# Patient Record
Sex: Male | Born: 1945 | Race: White | Hispanic: No | Marital: Married | Smoking: Former smoker
Health system: Southern US, Community
[De-identification: ages and names within clinical notes are randomized; demographics above are authoritative.]

## PROBLEM LIST (undated history)

## (undated) DIAGNOSIS — J189 Pneumonia, unspecified organism: Secondary | ICD-10-CM

## (undated) DIAGNOSIS — B159 Hepatitis A without hepatic coma: Secondary | ICD-10-CM

## (undated) DIAGNOSIS — C61 Malignant neoplasm of prostate: Secondary | ICD-10-CM

## (undated) DIAGNOSIS — E785 Hyperlipidemia, unspecified: Secondary | ICD-10-CM

## (undated) DIAGNOSIS — M1990 Unspecified osteoarthritis, unspecified site: Secondary | ICD-10-CM

## (undated) DIAGNOSIS — R0600 Dyspnea, unspecified: Secondary | ICD-10-CM

## (undated) DIAGNOSIS — I4819 Other persistent atrial fibrillation: Secondary | ICD-10-CM

## (undated) DIAGNOSIS — J111 Influenza due to unidentified influenza virus with other respiratory manifestations: Secondary | ICD-10-CM

## (undated) DIAGNOSIS — R001 Bradycardia, unspecified: Secondary | ICD-10-CM

## (undated) DIAGNOSIS — K219 Gastro-esophageal reflux disease without esophagitis: Secondary | ICD-10-CM

## (undated) DIAGNOSIS — Z950 Presence of cardiac pacemaker: Secondary | ICD-10-CM

## (undated) HISTORY — PX: APPENDECTOMY: SHX54

## (undated) HISTORY — DX: Other persistent atrial fibrillation: I48.19

## (undated) HISTORY — PX: WRIST FRACTURE SURGERY: SHX121

## (undated) HISTORY — DX: Hyperlipidemia, unspecified: E78.5

## (undated) HISTORY — PX: FRACTURE SURGERY: SHX138

---

## 2005-07-19 ENCOUNTER — Inpatient Hospital Stay (HOSPITAL_COMMUNITY): Admission: EM | Admit: 2005-07-19 | Discharge: 2005-07-20 | Payer: Self-pay | Admitting: Emergency Medicine

## 2005-07-19 IMAGING — CR DG CHEST 1V PORT
1 series · 1 of 1 positions shown · non-contrast
Comparison: None.

CLINICAL DATA: 60-year-old male with chest pain.  
 PORTABLE CHEST - 1 VIEW:

[view not recorded]
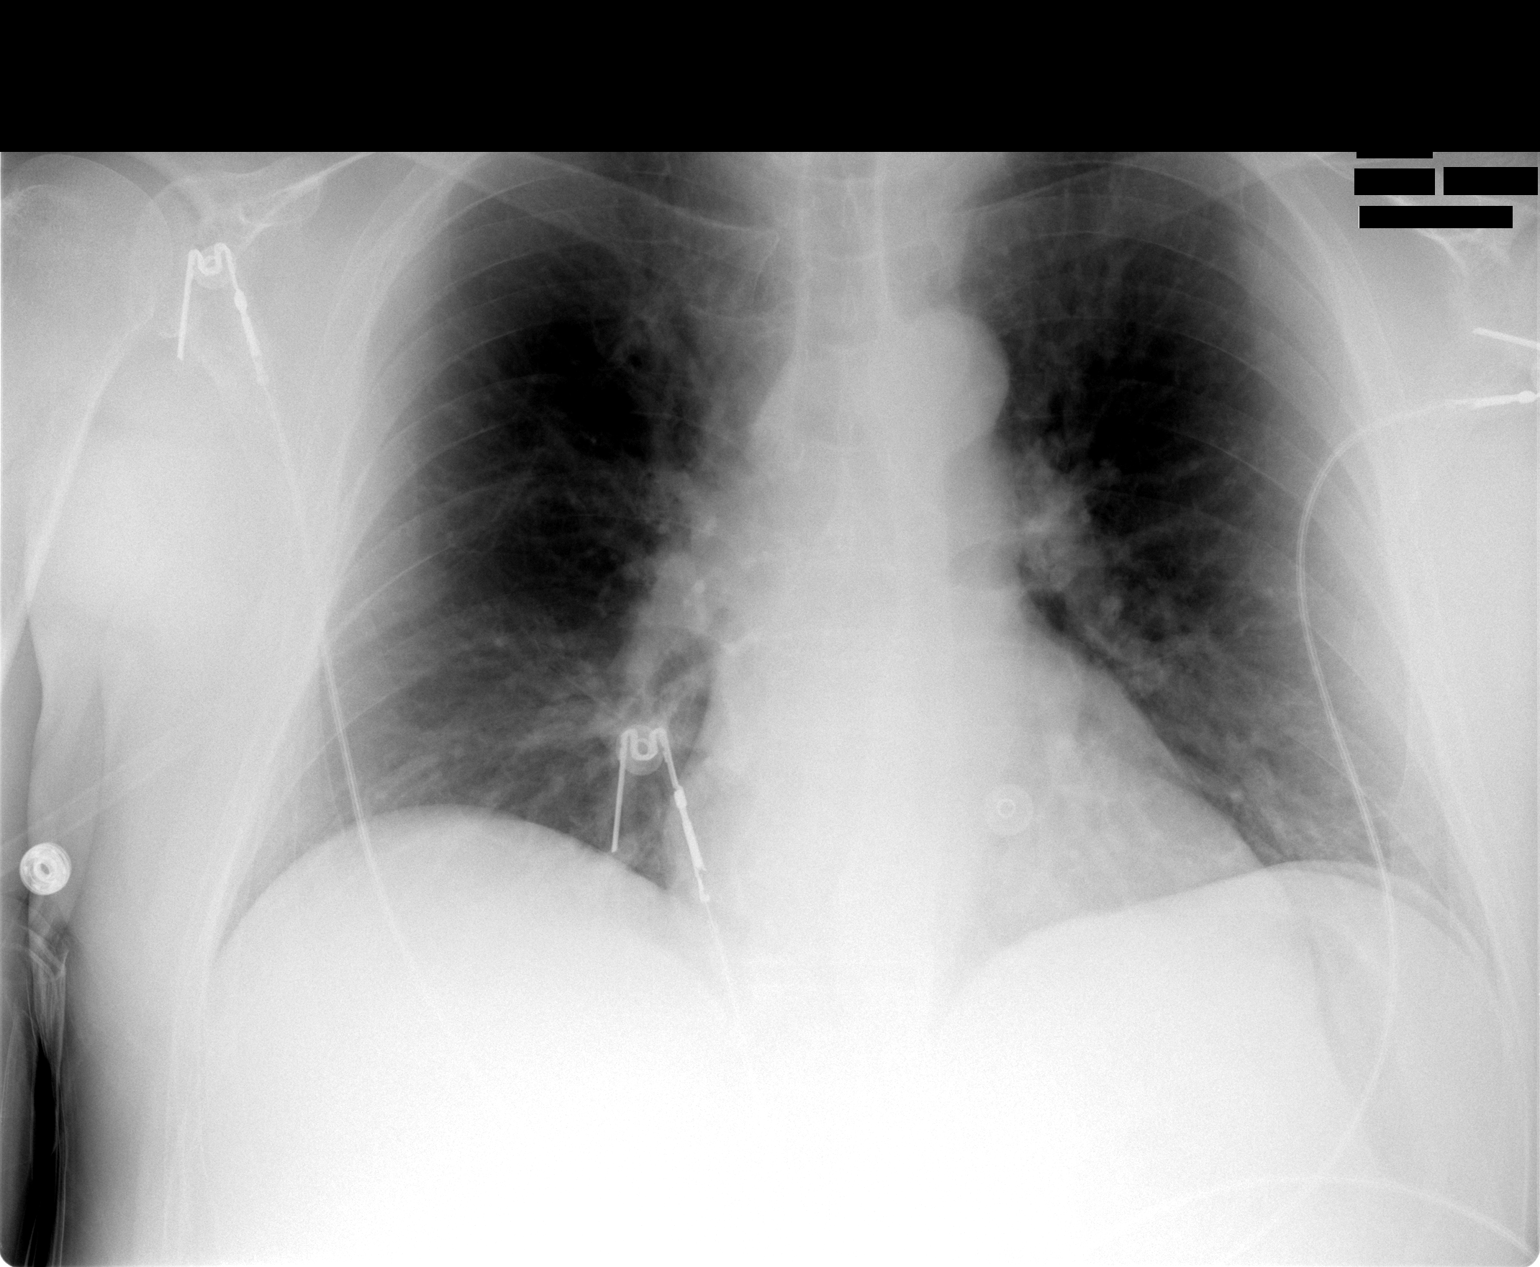

[1 of 1 positions shown; findings below may reference images not displayed]

FINDINGS: Cardiopericardial silhouette is within normal limits for size.  There is mild prominence of the pulmonary vasculature without frank edema.  No focal air space disease is seen.  Findings are exaggerated by low lung volumes.
IMPRESSION: 1.  No acute cardiopulmonary disease.
 2.  Mild cephalization of the pulmonary veins.  While this may represent early pulmonary vascular congestion, the findings are likely exaggerated by low lung volumes.

## 2005-07-20 IMAGING — CR DG CHEST 1V
1 series · 1 of 1 positions shown · non-contrast
Comparison: [DATE].

CLINICAL DATA: Myocardial infarction.  Post-V-Q scan. 
 CHEST ? 1 VIEW:

[view not recorded]
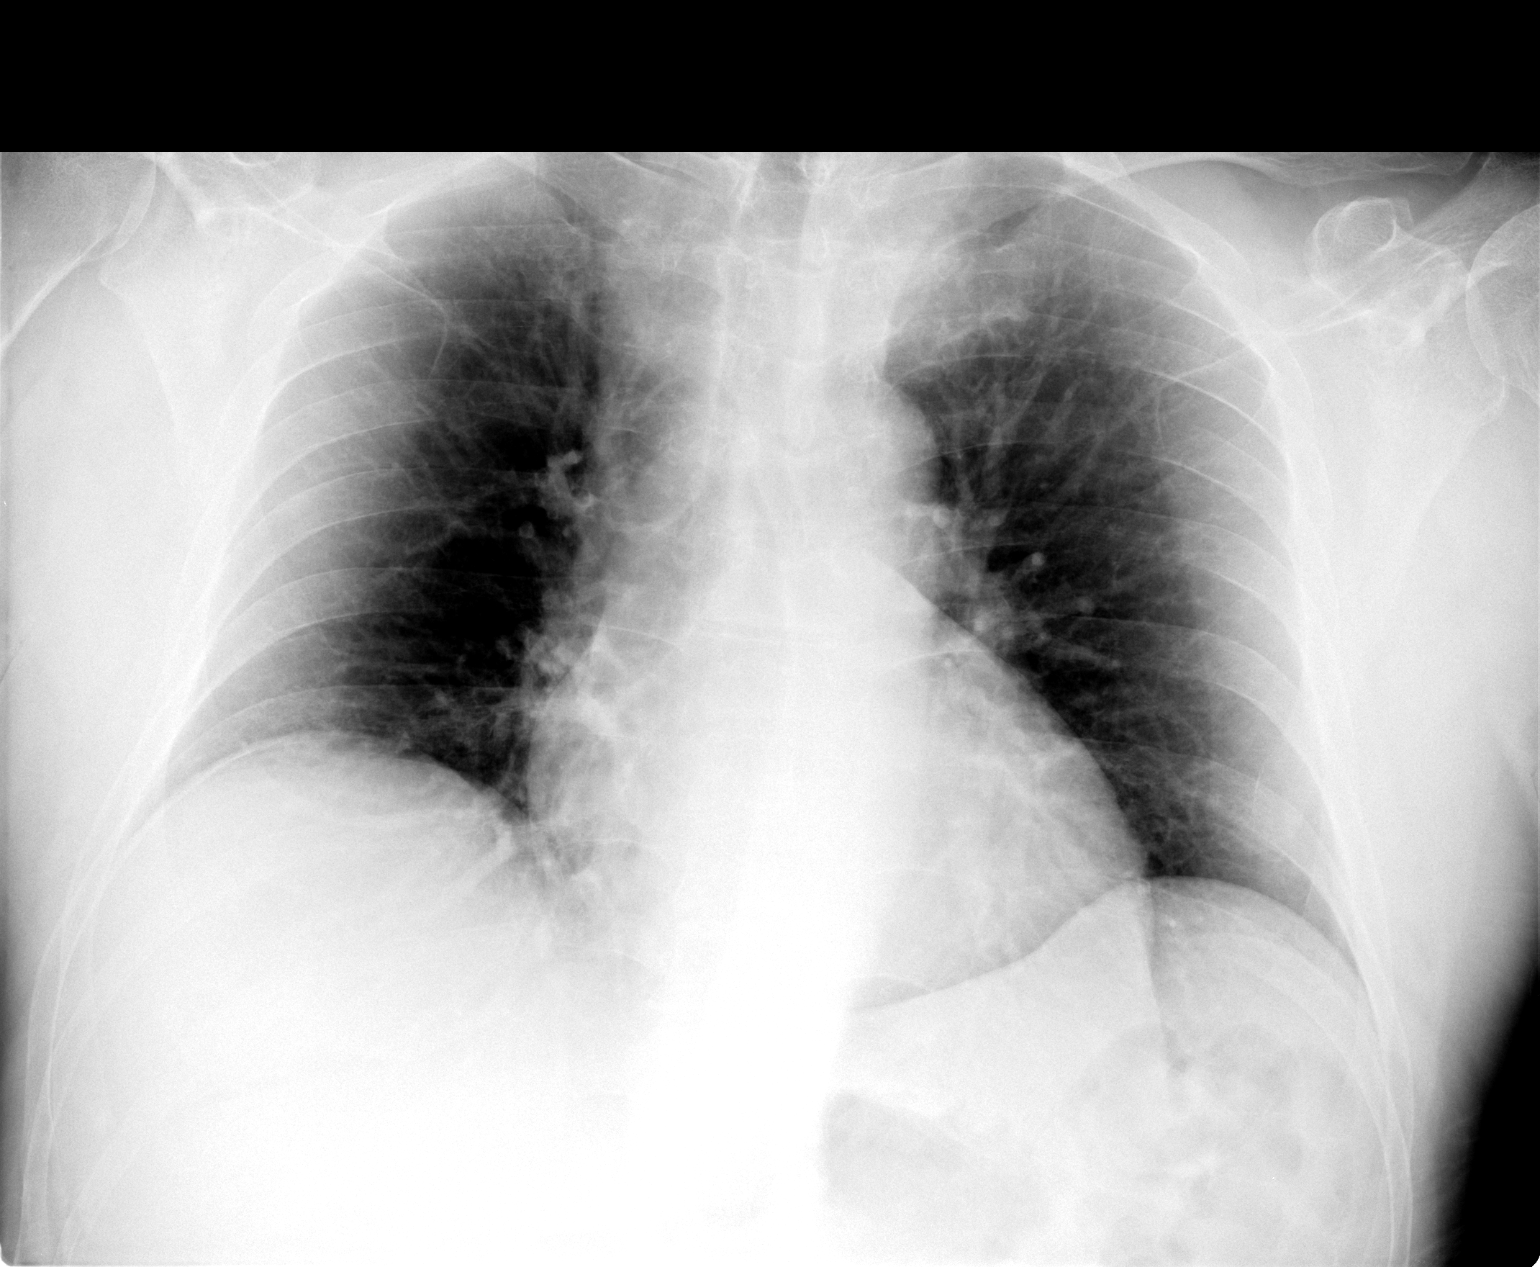

[1 of 1 positions shown; findings below may reference images not displayed]

FINDINGS: Heart size stable.  Lungs are low in volume but clear.
IMPRESSION: No acute findings.

## 2011-03-26 DIAGNOSIS — G4700 Insomnia, unspecified: Secondary | ICD-10-CM | POA: Diagnosis not present

## 2011-03-26 DIAGNOSIS — E7800 Pure hypercholesterolemia, unspecified: Secondary | ICD-10-CM | POA: Diagnosis not present

## 2011-03-26 DIAGNOSIS — K219 Gastro-esophageal reflux disease without esophagitis: Secondary | ICD-10-CM | POA: Diagnosis not present

## 2011-03-26 DIAGNOSIS — E559 Vitamin D deficiency, unspecified: Secondary | ICD-10-CM | POA: Diagnosis not present

## 2011-03-26 DIAGNOSIS — M1990 Unspecified osteoarthritis, unspecified site: Secondary | ICD-10-CM | POA: Diagnosis not present

## 2011-03-26 DIAGNOSIS — I8390 Asymptomatic varicose veins of unspecified lower extremity: Secondary | ICD-10-CM | POA: Diagnosis not present

## 2011-03-26 DIAGNOSIS — L989 Disorder of the skin and subcutaneous tissue, unspecified: Secondary | ICD-10-CM | POA: Diagnosis not present

## 2011-03-26 DIAGNOSIS — Z79899 Other long term (current) drug therapy: Secondary | ICD-10-CM | POA: Diagnosis not present

## 2011-03-26 DIAGNOSIS — L719 Rosacea, unspecified: Secondary | ICD-10-CM | POA: Diagnosis not present

## 2011-04-23 DIAGNOSIS — M2010 Hallux valgus (acquired), unspecified foot: Secondary | ICD-10-CM | POA: Diagnosis not present

## 2011-04-23 DIAGNOSIS — M2040 Other hammer toe(s) (acquired), unspecified foot: Secondary | ICD-10-CM | POA: Diagnosis not present

## 2011-05-04 DIAGNOSIS — H1130 Conjunctival hemorrhage, unspecified eye: Secondary | ICD-10-CM | POA: Diagnosis not present

## 2011-06-01 DIAGNOSIS — R062 Wheezing: Secondary | ICD-10-CM | POA: Diagnosis not present

## 2011-08-25 DIAGNOSIS — L578 Other skin changes due to chronic exposure to nonionizing radiation: Secondary | ICD-10-CM | POA: Diagnosis not present

## 2011-08-25 DIAGNOSIS — D2370 Other benign neoplasm of skin of unspecified lower limb, including hip: Secondary | ICD-10-CM | POA: Diagnosis not present

## 2011-08-25 DIAGNOSIS — L910 Hypertrophic scar: Secondary | ICD-10-CM | POA: Diagnosis not present

## 2011-08-25 DIAGNOSIS — L821 Other seborrheic keratosis: Secondary | ICD-10-CM | POA: Diagnosis not present

## 2011-08-25 DIAGNOSIS — L820 Inflamed seborrheic keratosis: Secondary | ICD-10-CM | POA: Diagnosis not present

## 2011-08-25 DIAGNOSIS — D485 Neoplasm of uncertain behavior of skin: Secondary | ICD-10-CM | POA: Diagnosis not present

## 2011-08-25 DIAGNOSIS — D239 Other benign neoplasm of skin, unspecified: Secondary | ICD-10-CM | POA: Diagnosis not present

## 2012-01-12 DIAGNOSIS — D234 Other benign neoplasm of skin of scalp and neck: Secondary | ICD-10-CM | POA: Diagnosis not present

## 2012-01-12 DIAGNOSIS — D485 Neoplasm of uncertain behavior of skin: Secondary | ICD-10-CM | POA: Diagnosis not present

## 2012-01-12 DIAGNOSIS — L909 Atrophic disorder of skin, unspecified: Secondary | ICD-10-CM | POA: Diagnosis not present

## 2012-03-31 DIAGNOSIS — I8390 Asymptomatic varicose veins of unspecified lower extremity: Secondary | ICD-10-CM | POA: Diagnosis not present

## 2012-03-31 DIAGNOSIS — E7800 Pure hypercholesterolemia, unspecified: Secondary | ICD-10-CM | POA: Diagnosis not present

## 2012-03-31 DIAGNOSIS — M1990 Unspecified osteoarthritis, unspecified site: Secondary | ICD-10-CM | POA: Diagnosis not present

## 2012-03-31 DIAGNOSIS — Z79899 Other long term (current) drug therapy: Secondary | ICD-10-CM | POA: Diagnosis not present

## 2012-03-31 DIAGNOSIS — L719 Rosacea, unspecified: Secondary | ICD-10-CM | POA: Diagnosis not present

## 2012-03-31 DIAGNOSIS — Z0000 Encounter for general adult medical examination without abnormal findings: Secondary | ICD-10-CM | POA: Diagnosis not present

## 2012-03-31 DIAGNOSIS — E559 Vitamin D deficiency, unspecified: Secondary | ICD-10-CM | POA: Diagnosis not present

## 2012-03-31 DIAGNOSIS — Z125 Encounter for screening for malignant neoplasm of prostate: Secondary | ICD-10-CM | POA: Diagnosis not present

## 2012-03-31 DIAGNOSIS — Z1331 Encounter for screening for depression: Secondary | ICD-10-CM | POA: Diagnosis not present

## 2012-04-18 DIAGNOSIS — M1710 Unilateral primary osteoarthritis, unspecified knee: Secondary | ICD-10-CM | POA: Diagnosis not present

## 2012-04-18 DIAGNOSIS — M5137 Other intervertebral disc degeneration, lumbosacral region: Secondary | ICD-10-CM | POA: Diagnosis not present

## 2012-05-18 DIAGNOSIS — M1710 Unilateral primary osteoarthritis, unspecified knee: Secondary | ICD-10-CM | POA: Diagnosis not present

## 2012-06-07 DIAGNOSIS — M1710 Unilateral primary osteoarthritis, unspecified knee: Secondary | ICD-10-CM | POA: Diagnosis not present

## 2012-08-10 DIAGNOSIS — IMO0002 Reserved for concepts with insufficient information to code with codable children: Secondary | ICD-10-CM | POA: Diagnosis not present

## 2012-08-23 DIAGNOSIS — L821 Other seborrheic keratosis: Secondary | ICD-10-CM | POA: Diagnosis not present

## 2012-08-23 DIAGNOSIS — L578 Other skin changes due to chronic exposure to nonionizing radiation: Secondary | ICD-10-CM | POA: Diagnosis not present

## 2012-08-23 DIAGNOSIS — L819 Disorder of pigmentation, unspecified: Secondary | ICD-10-CM | POA: Diagnosis not present

## 2012-08-23 DIAGNOSIS — D485 Neoplasm of uncertain behavior of skin: Secondary | ICD-10-CM | POA: Diagnosis not present

## 2012-08-23 DIAGNOSIS — D1801 Hemangioma of skin and subcutaneous tissue: Secondary | ICD-10-CM | POA: Diagnosis not present

## 2012-08-23 DIAGNOSIS — L723 Sebaceous cyst: Secondary | ICD-10-CM | POA: Diagnosis not present

## 2012-11-29 ENCOUNTER — Encounter (HOSPITAL_COMMUNITY): Payer: Self-pay | Admitting: *Deleted

## 2012-11-29 ENCOUNTER — Other Ambulatory Visit: Payer: Self-pay | Admitting: Gastroenterology

## 2012-12-01 ENCOUNTER — Encounter (HOSPITAL_COMMUNITY): Payer: Self-pay | Admitting: Pharmacy Technician

## 2012-12-13 ENCOUNTER — Encounter (HOSPITAL_COMMUNITY): Payer: Self-pay | Admitting: Anesthesiology

## 2012-12-13 ENCOUNTER — Ambulatory Visit (HOSPITAL_COMMUNITY)
Admission: RE | Admit: 2012-12-13 | Discharge: 2012-12-13 | Disposition: A | Discharge status: Home / Self Care | Payer: Medicare Other | Source: Ambulatory Visit | Attending: Gastroenterology | Admitting: Gastroenterology

## 2012-12-13 ENCOUNTER — Encounter (HOSPITAL_COMMUNITY)
Admission: RE | Disposition: A | Discharge status: Home / Self Care | Payer: Self-pay | Source: Ambulatory Visit | Attending: Gastroenterology

## 2012-12-13 ENCOUNTER — Ambulatory Visit (HOSPITAL_COMMUNITY): Payer: Medicare Other | Admitting: Anesthesiology

## 2012-12-13 DIAGNOSIS — E7800 Pure hypercholesterolemia, unspecified: Secondary | ICD-10-CM | POA: Diagnosis not present

## 2012-12-13 DIAGNOSIS — K5730 Diverticulosis of large intestine without perforation or abscess without bleeding: Secondary | ICD-10-CM | POA: Insufficient documentation

## 2012-12-13 DIAGNOSIS — Z09 Encounter for follow-up examination after completed treatment for conditions other than malignant neoplasm: Secondary | ICD-10-CM | POA: Diagnosis not present

## 2012-12-13 DIAGNOSIS — K5732 Diverticulitis of large intestine without perforation or abscess without bleeding: Secondary | ICD-10-CM | POA: Diagnosis not present

## 2012-12-13 DIAGNOSIS — Z860100 Personal history of colon polyps, unspecified: Secondary | ICD-10-CM | POA: Insufficient documentation

## 2012-12-13 DIAGNOSIS — Z1211 Encounter for screening for malignant neoplasm of colon: Secondary | ICD-10-CM | POA: Diagnosis not present

## 2012-12-13 DIAGNOSIS — K219 Gastro-esophageal reflux disease without esophagitis: Secondary | ICD-10-CM | POA: Insufficient documentation

## 2012-12-13 HISTORY — PX: COLONOSCOPY WITH PROPOFOL: SHX5780

## 2012-12-13 HISTORY — DX: Unspecified osteoarthritis, unspecified site: M19.90

## 2012-12-13 HISTORY — DX: Gastro-esophageal reflux disease without esophagitis: K21.9

## 2012-12-13 SURGERY — COLONOSCOPY WITH PROPOFOL
Anesthesia: Monitor Anesthesia Care

## 2012-12-13 MED ORDER — PROPOFOL INFUSION 10 MG/ML OPTIME
INTRAVENOUS | Status: DC | PRN
  Administered 2012-12-13: 140 ug/kg/min via INTRAVENOUS

## 2012-12-13 MED ORDER — FENTANYL CITRATE 0.05 MG/ML IJ SOLN
INTRAMUSCULAR | Status: DC | PRN
  Administered 2012-12-13 (×2): 50 ug via INTRAVENOUS

## 2012-12-13 MED ORDER — LACTATED RINGERS IV SOLN
INTRAVENOUS | Status: DC
  Administered 2012-12-13: 1000 mL via INTRAVENOUS

## 2012-12-13 MED ORDER — SODIUM CHLORIDE 0.9 % IV SOLN: INTRAVENOUS | Status: DC

## 2012-12-13 MED ORDER — KETAMINE HCL 10 MG/ML IJ SOLN
INTRAMUSCULAR | Status: DC | PRN
  Administered 2012-12-13: 20 mg via INTRAVENOUS

## 2012-12-13 MED ORDER — MIDAZOLAM HCL 5 MG/5ML IJ SOLN
INTRAMUSCULAR | Status: DC | PRN
  Administered 2012-12-13 (×2): 1 mg via INTRAVENOUS

## 2012-12-13 SURGICAL SUPPLY — 22 items

## 2012-12-13 NOTE — Op Note (Signed)
Procedure: Surveillance colonoscopy  Endoscopist: Danise Edge  Premedication: Propofol administered by anesthesia  Procedure: The patient was placed in the left lateral decubitus position. Anal inspection and digital rectal exam were normal. The Pentax pediatric colonoscope was introduced into the rectum and advanced to the cecum. A normal-appearing appendiceal orifice and ileocecal valve were identified. Colonic preparation for the exam today was good.  Rectum. Normal. Retroflex view of the distal rectum normal.  Sigmoid colon and descending colon. Left colonic diverticulosis  Splenic flexure. Normal.  Transverse colon. Normal.  Hepatic flexure. Normal.  Ascending colon. Normal.  Cecum and ileocecal valve. Normal.  Assessment: Normal surveillance proctocolonoscopy to the cecum  Recommendation: Schedule surveillance colonoscopy in 5 years.

## 2012-12-13 NOTE — Preoperative (Signed)
Beta Blockers   Reason not to administer Beta Blockers:Not Applicable 

## 2012-12-13 NOTE — Transfer of Care (Signed)
Immediate Anesthesia Transfer of Care Note  Patient: Todd Chapman Drilling  Procedure(s) Performed: Procedure(s): COLONOSCOPY WITH PROPOFOL (N/A)  Patient Location: PACU  Anesthesia Type:MAC  Level of Consciousness: sedated  Airway & Oxygen Therapy: Patient Spontanous Breathing and Patient connected to face mask oxygen  Post-op Assessment: Report given to PACU RN and Post -op Vital signs reviewed and stable  Post vital signs: Reviewed and stable  Complications: No apparent anesthesia complications

## 2012-12-13 NOTE — Anesthesia Preprocedure Evaluation (Addendum)
Anesthesia Evaluation  Patient identified by MRN, date of birth, ID band Patient awake    Reviewed: Allergy & Precautions, H&P , NPO status , Patient's Chart, lab work & pertinent test results  Airway Mallampati: II TM Distance: >3 FB Neck ROM: Full    Dental  (+) Dental Advisory Given and Teeth Intact   Pulmonary neg pulmonary ROS,  breath sounds clear to auscultation        Cardiovascular negative cardio ROS  Rhythm:Regular Rate:Normal     Neuro/Psych negative neurological ROS  negative psych ROS   GI/Hepatic Neg liver ROS, GERD-  Medicated,  Endo/Other  negative endocrine ROS  Renal/GU negative Renal ROS     Musculoskeletal negative musculoskeletal ROS (+)   Abdominal   Peds  Hematology negative hematology ROS (+)   Anesthesia Other Findings   Reproductive/Obstetrics negative OB ROS                          Anesthesia Physical Anesthesia Plan  ASA: II  Anesthesia Plan: MAC   Post-op Pain Management:    Induction: Intravenous  Airway Management Planned:   Additional Equipment:   Intra-op Plan:   Post-operative Plan:   Informed Consent: I have reviewed the patients History and Physical, chart, labs and discussed the procedure including the risks, benefits and alternatives for the proposed anesthesia with the patient or authorized representative who has indicated his/her understanding and acceptance.   Dental advisory given  Plan Discussed with: CRNA  Anesthesia Plan Comments:         Anesthesia Quick Evaluation

## 2012-12-13 NOTE — H&P (Signed)
  Problem: Surveillance colonoscopy. Adenomatous colon polyp removed in 2006. Normal surveillance colonoscopy in 2009.  History: The patient is a 67 year old male born Jun 13, 1945. He underwent a colonoscopy in 2006 with removal of a tubular adenomatous colon polyp. He underwent a normal surveillance colonoscopy in 2009.  The patient is scheduled to undergo a surveillance colonoscopy today.  Medication allergies: Lipitor causes diarrhea. Zyrtec causes myalgias.  Past medical history: Hypercholesterolemia. Chronic tinnitus. Degenerative joint disease of the knees. Rosacea. Colonic diverticulosis. Melanosis coli. Gastroesophageal reflux. Chronic insomnia. History of adenomatous colon polyps. Appendectomy. Left wrist surgery.  Exam: The patient is alert and lying comfortably on the endoscopy stretcher. Abdomen is soft and nontender to palpation. Lungs are clear to auscultation. Cardiac exam reveals a regular rhythm.  Plan: Proceed with surveillance colonoscopy using propofol sedation.

## 2012-12-13 NOTE — Anesthesia Postprocedure Evaluation (Signed)
Anesthesia Post Note  Patient: Todd Chapman  Procedure(s) Performed: Procedure(s) (LRB): COLONOSCOPY WITH PROPOFOL (N/A)  Anesthesia type: MAC  Patient location: PACU  Post pain: Pain level controlled  Post assessment: Post-op Vital signs reviewed  Last Vitals: BP 97/54  Pulse 57  Temp(Src) 36.9 C (Oral)  Resp 16  Ht 6\' 1"  (1.854 m)  Wt 214 lb (97.07 kg)  BMI 28.24 kg/m2  SpO2 99%  Post vital signs: Reviewed  Level of consciousness: awake  Complications: No apparent anesthesia complications

## 2012-12-14 ENCOUNTER — Encounter (HOSPITAL_COMMUNITY): Payer: Self-pay | Admitting: Gastroenterology

## 2013-04-06 DIAGNOSIS — J069 Acute upper respiratory infection, unspecified: Secondary | ICD-10-CM | POA: Diagnosis not present

## 2013-04-13 DIAGNOSIS — L821 Other seborrheic keratosis: Secondary | ICD-10-CM | POA: Diagnosis not present

## 2013-04-17 DIAGNOSIS — E559 Vitamin D deficiency, unspecified: Secondary | ICD-10-CM | POA: Diagnosis not present

## 2013-04-17 DIAGNOSIS — L719 Rosacea, unspecified: Secondary | ICD-10-CM | POA: Diagnosis not present

## 2013-04-17 DIAGNOSIS — Z1331 Encounter for screening for depression: Secondary | ICD-10-CM | POA: Diagnosis not present

## 2013-04-17 DIAGNOSIS — I8390 Asymptomatic varicose veins of unspecified lower extremity: Secondary | ICD-10-CM | POA: Diagnosis not present

## 2013-04-17 DIAGNOSIS — Z0000 Encounter for general adult medical examination without abnormal findings: Secondary | ICD-10-CM | POA: Diagnosis not present

## 2013-04-17 DIAGNOSIS — Z125 Encounter for screening for malignant neoplasm of prostate: Secondary | ICD-10-CM | POA: Diagnosis not present

## 2013-04-17 DIAGNOSIS — J40 Bronchitis, not specified as acute or chronic: Secondary | ICD-10-CM | POA: Diagnosis not present

## 2013-04-17 DIAGNOSIS — Z79899 Other long term (current) drug therapy: Secondary | ICD-10-CM | POA: Diagnosis not present

## 2013-04-17 DIAGNOSIS — M1990 Unspecified osteoarthritis, unspecified site: Secondary | ICD-10-CM | POA: Diagnosis not present

## 2013-04-17 DIAGNOSIS — E7800 Pure hypercholesterolemia, unspecified: Secondary | ICD-10-CM | POA: Diagnosis not present

## 2013-04-17 DIAGNOSIS — Z23 Encounter for immunization: Secondary | ICD-10-CM | POA: Diagnosis not present

## 2013-04-17 DIAGNOSIS — K219 Gastro-esophageal reflux disease without esophagitis: Secondary | ICD-10-CM | POA: Diagnosis not present

## 2013-08-29 DIAGNOSIS — D239 Other benign neoplasm of skin, unspecified: Secondary | ICD-10-CM | POA: Diagnosis not present

## 2013-08-29 DIAGNOSIS — L821 Other seborrheic keratosis: Secondary | ICD-10-CM | POA: Diagnosis not present

## 2013-08-29 DIAGNOSIS — D2370 Other benign neoplasm of skin of unspecified lower limb, including hip: Secondary | ICD-10-CM | POA: Diagnosis not present

## 2013-08-29 DIAGNOSIS — D1801 Hemangioma of skin and subcutaneous tissue: Secondary | ICD-10-CM | POA: Diagnosis not present

## 2013-08-29 DIAGNOSIS — D485 Neoplasm of uncertain behavior of skin: Secondary | ICD-10-CM | POA: Diagnosis not present

## 2013-08-29 DIAGNOSIS — L570 Actinic keratosis: Secondary | ICD-10-CM | POA: Diagnosis not present

## 2013-08-29 DIAGNOSIS — I789 Disease of capillaries, unspecified: Secondary | ICD-10-CM | POA: Diagnosis not present

## 2013-09-14 DIAGNOSIS — J40 Bronchitis, not specified as acute or chronic: Secondary | ICD-10-CM | POA: Diagnosis not present

## 2014-04-24 DIAGNOSIS — E559 Vitamin D deficiency, unspecified: Secondary | ICD-10-CM | POA: Insufficient documentation

## 2014-04-24 DIAGNOSIS — Z86010 Personal history of colonic polyps: Secondary | ICD-10-CM | POA: Diagnosis not present

## 2014-04-24 DIAGNOSIS — Z0001 Encounter for general adult medical examination with abnormal findings: Secondary | ICD-10-CM | POA: Insufficient documentation

## 2014-04-24 DIAGNOSIS — K5730 Diverticulosis of large intestine without perforation or abscess without bleeding: Secondary | ICD-10-CM | POA: Diagnosis not present

## 2014-04-24 DIAGNOSIS — I8390 Asymptomatic varicose veins of unspecified lower extremity: Secondary | ICD-10-CM | POA: Diagnosis not present

## 2014-04-24 DIAGNOSIS — Z79899 Other long term (current) drug therapy: Secondary | ICD-10-CM | POA: Diagnosis not present

## 2014-04-24 DIAGNOSIS — K219 Gastro-esophageal reflux disease without esophagitis: Secondary | ICD-10-CM | POA: Diagnosis not present

## 2014-04-24 DIAGNOSIS — Z1389 Encounter for screening for other disorder: Secondary | ICD-10-CM | POA: Diagnosis not present

## 2014-04-24 DIAGNOSIS — E785 Hyperlipidemia, unspecified: Secondary | ICD-10-CM | POA: Diagnosis not present

## 2014-04-24 DIAGNOSIS — R001 Bradycardia, unspecified: Secondary | ICD-10-CM | POA: Diagnosis not present

## 2014-04-24 DIAGNOSIS — H269 Unspecified cataract: Secondary | ICD-10-CM | POA: Diagnosis not present

## 2014-04-24 DIAGNOSIS — Z23 Encounter for immunization: Secondary | ICD-10-CM | POA: Diagnosis not present

## 2014-04-24 HISTORY — DX: Vitamin D deficiency, unspecified: E55.9

## 2014-05-28 ENCOUNTER — Encounter: Payer: Self-pay | Admitting: *Deleted

## 2014-05-29 ENCOUNTER — Other Ambulatory Visit: Payer: Self-pay | Admitting: *Deleted

## 2014-05-29 ENCOUNTER — Ambulatory Visit (INDEPENDENT_AMBULATORY_CARE_PROVIDER_SITE_OTHER): Payer: Medicare Other | Admitting: Cardiology

## 2014-05-29 ENCOUNTER — Encounter: Payer: Self-pay | Admitting: Cardiology

## 2014-05-29 VITALS — BP 118/76 | HR 78 | Ht 73.0 in | Wt 218.0 lb

## 2014-05-29 DIAGNOSIS — E785 Hyperlipidemia, unspecified: Secondary | ICD-10-CM | POA: Insufficient documentation

## 2014-05-29 DIAGNOSIS — I491 Atrial premature depolarization: Secondary | ICD-10-CM

## 2014-05-29 DIAGNOSIS — I492 Junctional premature depolarization: Secondary | ICD-10-CM | POA: Insufficient documentation

## 2014-05-29 DIAGNOSIS — R001 Bradycardia, unspecified: Secondary | ICD-10-CM | POA: Diagnosis not present

## 2014-05-29 NOTE — Patient Instructions (Signed)
The current medical regimen is effective;  continue present plan and medications.  Your physician has requested that you have an echocardiogram. Echocardiography is a painless test that uses sound waves to create images of your heart. It provides your doctor with information about the size and shape of your heart and how well your heart's chambers and valves are working. This procedure takes approximately one hour. There are no restrictions for this procedure.  Further follow up will be based on these results.  Thank you for choosing [facility]!!

## 2014-05-29 NOTE — Progress Notes (Signed)
Cardiology Office Note   Date:  05/29/2014   ID:  CAMDAN BURDI, DOB 1945-09-30, MRN [id number]  PCP:  Henrine Screws, MD  Cardiologist:   Candee Furbish, MD   Here for the evaluation of bradycardia    History of Present Illness: Todd Chapman is a 69 y.o. male who presents for evaluation of bradycardia. There was thought of possible junctional rhythm. Mother had a pacemaker area he's been referred for further evaluation. TSH has been done and was normal.  Upon inspection of EKG, there does appear to be P wave activity albeit small amplitude seen in V4, second to last beat. There is also a possible P wave is well in lead 1, second beat. He does have the appearance of group beating and this is likely secondary to premature atrial contraction/atrial trigeminy or perhaps premature junctional contraction. There is baseline artifact in this EKG. His subsequent EKG which he had with him showed sinus bradycardia and clear P-wave activity with heart rate of 53 bpm. Intervals were normal. He is on no AV nodal blocking agents.  He is had no symptoms, no shortness of breath, no chest pain, no syncope. Overall doing well. He has been working full time as an Forensic psychologist. He would like to continue to work for at least another 5 years. Enjoys.  Friends with Dr. Lindwood Coke and [place].   He is married, wife survived breast cancer and had a stem cell transplant several years ago. He never smoked. Rare alcohol. Working as an Museum/gallery exhibitions officer.    Past Medical History  Diagnosis Date  . GERD (gastroesophageal reflux disease)   . Arthritis     Past Surgical History  Procedure Laterality Date  . Appendectomy      age 89  . Wrist fracture surgery      left-with pins  . Colonoscopy with propofol N/A 12/13/2012    Procedure: COLONOSCOPY WITH PROPOFOL;  Surgeon: Garlan Fair, MD;  Location: [facility];  Service: Endoscopy;  Laterality: N/A;     Current Outpatient Prescriptions    Medication Sig Dispense Refill  . aspirin EC 81 MG tablet Take 81 mg by mouth daily.    Marland Kitchen esomeprazole (NEXIUM) 40 MG capsule Take 40 mg by mouth daily before breakfast.    . Glucosamine-Chondroit-Vit C-Mn (GLUCOSAMINE CHONDR 1500 COMPLX PO) Take by mouth.    Marland Kitchen ibuprofen (ADVIL,MOTRIN) 200 MG tablet Take 400 mg by mouth every 6 (six) hours as needed for pain.    Marland Kitchen lansoprazole (PREVACID) 15 MG capsule Take 15 mg by mouth daily.    . Multiple Vitamin (MULTIVITAMIN WITH MINERALS) TABS tablet Take 1 tablet by mouth daily.    . rosuvastatin (CRESTOR) 10 MG tablet Take 10 mg by mouth every morning.     No current facility-administered medications for this visit.    Allergies:   Atorvastatin and Ezetimibe    Social History:  The patient  reports that he has quit smoking. He quit smokeless tobacco use about 48 years ago. He reports that he drinks alcohol. He reports that he does not use illicit drugs.   Family History:  The patient's family history includes CVA in his mother; Coronary artery disease in his father and mother; Heart attack in his father; Stroke in his mother.    ROS:  Please see the history of present illness.   Otherwise, review of systems are positive for none.   All other systems are reviewed and negative.    PHYSICAL EXAM:  VS:  BP 118/76 mmHg  Pulse 78  Ht 6\' 1"  (1.854 m)  Wt 218 lb (98.884 kg)  BMI 28.77 kg/m2 , BMI Body mass index is 28.77 kg/(m^2). GEN: Well nourished, well developed, in no acute distress HEENT: normal Neck: no JVD, carotid bruits, or masses Cardiac: Bradycardic regular rhythm with occasional ectopy; no murmurs, rubs, or gallops,no edema  Respiratory:  clear to auscultation bilaterally, normal work of breathing GI: soft, nontender, nondistended, + BS MS: no deformity or atrophy Skin: warm and dry, no rash, no edema Neuro:  Strength and sensation are intact Psych: euthymic mood, full affect   EKG:  EKG is not ordered today. EKG as explained  above. Repeat reviewed.   Recent Labs: No results found for requested labs within last 365 days.  Dr. Inda Merlin performed an extensive battery of lab testing including TSH.  Lipid Panel No results found for: CHOL, TRIG, HDL, CHOLHDL, VLDL, LDLCALC, LDLDIRECT    Wt Readings from Last 3 Encounters:  05/29/14 218 lb (98.884 kg)  11/29/12 214 lb (97.07 kg)      Other studies Reviewed: Additional studies/ records that were reviewed today include: Review of Dr. Inda Merlin notes. Review of the above records demonstrates: As described above.   ASSESSMENT AND PLAN:  1.  Bradycardia/PAC/premature junctional beats-asymptomatic. No shortness of breath, no syncope, no anginal symptoms. He does have a family history of conduction disorder with his mother having a pacemaker. Overall, I do believe that he is having either premature atrial contractions or premature junctional contractions on his EKG from 04/24/14 at 922. Repeat EKG at 929 shows clear P-wave activity preceding each QRS complex at 53 bpm. At this point, I will check an echocardiogram to ensure proper structure and function of his heart. I do not believe that any further cardiac workup is necessary at this time. If symptoms change or become more worrisome, he will let me know. Avoid AV nodal blocking agents. No indication for pacemaker.  2. Hyperlipidemia-currently taking Crestor. Excellent.  3. Primary prevention-aspirin 81 mg. Statin treatment. Exercise.   Current medicines are reviewed at length with the patient today.  The patient does not have concerns regarding medicines.  The following changes have been made:  no change  Labs/ tests ordered today include:   Orders Placed This Encounter  Procedures  . 2D Echocardiogram without contrast     Disposition:   Follow-up with results of echocardiogram. If symptoms change, he may see me back on as-needed basis.  Bobby Rumpf, MD  05/29/2014 11:48 AM    [facility] [address], [place], [place]  [address] Phone: [phone]; Fax: [phone]

## 2014-06-20 ENCOUNTER — Ambulatory Visit (HOSPITAL_COMMUNITY): Payer: Medicare Other | Attending: Cardiology | Admitting: Radiology

## 2014-06-20 DIAGNOSIS — R001 Bradycardia, unspecified: Secondary | ICD-10-CM | POA: Insufficient documentation

## 2014-06-20 NOTE — Progress Notes (Signed)
Echocardiogram performed.  

## 2014-06-25 ENCOUNTER — Telehealth: Payer: Self-pay | Admitting: Cardiology

## 2014-06-25 NOTE — Telephone Encounter (Signed)
Reviewed results of echo with pt.

## 2014-06-25 NOTE — Telephone Encounter (Signed)
Pt calling to get results of echo-my chart sent message that he had results although when he opened it no results where there--he came out of a trial to make this call and needs the results to be put om mychart

## 2014-07-25 DIAGNOSIS — H02831 Dermatochalasis of right upper eyelid: Secondary | ICD-10-CM | POA: Insufficient documentation

## 2014-07-25 DIAGNOSIS — H2513 Age-related nuclear cataract, bilateral: Secondary | ICD-10-CM | POA: Diagnosis not present

## 2014-07-25 DIAGNOSIS — H02834 Dermatochalasis of left upper eyelid: Secondary | ICD-10-CM | POA: Diagnosis not present

## 2014-07-25 DIAGNOSIS — H35363 Drusen (degenerative) of macula, bilateral: Secondary | ICD-10-CM | POA: Diagnosis not present

## 2014-08-28 DIAGNOSIS — D1801 Hemangioma of skin and subcutaneous tissue: Secondary | ICD-10-CM | POA: Diagnosis not present

## 2014-08-28 DIAGNOSIS — L111 Transient acantholytic dermatosis [Grover]: Secondary | ICD-10-CM | POA: Diagnosis not present

## 2014-08-28 DIAGNOSIS — L570 Actinic keratosis: Secondary | ICD-10-CM | POA: Diagnosis not present

## 2014-08-28 DIAGNOSIS — L718 Other rosacea: Secondary | ICD-10-CM | POA: Diagnosis not present

## 2014-08-28 DIAGNOSIS — D225 Melanocytic nevi of trunk: Secondary | ICD-10-CM | POA: Diagnosis not present

## 2014-08-28 DIAGNOSIS — I788 Other diseases of capillaries: Secondary | ICD-10-CM | POA: Diagnosis not present

## 2014-09-03 ENCOUNTER — Other Ambulatory Visit: Payer: Self-pay

## 2015-01-04 DIAGNOSIS — Z23 Encounter for immunization: Secondary | ICD-10-CM | POA: Diagnosis not present

## 2015-01-04 DIAGNOSIS — M461 Sacroiliitis, not elsewhere classified: Secondary | ICD-10-CM | POA: Diagnosis not present

## 2015-01-21 DIAGNOSIS — J329 Chronic sinusitis, unspecified: Secondary | ICD-10-CM | POA: Diagnosis not present

## 2015-07-08 DIAGNOSIS — J309 Allergic rhinitis, unspecified: Secondary | ICD-10-CM | POA: Diagnosis not present

## 2015-07-08 DIAGNOSIS — J069 Acute upper respiratory infection, unspecified: Secondary | ICD-10-CM | POA: Diagnosis not present

## 2015-08-14 DIAGNOSIS — K219 Gastro-esophageal reflux disease without esophagitis: Secondary | ICD-10-CM | POA: Diagnosis not present

## 2015-08-14 DIAGNOSIS — Z79899 Other long term (current) drug therapy: Secondary | ICD-10-CM | POA: Diagnosis not present

## 2015-08-14 DIAGNOSIS — Z0001 Encounter for general adult medical examination with abnormal findings: Secondary | ICD-10-CM | POA: Diagnosis not present

## 2015-08-14 DIAGNOSIS — J309 Allergic rhinitis, unspecified: Secondary | ICD-10-CM | POA: Diagnosis not present

## 2015-08-14 DIAGNOSIS — G4700 Insomnia, unspecified: Secondary | ICD-10-CM | POA: Diagnosis not present

## 2015-08-14 DIAGNOSIS — Z86010 Personal history of colonic polyps: Secondary | ICD-10-CM | POA: Diagnosis not present

## 2015-08-14 DIAGNOSIS — M1990 Unspecified osteoarthritis, unspecified site: Secondary | ICD-10-CM | POA: Diagnosis not present

## 2015-08-14 DIAGNOSIS — E785 Hyperlipidemia, unspecified: Secondary | ICD-10-CM | POA: Diagnosis not present

## 2015-08-14 DIAGNOSIS — E559 Vitamin D deficiency, unspecified: Secondary | ICD-10-CM | POA: Diagnosis not present

## 2015-08-14 DIAGNOSIS — K5730 Diverticulosis of large intestine without perforation or abscess without bleeding: Secondary | ICD-10-CM | POA: Diagnosis not present

## 2015-08-14 DIAGNOSIS — Z1389 Encounter for screening for other disorder: Secondary | ICD-10-CM | POA: Diagnosis not present

## 2015-08-29 DIAGNOSIS — L821 Other seborrheic keratosis: Secondary | ICD-10-CM | POA: Diagnosis not present

## 2015-08-29 DIAGNOSIS — L570 Actinic keratosis: Secondary | ICD-10-CM | POA: Diagnosis not present

## 2015-08-29 DIAGNOSIS — D485 Neoplasm of uncertain behavior of skin: Secondary | ICD-10-CM | POA: Diagnosis not present

## 2015-08-29 DIAGNOSIS — L309 Dermatitis, unspecified: Secondary | ICD-10-CM | POA: Diagnosis not present

## 2015-08-29 DIAGNOSIS — L603 Nail dystrophy: Secondary | ICD-10-CM | POA: Diagnosis not present

## 2015-08-29 DIAGNOSIS — L820 Inflamed seborrheic keratosis: Secondary | ICD-10-CM | POA: Diagnosis not present

## 2015-08-29 DIAGNOSIS — D1801 Hemangioma of skin and subcutaneous tissue: Secondary | ICD-10-CM | POA: Diagnosis not present

## 2015-08-29 DIAGNOSIS — C44319 Basal cell carcinoma of skin of other parts of face: Secondary | ICD-10-CM | POA: Diagnosis not present

## 2015-10-02 DIAGNOSIS — D3132 Benign neoplasm of left choroid: Secondary | ICD-10-CM | POA: Diagnosis not present

## 2015-10-02 DIAGNOSIS — H353131 Nonexudative age-related macular degeneration, bilateral, early dry stage: Secondary | ICD-10-CM | POA: Diagnosis not present

## 2015-10-02 DIAGNOSIS — H2513 Age-related nuclear cataract, bilateral: Secondary | ICD-10-CM | POA: Diagnosis not present

## 2015-10-07 DIAGNOSIS — D3132 Benign neoplasm of left choroid: Secondary | ICD-10-CM | POA: Insufficient documentation

## 2015-10-07 DIAGNOSIS — H353131 Nonexudative age-related macular degeneration, bilateral, early dry stage: Secondary | ICD-10-CM | POA: Insufficient documentation

## 2015-12-12 DIAGNOSIS — Z23 Encounter for immunization: Secondary | ICD-10-CM | POA: Diagnosis not present

## 2016-03-13 DIAGNOSIS — J028 Acute pharyngitis due to other specified organisms: Secondary | ICD-10-CM | POA: Diagnosis not present

## 2016-03-13 DIAGNOSIS — J069 Acute upper respiratory infection, unspecified: Secondary | ICD-10-CM | POA: Diagnosis not present

## 2016-04-15 DIAGNOSIS — H524 Presbyopia: Secondary | ICD-10-CM | POA: Diagnosis not present

## 2016-04-15 DIAGNOSIS — H353131 Nonexudative age-related macular degeneration, bilateral, early dry stage: Secondary | ICD-10-CM | POA: Diagnosis not present

## 2016-04-15 DIAGNOSIS — H52203 Unspecified astigmatism, bilateral: Secondary | ICD-10-CM | POA: Diagnosis not present

## 2016-04-15 DIAGNOSIS — H5203 Hypermetropia, bilateral: Secondary | ICD-10-CM | POA: Diagnosis not present

## 2016-04-15 DIAGNOSIS — D3132 Benign neoplasm of left choroid: Secondary | ICD-10-CM | POA: Diagnosis not present

## 2016-04-15 DIAGNOSIS — H2513 Age-related nuclear cataract, bilateral: Secondary | ICD-10-CM | POA: Diagnosis not present

## 2016-08-13 DIAGNOSIS — M461 Sacroiliitis, not elsewhere classified: Secondary | ICD-10-CM | POA: Diagnosis not present

## 2016-08-26 DIAGNOSIS — E559 Vitamin D deficiency, unspecified: Secondary | ICD-10-CM | POA: Diagnosis not present

## 2016-08-26 DIAGNOSIS — E785 Hyperlipidemia, unspecified: Secondary | ICD-10-CM | POA: Diagnosis not present

## 2016-08-26 DIAGNOSIS — B370 Candidal stomatitis: Secondary | ICD-10-CM | POA: Diagnosis not present

## 2016-08-26 DIAGNOSIS — M1990 Unspecified osteoarthritis, unspecified site: Secondary | ICD-10-CM | POA: Diagnosis not present

## 2016-08-26 DIAGNOSIS — K219 Gastro-esophageal reflux disease without esophagitis: Secondary | ICD-10-CM | POA: Diagnosis not present

## 2016-08-26 DIAGNOSIS — J3489 Other specified disorders of nose and nasal sinuses: Secondary | ICD-10-CM | POA: Diagnosis not present

## 2016-08-26 DIAGNOSIS — G4700 Insomnia, unspecified: Secondary | ICD-10-CM | POA: Diagnosis not present

## 2016-08-26 DIAGNOSIS — Z86010 Personal history of colonic polyps: Secondary | ICD-10-CM | POA: Diagnosis not present

## 2016-08-26 DIAGNOSIS — Z125 Encounter for screening for malignant neoplasm of prostate: Secondary | ICD-10-CM | POA: Diagnosis not present

## 2016-08-26 DIAGNOSIS — Z1389 Encounter for screening for other disorder: Secondary | ICD-10-CM | POA: Diagnosis not present

## 2016-08-26 DIAGNOSIS — Z0001 Encounter for general adult medical examination with abnormal findings: Secondary | ICD-10-CM | POA: Diagnosis not present

## 2016-08-26 DIAGNOSIS — K5730 Diverticulosis of large intestine without perforation or abscess without bleeding: Secondary | ICD-10-CM | POA: Diagnosis not present

## 2016-08-26 DIAGNOSIS — Z79899 Other long term (current) drug therapy: Secondary | ICD-10-CM | POA: Diagnosis not present

## 2016-08-27 DIAGNOSIS — L570 Actinic keratosis: Secondary | ICD-10-CM | POA: Diagnosis not present

## 2016-08-27 DIAGNOSIS — L821 Other seborrheic keratosis: Secondary | ICD-10-CM | POA: Diagnosis not present

## 2016-08-27 DIAGNOSIS — I788 Other diseases of capillaries: Secondary | ICD-10-CM | POA: Diagnosis not present

## 2016-08-27 DIAGNOSIS — D1801 Hemangioma of skin and subcutaneous tissue: Secondary | ICD-10-CM | POA: Diagnosis not present

## 2016-08-27 DIAGNOSIS — Z85828 Personal history of other malignant neoplasm of skin: Secondary | ICD-10-CM | POA: Diagnosis not present

## 2016-11-11 DIAGNOSIS — J209 Acute bronchitis, unspecified: Secondary | ICD-10-CM | POA: Diagnosis not present

## 2016-11-19 DIAGNOSIS — Z23 Encounter for immunization: Secondary | ICD-10-CM | POA: Diagnosis not present

## 2016-11-19 DIAGNOSIS — R5383 Other fatigue: Secondary | ICD-10-CM | POA: Diagnosis not present

## 2016-11-19 DIAGNOSIS — R0609 Other forms of dyspnea: Secondary | ICD-10-CM | POA: Diagnosis not present

## 2016-11-23 ENCOUNTER — Telehealth: Payer: Self-pay | Admitting: Cardiology

## 2016-11-23 NOTE — Telephone Encounter (Signed)
New message      Received a referral from Dr [name] to schedule pt an appt for dyspnea on exertion.  When pt returned the call to the scheduler, pt was asked how long he had been having DOE.  Pt said it was not for the scheduler to ask or determine his situation a referral had been made and he wanted to see Dr Marlou Porch.  Offered an appt with the APP because appt was sooner.  Pt want to talk to the nurse.  Referral notes from Dr Inda Merlin is in the schedular room folder.    Please call pt

## 2016-11-23 NOTE — Telephone Encounter (Signed)
Called patient to see about scheduling patient this week with Cecilie Kicks NP or Truitt Merle NP who are on Dr. Marlou Porch team. Patient only wants to be seen by Dr. Marlou Porch, and his first available is in January. Informed patient of the team based care  Patient was not happy about having to see an APP, and would like to talk to his PCP Dr. Inda Merlin to see what he has to say about this situation. Encouraged patient to talk to his PCP and then I would be happy to schedule him accordingly. Patient verbalized understanding.

## 2016-11-24 ENCOUNTER — Other Ambulatory Visit (HOSPITAL_COMMUNITY): Payer: Self-pay | Admitting: Respiratory Therapy

## 2016-11-26 ENCOUNTER — Other Ambulatory Visit (HOSPITAL_COMMUNITY): Payer: Self-pay | Admitting: Respiratory Therapy

## 2016-11-26 DIAGNOSIS — R0600 Dyspnea, unspecified: Secondary | ICD-10-CM

## 2016-11-27 NOTE — Telephone Encounter (Signed)
Pt scheduled with Richardson Dopp, PA 10/12 at 10:45 am.  He did speak with Dr Inda Merlin who instructed him OK to see PA/NP and he feels comfortable with this.  Records taken to chart prep.

## 2016-12-02 ENCOUNTER — Ambulatory Visit (HOSPITAL_COMMUNITY)
Admission: RE | Admit: 2016-12-02 | Discharge: 2016-12-02 | Disposition: A | Discharge status: Home / Self Care | Payer: Medicare Other | Source: Ambulatory Visit | Attending: Internal Medicine | Admitting: Internal Medicine

## 2016-12-02 ENCOUNTER — Other Ambulatory Visit (HOSPITAL_COMMUNITY): Payer: Self-pay | Admitting: Internal Medicine

## 2016-12-02 DIAGNOSIS — R0609 Other forms of dyspnea: Secondary | ICD-10-CM | POA: Diagnosis not present

## 2016-12-02 DIAGNOSIS — R0600 Dyspnea, unspecified: Secondary | ICD-10-CM | POA: Insufficient documentation

## 2016-12-02 LAB — PULMONARY FUNCTION TEST
DL/VA % pred: 87 %
DL/VA: 4.2 ml/min/mmHg/L
DLCO unc % pred: 81 %
DLCO unc: 30.77 ml/min/mmHg
FEF 25-75 Post: 3.69 L/sec
FEF 25-75 Pre: 2.93 L/sec
FEF2575-%Change-Post: 26 %
FEF2575-%PRED-POST: 137 %
FEF2575-%Pred-Pre: 109 %
FEV1-%CHANGE-POST: 6 %
FEV1-%Pred-Post: 105 %
FEV1-%Pred-Pre: 98 %
FEV1-PRE: 3.59 L
FEV1-Post: 3.84 L
FEV1FVC-%Change-Post: 9 %
FEV1FVC-%PRED-PRE: 102 %
FEV6-%Change-Post: 0 %
FEV6-%Pred-Post: 99 %
FEV6-%Pred-Pre: 99 %
FEV6-POST: 4.68 L
FEV6-PRE: 4.7 L
FEV6FVC-%Change-Post: 1 %
FEV6FVC-%PRED-POST: 104 %
FEV6FVC-%PRED-PRE: 103 %
FVC-%CHANGE-POST: -2 %
FVC-%PRED-POST: 94 %
FVC-%PRED-PRE: 96 %
FVC-POST: 4.69 L
FVC-PRE: 4.8 L
Post FEV1/FVC ratio: 82 %
Post FEV6/FVC ratio: 100 %
Pre FEV1/FVC ratio: 75 %
Pre FEV6/FVC Ratio: 98 %
RV % PRED: 118 %
RV: 3.26 L
TLC % pred: 106 %
TLC: 8.37 L

## 2016-12-02 IMAGING — DX DG CHEST 2V
2 series · 2 of 2 positions shown · non-contrast
Comparison: [DATE]

CLINICAL DATA: Episodes of dyspnea on exertion for about 1 year. No
other chest complaints.

EXAM:
CHEST  2 VIEW

[chest pa]
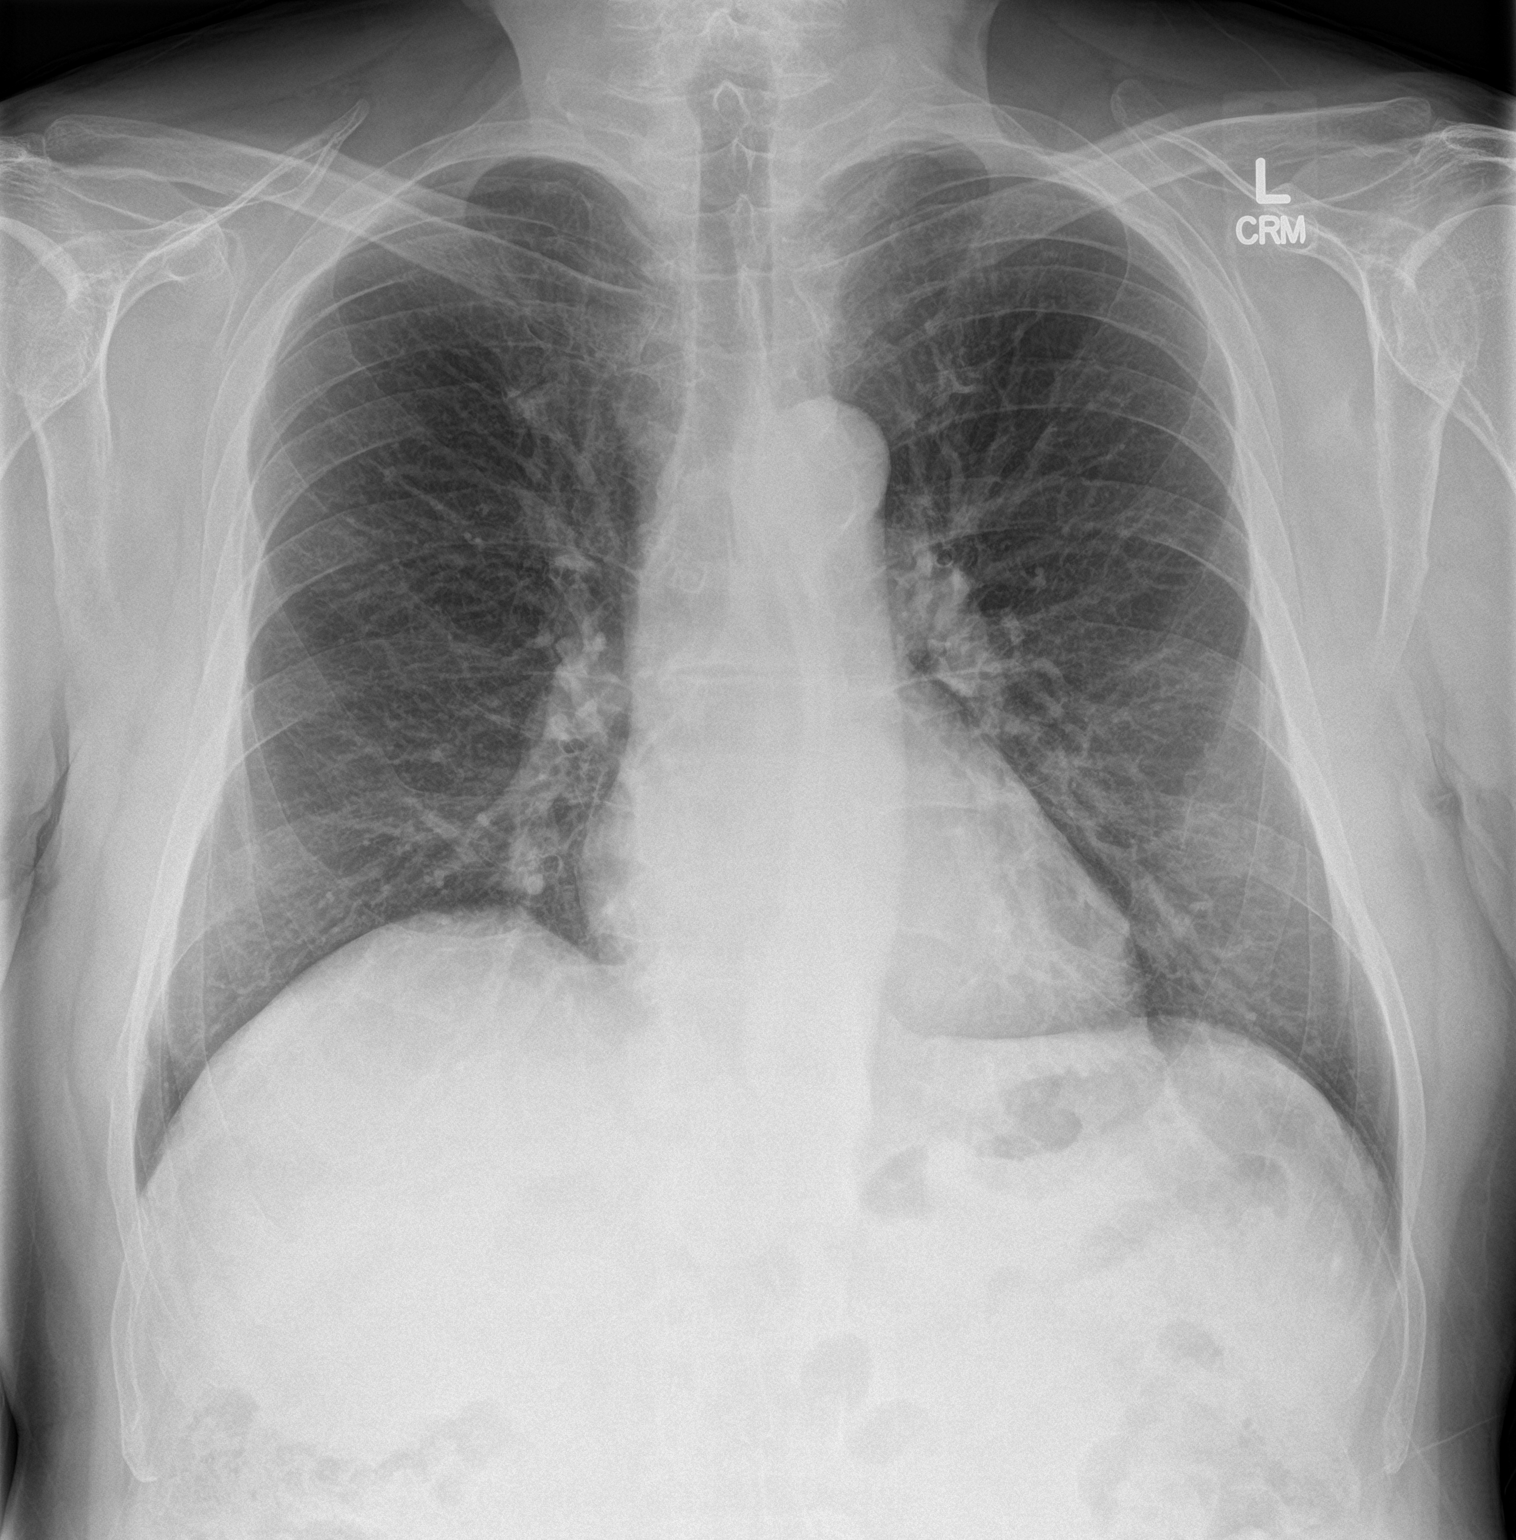

[chest lat]
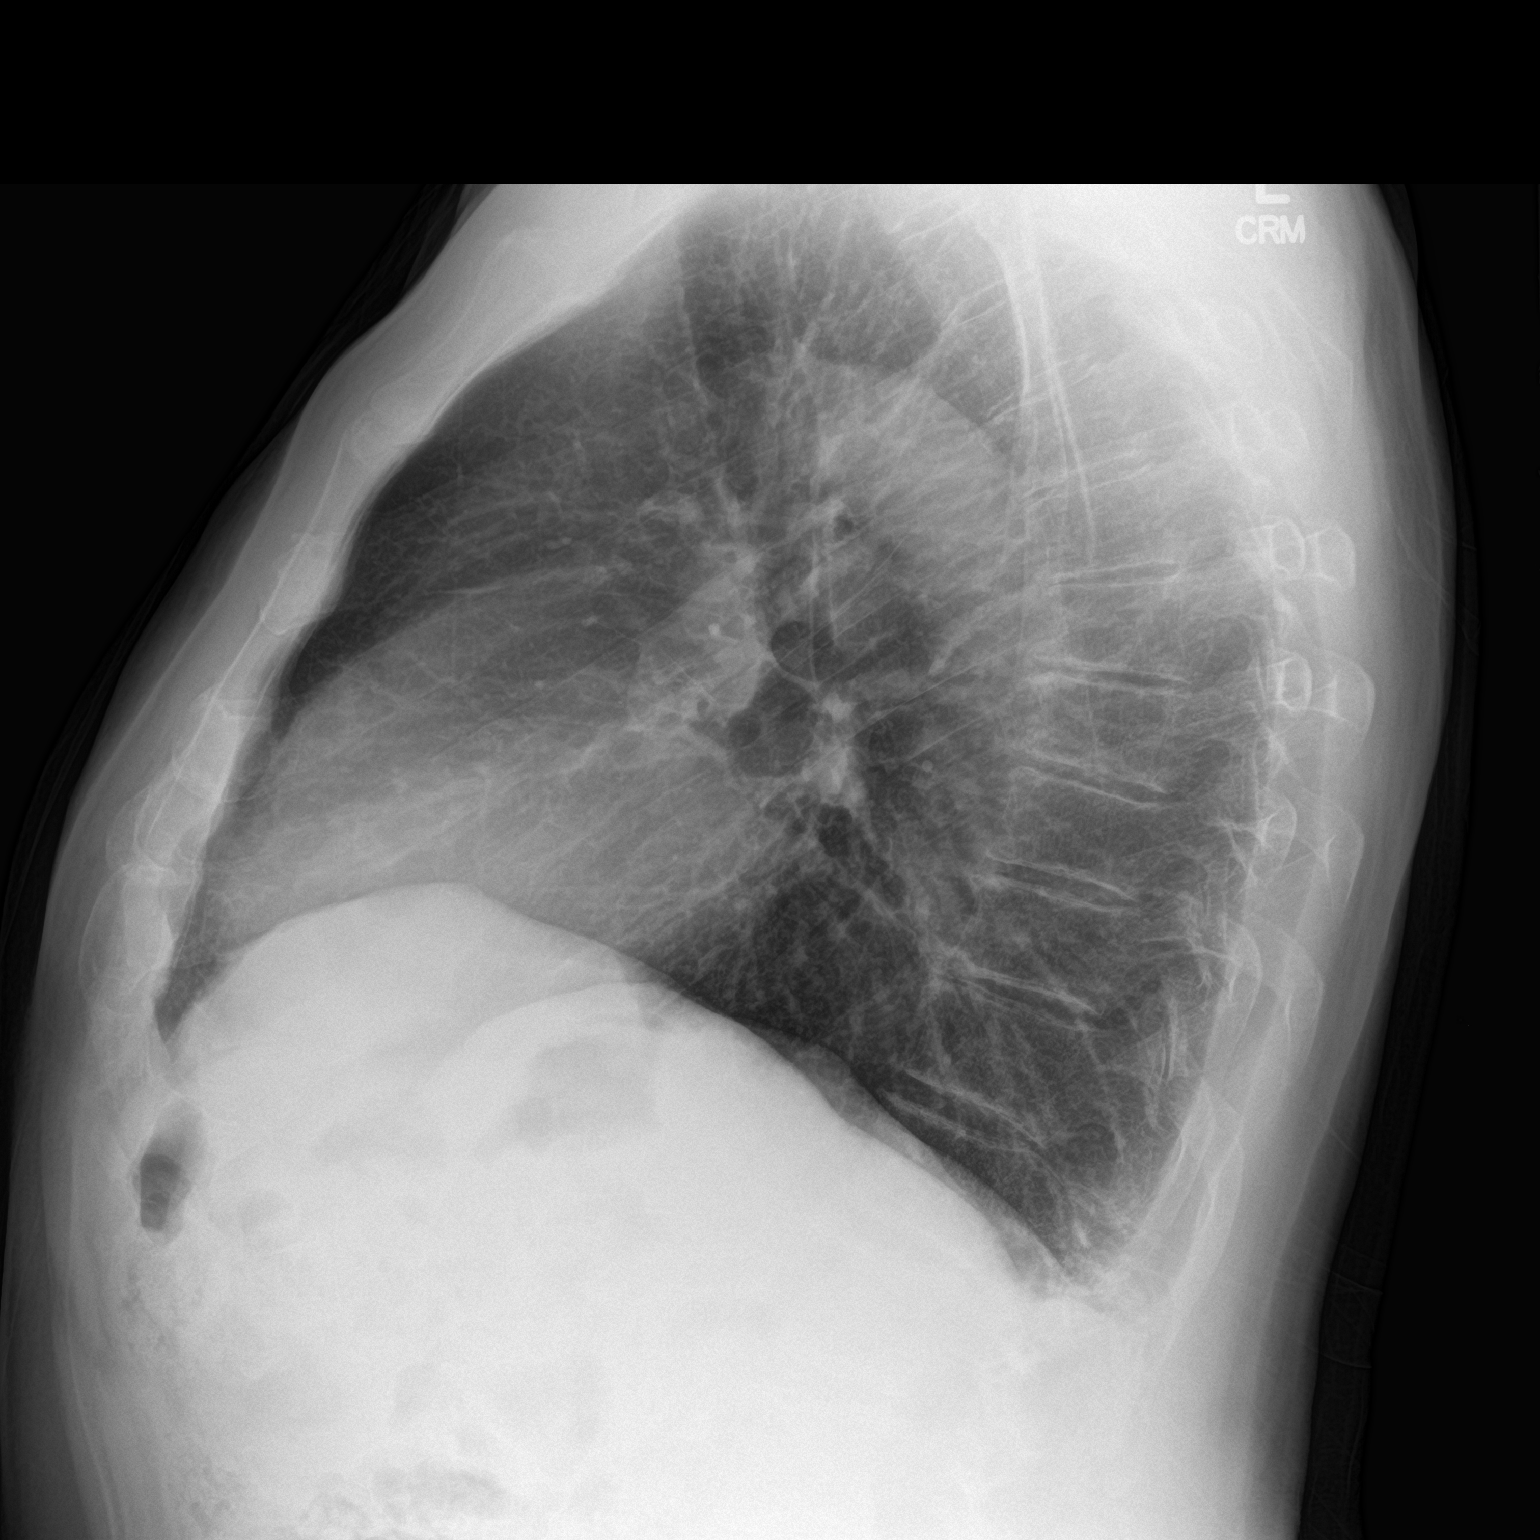

[2 of 2 positions shown; findings below may reference images not displayed]

FINDINGS: Cardiac silhouette is normal in size. No mediastinal or hilar
masses. No evidence of adenopathy.

Clear lungs.  No pleural effusion or pneumothorax.

Skeletal structures are demineralized but intact.
IMPRESSION: No active cardiopulmonary disease.

## 2016-12-02 MED ORDER — ALBUTEROL SULFATE (2.5 MG/3ML) 0.083% IN NEBU
2.5000 mg | INHALATION_SOLUTION | Freq: Once | RESPIRATORY_TRACT | Status: AC
  Administered 2016-12-02: 2.5 mg via RESPIRATORY_TRACT

## 2016-12-09 ENCOUNTER — Encounter (HOSPITAL_COMMUNITY): Payer: Medicare Other

## 2016-12-18 ENCOUNTER — Encounter: Payer: Self-pay | Admitting: Physician Assistant

## 2016-12-18 ENCOUNTER — Ambulatory Visit (INDEPENDENT_AMBULATORY_CARE_PROVIDER_SITE_OTHER): Payer: Medicare Other | Admitting: Physician Assistant

## 2016-12-18 VITALS — BP 140/74 | HR 53 | Ht 74.0 in | Wt 207.0 lb

## 2016-12-18 DIAGNOSIS — E785 Hyperlipidemia, unspecified: Secondary | ICD-10-CM

## 2016-12-18 DIAGNOSIS — I4819 Other persistent atrial fibrillation: Secondary | ICD-10-CM | POA: Insufficient documentation

## 2016-12-18 DIAGNOSIS — I481 Persistent atrial fibrillation: Secondary | ICD-10-CM

## 2016-12-18 DIAGNOSIS — K219 Gastro-esophageal reflux disease without esophagitis: Secondary | ICD-10-CM

## 2016-12-18 HISTORY — DX: Other persistent atrial fibrillation: I48.19

## 2016-12-18 MED ORDER — APIXABAN 5 MG PO TABS: 5.0000 mg | ORAL_TABLET | Freq: Two times a day (BID) | ORAL | 3 refills | Status: DC

## 2016-12-18 NOTE — Patient Instructions (Addendum)
Medication Instructions:  1. START ELIQUIS 5 MG 1 TABLET TWICE DAILY; YOU HAVE BEEN GIVEN SAMPLES AS WELL TODAY  Labwork: NONE ORDERED TODAY  Testing/Procedures: Your physician has recommended that you wear a 24 HOUR holter monitor. Holter monitors are medical devices that record the heart's electrical activity. Doctors most often use these monitors to diagnose arrhythmias. Arrhythmias are problems with the speed or rhythm of the heartbeat. The monitor is a small, portable device. You can wear one while you do your normal daily activities. This is usually used to diagnose what is causing palpitations/syncope (passing out).    Follow-Up: YOU ARE BEING REFERRED TO DR. ALLRED ON 01/11/17 @ 3:15 TO BEE SEEN IN 3 WEEKS, DX A-FIB, POSSIBLE CARDIOVERSION   Any Other Special Instructions Will Be Listed Below (If Applicable).     If you need a refill on your cardiac medications before your next appointment, please call your pharmacy.

## 2016-12-18 NOTE — Progress Notes (Signed)
Cardiology Office Note:    Date:  12/18/2016   ID:  Todd Chapman, DOB [birth year], MRN [id number]  PCP:  Todd Huddle, MD  Cardiologist:  Todd Chapman    Referring MD: Todd Huddle, MD   Chief Complaint  Patient presents with  . Shortness of Breath    History of Present Illness:    Todd Chapman is a 71 y.o. male with a hx of bradycardia.  He was evaluated by Dr. Marlou Chapman in 3/16.  Review of his electrocardiogram demonstrated PACs and junctional beats.  He was felt to be asymptomatic.  Echocardiogram was obtained and demonstrated normal LV function.  He was seen by primary care recently with complaints of shortness of breath.  Lab work was obtained: D-dimer negative, hemoglobin 14.3, CRP 0.46, sed rate 3, creatinine 1.01, potassium 4.6, ALT 24, albumin 4.5.  PFTs obtained 12/02/16 were normal.  Chest x-ray 12/02/16 was normal.  Todd Chapman presents for evaluation of shortness of breath.  Over the past 6 months, he has noted dyspnea with more extreme activities.  He also feels fatigued.  He continues to walk about 30 minutes a day.  He denies orthopnea or paroxysmal nocturnal dyspnea.  He does develop some dependent pedal edema that resolves with elevating his legs.  He denies chest discomfort.  He denies syncope.  Prior CV studies:   The following studies were reviewed today:  Echo 06/20/14 EF 65-70, no RWMA, mild MR, mod LAE, mild RAE, mod TR, PASP 35   Past Medical History:  Diagnosis Date  . Arthritis   . GERD (gastroesophageal reflux disease)   . HLD (hyperlipidemia)     Past Surgical History:  Procedure Laterality Date  . APPENDECTOMY     age 49  . COLONOSCOPY WITH PROPOFOL N/A 12/13/2012   Procedure: COLONOSCOPY WITH PROPOFOL;  Surgeon: Todd Fair, MD;  Location: [facility];  Service: Endoscopy;  Laterality: N/A;  . WRIST FRACTURE SURGERY     left-with pins    Current Medications: Current Meds  Medication Sig  . esomeprazole (NEXIUM) 40 MG capsule  Take 40 mg by mouth daily before breakfast.  . ibuprofen (ADVIL,MOTRIN) 200 MG tablet Take 400 mg by mouth every 6 (six) hours as needed for pain.  Marland Kitchen lansoprazole (PREVACID) 15 MG capsule Take 15 mg by mouth daily as needed (ACID REFLUX).   . Multiple Vitamin (MULTIVITAMIN WITH MINERALS) TABS tablet Take 1 tablet by mouth daily.  . Multiple Vitamins-Minerals (EYE VITAMINS) CAPS Take 1 capsule by mouth daily. OTC VITAMIN  . rosuvastatin (CRESTOR) 10 MG tablet Take 10 mg by mouth every morning.     Allergies:   Atorvastatin and Ezetimibe   Social History   Social History  . Marital status: Married    Spouse name: N/A  . Number of children: N/A  . Years of education: N/A   Social History Main Topics  . Smoking status: Former Research scientist (life sciences)  . Smokeless tobacco: Former Systems developer    Quit date: 11/24/1965  . Alcohol use Yes     Comment: occassionally  . Drug use: No  . Sexual activity: Not Asked   Other Topics Concern  . None   Social History Narrative  . None     Family Hx: The patient's family history includes CVA in his mother; Coronary artery disease in his father and mother; Heart attack in his father; Stroke in his mother.  ROS:   Please see the history of present illness.    ROS All  other systems reviewed and are negative.   EKGs/Labs/Other Test Reviewed:    EKG:  EKG is  ordered today.  The ekg ordered today demonstrates atrial fibrillation, HR 53, normal axis, QTC 405 ms  Recent Labs: No results found for requested labs within last 8760 hours.   Recent Lipid Panel No results found for: CHOL, TRIG, HDL, CHOLHDL, LDLCALC, LDLDIRECT  Physical Exam:    VS:  BP 140/74   Pulse (!) 53   Ht 6\' 2"  (1.88 m)   Wt 207 lb (93.9 kg)   SpO2 96%   BMI 26.58 kg/m     Wt Readings from Last 3 Encounters:  12/18/16 207 lb (93.9 kg)  05/29/14 218 lb (98.9 kg)  11/29/12 214 lb (97.1 kg)     Physical Exam  Constitutional: He is oriented to person, place, and time. He appears  well-developed and well-nourished. No distress.  HENT:  Head: Normocephalic and atraumatic.  Eyes: No scleral icterus.  Neck: No JVD present. No thyromegaly present.  Cardiovascular: An irregularly irregular rhythm present. Bradycardia present.   No murmur heard. Pulmonary/Chest: Effort normal. He has no rales.  Abdominal: Soft.  Musculoskeletal: He exhibits no edema.  Neurological: He is alert and oriented to person, place, and time.  Skin: Skin is warm and dry.  Psychiatric: He has a normal mood and affect.    ASSESSMENT:    1. Persistent atrial fibrillation (Todd Chapman)   2. Hyperlipidemia, unspecified hyperlipidemia type   3. Gastroesophageal reflux disease without esophagitis    PLAN:    In order of problems listed above:  1. Persistent atrial fibrillation (Todd Chapman) He presents today for evaluation of shortness of breath and fatigue.  His electrocardiogram demonstrates atrial fibrillation with slow ventricular response.  He has had a history of bradycardia in the past.  His thromboembolic risk factors are low. CHADS2-VASc=1 (age over 64).  Therefore, he does not require long-term anticoagulation.  However, I think he is symptomatic with atrial fibrillation and a trial of rhythm control is warranted.  Therefore, he will need short-term anticoagulation in order to pursue restoration of NSR.  I reviewed his case today with Todd Chapman.  There is concern that he would have significant bradycardia with restoration of normal sinus rhythm and may require pacemaker implantation.  He cannot take an AV nodal blocking agent b/c of bradycardia.  Therefore, he cannot take Flecainide.  Overall, it is felt that he should return for consultation with one of our atrial fibrillation experts (Dr. Rayann Chapman or Dr. Curt Chapman).  He may be a candidate for dofetilide.  I have recommended that he start anticoagulation now to help prevent delay in his treatment strategy when he is seen in the next few weeks (i.e. Cardioversion  vs Dofetilide initiation).  I have discussed risks and benefits of anticoagulation with him today.  -  Start Eliquis 5 mg twice daily  -  Obtain 24-hour Holter monitor to assess heart rate variability  -  Refer to Dr. Rayann Chapman or Dr. Curt Chapman  2. Hyperlipidemia, unspecified hyperlipidemia type Continue statin therapy per primary care  3. Gastroesophageal reflux disease without esophagitis He currently takes proton pump inhibitor therapy.  He is concerned about taking this long-term.  I have suggested trying Pepcid or generic famotidine.  Dispo:  Return for Evaluation with Dr. Rayann Chapman for Atrial Fibrillation .   Medication Adjustments/Labs and Tests Ordered: Current medicines are reviewed at length with the patient today.  Concerns regarding medicines are outlined above.  Tests Ordered: Orders Placed  This Encounter  Procedures  . Ambulatory referral to Cardiac Electrophysiology  . Holter monitor - 24 hour  . EKG 12-Lead   Medication Changes: Meds ordered this encounter  Medications  . apixaban (ELIQUIS) 5 MG TABS tablet    Sig: Take 1 tablet (5 mg total) by mouth 2 (two) times daily.    Dispense:  180 tablet    Refill:  3    Signed, Richardson Dopp, PA-C  12/18/2016 1:12 PM    [facility] [place], [place], [place]  [address] Phone: [phone]; Fax: [phone]

## 2016-12-22 DIAGNOSIS — R0609 Other forms of dyspnea: Secondary | ICD-10-CM | POA: Diagnosis not present

## 2016-12-22 DIAGNOSIS — Z7901 Long term (current) use of anticoagulants: Secondary | ICD-10-CM | POA: Diagnosis not present

## 2016-12-22 DIAGNOSIS — R5383 Other fatigue: Secondary | ICD-10-CM | POA: Diagnosis not present

## 2017-01-04 ENCOUNTER — Ambulatory Visit (INDEPENDENT_AMBULATORY_CARE_PROVIDER_SITE_OTHER): Payer: Medicare Other

## 2017-01-04 DIAGNOSIS — I481 Persistent atrial fibrillation: Secondary | ICD-10-CM

## 2017-01-04 DIAGNOSIS — I4819 Other persistent atrial fibrillation: Secondary | ICD-10-CM

## 2017-01-11 ENCOUNTER — Encounter: Payer: Self-pay | Admitting: Internal Medicine

## 2017-01-11 ENCOUNTER — Ambulatory Visit (INDEPENDENT_AMBULATORY_CARE_PROVIDER_SITE_OTHER): Payer: Medicare Other | Admitting: Internal Medicine

## 2017-01-11 VITALS — BP 114/66 | HR 78 | Ht 74.0 in | Wt 209.4 lb

## 2017-01-11 DIAGNOSIS — I481 Persistent atrial fibrillation: Secondary | ICD-10-CM

## 2017-01-11 DIAGNOSIS — R001 Bradycardia, unspecified: Secondary | ICD-10-CM

## 2017-01-11 DIAGNOSIS — I4819 Other persistent atrial fibrillation: Secondary | ICD-10-CM

## 2017-01-11 NOTE — Patient Instructions (Addendum)
Medication Instructions:  Your physician recommends that you continue on your current medications as directed. Please refer to the Current Medication list given to you today.  -- If you need a refill on your cardiac medications before your next appointment, please call your pharmacy. --  Labwork: None ordered  Testing/Procedures:  Your physician has requested that you have an echocardiogram within 2 weeks. Echocardiography is a painless test that uses sound waves to create images of your heart. It provides your doctor with information about the size and shape of your heart and how well your heart's chambers and valves are working. This procedure takes approximately one hour. There are no restrictions for this procedure.   Your physician has recommended that you have a Cardioversion (DCCV). Electrical Cardioversion uses a jolt of electricity to your heart either through paddles or wired patches attached to your chest. This is a controlled, usually prescheduled, procedure. Defibrillation is done under light anesthesia in the hospital, and you usually go home the day of the procedure. This is done to get your heart back into a normal rhythm. You are not awake for the procedure. Please see the instruction sheet given to you today.  Your physician has recommended that you have a pacemaker inserted. A pacemaker is a small device that is placed under the skin of your chest or abdomen to help control abnormal heart rhythms. This device uses electrical pulses to prompt the heart to beat at a normal rate. Pacemakers are used to treat heart rhythms that are too slow. Wire (leads) are attached to the pacemaker that goes into the chambers of you heart. This is done in the hospital and usually requires and overnight stay. Please see the instruction sheet given to you today for more information.  [employer]    Follow-Up:  Your physician recommends that you schedule a follow-up appointment as needed.  Please  call once a decision has been made about procedures above.  Thank you for choosing [facility]!!   Frederik Schmidt, RN [phone]  Any Other Special Instructions Will Be Listed Below (If Applicable).

## 2017-01-11 NOTE — Progress Notes (Signed)
Electrophysiology Office Note   Date:  01/11/2017   ID:  Todd Chapman, DOB [birth year], MRN [id number]  PCP:  [name], MD  Cardiologist:  Dr Marlou Porch Primary Electrophysiologist: Thompson Grayer, MD    Chief Complaint  Patient presents with  . Atrial Fibrillation     History of Present Illness: Todd Chapman is a 71 y.o. male who presents today for electrophysiology evaluation.   The patient is referred by Richardson Dopp for afib management.  The patient has chronic sinus bradycardia for which he was previously seen by Dr Marlou Porch.  In retrospect, the patient reports fatigue and decreased tachycardia going back to 2016, possibly due to his bradycardia.  Over the past few months, he has developed worsening SOB.  He now presents with afib with a slow ventricular response.  He also has fatigue.  chads2vasc score is 1.  Recent event monitor reveals persistent afib with intermittent bradycardia and daytime pauses of up to 2.5 seconds.  Medical options for his afib are limited.  Today, he denies symptoms of palpitations, chest pain,  orthopnea, PND, lower extremity edema, claudication, dizziness, presyncope, syncope, bleeding, or neurologic sequela. The patient is tolerating medications without difficulties and is otherwise without complaint today.    Past Medical History:  Diagnosis Date  . Arthritis   . GERD (gastroesophageal reflux disease)   . HLD (hyperlipidemia)   . Persistent atrial fibrillation ([place]) 12/18/2016   Past Surgical History:  Procedure Laterality Date  . APPENDECTOMY     age 27  . WRIST FRACTURE SURGERY     left-with pins     Current Outpatient Medications  Medication Sig Dispense Refill  . apixaban (ELIQUIS) 5 MG TABS tablet Take 1 tablet (5 mg total) by mouth 2 (two) times daily. 180 tablet 3  . ibuprofen (ADVIL,MOTRIN) 200 MG tablet Take 400 mg by mouth every 6 (six) hours as needed for pain.    Marland Kitchen lansoprazole (PREVACID) 15 MG capsule Take 15 mg by mouth  daily as needed (ACID REFLUX).     . Multiple Vitamin (MULTIVITAMIN WITH MINERALS) TABS tablet Take 1 tablet by mouth daily.    . Multiple Vitamins-Minerals (EYE VITAMINS) CAPS Take 1 capsule by mouth daily. OTC VITAMIN    . pantoprazole (PROTONIX) 40 MG tablet Take 40 mg daily by mouth.  2  . rosuvastatin (CRESTOR) 10 MG tablet Take 10 mg by mouth every morning.    . triamcinolone cream (KENALOG) 0.1 % Apply 1 application as needed topically. Use as directed  1   No current facility-administered medications for this visit.     Allergies:   Atorvastatin and Ezetimibe   Social History:  The patient  reports that he has quit smoking. He quit smokeless tobacco use about 51 years ago. He reports that he drinks alcohol. He reports that he does not use drugs.   Family History:  The patient's  family history includes CVA in his mother; Coronary artery disease in his father and mother; Heart attack in his father; Stroke in his mother.    ROS:  Please see the history of present illness.   All other systems are personally reviewed and negative.    PHYSICAL EXAM: VS:  BP 114/66   Pulse 78   Ht 6\' 2"  (1.88 m)   Wt 209 lb 6.4 oz (95 kg)   SpO2 99%   BMI 26.89 kg/m  , BMI Body mass index is 26.89 kg/m. GEN: Well nourished, well developed, in no acute distress  HEENT: normal  Neck: no JVD, carotid bruits, or masses Cardiac: iRRR; no murmurs, rubs, or gallops,no edema  Respiratory:  clear to auscultation bilaterally, normal work of breathing GI: soft, nontender, nondistended, + BS MS: no deformity or atrophy  Skin: warm and dry  Neuro:  Strength and sensation are intact Psych: euthymic mood, full affect  EKG:  EKG from 12/18/16 is reviewed and reveals afib with a slow ventricular response.  EKG from 2/26 is also reviewed and reveals sinus bradycardia     Wt Readings from Last 3 Encounters:  01/11/17 209 lb 6.4 oz (95 kg)  12/18/16 207 lb (93.9 kg)  05/29/14 218 lb (98.9 kg)       Other studies personally reviewed: Additional studies/ records that were reviewed today include: Trinidad Curet notes, Dr Marlou Porch notes, prior echo, prior ekgs  Review of the above records today demonstrates: as above   ASSESSMENT AND PLAN:  1.  Symptomatic bradycardia The patient has sinus bradycardia and also bradycardia in the setting of afib.  We are limited in our ability to provide medical therapy for his afib by bradycardia.  The patient has symptomatic bradycardia.  No reversible causes have been found.  I would therefore recommend pacemaker implantation at this time.  Risks, benefits, alternatives to pacemaker implantation were discussed in detail with the patient today. The patient understands that the risks include but are not limited to bleeding, infection, pneumothorax, perforation, tamponade, vascular damage, renal failure, MI, stroke, death,  and lead dislodgement and wishes to proceed.  He will call our office when his schedule allows.  Will plan medtronic pacemaker implant when we proceed.  2. Persistent afib with slow response Unable to provide rhythm control in the setting of his bradycardia.  Will plan cardioversion at time of his PPM implant.  Importance of compliance with eliquis without interruption were discussed with the patient today.  Risks of cardioversion were discussed. Could probably use flecainide/ diltiazem after PPM is placed.  chads2vasc score is 1.  He is currently on eliquis.  Will continue this as we plan for cardioversion.   Current medicines are reviewed at length with the patient today.   The patient does not have concerns regarding his medicines.  The following changes were made today:  none    Signed, Thompson Grayer, MD  01/11/2017 3:42 PM     [place] [address] [phone] (office) [phone] (fax)

## 2017-01-13 ENCOUNTER — Telehealth: Payer: Self-pay | Admitting: Internal Medicine

## 2017-01-13 DIAGNOSIS — I4819 Other persistent atrial fibrillation: Secondary | ICD-10-CM

## 2017-01-13 NOTE — Telephone Encounter (Signed)
LPM 11/7 md

## 2017-01-13 NOTE — Telephone Encounter (Signed)
Returned call to patient and scheduled for 02/16/17:  Please arrive at [facility] [place] recommends that you return for lab work on Do not eat or drink after midnight the night prior to the procedure Do not take any medications the morning of the test Plan for one night stay Use scrub according to directions the morning of the procedure Will need someone to drive you home at discharge

## 2017-01-13 NOTE — Telephone Encounter (Signed)
New Message  Pt call requesting to speak with RN to schedule pacemaker implant. Pt states 12/11 or 12/12 are good days for him. Pt states 12/11 is preferred. Please call back to discuss

## 2017-01-15 NOTE — Telephone Encounter (Signed)
LPM 11/9 md

## 2017-01-15 NOTE — Telephone Encounter (Signed)
Follow up ° ° ° °Patient returning call.  Please call °

## 2017-01-18 NOTE — Telephone Encounter (Signed)
Your physician has recommended that you have a pacemaker inserted. A pacemaker is a small device that is placed under the skin of your chest or abdomen to help control abnormal heart rhythms. This device uses electrical pulses to prompt the heart to beat at a normal rate. Pacemakers are used to treat heart rhythms that are too slow. Wire (leads) are attached to the pacemaker that goes into the chambers of you heart. This is done in the hospital and usually requires and overnight stay. Please see the instruction sheet given to you today for more information.  02/16/2017  Your physician has requested that you have a Cardioversion. Electrical Cardioversion uses a jolt of electricity to your heart either through paddles or wired patches attached to your chest. This is a controlled, usually prescheduled, procedure. This procedure is done at the hospital and you are not awake during the procedure. You usually go home the day of the procedure. Please see the instruction sheet given to you today for more information.  02/16/2017  Use surgical scrub as directed.  Please arrive at [facility] at 9:00 am Do not eat or drink after midnight the night prior to the procedure Do not take any medications the morning of the test Plan for one night stay Will need someone to drive you home at discharge

## 2017-01-23 DIAGNOSIS — J069 Acute upper respiratory infection, unspecified: Secondary | ICD-10-CM | POA: Diagnosis not present

## 2017-01-27 ENCOUNTER — Ambulatory Visit (HOSPITAL_COMMUNITY): Payer: Medicare Other | Attending: Internal Medicine

## 2017-01-27 ENCOUNTER — Other Ambulatory Visit: Payer: Self-pay

## 2017-01-27 DIAGNOSIS — I4819 Other persistent atrial fibrillation: Secondary | ICD-10-CM

## 2017-01-27 DIAGNOSIS — R001 Bradycardia, unspecified: Secondary | ICD-10-CM | POA: Insufficient documentation

## 2017-01-27 DIAGNOSIS — I071 Rheumatic tricuspid insufficiency: Secondary | ICD-10-CM | POA: Insufficient documentation

## 2017-01-27 DIAGNOSIS — Z87891 Personal history of nicotine dependence: Secondary | ICD-10-CM | POA: Insufficient documentation

## 2017-01-27 DIAGNOSIS — I481 Persistent atrial fibrillation: Secondary | ICD-10-CM | POA: Insufficient documentation

## 2017-01-27 DIAGNOSIS — E785 Hyperlipidemia, unspecified: Secondary | ICD-10-CM | POA: Insufficient documentation

## 2017-02-06 DIAGNOSIS — R001 Bradycardia, unspecified: Secondary | ICD-10-CM

## 2017-02-06 HISTORY — DX: Bradycardia, unspecified: R00.1

## 2017-02-08 ENCOUNTER — Other Ambulatory Visit: Payer: Medicare Other | Admitting: *Deleted

## 2017-02-08 ENCOUNTER — Telehealth: Payer: Self-pay

## 2017-02-08 DIAGNOSIS — I4819 Other persistent atrial fibrillation: Secondary | ICD-10-CM

## 2017-02-08 DIAGNOSIS — I481 Persistent atrial fibrillation: Secondary | ICD-10-CM | POA: Diagnosis not present

## 2017-02-08 NOTE — Telephone Encounter (Signed)
**Note De-Identified Todd Chapman Obfuscation** The pt was in the office today for lab work and while here he requested samples of Eliquis. I s/w the pt and advised him that I would give him 1 box of Eliquis samples (the office has been giving him samples of Eliquis since 12/18/16 when it was first prescribed to him) and that he should discuss with his pharmacist about purchasing a partal refill as he states he only needs help with his Eliquis until the 1st of the new year. He verbalized understanding and is in agreement.

## 2017-02-09 LAB — CBC WITH DIFFERENTIAL/PLATELET
Basophils Absolute: 0 10*3/uL (ref 0.0–0.2)
Basos: 0 %
EOS (ABSOLUTE): 0.1 10*3/uL (ref 0.0–0.4)
EOS: 1 %
HEMATOCRIT: 38.1 % (ref 37.5–51.0)
HEMOGLOBIN: 12.9 g/dL — AB (ref 13.0–17.7)
IMMATURE GRANULOCYTES: 0 %
Immature Grans (Abs): 0 10*3/uL (ref 0.0–0.1)
Lymphocytes Absolute: 1.5 10*3/uL (ref 0.7–3.1)
Lymphs: 26 %
MCH: 30 pg (ref 26.6–33.0)
MCHC: 33.9 g/dL (ref 31.5–35.7)
MCV: 89 fL (ref 79–97)
MONOCYTES: 11 %
MONOS ABS: 0.6 10*3/uL (ref 0.1–0.9)
NEUTROS PCT: 62 %
Neutrophils Absolute: 3.6 10*3/uL (ref 1.4–7.0)
Platelets: 168 10*3/uL (ref 150–379)
RBC: 4.3 x10E6/uL (ref 4.14–5.80)
RDW: 13 % (ref 12.3–15.4)
WBC: 5.8 10*3/uL (ref 3.4–10.8)

## 2017-02-09 LAB — BASIC METABOLIC PANEL
BUN/Creatinine Ratio: 14 (ref 10–24)
BUN: 14 mg/dL (ref 8–27)
CALCIUM: 9.9 mg/dL (ref 8.6–10.2)
CO2: 25 mmol/L (ref 20–29)
Chloride: 101 mmol/L (ref 96–106)
Creatinine, Ser: 1.01 mg/dL (ref 0.76–1.27)
GFR calc Af Amer: 85 mL/min/{1.73_m2} (ref >59)
GFR, EST NON AFRICAN AMERICAN: 73 mL/min/{1.73_m2} (ref >59)
GLUCOSE: 124 mg/dL — AB (ref 65–99)
POTASSIUM: 4.3 mmol/L (ref 3.5–5.2)
Sodium: 141 mmol/L (ref 134–144)

## 2017-02-16 ENCOUNTER — Ambulatory Visit (HOSPITAL_COMMUNITY)
Admission: RE | Admit: 2017-02-16 | Discharge: 2017-02-17 | Disposition: A | Discharge status: Home / Self Care | Payer: Medicare Other | Source: Ambulatory Visit | Attending: Internal Medicine | Admitting: Internal Medicine

## 2017-02-16 ENCOUNTER — Encounter (HOSPITAL_COMMUNITY): Payer: Self-pay | Admitting: General Practice

## 2017-02-16 ENCOUNTER — Other Ambulatory Visit: Payer: Self-pay

## 2017-02-16 ENCOUNTER — Encounter (HOSPITAL_COMMUNITY)
Admission: RE | Disposition: A | Discharge status: Home / Self Care | Payer: Self-pay | Source: Ambulatory Visit | Attending: Internal Medicine

## 2017-02-16 DIAGNOSIS — I484 Atypical atrial flutter: Secondary | ICD-10-CM | POA: Insufficient documentation

## 2017-02-16 DIAGNOSIS — Z7901 Long term (current) use of anticoagulants: Secondary | ICD-10-CM | POA: Insufficient documentation

## 2017-02-16 DIAGNOSIS — I700 Atherosclerosis of aorta: Secondary | ICD-10-CM | POA: Insufficient documentation

## 2017-02-16 DIAGNOSIS — M1990 Unspecified osteoarthritis, unspecified site: Secondary | ICD-10-CM | POA: Diagnosis not present

## 2017-02-16 DIAGNOSIS — Z87891 Personal history of nicotine dependence: Secondary | ICD-10-CM | POA: Diagnosis not present

## 2017-02-16 DIAGNOSIS — K219 Gastro-esophageal reflux disease without esophagitis: Secondary | ICD-10-CM | POA: Diagnosis not present

## 2017-02-16 DIAGNOSIS — I495 Sick sinus syndrome: Secondary | ICD-10-CM | POA: Insufficient documentation

## 2017-02-16 DIAGNOSIS — I481 Persistent atrial fibrillation: Secondary | ICD-10-CM | POA: Insufficient documentation

## 2017-02-16 DIAGNOSIS — Z8249 Family history of ischemic heart disease and other diseases of the circulatory system: Secondary | ICD-10-CM | POA: Insufficient documentation

## 2017-02-16 DIAGNOSIS — I4892 Unspecified atrial flutter: Secondary | ICD-10-CM | POA: Diagnosis not present

## 2017-02-16 DIAGNOSIS — Z959 Presence of cardiac and vascular implant and graft, unspecified: Secondary | ICD-10-CM

## 2017-02-16 DIAGNOSIS — E785 Hyperlipidemia, unspecified: Secondary | ICD-10-CM | POA: Diagnosis not present

## 2017-02-16 HISTORY — PX: INSERT / REPLACE / REMOVE PACEMAKER: SUR710

## 2017-02-16 HISTORY — DX: Presence of cardiac pacemaker: Z95.0

## 2017-02-16 HISTORY — DX: Pneumonia, unspecified organism: J18.9

## 2017-02-16 HISTORY — PX: CARDIOVERSION: SHX1299

## 2017-02-16 HISTORY — PX: CARDIOVERSION: EP1203

## 2017-02-16 HISTORY — PX: PACEMAKER IMPLANT: EP1218

## 2017-02-16 LAB — SURGICAL PCR SCREEN
MRSA, PCR: NEGATIVE
Staphylococcus aureus: NEGATIVE

## 2017-02-16 SURGERY — CARDIOVERSION (CATH LAB)
Anesthesia: LOCAL

## 2017-02-16 MED ORDER — ONDANSETRON HCL 4 MG/2ML IJ SOLN: 4.0000 mg | Freq: Four times a day (QID) | INTRAMUSCULAR | Status: DC | PRN

## 2017-02-16 MED ORDER — SODIUM CHLORIDE 0.9 % IV SOLN: 250.0000 mL | INTRAVENOUS | Status: DC | PRN

## 2017-02-16 MED ORDER — OFF THE BEAT BOOK
Freq: Once | Status: AC
  Administered 2017-02-17: 02:00:00
  Filled 2017-02-16: qty 1

## 2017-02-16 MED ORDER — SODIUM CHLORIDE 0.9 % IR SOLN
Status: AC
  Filled 2017-02-16: qty 2

## 2017-02-16 MED ORDER — IOPAMIDOL (ISOVUE-370) INJECTION 76%
INTRAVENOUS | Status: AC
  Filled 2017-02-16: qty 50

## 2017-02-16 MED ORDER — ACETAMINOPHEN 325 MG PO TABS
325.0000 mg | ORAL_TABLET | ORAL | Status: DC | PRN
  Administered 2017-02-16: 18:00:00 650 mg via ORAL
  Filled 2017-02-16: qty 2

## 2017-02-16 MED ORDER — SODIUM CHLORIDE 0.9 % IR SOLN
80.0000 mg | Status: AC
  Administered 2017-02-16: 80 mg

## 2017-02-16 MED ORDER — CEFAZOLIN SODIUM-DEXTROSE 2-4 GM/100ML-% IV SOLN
2.0000 g | INTRAVENOUS | Status: AC
  Administered 2017-02-16: 2 g via INTRAVENOUS

## 2017-02-16 MED ORDER — APIXABAN 5 MG PO TABS
5.0000 mg | ORAL_TABLET | Freq: Two times a day (BID) | ORAL | Status: DC
  Administered 2017-02-16 – 2017-02-17 (×2): 5 mg via ORAL
  Filled 2017-02-16 (×2): qty 1

## 2017-02-16 MED ORDER — CEFAZOLIN SODIUM-DEXTROSE 2-4 GM/100ML-% IV SOLN
INTRAVENOUS | Status: AC
  Filled 2017-02-16: qty 100

## 2017-02-16 MED ORDER — HYDROCODONE-ACETAMINOPHEN 5-325 MG PO TABS
1.0000 | ORAL_TABLET | ORAL | Status: DC | PRN
  Administered 2017-02-16: 1 via ORAL
  Filled 2017-02-16 (×2): qty 1

## 2017-02-16 MED ORDER — LIDOCAINE HCL (PF) 1 % IJ SOLN
INTRAMUSCULAR | Status: DC | PRN
  Administered 2017-02-16: 45 mL

## 2017-02-16 MED ORDER — LIDOCAINE HCL (PF) 1 % IJ SOLN
INTRAMUSCULAR | Status: AC
  Filled 2017-02-16: qty 30

## 2017-02-16 MED ORDER — IOPAMIDOL (ISOVUE-370) INJECTION 76%
INTRAVENOUS | Status: DC | PRN
  Administered 2017-02-16: 15 mL via INTRAVENOUS

## 2017-02-16 MED ORDER — PANTOPRAZOLE SODIUM 40 MG PO TBEC
40.0000 mg | DELAYED_RELEASE_TABLET | Freq: Every day | ORAL | Status: DC
  Administered 2017-02-16 – 2017-02-17 (×2): 40 mg via ORAL
  Filled 2017-02-16 (×2): qty 1

## 2017-02-16 MED ORDER — ROSUVASTATIN CALCIUM 10 MG PO TABS
10.0000 mg | ORAL_TABLET | Freq: Every day | ORAL | Status: DC
  Administered 2017-02-16: 10 mg via ORAL
  Filled 2017-02-16: qty 1

## 2017-02-16 MED ORDER — CHLORHEXIDINE GLUCONATE 4 % EX LIQD: 60.0000 mL | CUTANEOUS | Status: DC

## 2017-02-16 MED ORDER — SODIUM CHLORIDE 0.9 % IV SOLN
INTRAVENOUS | Status: DC
  Administered 2017-02-16: 10:00:00 via INTRAVENOUS

## 2017-02-16 MED ORDER — HEPARIN (PORCINE) IN NACL 2-0.9 UNIT/ML-% IJ SOLN
INTRAMUSCULAR | Status: AC
  Filled 2017-02-16: qty 500

## 2017-02-16 MED ORDER — SODIUM CHLORIDE 0.9% FLUSH
3.0000 mL | Freq: Two times a day (BID) | INTRAVENOUS | Status: DC
  Administered 2017-02-16: 3 mL via INTRAVENOUS

## 2017-02-16 MED ORDER — MIDAZOLAM HCL 5 MG/5ML IJ SOLN
INTRAMUSCULAR | Status: DC | PRN
  Administered 2017-02-16: 1 mg via INTRAVENOUS
  Administered 2017-02-16: 2 mg via INTRAVENOUS
  Administered 2017-02-16: 1 mg via INTRAVENOUS
  Administered 2017-02-16: 2 mg via INTRAVENOUS

## 2017-02-16 MED ORDER — FENTANYL CITRATE (PF) 100 MCG/2ML IJ SOLN
INTRAMUSCULAR | Status: DC | PRN
  Administered 2017-02-16 (×2): 25 ug via INTRAVENOUS

## 2017-02-16 MED ORDER — YOU HAVE A PACEMAKER BOOK
Freq: Once | Status: AC
  Administered 2017-02-17: 1
  Filled 2017-02-16: qty 1

## 2017-02-16 MED ORDER — FENTANYL CITRATE (PF) 100 MCG/2ML IJ SOLN
INTRAMUSCULAR | Status: AC
  Filled 2017-02-16: qty 2

## 2017-02-16 MED ORDER — CEFAZOLIN SODIUM-DEXTROSE 1-4 GM/50ML-% IV SOLN
1.0000 g | Freq: Four times a day (QID) | INTRAVENOUS | Status: AC
  Administered 2017-02-16 – 2017-02-17 (×3): 1 g via INTRAVENOUS
  Filled 2017-02-16 (×3): qty 50

## 2017-02-16 MED ORDER — OXYCODONE-ACETAMINOPHEN 5-325 MG PO TABS
1.0000 | ORAL_TABLET | ORAL | Status: DC | PRN
  Administered 2017-02-17: 1 via ORAL
  Filled 2017-02-16: qty 1

## 2017-02-16 MED ORDER — MIDAZOLAM HCL 5 MG/5ML IJ SOLN
INTRAMUSCULAR | Status: AC
  Filled 2017-02-16: qty 5

## 2017-02-16 MED ORDER — SODIUM CHLORIDE 0.9% FLUSH: 3.0000 mL | INTRAVENOUS | Status: DC | PRN

## 2017-02-16 MED ORDER — HEPARIN (PORCINE) IN NACL 2-0.9 UNIT/ML-% IJ SOLN
INTRAMUSCULAR | Status: AC | PRN
  Administered 2017-02-16: 500 mL

## 2017-02-16 MED ORDER — MUPIROCIN 2 % EX OINT
1.0000 "application " | TOPICAL_OINTMENT | Freq: Once | CUTANEOUS | Status: AC
  Administered 2017-02-16: 1 via TOPICAL
  Filled 2017-02-16: qty 22

## 2017-02-16 MED ORDER — MUPIROCIN 2 % EX OINT
TOPICAL_OINTMENT | CUTANEOUS | Status: AC
  Administered 2017-02-16: 1 via TOPICAL
  Filled 2017-02-16: qty 22

## 2017-02-16 SURGICAL SUPPLY — 7 items
CABLE SURGICAL S-101-97-12 (CABLE) ×1 IMPLANT
LEAD TENDRIL MRI 52CM LPA1200M (Lead) ×1 IMPLANT
LEAD TENDRIL MRI 58CM LPA1200M (Lead) ×1 IMPLANT
PACEMAKER ASSURITY DR-RF (Pacemaker) ×1 IMPLANT
PAD DEFIB LIFELINK (PAD) ×1 IMPLANT
SHEATH CLASSIC 8F (SHEATH) ×2 IMPLANT
TRAY PACEMAKER INSERTION (PACKS) ×1 IMPLANT

## 2017-02-16 NOTE — Discharge Summary (Signed)
ELECTROPHYSIOLOGY PROCEDURE DISCHARGE SUMMARY    Patient ID: Todd Chapman,  MRN: [id number], DOB/AGE: 1945/11/15 71 y.o.  Admit date: 02/16/2017 Discharge date: 02/17/2017  Primary Care Physician: [name], MD  Primary Cardiologist: Dr. Marlou Porch Electrophysiologist: Dr. Rayann Heman  Primary Discharge Diagnosis:  1. Symptomatic bradycardia 2. Persistent AFib     CHA2DS2Vasc is one, on Eliquis  Secondary Discharge Diagnosis:  none  Allergies  Allergen Reactions  . Atorvastatin Palpitations  . Ezetimibe Palpitations     Procedures This Admission:  1.  Implantation of a STJ dual chamber PPM on 02/16/17 by Dr Rayann Heman.  See op note for full details. There were no immediate post procedure complications. 2.  CXR on 02/17/17 demonstrated no pneumothorax status post device implantation.  3.  02/16/17: DCCV, Dr. Rayann Heman  Brief HPI: Todd Chapman is a 71 y.o. male was referred to electrophysiology in the outpatient setting for consideration of PPM implantation and AFib management options.  Past medical history is noted above.  The patient has had symptomatic bradycardia without reversible causes identified.  Risks, benefits, and alternatives to PPM implantation and DCCV were reviewed with the patient who wished to proceed.   Hospital Course:  The patient was admitted and underwent implantation of a PPM and DCCV with details as outlined above.  He was monitored on telemetry overnight which demonstrated atrial pacing with intrinsic ventricular conduction.  Left chest was without hematoma or ecchymosis.  The device was interrogated and found to be functioning normally.  CXR was obtained and demonstrated no pneumothorax status post device implantation.  Wound care, arm mobility, and restrictions were reviewed with the patient. The patient was examined by Dr. Rayann Heman and considered stable for discharge to home.    Physical Exam: Vitals:   02/16/17 1551 02/16/17 1630 02/16/17 2024  02/17/17 0502  BP: (!) 185/95 (!) 166/95 125/82 (!) 142/90  Pulse: 60 (!) 58 60 60  Resp: 16 18 16 18   Temp: 97.7 F (36.5 C)  98.1 F (36.7 C) 98 F (36.7 C)  TempSrc: Oral  Oral Oral  SpO2: 100% 99% 98% 98%  Weight:    207 lb 3.7 oz (94 kg)  Height:        GEN- The patient is well appearing, alert and oriented x 3 today.   HEENT: normocephalic, atraumatic; sclera clear, conjunctiva pink; hearing intact; oropharynx clear; neck supple Lungs- CTA b/l, normal work of breathing.  No wheezes, rales, rhonchi Heart- RRR, no murmurs, rubs or gallops, PMI not laterally displaced GI- soft, non-tender, non-distended Extremities- no clubbing, cyanosis, or edema MS- no significant deformity, age appropriate atrophy Skin- warm and dry, no rash or lesion, left chest without hematoma/ecchymosis Psych- euthymic mood, full affect Neuro- no gross deficits   Labs:   Lab Results  Component Value Date   WBC 5.8 02/08/2017   HGB 12.9 (L) 02/08/2017   HCT 38.1 02/08/2017   MCV 89 02/08/2017   PLT 168 02/08/2017    Recent Labs  Lab 02/17/17 0517  NA 138  K 4.8  CL 105  CO2 28  BUN 12  CREATININE 0.87  CALCIUM 10.1  GLUCOSE 110*    Discharge Medications:  Allergies as of 02/17/2017      Reactions   Atorvastatin Palpitations   Ezetimibe Palpitations      Medication List    TAKE these medications   acetaminophen 500 MG tablet Commonly known as:  TYLENOL Take 500 mg by mouth daily as needed for mild pain.  apixaban 5 MG Tabs tablet Commonly known as:  ELIQUIS Take 1 tablet (5 mg total) by mouth 2 (two) times daily.   EYE VITAMINS Caps Take 1 capsule by mouth daily.   ibuprofen 200 MG tablet Commonly known as:  ADVIL,MOTRIN Take 200-400 mg by mouth daily as needed for moderate pain.   loratadine 10 MG tablet Commonly known as:  CLARITIN Take 10 mg by mouth daily as needed for allergies.   multivitamin with minerals Tabs tablet Take 1 tablet by mouth daily.     mupirocin ointment 2 % Commonly known as:  BACTROBAN Place 1 application into the nose daily as needed (dry nose).   pantoprazole 40 MG tablet Commonly known as:  PROTONIX Take 40 mg daily by mouth.   rosuvastatin 10 MG tablet Commonly known as:  CRESTOR Take 10 mg by mouth every morning.   triamcinolone cream 0.1 % Commonly known as:  KENALOG Apply 1 application topically as needed (redness).       Disposition:  Home  Discharge Instructions    Diet - low sodium heart healthy   Complete by:  As directed    Increase activity slowly   Complete by:  As directed      Follow-up Information    [facility] Follow up on 02/25/2017.   Specialty:  Cardiology Why:  12:00PM (noon), wound check visit Contact information: [address] [place]       Thompson Grayer, MD Follow up.   Specialty:  Cardiology Why:  You will be called by Dr. Jackalyn Lombard scheduler to schedule a 3 month follow up visit with him Contact information: [place] [address] [phone]           Duration of Discharge Encounter: Greater than 30 minutes including physician time.  Signed, Chanetta Marshall, NP 02/17/2017 8:54 AM   I have seen, examined the patient, and reviewed the above assessment and plan.  Changes to above are made where necessary.  No hematoma on exam.  CXR reveals stable leads, no ptx.  Device interrogation is reviewed and normal.  Routine discharge and follow-up.  Co Sign: Thompson Grayer, MD 02/17/2017 9:15 AM

## 2017-02-16 NOTE — H&P (Signed)
Chief Complaint  Patient presents with  . Atrial Fibrillation     History of Present Illness: Todd Chapman is a 71 y.o. male who presents today for cardioversion and pacemaker implantation for persistent afib and symptomatic bradycardia.  The patient has chronic sinus bradycardia for which he was previously seen by Dr Marlou Porch.  In retrospect, the patient reports fatigue and decreased tachycardia going back to 2016, possibly due to his bradycardia.  Over the past few months, he has developed worsening SOB.  He now presents with afib with a slow ventricular response.  He also has fatigue.  chads2vasc score is 1.  Recent event monitor reveals persistent afib with intermittent bradycardia and daytime pauses of up to 2.5 seconds.  Medical options for his afib are limited.  Today, he denies symptoms of palpitations, chest pain,  orthopnea, PND, lower extremity edema, claudication, dizziness, presyncope, syncope, bleeding, or neurologic sequela. The patient is tolerating medications without difficulties and is otherwise without complaint today.        Past Medical History:  Diagnosis Date  . Arthritis   . GERD (gastroesophageal reflux disease)   . HLD (hyperlipidemia)   . Persistent atrial fibrillation ([place]) 12/18/2016        Past Surgical History:  Procedure Laterality Date  . APPENDECTOMY     age 64  . WRIST FRACTURE SURGERY     left-with pins           Current Outpatient Medications  Medication Sig Dispense Refill  . apixaban (ELIQUIS) 5 MG TABS tablet Take 1 tablet (5 mg total) by mouth 2 (two) times daily. 180 tablet 3  . ibuprofen (ADVIL,MOTRIN) 200 MG tablet Take 400 mg by mouth every 6 (six) hours as needed for pain.    Marland Kitchen lansoprazole (PREVACID) 15 MG capsule Take 15 mg by mouth daily as needed (ACID REFLUX).     . Multiple Vitamin (MULTIVITAMIN WITH MINERALS) TABS tablet Take 1 tablet by mouth daily.    . Multiple Vitamins-Minerals (EYE VITAMINS) CAPS  Take 1 capsule by mouth daily. OTC VITAMIN    . pantoprazole (PROTONIX) 40 MG tablet Take 40 mg daily by mouth.  2  . rosuvastatin (CRESTOR) 10 MG tablet Take 10 mg by mouth every morning.    . triamcinolone cream (KENALOG) 0.1 % Apply 1 application as needed topically. Use as directed  1   No current facility-administered medications for this visit.     Allergies:   Atorvastatin and Ezetimibe   Social History:  The patient  reports that he has quit smoking. He quit smokeless tobacco use about 51 years ago. He reports that he drinks alcohol. He reports that he does not use drugs.   Family History:  The patient's  family history includes CVA in his mother; Coronary artery disease in his father and mother; Heart attack in his father; Stroke in his mother.    ROS:  Please see the history of present illness.   All other systems are personally reviewed and negative.    PHYSICAL EXAM: Vitals:   02/16/17 0853  BP: (!) 131/94  Pulse: 63  Temp: (!) 97.5 F (36.4 C)  SpO2: 98%  GEN: Well nourished, well developed, in no acute distress  HEENT: normal  Neck: no JVD, carotid bruits, or masses Cardiac: iRRR; no murmurs, rubs, or gallops,no edema  Respiratory:  clear to auscultation bilaterally, normal work of breathing GI: soft, nontender, nondistended, + BS MS: no deformity or atrophy  Skin: warm and dry  Neuro:  Strength and sensation are intact Psych: euthymic mood, full affect   ekgs in epic are all reviewed      Wt Readings from Last 3 Encounters:  01/11/17 209 lb 6.4 oz (95 kg)  12/18/16 207 lb (93.9 kg)  05/29/14 218 lb (98.9 kg)      Other studies personally reviewed: Additional studies/ records that were reviewed today include: echo and my priro note are reviewed today   ASSESSMENT AND PLAN:  1.  Symptomatic bradycardia The patient has sinus bradycardia and also bradycardia in the setting of afib.  We are limited in our ability to provide  medical therapy for his afib by symptomatic bradycardia.  No reversible causes have been found. The patient has symptomatic bradycardia.  I would therefore recommend pacemaker implantation at this time.  Risks, benefits, alternatives to pacemaker implantation were discussed in detail with the patient and his wife today. The patient understands that the risks include but are not limited to bleeding, infection, pneumothorax, perforation, tamponade, vascular damage, renal failure, MI, stroke, death,  and lead dislodgement and wishes to proceed at this time.  We discussed remote monitoring as well as time away from work post implant at length today.  2. Persistent afib with slow response Unable to provide rhythm control in the setting of his bradycardia.  Will plan cardioversion if still in afib at time of his PPM implant.  Importance of compliance with eliquis without interruption were discussed with the patient today.  Risks of cardioversion were discussed. He reports compliance with eliquis without interruption.    Thompson Grayer MD, [facility] 02/16/2017 11:31 AM

## 2017-02-17 ENCOUNTER — Encounter (HOSPITAL_COMMUNITY): Payer: Self-pay | Admitting: Internal Medicine

## 2017-02-17 ENCOUNTER — Ambulatory Visit (HOSPITAL_COMMUNITY): Payer: Medicare Other

## 2017-02-17 DIAGNOSIS — E785 Hyperlipidemia, unspecified: Secondary | ICD-10-CM | POA: Diagnosis not present

## 2017-02-17 DIAGNOSIS — M1990 Unspecified osteoarthritis, unspecified site: Secondary | ICD-10-CM | POA: Diagnosis not present

## 2017-02-17 DIAGNOSIS — I481 Persistent atrial fibrillation: Secondary | ICD-10-CM | POA: Diagnosis not present

## 2017-02-17 DIAGNOSIS — I700 Atherosclerosis of aorta: Secondary | ICD-10-CM | POA: Diagnosis not present

## 2017-02-17 DIAGNOSIS — Z7901 Long term (current) use of anticoagulants: Secondary | ICD-10-CM | POA: Diagnosis not present

## 2017-02-17 DIAGNOSIS — I495 Sick sinus syndrome: Secondary | ICD-10-CM

## 2017-02-17 DIAGNOSIS — Z8249 Family history of ischemic heart disease and other diseases of the circulatory system: Secondary | ICD-10-CM | POA: Diagnosis not present

## 2017-02-17 DIAGNOSIS — Z87891 Personal history of nicotine dependence: Secondary | ICD-10-CM | POA: Diagnosis not present

## 2017-02-17 DIAGNOSIS — I484 Atypical atrial flutter: Secondary | ICD-10-CM | POA: Diagnosis not present

## 2017-02-17 DIAGNOSIS — K219 Gastro-esophageal reflux disease without esophagitis: Secondary | ICD-10-CM | POA: Diagnosis not present

## 2017-02-17 LAB — BASIC METABOLIC PANEL
ANION GAP: 5 (ref 5–15)
BUN: 12 mg/dL (ref 6–20)
CALCIUM: 10.1 mg/dL (ref 8.9–10.3)
CO2: 28 mmol/L (ref 22–32)
Chloride: 105 mmol/L (ref 101–111)
Creatinine, Ser: 0.87 mg/dL (ref 0.61–1.24)
GFR calc Af Amer: >60 mL/min (ref >60)
GLUCOSE: 110 mg/dL — AB (ref 65–99)
POTASSIUM: 4.8 mmol/L (ref 3.5–5.1)
Sodium: 138 mmol/L (ref 135–145)

## 2017-02-17 IMAGING — CR DG CHEST 2V
2 series · 2 of 2 positions shown · non-contrast
Comparison: Chest x-ray of [DATE]

CLINICAL DATA: Status post permanent pacemaker placement. History
of atrial fibrillation, former smoker.

EXAM:
CHEST  2 VIEW

[chest pa]
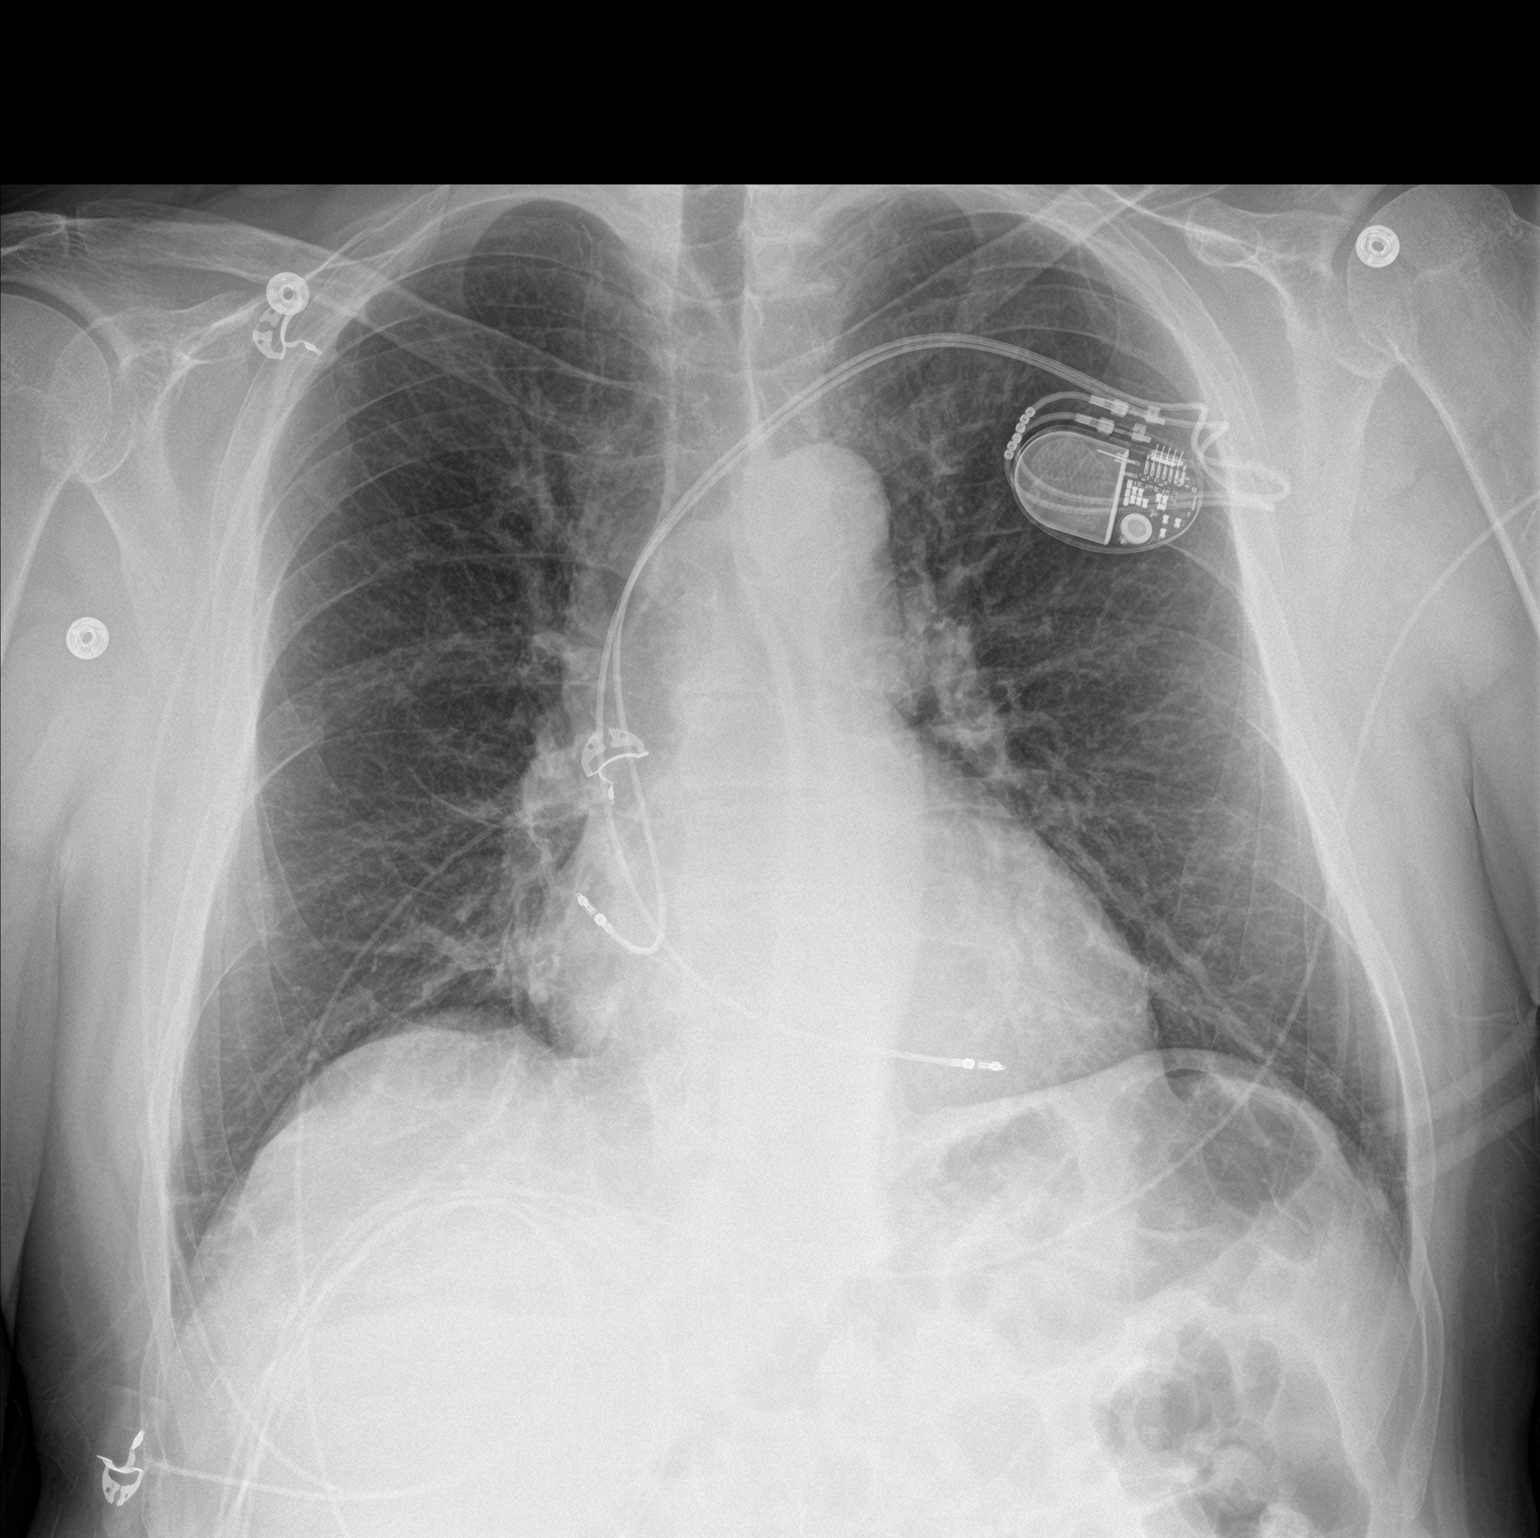

[chest lat]
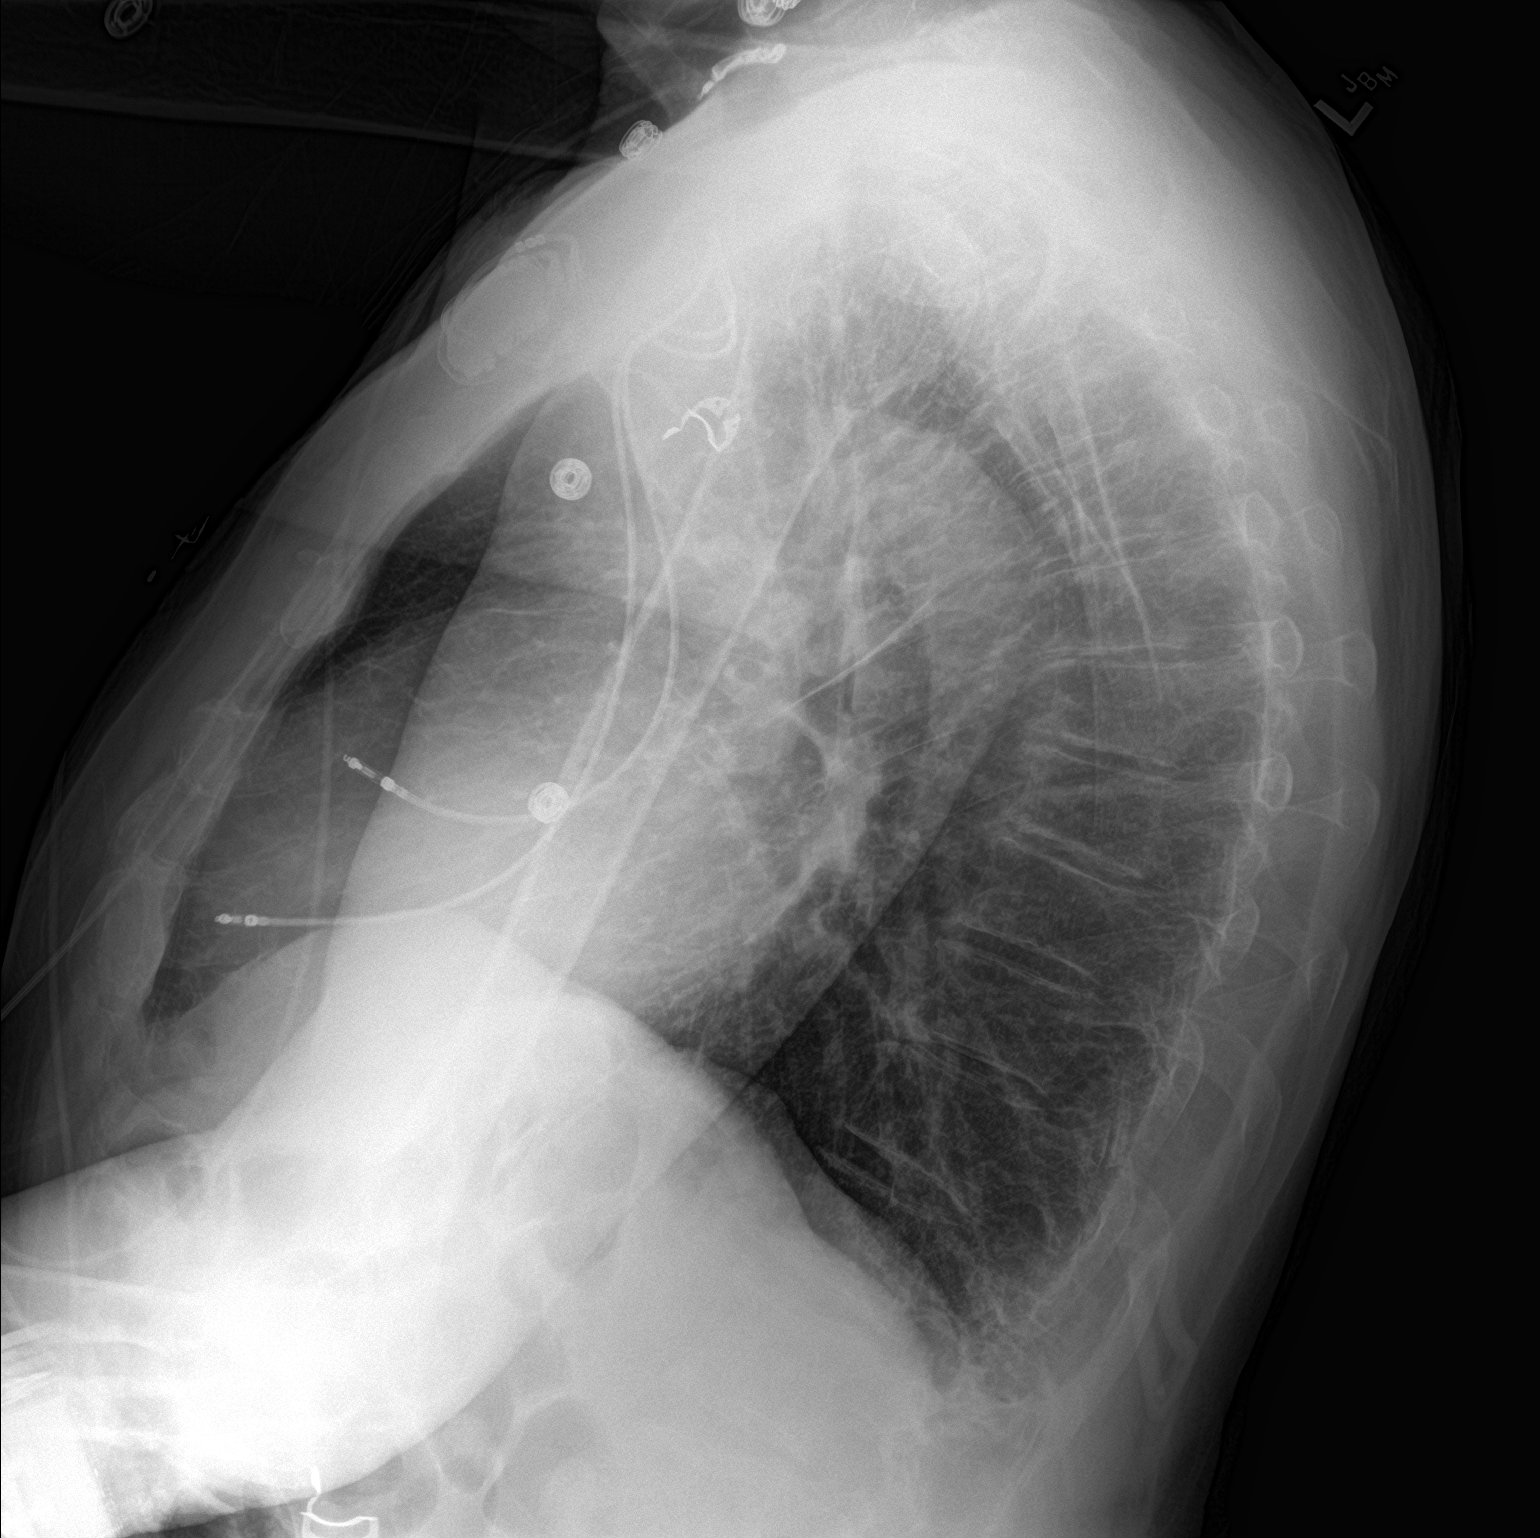

[2 of 2 positions shown; findings below may reference images not displayed]

FINDINGS: The lungs are well-expanded. There is no pneumothorax or pleural
effusion. The interstitial markings are coarse though stable. The
heart and pulmonary vascularity are normal. The mediastinum is
normal in width. There is calcification in the wall of the aortic
arch. The ICD is in reasonable position with the generator overlying
the left pectoral region. The bony thorax is unremarkable.
IMPRESSION: There is no postprocedure complication following permanent pacemaker
placement.

Thoracic aortic atherosclerosis.

## 2017-02-17 NOTE — Progress Notes (Signed)
Pt. being discharged home with his wife. Discharge instructions given and education completed with all questions answered. All belongings gathered and none left at the bedside.

## 2017-02-17 NOTE — Discharge Instructions (Signed)
° ° °  Supplemental Discharge Instructions for  Pacemaker/Defibrillator Patients  Activity No heavy lifting or vigorous activity with your left/right arm for 6 to 8 weeks.  Do not raise your left/right arm above your head for one week.  Gradually raise your affected arm as drawn below.             02/20/17                    02/21/17                 02/22/17                02/23/17 __  NO DRIVING for 1 week   ; you may begin driving on   63/78/58  .  WOUND CARE - Keep the wound area clean and dry.  Do not get this area wet, no showers for one week; you may shower once cleared to at your wound check visit  . - The tape/steri-strips on your wound will fall off; do not pull them off.  No bandage is needed on the site.  DO  NOT apply any creams, oils, or ointments to the wound area. - If you notice any drainage or discharge from the wound, any swelling or bruising at the site, or you develop a fever > 101? F after you are discharged home, call the office at once.  Special Instructions - You are still able to use cellular telephones; use the ear opposite the side where you have your pacemaker/defibrillator.  Avoid carrying your cellular phone near your device. - When traveling through airports, show security personnel your identification card to avoid being screened in the metal detectors.  Ask the security personnel to use the hand wand. - Avoid arc welding equipment, MRI testing (magnetic resonance imaging), TENS units (transcutaneous nerve stimulators).  Call the office for questions about other devices. - Avoid electrical appliances that are in poor condition or are not properly grounded. - Microwave ovens are safe to be near or to operate.

## 2017-02-19 ENCOUNTER — Telehealth: Payer: Self-pay | Admitting: Internal Medicine

## 2017-02-19 NOTE — Telephone Encounter (Signed)
Spoke with pt and informed him that he is ok to remove the clear dressing and leave the steri-strips intact. Pt voiced understanding.

## 2017-02-19 NOTE — Telephone Encounter (Signed)
New message    Patient calling with questions regarding post op bandages.  He wants to know should he remove the entire dressing or just the top barrier.  Please call.

## 2017-02-25 ENCOUNTER — Ambulatory Visit: Payer: Medicare Other

## 2017-02-26 ENCOUNTER — Ambulatory Visit (INDEPENDENT_AMBULATORY_CARE_PROVIDER_SITE_OTHER): Payer: Medicare Other | Admitting: *Deleted

## 2017-02-26 DIAGNOSIS — R001 Bradycardia, unspecified: Secondary | ICD-10-CM

## 2017-02-26 LAB — CUP PACEART INCLINIC DEVICE CHECK
Battery Voltage: 3.07 V
Brady Statistic RA Percent Paced: 84 %
Brady Statistic RV Percent Paced: 2.1 %
Implantable Lead Implant Date: 20181211
Implantable Lead Location: 753860
Lead Channel Impedance Value: 537.5 Ohm
Lead Channel Pacing Threshold Amplitude: 0.75 V
Lead Channel Pacing Threshold Amplitude: 1.25 V
Lead Channel Pacing Threshold Amplitude: 1.25 V
Lead Channel Pacing Threshold Pulse Width: 0.5 ms
Lead Channel Pacing Threshold Pulse Width: 0.5 ms
Lead Channel Sensing Intrinsic Amplitude: 10.1 mV
Lead Channel Sensing Intrinsic Amplitude: 5 mV
Lead Channel Setting Pacing Amplitude: 1.625
Lead Channel Setting Pacing Pulse Width: 0.5 ms
Lead Channel Setting Sensing Sensitivity: 2 mV
MDC IDC LEAD IMPLANT DT: 20181211
MDC IDC LEAD LOCATION: 753859
MDC IDC MSMT BATTERY REMAINING LONGEVITY: 127 mo
MDC IDC MSMT LEADCHNL RA IMPEDANCE VALUE: 562.5 Ohm
MDC IDC MSMT LEADCHNL RA PACING THRESHOLD AMPLITUDE: 0.75 V
MDC IDC MSMT LEADCHNL RA PACING THRESHOLD PULSEWIDTH: 0.5 ms
MDC IDC MSMT LEADCHNL RV PACING THRESHOLD PULSEWIDTH: 0.5 ms
MDC IDC PG IMPLANT DT: 20181211
MDC IDC SESS DTM: 20181221105320
MDC IDC SET LEADCHNL RV PACING AMPLITUDE: 3.5 V
Pulse Gen Model: 2272
Pulse Gen Serial Number: 8967264

## 2017-02-26 NOTE — Progress Notes (Signed)
Wound check appointment. Steri-strips removed. Wound without redness or edema. Incision edges approximated. Inferior to the incision, 2 small abrasions noted, likely from dressing removal. I instructed the patient to apply antibiotic ointment to those areas.  Normal device function. Thresholds, sensing, and impedances consistent with implant measurements. Device programmed at 3.5V in RV and atrial auto capture programmed on for extra safety margin until 3 month visit. Histogram distribution appropriate for patient and level of activity. 10 mode switches, longest 70 seconds, EGMs show ATach. No high ventricular rates noted. Patient educated about wound care, arm mobility, lifting restrictions. Merlin monitor communicating. ROV with JA 05/26/17.

## 2017-05-20 DIAGNOSIS — D0461 Carcinoma in situ of skin of right upper limb, including shoulder: Secondary | ICD-10-CM | POA: Diagnosis not present

## 2017-05-20 DIAGNOSIS — D0462 Carcinoma in situ of skin of left upper limb, including shoulder: Secondary | ICD-10-CM | POA: Diagnosis not present

## 2017-05-20 DIAGNOSIS — D485 Neoplasm of uncertain behavior of skin: Secondary | ICD-10-CM | POA: Diagnosis not present

## 2017-05-20 DIAGNOSIS — Z419 Encounter for procedure for purposes other than remedying health state, unspecified: Secondary | ICD-10-CM | POA: Diagnosis not present

## 2017-05-20 DIAGNOSIS — I788 Other diseases of capillaries: Secondary | ICD-10-CM | POA: Diagnosis not present

## 2017-05-26 ENCOUNTER — Ambulatory Visit (INDEPENDENT_AMBULATORY_CARE_PROVIDER_SITE_OTHER): Payer: Medicare Other | Admitting: Internal Medicine

## 2017-05-26 ENCOUNTER — Encounter: Payer: Self-pay | Admitting: Internal Medicine

## 2017-05-26 VITALS — BP 138/78 | HR 80 | Ht 74.0 in | Wt 211.6 lb

## 2017-05-26 DIAGNOSIS — Z950 Presence of cardiac pacemaker: Secondary | ICD-10-CM

## 2017-05-26 DIAGNOSIS — R001 Bradycardia, unspecified: Secondary | ICD-10-CM

## 2017-05-26 DIAGNOSIS — I481 Persistent atrial fibrillation: Secondary | ICD-10-CM | POA: Diagnosis not present

## 2017-05-26 DIAGNOSIS — I4819 Other persistent atrial fibrillation: Secondary | ICD-10-CM

## 2017-05-26 LAB — CUP PACEART INCLINIC DEVICE CHECK
Battery Voltage: 3.01 V
Brady Statistic RA Percent Paced: 71 %
Brady Statistic RV Percent Paced: 12 %
Date Time Interrogation Session: 20190320114228
Implantable Lead Implant Date: 20181211
Implantable Lead Location: 753859
Implantable Pulse Generator Implant Date: 20181211
Lead Channel Impedance Value: 525 Ohm
Lead Channel Pacing Threshold Amplitude: 0.625 V
Lead Channel Pacing Threshold Amplitude: 0.75 V
Lead Channel Pacing Threshold Pulse Width: 0.5 ms
Lead Channel Pacing Threshold Pulse Width: 0.5 ms
Lead Channel Setting Pacing Amplitude: 2.5 V
Lead Channel Setting Sensing Sensitivity: 2 mV
MDC IDC LEAD IMPLANT DT: 20181211
MDC IDC LEAD LOCATION: 753860
MDC IDC MSMT BATTERY REMAINING LONGEVITY: 117 mo
MDC IDC MSMT LEADCHNL RA IMPEDANCE VALUE: 537.5 Ohm
MDC IDC MSMT LEADCHNL RA SENSING INTR AMPL: 5 mV
MDC IDC MSMT LEADCHNL RV PACING THRESHOLD AMPLITUDE: 0.75 V
MDC IDC MSMT LEADCHNL RV PACING THRESHOLD PULSEWIDTH: 0.5 ms
MDC IDC MSMT LEADCHNL RV SENSING INTR AMPL: 11.3 mV
MDC IDC SET LEADCHNL RA PACING AMPLITUDE: 1.625
MDC IDC SET LEADCHNL RV PACING PULSEWIDTH: 0.5 ms
Pulse Gen Model: 2272
Pulse Gen Serial Number: 8967264

## 2017-05-26 NOTE — Patient Instructions (Addendum)
Medication Instructions:  Your physician recommends that you continue on your current medications as directed. Please refer to the Current Medication list given to you today.  Labwork: None ordered.  Testing/Procedures: None ordered.  Follow-Up: Your physician wants you to follow-up in: 5 months with Dr. Rayann Heman.   You will receive a reminder letter in the mail two months in advance. If you don't receive a letter, please call our office to schedule the follow-up appointment.  Remote monitoring is used to monitor your Pacemaker from home. This monitoring reduces the number of office visits required to check your device to one time per year. It allows [place] to keep an eye on the functioning of your device to ensure it is working properly. You are scheduled for a device check from home on 08/25/2017. You may send your transmission at any time that day. If you have a wireless device, the transmission will be sent automatically. After your physician reviews your transmission, you will receive a postcard with your next transmission date.  Any Other Special Instructions Will Be Listed Below (If Applicable).  If you need a refill on your cardiac medications before your next appointment, please call your pharmacy.

## 2017-05-26 NOTE — Progress Notes (Signed)
PCP: Todd Huddle, MD Primary Cardiologist:  Dr Todd Chapman Primary EP:  Dr Todd Chapman is a 72 y.o. male who presents today for routine electrophysiology followup.  Since PPM implant, the patient reports doing very well.  Today, he denies symptoms of palpitations, chest pain, shortness of breath,  lower extremity edema, dizziness, presyncope, or syncope.  The patient is otherwise without complaint today.   Past Medical History:  Diagnosis Date  . Arthritis    "thumbs, back" (02/16/2017)  . GERD (gastroesophageal reflux disease)   . HLD (hyperlipidemia)   . Persistent atrial fibrillation ([facility]) 12/18/2016  . Pneumonia 1999; 2018  . Presence of permanent cardiac pacemaker    Past Surgical History:  Procedure Laterality Date  . APPENDECTOMY     age 68  . CARDIOVERSION  02/16/2017  . CARDIOVERSION N/A 02/16/2017   Procedure: CARDIOVERSION;  Surgeon: Todd Grayer, MD;  Location: [facility];  Service: Cardiovascular;  Laterality: N/A;  . COLONOSCOPY WITH PROPOFOL N/A 12/13/2012   Procedure: COLONOSCOPY WITH PROPOFOL;  Surgeon: Todd Fair, MD;  Location: [facility];  Service: [facility];  Laterality: N/A;  . FRACTURE SURGERY    . INSERT / REPLACE / REMOVE PACEMAKER  02/16/2017  . PACEMAKER IMPLANT N/A 02/16/2017   Procedure: PACEMAKER IMPLANT;  Surgeon: Todd Grayer, MD;  Location: [facility];  Service: Cardiovascular;  Laterality: N/A;  . WRIST FRACTURE SURGERY Left    with pins    ROS- all systems are reviewed and negative except as per HPI above  Current Outpatient Medications  Medication Sig Dispense Refill  . acetaminophen (TYLENOL) 500 MG tablet Take 500 mg by mouth daily as needed for mild pain.    Marland Kitchen apixaban (ELIQUIS) 5 MG TABS tablet Take 1 tablet (5 mg total) by mouth 2 (two) times daily. 180 tablet 3  . ibuprofen (ADVIL,MOTRIN) 200 MG tablet Take 200-400 mg by mouth daily as needed for moderate pain.     Marland Kitchen loratadine (CLARITIN) 10 MG  tablet Take 10 mg by mouth daily as needed for allergies.    . Multiple Vitamin (MULTIVITAMIN WITH MINERALS) TABS tablet Take 1 tablet by mouth daily.    . Multiple Vitamins-Minerals (EYE VITAMINS) CAPS Take 1 capsule by mouth daily.     . mupirocin ointment (BACTROBAN) 2 % Place 1 application into the nose daily as needed (dry nose).    . pantoprazole (PROTONIX) 40 MG tablet Take 40 mg daily by mouth.  2  . rosuvastatin (CRESTOR) 10 MG tablet Take 10 mg by mouth every morning.     No current facility-administered medications for this visit.     Physical Exam: Vitals:   05/26/17 1104  BP: 138/78  Pulse: 80  SpO2: 99%  Weight: 211 lb 9.6 oz (96 kg)  Height: 6\' 2"  (1.88 m)    GEN- The patient is well appearing, alert and oriented x 3 today.   Head- normocephalic, atraumatic Eyes-  Sclera clear, conjunctiva pink Ears- hearing intact Oropharynx- clear Lungs- Clear to ausculation bilaterally, normal work of breathing Chest- pacemaker pocket is well healed Heart- irregular rate and rhythm, no murmurs, rubs or gallops, PMI not laterally displaced GI- soft, NT, ND, + BS Extremities- no clubbing, cyanosis, or edema  Pacemaker interrogation- reviewed in detail today,  See PACEART report  ekg tracing ordered today is personally reviewed and shows afib, V rate 63 bpm  Assessment and Plan:  1. Symptomatic bradycardia  Normal pacemaker function See [facility] Art  report No changes today  2. Persistent atrial fibrillation The patient continues to have afib but is mostly unaware.  He maintained sinus for a couple months after cardioversion with device implant but appears to have returned to persistent afib over the last few weeks. Remains active and is exercising. Does not wish to consider AAD therapy currently. He is on eliquis for chads2vasc score of 1.  [facility] Return to see me in 5 months to further assess AF He is given info regarding afib clinic should he have AF issues in the  interim.  Todd Grayer MD, [facility] 05/26/2017 11:22 AM

## 2017-06-09 DIAGNOSIS — D3132 Benign neoplasm of left choroid: Secondary | ICD-10-CM | POA: Diagnosis not present

## 2017-06-09 DIAGNOSIS — H353131 Nonexudative age-related macular degeneration, bilateral, early dry stage: Secondary | ICD-10-CM | POA: Diagnosis not present

## 2017-06-09 DIAGNOSIS — H2513 Age-related nuclear cataract, bilateral: Secondary | ICD-10-CM | POA: Diagnosis not present

## 2017-06-17 DIAGNOSIS — Z950 Presence of cardiac pacemaker: Secondary | ICD-10-CM | POA: Diagnosis not present

## 2017-06-17 DIAGNOSIS — N433 Hydrocele, unspecified: Secondary | ICD-10-CM | POA: Diagnosis not present

## 2017-06-17 DIAGNOSIS — I4891 Unspecified atrial fibrillation: Secondary | ICD-10-CM | POA: Diagnosis not present

## 2017-07-26 DIAGNOSIS — N430 Encysted hydrocele: Secondary | ICD-10-CM | POA: Diagnosis not present

## 2017-08-05 DIAGNOSIS — L570 Actinic keratosis: Secondary | ICD-10-CM | POA: Diagnosis not present

## 2017-08-05 DIAGNOSIS — L820 Inflamed seborrheic keratosis: Secondary | ICD-10-CM | POA: Diagnosis not present

## 2017-08-05 DIAGNOSIS — D225 Melanocytic nevi of trunk: Secondary | ICD-10-CM | POA: Diagnosis not present

## 2017-08-05 DIAGNOSIS — Z85828 Personal history of other malignant neoplasm of skin: Secondary | ICD-10-CM | POA: Diagnosis not present

## 2017-08-05 DIAGNOSIS — L821 Other seborrheic keratosis: Secondary | ICD-10-CM | POA: Diagnosis not present

## 2017-08-25 ENCOUNTER — Ambulatory Visit (INDEPENDENT_AMBULATORY_CARE_PROVIDER_SITE_OTHER): Payer: Medicare Other | Admitting: *Deleted

## 2017-08-25 ENCOUNTER — Telehealth: Payer: Self-pay | Admitting: Cardiology

## 2017-08-25 DIAGNOSIS — R001 Bradycardia, unspecified: Secondary | ICD-10-CM | POA: Diagnosis not present

## 2017-08-25 DIAGNOSIS — N430 Encysted hydrocele: Secondary | ICD-10-CM | POA: Diagnosis not present

## 2017-08-25 NOTE — Telephone Encounter (Signed)
LMOVM reminding pt to send remote transmission.   

## 2017-08-26 ENCOUNTER — Encounter: Payer: Self-pay | Admitting: Cardiology

## 2017-08-26 NOTE — Progress Notes (Signed)
Remote pacemaker transmission.   

## 2017-09-14 LAB — CUP PACEART REMOTE DEVICE CHECK
Battery Voltage: 3.01 V
Brady Statistic AP VP Percent: 0 %
Brady Statistic AS VP Percent: 0 %
Brady Statistic AS VS Percent: 0 %
Brady Statistic RV Percent Paced: 54 %
Implantable Lead Implant Date: 20181211
Implantable Lead Location: 753859
Implantable Pulse Generator Implant Date: 20181211
Lead Channel Impedance Value: 490 Ohm
Lead Channel Pacing Threshold Amplitude: 0.625 V
Lead Channel Pacing Threshold Amplitude: 0.75 V
Lead Channel Pacing Threshold Pulse Width: 0.5 ms
Lead Channel Sensing Intrinsic Amplitude: 12 mV
Lead Channel Setting Pacing Amplitude: 2.5 V
Lead Channel Setting Pacing Pulse Width: 0.5 ms
Lead Channel Setting Sensing Sensitivity: 2 mV
MDC IDC LEAD IMPLANT DT: 20181211
MDC IDC LEAD LOCATION: 753860
MDC IDC MSMT BATTERY REMAINING LONGEVITY: 114 mo
MDC IDC MSMT BATTERY REMAINING PERCENTAGE: 95.5 %
MDC IDC MSMT LEADCHNL RA IMPEDANCE VALUE: 540 Ohm
MDC IDC MSMT LEADCHNL RA PACING THRESHOLD PULSEWIDTH: 0.5 ms
MDC IDC MSMT LEADCHNL RA SENSING INTR AMPL: 5 mV
MDC IDC PG SERIAL: 8967264
MDC IDC SESS DTM: 20190620024014
MDC IDC SET LEADCHNL RA PACING AMPLITUDE: 1.625
MDC IDC STAT BRADY AP VS PERCENT: 0 %
MDC IDC STAT BRADY RA PERCENT PACED: 1 %

## 2017-09-29 DIAGNOSIS — L820 Inflamed seborrheic keratosis: Secondary | ICD-10-CM | POA: Diagnosis not present

## 2017-09-29 DIAGNOSIS — L821 Other seborrheic keratosis: Secondary | ICD-10-CM | POA: Diagnosis not present

## 2017-09-29 DIAGNOSIS — D485 Neoplasm of uncertain behavior of skin: Secondary | ICD-10-CM | POA: Diagnosis not present

## 2017-10-11 ENCOUNTER — Encounter: Payer: Self-pay | Admitting: Internal Medicine

## 2017-10-11 ENCOUNTER — Ambulatory Visit (INDEPENDENT_AMBULATORY_CARE_PROVIDER_SITE_OTHER): Payer: Medicare Other | Admitting: Internal Medicine

## 2017-10-11 VITALS — BP 108/72 | HR 67 | Ht 74.0 in | Wt 212.8 lb

## 2017-10-11 DIAGNOSIS — Z950 Presence of cardiac pacemaker: Secondary | ICD-10-CM | POA: Diagnosis not present

## 2017-10-11 DIAGNOSIS — I481 Persistent atrial fibrillation: Secondary | ICD-10-CM | POA: Diagnosis not present

## 2017-10-11 DIAGNOSIS — R001 Bradycardia, unspecified: Secondary | ICD-10-CM | POA: Diagnosis not present

## 2017-10-11 DIAGNOSIS — I4819 Other persistent atrial fibrillation: Secondary | ICD-10-CM

## 2017-10-11 NOTE — Patient Instructions (Addendum)
Medication Instructions:  Your physician recommends that you continue on your current medications as directed. Please refer to the Current Medication list given to you today.  Labwork: None ordered.  Testing/Procedures: None ordered.  Follow-Up: Your physician wants you to follow-up in: 6 months with Dr. Rayann Heman.   You will receive a reminder letter in the mail two months in advance. If you don't receive a letter, please call our office to schedule the follow-up appointment.  Remote monitoring is used to monitor your Pacemaker from home. This monitoring reduces the number of office visits required to check your device to one time per year. It allows [place] to keep an eye on the functioning of your device to ensure it is working properly. You are scheduled for a device check from home on 11/24/2017. You may send your transmission at any time that day. If you have a wireless device, the transmission will be sent automatically. After your physician reviews your transmission, you will receive a postcard with your next transmission date.  Any Other Special Instructions Will Be Listed Below (If Applicable).  If you need a refill on your cardiac medications before your next appointment, please call your pharmacy.

## 2017-10-11 NOTE — Progress Notes (Signed)
PCP: [name], MD Primary Cardiologist: Dr Marlou Porch Primary EP:  Dr Tawny Hopping is a 72 y.o. male who presents today for routine electrophysiology followup.  Since last being seen in our clinic, the patient reports doing very well.  He remains very active.  he is overall pleased with his current health state.  + sob with moderate activity..  He does have fatigue after a long day.  Today, he denies symptoms of palpitations, chest pain, shortness of breath,  lower extremity edema, dizziness, presyncope, or syncope.  The patient is otherwise without complaint today.   Past Medical History:  Diagnosis Date  . Arthritis    "thumbs, back" (02/16/2017)  . GERD (gastroesophageal reflux disease)   . HLD (hyperlipidemia)   . Persistent atrial fibrillation ([place]) 12/18/2016  . Pneumonia 1999; 2018   Past Surgical History:  Procedure Laterality Date  . APPENDECTOMY     age 28  . CARDIOVERSION  02/16/2017  . CARDIOVERSION N/A 02/16/2017   Procedure: CARDIOVERSION;  Surgeon: Thompson Grayer, MD;  Location: [place] [place] [facility];  Service: Cardiovascular;  Laterality: N/A;  . COLONOSCOPY WITH PROPOFOL N/A 12/13/2012   Procedure: COLONOSCOPY WITH PROPOFOL;  Surgeon: Garlan Fair, MD;  Location: [facility];  Service: Endoscopy;  Laterality: N/A;  . FRACTURE SURGERY    . INSERT / REPLACE / REMOVE PACEMAKER  02/16/2017  . 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 WRIST FRACTURE SURGERY Left    with pins    ROS- all systems are reviewed and negative except as per HPI above  Current Outpatient Medications  Medication Sig Dispense Refill  . acetaminophen (TYLENOL) 500 MG tablet Take 500 mg by mouth daily as needed for mild pain.    Marland Kitchen apixaban (ELIQUIS) 5 MG TABS tablet Take 1 tablet (5 mg total) by mouth 2 (two) times daily. 180 tablet 3  . ibuprofen (ADVIL,MOTRIN) 200 MG  tablet Take 200-400 mg by mouth daily as needed for moderate pain.     Marland Kitchen loratadine (CLARITIN) 10 MG tablet Take 10 mg by mouth daily as needed for allergies.    . Multiple Vitamin (MULTIVITAMIN WITH MINERALS) TABS tablet Take 1 tablet by mouth daily.    . Multiple Vitamins-Minerals (EYE VITAMINS) CAPS Take 1 capsule by mouth daily.     . mupirocin ointment (BACTROBAN) 2 % Place 1 application into the nose daily as needed (dry nose).    . pantoprazole (PROTONIX) 40 MG tablet Take 40 mg daily by mouth.  2  . rosuvastatin (CRESTOR) 10 MG tablet Take 10 mg by mouth every morning.     No current facility-administered medications for this visit.     Physical Exam: Vitals:   10/11/17 1421  BP: 108/72  Pulse: 67  SpO2: 97%  Weight: 212 lb 12.8 oz (96.5 kg)  Height: 6\' 2"  (1.88 m)    GEN- The patient is well appearing, alert and oriented x 3 today.   Head- normocephalic, atraumatic Eyes-  Sclera clear, conjunctiva pink Ears- hearing intact Oropharynx- clear Lungs- Clear to ausculation bilaterally, normal work of breathing Chest- pacemaker pocket is well healed Heart- irregular rate and rhythm, no murmurs, rubs or gallops, PMI not laterally displaced GI- soft, NT, ND, + BS Extremities- no clubbing, cyanosis, or edema  Pacemaker interrogation- reviewed in detail today,  See PACEART report  ekg tracing ordered today is personally reviewed and shows  afib, V rate 67 bpm,   Assessment and Plan:  1. Symptomatic bradycardia  Normal pacemaker function See Pace Art report No changes today  2. Persistent atrial fibrillation Rate controlled Remains active and exercising.  We discussed tikosyn today.  He would prefer to continue our current approach. chads2vasc score is 1.  On eliquis  Merlin Return in 6 months to see me  Thompson Grayer MD, [facility] 10/11/2017 2:30 PM

## 2017-10-14 NOTE — Addendum Note (Signed)
Addended by: Jaimon Bugaj H on: 10/14/2017 01:44 PM   Modules accepted: Orders  

## 2017-11-04 DIAGNOSIS — M461 Sacroiliitis, not elsewhere classified: Secondary | ICD-10-CM | POA: Diagnosis not present

## 2017-11-04 DIAGNOSIS — M79604 Pain in right leg: Secondary | ICD-10-CM | POA: Diagnosis not present

## 2017-11-10 ENCOUNTER — Encounter: Payer: Medicare Other | Admitting: Internal Medicine

## 2017-11-24 ENCOUNTER — Encounter: Payer: Medicare Other | Admitting: *Deleted

## 2017-11-24 ENCOUNTER — Telehealth: Payer: Self-pay | Admitting: Cardiology

## 2017-11-24 NOTE — Telephone Encounter (Signed)
LMOVM reminding pt to send remote transmission.   

## 2017-11-25 ENCOUNTER — Encounter: Payer: Self-pay | Admitting: Cardiology

## 2017-11-25 NOTE — Progress Notes (Signed)
Letter  

## 2017-11-30 LAB — CUP PACEART INCLINIC DEVICE CHECK
Battery Voltage: 3.01 V
Brady Statistic RA Percent Paced: 0 %
Implantable Lead Implant Date: 20181211
Implantable Lead Location: 753860
Lead Channel Impedance Value: 487.5 Ohm
Lead Channel Sensing Intrinsic Amplitude: 12 mV
Lead Channel Sensing Intrinsic Amplitude: 5 mV
Lead Channel Setting Pacing Amplitude: 2.5 V
Lead Channel Setting Pacing Pulse Width: 0.5 ms
Lead Channel Setting Sensing Sensitivity: 2 mV
MDC IDC LEAD IMPLANT DT: 20181211
MDC IDC LEAD LOCATION: 753859
MDC IDC MSMT BATTERY REMAINING LONGEVITY: 118 mo
MDC IDC MSMT LEADCHNL RA IMPEDANCE VALUE: 537.5 Ohm
MDC IDC MSMT LEADCHNL RV PACING THRESHOLD AMPLITUDE: 0.75 V
MDC IDC MSMT LEADCHNL RV PACING THRESHOLD PULSEWIDTH: 0.5 ms
MDC IDC PG IMPLANT DT: 20181211
MDC IDC SESS DTM: 20190805182212
MDC IDC SET LEADCHNL RA PACING AMPLITUDE: 1.625
MDC IDC STAT BRADY RV PERCENT PACED: 57 %
Pulse Gen Serial Number: 8967264

## 2017-12-19 ENCOUNTER — Other Ambulatory Visit: Payer: Self-pay | Admitting: Internal Medicine

## 2017-12-19 DIAGNOSIS — I4819 Other persistent atrial fibrillation: Secondary | ICD-10-CM

## 2017-12-20 NOTE — Telephone Encounter (Signed)
Pt last saw Dr Rayann Heman 10/11/17, last labs 02/17/17 Creat 0.87, age 72, weight 96.5kg, based on specified criteria pt is on appropriate dosage of Eliquis 5mg  BID.  Will refill rx.

## 2017-12-22 ENCOUNTER — Encounter: Payer: Self-pay | Admitting: Internal Medicine

## 2017-12-25 DIAGNOSIS — Z23 Encounter for immunization: Secondary | ICD-10-CM | POA: Diagnosis not present

## 2018-01-07 DIAGNOSIS — Z0000 Encounter for general adult medical examination without abnormal findings: Secondary | ICD-10-CM | POA: Diagnosis not present

## 2018-01-17 ENCOUNTER — Ambulatory Visit (INDEPENDENT_AMBULATORY_CARE_PROVIDER_SITE_OTHER): Payer: Medicare Other | Admitting: *Deleted

## 2018-01-17 DIAGNOSIS — I4819 Other persistent atrial fibrillation: Secondary | ICD-10-CM

## 2018-01-17 DIAGNOSIS — R001 Bradycardia, unspecified: Secondary | ICD-10-CM | POA: Diagnosis not present

## 2018-01-17 NOTE — Progress Notes (Signed)
Remote pacemaker transmission.   

## 2018-02-10 DIAGNOSIS — K5730 Diverticulosis of large intestine without perforation or abscess without bleeding: Secondary | ICD-10-CM | POA: Diagnosis not present

## 2018-02-10 DIAGNOSIS — Z950 Presence of cardiac pacemaker: Secondary | ICD-10-CM | POA: Diagnosis not present

## 2018-02-10 DIAGNOSIS — G4700 Insomnia, unspecified: Secondary | ICD-10-CM | POA: Diagnosis not present

## 2018-02-10 DIAGNOSIS — M545 Low back pain: Secondary | ICD-10-CM | POA: Diagnosis not present

## 2018-02-10 DIAGNOSIS — Z0001 Encounter for general adult medical examination with abnormal findings: Secondary | ICD-10-CM | POA: Diagnosis not present

## 2018-02-10 DIAGNOSIS — E559 Vitamin D deficiency, unspecified: Secondary | ICD-10-CM | POA: Diagnosis not present

## 2018-02-10 DIAGNOSIS — E785 Hyperlipidemia, unspecified: Secondary | ICD-10-CM | POA: Diagnosis not present

## 2018-02-10 DIAGNOSIS — Z125 Encounter for screening for malignant neoplasm of prostate: Secondary | ICD-10-CM | POA: Diagnosis not present

## 2018-02-10 DIAGNOSIS — M461 Sacroiliitis, not elsewhere classified: Secondary | ICD-10-CM | POA: Diagnosis not present

## 2018-02-10 DIAGNOSIS — Z79899 Other long term (current) drug therapy: Secondary | ICD-10-CM | POA: Diagnosis not present

## 2018-02-10 DIAGNOSIS — K219 Gastro-esophageal reflux disease without esophagitis: Secondary | ICD-10-CM | POA: Diagnosis not present

## 2018-02-10 DIAGNOSIS — Z86010 Personal history of colonic polyps: Secondary | ICD-10-CM | POA: Diagnosis not present

## 2018-03-07 DIAGNOSIS — M461 Sacroiliitis, not elsewhere classified: Secondary | ICD-10-CM | POA: Diagnosis not present

## 2018-03-07 DIAGNOSIS — M25561 Pain in right knee: Secondary | ICD-10-CM | POA: Diagnosis not present

## 2018-03-18 ENCOUNTER — Other Ambulatory Visit: Payer: Self-pay | Admitting: Urology

## 2018-03-20 LAB — CUP PACEART REMOTE DEVICE CHECK
Battery Remaining Longevity: 111 mo
Battery Remaining Percentage: 95.5 %
Battery Voltage: 3.01 V
Brady Statistic RA Percent Paced: 1 %
Date Time Interrogation Session: 20191110234423
Implantable Lead Implant Date: 20181211
Implantable Lead Implant Date: 20181211
Implantable Lead Location: 753860
Implantable Pulse Generator Implant Date: 20181211
Lead Channel Impedance Value: 480 Ohm
Lead Channel Pacing Threshold Amplitude: 0.625 V
Lead Channel Pacing Threshold Pulse Width: 0.5 ms
Lead Channel Sensing Intrinsic Amplitude: 5 mV
MDC IDC LEAD LOCATION: 753859
MDC IDC MSMT LEADCHNL RV IMPEDANCE VALUE: 480 Ohm
MDC IDC MSMT LEADCHNL RV PACING THRESHOLD AMPLITUDE: 0.75 V
MDC IDC MSMT LEADCHNL RV PACING THRESHOLD PULSEWIDTH: 0.5 ms
MDC IDC MSMT LEADCHNL RV SENSING INTR AMPL: 10.9 mV
MDC IDC SET LEADCHNL RA PACING AMPLITUDE: 1.625
MDC IDC SET LEADCHNL RV PACING AMPLITUDE: 2.5 V
MDC IDC SET LEADCHNL RV PACING PULSEWIDTH: 0.5 ms
MDC IDC SET LEADCHNL RV SENSING SENSITIVITY: 2 mV
MDC IDC STAT BRADY AP VP PERCENT: 0 %
MDC IDC STAT BRADY AP VS PERCENT: 0 %
MDC IDC STAT BRADY AS VP PERCENT: 0 %
MDC IDC STAT BRADY AS VS PERCENT: 0 %
MDC IDC STAT BRADY RV PERCENT PACED: 41 %
Pulse Gen Model: 2272
Pulse Gen Serial Number: 8967264

## 2018-04-13 DIAGNOSIS — N430 Encysted hydrocele: Secondary | ICD-10-CM | POA: Diagnosis not present

## 2018-04-14 ENCOUNTER — Telehealth: Payer: Self-pay | Admitting: Internal Medicine

## 2018-04-14 NOTE — Telephone Encounter (Signed)
° °  [facility] Pre-operative Risk Assessment    Request for surgical clearance:  1. What type of surgery is being performed? Left hydrocelectomy  2. When is this surgery scheduled? 05/16/18  3. What type of clearance is required (medical clearance vs. Pharmacy clearance to hold med vs. Both)? medication  4. Are there any medications that need to be held prior to surgery and how long? ELIQUIS hold for 48 hours prior to surgery  5. Practice name and name of physician performing surgery? Dr. [name]  6. What is your office phone number [phone] ext [phone]   7.   What is your office fax number [phone]  8.   Anesthesia type (None, local, MAC, general) ? General    [facility] 04/14/2018, 11:34 AM  _________________________________________________________________   (provider comments below)

## 2018-04-18 ENCOUNTER — Ambulatory Visit (INDEPENDENT_AMBULATORY_CARE_PROVIDER_SITE_OTHER): Payer: Medicare Other

## 2018-04-18 DIAGNOSIS — I495 Sick sinus syndrome: Secondary | ICD-10-CM

## 2018-04-18 DIAGNOSIS — R001 Bradycardia, unspecified: Secondary | ICD-10-CM

## 2018-04-18 LAB — CUP PACEART REMOTE DEVICE CHECK
Battery Remaining Longevity: 118 mo
Brady Statistic AP VS Percent: 0 %
Brady Statistic AS VP Percent: 64 %
Brady Statistic RV Percent Paced: 38 %
Implantable Lead Implant Date: 20181211
Lead Channel Pacing Threshold Amplitude: 0.625 V
Lead Channel Pacing Threshold Amplitude: 0.75 V
Lead Channel Pacing Threshold Pulse Width: 0.5 ms
Lead Channel Sensing Intrinsic Amplitude: 5 mV
Lead Channel Sensing Intrinsic Amplitude: 7.2 mV
Lead Channel Setting Pacing Amplitude: 2.5 V
Lead Channel Setting Pacing Pulse Width: 0.5 ms
MDC IDC LEAD IMPLANT DT: 20181211
MDC IDC LEAD LOCATION: 753859
MDC IDC LEAD LOCATION: 753860
MDC IDC MSMT BATTERY REMAINING PERCENTAGE: 95.5 %
MDC IDC MSMT BATTERY VOLTAGE: 3.01 V
MDC IDC MSMT LEADCHNL RA IMPEDANCE VALUE: 490 Ohm
MDC IDC MSMT LEADCHNL RA PACING THRESHOLD PULSEWIDTH: 0.5 ms
MDC IDC MSMT LEADCHNL RV IMPEDANCE VALUE: 460 Ohm
MDC IDC PG IMPLANT DT: 20181211
MDC IDC SESS DTM: 20200210070015
MDC IDC SET LEADCHNL RA PACING AMPLITUDE: 1.625
MDC IDC SET LEADCHNL RV SENSING SENSITIVITY: 2 mV
MDC IDC STAT BRADY AP VP PERCENT: 0 %
MDC IDC STAT BRADY AS VS PERCENT: 36 %
MDC IDC STAT BRADY RA PERCENT PACED: 1 %
Pulse Gen Model: 2272
Pulse Gen Serial Number: 8967264

## 2018-04-18 NOTE — Telephone Encounter (Signed)
Left message for the patient to call back and speak to on call preop APP

## 2018-04-18 NOTE — Telephone Encounter (Signed)
Patient takes Eliquis for afib with CHADS2VASc score of 2 (age x2). Renal function is normal (SCr 0.9 in [facility] from December 2019). Ok to hold Eliquis for 2 days prior to procedure as requested.

## 2018-04-25 ENCOUNTER — Encounter: Payer: Self-pay | Admitting: Internal Medicine

## 2018-04-25 ENCOUNTER — Encounter: Payer: Medicare Other | Admitting: Internal Medicine

## 2018-04-25 NOTE — Telephone Encounter (Signed)
Given past medical history and time since last visit, based on ACC/AHA guidelines, [facility] would be at acceptable risk for the planned procedure without further cardiovascular testing. He is walking 3 miles in AM and does weight lifting in evening.   I will route this recommendation to the requesting party via Epic fax function and remove from pre-op pool.  Please call with questions.  [place], [place] 04/25/2018, 1:01 PM

## 2018-04-25 NOTE — Telephone Encounter (Signed)
Left Voicemail to call back

## 2018-04-25 NOTE — Telephone Encounter (Signed)
Follow up    Patient is returning call in reference to pre-op clearance. Please call [phone].

## 2018-04-28 ENCOUNTER — Encounter: Payer: Self-pay | Admitting: Internal Medicine

## 2018-04-28 NOTE — Progress Notes (Signed)
Remote pacemaker transmission.   

## 2018-05-04 DIAGNOSIS — R509 Fever, unspecified: Secondary | ICD-10-CM | POA: Diagnosis not present

## 2018-05-04 DIAGNOSIS — R6889 Other general symptoms and signs: Secondary | ICD-10-CM | POA: Diagnosis not present

## 2018-05-13 ENCOUNTER — Encounter (HOSPITAL_BASED_OUTPATIENT_CLINIC_OR_DEPARTMENT_OTHER): Payer: Self-pay | Admitting: *Deleted

## 2018-05-13 ENCOUNTER — Other Ambulatory Visit: Payer: Self-pay

## 2018-05-13 NOTE — Progress Notes (Signed)
Spoke with Marton Npo after midnight, arrive 830am 05-16-18 [facility] med[facility] to take sip of water:pantaprazole, rosuvastatin Records on chart/epic: device check note 04-18-18, ekg 10-11-17, lov dr Rayann Heman 10-11-17, cardiac clearance for 05-16-18 surgery bhavkimar bhagat pa 04-25-18 Needs I stat 4 Has surgery orders in epic Driver wife vicki

## 2018-05-16 ENCOUNTER — Encounter (HOSPITAL_BASED_OUTPATIENT_CLINIC_OR_DEPARTMENT_OTHER)
Admission: RE | Disposition: A | Discharge status: Home / Self Care | Payer: Self-pay | Source: Home / Self Care | Attending: Urology

## 2018-05-16 ENCOUNTER — Ambulatory Visit (HOSPITAL_BASED_OUTPATIENT_CLINIC_OR_DEPARTMENT_OTHER): Payer: Medicare Other | Admitting: Anesthesiology

## 2018-05-16 ENCOUNTER — Ambulatory Visit (HOSPITAL_BASED_OUTPATIENT_CLINIC_OR_DEPARTMENT_OTHER)
Admission: RE | Admit: 2018-05-16 | Discharge: 2018-05-16 | Disposition: A | Discharge status: Home / Self Care | Payer: Medicare Other | Attending: Urology | Admitting: Urology

## 2018-05-16 ENCOUNTER — Encounter (HOSPITAL_BASED_OUTPATIENT_CLINIC_OR_DEPARTMENT_OTHER): Payer: Self-pay | Admitting: Anesthesiology

## 2018-05-16 ENCOUNTER — Other Ambulatory Visit: Payer: Self-pay

## 2018-05-16 DIAGNOSIS — M19041 Primary osteoarthritis, right hand: Secondary | ICD-10-CM | POA: Insufficient documentation

## 2018-05-16 DIAGNOSIS — N430 Encysted hydrocele: Secondary | ICD-10-CM | POA: Diagnosis not present

## 2018-05-16 DIAGNOSIS — I4819 Other persistent atrial fibrillation: Secondary | ICD-10-CM | POA: Diagnosis not present

## 2018-05-16 DIAGNOSIS — Z8249 Family history of ischemic heart disease and other diseases of the circulatory system: Secondary | ICD-10-CM | POA: Diagnosis not present

## 2018-05-16 DIAGNOSIS — Z888 Allergy status to other drugs, medicaments and biological substances status: Secondary | ICD-10-CM | POA: Insufficient documentation

## 2018-05-16 DIAGNOSIS — K219 Gastro-esophageal reflux disease without esophagitis: Secondary | ICD-10-CM | POA: Insufficient documentation

## 2018-05-16 DIAGNOSIS — Z950 Presence of cardiac pacemaker: Secondary | ICD-10-CM | POA: Diagnosis not present

## 2018-05-16 DIAGNOSIS — M19042 Primary osteoarthritis, left hand: Secondary | ICD-10-CM | POA: Diagnosis not present

## 2018-05-16 DIAGNOSIS — M479 Spondylosis, unspecified: Secondary | ICD-10-CM | POA: Insufficient documentation

## 2018-05-16 DIAGNOSIS — Z87891 Personal history of nicotine dependence: Secondary | ICD-10-CM | POA: Diagnosis not present

## 2018-05-16 DIAGNOSIS — Z823 Family history of stroke: Secondary | ICD-10-CM | POA: Diagnosis not present

## 2018-05-16 DIAGNOSIS — E785 Hyperlipidemia, unspecified: Secondary | ICD-10-CM | POA: Diagnosis not present

## 2018-05-16 DIAGNOSIS — N433 Hydrocele, unspecified: Secondary | ICD-10-CM | POA: Insufficient documentation

## 2018-05-16 HISTORY — PX: HYDROCELE EXCISION: SHX482

## 2018-05-16 HISTORY — DX: Dyspnea, unspecified: R06.00

## 2018-05-16 HISTORY — DX: Influenza due to unidentified influenza virus with other respiratory manifestations: J11.1

## 2018-05-16 HISTORY — DX: Hepatitis a without hepatic coma: B15.9

## 2018-05-16 HISTORY — DX: Bradycardia, unspecified: R00.1

## 2018-05-16 LAB — POCT I-STAT 4, (NA,K, GLUC, HGB,HCT)
Glucose, Bld: 105 mg/dL — ABNORMAL HIGH (ref 70–99)
HCT: 40 % (ref 39.0–52.0)
Hemoglobin: 13.6 g/dL (ref 13.0–17.0)
Potassium: 4.3 mmol/L (ref 3.5–5.1)
Sodium: 144 mmol/L (ref 135–145)

## 2018-05-16 SURGERY — HYDROCELECTOMY
Anesthesia: General | Site: Scrotum | Laterality: Left

## 2018-05-16 MED ORDER — DEXAMETHASONE SODIUM PHOSPHATE 10 MG/ML IJ SOLN
INTRAMUSCULAR | Status: DC | PRN
  Administered 2018-05-16: 5 mg via INTRAVENOUS

## 2018-05-16 MED ORDER — CEFAZOLIN SODIUM-DEXTROSE 2-4 GM/100ML-% IV SOLN
INTRAVENOUS | Status: AC
  Filled 2018-05-16: qty 100

## 2018-05-16 MED ORDER — OXYCODONE HCL 5 MG PO TABS
ORAL_TABLET | ORAL | Status: AC
  Filled 2018-05-16: qty 1

## 2018-05-16 MED ORDER — ONDANSETRON HCL 4 MG/2ML IJ SOLN
INTRAMUSCULAR | Status: AC
  Filled 2018-05-16: qty 2

## 2018-05-16 MED ORDER — LACTATED RINGERS IV SOLN
INTRAVENOUS | Status: DC
  Administered 2018-05-16: 1000 mL via INTRAVENOUS
  Filled 2018-05-16: qty 1000

## 2018-05-16 MED ORDER — ONDANSETRON HCL 4 MG/2ML IJ SOLN
4.0000 mg | Freq: Once | INTRAMUSCULAR | Status: DC | PRN
  Filled 2018-05-16: qty 2

## 2018-05-16 MED ORDER — FENTANYL CITRATE (PF) 100 MCG/2ML IJ SOLN
25.0000 ug | INTRAMUSCULAR | Status: DC | PRN
  Filled 2018-05-16: qty 1

## 2018-05-16 MED ORDER — BUPIVACAINE HCL (PF) 0.25 % IJ SOLN
INTRAMUSCULAR | Status: DC | PRN
  Administered 2018-05-16: 7 mL

## 2018-05-16 MED ORDER — ACETAMINOPHEN 500 MG PO TABS
ORAL_TABLET | ORAL | Status: AC
  Filled 2018-05-16: qty 2

## 2018-05-16 MED ORDER — LIDOCAINE 2% (20 MG/ML) 5 ML SYRINGE
INTRAMUSCULAR | Status: DC | PRN
  Administered 2018-05-16: 80 mg via INTRAVENOUS

## 2018-05-16 MED ORDER — DEXAMETHASONE SODIUM PHOSPHATE 10 MG/ML IJ SOLN
INTRAMUSCULAR | Status: AC
  Filled 2018-05-16: qty 1

## 2018-05-16 MED ORDER — PHENYLEPHRINE 40 MCG/ML (10ML) SYRINGE FOR IV PUSH (FOR BLOOD PRESSURE SUPPORT)
PREFILLED_SYRINGE | INTRAVENOUS | Status: AC
  Filled 2018-05-16: qty 10

## 2018-05-16 MED ORDER — OXYCODONE HCL 5 MG PO TABS
5.0000 mg | ORAL_TABLET | Freq: Once | ORAL | Status: AC | PRN
  Administered 2018-05-16: 5 mg via ORAL
  Filled 2018-05-16: qty 1

## 2018-05-16 MED ORDER — CEFAZOLIN SODIUM-DEXTROSE 2-4 GM/100ML-% IV SOLN
2.0000 g | INTRAVENOUS | Status: AC
  Administered 2018-05-16: 2 g via INTRAVENOUS
  Filled 2018-05-16: qty 100

## 2018-05-16 MED ORDER — LIDOCAINE 2% (20 MG/ML) 5 ML SYRINGE
INTRAMUSCULAR | Status: AC
  Filled 2018-05-16: qty 5

## 2018-05-16 MED ORDER — ACETAMINOPHEN 325 MG PO TABS
ORAL_TABLET | ORAL | Status: DC | PRN
  Administered 2018-05-16: 1000 mg via ORAL

## 2018-05-16 MED ORDER — FENTANYL CITRATE (PF) 100 MCG/2ML IJ SOLN
INTRAMUSCULAR | Status: AC
Start: 2018-05-16 — End: ?
  Filled 2018-05-16: qty 2

## 2018-05-16 MED ORDER — PHENYLEPHRINE 40 MCG/ML (10ML) SYRINGE FOR IV PUSH (FOR BLOOD PRESSURE SUPPORT)
PREFILLED_SYRINGE | INTRAVENOUS | Status: DC | PRN
  Administered 2018-05-16: 120 ug via INTRAVENOUS

## 2018-05-16 MED ORDER — ONDANSETRON HCL 4 MG/2ML IJ SOLN
INTRAMUSCULAR | Status: DC | PRN
  Administered 2018-05-16: 4 mg via INTRAVENOUS

## 2018-05-16 MED ORDER — CEPHALEXIN 500 MG PO CAPS: 500.0000 mg | ORAL_CAPSULE | Freq: Two times a day (BID) | ORAL | 0 refills | Status: DC

## 2018-05-16 MED ORDER — PROPOFOL 10 MG/ML IV BOLUS
INTRAVENOUS | Status: AC
  Filled 2018-05-16: qty 20

## 2018-05-16 MED ORDER — FENTANYL CITRATE (PF) 100 MCG/2ML IJ SOLN
INTRAMUSCULAR | Status: DC | PRN
  Administered 2018-05-16: 50 ug via INTRAVENOUS

## 2018-05-16 MED ORDER — TRAMADOL HCL 50 MG PO TABS: 50.0000 mg | ORAL_TABLET | Freq: Four times a day (QID) | ORAL | 0 refills | Status: DC | PRN

## 2018-05-16 MED ORDER — PROPOFOL 10 MG/ML IV BOLUS
INTRAVENOUS | Status: DC | PRN
  Administered 2018-05-16: 50 mg via INTRAVENOUS
  Administered 2018-05-16: 150 mg via INTRAVENOUS

## 2018-05-16 MED ORDER — OXYCODONE HCL 5 MG/5ML PO SOLN
5.0000 mg | Freq: Once | ORAL | Status: AC | PRN
  Filled 2018-05-16: qty 5

## 2018-05-16 SURGICAL SUPPLY — 51 items
BLADE CLIPPER SENSICLIP SURGIC (BLADE) IMPLANT
BLADE SURG 15 STRL LF DISP TIS (BLADE) ×1 IMPLANT
BLADE SURG 15 STRL SS (BLADE) ×3
BNDG GAUZE ELAST 4 BULKY (GAUZE/BANDAGES/DRESSINGS) ×3 IMPLANT
CANISTER SUCT 3000ML PPV (MISCELLANEOUS) ×2 IMPLANT
CANISTER SUCTION 1200CC (MISCELLANEOUS) IMPLANT
CLEANER CAUTERY TIP 5X5 PAD (MISCELLANEOUS) IMPLANT
COVER BACK TABLE 60X90IN (DRAPES) ×3 IMPLANT
COVER MAYO STAND STRL (DRAPES) ×3 IMPLANT
COVER WAND RF STERILE (DRAPES) ×4 IMPLANT
DISSECTOR ROUND CHERRY 3/8 STR (MISCELLANEOUS) IMPLANT
DRAIN PENROSE 18X1/4 LTX STRL (WOUND CARE) ×3 IMPLANT
DRAPE LAPAROTOMY 100X72 PEDS (DRAPES) ×3 IMPLANT
ELECT NDL TIP 2.8 STRL (NEEDLE) IMPLANT
ELECT NEEDLE TIP 2.8 STRL (NEEDLE) IMPLANT
ELECT REM PT RETURN 9FT ADLT (ELECTROSURGICAL) ×3
ELECTRODE REM PT RTRN 9FT ADLT (ELECTROSURGICAL) IMPLANT
GAUZE SPONGE 4X4 12PLY STRL (GAUZE/BANDAGES/DRESSINGS) ×2 IMPLANT
GLOVE BIO SURGEON STRL SZ8 (GLOVE) ×3 IMPLANT
GLOVE BIOGEL PI IND STRL 7.5 (GLOVE) IMPLANT
GLOVE BIOGEL PI IND STRL 8.5 (GLOVE) IMPLANT
GLOVE BIOGEL PI INDICATOR 7.5 (GLOVE) ×2
GLOVE BIOGEL PI INDICATOR 8.5 (GLOVE) ×4
GLOVE INDICATOR 8.5 STRL (GLOVE) ×2 IMPLANT
GOWN STRL REUS W/ TWL LRG LVL3 (GOWN DISPOSABLE) ×1 IMPLANT
GOWN STRL REUS W/ TWL XL LVL3 (GOWN DISPOSABLE) ×1 IMPLANT
GOWN STRL REUS W/TWL LRG LVL3 (GOWN DISPOSABLE) ×3
GOWN STRL REUS W/TWL XL LVL3 (GOWN DISPOSABLE) ×3
KIT TURNOVER CYSTO (KITS) ×3 IMPLANT
MANIFOLD NEPTUNE II (INSTRUMENTS) IMPLANT
NEEDLE HYPO 22GX1.5 SAFETY (NEEDLE) IMPLANT
NS IRRIG 500ML POUR BTL (IV SOLUTION) ×3 IMPLANT
PACK BASIN DAY SURGERY FS (CUSTOM PROCEDURE TRAY) ×3 IMPLANT
PAD CLEANER CAUTERY TIP 5X5 (MISCELLANEOUS)
PENCIL BUTTON HOLSTER BLD 10FT (ELECTRODE) ×3 IMPLANT
SUPPORT SCROTAL LG STRP (MISCELLANEOUS) ×2 IMPLANT
SUPPORT SCROTAL MED ADLT STRP (MISCELLANEOUS) ×1 IMPLANT
SUPPORTER ATHLETIC LG (MISCELLANEOUS) ×1
SUPPORTER ATHLETIC MED (MISCELLANEOUS) ×1
SUT CHROMIC 2 0 SH (SUTURE) ×2 IMPLANT
SUT CHROMIC 3 0 SH 27 (SUTURE) ×2 IMPLANT
SUT CHROMIC 4 0 SH 27 (SUTURE) IMPLANT
SUT MNCRL AB 4-0 PS2 18 (SUTURE) ×3 IMPLANT
SYR CONTROL 10ML LL (SYRINGE) IMPLANT
TOWEL NATURAL 6PK STERILE (DISPOSABLE) ×6 IMPLANT
TOWEL OR 17X26 10 PK STRL BLUE (TOWEL DISPOSABLE) ×4 IMPLANT
TRAY DSU PREP LF (CUSTOM PROCEDURE TRAY) ×3 IMPLANT
TUBE CONNECTING 12'X1/4 (SUCTIONS) ×1
TUBE CONNECTING 12X1/4 (SUCTIONS) ×2 IMPLANT
WATER STERILE IRR 500ML POUR (IV SOLUTION) ×3 IMPLANT
YANKAUER SUCT BULB TIP NO VENT (SUCTIONS) ×3 IMPLANT

## 2018-05-16 NOTE — Anesthesia Procedure Notes (Signed)
Procedure Name: LMA Insertion Date/Time: 05/16/2018 10:50 AM Performed by: Bonney Aid, CRNA Pre-anesthesia Checklist: Patient identified, Emergency Drugs available, Suction available and Patient being monitored Patient Re-evaluated:Patient Re-evaluated prior to induction Oxygen Delivery Method: Circle system utilized Preoxygenation: Pre-oxygenation with 100% oxygen Induction Type: IV induction Ventilation: Mask ventilation without difficulty LMA: LMA inserted LMA Size: 5.0 Number of attempts: 1 Airway Equipment and Method: Bite block Placement Confirmation: positive ETCO2 Tube secured with: Tape Dental Injury: Teeth and Oropharynx as per pre-operative assessment

## 2018-05-16 NOTE — Anesthesia Postprocedure Evaluation (Signed)
Anesthesia Post Note  Patient: [facility]  Procedure(s) Performed: HYDROCELECTOMY ADULT (Left Scrotum)     Patient location during evaluation: PACU Anesthesia Type: General Level of consciousness: awake and alert Pain management: pain level controlled Vital Signs Assessment: post-procedure vital signs reviewed and stable Respiratory status: spontaneous breathing, nonlabored ventilation and respiratory function stable Cardiovascular status: blood pressure returned to baseline and stable Postop Assessment: no apparent nausea or vomiting Anesthetic complications: no    Last Vitals:  Vitals:   05/16/18 1145 05/16/18 1158  BP: 126/88 (!) 119/96  Pulse: 67 63  Resp: 18 19  Temp:    SpO2: 99%     Last Pain:  Vitals:   05/16/18 1152  TempSrc:   PainSc: 4                  Lidia Collum

## 2018-05-16 NOTE — Anesthesia Preprocedure Evaluation (Signed)
Anesthesia Evaluation  Patient identified by MRN, date of birth, ID band Patient awake    Reviewed: Allergy & Precautions, NPO status , Patient's Chart, lab work & pertinent test results  History of Anesthesia Complications Negative for: history of anesthetic complications  Airway Mallampati: I  TM Distance: >3 FB Neck ROM: Full    Dental no notable dental hx. (+) Teeth Intact   Pulmonary neg pulmonary ROS, former smoker,    Pulmonary exam normal breath sounds clear to auscultation       Cardiovascular Normal cardiovascular exam+ dysrhythmias Atrial Fibrillation + pacemaker  Rhythm:Regular Rate:Normal     Neuro/Psych negative neurological ROS  negative psych ROS   GI/Hepatic Neg liver ROS, GERD  ,  Endo/Other  negative endocrine ROS  Renal/GU negative Renal ROS  negative genitourinary   Musculoskeletal negative musculoskeletal ROS (+)   Abdominal   Peds  Hematology negative hematology ROS (+)   Anesthesia Other Findings 73 yo M for hydrocelectomy - A fib on Eliquis, s/p pacemaker, GERD - recent flu with symptom resolution ~9 days ago  Reproductive/Obstetrics                            Anesthesia Physical Anesthesia Plan  ASA: III  Anesthesia Plan: General   Post-op Pain Management:    Induction: Intravenous  PONV Risk Score and Plan: 2 and Ondansetron, Dexamethasone and Treatment may vary due to age or medical condition  Airway Management Planned: LMA  Additional Equipment: None  Intra-op Plan:   Post-operative Plan: Extubation in OR  Informed Consent: I have reviewed the patients History and Physical, chart, labs and discussed the procedure including the risks, benefits and alternatives for the proposed anesthesia with the patient or authorized representative who has indicated his/her understanding and acceptance.     Dental advisory given  Plan Discussed with:    Anesthesia Plan Comments: (Explained slightly increased risk of respiratory complications with recent influenza, however he is totally asymptomatic at this time with clear lungs, and risk is likely still minimal. Plan for LMA which should minimize risk. He understands and accepts.)        Anesthesia Quick Evaluation

## 2018-05-16 NOTE — Transfer of Care (Signed)
Immediate Anesthesia Transfer of Care Note  Patient: [name]  Procedure(s) Performed: HYDROCELECTOMY ADULT (Left Scrotum)  Patient Location: PACU  Anesthesia Type:General  Level of Consciousness: drowsy  Airway & Oxygen Therapy: Patient Spontanous Breathing and Patient connected to nasal cannula oxygen  Post-op Assessment: Report given to RN  Post vital signs: Reviewed and stable  Last Vitals:  Vitals Value Taken Time  BP    Temp    Pulse 61 05/16/2018 11:18 AM  Resp    SpO2 99 % 05/16/2018 11:18 AM  Vitals shown include unvalidated device data.  Last Pain:  Vitals:   05/16/18 0855  TempSrc: Oral      Patients Stated Pain Goal: 5 (49/17/91 5056)  Complications: No apparent anesthesia complications

## 2018-05-16 NOTE — H&P (Signed)
H&P  Chief Complaint: Swollen scrotum  History of Present Illness: 73 year old male with large, symptomatic left hydrocele presents for hydrocelectomy.  Past Medical History:  Diagnosis Date  . Arthritis    "thumbs, back" (02/16/2017)  . Bradycardia 02/2017  . Dyspnea    with exertion  . Flu 2 weeks ago  . GERD (gastroesophageal reflux disease)   . Hepatitis A as child  . HLD (hyperlipidemia)   . Persistent atrial fibrillation 12/18/2016  . Pneumonia 1999; 2018  . 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 APPENDECTOMY     age 53  . CARDIOVERSION  02/16/2017  . CARDIOVERSION N/A 02/16/2017   Procedure: CARDIOVERSION;  Surgeon: Thompson Grayer, MD;  Location: [place] [facility];  Service: Cardiovascular;  Laterality: N/A;  . COLONOSCOPY WITH PROPOFOL N/A 12/13/2012   Procedure: COLONOSCOPY WITH PROPOFOL;  Surgeon: Garlan Fair, MD;  Location: [facility];  Service: Endoscopy;  Laterality: N/A;  . FRACTURE SURGERY    . INSERT / REPLACE / REMOVE PACEMAKER  02/16/2017  . 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 WRIST FRACTURE SURGERY Left    with pins    Home Medications:    Allergies:  Allergies  Allergen Reactions  . Atorvastatin Palpitations  . Ezetimibe Palpitations    Family History  Problem Relation Age of Onset  . CVA Mother   . Coronary artery disease Mother   . Stroke Mother   . Coronary artery disease Father   . Heart attack Father     Social History:  reports that he quit smoking about 52 years ago. His smoking use included cigarettes. He has a 0.96 pack-year smoking history. He has never used smokeless tobacco. He reports current alcohol use. He reports that he does not use drugs.  ROS: A complete review of systems was performed.  All systems are negative except for pertinent  findings as noted.  Physical Exam:  Vital signs in last 24 hours: Temp:  [97.3 F (36.3 C)] 97.3 F (36.3 C) (03/09 0855) Pulse Rate:  [65] 65 (03/09 0855) Resp:  [16] 16 (03/09 0855) BP: (117)/(91) 117/91 (03/09 0855) SpO2:  [100 %] 100 % (03/09 0855) Weight:  [93.4 kg] 93.4 kg (03/09 0855) Constitutional:  Alert and oriented, No acute distress Cardiovascular: Regular rate  Respiratory: Normal respiratory effort GI: Abdomen is soft, nontender, nondistended, no abdominal masses. No CVAT.  Genitourinary: Normal male phallus, large left hydrocele. Lymphatic: No lymphadenopathy Neurologic: Grossly intact, no focal deficits Psychiatric: Normal mood and affect  Laboratory Data:  No results for input(s): WBC, HGB, HCT, PLT in the last 72 hours.  No results for input(s): NA, K, CL, GLUCOSE, BUN, CALCIUM, CREATININE in the last 72 hours.  Invalid input(s): CO3   No results found for this or any previous visit (from the past 24 hour(s)). No results found for this or any previous visit (from the past 240 hour(s)).  Renal Function: No results for input(s): CREATININE in the last 168 hours. CrCl cannot be calculated (Patient's most recent lab result is older than the maximum 21 days allowed.).  Radiologic Imaging: No results found.  Impression/Assessment:  Left hydrocele  Plan:  Left hydroceectomy

## 2018-05-16 NOTE — Interval H&P Note (Signed)
History and Physical Interval Note:  05/16/2018 10:30 AM  [facility]  has presented today for surgery, with the diagnosis of LEFT HYDROCELE.  The various methods of treatment have been discussed with the patient and family. After consideration of risks, benefits and other options for treatment, the patient has consented to  Procedure(s) with comments: HYDROCELECTOMY ADULT (Left) - 45 MINS as a surgical intervention.  The patient's history has been reviewed, patient examined, no change in status, stable for surgery.  I have reviewed the patient's chart and labs.  Questions were answered to the patient's satisfaction.     Lillette Boxer Juanitta Earnhardt

## 2018-05-16 NOTE — Discharge Instructions (Signed)
Post Anesthesia Home Care Instructions  Activity: Get plenty of rest for the remainder of the day. A responsible individual must stay with you for 24 hours following the procedure.  For the next 24 hours, DO NOT: -Drive a car -Paediatric nurse -Drink alcoholic beverages -Take any medication unless instructed by your physician -Make any legal decisions or sign important papers.  Meals: Start with liquid foods such as gelatin or soup. Progress to regular foods as tolerated. Avoid greasy, spicy, heavy foods. If nausea and/or vomiting occur, drink only clear liquids until the nausea and/or vomiting subsides. Call your physician if vomiting continues.  Special Instructions/Symptoms: Your throat may feel dry or sore from the anesthesia or the breathing tube placed in your throat during surgery. If this causes discomfort, gargle with warm salt water. The discomfort should disappear within 24 hours.  Scrotal surgery postoperative instructions  Wound:  In most cases your incision will have absorbable sutures that will dissolve within the first 2-3 weeks. Some will fall out even earlier. Expect some redness as the sutures dissolve but this should occur only around the sutures. If there is generalized redness, especially with increasing pain or swelling, let us know. The scrotum will very likely get "black and blue" as the blood in the tissues spread. Sometimes the whole scrotum will turn colors. The black and blue is followed by a yellow and brown color. In time, all the discoloration will go away. In some cases some firm swelling in the area of the testicle may persist for up to 4-6 weeks after the surgery and is considered normal in most cases.  Cut small stitch and pull drain out Friday morning  Drain:  If the surgeon placed a drain through the bottom part of your scrotum, it is held in with a small stitch. When instructed, cut the small stitch and slide to drain out. Once the drain has been  removed, a small hole made drain out for another day or 2. If so, keep a clean washcloth underneath your supportive undergarment, or sterile gauze. Until the hole seals up, all bathing should be in the shower, and not in the bathtub.  Diet:  You may return to your normal diet within 24 hours following your surgery. You may note some mild nausea and possibly vomiting the first 6-8 hours following surgery. This is usually due to the side effects of anesthesia, and will disappear quite soon. I would suggest clear liquids and a very light meal the first evening following your surgery.  Activity:  Your physical activity should be restricted the first 48 hours. During that time you should remain relatively inactive, moving about only when necessary. During the first 7-10 days following surgery you should avoid lifting any heavy objects (anything greater than 15 pounds), and avoid strenuous exercise. If you work, ask [place] specifically about your restrictions, both for work and home. We will write a note to your employer if needed.  You should plan to wear a tight pair of jockey shorts or an athletic supporter for the first 4-5 days, even to sleep. This will keep the scrotum immobilized to some degree and keep the swelling down. You may find it more comfortable to wear a support longer.  Ice packs should be placed on and off over the scrotum for the first 48 hours. Frozen peas or corn in a ZipLock bag can be frozen, used and re-frozen. Fifteen minutes on and 15 minutes off is a reasonable schedule. The ice is a good  pain reliever and keeps the swelling down.  Hygiene:  You may shower 48 hours after your surgery. Tub bathing should be restricted until the seventh day.   Medication:  You will be sent home with some type of pain medication. In many cases you will be sent home with a narcotic pain pill (Vicodin or Tylox). If the pain is not too bad, you may take either Tylenol (acetaminophen) or Advil  (ibuprofen) which contain no narcotic agents, and might be tolerated a little better, with fewer side effects. If the pain medication you are sent home with does not control the pain, you will have to let us know. Some narcotic pain medications cannot be given or refilled by a phone call to a pharmacy.  OK to resume Eliquis Wednesday 05/18/2018.  Problems you should report to [place]:   Fever of 101.0 degrees Fahrenheit or greater.  Moderate or severe swelling under the skin incision or involving the scrotum.  Drug reaction such as hives, a rash, nausea or vomiting.

## 2018-05-16 NOTE — Op Note (Signed)
Preoperative diagnosis: Left hydrocele   Postoperative diagnosis: Same   Procedure:Left hydrocele repair   Surgeon: Lillette Boxer. Dawan Farney, M.D.   Anesthesia: Gen.   Indications: Patient presented with scrotal swelling. A hydrocele was confirmed with scrotal ultrasonography. The patient was symptomatic from his hydrocele and requested surgical intervention. He appeared to understand the risks benefits potential complications of this procedure.   Procedure: The patient was properly identified and marked in the holding room. He received preoperative IV antibiotics. He was then taken to the operating room where general anesthetic was administered using the LMA. The scrotum was then prepped and draped in the usual manner. Appropriate surgical timeout was performed. An incision was made in the median raphae of the scrotum. The hydrocele was encountered which was freed from the scrotal wall and then the testis and hydrocele sac were delivered from the incision. The hydrocele sac was then incised anteriorly and a large amount of fluid was obtained. The testis was carefully inspected and no other pathology was appreciated. The hydrocele sac was moderate in thickness.  The sac was then plicated posteriorly with a running 3-0 chromic suture. The spermatic cord block was then performed with quarter percent Marcaine. The testis was returned to the hemiscrotum taking great care to make sure that there was no twisting of the spermatic cord. Inspection of the inside of the scrotum revealed no significant bleeding. A quarter-inch Penrose was brought through the lower aspect of the scrotum into the affected hemiscrotum and sutured to the skin. The scrotal incision was then closed anatomically, first with a running 3-0 chromic reapproximating the dartos fascia, and then a 4-0 Monocryl used to close the skin in a simple running fashion. A fluff dressing and jockstrap was then placed Sponge and needle counts were correct. No  obvious complications occurred and the patient was brought to recovery room in stable condition.       male

## 2018-05-17 ENCOUNTER — Encounter (HOSPITAL_BASED_OUTPATIENT_CLINIC_OR_DEPARTMENT_OTHER): Payer: Self-pay | Admitting: Urology

## 2018-05-23 ENCOUNTER — Encounter: Payer: Medicare Other | Admitting: Internal Medicine

## 2018-06-23 ENCOUNTER — Telehealth: Payer: Self-pay

## 2018-06-23 ENCOUNTER — Telehealth: Payer: Self-pay | Admitting: Internal Medicine

## 2018-06-23 NOTE — Telephone Encounter (Signed)
New Message    Pt is calling calling and ask for April to call him back   Please call

## 2018-06-23 NOTE — Telephone Encounter (Signed)
New Message   Pt is calling about his appt with Dr Rayann Heman and he said if he doesn't have to come into the office he would like to do a virtual appt   Please call

## 2018-07-04 ENCOUNTER — Telehealth: Payer: Self-pay

## 2018-07-04 ENCOUNTER — Telehealth: Payer: Self-pay | Admitting: Internal Medicine

## 2018-07-04 NOTE — Telephone Encounter (Signed)
New Message    Pt is calling for April, he says he doesn't understand the my chart visit   Please call

## 2018-07-04 NOTE — Telephone Encounter (Signed)
Spoke with pt regarding appt on 07/06/18. Pt stated he is unable to check vitals. Pt vitals from most recent appt was entered. Pt questions and concerns were address.

## 2018-07-06 ENCOUNTER — Telehealth (INDEPENDENT_AMBULATORY_CARE_PROVIDER_SITE_OTHER): Payer: Medicare Other | Admitting: Internal Medicine

## 2018-07-06 DIAGNOSIS — K219 Gastro-esophageal reflux disease without esophagitis: Secondary | ICD-10-CM | POA: Diagnosis not present

## 2018-07-06 DIAGNOSIS — R001 Bradycardia, unspecified: Secondary | ICD-10-CM | POA: Diagnosis not present

## 2018-07-06 DIAGNOSIS — I4819 Other persistent atrial fibrillation: Secondary | ICD-10-CM | POA: Diagnosis not present

## 2018-07-06 DIAGNOSIS — Z86010 Personal history of colonic polyps: Secondary | ICD-10-CM | POA: Diagnosis not present

## 2018-07-06 DIAGNOSIS — I4891 Unspecified atrial fibrillation: Secondary | ICD-10-CM | POA: Diagnosis not present

## 2018-07-06 NOTE — Progress Notes (Signed)
Electrophysiology TeleHealth Note   Due to national recommendations of social distancing due to COVID 19, an audio/video telehealth visit is felt to be most appropriate for this patient at this time.  See MyChart message from today for the patient's consent to telehealth for [facility].  Date:  07/06/2018   ID:  Todd Chapman, DOB Jul 22, 1945, MRN [id number]  Location: patient's home  Provider location: [address], [place]  Evaluation Performed: Follow-up visit  PCP:  [name], MD  Cardiologist:    Dr Milus Height Electrophysiologist:  Dr Rayann Heman  Chief Complaint:  afib  History of Present Illness:    Todd Chapman is a 73 y.o. male who presents via audio/video conferencing for a telehealth visit today.  Since last being seen in our clinic, the patient reports doing very well.  Today, he denies symptoms of palpitations, chest pain, shortness of breath,  lower extremity edema, dizziness, presyncope, or syncope.  The patient is otherwise without complaint today.  The patient denies symptoms of fevers, chills, cough, or new SOB worrisome for COVID 19.  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    Past Surgical History:  Procedure Laterality Date   APPENDECTOMY     age 51   CARDIOVERSION  02/16/2017   CARDIOVERSION N/A 02/16/2017   Procedure: CARDIOVERSION;  Surgeon: Thompson Grayer, MD;  Location: [facility];  Service: Cardiovascular;  Laterality: N/A;   COLONOSCOPY WITH PROPOFOL N/A 12/13/2012   Procedure: COLONOSCOPY WITH PROPOFOL;  Surgeon: Garlan Fair, MD;  Location: [facility];  Service: Endoscopy;   Laterality: N/A;   FRACTURE SURGERY     HYDROCELE EXCISION Left 05/16/2018   Procedure: HYDROCELECTOMY ADULT;  Surgeon: [name], MD;  Location: [facility];  Service: Urology;  Laterality: Left;   INSERT / REPLACE / REMOVE PACEMAKER  02/16/2017   PACEMAKER IMPLANT N/A 02/16/2017   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     ELIQUIS 5 MG TABS tablet TAKE 1 TABLET(5 MG) BY MOUTH TWICE DAILY 180 tablet 1   loratadine (CLARITIN) 10 MG tablet Take 10 mg by mouth daily as needed for allergies.     Multiple Vitamin (MULTIVITAMIN WITH MINERALS) TABS tablet Take 1 tablet by mouth daily.     Multiple Vitamins-Minerals (EYE VITAMINS) CAPS Take 1 capsule by mouth daily.      mupirocin ointment (BACTROBAN) 2 % Place 1 application into the nose daily as needed (dry nose).     pantoprazole (PROTONIX) 40 MG tablet Take 40 mg daily by mouth.  2   rosuvastatin (CRESTOR) 10 MG tablet Take 10 mg by mouth every morning.     UNABLE TO FIND Vitamin d 3 2000 units daily     No current facility-administered medications for this visit.  Allergies:   Atorvastatin and Ezetimibe   Social History:  The patient  reports that he quit smoking about 52 years ago. His smoking use included cigarettes. He has a 0.96 pack-year smoking history. He has never used smokeless tobacco. He reports current alcohol use. He reports that he does not use drugs.   Family History:  The patient's  family history includes CVA in his mother; Coronary artery disease in his father and mother; Heart attack in his father; Stroke in his mother.   ROS:  Please see the history of present illness.   All other systems are personally  reviewed and negative.    Exam:    Vital Signs:  There were no vitals taken for this visit.  Well appearing, alert and conversant, regular work of breathing,  good skin color Eyes- anicteric, neuro- grossly intact, skin- no apparent rash or lesions or cyanosis, mouth- oral mucosa is pink   Labs/Other Tests and Data Reviewed:    Recent Labs: 05/16/2018: Hemoglobin 13.6; Potassium 4.3; Sodium 144   Wt Readings from Last 3 Encounters:  07/04/18 208 lb 9.6 oz (94.6 kg)  05/16/18 205 lb 12.8 oz (93.4 kg)  10/11/17 212 lb 12.8 oz (96.5 kg)     Other studies personally reviewed: Additional studies/ records that were reviewed today include:   My prior notes Review of the above records today demonstrates: as above    Last device remote is reviewed from [facility] PDF dated 04/18/2018 which reveals normal device function, afib burden is 100%   ASSESSMENT & PLAN:    1.  Longstanding persistent afib Rate controlled Asymptomatic On eliquis for chads2vasc score of 2  2. Symptomatic bradycardia Normal device function Remotes are uptodate  3. COVID 19 screen The patient denies symptoms of COVID 19 at this time.  The importance of social distancing was discussed today.  Follow-up:  12 months with me Next remote:07/2018  Current medicines are reviewed at length with the patient today.   The patient does not have concerns regarding his medicines.  The following changes were made today:  none  Labs/ tests ordered today include:  No orders of the defined types were placed in this encounter.  Patient Risk:  after full review of this patients clinical status, I feel that they are at moderate risk at this time.  Today, I have spent 15 minutes with the patient with telehealth technology discussing afib .    Army Fossa, MD  07/06/2018 9:34 AM     [facility] [address] [place] [address] [phone] (office) [phone] (fax)

## 2018-07-07 ENCOUNTER — Telehealth: Payer: Self-pay

## 2018-07-07 NOTE — Telephone Encounter (Signed)
Can you please comment on anticoagulation?

## 2018-07-07 NOTE — Telephone Encounter (Signed)
   [facility] Pre-operative Risk Assessment    Request for surgical clearance:  1. What type of surgery is being performed? Colonoscopy/Endoscopy   2. When is this surgery scheduled? June 2020   3. What type of clearance is required (medical clearance vs. Pharmacy clearance to hold med vs. Both)? Pharamcy  4. Are there any medications that need to be held prior to surgery and how long? Eliquis  X 2 days  5. Practice name and name of physician performing surgery? [facility] - Dr. Alessandra Bevels  6. What is your office phone number [phone]    7.   What is your office fax number [phone]  8.   Anesthesia type (None, local, MAC, general) ? Propofol   Jacinta Shoe 07/07/2018, 4:04 PM  _________________________________________________________________   (provider comments below)

## 2018-07-07 NOTE — Telephone Encounter (Signed)
Patient with diagnosis of atrial fibrillation on Eliquis for anticoagulation.    Procedure: colonoscopy/endoscopy Date of procedure: June 2020  CHADS2-VASc score of  2 , AGE, , AGE,)  CrCl 96.8 Platelet count 168 (from Dec 2018)  Per office protocol, patient can hold Eliquis for 2 days prior to procedure.

## 2018-07-08 NOTE — Telephone Encounter (Signed)
   Primary Cardiologist: Thompson Grayer, MD  Chart reviewed as part of pre-operative protocol coverage. Patient was contacted 07/08/2018 in reference to pre-operative risk assessment for pending surgery as outlined below.  Todd Chapman was last seen on 07/06/18 by Dr. Rayann Heman.   [facility] has asked for recommendations regarding eliquis, not medical clearance.  Per our pharmacy staff: Patient with diagnosis of atrial fibrillation on Eliquis for anticoagulation.    Procedure: colonoscopy/endoscopy Date of procedure: June 2020  CHADS2-VASc score of  2 , AGE, , AGE,)  CrCl 96.8 Platelet count 168 (from Dec 2018)  Per office protocol, patient can hold Eliquis for 2 days prior to procedure.   I will route this recommendation to the requesting party via Epic fax function and remove from pre-op pool.  Please call with questions.  Tami Lin Duke, PA 07/08/2018, 11:01 AM

## 2018-07-18 ENCOUNTER — Ambulatory Visit (INDEPENDENT_AMBULATORY_CARE_PROVIDER_SITE_OTHER): Payer: Medicare Other | Admitting: *Deleted

## 2018-07-18 ENCOUNTER — Other Ambulatory Visit: Payer: Self-pay

## 2018-07-18 ENCOUNTER — Telehealth: Payer: Self-pay

## 2018-07-18 DIAGNOSIS — I4819 Other persistent atrial fibrillation: Secondary | ICD-10-CM | POA: Diagnosis not present

## 2018-07-18 DIAGNOSIS — I495 Sick sinus syndrome: Secondary | ICD-10-CM

## 2018-07-18 NOTE — Telephone Encounter (Signed)
Left message for patient to remind of missed remote transmission.  

## 2018-07-19 ENCOUNTER — Other Ambulatory Visit: Payer: Self-pay | Admitting: Internal Medicine

## 2018-07-19 DIAGNOSIS — I4819 Other persistent atrial fibrillation: Secondary | ICD-10-CM

## 2018-07-19 LAB — CUP PACEART REMOTE DEVICE CHECK
Date Time Interrogation Session: 20200512114110
Implantable Lead Implant Date: 20181211
Implantable Lead Implant Date: 20181211
Implantable Lead Location: 753859
Implantable Lead Location: 753860
Implantable Pulse Generator Implant Date: 20181211
Pulse Gen Model: 2272
Pulse Gen Serial Number: 8967264

## 2018-07-20 NOTE — Telephone Encounter (Signed)
Pt last saw Dr Rayann Heman 07/06/18 video visit COVID-19, last labs 02/10/18 Creat 0.9 [facility], age 73, weight 96.5kg, based on specified criteria pt is on the appropriate dosage of Eliquis 5mg  BID.  Will refill rx.

## 2018-07-26 NOTE — Progress Notes (Signed)
Remote pacemaker transmission.   

## 2018-08-11 DIAGNOSIS — L110 Acquired keratosis follicularis: Secondary | ICD-10-CM | POA: Diagnosis not present

## 2018-08-11 DIAGNOSIS — D1801 Hemangioma of skin and subcutaneous tissue: Secondary | ICD-10-CM | POA: Diagnosis not present

## 2018-08-11 DIAGNOSIS — D2371 Other benign neoplasm of skin of right lower limb, including hip: Secondary | ICD-10-CM | POA: Diagnosis not present

## 2018-08-11 DIAGNOSIS — L570 Actinic keratosis: Secondary | ICD-10-CM | POA: Diagnosis not present

## 2018-08-11 DIAGNOSIS — L821 Other seborrheic keratosis: Secondary | ICD-10-CM | POA: Diagnosis not present

## 2018-08-11 DIAGNOSIS — D485 Neoplasm of uncertain behavior of skin: Secondary | ICD-10-CM | POA: Diagnosis not present

## 2018-08-11 DIAGNOSIS — Z85828 Personal history of other malignant neoplasm of skin: Secondary | ICD-10-CM | POA: Diagnosis not present

## 2018-09-15 DIAGNOSIS — K317 Polyp of stomach and duodenum: Secondary | ICD-10-CM | POA: Diagnosis not present

## 2018-09-15 DIAGNOSIS — D125 Benign neoplasm of sigmoid colon: Secondary | ICD-10-CM | POA: Diagnosis not present

## 2018-09-15 DIAGNOSIS — K2930 Chronic superficial gastritis without bleeding: Secondary | ICD-10-CM | POA: Diagnosis not present

## 2018-09-15 DIAGNOSIS — Z86010 Personal history of colonic polyps: Secondary | ICD-10-CM | POA: Diagnosis not present

## 2018-09-15 DIAGNOSIS — R12 Heartburn: Secondary | ICD-10-CM | POA: Diagnosis not present

## 2018-09-15 DIAGNOSIS — K5730 Diverticulosis of large intestine without perforation or abscess without bleeding: Secondary | ICD-10-CM | POA: Diagnosis not present

## 2018-09-15 DIAGNOSIS — K648 Other hemorrhoids: Secondary | ICD-10-CM | POA: Diagnosis not present

## 2018-09-15 DIAGNOSIS — D123 Benign neoplasm of transverse colon: Secondary | ICD-10-CM | POA: Diagnosis not present

## 2018-09-15 DIAGNOSIS — K621 Rectal polyp: Secondary | ICD-10-CM | POA: Diagnosis not present

## 2018-09-15 DIAGNOSIS — Z13810 Encounter for screening for upper gastrointestinal disorder: Secondary | ICD-10-CM | POA: Diagnosis not present

## 2018-09-20 DIAGNOSIS — D125 Benign neoplasm of sigmoid colon: Secondary | ICD-10-CM | POA: Diagnosis not present

## 2018-09-20 DIAGNOSIS — K621 Rectal polyp: Secondary | ICD-10-CM | POA: Diagnosis not present

## 2018-09-20 DIAGNOSIS — K317 Polyp of stomach and duodenum: Secondary | ICD-10-CM | POA: Diagnosis not present

## 2018-09-20 DIAGNOSIS — K2930 Chronic superficial gastritis without bleeding: Secondary | ICD-10-CM | POA: Diagnosis not present

## 2018-09-20 DIAGNOSIS — D123 Benign neoplasm of transverse colon: Secondary | ICD-10-CM | POA: Diagnosis not present

## 2018-10-11 NOTE — Progress Notes (Signed)
Cardiology Office Note Date:  10/12/2018  Patient ID:  Todd Chapman, DOB [birth year], MRN [id number] PCP:  [name], MD  Cardiologist:  Dr. Marlou Porch Electrophysiologist: Dr. Rayann Heman   Chief Complaint: elevated BP/headache  History of Present Illness: Todd Chapman is a 73 y.o. male with history of GERD, HLD, persistent Afib, symptomatic bradycardia w/PP<  He comes in today to be seen for Dr. Rayann Heman.  Last seen by him in Aug 2019, doing well.  He was in Afib, rate controlled, discussed Tikosyn for rhythm control though the patient preferred to continue with his current regime, feeling well.  Planned for a 6 mo follow up.  He is doing well.  Made the appointment because 3 weeks or so ago he had an unusual headache (no motor weakness, no other symptoms), fairly dull though persistent and that unusual for him.  He will occassionally have a headache in the Am that resolves fairly quickly after getting some water and move around.  This persisted even a day or so.  He called and was recommended to check his BP.  He bought a cuff, and repetitively got 180/130 or so.  He felt surely was a machine error and the headache went away.  He had been traveling for work, did eat unusually salty food with take out though this had never bothered him before.  He had no other symptoms.  His headache resolved and has remained resolved, he has not rechecked on his BP.  He denies any kind of CP, no real cardiac awareness at all, though feels he has probably had AFib for most of his adult life, never really feeling like he had great exertional capacity.    He has no near syncope or syncope, he is still working, likes to fish, feels he is pretty active, and does not at all feel like he is limited physically without SOB or DOE.  He is happy with his quality of life.  He denies nay bleeding or signs of bleeding.  COVID education/precautions were discussed with the patient today  Device information: SJM dual  chamber PPM, implanted 02/16/17   Past Medical History:  Diagnosis Date  . Arthritis    "thumbs, back" (02/16/2017)  . Bradycardia 02/2017  . Dyspnea    with exertion  . Flu 2 weeks ago  . GERD (gastroesophageal reflux disease)   . Hepatitis A as child  . HLD (hyperlipidemia)   . Persistent atrial fibrillation 12/18/2016  . Pneumonia 1999; 2018  . 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 APPENDECTOMY     age 53  . CARDIOVERSION  02/16/2017  . CARDIOVERSION N/A 02/16/2017   Procedure: CARDIOVERSION;  Surgeon: Thompson Grayer, MD;  Location: [facility];  Service: Cardiovascular;  Laterality: N/A;  . COLONOSCOPY WITH PROPOFOL N/A 12/13/2012   Procedure: COLONOSCOPY WITH PROPOFOL;  Surgeon: Garlan Fair, MD;  Location: [facility];  Service: Endoscopy;  Laterality: N/A;  . FRACTURE SURGERY    . HYDROCELE EXCISION Left 05/16/2018   Procedure: HYDROCELECTOMY ADULT;  Surgeon: [name], MD;  [facility];  Service: Urology;  Laterality: Left;  . INSERT / REPLACE / REMOVE PACEMAKER  02/16/2017  . 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 WRIST FRACTURE SURGERY Left    with pins    Current  Outpatient Medications  Medication Sig Dispense Refill  . acetaminophen (TYLENOL) 500 MG tablet Take 500 mg by mouth daily as needed for mild pain.    Marland Kitchen ELIQUIS 5 MG TABS tablet TAKE 1 TABLET(5 MG) BY MOUTH TWICE DAILY 180 tablet 1  . loratadine (CLARITIN) 10 MG tablet Take 10 mg by mouth daily as needed for allergies.    . Multiple Vitamin (MULTIVITAMIN WITH MINERALS) TABS tablet Take 1 tablet by mouth daily.    . Multiple Vitamins-Minerals (EYE VITAMINS) CAPS Take 1 capsule by mouth daily.     . mupirocin ointment (BACTROBAN) 2 % Place 1 application into the nose daily as needed  (dry nose).    . pantoprazole (PROTONIX) 40 MG tablet Take 40 mg daily by mouth.  2  . rosuvastatin (CRESTOR) 10 MG tablet Take 10 mg by mouth every morning.     No current facility-administered medications for this visit.     Allergies:   Atorvastatin and Ezetimibe   Social History:  The patient  reports that he quit smoking about 52 years ago. His smoking use included cigarettes. He has a 0.96 pack-year smoking history. He has never used smokeless tobacco. He reports current alcohol use. He reports that he does not use drugs.   Family History:  The patient's family history includes CVA in his mother; Coronary artery disease in his father and mother; Heart attack in his father; Stroke in his mother.  ROS:  Please see the history of present illness.    All other systems are reviewed and otherwise negative.   PHYSICAL EXAM:  VS:  BP 134/82   Pulse 60   Ht 6\' 2"  (1.88 m)   Wt 209 lb (94.8 kg)   BMI 26.83 kg/m  BMI: Body mass index is 26.83 kg/m. Well nourished, well developed, in no acute distress  HEENT: normocephalic, atraumatic  Neck: no JVD, carotid bruits or masses Cardiac:  RRR; (paced)no significant murmurs, no rubs, or gallops Lungs:  CTA b/l, no wheezing, rhonchi or rales  Abd: soft, nontender MS: no deformity or atrophy Ext:  no edema  Skin: warm and dry, no rash Neuro:  No gross deficits appreciated Psych: euthymic mood, full affect  PPM site is stable, no tethering or discomfort   EKG:  Done today and reviewed by myself shows V paced with some intrinic beats as well, 60bpm  PPM interrogation done today and reviewed by myself:  Battery and lead measurements are good, he is 100% AMS, underlying V rate 50's, 46% VP One HVR, is irregular, 42seconds   01/27/17: TTE Study Conclusions - Left ventricle: The cavity size was normal. Wall thickness was   normal. Systolic function was vigorous. The estimated ejection   fraction was in the range of 65% to 70%. Wall  motion was normal;   there were no regional wall motion abnormalities. The study is   not technically sufficient to allow evaluation of LV diastolic   function. - Aortic valve: Trileaflet. Sclerosis without stenosis. There was   no regurgitation. - Left atrium: Moderately dilated. - Right atrium: Moderately dilated. - Tricuspid valve: There was mild regurgitation. - Pulmonary arteries: PA peak pressure: 41 mm Hg (S)+ RAP. - Pericardium, extracardiac: There was no pericardial effusion.  Impressions: - Compared to a prior study in 2016, there are few changes. The   RVSP is higher at 41 mmHg + RAP and there is now moderate   biatrial enlargement.    Recent Labs: 05/16/2018: Hemoglobin 13.6; Potassium 4.3;  Sodium 144  No results found for requested labs within last 8760 hours.   CrCl cannot be calculated (Patient's most recent lab result is older than the maximum 21 days allowed.).   Wt Readings from Last 3 Encounters:  10/12/18 209 lb (94.8 kg)  07/04/18 208 lb 9.6 oz (94.6 kg)  05/16/18 205 lb 12.8 oz (93.4 kg)     Other studies reviewed: Additional studies/records reviewed today include: summarized above  ASSESSMENT AND PLAN:  1. Persistent AFib      CHA2DS2Vasc is 2, on Eliquis,  appropriately dosed by last labs, age/weight     He remains happy with his quality of life, unaware of his AFib and not inclined to pursue rhythm control strategy     He has annual labs with his PMD, we will request his labs from his PMD     H/H was stable in march  2. PPM     Intact function, no changes made  3. Headache associated with unusually high BP     Not known to have HTN, BP today is OK     No recurrent HA  He is asked to keep daily BP log and some into the BP clinic for follow up Otherwise will continue Q 15mo remote checks and see him back in 12mo, sooner if needed    Disposition: as above Current medicines are reviewed at length with the patient today.  The patient did not  have any concerns regarding medicines.   Venetia Night, PA-C 10/12/2018 3:20 PM     [place]  [address] [phone] (office)  [phone] (fax)

## 2018-10-12 ENCOUNTER — Encounter: Payer: Self-pay | Admitting: Physician Assistant

## 2018-10-12 ENCOUNTER — Encounter: Payer: Self-pay | Admitting: Internal Medicine

## 2018-10-12 ENCOUNTER — Other Ambulatory Visit: Payer: Self-pay

## 2018-10-12 ENCOUNTER — Ambulatory Visit (INDEPENDENT_AMBULATORY_CARE_PROVIDER_SITE_OTHER): Payer: Medicare Other | Admitting: Physician Assistant

## 2018-10-12 VITALS — BP 134/82 | HR 60 | Ht 74.0 in | Wt 209.0 lb

## 2018-10-12 DIAGNOSIS — I495 Sick sinus syndrome: Secondary | ICD-10-CM | POA: Diagnosis not present

## 2018-10-12 DIAGNOSIS — Z950 Presence of cardiac pacemaker: Secondary | ICD-10-CM

## 2018-10-12 DIAGNOSIS — I10 Essential (primary) hypertension: Secondary | ICD-10-CM | POA: Diagnosis not present

## 2018-10-12 DIAGNOSIS — R001 Bradycardia, unspecified: Secondary | ICD-10-CM | POA: Diagnosis not present

## 2018-10-12 NOTE — Patient Instructions (Addendum)
Medication Instructions:  Your physician recommends that you continue on your current medications as directed. Please refer to the Current Medication list given to you today.  If you need a refill on your cardiac medications before your next appointment, please call your pharmacy.   Lab work: NONE ORDERED  TODAY   If you have labs (blood work) drawn today and your tests are completely normal, you will receive your results only by: Marland Kitchen MyChart Message (if you have MyChart) OR . A paper copy in the mail If you have any lab test that is abnormal or we need to change your treatment, we will call you to review the results.  Testing/Procedures: NONE ORDERED  TODAY   Follow-Up: in 3 to 4 weeks in Blood pressure Clinic   At [facility], you and your health needs are our priority.  As part of our continuing mission to provide you with exceptional heart care, we have created designated Provider Care Teams.  These Care Teams include your primary Cardiologist (physician) and Advanced Practice Providers (APPs -  Physician Assistants and Nurse Practitioners) who all work together to provide you with the care you need, when you need it. You will need a follow up appointment in 6 months.  Please call our office 2 months in advance to schedule this appointment.  You may see Dr. Rayann Heman  or one of the following Advanced Practice Providers on your designated Care Team:   Chanetta Marshall, NP . Tommye Standard, PA-C  Remote monitoring is used to monitor your Pacemaker of ICD from home. This monitoring reduces the number of office visits required to check your device to one time per year. It allows [place] to keep an eye on the functioning of your device to ensure it is working properly. You are scheduled for a device check from home on .  8-10-20You may send your transmission at any time that day. If you have a wireless device, the transmission will be sent automatically. After your physician reviews your transmission, you  will receive a postcard with your next transmission date.    Any Other Special Instructions Will Be Listed Below (If Applicable).

## 2018-10-17 ENCOUNTER — Encounter: Payer: Medicare Other | Admitting: *Deleted

## 2018-11-02 ENCOUNTER — Ambulatory Visit (INDEPENDENT_AMBULATORY_CARE_PROVIDER_SITE_OTHER): Payer: Medicare Other | Admitting: *Deleted

## 2018-11-02 DIAGNOSIS — I495 Sick sinus syndrome: Secondary | ICD-10-CM | POA: Diagnosis not present

## 2018-11-02 LAB — CUP PACEART REMOTE DEVICE CHECK
Battery Remaining Longevity: 113 mo
Battery Remaining Percentage: 95.5 %
Battery Voltage: 2.99 V
Brady Statistic AP VP Percent: 0 %
Brady Statistic AP VS Percent: 0 %
Brady Statistic AS VP Percent: 0 %
Brady Statistic AS VS Percent: 0 %
Brady Statistic RA Percent Paced: 1 %
Brady Statistic RV Percent Paced: 70 %
Date Time Interrogation Session: 20200826035949
Implantable Lead Implant Date: 20181211
Implantable Lead Implant Date: 20181211
Implantable Lead Location: 753859
Implantable Lead Location: 753860
Implantable Pulse Generator Implant Date: 20181211
Lead Channel Impedance Value: 450 Ohm
Lead Channel Impedance Value: 480 Ohm
Lead Channel Pacing Threshold Amplitude: 0.625 V
Lead Channel Pacing Threshold Amplitude: 0.75 V
Lead Channel Pacing Threshold Pulse Width: 0.5 ms
Lead Channel Pacing Threshold Pulse Width: 0.5 ms
Lead Channel Sensing Intrinsic Amplitude: 12 mV
Lead Channel Sensing Intrinsic Amplitude: 5 mV
Lead Channel Setting Pacing Amplitude: 1.625
Lead Channel Setting Pacing Amplitude: 2.5 V
Lead Channel Setting Pacing Pulse Width: 0.5 ms
Lead Channel Setting Sensing Sensitivity: 2 mV
Pulse Gen Model: 2272
Pulse Gen Serial Number: 8967264

## 2018-11-07 NOTE — Patient Instructions (Addendum)
It was a pleasure seeing you in clinic today Todd Chapman!  Today the plan is... 1. Buy a blood pressure machine  2. Come back to PharmD clinic to get blood pressure machine compared to manual blood pressure cuff. Call PharmD clinic to schedule appt for blood pressure machine check.  3. Check blood pressure readings daily for 1 week then call PharmD clinic to tell pharmacist your blood pressure readings  Please call the PharmD clinic at [phone] if you have any questions that you would like to speak with a pharmacist about [name], [name], [name]).

## 2018-11-07 NOTE — Progress Notes (Signed)
Patient ID: Todd Chapman                 DOB: 04-Nov-1945                      MRN: [id number]     HPI: Todd Chapman is a 73 y.o. male patient of Dr. Marlou Porch who was referred by Tommye Standard to HTN clinic. PMH is significant for GERD, HLD, persistent Afib, and symptomatic bradycardia. He has a SJM dual chamber PPM (implanted 02/16/17). He was referred after his appt with Tommye Standard due to experiencing home BP readings that were repetitively ~180/130 mmHg in addition to a headache. Pt described headache as dull and unusual. He did not experience any motor weakness or other symptoms. The headache persisted >1 day and he had been eating a significant increase in salty food that day. His headache resolved and has remained resolved. He has not rechecked his BP since.  Pt presents today for initial PharmD appt for HTN management. He does not take NSAIDs, has not experienced a significant increase in stress, and does not consume salty foods. Pt is concerned using an automated blood pressure machine due to previously receiving a malfunctioning one in the past.  Current HTN meds: None Previously tried: None BP goal: <130/80 mmHg  Family History:  mother (CVA, CAD, stroke); father (CAD, MI)  Social History:  The patient  reports that he quit smoking about 52 years ago. His smoking use included cigarettes. He has a 0.96 pack-year smoking history. He has never used smokeless tobacco. He reports current alcohol use. He reports that he does not use drugs.   Diet:  -Breakfast: egg whites, ezekial bread, oatmeal with cinnamon/apples/walnuts, hot tea, breakfast drink (spinach, banana, dates)  -Lunch: Turkey/cheese roll up, corn chips, apples -Dinner: cauliflower rice, ground chicken sausage, angel hair pasta, steamed broccoli, chocolate covered almonds  Exercise: yoga/stretching (1-1.5 hrs), walks 2-3 miles/day, weights, stand up desk, golf  Home BP readings: None  Wt Readings from Last 3 Encounters:   10/12/18 209 lb (94.8 kg)  07/04/18 208 lb 9.6 oz (94.6 kg)  05/16/18 205 lb 12.8 oz (93.4 kg)   BP Readings from Last 3 Encounters:  10/12/18 134/82  07/04/18 130/70  05/16/18 (!) 143/88   Pulse Readings from Last 3 Encounters:  10/12/18 60  07/04/18 74  05/16/18 (!) 58    Renal function: CrCl cannot be calculated (Patient's most recent lab result is older than the maximum 21 days allowed.).  Past Medical History:  Diagnosis Date  . Arthritis    "thumbs, back" (02/16/2017)  . Bradycardia 02/2017  . Dyspnea    with exertion  . Flu 2 weeks ago  . GERD (gastroesophageal reflux disease)   . Hepatitis A as child  . HLD (hyperlipidemia)   . Persistent atrial fibrillation 12/18/2016  . Pneumonia 1999; 2018  . Presence of permanent cardiac pacemaker    st jude    Current Outpatient Medications on File Prior to Visit  Medication Sig Dispense Refill  . acetaminophen (TYLENOL) 500 MG tablet Take 500 mg by mouth daily as needed for mild pain.    Marland Kitchen ELIQUIS 5 MG TABS tablet TAKE 1 TABLET(5 MG) BY MOUTH TWICE DAILY 180 tablet 1  . loratadine (CLARITIN) 10 MG tablet Take 10 mg by mouth daily as needed for allergies.    . Multiple Vitamin (MULTIVITAMIN WITH MINERALS) TABS tablet Take 1 tablet by mouth daily.    . Multiple  Vitamins-Minerals (EYE VITAMINS) CAPS Take 1 capsule by mouth daily.     . mupirocin ointment (BACTROBAN) 2 % Place 1 application into the nose daily as needed (dry nose).    . pantoprazole (PROTONIX) 40 MG tablet Take 40 mg daily by mouth.  2  . rosuvastatin (CRESTOR) 10 MG tablet Take 10 mg by mouth every morning.     No current facility-administered medications on file prior to visit.     Allergies  Allergen Reactions  . Atorvastatin Palpitations  . Ezetimibe Palpitations     Assessment/Plan:  1. Hypertension - goal < 130/80 mmHg. While pt has experienced prior elevated blood pressure readings at past office visits, pt's BP reading at today's visit was  at goal. Plan for pt to purchase a blood pressure machine then schedule an appt with PharmD clinic for blood pressure machine calibration. Afterwards, plan for pt to record BP readings daily for 1 week then call PharmD clinic to report readings to pharmacist. Pharmacist can determine if HTN pharmacotherapy is needed at that time. Pt verbalized understanding.   Thank you for involving pharmacy to assist in providing Mr. Revill care.   Drexel Iha, PharmD PGY2 Ambulatory Care Pharmacy Resident

## 2018-11-08 ENCOUNTER — Other Ambulatory Visit: Payer: Self-pay

## 2018-11-08 ENCOUNTER — Ambulatory Visit (INDEPENDENT_AMBULATORY_CARE_PROVIDER_SITE_OTHER): Payer: Medicare Other | Admitting: Pharmacist

## 2018-11-08 DIAGNOSIS — R030 Elevated blood-pressure reading, without diagnosis of hypertension: Secondary | ICD-10-CM

## 2018-11-10 ENCOUNTER — Encounter: Payer: Self-pay | Admitting: Cardiology

## 2018-11-10 NOTE — Progress Notes (Signed)
Remote pacemaker transmission.   

## 2018-12-06 ENCOUNTER — Telehealth: Payer: Self-pay | Admitting: Pharmacist

## 2018-12-06 NOTE — Telephone Encounter (Signed)
Called pt on 12/06/18 at 12:54 PM.  Patient's BP was noted to be controlled at last appt with HTN clinic (11/08/18) without medications; however, followed up with pt based on how pt stated at last appt he would buy a blood pressure machine to monitor BP readings occasionally  and was interested in checking accuracy of that machine. Pt states he has not bought a machine since we last spoke, however, is "feeling great". Pt is not interested in buying a blood pressure machine and monitoring his BP at this time. Discussed with patient that his BP is controlled so frequent monitoring is not necessary, however, if he notices uncontrolled BP readings in the future to follow up with HTN clinic. Provided pt PharmD telephone number for HTN clinic. Patient verbalized understanding.

## 2018-12-08 DIAGNOSIS — Z23 Encounter for immunization: Secondary | ICD-10-CM | POA: Diagnosis not present

## 2019-01-02 DIAGNOSIS — H25813 Combined forms of age-related cataract, bilateral: Secondary | ICD-10-CM | POA: Diagnosis not present

## 2019-01-02 DIAGNOSIS — H02831 Dermatochalasis of right upper eyelid: Secondary | ICD-10-CM | POA: Diagnosis not present

## 2019-01-02 DIAGNOSIS — H02834 Dermatochalasis of left upper eyelid: Secondary | ICD-10-CM | POA: Diagnosis not present

## 2019-01-02 DIAGNOSIS — H35363 Drusen (degenerative) of macula, bilateral: Secondary | ICD-10-CM | POA: Diagnosis not present

## 2019-01-02 DIAGNOSIS — D3132 Benign neoplasm of left choroid: Secondary | ICD-10-CM | POA: Diagnosis not present

## 2019-02-01 ENCOUNTER — Ambulatory Visit (INDEPENDENT_AMBULATORY_CARE_PROVIDER_SITE_OTHER): Payer: Medicare Other | Admitting: *Deleted

## 2019-02-01 DIAGNOSIS — I495 Sick sinus syndrome: Secondary | ICD-10-CM

## 2019-02-03 LAB — CUP PACEART REMOTE DEVICE CHECK
Battery Remaining Longevity: 113 mo
Battery Remaining Percentage: 95.5 %
Battery Voltage: 2.99 V
Brady Statistic AP VP Percent: 0 %
Brady Statistic AP VS Percent: 0 %
Brady Statistic AS VP Percent: 0 %
Brady Statistic AS VS Percent: 0 %
Brady Statistic RA Percent Paced: 1 %
Brady Statistic RV Percent Paced: 67 %
Date Time Interrogation Session: 20201127102957
Implantable Lead Implant Date: 20181211
Implantable Lead Implant Date: 20181211
Implantable Lead Location: 753859
Implantable Lead Location: 753860
Implantable Pulse Generator Implant Date: 20181211
Lead Channel Impedance Value: 430 Ohm
Lead Channel Impedance Value: 440 Ohm
Lead Channel Pacing Threshold Amplitude: 0.625 V
Lead Channel Pacing Threshold Amplitude: 0.75 V
Lead Channel Pacing Threshold Pulse Width: 0.5 ms
Lead Channel Pacing Threshold Pulse Width: 0.5 ms
Lead Channel Sensing Intrinsic Amplitude: 5 mV
Lead Channel Sensing Intrinsic Amplitude: 9.2 mV
Lead Channel Setting Pacing Amplitude: 1.625
Lead Channel Setting Pacing Amplitude: 2.5 V
Lead Channel Setting Pacing Pulse Width: 0.5 ms
Lead Channel Setting Sensing Sensitivity: 2 mV
Pulse Gen Model: 2272
Pulse Gen Serial Number: 8967264

## 2019-02-20 DIAGNOSIS — L570 Actinic keratosis: Secondary | ICD-10-CM | POA: Diagnosis not present

## 2019-02-20 DIAGNOSIS — L820 Inflamed seborrheic keratosis: Secondary | ICD-10-CM | POA: Diagnosis not present

## 2019-02-20 DIAGNOSIS — D485 Neoplasm of uncertain behavior of skin: Secondary | ICD-10-CM | POA: Diagnosis not present

## 2019-02-23 DIAGNOSIS — D6869 Other thrombophilia: Secondary | ICD-10-CM | POA: Diagnosis not present

## 2019-02-23 DIAGNOSIS — Z950 Presence of cardiac pacemaker: Secondary | ICD-10-CM | POA: Diagnosis not present

## 2019-02-23 DIAGNOSIS — M461 Sacroiliitis, not elsewhere classified: Secondary | ICD-10-CM | POA: Diagnosis not present

## 2019-02-23 DIAGNOSIS — M25561 Pain in right knee: Secondary | ICD-10-CM | POA: Diagnosis not present

## 2019-02-23 DIAGNOSIS — K621 Rectal polyp: Secondary | ICD-10-CM | POA: Diagnosis not present

## 2019-02-23 DIAGNOSIS — E559 Vitamin D deficiency, unspecified: Secondary | ICD-10-CM | POA: Diagnosis not present

## 2019-02-23 DIAGNOSIS — Z79899 Other long term (current) drug therapy: Secondary | ICD-10-CM | POA: Diagnosis not present

## 2019-02-23 DIAGNOSIS — Z125 Encounter for screening for malignant neoplasm of prostate: Secondary | ICD-10-CM | POA: Diagnosis not present

## 2019-02-23 DIAGNOSIS — R972 Elevated prostate specific antigen [PSA]: Secondary | ICD-10-CM | POA: Diagnosis not present

## 2019-02-23 DIAGNOSIS — K219 Gastro-esophageal reflux disease without esophagitis: Secondary | ICD-10-CM | POA: Diagnosis not present

## 2019-02-23 DIAGNOSIS — D123 Benign neoplasm of transverse colon: Secondary | ICD-10-CM | POA: Diagnosis not present

## 2019-02-23 DIAGNOSIS — I4891 Unspecified atrial fibrillation: Secondary | ICD-10-CM | POA: Diagnosis not present

## 2019-03-05 ENCOUNTER — Other Ambulatory Visit: Payer: Self-pay | Admitting: Internal Medicine

## 2019-03-05 DIAGNOSIS — I4819 Other persistent atrial fibrillation: Secondary | ICD-10-CM

## 2019-03-23 ENCOUNTER — Telehealth: Payer: Self-pay | Admitting: Internal Medicine

## 2019-03-23 NOTE — Telephone Encounter (Signed)
We are recommending the COVID-19 vaccine to all of our patients. Cardiac medications (including blood thinners) should not deter anyone from being vaccinated and there is no need to hold any of those medications prior to vaccine administration.     Currently, there is a hotline to call (active 03/17/19) to schedule vaccination appointments as no walk-ins will be accepted.   Number: [phone]    If you have further questions or concerns about the vaccine process, please visit [email or web address] or contact your primary care physician.  Patient is calling stating he cannot get in contact with anyone with the number we give to schedule vaccine appointments. He is requesting Dr. Rayann Heman or a nurse assist him in getting an appointment.

## 2019-03-26 ENCOUNTER — Other Ambulatory Visit: Payer: Self-pay | Admitting: Internal Medicine

## 2019-03-28 ENCOUNTER — Ambulatory Visit: Payer: Medicare Other | Admitting: Orthopaedic Surgery

## 2019-03-28 DIAGNOSIS — R972 Elevated prostate specific antigen [PSA]: Secondary | ICD-10-CM | POA: Diagnosis not present

## 2019-04-09 ENCOUNTER — Ambulatory Visit: Payer: Medicare Other

## 2019-04-13 ENCOUNTER — Ambulatory Visit: Payer: Medicare Other | Attending: Internal Medicine

## 2019-04-13 DIAGNOSIS — Z23 Encounter for immunization: Secondary | ICD-10-CM | POA: Insufficient documentation

## 2019-04-13 NOTE — Progress Notes (Signed)
   Covid-19 Vaccination Clinic  Name:  Todd Chapman    MRN: [id number] DOB: Apr 01, 1945  04/13/2019  Todd Chapman was observed post Covid-19 immunization for 15 minutes without incidence. He was provided with Vaccine Information Sheet and instruction to access the V-Safe system.   Todd Chapman was instructed to call 911 with any severe reactions post vaccine: Marland Kitchen Difficulty breathing  . Swelling of your face and throat  . A fast heartbeat  . A bad rash all over your body  . Dizziness and weakness    Immunizations Administered    Name Date Dose VIS Date Route   Pfizer COVID-19 Vaccine 04/13/2019  1:51 PM 0.3 mL 02/17/2019 Intramuscular   Manufacturer: [place]   Lot: [id number]   [place]: [id number]

## 2019-04-20 ENCOUNTER — Ambulatory Visit: Payer: Medicare Other

## 2019-05-08 ENCOUNTER — Ambulatory Visit: Payer: Medicare Other | Attending: Internal Medicine

## 2019-05-08 DIAGNOSIS — Z23 Encounter for immunization: Secondary | ICD-10-CM

## 2019-05-08 NOTE — Progress Notes (Signed)
   Covid-19 Vaccination Clinic  Name:  Todd Chapman    MRN: [id number] DOB: 07/03/1945  05/08/2019  [name] was observed post Covid-19 immunization for 15 minutes without incidence. He was provided with Vaccine Information Sheet and instruction to access the V-Safe system.   [name] was instructed to call 911 with any severe reactions post vaccine: Marland Kitchen Difficulty breathing  . Swelling of your face and throat  . A fast heartbeat  . A bad rash all over your body  . Dizziness and weakness    Immunizations Administered    Name Date Dose VIS Date Route   Pfizer COVID-19 Vaccine 05/08/2019  3:59 PM 0.3 mL 02/17/2019 Intramuscular   Manufacturer: [place]   Lot: [id number]   [place]: [id number]

## 2019-05-12 ENCOUNTER — Ambulatory Visit (INDEPENDENT_AMBULATORY_CARE_PROVIDER_SITE_OTHER): Payer: Medicare Other | Admitting: *Deleted

## 2019-05-12 DIAGNOSIS — I495 Sick sinus syndrome: Secondary | ICD-10-CM | POA: Diagnosis not present

## 2019-05-12 LAB — CUP PACEART REMOTE DEVICE CHECK
Battery Remaining Longevity: 115 mo
Battery Remaining Percentage: 95.5 %
Battery Voltage: 2.99 V
Brady Statistic AP VP Percent: 0 %
Brady Statistic AP VS Percent: 0 %
Brady Statistic AS VP Percent: 0 %
Brady Statistic AS VS Percent: 0 %
Brady Statistic RA Percent Paced: 1 %
Brady Statistic RV Percent Paced: 66 %
Date Time Interrogation Session: 20210304203059
Implantable Lead Implant Date: 20181211
Implantable Lead Implant Date: 20181211
Implantable Lead Location: 753859
Implantable Lead Location: 753860
Implantable Pulse Generator Implant Date: 20181211
Lead Channel Impedance Value: 450 Ohm
Lead Channel Impedance Value: 490 Ohm
Lead Channel Pacing Threshold Amplitude: 0.625 V
Lead Channel Pacing Threshold Amplitude: 0.75 V
Lead Channel Pacing Threshold Pulse Width: 0.5 ms
Lead Channel Pacing Threshold Pulse Width: 0.5 ms
Lead Channel Sensing Intrinsic Amplitude: 12 mV
Lead Channel Sensing Intrinsic Amplitude: 5 mV
Lead Channel Setting Pacing Amplitude: 1.625
Lead Channel Setting Pacing Amplitude: 2.5 V
Lead Channel Setting Pacing Pulse Width: 0.5 ms
Lead Channel Setting Sensing Sensitivity: 2 mV
Pulse Gen Model: 2272
Pulse Gen Serial Number: 8967264

## 2019-05-13 NOTE — Progress Notes (Signed)
PPM remote 

## 2019-07-12 DIAGNOSIS — H25813 Combined forms of age-related cataract, bilateral: Secondary | ICD-10-CM | POA: Diagnosis not present

## 2019-07-12 DIAGNOSIS — D3132 Benign neoplasm of left choroid: Secondary | ICD-10-CM | POA: Diagnosis not present

## 2019-07-12 DIAGNOSIS — H2513 Age-related nuclear cataract, bilateral: Secondary | ICD-10-CM | POA: Diagnosis not present

## 2019-07-12 DIAGNOSIS — H35363 Drusen (degenerative) of macula, bilateral: Secondary | ICD-10-CM | POA: Diagnosis not present

## 2019-07-13 DIAGNOSIS — H25813 Combined forms of age-related cataract, bilateral: Secondary | ICD-10-CM | POA: Insufficient documentation

## 2019-07-17 ENCOUNTER — Encounter: Payer: Medicare Other | Admitting: Internal Medicine

## 2019-07-19 DIAGNOSIS — R972 Elevated prostate specific antigen [PSA]: Secondary | ICD-10-CM | POA: Diagnosis not present

## 2019-07-19 DIAGNOSIS — N430 Encysted hydrocele: Secondary | ICD-10-CM | POA: Diagnosis not present

## 2019-07-31 ENCOUNTER — Telehealth (INDEPENDENT_AMBULATORY_CARE_PROVIDER_SITE_OTHER): Payer: Medicare Other | Admitting: Internal Medicine

## 2019-07-31 ENCOUNTER — Other Ambulatory Visit: Payer: Self-pay

## 2019-07-31 ENCOUNTER — Encounter: Payer: Self-pay | Admitting: Internal Medicine

## 2019-07-31 DIAGNOSIS — I4821 Permanent atrial fibrillation: Secondary | ICD-10-CM | POA: Diagnosis not present

## 2019-07-31 DIAGNOSIS — R001 Bradycardia, unspecified: Secondary | ICD-10-CM

## 2019-07-31 NOTE — Progress Notes (Signed)
Electrophysiology TeleHealth Note   Due to national recommendations of social distancing due to COVID 19, an audio/video telehealth visit is felt to be most appropriate for this patient at this time.  See MyChart message from today for the patient's consent to telehealth for [facility].  Date:  07/31/2019   ID:  Todd Chapman, DOB 07-Oct-1945, MRN [id number]  Location: patient's home  Provider location:  [place]  Evaluation Performed: Follow-up visit  PCP:  [name], MD   Electrophysiologist:  Dr Rayann Heman  Chief Complaint:  palpitations  History of Present Illness:    Todd Chapman is a 74 y.o. male who presents via telehealth conferencing today.  Since last being seen in our clinic, the patient reports doing very well.  Today, he denies symptoms of palpitations, chest pain, shortness of breath,  lower extremity edema, dizziness, presyncope, or syncope.  The patient is otherwise without complaint today.   Past Medical History:  Diagnosis Date  . Arthritis    "thumbs, back" (02/16/2017)  . Bradycardia 02/2017  . Dyspnea    with exertion  . Flu 2 weeks ago  . GERD (gastroesophageal reflux disease)   . Hepatitis A as child  . HLD (hyperlipidemia)   . Persistent atrial fibrillation 12/18/2016  . Pneumonia 1999; 2018  . 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 APPENDECTOMY     age 70  . CARDIOVERSION  02/16/2017  . CARDIOVERSION N/A 02/16/2017   Procedure: CARDIOVERSION;  Surgeon: Thompson Grayer, MD;  Location: [place] [facility];  Service: Cardiovascular;  Laterality: N/A;  . COLONOSCOPY WITH PROPOFOL N/A 12/13/2012   Procedure: COLONOSCOPY WITH PROPOFOL;  Surgeon: Garlan Fair, MD;  Location: [facility];  Service: Endoscopy;  Laterality: N/A;  . FRACTURE SURGERY    . HYDROCELE EXCISION Left 05/16/2018   Procedure: HYDROCELECTOMY ADULT;  Surgeon: [name], MD;  Location:  [facility];  Service: Urology;  Laterality: Left;  . INSERT / REPLACE / REMOVE PACEMAKER  02/16/2017  . 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 WRIST FRACTURE SURGERY Left    with pins    Current Outpatient Medications  Medication Sig Dispense Refill  . acetaminophen (TYLENOL) 500 MG tablet Take 500 mg by mouth daily as needed for mild pain.    Marland Kitchen ELIQUIS 5 MG TABS tablet TAKE 1 TABLET(5 MG) BY MOUTH TWICE DAILY 180 tablet 1  . loratadine (CLARITIN) 10 MG tablet Take 10 mg by mouth daily as needed for allergies.    . Multiple Vitamin (MULTIVITAMIN WITH MINERALS) TABS tablet Take 1 tablet by mouth daily.    . Multiple Vitamins-Minerals (EYE VITAMINS) CAPS Take 1 capsule by mouth daily.     . mupirocin ointment (BACTROBAN) 2 % Place 1 application into the nose daily as needed (dry nose).    . pantoprazole (PROTONIX) 40 MG tablet Take 40 mg daily by mouth.  2  . rosuvastatin (CRESTOR) 10 MG tablet Take 10 mg by mouth every morning.     No current facility-administered medications for this visit.    Allergies:   Atorvastatin and Ezetimibe   Social History:  The patient  reports that he quit smoking about 53 years ago. His smoking use included cigarettes. He has a 0.96 pack-year smoking history. He has never used smokeless tobacco.  He reports current alcohol use. He reports that he does not use drugs.   ROS:  Please see the history of present illness.   All other systems are personally reviewed and negative.    Exam:    Vital Signs:  There were no vitals taken for this visit.  Well sounding and appearing, alert and conversant, regular work of breathing,  good skin color Eyes- anicteric, neuro- grossly intact, skin- no apparent rash or lesions or cyanosis, mouth- oral mucosa is pink  Labs/Other Tests and Data Reviewed:    Recent Labs: No results found for  requested labs within last 8760 hours.   Wt Readings from Last 3 Encounters:  10/12/18 209 lb (94.8 kg)  07/04/18 208 lb 9.6 oz (94.6 kg)  05/16/18 205 lb 12.8 oz (93.4 kg)     Last device remote is reviewed from [employer] which reveals normal device function, V paced 66%   ASSESSMENT & PLAN:    1.  Symptomatic bradycardia Normal pacemaker function Remotes are up to date No changes  2. Longstanding afib Asymptomatic Rate controlled chads2vasc sore is is 2. Continue eliquis  Follow-up:  12 months with me   Patient Risk:  after full review of this patients clinical status, I feel that they are at moderate risk at this time.  Today, I have spent 15 minutes with the patient with telehealth technology discussing arrhythmia management .    Army Fossa, MD  07/31/2019 3:15 PM     [address] [place] [address] [phone] (office) [phone] (fax)

## 2019-08-02 ENCOUNTER — Ambulatory Visit (INDEPENDENT_AMBULATORY_CARE_PROVIDER_SITE_OTHER): Payer: Medicare Other | Admitting: *Deleted

## 2019-08-02 DIAGNOSIS — I495 Sick sinus syndrome: Secondary | ICD-10-CM

## 2019-08-02 DIAGNOSIS — I4819 Other persistent atrial fibrillation: Secondary | ICD-10-CM

## 2019-08-03 LAB — CUP PACEART REMOTE DEVICE CHECK
Battery Remaining Longevity: 113 mo
Battery Remaining Percentage: 95.5 %
Battery Voltage: 2.99 V
Brady Statistic AP VP Percent: 0 %
Brady Statistic AP VS Percent: 0 %
Brady Statistic AS VP Percent: 0 %
Brady Statistic AS VS Percent: 0 %
Brady Statistic RA Percent Paced: 1 %
Brady Statistic RV Percent Paced: 66 %
Date Time Interrogation Session: 20210527015015
Implantable Lead Implant Date: 20181211
Implantable Lead Implant Date: 20181211
Implantable Lead Location: 753859
Implantable Lead Location: 753860
Implantable Pulse Generator Implant Date: 20181211
Lead Channel Impedance Value: 430 Ohm
Lead Channel Impedance Value: 490 Ohm
Lead Channel Pacing Threshold Amplitude: 0.625 V
Lead Channel Pacing Threshold Amplitude: 0.75 V
Lead Channel Pacing Threshold Pulse Width: 0.5 ms
Lead Channel Pacing Threshold Pulse Width: 0.5 ms
Lead Channel Sensing Intrinsic Amplitude: 12 mV
Lead Channel Sensing Intrinsic Amplitude: 5 mV
Lead Channel Setting Pacing Amplitude: 1.625
Lead Channel Setting Pacing Amplitude: 2.5 V
Lead Channel Setting Pacing Pulse Width: 0.5 ms
Lead Channel Setting Sensing Sensitivity: 2 mV
Pulse Gen Model: 2272
Pulse Gen Serial Number: 8967264

## 2019-08-03 NOTE — Progress Notes (Signed)
Remote pacemaker transmission.   

## 2019-08-10 DIAGNOSIS — L821 Other seborrheic keratosis: Secondary | ICD-10-CM | POA: Diagnosis not present

## 2019-08-10 DIAGNOSIS — L814 Other melanin hyperpigmentation: Secondary | ICD-10-CM | POA: Diagnosis not present

## 2019-08-10 DIAGNOSIS — D2371 Other benign neoplasm of skin of right lower limb, including hip: Secondary | ICD-10-CM | POA: Diagnosis not present

## 2019-08-10 DIAGNOSIS — L570 Actinic keratosis: Secondary | ICD-10-CM | POA: Diagnosis not present

## 2019-08-10 DIAGNOSIS — L817 Pigmented purpuric dermatosis: Secondary | ICD-10-CM | POA: Diagnosis not present

## 2019-08-10 DIAGNOSIS — D1801 Hemangioma of skin and subcutaneous tissue: Secondary | ICD-10-CM | POA: Diagnosis not present

## 2019-08-10 DIAGNOSIS — L111 Transient acantholytic dermatosis [Grover]: Secondary | ICD-10-CM | POA: Diagnosis not present

## 2019-08-11 DIAGNOSIS — H2511 Age-related nuclear cataract, right eye: Secondary | ICD-10-CM | POA: Diagnosis not present

## 2019-08-11 DIAGNOSIS — Z01818 Encounter for other preprocedural examination: Secondary | ICD-10-CM | POA: Diagnosis not present

## 2019-08-11 DIAGNOSIS — H2513 Age-related nuclear cataract, bilateral: Secondary | ICD-10-CM | POA: Diagnosis not present

## 2019-08-14 DIAGNOSIS — M461 Sacroiliitis, not elsewhere classified: Secondary | ICD-10-CM | POA: Diagnosis not present

## 2019-08-17 DIAGNOSIS — I4819 Other persistent atrial fibrillation: Secondary | ICD-10-CM | POA: Diagnosis not present

## 2019-08-17 DIAGNOSIS — H25811 Combined forms of age-related cataract, right eye: Secondary | ICD-10-CM | POA: Diagnosis not present

## 2019-09-05 ENCOUNTER — Other Ambulatory Visit: Payer: Self-pay | Admitting: Internal Medicine

## 2019-09-05 DIAGNOSIS — I4819 Other persistent atrial fibrillation: Secondary | ICD-10-CM

## 2019-09-05 NOTE — Telephone Encounter (Signed)
Eliquis 5mg  refill request received. Patient is 74 years old, weight-94.8kg, Crea-0.85 on 02/23/2019 via [facility] at [place], [place], and last seen by Dr. Rayann Heman on 07/31/2019. Dose is appropriate based on dosing criteria. Will send in refill to requested pharmacy.

## 2019-09-21 DIAGNOSIS — H25812 Combined forms of age-related cataract, left eye: Secondary | ICD-10-CM | POA: Diagnosis not present

## 2019-09-21 DIAGNOSIS — R001 Bradycardia, unspecified: Secondary | ICD-10-CM | POA: Diagnosis not present

## 2019-09-21 DIAGNOSIS — H2511 Age-related nuclear cataract, right eye: Secondary | ICD-10-CM | POA: Diagnosis not present

## 2019-09-21 DIAGNOSIS — Z7901 Long term (current) use of anticoagulants: Secondary | ICD-10-CM | POA: Diagnosis not present

## 2019-09-22 DIAGNOSIS — H35363 Drusen (degenerative) of macula, bilateral: Secondary | ICD-10-CM | POA: Diagnosis not present

## 2019-09-22 DIAGNOSIS — Z48810 Encounter for surgical aftercare following surgery on the sense organs: Secondary | ICD-10-CM | POA: Diagnosis not present

## 2019-09-22 DIAGNOSIS — D3132 Benign neoplasm of left choroid: Secondary | ICD-10-CM | POA: Diagnosis not present

## 2019-09-22 DIAGNOSIS — H02834 Dermatochalasis of left upper eyelid: Secondary | ICD-10-CM | POA: Diagnosis not present

## 2019-09-22 DIAGNOSIS — H02831 Dermatochalasis of right upper eyelid: Secondary | ICD-10-CM | POA: Diagnosis not present

## 2019-09-22 DIAGNOSIS — Z961 Presence of intraocular lens: Secondary | ICD-10-CM | POA: Diagnosis not present

## 2019-09-29 DIAGNOSIS — M461 Sacroiliitis, not elsewhere classified: Secondary | ICD-10-CM | POA: Diagnosis not present

## 2019-10-07 DIAGNOSIS — R972 Elevated prostate specific antigen [PSA]: Secondary | ICD-10-CM | POA: Diagnosis not present

## 2019-11-01 ENCOUNTER — Ambulatory Visit (INDEPENDENT_AMBULATORY_CARE_PROVIDER_SITE_OTHER): Payer: Medicare Other | Admitting: *Deleted

## 2019-11-01 DIAGNOSIS — I495 Sick sinus syndrome: Secondary | ICD-10-CM | POA: Diagnosis not present

## 2019-11-03 LAB — CUP PACEART REMOTE DEVICE CHECK
Battery Remaining Longevity: 110 mo
Battery Remaining Percentage: 95.5 %
Battery Voltage: 2.99 V
Brady Statistic AP VP Percent: 0 %
Brady Statistic AP VS Percent: 0 %
Brady Statistic AS VP Percent: 0 %
Brady Statistic AS VS Percent: 0 %
Brady Statistic RA Percent Paced: 1 %
Brady Statistic RV Percent Paced: 67 %
Date Time Interrogation Session: 20210826064852
Implantable Lead Implant Date: 20181211
Implantable Lead Implant Date: 20181211
Implantable Lead Location: 753859
Implantable Lead Location: 753860
Implantable Pulse Generator Implant Date: 20181211
Lead Channel Impedance Value: 380 Ohm
Lead Channel Impedance Value: 480 Ohm
Lead Channel Pacing Threshold Amplitude: 0.625 V
Lead Channel Pacing Threshold Amplitude: 0.75 V
Lead Channel Pacing Threshold Pulse Width: 0.5 ms
Lead Channel Pacing Threshold Pulse Width: 0.5 ms
Lead Channel Sensing Intrinsic Amplitude: 11.7 mV
Lead Channel Sensing Intrinsic Amplitude: 5 mV
Lead Channel Setting Pacing Amplitude: 1.625
Lead Channel Setting Pacing Amplitude: 2.5 V
Lead Channel Setting Pacing Pulse Width: 0.5 ms
Lead Channel Setting Sensing Sensitivity: 2 mV
Pulse Gen Model: 2272
Pulse Gen Serial Number: 8967264

## 2019-11-06 NOTE — Progress Notes (Signed)
Remote pacemaker transmission.   

## 2019-11-23 DIAGNOSIS — C61 Malignant neoplasm of prostate: Secondary | ICD-10-CM | POA: Diagnosis not present

## 2019-11-23 DIAGNOSIS — R972 Elevated prostate specific antigen [PSA]: Secondary | ICD-10-CM | POA: Diagnosis not present

## 2019-11-23 HISTORY — PX: PROSTATE BIOPSY: SHX241

## 2019-11-30 ENCOUNTER — Other Ambulatory Visit (HOSPITAL_COMMUNITY): Payer: Self-pay | Admitting: Urology

## 2019-11-30 ENCOUNTER — Other Ambulatory Visit: Payer: Self-pay | Admitting: Urology

## 2019-11-30 DIAGNOSIS — C61 Malignant neoplasm of prostate: Secondary | ICD-10-CM

## 2019-12-07 ENCOUNTER — Encounter (HOSPITAL_COMMUNITY)
Admission: RE | Admit: 2019-12-07 | Discharge: 2019-12-07 | Disposition: A | Discharge status: Home / Self Care | Payer: Medicare Other | Source: Ambulatory Visit | Attending: Urology | Admitting: Urology

## 2019-12-07 ENCOUNTER — Other Ambulatory Visit: Payer: Self-pay

## 2019-12-07 DIAGNOSIS — N400 Enlarged prostate without lower urinary tract symptoms: Secondary | ICD-10-CM | POA: Diagnosis not present

## 2019-12-07 DIAGNOSIS — M170 Bilateral primary osteoarthritis of knee: Secondary | ICD-10-CM | POA: Diagnosis not present

## 2019-12-07 DIAGNOSIS — C61 Malignant neoplasm of prostate: Secondary | ICD-10-CM | POA: Insufficient documentation

## 2019-12-07 DIAGNOSIS — K5730 Diverticulosis of large intestine without perforation or abscess without bleeding: Secondary | ICD-10-CM | POA: Diagnosis not present

## 2019-12-07 DIAGNOSIS — R300 Dysuria: Secondary | ICD-10-CM | POA: Diagnosis not present

## 2019-12-07 DIAGNOSIS — M160 Bilateral primary osteoarthritis of hip: Secondary | ICD-10-CM | POA: Diagnosis not present

## 2019-12-07 DIAGNOSIS — S2249XA Multiple fractures of ribs, unspecified side, initial encounter for closed fracture: Secondary | ICD-10-CM | POA: Diagnosis not present

## 2019-12-07 DIAGNOSIS — I700 Atherosclerosis of aorta: Secondary | ICD-10-CM | POA: Diagnosis not present

## 2019-12-07 DIAGNOSIS — Z8546 Personal history of malignant neoplasm of prostate: Secondary | ICD-10-CM | POA: Diagnosis not present

## 2019-12-07 IMAGING — NM NM BONE WHOLE BODY
2 series · 2 of 2 positions shown · non-contrast
Comparison: None

Correlation: CT abdomen and pelvis [DATE]

CLINICAL DATA: New diagnosis prostate cancer, PSA

EXAM:
NUCLEAR MEDICINE WHOLE BODY BONE SCAN
TECHNIQUE: Whole body anterior and posterior images were obtained approximately
3 hours after intravenous injection of radiopharmaceutical.
RADIOPHARMACEUTICALS:  19.9 mCi [QO] MDP IV

[Series 1: wbr_bone_40 whole body · 2.66mm/px · 1 of 1 slices shown (1 of 2)]
[im 1/1]
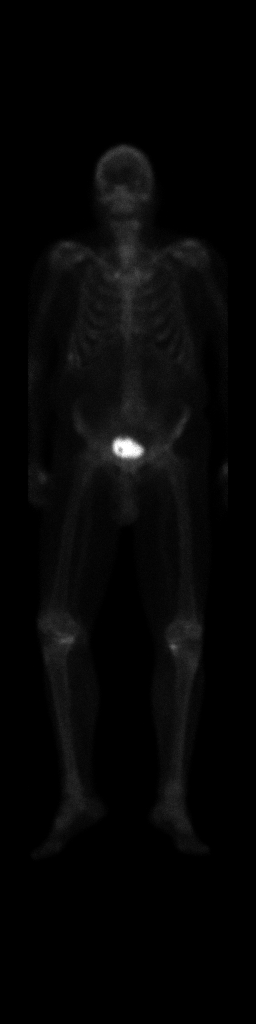

[Series 1: wbr_bone_40 whole body · 2.66mm/px · 1 of 1 slices shown (2 of 2)]
[im 1/1]
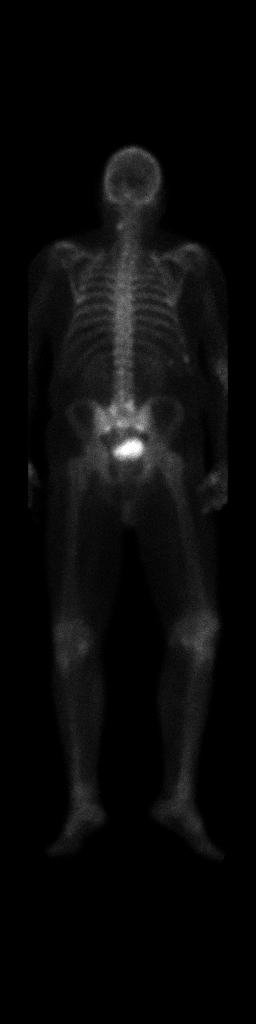

[2 of 2 positions shown; findings below may reference images not displayed]

FINDINGS: Uptake at the shoulders, sternoclavicular joints, hips, knees,
wrists, typically degenerative.

Uptake bilaterally L5-S1 likely degenerative, corresponding to facet
degenerative changes on CT.

Uptake at lateral LEFT mid cervical spine likely degenerative.

Uptake LEFT mandible typically related to dental disease.

Increased tracer uptake at a lower lateral RIGHT rib approximately
10th and faintly in adjacent posterior RIGHT eleventh rib
corresponding to old fractures on CT.

No additional sites of abnormal tracer accumulation are identified.

Expected urinary tract and soft tissue distribution of tracer.
IMPRESSION: No scintigraphic evidence of osseous metastatic disease.

## 2019-12-07 MED ORDER — TECHNETIUM TC 99M MEDRONATE IV KIT: 20.0000 | PACK | Freq: Once | INTRAVENOUS | Status: DC | PRN

## 2019-12-15 DIAGNOSIS — C61 Malignant neoplasm of prostate: Secondary | ICD-10-CM | POA: Diagnosis not present

## 2019-12-15 DIAGNOSIS — Z23 Encounter for immunization: Secondary | ICD-10-CM | POA: Diagnosis not present

## 2020-01-08 ENCOUNTER — Encounter: Payer: Self-pay | Admitting: Radiation Oncology

## 2020-01-08 DIAGNOSIS — B009 Herpesviral infection, unspecified: Secondary | ICD-10-CM | POA: Insufficient documentation

## 2020-01-08 DIAGNOSIS — Z860100 Personal history of colon polyps, unspecified: Secondary | ICD-10-CM | POA: Insufficient documentation

## 2020-01-08 DIAGNOSIS — K5900 Constipation, unspecified: Secondary | ICD-10-CM | POA: Insufficient documentation

## 2020-01-08 DIAGNOSIS — L719 Rosacea, unspecified: Secondary | ICD-10-CM | POA: Insufficient documentation

## 2020-01-08 DIAGNOSIS — K219 Gastro-esophageal reflux disease without esophagitis: Secondary | ICD-10-CM | POA: Insufficient documentation

## 2020-01-08 DIAGNOSIS — M1990 Unspecified osteoarthritis, unspecified site: Secondary | ICD-10-CM | POA: Insufficient documentation

## 2020-01-08 DIAGNOSIS — J40 Bronchitis, not specified as acute or chronic: Secondary | ICD-10-CM | POA: Insufficient documentation

## 2020-01-08 DIAGNOSIS — H269 Unspecified cataract: Secondary | ICD-10-CM | POA: Insufficient documentation

## 2020-01-08 DIAGNOSIS — K5730 Diverticulosis of large intestine without perforation or abscess without bleeding: Secondary | ICD-10-CM | POA: Insufficient documentation

## 2020-01-08 DIAGNOSIS — I8390 Asymptomatic varicose veins of unspecified lower extremity: Secondary | ICD-10-CM | POA: Insufficient documentation

## 2020-01-08 DIAGNOSIS — J069 Acute upper respiratory infection, unspecified: Secondary | ICD-10-CM | POA: Insufficient documentation

## 2020-01-08 DIAGNOSIS — Z79899 Other long term (current) drug therapy: Secondary | ICD-10-CM | POA: Insufficient documentation

## 2020-01-08 DIAGNOSIS — Z0000 Encounter for general adult medical examination without abnormal findings: Secondary | ICD-10-CM | POA: Insufficient documentation

## 2020-01-08 DIAGNOSIS — Z1389 Encounter for screening for other disorder: Secondary | ICD-10-CM | POA: Insufficient documentation

## 2020-01-08 DIAGNOSIS — E7800 Pure hypercholesterolemia, unspecified: Secondary | ICD-10-CM | POA: Insufficient documentation

## 2020-01-08 DIAGNOSIS — R768 Other specified abnormal immunological findings in serum: Secondary | ICD-10-CM | POA: Insufficient documentation

## 2020-01-08 DIAGNOSIS — G4700 Insomnia, unspecified: Secondary | ICD-10-CM | POA: Insufficient documentation

## 2020-01-08 DIAGNOSIS — H6120 Impacted cerumen, unspecified ear: Secondary | ICD-10-CM | POA: Insufficient documentation

## 2020-01-08 HISTORY — DX: Constipation, unspecified: K59.00

## 2020-01-08 NOTE — Progress Notes (Signed)
GU Location of Tumor / Histology: prostatic adenocarcinoma  If Prostate Cancer, Gleason Score is (4 + 5) and PSA is (5.90). Prostate volume: 38.61  [name] returned to Dr. Diona Fanti in May 2021 for further evaluation of an elevated PSA. He was previously treated in 2019-2020 for management of hydrocele.   January 2021  psa  4.99 December 2020 psa  5.96 December 2019 psa  6.03 September 2016  psa  3.68  Biopsies of prostate (if applicable) revealed:    Past/Anticipated interventions by urology, if any: prostate biopsy, CT abd/pelvis (negative), bone scan (negative), referral to Dr. Tammi Klippel to discuss radiotherapeutic options.  Past/Anticipated interventions by medical oncology, if any: no  Weight changes, if any: denies  Bowel/Bladder complaints, if any: IPSS 7. SHIM 2 (no activity). Reports dysuria. Patient explains that a urine culture done by Dr. Diona Fanti within the last four weeks did not show a urinary tract infection. Reports hematuria resolved. Denies urinary leakage or incontinence. Denies any bowel complaints.    Nausea/Vomiting, if any: denies  Pain issues, if any:  Generalized aches and pains associated with age  SAFETY ISSUES:  Prior radiation? denies  Pacemaker/ICD? yes, managed by Dr. Rayann Heman  Possible current pregnancy? No, male patient  Is the patient on methotrexate? denies  Current Complaints / other details:  74 year old male. Married. Resides in [place], [place]. Works as a Surveyor, minerals.

## 2020-01-09 ENCOUNTER — Encounter: Payer: Self-pay | Admitting: Radiation Oncology

## 2020-01-09 ENCOUNTER — Ambulatory Visit
Admission: RE | Admit: 2020-01-09 | Discharge: 2020-01-09 | Disposition: A | Discharge status: Home / Self Care | Payer: Medicare Other | Source: Ambulatory Visit | Attending: Radiation Oncology | Admitting: Radiation Oncology

## 2020-01-09 ENCOUNTER — Other Ambulatory Visit: Payer: Self-pay

## 2020-01-09 VITALS — Ht 74.0 in | Wt 207.0 lb

## 2020-01-09 DIAGNOSIS — C61 Malignant neoplasm of prostate: Secondary | ICD-10-CM

## 2020-01-09 DIAGNOSIS — R972 Elevated prostate specific antigen [PSA]: Secondary | ICD-10-CM | POA: Diagnosis not present

## 2020-01-09 HISTORY — DX: Malignant neoplasm of prostate: C61

## 2020-01-09 NOTE — Progress Notes (Signed)
Radiation Oncology         (336) [phone] ________________________________  Initial Outpatient Consultation - Conducted via Telephone due to current COVID-19 concerns for limiting patient exposure  Name: Todd Chapman MRN: [id number]  Date: 01/09/2020  DOB: Feb 25, 1946  NI:OEVOJ, Herbie [place], MD  [name], MD   REFERRING PHYSICIAN: [name], MD  DIAGNOSIS: 74 y.o. gentleman with Stage T1c adenocarcinoma of the prostate with Gleason score of 4+5, and PSA of 5.9.    ICD-10-CM   1. Malignant neoplasm of prostate ([place])  C61     HISTORY OF PRESENT ILLNESS: Todd Chapman is a 74 y.o. male with a diagnosis of prostate cancer. He had previously seen Dr. Diona Fanti for hydrocele in 2019 and underwent successful hydrocelectomy with Dr. Diona Fanti in 2020.  He also has a history of elevated PSA, followed by his primary care physician, Dr. Inda Merlin. His PSA was 6.28 in December 2019 and decreased slightly to 5.96 in December 2020 and remained stable at 4.99 when repeated 1 month later on March 28, 2019.  Accordingly, he was referred back to Dr. Diona Fanti on 07/19/2019 for further evaluation and digital rectal examination was performed at that time revealing no nodules.  A repeat PSA on 10/06/2019 was further elevated at 5.9.  Therefore, the patient proceeded to transrectal ultrasound with 12 biopsies of the prostate on 11/23/2019.  The prostate volume measured 38.61 cc.  Out of 12 core biopsies, 5 were positive.  The maximum Gleason score was 4+5, and this was seen in the right apex lateral. Additionally, Gleason 4+3 was seen in the right base (small focus), Gleason 3+4 in the right apex and right base lateral, and Gleason 3+3 in the left apex lateral. He states that Dr. Diona Fanti has sent his pathology to Todd Chapman for a second opinion. The results from the second opinion are not yet available.  He underwent staging studies on 12/07/2019 with CT A/P which was without evidence of metastatic  disease and a bone scan which was also negative for metastatic disease.  The patient reviewed the biopsy results with his urologist and he has kindly been referred today for discussion of potential radiation treatment options.  His wife, [name], also accompanies him by phone for today's visit.   PREVIOUS RADIATION THERAPY: No  PAST MEDICAL HISTORY:  Past Medical History:  Diagnosis Date  . Arthritis    "thumbs, back" (02/16/2017)  . Bradycardia 02/2017  . Dyspnea    with exertion  . Flu 2 weeks ago  . GERD (gastroesophageal reflux disease)   . Hepatitis A as child  . HLD (hyperlipidemia)   . Persistent atrial fibrillation ([place]) 12/18/2016  . Pneumonia 1999; 2018  . 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 Prostate cancer ([place])       PAST SURGICAL HISTORY: Past Surgical History:  Procedure Laterality Date  . APPENDECTOMY     age 74  . CARDIOVERSION  02/16/2017  . CARDIOVERSION N/A 02/16/2017   Procedure: CARDIOVERSION;  Surgeon: Thompson Grayer, MD;  Location: [facility];  Service: Cardiovascular;  Laterality: N/A;  . COLONOSCOPY WITH PROPOFOL N/A 12/13/2012   Procedure: COLONOSCOPY WITH PROPOFOL;  Surgeon: Garlan Fair, MD;  Location: [facility];  Service: Endoscopy;  Laterality: N/A;  . FRACTURE SURGERY    . HYDROCELE EXCISION Left 05/16/2018   Procedure: HYDROCELECTOMY ADULT;  Surgeon: [name], MD;  [facility];  Service: Urology;  Laterality: Left;  . INSERT / REPLACE /  REMOVE PACEMAKER  02/16/2017  . 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 PROSTATE BIOPSY  11/23/2019  . WRIST FRACTURE SURGERY Left    with pins    FAMILY HISTORY:  Family History  Problem Relation Age of Onset  . CVA Mother   . Coronary artery disease Mother   . Stroke Mother   . Coronary artery disease Father   . Heart attack  Father   . Stomach cancer Maternal Grandmother   . Breast cancer Neg Hx   . Prostate cancer Neg Hx   . Colon cancer Neg Hx   . 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 Social History   Socioeconomic History  . Marital status: Married    Spouse name: Not on file  . Number of children: 2  . Years of education: Not on file  . Highest education level: Not on file  Occupational History  . Not on file  Tobacco Use  . Smoking status: Former Smoker    Packs/day: 0.12    Years: 8.00    Pack years: 0.96    Types: Cigarettes    Quit date: 11/24/1965    Years since quitting: 54.1  . Smokeless tobacco: Never Used  Vaping Use  . Vaping Use: Never used  Substance and Sexual Activity  . Alcohol use: Yes    Comment: 02/16/2017 "might have 1 drink/month"  . Drug use: No  . Sexual activity: Yes  Other Topics Concern  . Not on file  Social History Narrative   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 Strain:   . Difficulty of Paying Living Expenses: Not on file  Food Insecurity:   . Worried About Charity fundraiser in the Last Year: Not on file  . Ran Out of Food in the Last Year: Not on file  Transportation Needs:   . Lack of Transportation (Medical): Not on file  . Lack of Transportation (Non-Medical): Not on file  Physical Activity:   . Days of Exercise per Week: Not on file  . Minutes of Exercise per Session: Not on file  Stress:   . Feeling of Stress : Not on file  Social Connections:   . Frequency of Communication with Friends and Family: Not on file  . Frequency of Social Gatherings with Friends and Family: Not on file  . Attends Religious Services: Not on file  . Active Member of Clubs or Organizations: Not on file  . Attends Archivist Meetings: Not on file  . Marital Status: Not on file  Intimate Partner Violence:   . Fear of Current or Ex-Partner: Not on  file  . Emotionally Abused: Not on file  . Physically Abused: Not on file  . Sexually Abused: Not on file    ALLERGIES: Atorvastatin and Ezetimibe  MEDICATIONS:  Current Outpatient Medications  Medication Sig Dispense Refill  . acetaminophen (TYLENOL) 500 MG tablet Take 500 mg by mouth daily as needed for mild pain.    Marland Kitchen ELIQUIS 5 MG TABS tablet TAKE 1 TABLET(5 MG) BY MOUTH TWICE DAILY 180 tablet 1  . loratadine (CLARITIN) 10 MG tablet Take 10 mg by mouth daily as needed for allergies.    . Multiple Vitamin (MULTIVITAMIN WITH MINERALS) TABS tablet Take 1 tablet by mouth daily.    Marland Kitchen  Multiple Vitamins-Minerals (EYE VITAMINS) CAPS Take 1 capsule by mouth daily.     . pantoprazole (PROTONIX) 40 MG tablet Take 40 mg daily by mouth.  2  . rosuvastatin (CRESTOR) 10 MG tablet Take 10 mg by mouth every morning.    . meloxicam (MOBIC) 7.5 MG tablet SMARTSIG:1 Tablet(s) By Mouth 2-3 Times Weekly PRN (Patient not taking: Reported on 01/09/2020)    . mupirocin ointment (BACTROBAN) 2 % Place 1 application into the nose daily as needed (dry nose). (Patient not taking: Reported on 01/09/2020)     No current facility-administered medications for this encounter.    REVIEW OF SYSTEMS:  On review of systems, the patient reports that he is doing well overall. He denies any chest pain, shortness of breath, cough, fevers, chills, night sweats, unintended weight changes. He denies any bowel disturbances, and denies abdominal pain, nausea or vomiting. He denies any new musculoskeletal or joint aches or pains. His IPSS was 7, indicating moderate urinary symptoms. He reports dysuria, for which he notes he underwent a urine culture that was negative. His SHIM was 2, indicating he has severe erectile dysfunction. He reports he is not currently sexually active. A complete review of systems is obtained and is otherwise negative.  PHYSICAL EXAM:  Wt Readings from Last 3 Encounters:  01/09/20 207 lb (93.9 kg)  10/12/18  209 lb (94.8 kg)  07/04/18 208 lb 9.6 oz (94.6 kg)   Temp Readings from Last 3 Encounters:  05/16/18 97.6 F (36.4 C)  02/17/17 98 F (36.7 C) (Oral)  12/13/12 98.5 F (36.9 C) (Oral)   BP Readings from Last 3 Encounters:  11/08/18 128/78  10/12/18 134/82  07/04/18 130/70   Pulse Readings from Last 3 Encounters:  11/08/18 (!) 58  10/12/18 60  07/04/18 74   Pain Assessment Pain Score: 0-No pain/10  Physical exam not performed in light of telephone consult visit format.   KPS = 100  100 - Normal; no complaints; no evidence of disease. 90   - Able to carry on normal activity; minor signs or symptoms of disease. 80   - Normal activity with effort; some signs or symptoms of disease. 30   - Cares for self; unable to carry on normal activity or to do active work. 60   - Requires occasional assistance, but is able to care for most of his personal needs. 50   - Requires considerable assistance and frequent medical care. 62   - Disabled; requires special care and assistance. 63   - Severely disabled; Chapman admission is indicated although death not imminent. 64   - Very sick; Chapman admission necessary; active supportive treatment necessary. 10   - Moribund; fatal processes progressing rapidly. 0     - Dead  Karnofsky DA, Abelmann [place], Craver LS and Burchenal [place] Chapman [phone]) The use of the nitrogen mustards in the palliative treatment of carcinoma: with particular reference to bronchogenic carcinoma Cancer 1 634-56  LABORATORY DATA:  Lab Results  Component Value Date   WBC 5.8 02/08/2017   HGB 13.6 05/16/2018   HCT 40.0 05/16/2018   MCV 89 02/08/2017   PLT 168 02/08/2017   Lab Results  Component Value Date   NA 144 05/16/2018   K 4.3 05/16/2018   CL 105 02/17/2017   CO2 28 02/17/2017   No results found for: ALT, AST, GGT, ALKPHOS, BILITOT   RADIOGRAPHY: No results found.    IMPRESSION/PLAN: This visit was conducted via Telephone to spare the patient unnecessary  potential exposure in the healthcare setting during the current COVID-19 pandemic. 1. 74 y.o. gentleman with Stage T1c adenocarcinoma of the prostate with Gleason Score of 4+5, and PSA of 5.9. We discussed the patient's workup and outlined the nature of prostate cancer in this setting. The patient's T stage, Gleason's score, and PSA put him into the high risk group. Accordingly, he is eligible for a variety of potential treatment options including prostatectomy or LT-ADT in combination with either 8 weeks of external radiation or 5 weeks of external radiation preceded by a brachytherapy boost. We discussed the available radiation techniques, and focused on the details and logistics of delivery. We discussed and outlined the risks, benefits, short and long-term effects associated with radiotherapy and compared and contrasted these with prostatectomy. We discussed the role of SpaceOAR in reducing the rectal toxicity associated with radiotherapy. We also detailed the role of ADT in the treatment of high risk prostate cancer and outlined the associated side effects that could be expected with this therapy.  We also discussed the intentional delay in starting radiation treatment for 8 weeks after the start of ADT to allow for the radiosensitizing effects of this therapy. He and his wife were encouraged to ask questions that were answered to their stated satisfaction.  At the end of the conversation, the patient is interested in moving forward with LT-ADT concurrent with brachytherapy boost and use of SpaceOAR gel followed by a 5 week course of daily radiotherapy.  We will share our discussion with Dr. Diona Fanti and move forward with coordinating a follow-up visit to start ADT, first available.  We will also work to coordinate his [facility] planning appointment in late December/early January in anticipation of proceeding with his seed boost procedure in early January 2022.  The patient will be contacted by Romie Jumper  in our office who will be working closely with him to coordinate OR scheduling and pre and post procedure appointments.  We will contact the pharmaceutical rep to ensure that [place] is available at the time of procedure.  Approximately 3 weeks after his seed boost procedure, we will proceed with the 5-1/2-week course of daily external beam radiation.  He appears to have a good understanding of his disease and our treatment recommendations which are of curative intent and he is in agreement with the stated plan.   Given current concerns for patient exposure during the COVID-19 pandemic, this encounter was conducted via telephone. The patient was notified in advance and was offered a MyChart meeting to allow for face to face communication but unfortunately reported that he did not have the appropriate resources/technology to support such a visit and instead preferred to proceed with telephone consult. The patient has given verbal consent for this type of encounter. The time spent during this encounter was 60 minutes. The attendants for this meeting include Tyler Pita MD, Ashlyn Bruning PA-C, [place], and patient, JAK HAGGAR and his wife, [name]. During the encounter, Tyler Pita MD, Ashlyn Bruning PA-C, and scribe, Wilburn Mylar were located at [place].  Patient, TOMMEY BARRET and his wife, [name] were located at home.    Nicholos Johns, PA-C    Tyler Pita, MD  [facility] Direct Dial: [phone]  Fax: [phone] .com  Skype  LinkedIn  This document serves as a record of services personally performed by Tyler Pita, MD and Freeman Caldron, PA-C. It was created on their behalf by Wilburn Mylar,  a trained medical scribe. The creation of this record is based on the scribe's personal observations and the provider's statements to them. This document has been  checked and approved by the attending provider.

## 2020-01-10 ENCOUNTER — Telehealth: Payer: Self-pay | Admitting: Internal Medicine

## 2020-01-10 NOTE — Telephone Encounter (Signed)
Could someone please reach out to patient - I tried Wed, but no VM on home or cell.  Weight for patient is > 1 year, would like to confirm he still weighs close to that.  Also has been since 2018 that he had CBC (in [place]), will need to get that done for clearance.  I am out of the office Thur-Mon, so send back to pool if you can with that info.  Thanks,

## 2020-01-10 NOTE — Telephone Encounter (Signed)
Clinical pharmacist to review Eliquis 

## 2020-01-10 NOTE — Telephone Encounter (Signed)
       [facility] Pre-operative Risk Assessment    HEARTCARE STAFF: - Please ensure there is not already an duplicate clearance open for this procedure. - Under Visit Info/Reason for Call, type in Other and utilize the format Clearance MM/DD/YY or Clearance TBD. Do not use dashes or single digits. - If request is for dental extraction, please clarify the # of teeth to be extracted.  Request for surgical clearance:  1. What type of surgery is being performed? brachytherapy, prostate implant  2. When is this surgery scheduled? TBD  3. What type of clearance is required (medical clearance vs. Pharmacy clearance to hold med vs. Both)? Both  4. Are there any medications that need to be held prior to surgery and how long? Any blood thinners need to be held 5-7 days prior surgery   5. Practice name and name of physician performing surgery? Dr. Philis Pique  6. What is the office phone number? [phone]   7.   What is the office fax number? [phone] attn to [facility]  8.   Anesthesia type (None, local, MAC, general) ? General   Todd Chapman 01/10/2020, 10:47 AM  _________________________________________________________________   (provider comments below)

## 2020-01-11 ENCOUNTER — Telehealth: Payer: Self-pay | Admitting: Radiation Oncology

## 2020-01-11 ENCOUNTER — Encounter: Payer: Self-pay | Admitting: Urology

## 2020-01-11 NOTE — Progress Notes (Signed)
Per communication with Dr. Diona Fanti, Mr. Boakye has decided on monotherapy w/ IMRT alone. He is scheduled for [place] in 01/19/20 and would like to start XRT 3rd week of January. I have sent a request to Romie Jumper, notifying that this patient will get ADT on 01/19/20, and we'll scratch the seed boost procedure and move forward with coordinating for fiducials and SpaceOAR gel, prior to CT [facility] in early January 2022, in anticipation of beginning an 8 week course of daily EBRT around the 3rd week of Jan. 2022. Also copied Dr. Tammi Klippel and Cira Rue so that they are aware of the change in treatment plan.  Nicholos Johns, MMS, PA-C [facility] [place]: [phone]  Fax: [phone]

## 2020-01-11 NOTE — Telephone Encounter (Signed)
I left a message on the patient's phone instructing him to call us back and speak to the on call preop APP of the day

## 2020-01-11 NOTE — Telephone Encounter (Signed)
[place] @ [place] ICD form to the device clinic for completion. Fax confirmation of delivery obtained.

## 2020-01-16 ENCOUNTER — Telehealth: Payer: Self-pay | Admitting: *Deleted

## 2020-01-16 NOTE — Telephone Encounter (Signed)
RETURNED PATIENT'S PHONE CALL, SPOKE WITH PATIENT. ?

## 2020-01-16 NOTE — Telephone Encounter (Signed)
Patient with diagnosis of A Fib on Eliquis for anticoagulation.    Procedure: brachytherapy, prostate implant Date of procedure: TBD   CHA2DS2-VASc Score = 3  This indicates a 3.2% annual risk of stroke. The patient's score is based upon: CHF History: 0 HTN History: 1 Diabetes History: 0 Stroke History: 0 Vascular Disease History: 0 Age Score: 2 Gender Score: 0    CrCl 94 mL/min Platelet count: Unknown  Typically recommend holding Eliquis for ~3 days for prostate procedures.  Surgeon is requesting a 5-7 day hold.  Will route to MD for input

## 2020-01-16 NOTE — Telephone Encounter (Signed)
   Primary Cardiologist: Thompson Grayer, MD  Chart reviewed as part of pre-operative protocol coverage. Patient was contacted 01/16/2020 in reference to pre-operative risk assessment for pending surgery as outlined below.  Todd Chapman was last seen on 07/31/19 by Dr. Rayann Heman via a telemedicine visit.  Since that day, Todd Chapman has done well from a cardiac standpoint. He has chronic DOE which is unchanged in recent months. He was at they gym at the time of my call. He can easily complete 4 METs without anginal complaints  Therefore, based on ACC/AHA guidelines, the patient would be at acceptable risk for the planned procedure without further cardiovascular testing.   The patient was advised that if he develops new symptoms prior to surgery to contact our office to arrange for a follow-up visit, and he verbalized understanding.  Clarified recent weight of 209lbs per pharmacy. Will await final recommendations from pharmacy for holding eliquis prior to his procedure before sending finalized preop clearance. Patient asks for a call back with final recommendations.   Abigail Butts, PA-C 01/16/2020, 9:39 AM

## 2020-01-17 ENCOUNTER — Telehealth: Payer: Self-pay | Admitting: *Deleted

## 2020-01-17 NOTE — Telephone Encounter (Signed)
Called patient to update, lvm for a return call 

## 2020-01-19 ENCOUNTER — Telehealth: Payer: Self-pay | Admitting: *Deleted

## 2020-01-19 ENCOUNTER — Encounter: Payer: Self-pay | Admitting: Medical Oncology

## 2020-01-19 DIAGNOSIS — Z5111 Encounter for antineoplastic chemotherapy: Secondary | ICD-10-CM | POA: Diagnosis not present

## 2020-01-19 DIAGNOSIS — C61 Malignant neoplasm of prostate: Secondary | ICD-10-CM | POA: Diagnosis not present

## 2020-01-19 NOTE — Progress Notes (Signed)
Spoke with Todd Chapman to introduce myself as the prostate nurse navigator and discuss my role. I was unable to meet him 11/2, when he consulted with Dr. Tammi Klippel. He has chosen ADT and radiation to treat his prostate cancer. 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 He received his ADT injection today and realizes he will not start treatment until 8 weeks later. He has openings on 1/20 and 1/27 and would like to have fiducial markers and SpaceOar. I will speak with Enid Derry and give her these dates. All questions were answered and I gave him my office number and asked him to call me with questions or concerns. He voiced understanding and was very appreciative of my time.

## 2020-01-19 NOTE — Telephone Encounter (Signed)
CALLED PATIENT TO ASK QUESTION, LVM FOR A RETURN CALL 

## 2020-01-20 NOTE — Telephone Encounter (Signed)
My recommendation according to manufacturer recommendation is to hold the medicine for 48 hours.  If a longer duration of hold is felt to be required by the surgeon then I will defer to his judgement.

## 2020-01-25 ENCOUNTER — Telehealth: Payer: Self-pay | Admitting: *Deleted

## 2020-01-25 NOTE — Telephone Encounter (Signed)
RETURNED PATIENT'S PHONE CALL, SPOKE WITH PATIENT. ?

## 2020-01-31 ENCOUNTER — Ambulatory Visit (INDEPENDENT_AMBULATORY_CARE_PROVIDER_SITE_OTHER): Payer: Medicare Other

## 2020-01-31 DIAGNOSIS — I495 Sick sinus syndrome: Secondary | ICD-10-CM | POA: Diagnosis not present

## 2020-02-02 LAB — CUP PACEART REMOTE DEVICE CHECK
Battery Remaining Longevity: 112 mo
Battery Remaining Percentage: 95.5 %
Battery Voltage: 2.99 V
Brady Statistic AP VP Percent: 0 %
Brady Statistic AP VS Percent: 0 %
Brady Statistic AS VP Percent: 0 %
Brady Statistic AS VS Percent: 0 %
Brady Statistic RA Percent Paced: 1 %
Brady Statistic RV Percent Paced: 69 %
Date Time Interrogation Session: 20211125015032
Implantable Lead Implant Date: 20181211
Implantable Lead Implant Date: 20181211
Implantable Lead Location: 753859
Implantable Lead Location: 753860
Implantable Pulse Generator Implant Date: 20181211
Lead Channel Impedance Value: 400 Ohm
Lead Channel Impedance Value: 510 Ohm
Lead Channel Pacing Threshold Amplitude: 0.625 V
Lead Channel Pacing Threshold Amplitude: 0.75 V
Lead Channel Pacing Threshold Pulse Width: 0.5 ms
Lead Channel Pacing Threshold Pulse Width: 0.5 ms
Lead Channel Sensing Intrinsic Amplitude: 10 mV
Lead Channel Sensing Intrinsic Amplitude: 5 mV
Lead Channel Setting Pacing Amplitude: 1.625
Lead Channel Setting Pacing Amplitude: 2.5 V
Lead Channel Setting Pacing Pulse Width: 0.5 ms
Lead Channel Setting Sensing Sensitivity: 2 mV
Pulse Gen Model: 2272
Pulse Gen Serial Number: 8967264

## 2020-02-06 ENCOUNTER — Other Ambulatory Visit: Payer: Self-pay | Admitting: Urology

## 2020-02-06 DIAGNOSIS — C61 Malignant neoplasm of prostate: Secondary | ICD-10-CM

## 2020-02-09 NOTE — Progress Notes (Signed)
Remote pacemaker transmission.   

## 2020-02-21 DIAGNOSIS — Z5111 Encounter for antineoplastic chemotherapy: Secondary | ICD-10-CM | POA: Diagnosis not present

## 2020-02-21 DIAGNOSIS — C61 Malignant neoplasm of prostate: Secondary | ICD-10-CM | POA: Diagnosis not present

## 2020-02-27 DIAGNOSIS — C61 Malignant neoplasm of prostate: Secondary | ICD-10-CM | POA: Diagnosis not present

## 2020-03-02 ENCOUNTER — Other Ambulatory Visit: Payer: Self-pay | Admitting: Internal Medicine

## 2020-03-02 DIAGNOSIS — I4819 Other persistent atrial fibrillation: Secondary | ICD-10-CM

## 2020-03-04 NOTE — Telephone Encounter (Signed)
Prescription refill request for Eliquis received. Indication: Afib Last office visit: 07/31/2019 Scr: 12/07/2019, 0.9 Age: 74 yo Weight: 93.9 kg   Prescription refill sent.

## 2020-03-07 ENCOUNTER — Encounter (HOSPITAL_COMMUNITY): Payer: Self-pay

## 2020-03-11 ENCOUNTER — Telehealth: Payer: Self-pay | Admitting: *Deleted

## 2020-03-11 NOTE — Telephone Encounter (Signed)
CALLED PATIENT TO REMIND OF SIM AND MRI FOR 03-12-20, SPOKE WITH PATIENT AND HE IS AWARE OF THESE APPTS.

## 2020-03-12 ENCOUNTER — Other Ambulatory Visit: Payer: Self-pay

## 2020-03-12 ENCOUNTER — Encounter: Payer: Self-pay | Admitting: Medical Oncology

## 2020-03-12 ENCOUNTER — Telehealth: Payer: Self-pay | Admitting: *Deleted

## 2020-03-12 ENCOUNTER — Ambulatory Visit
Admission: RE | Admit: 2020-03-12 | Discharge: 2020-03-12 | Disposition: A | Discharge status: Home / Self Care | Payer: Medicare Other | Source: Ambulatory Visit | Attending: Radiation Oncology | Admitting: Radiation Oncology

## 2020-03-12 ENCOUNTER — Ambulatory Visit (HOSPITAL_COMMUNITY): Admission: RE | Admit: 2020-03-12 | Payer: Medicare Other | Source: Ambulatory Visit

## 2020-03-12 DIAGNOSIS — C61 Malignant neoplasm of prostate: Secondary | ICD-10-CM | POA: Diagnosis not present

## 2020-03-12 DIAGNOSIS — Z510 Encounter for antineoplastic radiation therapy: Secondary | ICD-10-CM | POA: Diagnosis not present

## 2020-03-12 NOTE — Telephone Encounter (Signed)
CALLED PATIENT TO INFORM THAT MRI WILL NOT BE DONE, DUE TO NOT HAVING AVAILABILITY UNTIL 03-29-20, SPOKE WITH PATIENT AND HE IS AWARE OF THIS

## 2020-03-12 NOTE — Progress Notes (Signed)
  Radiation Oncology         [phone]) [phone] ________________________________  Name: Todd Chapman MRN: [id number]  Date: 03/12/2020  DOB: Jun 04, 1945  SIMULATION AND TREATMENT PLANNING NOTE    ICD-10-CM   1. Malignant neoplasm of prostate ([facility])  C61     DIAGNOSIS:  75 y.o. gentleman with Stage T1c adenocarcinoma of the prostate with Gleason score of 4+5, and PSA of 5.9.  NARRATIVE:  The patient was brought to the CT Simulation planning suite.  Identity was confirmed.  All relevant records and images related to the planned course of therapy were reviewed.  The patient freely provided informed written consent to proceed with treatment after reviewing the details related to the planned course of therapy. The consent form was witnessed and verified by the simulation staff.  Then, the patient was set-up in a stable reproducible supine position for radiation therapy.  A vacuum lock pillow device was custom fabricated to position his legs in a reproducible immobilized position.  Then, I performed a urethrogram under sterile conditions to identify the prostatic bed.  CT images were obtained.  Surface markings were placed.  The CT images were loaded into the planning software.  Then the prostate bed target, pelvic lymph node target and avoidance structures including the rectum, bladder, bowel and hips were contoured.  Treatment planning then occurred.  The radiation prescription was entered and confirmed.  A total of one complex treatment devices were fabricated. I have requested : Intensity Modulated Radiotherapy (IMRT) is medically necessary for this case for the following reason:  Rectal sparing.Marland Kitchen  PLAN:  The patient will receive 45 Gy in 25 fractions of 1.8 Gy, followed by a boost to the prostate to a total dose of 75 Gy with 15 additional fractions of 2 Gy.   ________________________________  Artist Pais Kathrynn Running, M.D.

## 2020-03-12 NOTE — Progress Notes (Signed)
Patient left voicemail that he was contacted by radiation and he has been scheduled for MRI 03/13/20 @1  pm. He voiced his appreciation for my assistance. He is scheduled to start radiation 1/12@8 :15 am.

## 2020-03-12 NOTE — Progress Notes (Signed)
Patient here for CT simulation. He was scheduled for MRI @ 1 pm today but noted it was cancelled. MRI contacted and was it was scheduled at [facility], but patient has a pacemaker so it will need to be done at [facility] radiology. Pt made a ware and I will ask [facility] [place] will reschedule and he will be called with an appointment.

## 2020-03-12 NOTE — Telephone Encounter (Signed)
Sent several secure chats regarding this patient, unable to obtain MRI until Jan. 21, per [facility], we will forego MRI due to not being able to obtain MRI until Jan. 21, notified patient

## 2020-03-12 NOTE — Progress Notes (Signed)
Received call from patient voicing his concerns and frustration about MRI being cancelled.  He states he was told the importance of the MRI during simulation and now he is being told he will not need. I explained the purpose of the MRI and that treatment can been done effectively and safely with out the scan. We prefer the MRI but  If they cannot accommodate him in the next 48 hours, the option would be to delay treatment start and repeat a CT SIM once we have a date for the MRI, or just forego the MRI.  Dr. Kathrynn Running says it just means that it will take an extra 2-3 minutes daily for treatments to ensure positioning accuracy but is certainly is do-able. I informed him we have reached out  to the head radiology scheduler requester her help to get rescheudled ASAP. I asked him not to come to 1 pm appointment today and I will follow up with him, once we hear back from MRI. He voiced undersrtanding.

## 2020-03-13 ENCOUNTER — Ambulatory Visit (HOSPITAL_COMMUNITY)
Admission: RE | Admit: 2020-03-13 | Discharge: 2020-03-13 | Disposition: A | Discharge status: Home / Self Care | Payer: Medicare Other | Source: Ambulatory Visit | Attending: Urology | Admitting: Urology

## 2020-03-13 ENCOUNTER — Encounter (HOSPITAL_COMMUNITY): Payer: Self-pay | Admitting: *Deleted

## 2020-03-13 DIAGNOSIS — C61 Malignant neoplasm of prostate: Secondary | ICD-10-CM | POA: Diagnosis not present

## 2020-03-13 NOTE — Progress Notes (Signed)
Donnald Garre the representative from [employer] adjusted patient's settings per order.  "Change device settings for MRI to  VOO at 100 bpm  Program device back to pre-MRI settings after completion of exam and send transmission."  Will monitor patient during scan and will program device back afterwards and sent transmission.

## 2020-03-18 DIAGNOSIS — C61 Malignant neoplasm of prostate: Secondary | ICD-10-CM | POA: Diagnosis not present

## 2020-03-18 DIAGNOSIS — Z510 Encounter for antineoplastic radiation therapy: Secondary | ICD-10-CM | POA: Diagnosis not present

## 2020-03-20 ENCOUNTER — Other Ambulatory Visit: Payer: Self-pay

## 2020-03-20 ENCOUNTER — Ambulatory Visit
Admission: RE | Admit: 2020-03-20 | Discharge: 2020-03-20 | Disposition: A | Discharge status: Home / Self Care | Payer: Medicare Other | Source: Ambulatory Visit | Attending: Radiation Oncology | Admitting: Radiation Oncology

## 2020-03-20 DIAGNOSIS — C61 Malignant neoplasm of prostate: Secondary | ICD-10-CM | POA: Diagnosis not present

## 2020-03-20 DIAGNOSIS — Z510 Encounter for antineoplastic radiation therapy: Secondary | ICD-10-CM | POA: Diagnosis not present

## 2020-03-21 ENCOUNTER — Other Ambulatory Visit: Payer: Self-pay

## 2020-03-21 ENCOUNTER — Ambulatory Visit
Admission: RE | Admit: 2020-03-21 | Discharge: 2020-03-21 | Disposition: A | Discharge status: Home / Self Care | Payer: Medicare Other | Source: Ambulatory Visit | Attending: Radiation Oncology | Admitting: Radiation Oncology

## 2020-03-21 ENCOUNTER — Encounter: Payer: Self-pay | Admitting: Medical Oncology

## 2020-03-21 ENCOUNTER — Other Ambulatory Visit: Payer: Self-pay | Admitting: Radiation Oncology

## 2020-03-21 DIAGNOSIS — Z510 Encounter for antineoplastic radiation therapy: Secondary | ICD-10-CM | POA: Diagnosis not present

## 2020-03-21 DIAGNOSIS — C61 Malignant neoplasm of prostate: Secondary | ICD-10-CM | POA: Diagnosis not present

## 2020-03-22 ENCOUNTER — Ambulatory Visit
Admission: RE | Admit: 2020-03-22 | Discharge: 2020-03-22 | Disposition: A | Discharge status: Home / Self Care | Payer: Medicare Other | Source: Ambulatory Visit | Attending: Radiation Oncology | Admitting: Radiation Oncology

## 2020-03-22 DIAGNOSIS — C61 Malignant neoplasm of prostate: Secondary | ICD-10-CM | POA: Diagnosis not present

## 2020-03-22 DIAGNOSIS — Z510 Encounter for antineoplastic radiation therapy: Secondary | ICD-10-CM | POA: Diagnosis not present

## 2020-03-25 ENCOUNTER — Ambulatory Visit: Payer: Medicare Other

## 2020-03-26 ENCOUNTER — Ambulatory Visit: Payer: Medicare Other

## 2020-03-27 ENCOUNTER — Other Ambulatory Visit: Payer: Self-pay

## 2020-03-27 ENCOUNTER — Ambulatory Visit
Admission: RE | Admit: 2020-03-27 | Discharge: 2020-03-27 | Disposition: A | Discharge status: Home / Self Care | Payer: Medicare Other | Source: Ambulatory Visit | Attending: Radiation Oncology | Admitting: Radiation Oncology

## 2020-03-27 DIAGNOSIS — Z510 Encounter for antineoplastic radiation therapy: Secondary | ICD-10-CM | POA: Diagnosis not present

## 2020-03-27 DIAGNOSIS — C61 Malignant neoplasm of prostate: Secondary | ICD-10-CM | POA: Diagnosis not present

## 2020-03-28 ENCOUNTER — Ambulatory Visit
Admission: RE | Admit: 2020-03-28 | Discharge: 2020-03-28 | Disposition: A | Discharge status: Home / Self Care | Payer: Medicare Other | Source: Ambulatory Visit | Attending: Radiation Oncology | Admitting: Radiation Oncology

## 2020-03-28 ENCOUNTER — Other Ambulatory Visit: Payer: Self-pay

## 2020-03-28 DIAGNOSIS — Z510 Encounter for antineoplastic radiation therapy: Secondary | ICD-10-CM | POA: Diagnosis not present

## 2020-03-28 DIAGNOSIS — C61 Malignant neoplasm of prostate: Secondary | ICD-10-CM | POA: Diagnosis not present

## 2020-03-29 ENCOUNTER — Ambulatory Visit
Admission: RE | Admit: 2020-03-29 | Discharge: 2020-03-29 | Disposition: A | Discharge status: Home / Self Care | Payer: Medicare Other | Source: Ambulatory Visit | Attending: Radiation Oncology | Admitting: Radiation Oncology

## 2020-03-29 ENCOUNTER — Other Ambulatory Visit: Payer: Self-pay

## 2020-03-29 DIAGNOSIS — Z510 Encounter for antineoplastic radiation therapy: Secondary | ICD-10-CM | POA: Diagnosis not present

## 2020-03-29 DIAGNOSIS — C61 Malignant neoplasm of prostate: Secondary | ICD-10-CM | POA: Diagnosis not present

## 2020-04-01 ENCOUNTER — Ambulatory Visit
Admission: RE | Admit: 2020-04-01 | Discharge: 2020-04-01 | Disposition: A | Discharge status: Home / Self Care | Payer: Medicare Other | Source: Ambulatory Visit | Attending: Radiation Oncology | Admitting: Radiation Oncology

## 2020-04-01 ENCOUNTER — Other Ambulatory Visit: Payer: Self-pay

## 2020-04-01 DIAGNOSIS — C61 Malignant neoplasm of prostate: Secondary | ICD-10-CM | POA: Diagnosis not present

## 2020-04-01 DIAGNOSIS — Z510 Encounter for antineoplastic radiation therapy: Secondary | ICD-10-CM | POA: Diagnosis not present

## 2020-04-02 ENCOUNTER — Ambulatory Visit
Admission: RE | Admit: 2020-04-02 | Discharge: 2020-04-02 | Disposition: A | Discharge status: Home / Self Care | Payer: Medicare Other | Source: Ambulatory Visit | Attending: Radiation Oncology | Admitting: Radiation Oncology

## 2020-04-02 DIAGNOSIS — Z510 Encounter for antineoplastic radiation therapy: Secondary | ICD-10-CM | POA: Diagnosis not present

## 2020-04-02 DIAGNOSIS — C61 Malignant neoplasm of prostate: Secondary | ICD-10-CM | POA: Diagnosis not present

## 2020-04-03 ENCOUNTER — Other Ambulatory Visit: Payer: Self-pay

## 2020-04-03 ENCOUNTER — Ambulatory Visit
Admission: RE | Admit: 2020-04-03 | Discharge: 2020-04-03 | Disposition: A | Discharge status: Home / Self Care | Payer: Medicare Other | Source: Ambulatory Visit | Attending: Radiation Oncology | Admitting: Radiation Oncology

## 2020-04-03 DIAGNOSIS — Z510 Encounter for antineoplastic radiation therapy: Secondary | ICD-10-CM | POA: Diagnosis not present

## 2020-04-03 DIAGNOSIS — C61 Malignant neoplasm of prostate: Secondary | ICD-10-CM | POA: Diagnosis not present

## 2020-04-04 ENCOUNTER — Ambulatory Visit
Admission: RE | Admit: 2020-04-04 | Discharge: 2020-04-04 | Disposition: A | Discharge status: Home / Self Care | Payer: Medicare Other | Source: Ambulatory Visit | Attending: Radiation Oncology | Admitting: Radiation Oncology

## 2020-04-04 ENCOUNTER — Other Ambulatory Visit: Payer: Self-pay

## 2020-04-04 DIAGNOSIS — C61 Malignant neoplasm of prostate: Secondary | ICD-10-CM | POA: Diagnosis not present

## 2020-04-04 DIAGNOSIS — Z510 Encounter for antineoplastic radiation therapy: Secondary | ICD-10-CM | POA: Diagnosis not present

## 2020-04-05 ENCOUNTER — Ambulatory Visit
Admission: RE | Admit: 2020-04-05 | Discharge: 2020-04-05 | Disposition: A | Discharge status: Home / Self Care | Payer: Medicare Other | Source: Ambulatory Visit | Attending: Radiation Oncology | Admitting: Radiation Oncology

## 2020-04-05 DIAGNOSIS — D6869 Other thrombophilia: Secondary | ICD-10-CM | POA: Diagnosis not present

## 2020-04-05 DIAGNOSIS — E785 Hyperlipidemia, unspecified: Secondary | ICD-10-CM | POA: Diagnosis not present

## 2020-04-05 DIAGNOSIS — M461 Sacroiliitis, not elsewhere classified: Secondary | ICD-10-CM | POA: Diagnosis not present

## 2020-04-05 DIAGNOSIS — K5730 Diverticulosis of large intestine without perforation or abscess without bleeding: Secondary | ICD-10-CM | POA: Diagnosis not present

## 2020-04-05 DIAGNOSIS — Z1389 Encounter for screening for other disorder: Secondary | ICD-10-CM | POA: Diagnosis not present

## 2020-04-05 DIAGNOSIS — I4891 Unspecified atrial fibrillation: Secondary | ICD-10-CM | POA: Diagnosis not present

## 2020-04-05 DIAGNOSIS — D123 Benign neoplasm of transverse colon: Secondary | ICD-10-CM | POA: Diagnosis not present

## 2020-04-05 DIAGNOSIS — Z0000 Encounter for general adult medical examination without abnormal findings: Secondary | ICD-10-CM | POA: Diagnosis not present

## 2020-04-05 DIAGNOSIS — K219 Gastro-esophageal reflux disease without esophagitis: Secondary | ICD-10-CM | POA: Diagnosis not present

## 2020-04-05 DIAGNOSIS — Z950 Presence of cardiac pacemaker: Secondary | ICD-10-CM | POA: Diagnosis not present

## 2020-04-05 DIAGNOSIS — K317 Polyp of stomach and duodenum: Secondary | ICD-10-CM | POA: Diagnosis not present

## 2020-04-05 DIAGNOSIS — Z79899 Other long term (current) drug therapy: Secondary | ICD-10-CM | POA: Diagnosis not present

## 2020-04-05 DIAGNOSIS — Z510 Encounter for antineoplastic radiation therapy: Secondary | ICD-10-CM | POA: Diagnosis not present

## 2020-04-05 DIAGNOSIS — E559 Vitamin D deficiency, unspecified: Secondary | ICD-10-CM | POA: Diagnosis not present

## 2020-04-05 DIAGNOSIS — C61 Malignant neoplasm of prostate: Secondary | ICD-10-CM | POA: Diagnosis not present

## 2020-04-08 ENCOUNTER — Ambulatory Visit
Admission: RE | Admit: 2020-04-08 | Discharge: 2020-04-08 | Disposition: A | Discharge status: Home / Self Care | Payer: Medicare Other | Source: Ambulatory Visit | Attending: Radiation Oncology | Admitting: Radiation Oncology

## 2020-04-08 ENCOUNTER — Other Ambulatory Visit: Payer: Self-pay | Admitting: Radiation Oncology

## 2020-04-08 ENCOUNTER — Telehealth: Payer: Self-pay | Admitting: Internal Medicine

## 2020-04-08 ENCOUNTER — Telehealth: Payer: Self-pay | Admitting: Radiation Oncology

## 2020-04-08 DIAGNOSIS — C61 Malignant neoplasm of prostate: Secondary | ICD-10-CM | POA: Diagnosis not present

## 2020-04-08 DIAGNOSIS — Z510 Encounter for antineoplastic radiation therapy: Secondary | ICD-10-CM | POA: Diagnosis not present

## 2020-04-08 MED ORDER — ONDANSETRON HCL 8 MG PO TABS: 8.0000 mg | ORAL_TABLET | Freq: Three times a day (TID) | ORAL | 0 refills | Status: DC | PRN

## 2020-04-08 NOTE — Telephone Encounter (Signed)
LMOM per DPR to send transmission. Device Clinic  # and hours to notify clinic that transmission was sent.

## 2020-04-08 NOTE — Telephone Encounter (Signed)
Pt called about scheduling appt with Dr. Rayann Heman.   He said that he has recently been diagnosed with cancer and has been having some SOB. His MD told him to reach out to Dr. Rayann Heman to see if his SOB could have something to do with his PPM.   Pt scheduled 2/18 with Dr. Rayann Heman.

## 2020-04-08 NOTE — Telephone Encounter (Signed)
-----   Message from Hayden Pedro, [place] sent at 04/08/2020  9:12 AM EST ----- Regarding: RE: Nausea Could be due to his radiation but agree with your triage to make sure he doesn't have symptoms of pyelonephrosis. I can send in some Zofran for him to try if you'll let him know he can take it every 8 hours if needed but can cause constipation!? Thanks, Bryson Ha  ----- Message ----- From: Heywood Footman, RN Sent: 04/08/2020   8:43 AM EST To: Tyler Pita, MD, Freeman Caldron, PA-C, # Subject: Nausea                                         Patient presented to the clinic requesting to see this RN. Patient reports nausea following treatment on Friday that persisted through the weekend. Patient explains he vomited this morning after eating two spoonfuls of granola. Patient confirms this was the only occasion of emesis. Patient denies dysuria, hematuria, urinary hesitancy, leakage or straining. Patient reports nocturia x 2-3, urinary frequency and urgency. Patient confirms he had a small bowel movement this morning.   Patient has received 12/25 radiation treatments to the pelvis_prostate region. Patient's preferred pharmacy is [facility] on [address].   Patient understand this RN will phone him with directions once she consults with Dr. Tammi Klippel.

## 2020-04-08 NOTE — Telephone Encounter (Signed)
Phoned patient. Explained that Zofran has been electronically sent to his preferred pharmacy. Instructed patient to take medication every 8 hours as needed for nausea. Explained it is ideal to take this medication 30 minutes prior to radiation especially if he is experiencing nausea within two hours s/p xrt. Explained this medication can cause constipation. Discussed use of Miralax if needed for constipation. Encouraged a BRAT diet until nausea is managed. Answered all patient questions to the best of my ability. Patient verbalized understanding of all reviewed.

## 2020-04-08 NOTE — Progress Notes (Signed)
Patient presented to the clinic requesting to see this RN. Patient reports nausea following treatment on Friday that persisted through the weekend. Patient explains he vomited this morning after eating two spoonfuls of granola. Patient confirms this was the only occasion of emesis. Patient denies dysuria, hematuria, urinary hesitancy, leakage or straining. Patient reports nocturia x 2-3, urinary frequency and urgency. Patient confirms he had a small bowel movement this morning.   Patient has received 12/25 radiation treatments to the pelvis_prostate region. Patient's preferred pharmacy is [facility] on [address].   Patient understand this RN will phone him with directions once she consults with Dr. Tammi Klippel.

## 2020-04-09 ENCOUNTER — Emergency Department (HOSPITAL_COMMUNITY): Payer: Medicare Other

## 2020-04-09 ENCOUNTER — Emergency Department (HOSPITAL_COMMUNITY)
Admission: EM | Admit: 2020-04-09 | Discharge: 2020-04-09 | Disposition: A | Discharge status: Home / Self Care | Payer: Medicare Other | Attending: Emergency Medicine | Admitting: Emergency Medicine

## 2020-04-09 ENCOUNTER — Encounter: Payer: Self-pay | Admitting: Radiation Oncology

## 2020-04-09 ENCOUNTER — Encounter (HOSPITAL_COMMUNITY): Payer: Self-pay

## 2020-04-09 ENCOUNTER — Other Ambulatory Visit: Payer: Self-pay | Admitting: Radiation Oncology

## 2020-04-09 ENCOUNTER — Ambulatory Visit: Admission: RE | Admit: 2020-04-09 | Payer: Medicare Other | Source: Ambulatory Visit

## 2020-04-09 DIAGNOSIS — Z87891 Personal history of nicotine dependence: Secondary | ICD-10-CM | POA: Insufficient documentation

## 2020-04-09 DIAGNOSIS — E860 Dehydration: Secondary | ICD-10-CM | POA: Diagnosis not present

## 2020-04-09 DIAGNOSIS — K529 Noninfective gastroenteritis and colitis, unspecified: Secondary | ICD-10-CM | POA: Diagnosis not present

## 2020-04-09 DIAGNOSIS — K219 Gastro-esophageal reflux disease without esophagitis: Secondary | ICD-10-CM | POA: Diagnosis not present

## 2020-04-09 DIAGNOSIS — C61 Malignant neoplasm of prostate: Secondary | ICD-10-CM | POA: Insufficient documentation

## 2020-04-09 DIAGNOSIS — Z510 Encounter for antineoplastic radiation therapy: Secondary | ICD-10-CM | POA: Insufficient documentation

## 2020-04-09 DIAGNOSIS — Z950 Presence of cardiac pacemaker: Secondary | ICD-10-CM | POA: Insufficient documentation

## 2020-04-09 DIAGNOSIS — I4819 Other persistent atrial fibrillation: Secondary | ICD-10-CM | POA: Insufficient documentation

## 2020-04-09 DIAGNOSIS — Z20822 Contact with and (suspected) exposure to covid-19: Secondary | ICD-10-CM | POA: Insufficient documentation

## 2020-04-09 DIAGNOSIS — K760 Fatty (change of) liver, not elsewhere classified: Secondary | ICD-10-CM | POA: Diagnosis not present

## 2020-04-09 DIAGNOSIS — R1010 Upper abdominal pain, unspecified: Secondary | ICD-10-CM | POA: Diagnosis present

## 2020-04-09 DIAGNOSIS — Z7901 Long term (current) use of anticoagulants: Secondary | ICD-10-CM | POA: Insufficient documentation

## 2020-04-09 DIAGNOSIS — R109 Unspecified abdominal pain: Secondary | ICD-10-CM | POA: Diagnosis not present

## 2020-04-09 LAB — COMPREHENSIVE METABOLIC PANEL
ALT: 21 U/L (ref 0–44)
AST: 24 U/L (ref 15–41)
Albumin: 4.1 g/dL (ref 3.5–5.0)
Alkaline Phosphatase: 76 U/L (ref 38–126)
Anion gap: 10 (ref 5–15)
BUN: 19 mg/dL (ref 8–23)
CO2: 26 mmol/L (ref 22–32)
Calcium: 10.7 mg/dL — ABNORMAL HIGH (ref 8.9–10.3)
Chloride: 95 mmol/L — ABNORMAL LOW (ref 98–111)
Creatinine, Ser: 1.11 mg/dL (ref 0.61–1.24)
GFR, Estimated: >60 mL/min (ref >60)
Glucose, Bld: 115 mg/dL — ABNORMAL HIGH (ref 70–99)
Potassium: 4.3 mmol/L (ref 3.5–5.1)
Sodium: 131 mmol/L — ABNORMAL LOW (ref 135–145)
Total Bilirubin: 2.5 mg/dL — ABNORMAL HIGH (ref 0.3–1.2)
Total Protein: 7 g/dL (ref 6.5–8.1)

## 2020-04-09 LAB — CBC WITH DIFFERENTIAL/PLATELET
Abs Immature Granulocytes: 0.02 10*3/uL (ref 0.00–0.07)
Basophils Absolute: 0 10*3/uL (ref 0.0–0.1)
Basophils Relative: 0 %
Eosinophils Absolute: 0.1 10*3/uL (ref 0.0–0.5)
Eosinophils Relative: 1 %
HCT: 39.2 % (ref 39.0–52.0)
Hemoglobin: 13.1 g/dL (ref 13.0–17.0)
Immature Granulocytes: 0 %
Lymphocytes Relative: 7 %
Lymphs Abs: 0.3 10*3/uL — ABNORMAL LOW (ref 0.7–4.0)
MCH: 31 pg (ref 26.0–34.0)
MCHC: 33.4 g/dL (ref 30.0–36.0)
MCV: 92.7 fL (ref 80.0–100.0)
Monocytes Absolute: 1 10*3/uL (ref 0.1–1.0)
Monocytes Relative: 19 %
Neutro Abs: 3.5 10*3/uL (ref 1.7–7.7)
Neutrophils Relative %: 73 %
Platelets: 153 10*3/uL (ref 150–400)
RBC: 4.23 MIL/uL (ref 4.22–5.81)
RDW: 12.9 % (ref 11.5–15.5)
WBC: 4.9 10*3/uL (ref 4.0–10.5)
nRBC: 0 % (ref 0.0–0.2)

## 2020-04-09 LAB — URINALYSIS, ROUTINE W REFLEX MICROSCOPIC
Bilirubin Urine: NEGATIVE
Glucose, UA: NEGATIVE mg/dL
Hgb urine dipstick: NEGATIVE
Ketones, ur: 5 mg/dL — AB
Leukocytes,Ua: NEGATIVE
Nitrite: NEGATIVE
Protein, ur: NEGATIVE mg/dL
Specific Gravity, Urine: 1.025 (ref 1.005–1.030)
pH: 5 (ref 5.0–8.0)

## 2020-04-09 LAB — PROTIME-INR
INR: 1.3 — ABNORMAL HIGH (ref 0.8–1.2)
Prothrombin Time: 16.1 seconds — ABNORMAL HIGH (ref 11.4–15.2)

## 2020-04-09 LAB — LACTIC ACID, PLASMA: Lactic Acid, Venous: 0.9 mmol/L (ref 0.5–1.9)

## 2020-04-09 LAB — SARS CORONAVIRUS 2 BY RT PCR (HOSPITAL ORDER, PERFORMED IN ~~LOC~~ HOSPITAL LAB): SARS Coronavirus 2: NEGATIVE

## 2020-04-09 LAB — LIPASE, BLOOD: Lipase: 25 U/L (ref 11–51)

## 2020-04-09 IMAGING — CT CT ABD-PELV W/ CM
2 of 5 series · 15 of 46 positions shown, 17 images · IV contrast (OMNIPAQUE 300)
Comparison: [DATE]

CLINICAL DATA: Prostate cancer, abdominal pain, abdominal cramping

EXAM:
CT ABDOMEN AND PELVIS WITH CONTRAST
TECHNIQUE: Multidetector CT imaging of the abdomen and pelvis was performed
using the standard protocol following bolus administration of
intravenous contrast.
CONTRAST:  100mL OMNIPAQUE IOHEXOL 300 MG/ML  SOLN

[Series 2: axial st · axial · 0.88mm/px · z∈[-634,-190]mm · 12 of 107 slices shown, 14 images]
[im 9/107  soft-tissue]
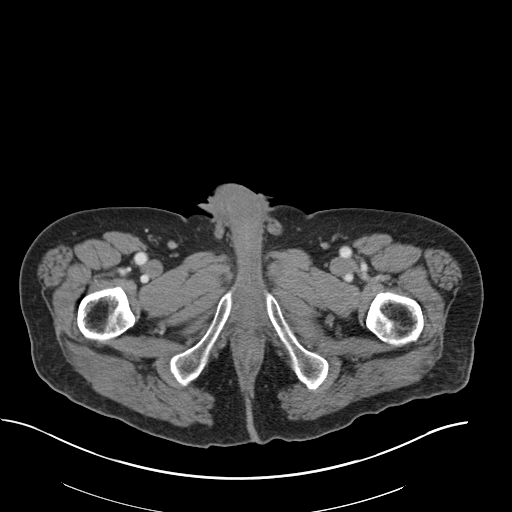
[im 9/107  bone]
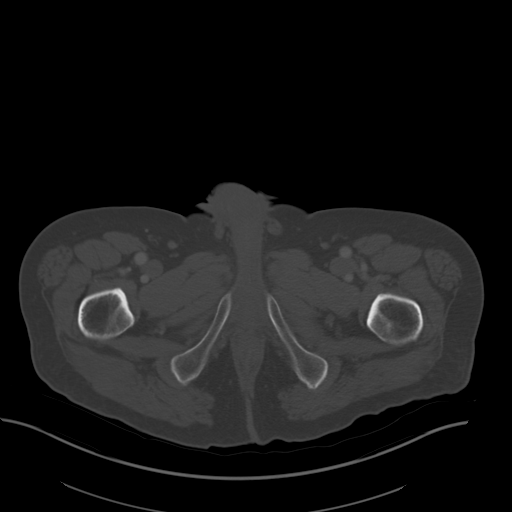
[im 17/107  soft-tissue]
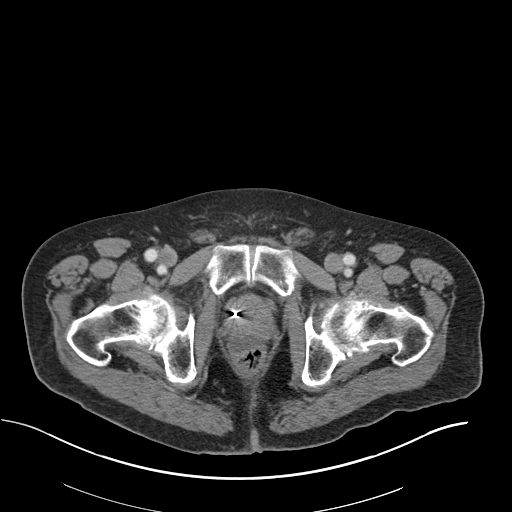
[im 25/107  soft-tissue]
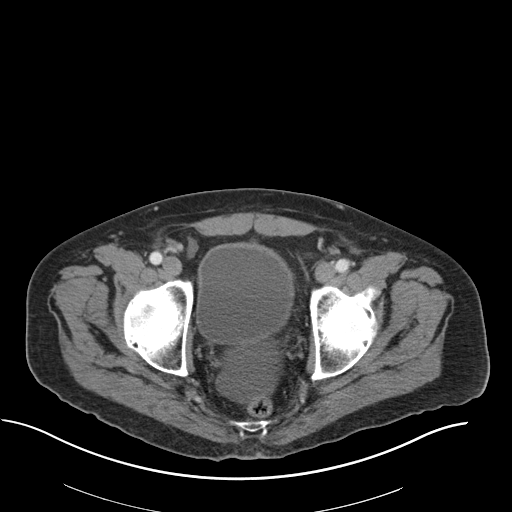
[im 33/107  soft-tissue]
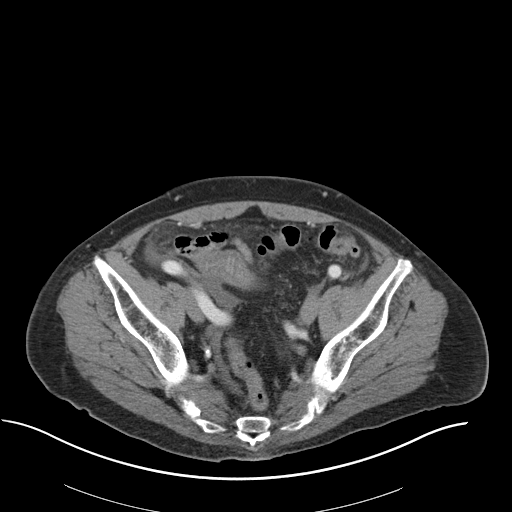
[im 41/107  soft-tissue]
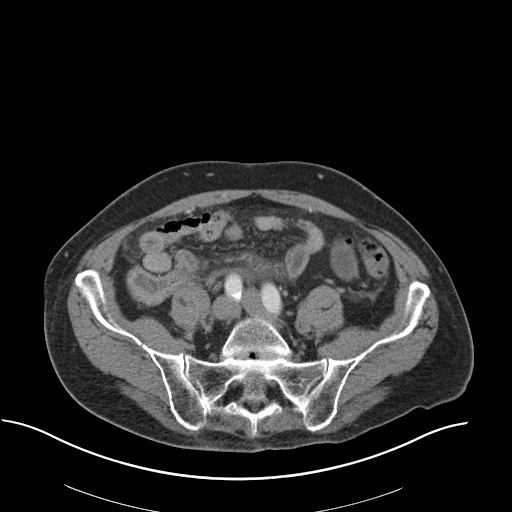
[im 49/107  soft-tissue]
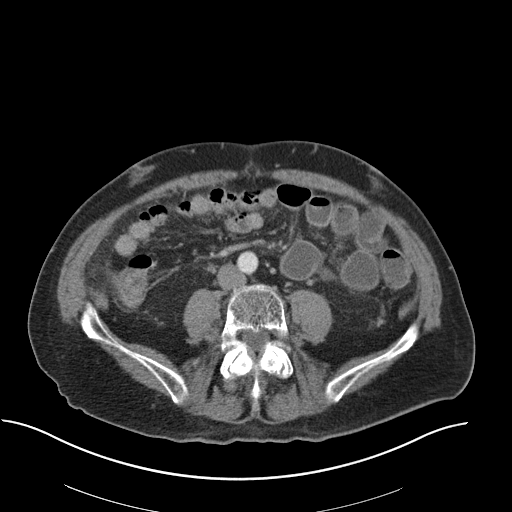
[im 58/107  soft-tissue]
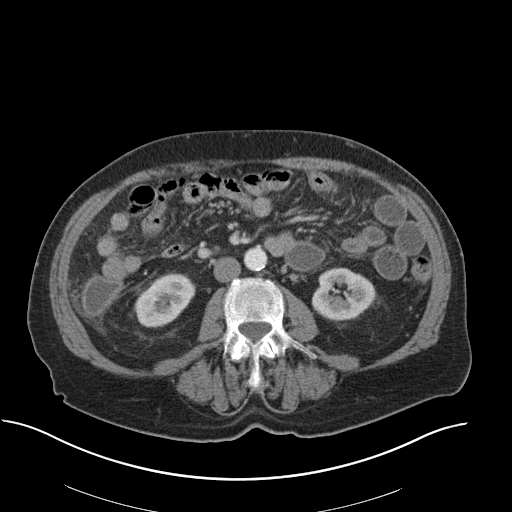
[im 66/107  soft-tissue]
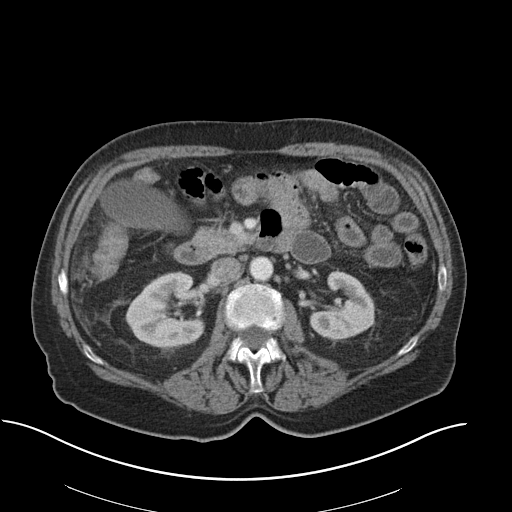
[im 74/107  soft-tissue]
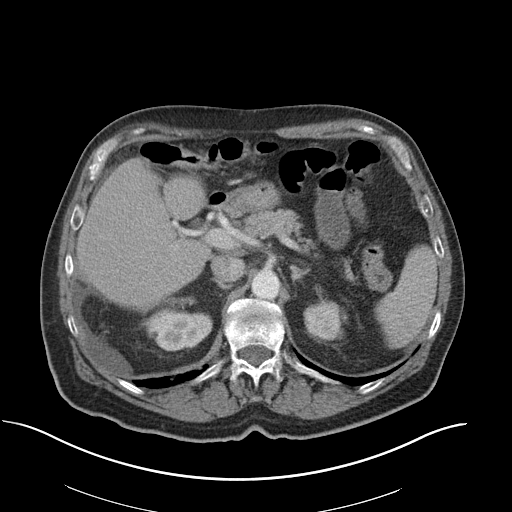
[im 74/107  bone]
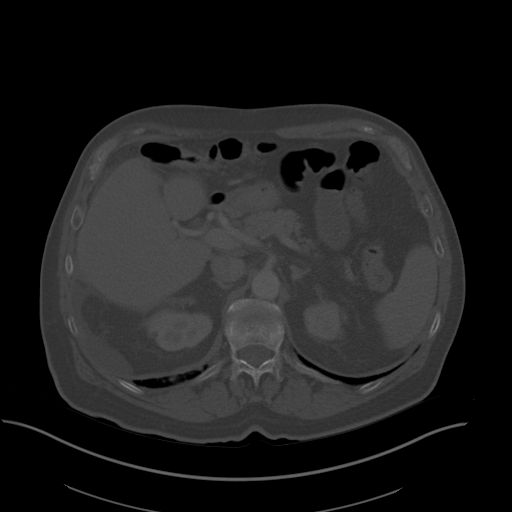
[im 82/107  soft-tissue]
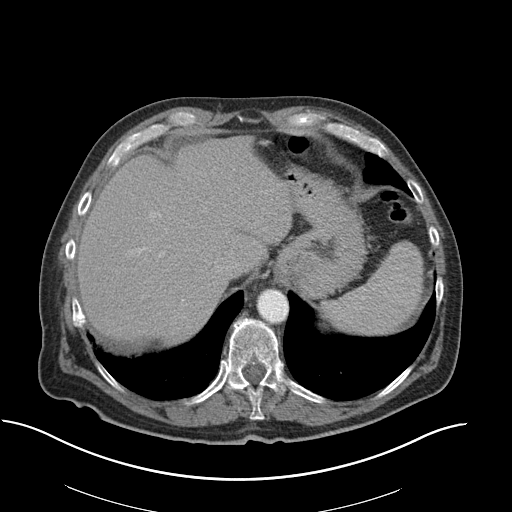
[im 90/107  soft-tissue]
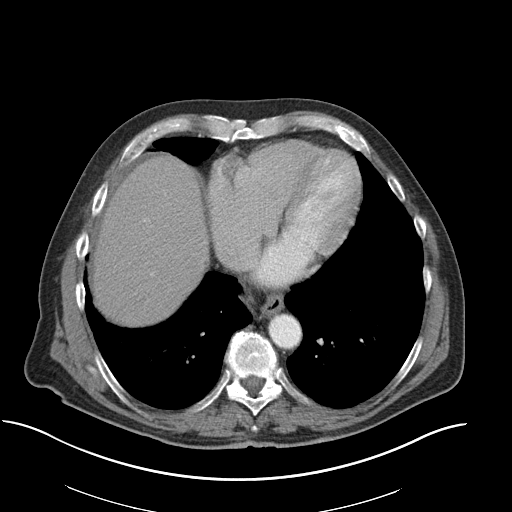
[im 98/107  soft-tissue]
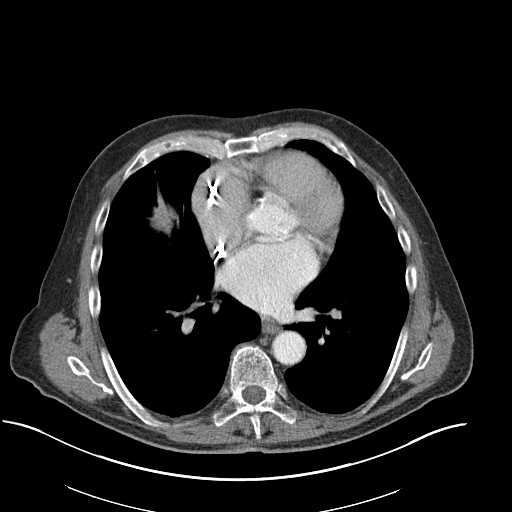

[Series 4: coronal st · coronal · 1.06mm/px · 3 of 148 slices shown]
[im 50/148  soft-tissue]
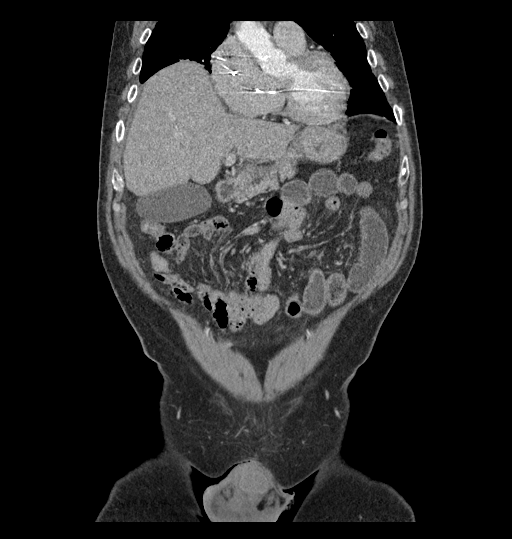
[im 66/148  soft-tissue]
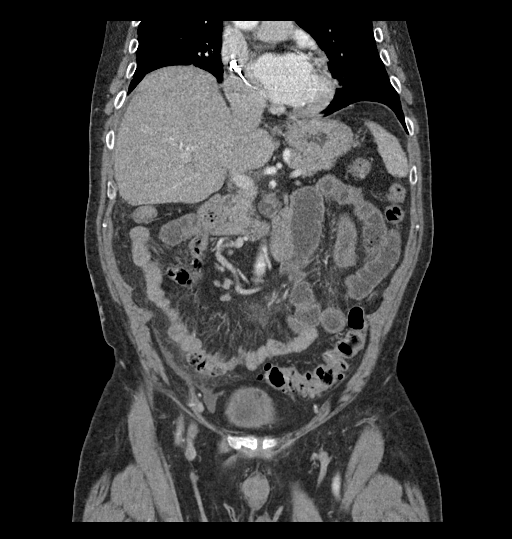
[im 82/148  soft-tissue]
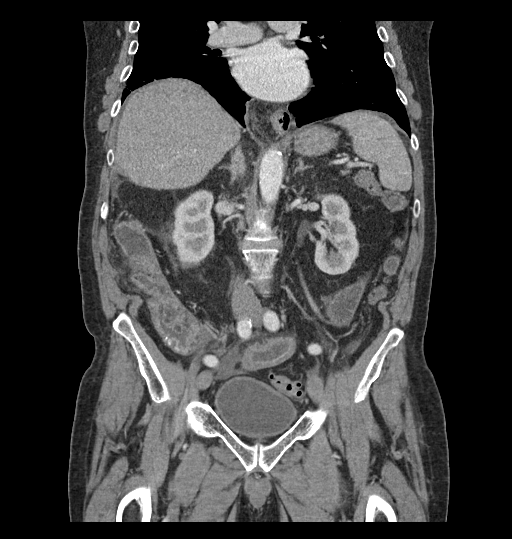

[15 of 46 positions shown; findings below may reference images not displayed]

FINDINGS: Lower chest: Mild right basilar atelectasis. Stable 3 mm
noncalcified pulmonary nodule within the left lower lobe, axial
image # 38/6. Moderate coronary artery calcification. Global cardiac
size within normal limits. Pacemaker leads seen within the right
atrium and right ventricle. No pericardial effusion.

Hepatobiliary: Mild hepatic steatosis. No focal enhancing
intrahepatic mass. No intra or extrahepatic biliary ductal dilation.
Gallbladder unremarkable.

Pancreas: Unremarkable

Spleen: Stable 13 mm enhancing nodule within the upper pole of the
spleen, likely representing a flash filled hemangioma given its
stability over time.

Adrenals/Urinary Tract: Adrenal glands are unremarkable. Kidneys are
normal, without renal calculi, focal lesion, or hydronephrosis.
Bladder is unremarkable.

Stomach/Bowel: There is interval development of a marked
circumferential bowel wall thickening, mucosal hyperemia, and
surrounding mesenteric edema involving the terminal 20 cm of the
terminal ileum in keeping with an infectious or inflammatory
terminal ileitis. Milder inflammatory changes are seen involving the
cecum. Together, the findings can be seen the setting of
inflammatory bowel disease or infectious etiologies such as
Shigella, salmonella Campylobacter, or Yersinia enterocolitis. There
is mild ascites present. No loculated intra-abdominal fluid
collections are seen. There is no free intraperitoneal gas. No
evidence of obstruction.

Severe sigmoid diverticulosis is present without superimposed
inflammatory change. The stomach, small bowel, and large bowel are
otherwise unremarkable.

Vascular/Lymphatic: Mild aortoiliac atherosclerotic calcification.
No aortic aneurysm. No pathologic adenopathy within the abdomen and
pelvis.

Reproductive: Brachytherapy seeds are seen within the prostate
gland. Seminal vesicles are unremarkable.

Other: Tiny fat containing umbilical hernia. The rectum is
unremarkable.

Musculoskeletal: No acute bone abnormality. No lytic or blastic bone
lesions are seen. Degenerative changes are seen within the lumbar
spine.
IMPRESSION: Infectious or inflammatory terminal ileitis with milder inflammatory
changes involving the cecum. No evidence of obstruction or
perforation. Mild mesenteric edema and ascites noted.

Severe sigmoid diverticulosis without evidence of diverticulitis.

Stable 13 mm enhancing lesion within the spleen most in keeping with
a flash filled hemangioma.

Mild hepatic steatosis.

Aortic Atherosclerosis ([JK]-[JK]).

## 2020-04-09 MED ORDER — OXYCODONE-ACETAMINOPHEN 5-325 MG PO TABS: 1.0000 | ORAL_TABLET | ORAL | 0 refills | Status: DC | PRN

## 2020-04-09 MED ORDER — METRONIDAZOLE IN NACL 5-0.79 MG/ML-% IV SOLN
500.0000 mg | Freq: Once | INTRAVENOUS | Status: AC
  Administered 2020-04-09: 500 mg via INTRAVENOUS
  Filled 2020-04-09: qty 100

## 2020-04-09 MED ORDER — LACTATED RINGERS IV SOLN: INTRAVENOUS | Status: DC

## 2020-04-09 MED ORDER — ONDANSETRON HCL 4 MG/2ML IJ SOLN: 4.0000 mg | INTRAMUSCULAR | Status: DC | PRN

## 2020-04-09 MED ORDER — METRONIDAZOLE 500 MG PO TABS: 500.0000 mg | ORAL_TABLET | Freq: Two times a day (BID) | ORAL | 0 refills | Status: DC

## 2020-04-09 MED ORDER — PANTOPRAZOLE SODIUM 40 MG IV SOLR
40.0000 mg | Freq: Once | INTRAVENOUS | Status: AC
  Administered 2020-04-09: 40 mg via INTRAVENOUS
  Filled 2020-04-09: qty 40

## 2020-04-09 MED ORDER — ONDANSETRON 4 MG PO TBDP: 4.0000 mg | ORAL_TABLET | ORAL | 0 refills | Status: DC | PRN

## 2020-04-09 MED ORDER — LEVOFLOXACIN 750 MG PO TABS: 750.0000 mg | ORAL_TABLET | Freq: Every day | ORAL | 0 refills | Status: DC

## 2020-04-09 MED ORDER — LEVOFLOXACIN IN D5W 750 MG/150ML IV SOLN
750.0000 mg | Freq: Once | INTRAVENOUS | Status: AC
  Administered 2020-04-09: 750 mg via INTRAVENOUS
  Filled 2020-04-09: qty 150

## 2020-04-09 MED ORDER — IOHEXOL 300 MG/ML  SOLN
100.0000 mL | Freq: Once | INTRAMUSCULAR | Status: AC | PRN
  Administered 2020-04-09: 100 mL via INTRAVENOUS

## 2020-04-09 MED ORDER — MORPHINE SULFATE (PF) 4 MG/ML IV SOLN: 4.0000 mg | INTRAVENOUS | Status: DC | PRN

## 2020-04-09 MED ORDER — LACTATED RINGERS IV BOLUS
1000.0000 mL | Freq: Once | INTRAVENOUS | Status: AC
  Administered 2020-04-09: 1000 mL via INTRAVENOUS

## 2020-04-09 MED ORDER — LACTATED RINGERS IV BOLUS
500.0000 mL | Freq: Once | INTRAVENOUS | Status: AC
  Administered 2020-04-09: 500 mL via INTRAVENOUS

## 2020-04-09 NOTE — ED Triage Notes (Signed)
Being treated for prostate cancer here at cancer center. Patient presents today with abdominal cramping and recently had a CT scan that showed diverticulosis. Provider sent patient over to rule out diverticulitis.

## 2020-04-09 NOTE — ED Notes (Signed)
Patient refusing pain medication at this time; states that pain is 0/10.

## 2020-04-09 NOTE — ED Provider Notes (Signed)
[facility] DEPT Provider Note   CSN: [id number] Arrival date & time: 04/09/20  0917     History Chief Complaint  Patient presents with  . Abdominal Pain    Todd Chapman is a 75 y.o. male.  HPI Patient is undergoing radiation therapy for prostate cancer.  He reports he started to the some abdominal pain about 4 days ago.  It was not that severe.  He did have some loss of appetite and has not been eating much.  He stopped eating about 24 hours ago.  He also has not had much to drink.  He is already getting really intense cramping and sharp central and upper abdominal pain during the night in the morning.  His wife reports that had him doubled up and on the floor.  He reports that went on for a while but then he had a very large diarrheal bowel movement and the pain started to abate quite a bit after that.  He still has discomfort but the pain is nowhere near what it was.  He did not have any fever documented.  Patient was sent over from radiation oncology for concerns of a possible ruptured diverticulitis based on prior diagnosis of diverticulosis and pain.    Past Medical History:  Diagnosis Date  . Arthritis    "thumbs, back" (02/16/2017)  . Bradycardia 02/2017  . Dyspnea    with exertion  . Flu 2 weeks ago  . GERD (gastroesophageal reflux disease)   . Hepatitis A as child  . HLD (hyperlipidemia)   . Persistent atrial fibrillation ([place]) 12/18/2016  . Pneumonia 1999; 2018  . 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 Prostate cancer [facility])     Patient Active Problem List   Diagnosis Date Noted  . Malignant neoplasm of prostate ([place]) 01/09/2020  . Annual physical exam 01/08/2020  . Acute upper respiratory infection 01/08/2020  . Asymptomatic varicose veins of lower extremity 01/08/2020  . Bronchitis 01/08/2020  . Cataract 01/08/2020  . Constipation 01/08/2020  . Diverticulosis of large intestine without perforation or abscess  without bleeding 01/08/2020  . Screening for gout 01/08/2020  . False positive serological test for syphilis 01/08/2020  . Gastro-esophageal reflux disease without esophagitis 01/08/2020  . 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 History of colonic polyps 01/08/2020  . Impacted cerumen 01/08/2020  . Insomnia 01/08/2020  . Osteoarthritis 01/08/2020  . Polypharmacy 01/08/2020  . Pure hypercholesterolemia 01/08/2020  . Rosacea 01/08/2020  . Combined forms of age-related cataract of both eyes 07/13/2019  . Elevated blood pressure reading 11/08/2018  . Sick sinus syndrome ([place]) 02/16/2017  . Persistent atrial fibrillation ([place]) 12/18/2016  . 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 Nonexudative age-related macular degeneration, bilateral, 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  . Dermatochalasis of both upper eyelids 07/25/2014  . Nuclear sclerosis of both eyes 07/25/2014  . Bradycardia 05/29/2014  . Premature atrial contractions 05/29/2014  . Premature junctional contractions ([place]) 05/29/2014  . Hyperlipidemia 05/29/2014  . Encounter for general adult medical examination with abnormal findings 04/24/2014  . Vitamin D deficiency 04/24/2014    Past Surgical History:  Procedure Laterality Date  . APPENDECTOMY     age 66  . CARDIOVERSION  02/16/2017  . CARDIOVERSION N/A 02/16/2017   Procedure: CARDIOVERSION;  Surgeon: Thompson Grayer, MD;  Location: [facility];  Service: Cardiovascular;  Laterality: N/A;  . COLONOSCOPY WITH PROPOFOL N/A 12/13/2012   Procedure: COLONOSCOPY WITH PROPOFOL;  Surgeon: Hassell Done  Sandria Senter, MD;  Location: [facility];  Service: Endoscopy;  Laterality: N/A;  . FRACTURE SURGERY    . HYDROCELE EXCISION Left 05/16/2018   Procedure: HYDROCELECTOMY ADULT;  Surgeon: [name], MD;  Location: [facility];  Service: Urology;  Laterality: Left;  . INSERT / REPLACE / REMOVE PACEMAKER  02/16/2017  . 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 PROSTATE BIOPSY  11/23/2019  . WRIST FRACTURE SURGERY Left    with pins       Family History  Problem Relation Age of Onset  . CVA Mother   . Coronary artery disease Mother   . Stroke Mother   . Coronary artery disease Father   . Heart attack Father   . Stomach cancer Maternal Grandmother   . Breast cancer Neg Hx   . Prostate cancer Neg Hx   . Colon cancer Neg Hx   . Pancreatic cancer Neg Hx     Social History   Tobacco Use  . Smoking status: Former Smoker    Packs/day: 0.12    Years: 8.00    Pack years: 0.96    Types: Cigarettes    Quit date: 11/24/1965    Years since quitting: 54.4  . Smokeless tobacco: Never Used  Vaping Use  . Vaping Use: Never used  Substance Use Topics  . Alcohol use: Yes    Comment: 02/16/2017 "might have 1 drink/month"  . Drug use: No    Home Medications Prior to Admission medications   Medication Sig Start Date End Date Taking? Authorizing Provider  levofloxacin (LEVAQUIN) 750 MG tablet Take 1 tablet (750 mg total) by mouth daily. X 7 days 04/09/20  Yes Abygayle Deltoro, Jeannie Done, MD  metroNIDAZOLE (FLAGYL) 500 MG tablet Take 1 tablet (500 mg total) by mouth 2 (two) times daily. One po bid x 7 days 04/09/20  Yes Marguarite Markov, Jeannie Done, MD  ondansetron (ZOFRAN ODT) 4 MG disintegrating tablet Take 1 tablet (4 mg total) by mouth every 4 (four) hours as needed for nausea or vomiting. 04/09/20  Yes Charlesetta Shanks, MD  oxyCODONE-acetaminophen (PERCOCET) 5-325 MG tablet Take 1-2 tablets by mouth every 4 (four) hours as needed. 04/09/20  Yes Charlesetta Shanks, MD  acetaminophen (TYLENOL) 500 MG tablet Take 500 mg by mouth daily as needed for mild pain.    [provider]  ELIQUIS 5 MG TABS tablet TAKE 1 TABLET(5 MG) BY MOUTH TWICE DAILY 03/04/20   Allred, Jeneen Rinks, MD  loratadine (CLARITIN) 10 MG tablet Take 10 mg by mouth daily as needed for allergies.    [provider]  meloxicam (MOBIC) 7.5 MG tablet SMARTSIG:1 Tablet(s) By Mouth 2-3 Times Weekly PRN Patient not taking: Reported on 01/09/2020 10/04/19   [provider]  Multiple Vitamin (MULTIVITAMIN WITH MINERALS) TABS tablet Take 1 tablet by mouth daily.    [provider]  Multiple Vitamins-Minerals (EYE VITAMINS) CAPS Take 1 capsule by mouth daily.     [provider]  mupirocin ointment (BACTROBAN) 2 % Place 1 application into the nose daily as needed (dry nose). Patient not taking: Reported on 01/09/2020    [provider]  ondansetron (ZOFRAN) 8 MG tablet Take 1 tablet (8 mg total) by mouth every 8 (eight) hours as needed for nausea or vomiting. 04/08/20   Hayden Pedro, PA-C  pantoprazole (PROTONIX) 40 MG tablet Take 40 mg daily by mouth. 10/16/16   [provider]  rosuvastatin (CRESTOR) 10 MG tablet Take 10 mg by mouth every morning.    [provider]    Allergies    Atorvastatin and Ezetimibe  Review of Systems   Review of Systems 10 systems reviewed and negative except as per HPI Physical Exam Updated Vital Signs BP 114/68 (BP Location: Right Arm)   Pulse 91   Temp 97.9 F (36.6 C) (Oral)   Resp 16   SpO2 98%   Physical Exam Constitutional:      Comments: Patient is alert.  No respiratory distress.  Well-nourished well-developed.  Eyes:     Extraocular Movements: Extraocular movements intact.  Cardiovascular:     Rate and Rhythm: Normal rate and regular rhythm.  Pulmonary:     Effort: Pulmonary effort is normal.     Breath sounds: Normal breath sounds.  Abdominal:     Comments: Abdomen soft.  Mild to moderate central discomfort to palpation without guarding.  Lower abdomen nontender.  Musculoskeletal:        General: No swelling or tenderness. Normal range of motion.     Right lower leg: No edema.     Left lower leg: No edema.  Skin:    General: Skin is warm and dry.  Neurological:     General: No  focal deficit present.     Mental Status: He is oriented to person, place, and time.     Coordination: Coordination normal.  Psychiatric:        Mood and Affect: Mood normal.     ED Results / Procedures / Treatments   Labs (all labs ordered are listed, but only abnormal results are displayed) Labs Reviewed  COMPREHENSIVE METABOLIC PANEL - Abnormal; Notable for the following components:      Result Value   Sodium 131 (*)    Chloride 95 (*)    Glucose, Bld 115 (*)    Calcium 10.7 (*)    Total Bilirubin 2.5 (*)    All other components within normal limits  CBC WITH DIFFERENTIAL/PLATELET - Abnormal; Notable for the following components:   Lymphs Abs 0.3 (*)    All other components within normal limits  PROTIME-INR - Abnormal; Notable for the following components:   Prothrombin Time 16.1 (*)    INR 1.3 (*)    All other components within normal limits  URINALYSIS, ROUTINE W REFLEX MICROSCOPIC - Abnormal; Notable for the following components:   Ketones, ur 5 (*)    All other components within normal limits  SARS CORONAVIRUS 2 BY RT PCR (HOSPITAL ORDER, [facility])  LIPASE, BLOOD  LACTIC ACID, PLASMA    EKG None  Radiology CT Abdomen Pelvis W Contrast  Result Date: 04/09/2020 CLINICAL DATA:  Prostate cancer, abdominal pain, abdominal cramping EXAM: CT ABDOMEN AND PELVIS WITH CONTRAST TECHNIQUE: Multidetector CT imaging of the abdomen and pelvis was performed using the standard protocol following bolus administration of intravenous contrast. CONTRAST:  122mL OMNIPAQUE IOHEXOL 300 MG/ML  SOLN COMPARISON:  12/07/2019 FINDINGS: Lower chest: Mild right basilar atelectasis. Stable 3 mm noncalcified pulmonary nodule within the left lower lobe, axial image # 38/6. Moderate coronary artery calcification. Global cardiac size within normal limits. Pacemaker leads seen within the right atrium and right ventricle. No pericardial effusion. Hepatobiliary: Mild hepatic  steatosis. No focal enhancing intrahepatic mass. No intra or extrahepatic biliary ductal dilation. Gallbladder unremarkable. Pancreas: Unremarkable Spleen: Stable 13 mm enhancing nodule within the upper pole of the spleen, likely representing a  flash filled hemangioma given its stability over time. Adrenals/Urinary Tract: Adrenal glands are unremarkable. Kidneys are normal, without renal calculi, focal lesion, or hydronephrosis. Bladder is unremarkable. Stomach/Bowel: There is interval development of a marked circumferential bowel wall thickening, mucosal hyperemia, and surrounding mesenteric edema involving the terminal 20 cm of the terminal ileum in keeping with an infectious or inflammatory terminal ileitis. Milder inflammatory changes are seen involving the cecum. Together, the findings can be seen the setting of inflammatory bowel disease or infectious etiologies such as Shigella, salmonella Campylobacter, or Yersinia enterocolitis. There is mild ascites present. No loculated intra-abdominal fluid collections are seen. There is no free intraperitoneal gas. No evidence of obstruction. Severe sigmoid diverticulosis is present without superimposed inflammatory change. The stomach, small bowel, and large bowel are otherwise unremarkable. Vascular/Lymphatic: Mild aortoiliac atherosclerotic calcification. No aortic aneurysm. No pathologic adenopathy within the abdomen and pelvis. Reproductive: Brachytherapy seeds are seen within the prostate gland. Seminal vesicles are unremarkable. Other: Tiny fat containing umbilical hernia. The rectum is unremarkable. Musculoskeletal: No acute bone abnormality. No lytic or blastic bone lesions are seen. Degenerative changes are seen within the lumbar spine. IMPRESSION: Infectious or inflammatory terminal ileitis with milder inflammatory changes involving the cecum. No evidence of obstruction or perforation. Mild mesenteric edema and ascites noted. Severe sigmoid diverticulosis  without evidence of diverticulitis. Stable 13 mm enhancing lesion within the spleen most in keeping with a flash filled hemangioma. Mild hepatic steatosis. Aortic Atherosclerosis (ICD10-I70.0). Electronically Signed   By: Fidela Salisbury MD   On: 04/09/2020 11:30    Procedures Procedures   Medications Ordered in ED Medications  lactated ringers infusion (0 mLs Intravenous Hold 04/09/20 1428)  morphine 4 MG/ML injection 4 mg (has no administration in time range)  ondansetron (ZOFRAN) injection 4 mg (has no administration in time range)  levofloxacin (LEVAQUIN) IVPB 750 mg (750 mg Intravenous New Bag/Given 04/09/20 1431)  lactated ringers bolus 500 mL (0 mLs Intravenous Stopped 04/09/20 1309)  pantoprazole (PROTONIX) injection 40 mg (40 mg Intravenous Given 04/09/20 1128)  iohexol (OMNIPAQUE) 300 MG/ML solution 100 mL (100 mLs Intravenous Contrast Given 04/09/20 1059)  metroNIDAZOLE (FLAGYL) IVPB 500 mg (0 mg Intravenous Stopped 04/09/20 1436)  lactated ringers bolus 1,000 mL (0 mLs Intravenous Paused 04/09/20 1428)    ED Course  I have reviewed the triage vital signs and the nursing notes.  Pertinent labs & imaging results that were available during my care of the patient were reviewed by me and considered in my medical decision making (see chart for details).    MDM Rules/Calculators/A&P                          Patient presents as outlined above.  He did develop significant abdominal pain over the past 24 hours and then went on to have copious diarrhea.  He has not had bowel movement since being in the emergency department.  CT shows circumferential ileitis and some inflammation of the cecum.  No diverticulitis or signs of perforation.  Patient's labs are normal.  No fever or abnormal vital signs.  At this time, patient would prefer to have a first dose of antibiotics in the emergency department and continue on outpatient basis.  At this time that is appropriate, with close follow-up and return  precautions reviewed.  Patient is aware he will need follow-up for resolution of symptoms and probable follow-up diagnostic testing including possible EGD\CT or MRI. Final Clinical Impression(s) / ED Diagnoses Final diagnoses:  Ileitis  Prostate cancer ([place])  Dehydration    Rx / DC Orders ED Discharge Orders         Ordered    levofloxacin (LEVAQUIN) 750 MG tablet  Daily        04/09/20 1434    metroNIDAZOLE (FLAGYL) 500 MG tablet  2 times daily        04/09/20 1434    ondansetron (ZOFRAN ODT) 4 MG disintegrating tablet  Every 4 hours PRN        04/09/20 1434    oxyCODONE-acetaminophen (PERCOCET) 5-325 MG tablet  Every 4 hours PRN        04/09/20 1434    Gastrointestinal Panel by PCR , Stool        04/09/20 1459    C Difficile Quick Screen w PCR reflex        04/09/20 1459           Charlesetta Shanks, MD 04/09/20 [phone]

## 2020-04-09 NOTE — Discharge Instructions (Addendum)
1.  You have inflammation of your small bowel.  This is often caused by an infection but also may be due to autoimmune problems or possibly cancer.  This is why is very important you get a follow-up with your family physician.  You will need reevaluation see if you are improving significant the treatment and determination if you need repeat CT scan and upper endoscopy. 2.  You have been given IV antibiotics.  Start your Flagyl tonight.  Start your Levaquin tomorrow. 3.  Return to the emergency department if you get a fever, nausea or vomiting and cannot tolerate your medication, worsening pain or other concerning symptoms. 4.  If you can, try to get a diarrheal stool specimen to return to the lab.

## 2020-04-09 NOTE — Progress Notes (Addendum)
  Radiation Oncology         [phone]) [phone] ________________________________  Name: Todd Chapman MRN: [id number]  Date: 04/09/2020  DOB: 01-27-1946  Chart Note:  Mr Ice has received 12 radiation treatments for prostate cancer.  He developed nausea vomiting and abdominal pain Friday with diarrhea yesterday.  He had diverticulosis on staging pelvic CT in Nov.  On XRT imaging yesterday he had fluid around the sigmoid colon suggesting diverticular rupture.  He is afebrile and he does have rebound tenderness on exam.  On yesterday's cone beam CT for radiation positioning, he had a new fluid collection around the sigmoid colon and anterior to the rectum behind the seminal vesicles suggestive of ruptured diverticulum/divertiulitis.  (Notably, this finding is superior to and distinct from the patient's SpaceOAR gel implant which is lower behind the prostate)     I notified the ED and will transfer the patient there for emergent evaluation for ruptured diverticulitis.  ________________________________  Sheral Apley Tammi Klippel, M.D.

## 2020-04-09 NOTE — Telephone Encounter (Signed)
LMOVM for patient to return our call.  

## 2020-04-09 NOTE — Progress Notes (Signed)
  Radiation Oncology         ([phone]) [phone] ________________________________  Name: Todd Chapman MRN: [id number]  Date: 04/09/2020  DOB: [birth year]  Telephone contact:  I reviewed the pelvic CT and ED physician note.  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    He has received 12 out of 25 radiation treatments to the pelvic lymph nodes and prostate.  This was to be followed by 15 'boost' treatments to the prostate only.    He is being treated with antibiotics.  The cause of the enteritis is not entirely clear, but, could reflect some component of radiation enteritis.    As a result, we will take two days off, and resume treatment, but, proceeding to the smaller radiation fields covering the prostate only, for the 'boost phase' of treatment for 15 treatments.  On completion of the boost, we'll attempt to complete the pelvic nodal irradiation requiring 13 more treatments in hopes of completing the planned treatment.  The patient is agreeable to this plan after a phone call to discuss the situation.  ________________________________  Sheral Apley Tammi Klippel, M.D.

## 2020-04-10 ENCOUNTER — Telehealth: Payer: Self-pay | Admitting: Radiation Oncology

## 2020-04-10 ENCOUNTER — Ambulatory Visit: Payer: Medicare Other

## 2020-04-10 NOTE — Progress Notes (Signed)
04/09/20 at [place], RT on L1 that this patient's radiation therapy is cancelled for today per Dr. Tammi Klippel. Understanding verbalized.

## 2020-04-10 NOTE — Progress Notes (Signed)
04/09/2020 at 0905. Listened as Dr. Tammi Klippel provided report to Dr. Johnney Killian over the telephone. Listened as Dr. Johnney Killian instructed Dr. Tammi Klippel to have the patient check into the emergency room. Requested Phebe Colla, NT wheel patient to emergency room to check in. Patient's wife followed as Phebe Colla, NT wheeled patient out of the clinic. Patient in stable condition as he was wheeled out of the radiation clinic.

## 2020-04-10 NOTE — Telephone Encounter (Signed)
Received voicemail message from Dr. Diona Fanti requesting Dr. Tammi Klippel evaluate this patient prior to radiation therapy today for concerning GI issues. Dr. Diona Fanti stated in his voicemail, "tell Dr. Tammi Klippel I did advise the patient to cancel his radiation therapy for today." Information obtained from voicemail relayed to Dr. Tammi Klippel.

## 2020-04-11 ENCOUNTER — Encounter: Payer: Self-pay | Admitting: Medical Oncology

## 2020-04-11 ENCOUNTER — Ambulatory Visit
Admission: RE | Admit: 2020-04-11 | Discharge: 2020-04-11 | Disposition: A | Discharge status: Home / Self Care | Payer: Medicare Other | Source: Ambulatory Visit | Attending: Radiation Oncology | Admitting: Radiation Oncology

## 2020-04-11 ENCOUNTER — Other Ambulatory Visit: Payer: Self-pay

## 2020-04-11 DIAGNOSIS — K529 Noninfective gastroenteritis and colitis, unspecified: Secondary | ICD-10-CM | POA: Diagnosis not present

## 2020-04-11 DIAGNOSIS — Z510 Encounter for antineoplastic radiation therapy: Secondary | ICD-10-CM | POA: Diagnosis not present

## 2020-04-11 DIAGNOSIS — C61 Malignant neoplasm of prostate: Secondary | ICD-10-CM | POA: Diagnosis not present

## 2020-04-12 ENCOUNTER — Ambulatory Visit
Admission: RE | Admit: 2020-04-12 | Discharge: 2020-04-12 | Disposition: A | Discharge status: Home / Self Care | Payer: Medicare Other | Source: Ambulatory Visit | Attending: Radiation Oncology | Admitting: Radiation Oncology

## 2020-04-12 ENCOUNTER — Other Ambulatory Visit: Payer: Self-pay

## 2020-04-12 DIAGNOSIS — C61 Malignant neoplasm of prostate: Secondary | ICD-10-CM | POA: Diagnosis not present

## 2020-04-12 DIAGNOSIS — Z510 Encounter for antineoplastic radiation therapy: Secondary | ICD-10-CM | POA: Diagnosis not present

## 2020-04-12 NOTE — Telephone Encounter (Signed)
I spoke with the patient and he agreed to send the transmission before noon.

## 2020-04-15 ENCOUNTER — Ambulatory Visit
Admission: RE | Admit: 2020-04-15 | Discharge: 2020-04-15 | Disposition: A | Discharge status: Home / Self Care | Payer: Medicare Other | Source: Ambulatory Visit | Attending: Radiation Oncology | Admitting: Radiation Oncology

## 2020-04-15 ENCOUNTER — Other Ambulatory Visit: Payer: Self-pay

## 2020-04-15 DIAGNOSIS — C61 Malignant neoplasm of prostate: Secondary | ICD-10-CM | POA: Diagnosis not present

## 2020-04-15 DIAGNOSIS — Z510 Encounter for antineoplastic radiation therapy: Secondary | ICD-10-CM | POA: Diagnosis not present

## 2020-04-16 ENCOUNTER — Ambulatory Visit
Admission: RE | Admit: 2020-04-16 | Discharge: 2020-04-16 | Disposition: A | Discharge status: Home / Self Care | Payer: Medicare Other | Source: Ambulatory Visit | Attending: Radiation Oncology | Admitting: Radiation Oncology

## 2020-04-16 ENCOUNTER — Other Ambulatory Visit: Payer: Self-pay

## 2020-04-16 DIAGNOSIS — C61 Malignant neoplasm of prostate: Secondary | ICD-10-CM | POA: Diagnosis not present

## 2020-04-16 DIAGNOSIS — Z510 Encounter for antineoplastic radiation therapy: Secondary | ICD-10-CM | POA: Diagnosis not present

## 2020-04-16 NOTE — Telephone Encounter (Signed)
Manual transmission exported to MD ahead of upcoming appt on 2/18

## 2020-04-16 NOTE — Telephone Encounter (Signed)
Transmission received 04/13/2020

## 2020-04-17 ENCOUNTER — Ambulatory Visit
Admission: RE | Admit: 2020-04-17 | Discharge: 2020-04-17 | Disposition: A | Discharge status: Home / Self Care | Payer: Medicare Other | Source: Ambulatory Visit | Attending: Radiation Oncology | Admitting: Radiation Oncology

## 2020-04-17 DIAGNOSIS — Z510 Encounter for antineoplastic radiation therapy: Secondary | ICD-10-CM | POA: Diagnosis not present

## 2020-04-17 DIAGNOSIS — C61 Malignant neoplasm of prostate: Secondary | ICD-10-CM | POA: Diagnosis not present

## 2020-04-18 ENCOUNTER — Other Ambulatory Visit: Payer: Self-pay

## 2020-04-18 ENCOUNTER — Ambulatory Visit
Admission: RE | Admit: 2020-04-18 | Discharge: 2020-04-18 | Disposition: A | Discharge status: Home / Self Care | Payer: Medicare Other | Source: Ambulatory Visit | Attending: Radiation Oncology | Admitting: Radiation Oncology

## 2020-04-18 ENCOUNTER — Other Ambulatory Visit: Payer: Self-pay | Admitting: Radiation Oncology

## 2020-04-18 DIAGNOSIS — C61 Malignant neoplasm of prostate: Secondary | ICD-10-CM | POA: Diagnosis not present

## 2020-04-18 DIAGNOSIS — Z510 Encounter for antineoplastic radiation therapy: Secondary | ICD-10-CM | POA: Diagnosis not present

## 2020-04-18 MED ORDER — TAMSULOSIN HCL 0.4 MG PO CAPS: 0.4000 mg | ORAL_CAPSULE | Freq: Every day | ORAL | 5 refills | Status: DC

## 2020-04-19 ENCOUNTER — Ambulatory Visit
Admission: RE | Admit: 2020-04-19 | Discharge: 2020-04-19 | Disposition: A | Discharge status: Home / Self Care | Payer: Medicare Other | Source: Ambulatory Visit | Attending: Radiation Oncology | Admitting: Radiation Oncology

## 2020-04-19 DIAGNOSIS — Z510 Encounter for antineoplastic radiation therapy: Secondary | ICD-10-CM | POA: Diagnosis not present

## 2020-04-19 DIAGNOSIS — C61 Malignant neoplasm of prostate: Secondary | ICD-10-CM | POA: Diagnosis not present

## 2020-04-22 ENCOUNTER — Other Ambulatory Visit: Payer: Self-pay

## 2020-04-22 ENCOUNTER — Ambulatory Visit
Admission: RE | Admit: 2020-04-22 | Discharge: 2020-04-22 | Disposition: A | Discharge status: Home / Self Care | Payer: Medicare Other | Source: Ambulatory Visit | Attending: Radiation Oncology | Admitting: Radiation Oncology

## 2020-04-22 DIAGNOSIS — Z510 Encounter for antineoplastic radiation therapy: Secondary | ICD-10-CM | POA: Diagnosis not present

## 2020-04-22 DIAGNOSIS — C61 Malignant neoplasm of prostate: Secondary | ICD-10-CM | POA: Diagnosis not present

## 2020-04-23 ENCOUNTER — Ambulatory Visit
Admission: RE | Admit: 2020-04-23 | Discharge: 2020-04-23 | Disposition: A | Discharge status: Home / Self Care | Payer: Medicare Other | Source: Ambulatory Visit | Attending: Radiation Oncology | Admitting: Radiation Oncology

## 2020-04-23 DIAGNOSIS — Z510 Encounter for antineoplastic radiation therapy: Secondary | ICD-10-CM | POA: Diagnosis not present

## 2020-04-23 DIAGNOSIS — C61 Malignant neoplasm of prostate: Secondary | ICD-10-CM | POA: Diagnosis not present

## 2020-04-24 ENCOUNTER — Ambulatory Visit: Payer: Medicare Other

## 2020-04-24 ENCOUNTER — Ambulatory Visit
Admission: RE | Admit: 2020-04-24 | Discharge: 2020-04-24 | Disposition: A | Discharge status: Home / Self Care | Payer: Medicare Other | Source: Ambulatory Visit | Attending: Radiation Oncology | Admitting: Radiation Oncology

## 2020-04-24 ENCOUNTER — Other Ambulatory Visit: Payer: Self-pay

## 2020-04-24 DIAGNOSIS — Z510 Encounter for antineoplastic radiation therapy: Secondary | ICD-10-CM | POA: Diagnosis not present

## 2020-04-24 DIAGNOSIS — C61 Malignant neoplasm of prostate: Secondary | ICD-10-CM | POA: Diagnosis not present

## 2020-04-25 ENCOUNTER — Ambulatory Visit
Admission: RE | Admit: 2020-04-25 | Discharge: 2020-04-25 | Disposition: A | Discharge status: Home / Self Care | Payer: Medicare Other | Source: Ambulatory Visit | Attending: Radiation Oncology | Admitting: Radiation Oncology

## 2020-04-25 ENCOUNTER — Ambulatory Visit: Payer: Medicare Other

## 2020-04-25 DIAGNOSIS — C61 Malignant neoplasm of prostate: Secondary | ICD-10-CM | POA: Diagnosis not present

## 2020-04-25 DIAGNOSIS — Z510 Encounter for antineoplastic radiation therapy: Secondary | ICD-10-CM | POA: Diagnosis not present

## 2020-04-26 ENCOUNTER — Ambulatory Visit
Admission: RE | Admit: 2020-04-26 | Discharge: 2020-04-26 | Disposition: A | Discharge status: Home / Self Care | Payer: Medicare Other | Source: Ambulatory Visit | Attending: Radiation Oncology | Admitting: Radiation Oncology

## 2020-04-26 ENCOUNTER — Other Ambulatory Visit: Payer: Self-pay

## 2020-04-26 ENCOUNTER — Ambulatory Visit: Payer: Medicare Other

## 2020-04-26 ENCOUNTER — Encounter: Payer: Medicare Other | Admitting: Internal Medicine

## 2020-04-26 DIAGNOSIS — C61 Malignant neoplasm of prostate: Secondary | ICD-10-CM | POA: Diagnosis not present

## 2020-04-26 DIAGNOSIS — Z510 Encounter for antineoplastic radiation therapy: Secondary | ICD-10-CM | POA: Diagnosis not present

## 2020-04-29 ENCOUNTER — Ambulatory Visit
Admission: RE | Admit: 2020-04-29 | Discharge: 2020-04-29 | Disposition: A | Discharge status: Home / Self Care | Payer: Medicare Other | Source: Ambulatory Visit | Attending: Radiation Oncology | Admitting: Radiation Oncology

## 2020-04-29 ENCOUNTER — Other Ambulatory Visit: Payer: Self-pay

## 2020-04-29 ENCOUNTER — Ambulatory Visit: Payer: Medicare Other

## 2020-04-29 DIAGNOSIS — Z510 Encounter for antineoplastic radiation therapy: Secondary | ICD-10-CM | POA: Diagnosis not present

## 2020-04-29 DIAGNOSIS — C61 Malignant neoplasm of prostate: Secondary | ICD-10-CM | POA: Diagnosis not present

## 2020-04-30 ENCOUNTER — Ambulatory Visit
Admission: RE | Admit: 2020-04-30 | Discharge: 2020-04-30 | Disposition: A | Discharge status: Home / Self Care | Payer: Medicare Other | Source: Ambulatory Visit | Attending: Radiation Oncology | Admitting: Radiation Oncology

## 2020-04-30 ENCOUNTER — Other Ambulatory Visit: Payer: Self-pay

## 2020-04-30 DIAGNOSIS — Z510 Encounter for antineoplastic radiation therapy: Secondary | ICD-10-CM | POA: Diagnosis not present

## 2020-04-30 DIAGNOSIS — C61 Malignant neoplasm of prostate: Secondary | ICD-10-CM | POA: Diagnosis not present

## 2020-05-01 ENCOUNTER — Ambulatory Visit
Admission: RE | Admit: 2020-05-01 | Discharge: 2020-05-01 | Disposition: A | Discharge status: Home / Self Care | Payer: Medicare Other | Source: Ambulatory Visit | Attending: Radiation Oncology | Admitting: Radiation Oncology

## 2020-05-01 ENCOUNTER — Ambulatory Visit (INDEPENDENT_AMBULATORY_CARE_PROVIDER_SITE_OTHER): Payer: Medicare Other

## 2020-05-01 ENCOUNTER — Other Ambulatory Visit: Payer: Self-pay

## 2020-05-01 DIAGNOSIS — I495 Sick sinus syndrome: Secondary | ICD-10-CM | POA: Diagnosis not present

## 2020-05-01 DIAGNOSIS — Z510 Encounter for antineoplastic radiation therapy: Secondary | ICD-10-CM | POA: Diagnosis not present

## 2020-05-01 DIAGNOSIS — C61 Malignant neoplasm of prostate: Secondary | ICD-10-CM | POA: Diagnosis not present

## 2020-05-02 ENCOUNTER — Ambulatory Visit
Admission: RE | Admit: 2020-05-02 | Discharge: 2020-05-02 | Disposition: A | Discharge status: Home / Self Care | Payer: Medicare Other | Source: Ambulatory Visit | Attending: Radiation Oncology | Admitting: Radiation Oncology

## 2020-05-02 ENCOUNTER — Other Ambulatory Visit: Payer: Self-pay

## 2020-05-02 DIAGNOSIS — Z510 Encounter for antineoplastic radiation therapy: Secondary | ICD-10-CM | POA: Diagnosis not present

## 2020-05-02 DIAGNOSIS — C61 Malignant neoplasm of prostate: Secondary | ICD-10-CM | POA: Diagnosis not present

## 2020-05-02 LAB — CUP PACEART REMOTE DEVICE CHECK
Battery Remaining Longevity: 112 mo
Battery Remaining Percentage: 95.5 %
Battery Voltage: 2.99 V
Brady Statistic AP VP Percent: 0 %
Brady Statistic AP VS Percent: 0 %
Brady Statistic AS VP Percent: 99 %
Brady Statistic AS VS Percent: 0 %
Brady Statistic RA Percent Paced: 1 %
Brady Statistic RV Percent Paced: 69 %
Date Time Interrogation Session: 20220223020015
Implantable Lead Implant Date: 20181211
Implantable Lead Implant Date: 20181211
Implantable Lead Location: 753859
Implantable Lead Location: 753860
Implantable Pulse Generator Implant Date: 20181211
Lead Channel Impedance Value: 400 Ohm
Lead Channel Impedance Value: 440 Ohm
Lead Channel Pacing Threshold Amplitude: 0.625 V
Lead Channel Pacing Threshold Amplitude: 0.75 V
Lead Channel Pacing Threshold Pulse Width: 0.5 ms
Lead Channel Pacing Threshold Pulse Width: 0.5 ms
Lead Channel Sensing Intrinsic Amplitude: 5 mV
Lead Channel Sensing Intrinsic Amplitude: 9.3 mV
Lead Channel Setting Pacing Amplitude: 1.625
Lead Channel Setting Pacing Amplitude: 2.5 V
Lead Channel Setting Pacing Pulse Width: 0.5 ms
Lead Channel Setting Sensing Sensitivity: 2 mV
Pulse Gen Model: 2272
Pulse Gen Serial Number: 8967264

## 2020-05-03 ENCOUNTER — Ambulatory Visit
Admission: RE | Admit: 2020-05-03 | Discharge: 2020-05-03 | Disposition: A | Discharge status: Home / Self Care | Payer: Medicare Other | Source: Ambulatory Visit | Attending: Radiation Oncology | Admitting: Radiation Oncology

## 2020-05-03 ENCOUNTER — Other Ambulatory Visit: Payer: Self-pay

## 2020-05-03 DIAGNOSIS — C61 Malignant neoplasm of prostate: Secondary | ICD-10-CM | POA: Diagnosis not present

## 2020-05-03 DIAGNOSIS — Z510 Encounter for antineoplastic radiation therapy: Secondary | ICD-10-CM | POA: Diagnosis not present

## 2020-05-06 ENCOUNTER — Ambulatory Visit
Admission: RE | Admit: 2020-05-06 | Discharge: 2020-05-06 | Disposition: A | Discharge status: Home / Self Care | Payer: Medicare Other | Source: Ambulatory Visit | Attending: Radiation Oncology | Admitting: Radiation Oncology

## 2020-05-06 ENCOUNTER — Other Ambulatory Visit: Payer: Self-pay

## 2020-05-06 DIAGNOSIS — C61 Malignant neoplasm of prostate: Secondary | ICD-10-CM | POA: Diagnosis not present

## 2020-05-06 DIAGNOSIS — Z510 Encounter for antineoplastic radiation therapy: Secondary | ICD-10-CM | POA: Diagnosis not present

## 2020-05-07 ENCOUNTER — Other Ambulatory Visit: Payer: Self-pay

## 2020-05-07 ENCOUNTER — Ambulatory Visit
Admission: RE | Admit: 2020-05-07 | Discharge: 2020-05-07 | Disposition: A | Discharge status: Home / Self Care | Payer: Medicare Other | Source: Ambulatory Visit | Attending: Radiation Oncology | Admitting: Radiation Oncology

## 2020-05-07 DIAGNOSIS — Z510 Encounter for antineoplastic radiation therapy: Secondary | ICD-10-CM | POA: Insufficient documentation

## 2020-05-07 DIAGNOSIS — C61 Malignant neoplasm of prostate: Secondary | ICD-10-CM | POA: Insufficient documentation

## 2020-05-08 ENCOUNTER — Other Ambulatory Visit: Payer: Self-pay

## 2020-05-08 ENCOUNTER — Ambulatory Visit
Admission: RE | Admit: 2020-05-08 | Discharge: 2020-05-08 | Disposition: A | Discharge status: Home / Self Care | Payer: Medicare Other | Source: Ambulatory Visit | Attending: Radiation Oncology | Admitting: Radiation Oncology

## 2020-05-08 DIAGNOSIS — Z510 Encounter for antineoplastic radiation therapy: Secondary | ICD-10-CM | POA: Diagnosis not present

## 2020-05-08 DIAGNOSIS — C61 Malignant neoplasm of prostate: Secondary | ICD-10-CM | POA: Diagnosis not present

## 2020-05-08 NOTE — Progress Notes (Signed)
Remote pacemaker transmission.   

## 2020-05-09 ENCOUNTER — Ambulatory Visit
Admission: RE | Admit: 2020-05-09 | Discharge: 2020-05-09 | Disposition: A | Discharge status: Home / Self Care | Payer: Medicare Other | Source: Ambulatory Visit | Attending: Radiation Oncology | Admitting: Radiation Oncology

## 2020-05-09 ENCOUNTER — Other Ambulatory Visit: Payer: Self-pay

## 2020-05-09 DIAGNOSIS — C61 Malignant neoplasm of prostate: Secondary | ICD-10-CM | POA: Diagnosis not present

## 2020-05-09 DIAGNOSIS — Z510 Encounter for antineoplastic radiation therapy: Secondary | ICD-10-CM | POA: Diagnosis not present

## 2020-05-10 ENCOUNTER — Other Ambulatory Visit: Payer: Self-pay

## 2020-05-10 ENCOUNTER — Ambulatory Visit
Admission: RE | Admit: 2020-05-10 | Discharge: 2020-05-10 | Disposition: A | Discharge status: Home / Self Care | Payer: Medicare Other | Source: Ambulatory Visit | Attending: Radiation Oncology | Admitting: Radiation Oncology

## 2020-05-10 DIAGNOSIS — C61 Malignant neoplasm of prostate: Secondary | ICD-10-CM | POA: Diagnosis not present

## 2020-05-10 DIAGNOSIS — Z510 Encounter for antineoplastic radiation therapy: Secondary | ICD-10-CM | POA: Diagnosis not present

## 2020-05-13 ENCOUNTER — Ambulatory Visit (INDEPENDENT_AMBULATORY_CARE_PROVIDER_SITE_OTHER): Payer: Medicare Other | Admitting: Internal Medicine

## 2020-05-13 ENCOUNTER — Ambulatory Visit
Admission: RE | Admit: 2020-05-13 | Discharge: 2020-05-13 | Disposition: A | Discharge status: Home / Self Care | Payer: Medicare Other | Source: Ambulatory Visit | Attending: Radiation Oncology | Admitting: Radiation Oncology

## 2020-05-13 ENCOUNTER — Encounter: Payer: Self-pay | Admitting: Internal Medicine

## 2020-05-13 ENCOUNTER — Other Ambulatory Visit: Payer: Self-pay

## 2020-05-13 VITALS — BP 106/66 | HR 70 | Ht 74.0 in | Wt 208.0 lb

## 2020-05-13 DIAGNOSIS — I4821 Permanent atrial fibrillation: Secondary | ICD-10-CM

## 2020-05-13 DIAGNOSIS — Z950 Presence of cardiac pacemaker: Secondary | ICD-10-CM | POA: Insufficient documentation

## 2020-05-13 DIAGNOSIS — R001 Bradycardia, unspecified: Secondary | ICD-10-CM

## 2020-05-13 DIAGNOSIS — C61 Malignant neoplasm of prostate: Secondary | ICD-10-CM | POA: Diagnosis not present

## 2020-05-13 DIAGNOSIS — R0602 Shortness of breath: Secondary | ICD-10-CM

## 2020-05-13 DIAGNOSIS — Z510 Encounter for antineoplastic radiation therapy: Secondary | ICD-10-CM | POA: Diagnosis not present

## 2020-05-13 NOTE — Patient Instructions (Addendum)
Medication Instructions:  Your physician recommends that you continue on your current medications as directed. Please refer to the Current Medication list given to you today.  Labwork: None ordered.  Testing/Procedures: Your physician has requested that you have an echocardiogram. Echocardiography is a painless test that uses sound waves to create images of your heart. It provides your doctor with information about the size and shape of your heart and how well your heart's chambers and valves are working. This procedure takes approximately one hour. There are no restrictions for this procedure.  Please schedule for ECHO  Your physician has requested that you have a lexiscan myoview.   Please schedule for lexiscan myoview  Follow-Up: Your physician wants you to follow-up in: one year with Tommye Standard, PA.   You will receive a reminder letter in the mail two months in advance. If you don't receive a letter, please call our office to schedule the follow-up appointment.  Remote monitoring is used to monitor your Pacemaker from home. This monitoring reduces the number of office visits required to check your device to one time per year. It allows [place] to keep an eye on the functioning of your device to ensure it is working properly. You are scheduled for a device check from home on 07/31/2020. You may send your transmission at any time that day. If you have a wireless device, the transmission will be sent automatically. After your physician reviews your transmission, you will receive a postcard with your next transmission date.  Any Other Special Instructions Will Be Listed Below (If Applicable).  If you need a refill on your cardiac medications before your next appointment, please call your pharmacy.   [facility] in [place], [place] Address: [address], [place], [place] [address] [place] 24 hours Phone: [phone]

## 2020-05-13 NOTE — Progress Notes (Signed)
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 male who presents today for routine electrophysiology followup.  Since last being seen in our clinic, the patient reports doing reasonably well.   He has been diagnosed with prostate cancer.  He is having XRT and therapy currently.  He has fatigue and some SOB.  These are new.  Though he thinks that they may be due to his treatments, he is concerned.   + mild ankle edema.  Today, he denies symptoms of palpitations, chest pain, dizziness, presyncope, or syncope.  The patient is otherwise without complaint today.   Past Medical History:  Diagnosis Date  . Arthritis    "thumbs, back" (02/16/2017)  . Bradycardia 02/2017  . Dyspnea    with exertion  . Flu 2 weeks ago  . GERD (gastroesophageal reflux disease)   . Hepatitis A as child  . HLD (hyperlipidemia)   . Persistent atrial fibrillation ([place]) 12/18/2016  . Pneumonia 1999; 2018  . 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 Prostate cancer [facility])    Past Surgical History:  Procedure Laterality Date  . APPENDECTOMY     age 49  . CARDIOVERSION  02/16/2017  . CARDIOVERSION N/A 02/16/2017   Procedure: CARDIOVERSION;  Surgeon: Thompson Grayer, MD;  Location: [facility];  Service: Cardiovascular;  Laterality: N/A;  . COLONOSCOPY WITH PROPOFOL N/A 12/13/2012   Procedure: COLONOSCOPY WITH PROPOFOL;  Surgeon: Garlan Fair, MD;  Location: [facility];  Service: Endoscopy;  Laterality: N/A;  . FRACTURE SURGERY    . HYDROCELE EXCISION Left 05/16/2018   Procedure: HYDROCELECTOMY ADULT;  Surgeon: [name], MD;  Location: [facility];  Service: Urology;  Laterality: Left;  . INSERT / REPLACE / REMOVE PACEMAKER  02/16/2017  . 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 PROSTATE BIOPSY  11/23/2019  . WRIST FRACTURE  SURGERY Left    with pins    ROS- all systems are reviewed and negative except as per HPI above  Current Outpatient Medications  Medication Sig Dispense Refill  . acetaminophen (TYLENOL) 500 MG tablet Take 500 mg by mouth daily as needed for mild pain.    Marland Kitchen ELIQUIS 5 MG TABS tablet TAKE 1 TABLET(5 MG) BY MOUTH TWICE DAILY 180 tablet 1  . loratadine (CLARITIN) 10 MG tablet Take 10 mg by mouth daily as needed for allergies.    . Multiple Vitamin (MULTIVITAMIN WITH MINERALS) TABS tablet Take 1 tablet by mouth daily.    . Multiple Vitamins-Minerals (EYE VITAMINS) CAPS Take 1 capsule by mouth daily.     . mupirocin ointment (BACTROBAN) 2 % Place 1 application into the nose daily as needed (dry nose).    . ondansetron (ZOFRAN ODT) 4 MG disintegrating tablet Take 1 tablet (4 mg total) by mouth every 4 (four) hours as needed for nausea or vomiting. 20 tablet 0  . oxyCODONE-acetaminophen (PERCOCET) 5-325 MG tablet Take 1-2 tablets by mouth every 4 (four) hours as needed. 20 tablet 0  . pantoprazole (PROTONIX) 40 MG tablet Take 40 mg daily by mouth.  2  . rosuvastatin (CRESTOR) 10 MG tablet Take 10 mg by mouth every morning.    . tamsulosin (FLOMAX) 0.4 MG CAPS capsule Take 1 capsule (0.4 mg total) by mouth daily after supper. 30 capsule 5   No  current facility-administered medications for this visit.    Physical Exam: Vitals:   05/13/20 1548  BP: 106/66  Pulse: 70  SpO2: 96%  Weight: 208 lb (94.3 kg)  Height: 6\' 2"  (1.88 m)    GEN- The patient is well appearing, alert and oriented x 3 today.   Head- normocephalic, atraumatic Eyes-  Sclera clear, conjunctiva pink Ears- hearing intact Oropharynx- clear Lungs-   normal work of breathing Chest- pacemaker pocket is well healed Heart- irregular rate and rhythm  GI- soft,  Extremities- no clubbing, cyanosis, +1 ankle edema  Pacemaker interrogation- reviewed in detail today,  See PACEART report  ekg tracing ordered today is personally  reviewed and shows afib,  Assessment and Plan:  1. Symptomatic bradycardia  Normal pacemaker function See Pace Art report Reprogrammed VVIR  he is not device dependant today  2. Permanent afib Asymptomatic Rate controlled On eliquis for chads2vas score of at least 2  3. SOB/ fatigue I agree that this could certainly be due to prostate Ca therapy including XRT and hormonal therapy.  I will also order echo and myoview to exclude cardiac cause  Risks, benefits and potential toxicities for medications prescribed and/or refilled reviewed with patient today.   Return to see EP PA in a year  Thompson Grayer MD, [facility] 05/13/2020 3:57 PM

## 2020-05-14 ENCOUNTER — Ambulatory Visit
Admission: RE | Admit: 2020-05-14 | Discharge: 2020-05-14 | Disposition: A | Discharge status: Home / Self Care | Payer: Medicare Other | Source: Ambulatory Visit | Attending: Radiation Oncology | Admitting: Radiation Oncology

## 2020-05-14 ENCOUNTER — Ambulatory Visit: Payer: Medicare Other

## 2020-05-14 ENCOUNTER — Other Ambulatory Visit: Payer: Self-pay

## 2020-05-14 DIAGNOSIS — Z510 Encounter for antineoplastic radiation therapy: Secondary | ICD-10-CM | POA: Diagnosis not present

## 2020-05-14 DIAGNOSIS — C61 Malignant neoplasm of prostate: Secondary | ICD-10-CM | POA: Diagnosis not present

## 2020-05-15 ENCOUNTER — Ambulatory Visit
Admission: RE | Admit: 2020-05-15 | Discharge: 2020-05-15 | Disposition: A | Discharge status: Home / Self Care | Payer: Medicare Other | Source: Ambulatory Visit | Attending: Radiation Oncology | Admitting: Radiation Oncology

## 2020-05-15 ENCOUNTER — Other Ambulatory Visit: Payer: Self-pay

## 2020-05-15 ENCOUNTER — Ambulatory Visit: Payer: Medicare Other

## 2020-05-15 DIAGNOSIS — C61 Malignant neoplasm of prostate: Secondary | ICD-10-CM | POA: Diagnosis not present

## 2020-05-15 DIAGNOSIS — Z510 Encounter for antineoplastic radiation therapy: Secondary | ICD-10-CM | POA: Diagnosis not present

## 2020-05-16 ENCOUNTER — Other Ambulatory Visit: Payer: Self-pay

## 2020-05-16 ENCOUNTER — Ambulatory Visit: Payer: Medicare Other

## 2020-05-16 ENCOUNTER — Ambulatory Visit
Admission: RE | Admit: 2020-05-16 | Discharge: 2020-05-16 | Disposition: A | Discharge status: Home / Self Care | Payer: Medicare Other | Source: Ambulatory Visit | Attending: Radiation Oncology | Admitting: Radiation Oncology

## 2020-05-16 DIAGNOSIS — Z510 Encounter for antineoplastic radiation therapy: Secondary | ICD-10-CM | POA: Diagnosis not present

## 2020-05-16 DIAGNOSIS — C61 Malignant neoplasm of prostate: Secondary | ICD-10-CM | POA: Diagnosis not present

## 2020-05-16 LAB — CUP PACEART INCLINIC DEVICE CHECK
Battery Remaining Longevity: 117 mo
Battery Voltage: 2.99 V
Brady Statistic RA Percent Paced: 0 %
Brady Statistic RV Percent Paced: 69 %
Date Time Interrogation Session: 20220307154600
Implantable Lead Implant Date: 20181211
Implantable Lead Implant Date: 20181211
Implantable Lead Location: 753859
Implantable Lead Location: 753860
Implantable Pulse Generator Implant Date: 20181211
Lead Channel Impedance Value: 400 Ohm
Lead Channel Impedance Value: 450 Ohm
Lead Channel Pacing Threshold Amplitude: 0.75 V
Lead Channel Pacing Threshold Pulse Width: 0.5 ms
Lead Channel Sensing Intrinsic Amplitude: 10 mV
Lead Channel Sensing Intrinsic Amplitude: 5 mV
Lead Channel Setting Pacing Amplitude: 2.5 V
Lead Channel Setting Pacing Pulse Width: 0.5 ms
Lead Channel Setting Sensing Sensitivity: 2 mV
Pulse Gen Model: 2272
Pulse Gen Serial Number: 8967264

## 2020-05-17 ENCOUNTER — Ambulatory Visit: Payer: Medicare Other

## 2020-05-17 ENCOUNTER — Ambulatory Visit
Admission: RE | Admit: 2020-05-17 | Discharge: 2020-05-17 | Disposition: A | Discharge status: Home / Self Care | Payer: Medicare Other | Source: Ambulatory Visit | Attending: Radiation Oncology | Admitting: Radiation Oncology

## 2020-05-17 ENCOUNTER — Other Ambulatory Visit: Payer: Self-pay

## 2020-05-17 DIAGNOSIS — C61 Malignant neoplasm of prostate: Secondary | ICD-10-CM | POA: Diagnosis not present

## 2020-05-17 DIAGNOSIS — Z510 Encounter for antineoplastic radiation therapy: Secondary | ICD-10-CM | POA: Diagnosis not present

## 2020-05-20 ENCOUNTER — Non-Acute Institutional Stay: Payer: Self-pay | Admitting: Medical Oncology

## 2020-05-20 ENCOUNTER — Other Ambulatory Visit: Payer: Self-pay

## 2020-05-20 ENCOUNTER — Ambulatory Visit
Admission: RE | Admit: 2020-05-20 | Discharge: 2020-05-20 | Disposition: A | Discharge status: Home / Self Care | Payer: Medicare Other | Source: Ambulatory Visit | Attending: Radiation Oncology | Admitting: Radiation Oncology

## 2020-05-20 ENCOUNTER — Encounter: Payer: Self-pay | Admitting: Urology

## 2020-05-20 DIAGNOSIS — Z510 Encounter for antineoplastic radiation therapy: Secondary | ICD-10-CM | POA: Diagnosis not present

## 2020-05-20 DIAGNOSIS — C61 Malignant neoplasm of prostate: Secondary | ICD-10-CM | POA: Diagnosis not present

## 2020-06-01 ENCOUNTER — Other Ambulatory Visit: Payer: Self-pay

## 2020-06-01 ENCOUNTER — Emergency Department (HOSPITAL_BASED_OUTPATIENT_CLINIC_OR_DEPARTMENT_OTHER): Payer: Medicare Other

## 2020-06-01 ENCOUNTER — Inpatient Hospital Stay (HOSPITAL_COMMUNITY): Payer: Medicare Other

## 2020-06-01 ENCOUNTER — Encounter (HOSPITAL_BASED_OUTPATIENT_CLINIC_OR_DEPARTMENT_OTHER): Payer: Self-pay | Admitting: Emergency Medicine

## 2020-06-01 ENCOUNTER — Inpatient Hospital Stay (HOSPITAL_BASED_OUTPATIENT_CLINIC_OR_DEPARTMENT_OTHER)
Admission: EM | Admit: 2020-06-01 | Discharge: 2020-06-06 | DRG: 335 | Disposition: A | Discharge status: Home / Self Care | Payer: Medicare Other | Attending: Internal Medicine | Admitting: Internal Medicine

## 2020-06-01 DIAGNOSIS — R7303 Prediabetes: Secondary | ICD-10-CM | POA: Diagnosis not present

## 2020-06-01 DIAGNOSIS — E559 Vitamin D deficiency, unspecified: Secondary | ICD-10-CM | POA: Diagnosis not present

## 2020-06-01 DIAGNOSIS — E785 Hyperlipidemia, unspecified: Secondary | ICD-10-CM | POA: Diagnosis not present

## 2020-06-01 DIAGNOSIS — E7800 Pure hypercholesterolemia, unspecified: Secondary | ICD-10-CM | POA: Diagnosis not present

## 2020-06-01 DIAGNOSIS — R001 Bradycardia, unspecified: Secondary | ICD-10-CM | POA: Diagnosis present

## 2020-06-01 DIAGNOSIS — J690 Pneumonitis due to inhalation of food and vomit: Secondary | ICD-10-CM | POA: Diagnosis not present

## 2020-06-01 DIAGNOSIS — D649 Anemia, unspecified: Secondary | ICD-10-CM | POA: Diagnosis present

## 2020-06-01 DIAGNOSIS — Z923 Personal history of irradiation: Secondary | ICD-10-CM

## 2020-06-01 DIAGNOSIS — K6389 Other specified diseases of intestine: Secondary | ICD-10-CM | POA: Diagnosis not present

## 2020-06-01 DIAGNOSIS — J9601 Acute respiratory failure with hypoxia: Secondary | ICD-10-CM | POA: Diagnosis present

## 2020-06-01 DIAGNOSIS — D72819 Decreased white blood cell count, unspecified: Secondary | ICD-10-CM | POA: Diagnosis present

## 2020-06-01 DIAGNOSIS — N179 Acute kidney failure, unspecified: Secondary | ICD-10-CM | POA: Diagnosis present

## 2020-06-01 DIAGNOSIS — K469 Unspecified abdominal hernia without obstruction or gangrene: Secondary | ICD-10-CM | POA: Diagnosis present

## 2020-06-01 DIAGNOSIS — Z8701 Personal history of pneumonia (recurrent): Secondary | ICD-10-CM

## 2020-06-01 DIAGNOSIS — Z8249 Family history of ischemic heart disease and other diseases of the circulatory system: Secondary | ICD-10-CM

## 2020-06-01 DIAGNOSIS — M479 Spondylosis, unspecified: Secondary | ICD-10-CM | POA: Diagnosis present

## 2020-06-01 DIAGNOSIS — J9811 Atelectasis: Secondary | ICD-10-CM | POA: Diagnosis not present

## 2020-06-01 DIAGNOSIS — Z800 Family history of malignant neoplasm of digestive organs: Secondary | ICD-10-CM

## 2020-06-01 DIAGNOSIS — R188 Other ascites: Secondary | ICD-10-CM | POA: Diagnosis not present

## 2020-06-01 DIAGNOSIS — I4819 Other persistent atrial fibrillation: Secondary | ICD-10-CM | POA: Diagnosis present

## 2020-06-01 DIAGNOSIS — Z20822 Contact with and (suspected) exposure to covid-19: Secondary | ICD-10-CM | POA: Diagnosis not present

## 2020-06-01 DIAGNOSIS — Z8546 Personal history of malignant neoplasm of prostate: Secondary | ICD-10-CM | POA: Diagnosis not present

## 2020-06-01 DIAGNOSIS — R17 Unspecified jaundice: Secondary | ICD-10-CM | POA: Diagnosis present

## 2020-06-01 DIAGNOSIS — K565 Intestinal adhesions [bands], unspecified as to partial versus complete obstruction: Secondary | ICD-10-CM | POA: Diagnosis not present

## 2020-06-01 DIAGNOSIS — K219 Gastro-esophageal reflux disease without esophagitis: Secondary | ICD-10-CM | POA: Diagnosis not present

## 2020-06-01 DIAGNOSIS — Z9049 Acquired absence of other specified parts of digestive tract: Secondary | ICD-10-CM

## 2020-06-01 DIAGNOSIS — I4811 Longstanding persistent atrial fibrillation: Secondary | ICD-10-CM | POA: Diagnosis not present

## 2020-06-01 DIAGNOSIS — C61 Malignant neoplasm of prostate: Secondary | ICD-10-CM | POA: Diagnosis not present

## 2020-06-01 DIAGNOSIS — Z950 Presence of cardiac pacemaker: Secondary | ICD-10-CM

## 2020-06-01 DIAGNOSIS — Z4659 Encounter for fitting and adjustment of other gastrointestinal appliance and device: Secondary | ICD-10-CM | POA: Diagnosis not present

## 2020-06-01 DIAGNOSIS — K5939 Other megacolon: Secondary | ICD-10-CM | POA: Diagnosis not present

## 2020-06-01 DIAGNOSIS — M19042 Primary osteoarthritis, left hand: Secondary | ICD-10-CM | POA: Diagnosis present

## 2020-06-01 DIAGNOSIS — Z79899 Other long term (current) drug therapy: Secondary | ICD-10-CM

## 2020-06-01 DIAGNOSIS — K56609 Unspecified intestinal obstruction, unspecified as to partial versus complete obstruction: Secondary | ICD-10-CM | POA: Diagnosis not present

## 2020-06-01 DIAGNOSIS — R0602 Shortness of breath: Secondary | ICD-10-CM

## 2020-06-01 DIAGNOSIS — R059 Cough, unspecified: Secondary | ICD-10-CM | POA: Diagnosis not present

## 2020-06-01 DIAGNOSIS — D696 Thrombocytopenia, unspecified: Secondary | ICD-10-CM | POA: Diagnosis present

## 2020-06-01 DIAGNOSIS — K5651 Intestinal adhesions [bands], with partial obstruction: Secondary | ICD-10-CM | POA: Diagnosis not present

## 2020-06-01 DIAGNOSIS — K56699 Other intestinal obstruction unspecified as to partial versus complete obstruction: Secondary | ICD-10-CM | POA: Diagnosis not present

## 2020-06-01 DIAGNOSIS — Z0189 Encounter for other specified special examinations: Secondary | ICD-10-CM

## 2020-06-01 DIAGNOSIS — Z4682 Encounter for fitting and adjustment of non-vascular catheter: Secondary | ICD-10-CM | POA: Diagnosis not present

## 2020-06-01 DIAGNOSIS — Z87891 Personal history of nicotine dependence: Secondary | ICD-10-CM

## 2020-06-01 DIAGNOSIS — Z7901 Long term (current) use of anticoagulants: Secondary | ICD-10-CM

## 2020-06-01 DIAGNOSIS — M19041 Primary osteoarthritis, right hand: Secondary | ICD-10-CM | POA: Diagnosis present

## 2020-06-01 DIAGNOSIS — K5730 Diverticulosis of large intestine without perforation or abscess without bleeding: Secondary | ICD-10-CM | POA: Diagnosis not present

## 2020-06-01 DIAGNOSIS — K5669 Other partial intestinal obstruction: Secondary | ICD-10-CM | POA: Diagnosis not present

## 2020-06-01 DIAGNOSIS — E860 Dehydration: Secondary | ICD-10-CM | POA: Diagnosis present

## 2020-06-01 DIAGNOSIS — Z8719 Personal history of other diseases of the digestive system: Secondary | ICD-10-CM | POA: Diagnosis not present

## 2020-06-01 DIAGNOSIS — R0600 Dyspnea, unspecified: Secondary | ICD-10-CM | POA: Diagnosis not present

## 2020-06-01 DIAGNOSIS — Z823 Family history of stroke: Secondary | ICD-10-CM

## 2020-06-01 LAB — CBC WITH DIFFERENTIAL/PLATELET
Abs Immature Granulocytes: 0.01 10*3/uL (ref 0.00–0.07)
Basophils Absolute: 0 10*3/uL (ref 0.0–0.1)
Basophils Relative: 1 %
Eosinophils Absolute: 0 10*3/uL (ref 0.0–0.5)
Eosinophils Relative: 0 %
HCT: 43.5 % (ref 39.0–52.0)
Hemoglobin: 14.5 g/dL (ref 13.0–17.0)
Immature Granulocytes: 0 %
Lymphocytes Relative: 4 %
Lymphs Abs: 0.2 10*3/uL — ABNORMAL LOW (ref 0.7–4.0)
MCH: 31 pg (ref 26.0–34.0)
MCHC: 33.3 g/dL (ref 30.0–36.0)
MCV: 92.9 fL (ref 80.0–100.0)
Monocytes Absolute: 0.5 10*3/uL (ref 0.1–1.0)
Monocytes Relative: 13 %
Neutro Abs: 2.8 10*3/uL (ref 1.7–7.7)
Neutrophils Relative %: 82 %
Platelets: 191 10*3/uL (ref 150–400)
RBC: 4.68 MIL/uL (ref 4.22–5.81)
RDW: 13.2 % (ref 11.5–15.5)
WBC: 3.5 10*3/uL — ABNORMAL LOW (ref 4.0–10.5)
nRBC: 0 % (ref 0.0–0.2)

## 2020-06-01 LAB — COMPREHENSIVE METABOLIC PANEL
ALT: 21 U/L (ref 0–44)
AST: 29 U/L (ref 15–41)
Albumin: 4.4 g/dL (ref 3.5–5.0)
Alkaline Phosphatase: 70 U/L (ref 38–126)
Anion gap: 14 (ref 5–15)
BUN: 24 mg/dL — ABNORMAL HIGH (ref 8–23)
CO2: 30 mmol/L (ref 22–32)
Calcium: 11.1 mg/dL — ABNORMAL HIGH (ref 8.9–10.3)
Chloride: 91 mmol/L — ABNORMAL LOW (ref 98–111)
Creatinine, Ser: 1.43 mg/dL — ABNORMAL HIGH (ref 0.61–1.24)
GFR, Estimated: 50 mL/min — ABNORMAL LOW (ref >60)
Glucose, Bld: 131 mg/dL — ABNORMAL HIGH (ref 70–99)
Potassium: 3.5 mmol/L (ref 3.5–5.1)
Sodium: 135 mmol/L (ref 135–145)
Total Bilirubin: 2 mg/dL — ABNORMAL HIGH (ref 0.3–1.2)
Total Protein: 6.7 g/dL (ref 6.5–8.1)

## 2020-06-01 LAB — LIPASE, BLOOD: Lipase: 21 U/L (ref 11–51)

## 2020-06-01 LAB — RESP PANEL BY RT-PCR (FLU A&B, COVID) ARPGX2
Influenza A by PCR: NEGATIVE
Influenza B by PCR: NEGATIVE
SARS Coronavirus 2 by RT PCR: NEGATIVE

## 2020-06-01 LAB — GLUCOSE, CAPILLARY: Glucose-Capillary: 88 mg/dL (ref 70–99)

## 2020-06-01 IMAGING — CT CT ABD-PELV W/ CM
1 of 3 series · 13 of 32 positions shown, 18 images · IV contrast (APPLIED)
Comparison: [DATE]

CLINICAL DATA: Abdominal pain

EXAM:
CT ABDOMEN AND PELVIS WITH CONTRAST
TECHNIQUE: Multidetector CT imaging of the abdomen and pelvis was performed
using the standard protocol following bolus administration of
intravenous contrast.
CONTRAST:  100mL OMNIPAQUE IOHEXOL 300 MG/ML  SOLN

[Series 2: abd pel w · axial · 0.73mm/px · z∈[+650,+1100]mm · 13 of 102 slices shown, 18 images]
[im 6/102  soft-tissue]
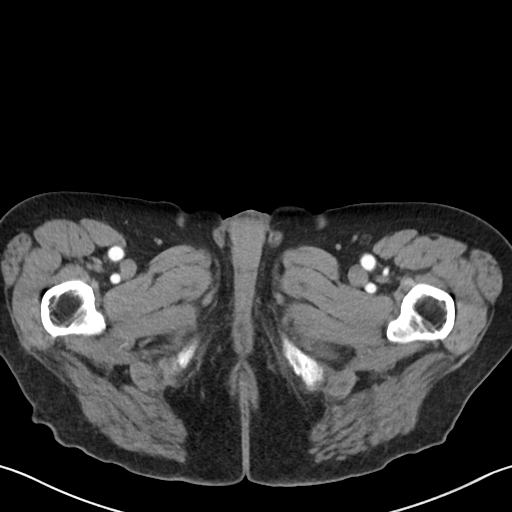
[im 6/102  bone]
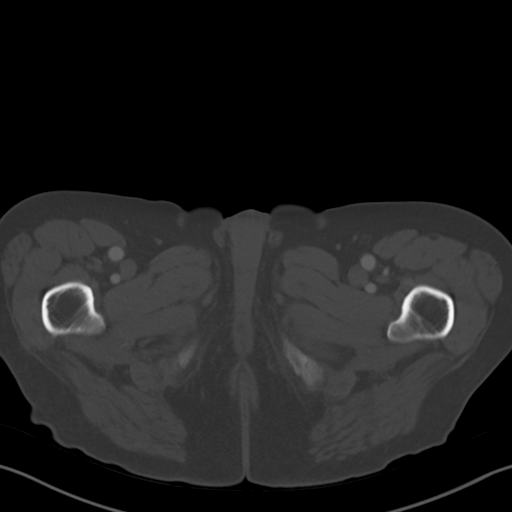
[im 17/102  soft-tissue]
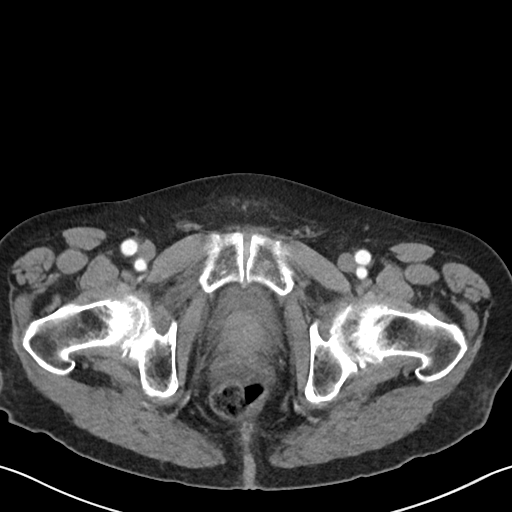
[im 23/102  soft-tissue]
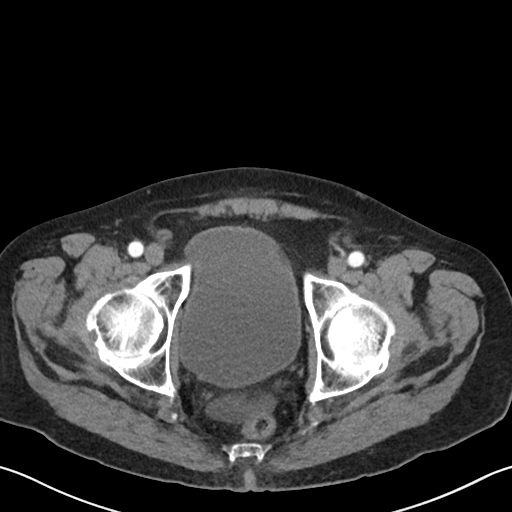
[im 29/102  soft-tissue]
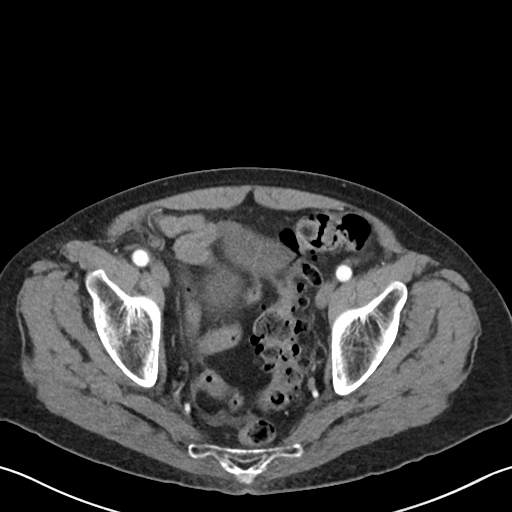
[im 40/102  soft-tissue]
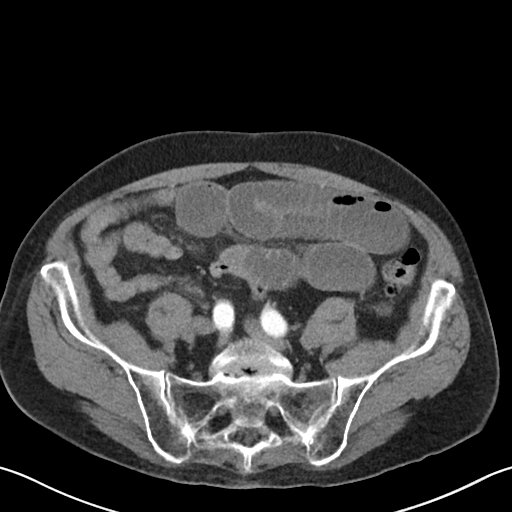
[im 45/102  soft-tissue]
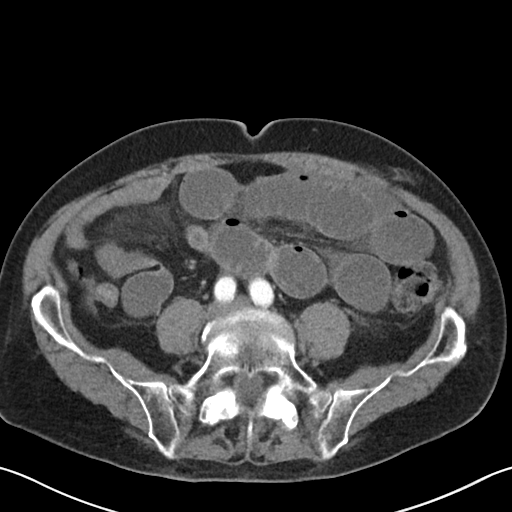
[im 57/102  soft-tissue]
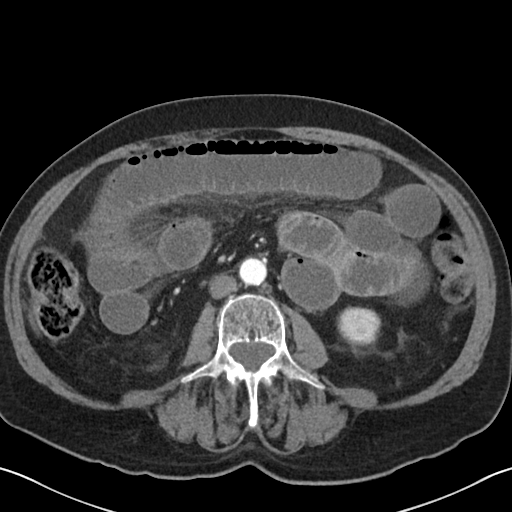
[im 62/102  soft-tissue]
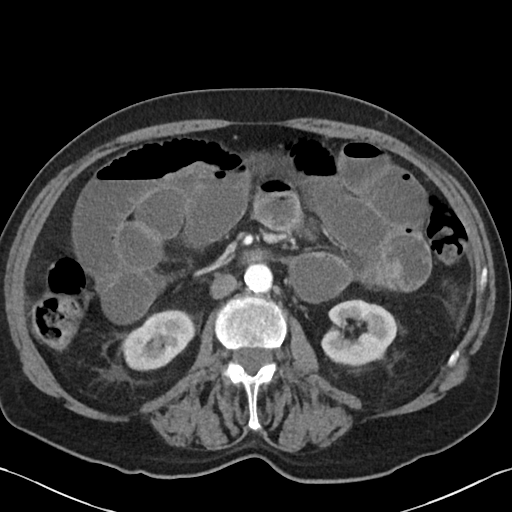
[im 73/102  soft-tissue]
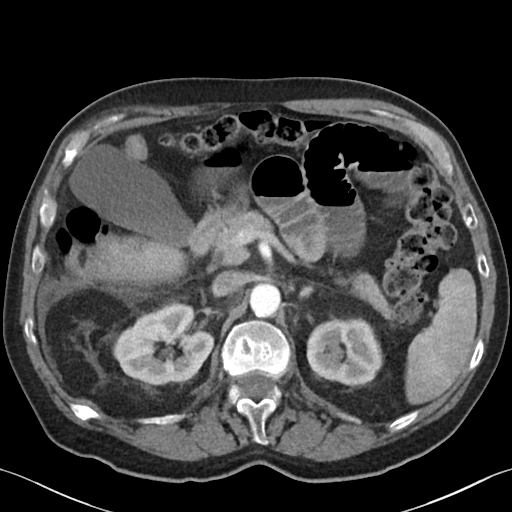
[im 73/102  bone]
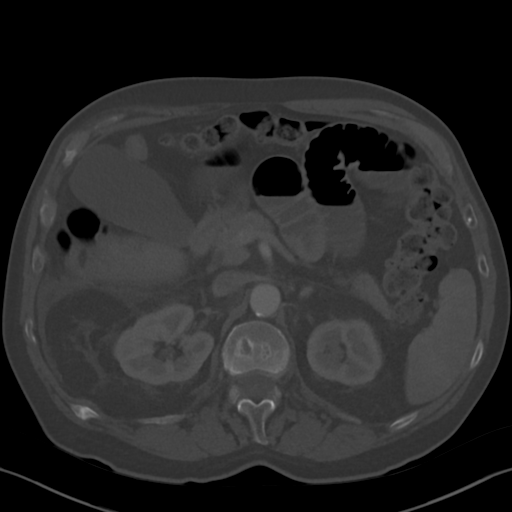
[im 79/102  soft-tissue]
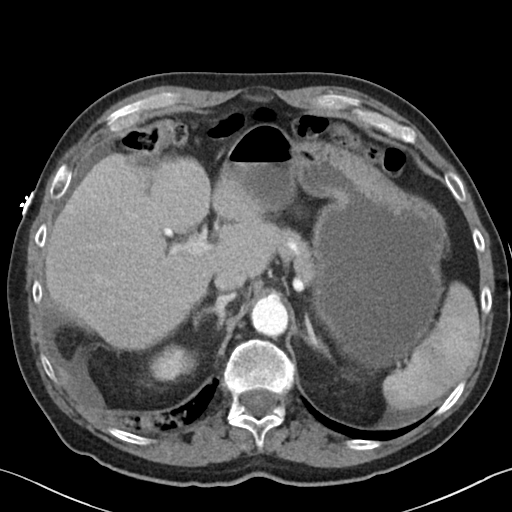
[im 79/102  lung]
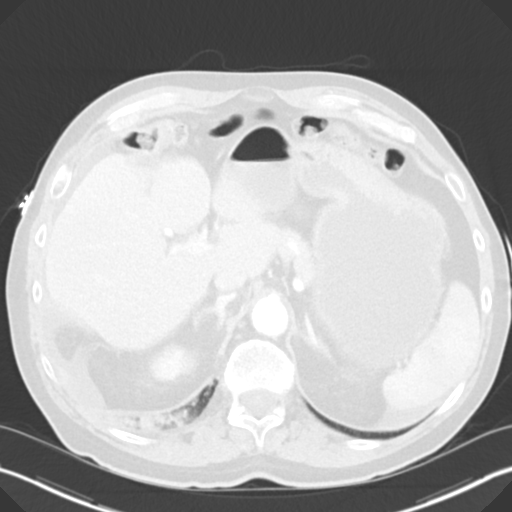
[im 85/102  soft-tissue]
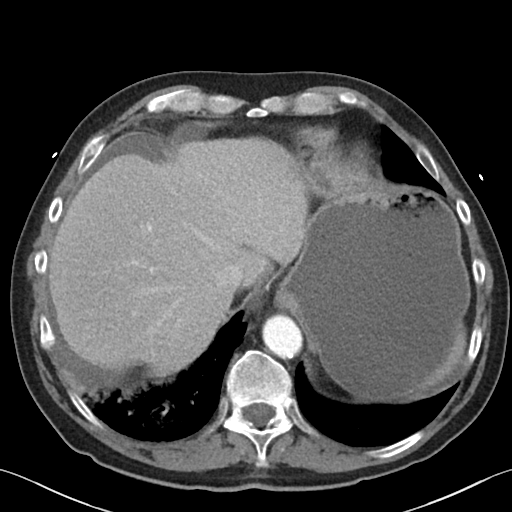
[im 85/102  lung]
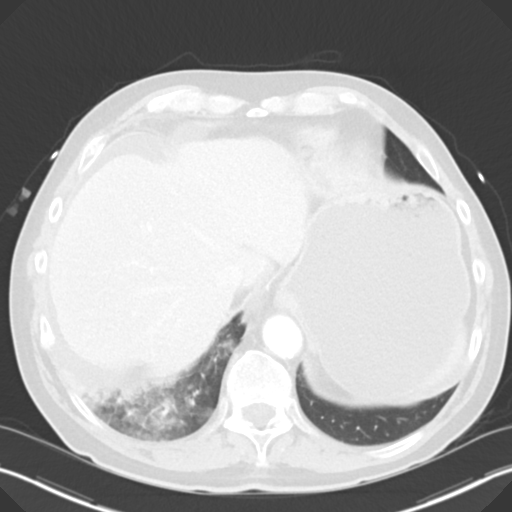
[im 90/102  lung]
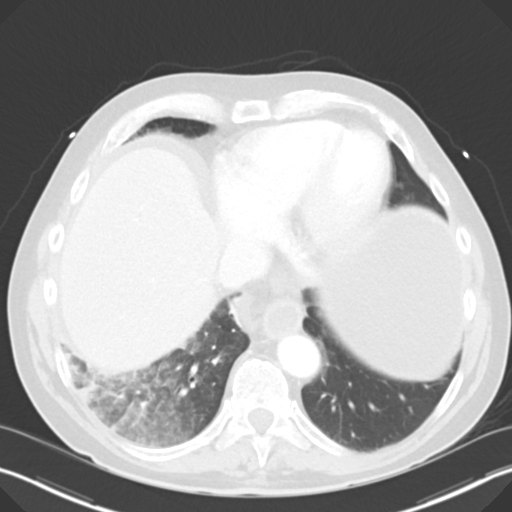
[im 96/102  soft-tissue]
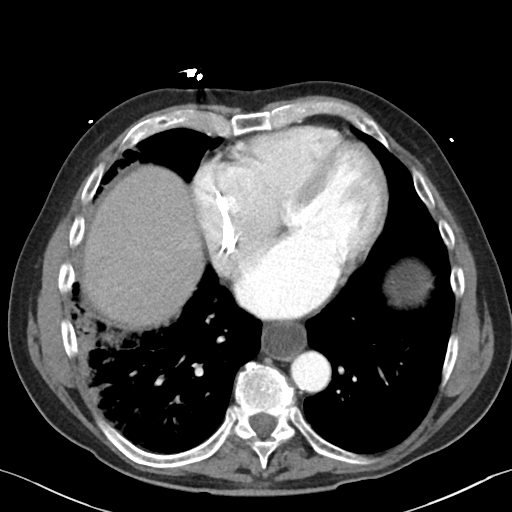
[im 96/102  lung]
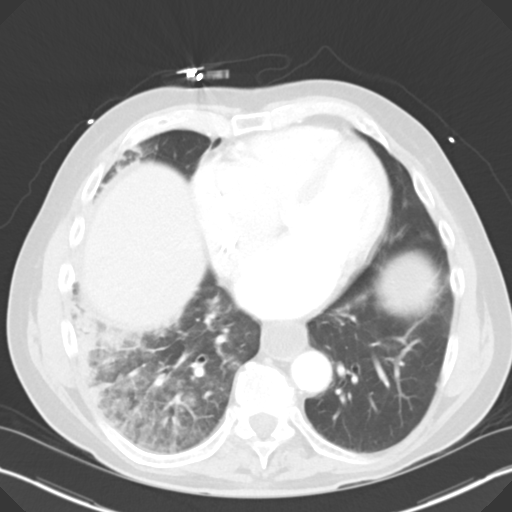

[13 of 32 positions shown; findings below may reference images not displayed]

FINDINGS: Lower chest: Extensive, predominantly ground-glass opacity
throughout the visualized right lower lobe. Left lung base is clear.
Heart size within normal limits. Fluid distension of the distal
esophagus.

Hepatobiliary: No focal liver abnormality is seen. No gallstones,
gallbladder wall thickening, or biliary dilatation.

Pancreas: Unremarkable. No pancreatic ductal dilatation or
surrounding inflammatory changes.

Spleen: Unchanged probable flash filling hemangioma within the upper
pole of the spleen. Spleen otherwise unremarkable.

Adrenals/Urinary Tract: Unremarkable adrenal glands. Kidneys enhance
symmetrically without focal lesion, stone, or hydronephrosis.
Ureters are nondilated. Urinary bladder appears unremarkable.

Stomach/Bowel: High-grade small bowel obstruction with abrupt
transition point within the right lower quadrant (series 2, image
59). Small bowel dilated up to 4.1 cm in diameter with air-fluid
levels. Stomach and distal esophagus are dilated and fluid-filled.
Distal small bowel and colon are decompressed. Extensive colonic
diverticulosis.

Vascular/Lymphatic: Scattered aortoiliac atherosclerotic
calcifications without aneurysm. No abdominopelvic lymphadenopathy.

Reproductive: Brachytherapy seeds within the prostate gland.

Other: Small volume ascites.  No pneumoperitoneum.

Musculoskeletal: No acute or significant osseous findings. Moderate
degenerative changes of the bilateral hips. Scattered lumbar
spondylosis.
IMPRESSION: 1. High-grade small bowel obstruction with abrupt transition point
within the right lower quadrant. Surgical evaluation recommended.
2. Small volume ascites, likely reactive.
3. Extensive, predominantly ground-glass opacity throughout the
visualized right lower lobe, suspicious for pneumonia. Radiographic
follow-up to resolution recommended.
4. Extensive colonic diverticulosis without evidence of acute
diverticulitis.
5. Aortic atherosclerosis ([ES]-[ES]).

These results were called by telephone at the time of interpretation
on [DATE] at [DATE] to provider VASILY , who verbally
acknowledged these results.

## 2020-06-01 IMAGING — DX DG ABD PORTABLE 1V
1 series · 1 of 1 positions shown · non-contrast
Comparison: [DATE] at [DATE] p.m.

CLINICAL DATA: Enteric catheter advancement

EXAM:
PORTABLE ABDOMEN - 1 VIEW

[abdomen kub]
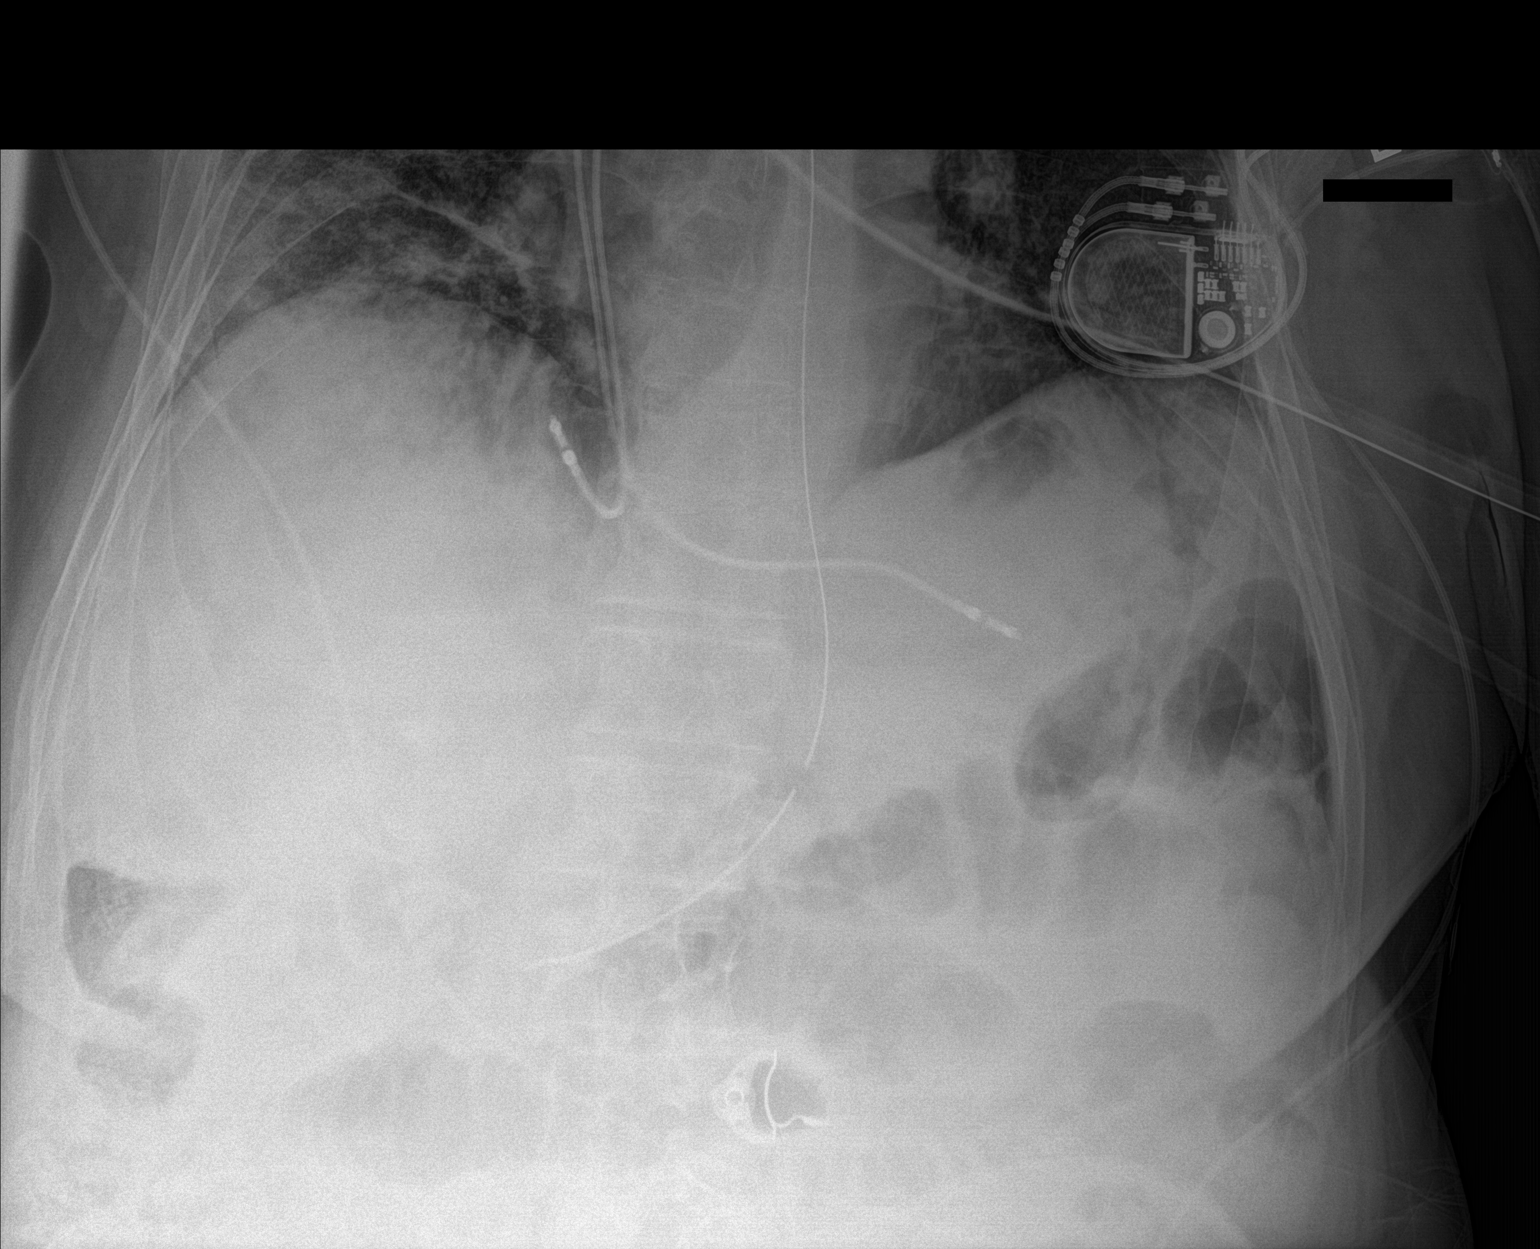

[1 of 1 positions shown; findings below may reference images not displayed]

FINDINGS: Frontal view of the lower chest and upper abdomen demonstrates
enteric catheter tip and side port projecting over the gastric
antrum. Small-bowel obstruction again noted. Areas of consolidation
are seen at the lung bases, right greater than left, stable.
IMPRESSION: 1. Enteric catheter tip and side port projecting over gastric
antrum.
2. Persistent small bowel obstruction.
3. Persistent bibasilar airspace disease.

## 2020-06-01 IMAGING — DX DG CHEST 1V PORT
1 series · 1 of 1 positions shown · non-contrast
Comparison: [DATE], [DATE]

CLINICAL DATA: Short of breath, cough

EXAM:
PORTABLE CHEST 1 VIEW

[chest ap]
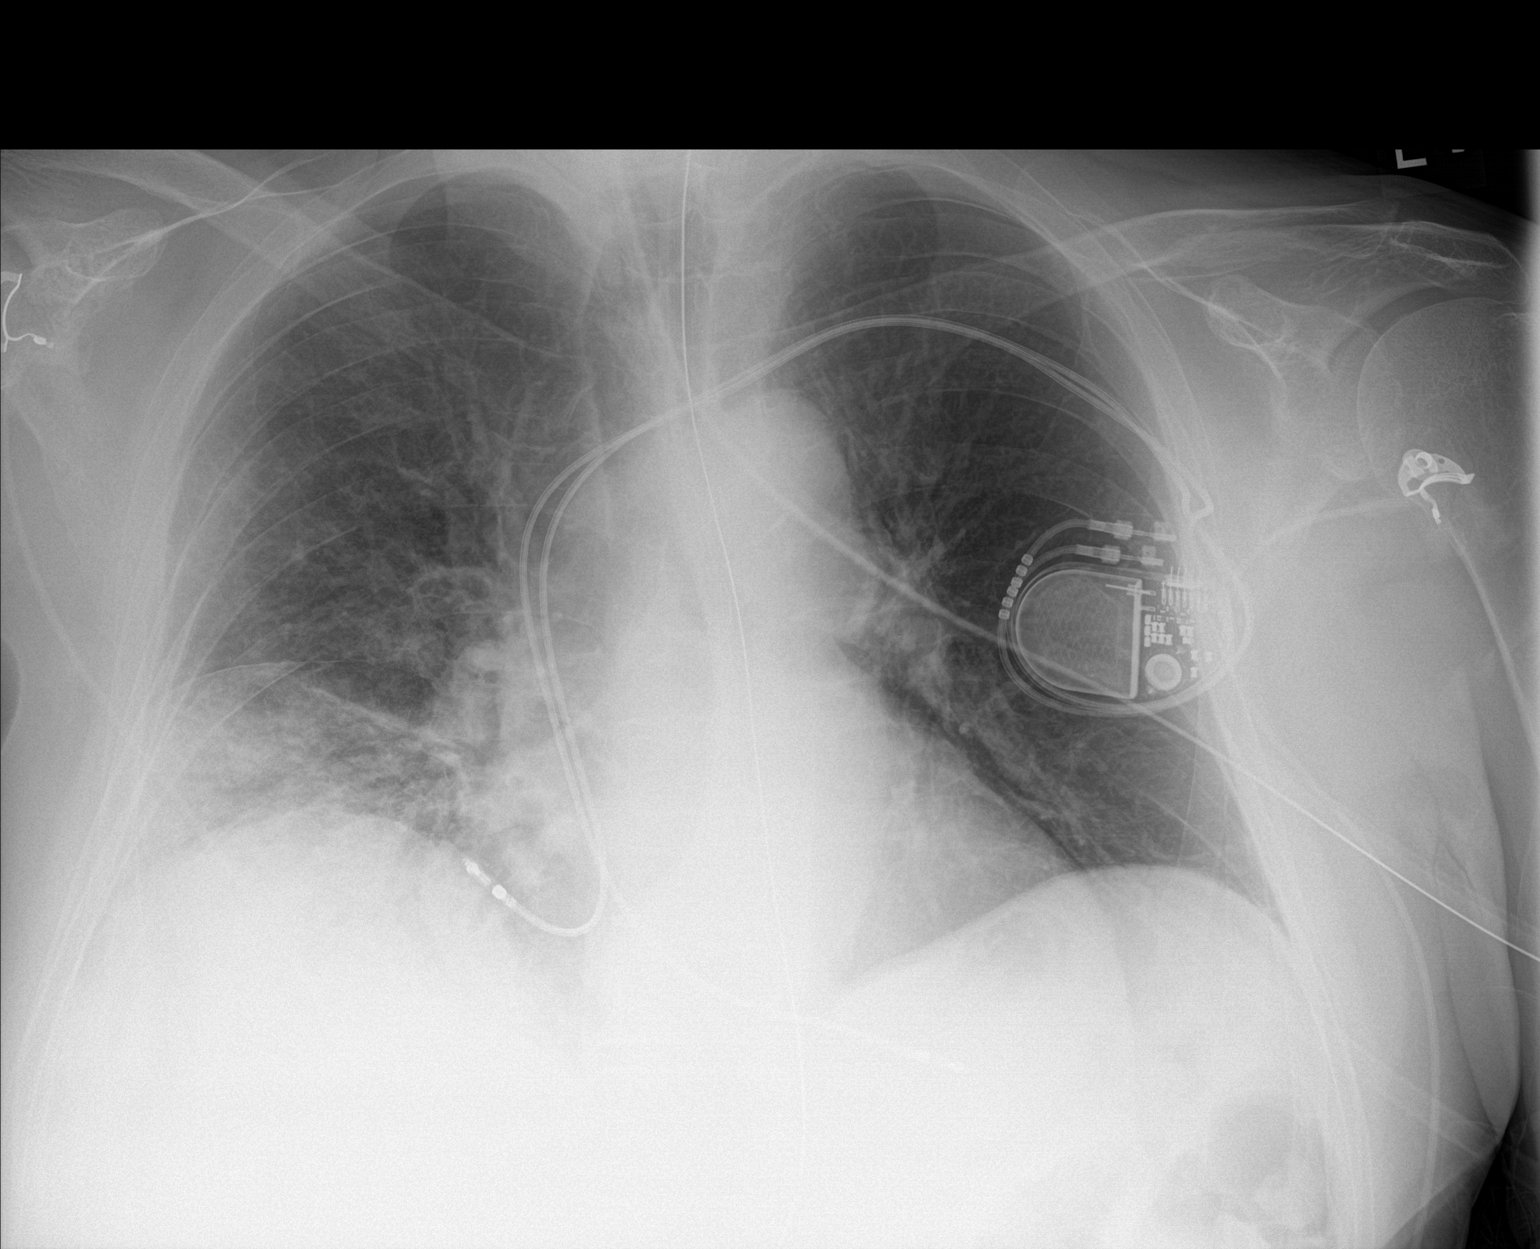

[1 of 1 positions shown; findings below may reference images not displayed]

FINDINGS: Single frontal view of the chest demonstrates enteric catheter
passing below diaphragm tip excluded by collimation. Dual lead pacer
overlies left chest. Cardiac silhouette is unremarkable. There is
persistent right basilar airspace disease unchanged since recent CT.
No effusion or pneumothorax. No acute bony abnormalities.
IMPRESSION: 1. Persistent right basilar airspace disease consistent with
pneumonia.

## 2020-06-01 IMAGING — DX DG ABDOMEN 1V
1 series · 1 of 1 positions shown · non-contrast
Comparison: [DATE] CT and prior studies

CLINICAL DATA: NG tube placement.

EXAM:
ABDOMEN - 1 VIEW

[abdomen]
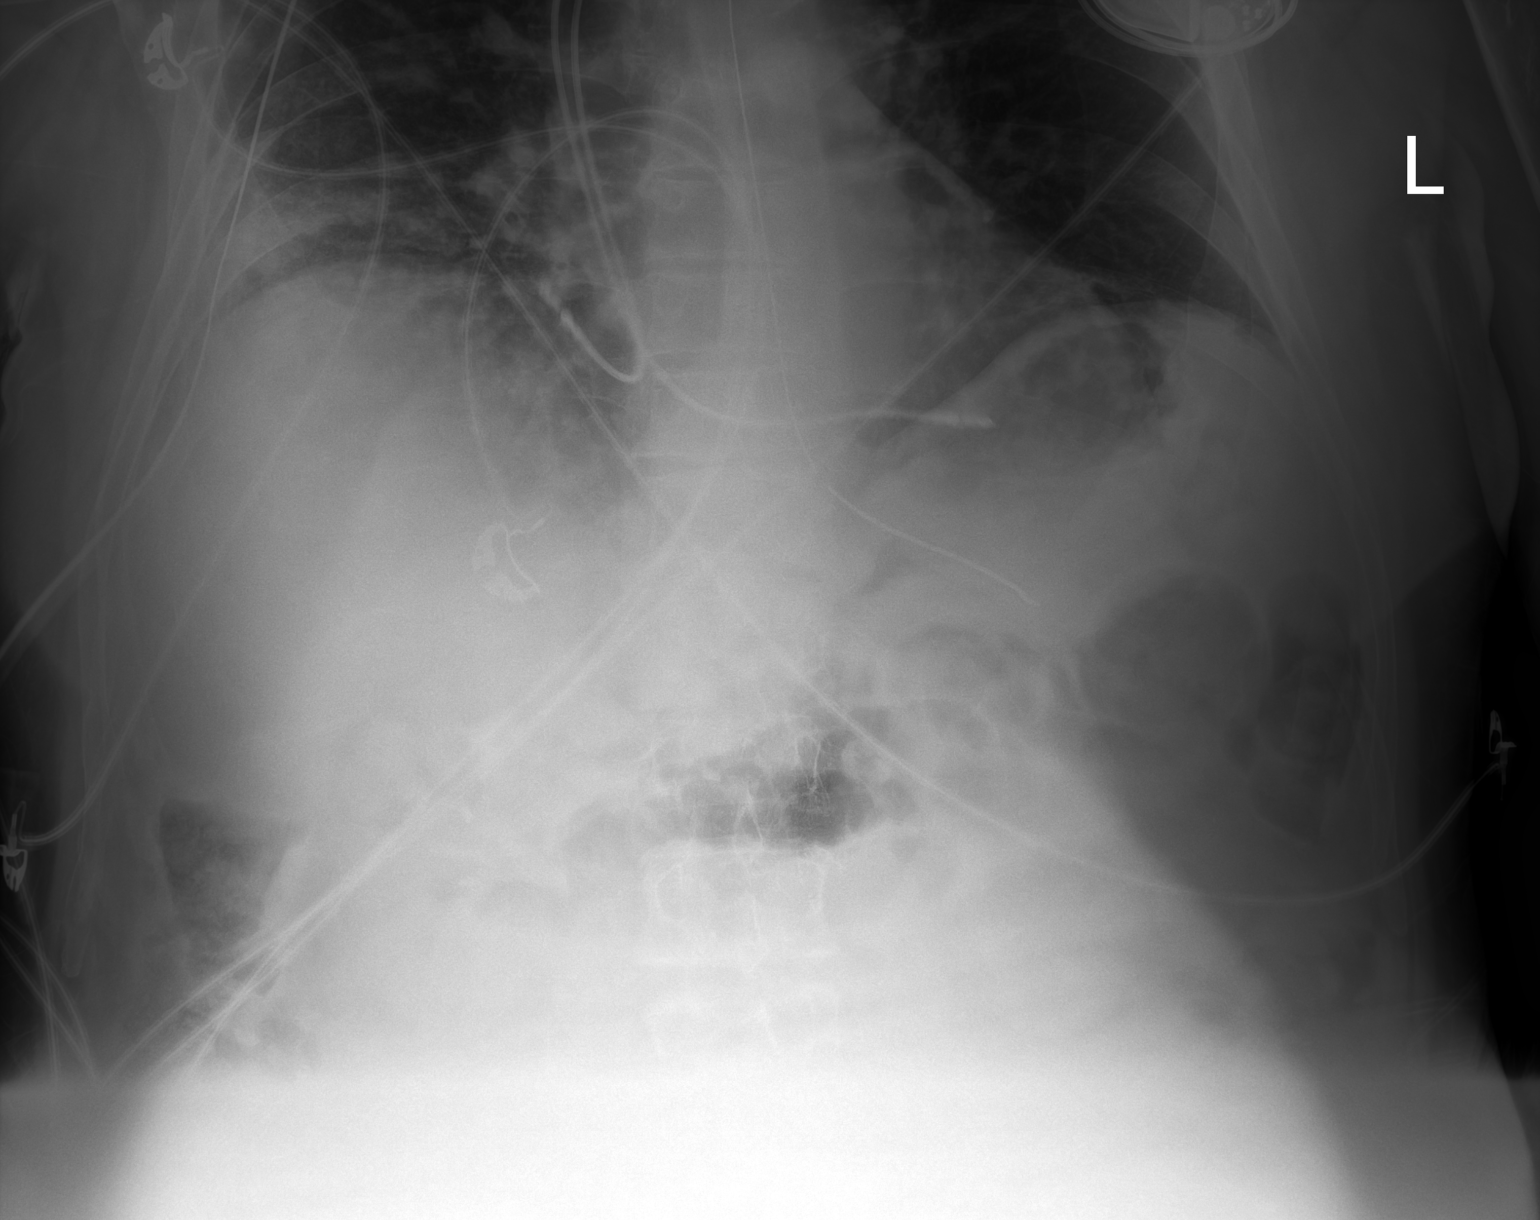

[1 of 1 positions shown; findings below may reference images not displayed]

FINDINGS: An NG tube is noted with tip overlying the proximal stomach and side
hole at the esophagogastric junction-recommend advancement.

No other changes noted.
IMPRESSION: NG tube with tip overlying the proximal stomach and side hole at the
esophagogastric junction-recommend advancement.

## 2020-06-01 MED ORDER — MORPHINE SULFATE (PF) 4 MG/ML IV SOLN
4.0000 mg | Freq: Once | INTRAVENOUS | Status: AC
  Administered 2020-06-01: 4 mg via INTRAVENOUS
  Filled 2020-06-01: qty 1

## 2020-06-01 MED ORDER — SODIUM CHLORIDE 0.9 % IV SOLN: INTRAVENOUS | Status: DC

## 2020-06-01 MED ORDER — ONDANSETRON HCL 4 MG/2ML IJ SOLN
4.0000 mg | Freq: Once | INTRAMUSCULAR | Status: AC
  Administered 2020-06-01: 4 mg via INTRAVENOUS
  Filled 2020-06-01: qty 2

## 2020-06-01 MED ORDER — SODIUM CHLORIDE 0.9 % IV BOLUS
500.0000 mL | Freq: Once | INTRAVENOUS | Status: AC
  Administered 2020-06-01: 500 mL via INTRAVENOUS

## 2020-06-01 MED ORDER — ONDANSETRON HCL 4 MG/2ML IJ SOLN
4.0000 mg | Freq: Four times a day (QID) | INTRAMUSCULAR | Status: DC | PRN
  Administered 2020-06-04: 4 mg via INTRAVENOUS
  Filled 2020-06-01 (×2): qty 2

## 2020-06-01 MED ORDER — IPRATROPIUM BROMIDE 0.02 % IN SOLN
0.5000 mg | Freq: Four times a day (QID) | RESPIRATORY_TRACT | Status: DC
  Administered 2020-06-01 – 2020-06-03 (×9): 0.5 mg via RESPIRATORY_TRACT
  Filled 2020-06-01 (×9): qty 2.5

## 2020-06-01 MED ORDER — ENOXAPARIN SODIUM 40 MG/0.4ML ~~LOC~~ SOLN
40.0000 mg | SUBCUTANEOUS | Status: DC
  Administered 2020-06-01 – 2020-06-04 (×3): 40 mg via SUBCUTANEOUS
  Filled 2020-06-01 (×3): qty 0.4

## 2020-06-01 MED ORDER — ACETAMINOPHEN 325 MG PO TABS
650.0000 mg | ORAL_TABLET | Freq: Four times a day (QID) | ORAL | Status: DC | PRN
  Administered 2020-06-01: 650 mg via ORAL
  Filled 2020-06-01: qty 2

## 2020-06-01 MED ORDER — HYDROMORPHONE HCL 1 MG/ML IJ SOLN
0.5000 mg | Freq: Once | INTRAMUSCULAR | Status: AC
  Administered 2020-06-01: 0.5 mg via INTRAVENOUS
  Filled 2020-06-01: qty 1

## 2020-06-01 MED ORDER — SODIUM CHLORIDE 0.9 % IV BOLUS
1000.0000 mL | Freq: Once | INTRAVENOUS | Status: AC
  Administered 2020-06-01: 1000 mL via INTRAVENOUS

## 2020-06-01 MED ORDER — HYDRALAZINE HCL 20 MG/ML IJ SOLN
10.0000 mg | Freq: Four times a day (QID) | INTRAMUSCULAR | Status: DC | PRN
Start: 2020-06-01 — End: 2020-06-06
  Administered 2020-06-04 – 2020-06-05 (×2): 10 mg via INTRAVENOUS
  Filled 2020-06-01 (×2): qty 1

## 2020-06-01 MED ORDER — IOHEXOL 300 MG/ML  SOLN
100.0000 mL | Freq: Once | INTRAMUSCULAR | Status: AC | PRN
  Administered 2020-06-01: 100 mL via INTRAVENOUS

## 2020-06-01 MED ORDER — ONDANSETRON HCL 4 MG PO TABS: 4.0000 mg | ORAL_TABLET | Freq: Four times a day (QID) | ORAL | Status: DC | PRN

## 2020-06-01 MED ORDER — PANTOPRAZOLE SODIUM 40 MG IV SOLR
40.0000 mg | Freq: Every day | INTRAVENOUS | Status: DC
  Administered 2020-06-01 – 2020-06-05 (×5): 40 mg via INTRAVENOUS
  Filled 2020-06-01 (×5): qty 40

## 2020-06-01 MED ORDER — LEVALBUTEROL HCL 0.63 MG/3ML IN NEBU
0.6300 mg | INHALATION_SOLUTION | Freq: Four times a day (QID) | RESPIRATORY_TRACT | Status: DC
  Administered 2020-06-01 – 2020-06-03 (×9): 0.63 mg via RESPIRATORY_TRACT
  Filled 2020-06-01 (×9): qty 3

## 2020-06-01 MED ORDER — LIDOCAINE HCL URETHRAL/MUCOSAL 2 % EX GEL
1.0000 "application " | Freq: Once | CUTANEOUS | Status: AC
  Administered 2020-06-01: 1 via TOPICAL
  Filled 2020-06-01: qty 11

## 2020-06-01 MED ORDER — DIATRIZOATE MEGLUMINE & SODIUM 66-10 % PO SOLN
90.0000 mL | Freq: Once | ORAL | Status: AC
  Administered 2020-06-01: 90 mL via NASOGASTRIC
  Filled 2020-06-01: qty 90

## 2020-06-01 MED ORDER — MORPHINE SULFATE (PF) 2 MG/ML IV SOLN
2.0000 mg | INTRAVENOUS | Status: DC | PRN
  Administered 2020-06-01 – 2020-06-03 (×10): 2 mg via INTRAVENOUS
  Filled 2020-06-01 (×11): qty 1

## 2020-06-01 MED ORDER — SODIUM CHLORIDE 0.9 % IV SOLN
3.0000 g | Freq: Four times a day (QID) | INTRAVENOUS | Status: AC
  Administered 2020-06-01 – 2020-06-05 (×15): 3 g via INTRAVENOUS
  Filled 2020-06-01 (×8): qty 3
  Filled 2020-06-01: qty 8
  Filled 2020-06-01: qty 3
  Filled 2020-06-01 (×2): qty 8
  Filled 2020-06-01 (×5): qty 3

## 2020-06-01 MED ORDER — ACETAMINOPHEN 650 MG RE SUPP: 650.0000 mg | Freq: Four times a day (QID) | RECTAL | Status: DC | PRN

## 2020-06-01 NOTE — ED Notes (Signed)
I have called report to [facility] and to [place], Therapist, sports at [facility]. Pt. Is now resting comfortably in the presence of his wife.

## 2020-06-01 NOTE — ED Notes (Signed)
With some difficulty, I am able to place a #16 N.G. tube in right nare after being unable in the left nare. The N.G. tube immediately returns 1362ml of green/brown bilious material.

## 2020-06-01 NOTE — Plan of Care (Signed)

## 2020-06-01 NOTE — H&P (Signed)
History and Physical    Todd Chapman DJS:[id number] DOB: 01/14/1946 DOA: 06/01/2020  PCP: [name], MD   Patient coming from: Home  Chief Complaint: Nausea and Vomiting, Abdominal Pain   HPI: Todd Chapman is a 75 y.o. 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  Patient states that since January he is bowels have not been right and recently seen in the ED for ileitis and was placed on levofloxacin and metronidazole at that time.  He has been having chronic diarrhea however last night he was unable to defecate and started becoming extremely nauseous today vomiting.  Wife states that he started vomiting again this morning it was projectile vomiting.  Because of symptoms he was brought to the emergency room and was found to have a high-grade small bowel obstruction as well as an aspiration pneumonia.  He still describes diffuse abdominal pain but states is better with the pain regimen.  He complains of some congestion and some mild shortness of breath especially when he walks.  Denies any other concerns or points at this time.  TR[facility] was asked to admit this patient for the above symptoms  ED Course: In the ED patient had basic blood work done and had a CT of the abdomen pelvis.  He had NG tube placed and was transferred to [facility] of note Covid testing was negative.  General surgery was consulted  Review of Systems: As per HPI otherwise all other systems reviewed and negative.   Past Medical History:  Diagnosis Date   Arthritis    "thumbs, back" (02/16/2017)   Bradycardia 02/2017   Dyspnea    with exertion   Flu 2 weeks ago   GERD  (gastroesophageal reflux disease)    Hepatitis A as child   HLD (hyperlipidemia)    Persistent atrial fibrillation ([place]) 12/18/2016   Pneumonia 1999; 2018   Presence of permanent cardiac pacemaker    st jude   Prostate cancer [facility])    Past Surgical History:  Procedure Laterality Date   APPENDECTOMY     age 32   CARDIOVERSION  02/16/2017   CARDIOVERSION N/A 02/16/2017   Procedure: CARDIOVERSION;  Surgeon: Thompson Grayer, MD;  Location: [facility];  Service: Cardiovascular;  Laterality: N/A;   COLONOSCOPY WITH PROPOFOL N/A 12/13/2012   Procedure: COLONOSCOPY WITH PROPOFOL;  Surgeon: Garlan Fair, MD;  Location: [facility];  Service: Endoscopy;  Laterality: N/A;   FRACTURE SURGERY     HYDROCELE EXCISION Left 05/16/2018   Procedure: HYDROCELECTOMY ADULT;  Surgeon: [name], MD;  Location: [facility];  Service: Urology;  Laterality: Left;   INSERT / REPLACE / REMOVE PACEMAKER  02/16/2017   PACEMAKER IMPLANT N/A 02/16/2017   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 His smoking use included cigarettes. He has a 0.96 pack-year smoking history. He has never used smokeless tobacco. He reports current alcohol use. He reports that he does not use drugs.  Allergies  Allergen Reactions   Atorvastatin  Palpitations   Ezetimibe Palpitations    Family History  Problem Relation Age of Onset   CVA Mother    Coronary artery disease Mother    Stroke Mother    Coronary artery disease Father    Heart attack Father    Stomach cancer Maternal Grandmother    Breast cancer Neg Hx    Prostate cancer Neg Hx    Colon cancer Neg Hx    Pancreatic cancer Neg Hx    Prior to Admission medications   Medication Sig Start Date End Date  Taking? Authorizing Provider  acetaminophen (TYLENOL) 500 MG tablet Take 500 mg by mouth daily as needed for mild pain.    [provider]  ELIQUIS 5 MG TABS tablet TAKE 1 TABLET(5 MG) BY MOUTH TWICE DAILY 03/04/20   Allred, Jeneen Rinks, MD  loratadine (CLARITIN) 10 MG tablet Take 10 mg by mouth daily as needed for allergies.    [provider]  Multiple Vitamin (MULTIVITAMIN WITH MINERALS) TABS tablet Take 1 tablet by mouth daily.    [provider]  Multiple Vitamins-Minerals (EYE VITAMINS) CAPS Take 1 capsule by mouth daily.     [provider]  mupirocin ointment (BACTROBAN) 2 % Place 1 application into the nose daily as needed (dry nose).    [provider]  ondansetron (ZOFRAN ODT) 4 MG disintegrating tablet Take 1 tablet (4 mg total) by mouth every 4 (four) hours as needed for nausea or vomiting. 04/09/20   Charlesetta Shanks, MD  oxyCODONE-acetaminophen (PERCOCET) 5-325 MG tablet Take 1-2 tablets by mouth every 4 (four) hours as needed. 04/09/20   Charlesetta Shanks, MD  pantoprazole (PROTONIX) 40 MG tablet Take 40 mg daily by mouth. 10/16/16   [provider]  rosuvastatin (CRESTOR) 10 MG tablet Take 10 mg by mouth every morning.    [provider]  tamsulosin (FLOMAX) 0.4 MG CAPS capsule Take 1 capsule (0.4 mg total) by mouth daily after supper. 04/18/20   Tyler Pita, MD   Physical Exam: Vitals:   06/01/20 1546 06/01/20 1600 06/01/20 1630 06/01/20 1742  BP:  94/67 (!) 86/65 97/65  Pulse:  83 76 85  Resp:  (!) 21 15 18   Temp:    98.3 F (36.8 C)  TempSrc:    Oral  SpO2: 90% 100% 97% 98%  Weight:      Height:        Constitutional: WN/WD overweight Caucasian male currently in no acute distress appears slightly uncomfortable and has NG tube in place Eyes: Lids and conjunctivae normal, sclerae anicteric  ENMT: External Ears, Nose appear normal. Grossly normal hearing.  NG tube in place the right nare Neck: Appears normal, supple,  no cervical masses, normal ROM, no appreciable thyromegaly Respiratory: Diminished to auscultation bilaterally with coarse breath sounds worse on the right compared to left and has some mild rhonchi and some slight crackles. Normal respiratory effort and patient is not tachypenic. No accessory muscle use.  Wearing supplemental oxygen via nasal cannula Cardiovascular: Irregularly irregular, no murmurs / rubs / gallops. S1 and S2 auscultated.  Trace extremity edema Abdomen: Soft, Tender to palpate, Distended 2/2 to body habitus. Bowel sounds positive.  GU: Deferred. Musculoskeletal: No clubbing / cyanosis of digits/nails. No joint deformity upper and lower extremities.  Skin: No rashes, lesions, ulcers on a limited skin evaluation. No induration; Warm and dry.  Neurologic: CN 2-12 grossly intact with no focal deficits. Romberg sign and cerebellar reflexes not assessed.  Psychiatric: Normal judgment and insight.  Alert and oriented x 3. Anxious mood and appropriate affect.   Labs on Admission: I have personally reviewed following labs and imaging studies  CBC: Recent Labs  Lab 06/01/20 1307  WBC 3.5*  NEUTROABS 2.8  HGB 14.5  HCT 43.5  MCV 92.9  PLT 161   Basic Metabolic Panel: Recent Labs  Lab 06/01/20 1307  NA 135  K 3.5  CL 91*  CO2 30  GLUCOSE 131*  BUN 24*  CREATININE 1.43*  CALCIUM 11.1*   GFR: Estimated Creatinine Clearance: 50.3 mL/min (A) (by C-G formula based on SCr of 1.43 mg/dL (H)). Liver Function Tests: Recent Labs  Lab 06/01/20 1307  AST 29  ALT 21  ALKPHOS 70  BILITOT 2.0*  PROT 6.7  ALBUMIN 4.4   Recent Labs  Lab 06/01/20 1307  LIPASE 21   No results for input(s): AMMONIA in the last 168 hours. Coagulation Profile: No results for input(s): INR, PROTIME in the last 168 hours. Cardiac Enzymes: No results for input(s): CKTOTAL, CKMB, CKMBINDEX, TROPONINI in the last 168 hours. BNP (last 3 results) No results for input(s): PROBNP in the last 8760  hours. HbA1C: No results for input(s): HGBA1C in the last 72 hours. CBG: No results for input(s): GLUCAP in the last 168 hours. Lipid Profile: No results for input(s): CHOL, HDL, LDLCALC, TRIG, CHOLHDL, LDLDIRECT in the last 72 hours. Thyroid Function Tests: No results for input(s): TSH, T4TOTAL, FREET4, T3FREE, THYROIDAB in the last 72 hours. Anemia Panel: No results for input(s): VITAMINB12, FOLATE, FERRITIN, TIBC, IRON, RETICCTPCT in the last 72 hours. Urine analysis:    Component Value Date/Time   COLORURINE YELLOW 04/09/2020 [place] 04/09/2020 0958   LABSPEC 1.025 04/09/2020 0958   PHURINE 5.0 04/09/2020 0958   GLUCOSEU NEGATIVE 04/09/2020 0958   HGBUR NEGATIVE 04/09/2020 0958   BILIRUBINUR NEGATIVE 04/09/2020 0958   KETONESUR 5 (A) 04/09/2020 0958   PROTEINUR NEGATIVE 04/09/2020 0958   NITRITE NEGATIVE 04/09/2020 0958   LEUKOCYTESUR NEGATIVE 04/09/2020 0958   Sepsis Labs: !!!!!!!!!!!!!!!!!!!!!!!!!!!!!!!!!!!!!!!!!!!! @LABRCNTIP (procalcitonin:4,lacticidven:4) ) Recent Results (from the past 240 hour(s))  Resp Panel by RT-PCR (Flu A&B, Covid) Nasopharyngeal Swab     Status: None   Collection Time: 06/01/20  3:38 PM   Specimen: Nasopharyngeal Swab; Nasopharyngeal(NP) swabs in vial transport medium  Result Value Ref Range Status   SARS Coronavirus 2 by RT PCR NEGATIVE NEGATIVE Final    Comment: (NOTE) SARS-CoV-2 target nucleic acids are NOT DETECTED.  The SARS-CoV-2 RNA is generally detectable in upper respiratory specimens during the acute phase of infection. The lowest concentration of SARS-CoV-2 viral copies this assay can detect is 138 copies/mL. A negative result does not preclude SARS-Cov-2 infection and should not be used as the sole basis for treatment or other patient management decisions. A negative result may occur with  improper specimen collection/handling, submission of specimen other than nasopharyngeal swab, presence of viral mutation(s)  within the areas targeted by this assay, and inadequate number of viral copies(<138 copies/mL). A negative result must be combined with clinical observations, patient history, and epidemiological information. The expected result is Negative.  Fact Sheet for Patients:  EntrepreneurPulse.com.au  Fact Sheet for Healthcare Providers:  IncredibleEmployment.be  This test is no t yet approved or cleared by the [place] FDA and  has been authorized for detection and/or diagnosis of SARS-CoV-2 by FDA under an Emergency Use Authorization (EUA). This EUA will remain  in effect (meaning this test can be used) for the duration of the  COVID-19 declaration under Section 564(b)(1) of the Act, 21 U.S.C.section 360bbb-3(b)(1), unless the authorization is terminated  or revoked sooner.       Influenza A by PCR NEGATIVE NEGATIVE Final   Influenza B by PCR NEGATIVE NEGATIVE Final    Comment: (NOTE) The Xpert Xpress SARS-CoV-2/FLU/RSV plus assay is intended as an aid in the diagnosis of influenza from Nasopharyngeal swab specimens and should not be used as a sole basis for treatment. Nasal washings and aspirates are unacceptable for Xpert Xpress SARS-CoV-2/FLU/RSV testing.  Fact Sheet for Patients: EntrepreneurPulse.com.au  Fact Sheet for Healthcare Providers: IncredibleEmployment.be  This test is not yet approved or cleared by the [place] FDA and has been authorized for detection and/or diagnosis of SARS-CoV-2 by FDA under an Emergency Use Authorization (EUA). This EUA will remain in effect (meaning this test can be used) for the duration of the COVID-19 declaration under Section 564(b)(1) of the Act, 21 U.S.C. section 360bbb-3(b)(1), unless the authorization is terminated or revoked.  Performed at [facility]     Radiological Exams on Admission: DG Abd 1 View  Result Date:  06/01/2020 CLINICAL DATA:  NG tube placement. EXAM: ABDOMEN - 1 VIEW COMPARISON:  06/01/2020 CT and prior studies FINDINGS: An NG tube is noted with tip overlying the proximal stomach and side hole at the esophagogastric junction-recommend advancement. No other changes noted. IMPRESSION: NG tube with tip overlying the proximal stomach and side hole at the esophagogastric junction-recommend advancement. Electronically Signed   By: Margarette Canada M.D.   On: 06/01/2020 16:14   CT Abdomen Pelvis W Contrast  Result Date: 06/01/2020 CLINICAL DATA:  Abdominal pain EXAM: CT ABDOMEN AND PELVIS WITH CONTRAST TECHNIQUE: Multidetector CT imaging of the abdomen and pelvis was performed using the standard protocol following bolus administration of intravenous contrast. CONTRAST:  134mL OMNIPAQUE IOHEXOL 300 MG/ML  SOLN COMPARISON:  04/09/2020 FINDINGS: Lower chest: Extensive, predominantly ground-glass opacity throughout the visualized right lower lobe. Left lung base is clear. Heart size within normal limits. Fluid distension of the distal esophagus. Hepatobiliary: No focal liver abnormality is seen. No gallstones, gallbladder wall thickening, or biliary dilatation. Pancreas: Unremarkable. No pancreatic ductal dilatation or surrounding inflammatory changes. Spleen: Unchanged probable flash filling hemangioma within the upper pole of the spleen. Spleen otherwise unremarkable. Adrenals/Urinary Tract: Unremarkable adrenal glands. Kidneys enhance symmetrically without focal lesion, stone, or hydronephrosis. Ureters are nondilated. Urinary bladder appears unremarkable. Stomach/Bowel: High-grade small bowel obstruction with abrupt transition point within the right lower quadrant (series 2, image 59). Small bowel dilated up to 4.1 cm in diameter with air-fluid levels. Stomach and distal esophagus are dilated and fluid-filled. Distal small bowel and colon are decompressed. Extensive colonic diverticulosis. Vascular/Lymphatic: Scattered  aortoiliac atherosclerotic calcifications without aneurysm. No abdominopelvic lymphadenopathy. Reproductive: Brachytherapy seeds within the prostate gland. Other: Small volume ascites.  No pneumoperitoneum. Musculoskeletal: No acute or significant osseous findings. Moderate degenerative changes of the bilateral hips. Scattered lumbar spondylosis. IMPRESSION: 1. High-grade small bowel obstruction with abrupt transition point within the right lower quadrant. Surgical evaluation recommended. 2. Small volume ascites, likely reactive. 3. Extensive, predominantly ground-glass opacity throughout the visualized right lower lobe, suspicious for pneumonia. Radiographic follow-up to resolution recommended. 4. Extensive colonic diverticulosis without evidence of acute diverticulitis. 5. Aortic atherosclerosis (ICD10-I70.0). These results were called by telephone at the time of interpretation on 06/01/2020 at 2:29 pm to provider MELANIE BELFI , who verbally acknowledged these results. Electronically Signed   By: Davina Poke D.O.   On: 06/01/2020 14:30  EKG: No EKG Done on Admission so will order one now  Assessment/Plan Active Problems:   SBO (small bowel obstruction) (HCC)  High-Grade SBO, poA -Has a Hx of appendectomy but recently has been getting radiation treatment for prostate cancer -CT Scan shows abrupt Transition point in the RLQ -NG Placed and DG Abdomen done and showed "NG tube with tip overlying the proximal stomach and side hole at the esophagogastric junction-recommend advancement.";  [facility] and will be obtaining repeat DG Abdomen -Had 1300 mL out when NG was palced -NPO and Supportive Care -C/w Antiementics -Ensure Mag >2.0 and K+ >4.0 -Bolus 1 Liter now and Start Maintenance IVF with NS at 75 mL/hr -Pain Control with IV Morphine 2 mg q2h -Antiemetics with IV Ondansetron -Acetaminophen 650  -Will consult General Surgery for further evaluation and  Recc's  -Surgery is initiating small bowel protocol currently -Hold off on all p.o. medications and hold off his anticoagulation  RLL PNA, poA likley in the setting of Aspiration from Nausea and Vomiting Acute respiratory failure with hypoxia in the setting of Aspiration Pneumonia -Patient has been nauseous and vomiting severely with projectile vomiting -Likely aspirated -Aspiration precautions -Start Xopenex and Atrovent every 6 scheduled and also order flutter valve, incentive spirometry -Check strep and Legionella urine antigens -Empirically start antibiotics with IV Unasyn -Was wearing 2 L of supplemental oxygen -SpO2: 98 % O2 Flow Rate (L/min): 2 L/min -Continue to Monitor Respiratory Status Carefully -Obtain CXR now   Prostate Cancer -Holding Tamsulosin as he is NPO and resume when tolerating Diet -Recently completed Radiation Tx 11 Days ago -Will need to Follow up with Radiation Onc and Urology as an outpatient   AKI -The setting of dehydration as patient's BUNs/creatinine is 24/1.43 -IVF Hydration as above -Avoid Nephrotoxic Medications, Contrast Dyes, Hypotension and Renally Adjust Medications -Continue to Monitor and Trend Renal Fxn Carefully  -Repeat CMP in the AM   Hyperbilirubinemia -Patient's T Bili is 2.0 -IVF Hydration as above -Continue to Monitor and Trend -Repeat CMP in the AM  Persistent Atrial Fibrillation -Continue to Monitor on Telemetry   GERD -Change po PPI to IV Pantoprazole 40 mg po q24h  HLD -Hold Statin while NPO   DVT prophylaxis: Hold Home Eliquis; Start Enoxaparin 40 mg sq q24h Code Status: FULL CODE Family Communication: Discussed with Wife at Bedside  Disposition Plan: Pending further clinical improvement  Consults called: General Surgery Dr. Nedra Hai Admission status: Inpatient Telemetry   Status is: Inpatient  Remains inpatient appropriate because:Unsafe d/c plan, IV treatments appropriate due to intensity of illness or  inability to take PO and Inpatient level of care appropriate due to severity of illness   Dispo: The patient is from: Home              Anticipated d/c is to: Home              Patient currently is not medically stable to d/c.   Difficult to place patient No   Severity of Illness: The appropriate patient status for this patient is INPATIENT. Inpatient status is judged to be reasonable and necessary in order to provide the required intensity of service to ensure the patient's safety. The patient's presenting symptoms, physical exam findings, and initial radiographic and laboratory data in the context of their chronic comorbidities is felt to place them at high risk for further clinical deterioration. Furthermore, it is not anticipated that the patient will be medically stable for  discharge from the hospital within 2 midnights of admission. The following factors support the patient status of inpatient.   " The patient's presenting symptoms include Abdominal Pain, Nausea, Vomiting. " The worrisome physical exam findings include Diminished breath sounds, Tender abdomen " The initial radiographic and laboratory data are worrisome because of Pneumonia and SBO. " The chronic co-morbidities are listed as above   * I certify that at the point of admission it is my clinical judgment that the patient will require inpatient hospital care spanning beyond 2 midnights from the point of admission due to high intensity of service, high risk for further deterioration and high frequency of surveillance required.Kerney Elbe, D.O. [facility] is on [place]  If 7PM-7AM, please contact night-coverage [email or web address]  06/01/2020, 5:43 PM

## 2020-06-01 NOTE — ED Notes (Signed)
ED Provider at bedside. 

## 2020-06-01 NOTE — ED Provider Notes (Signed)
[facility] EMERGENCY DEPT Provider Note   CSN: [id number] Arrival date & time: 06/01/20  1249     History Chief Complaint  Patient presents with  . Emesis  . Constipation    Todd Chapman is a 75 y.o. male.  Patient is a 75 year old male with a recent history of prostate cancer.  He recently finished radiation treatments.  He has been having some issues with his stools since that time during the radiation treatments.  He has been alternating between diarrhea and constipation.  He says he has not had a really good bowel movement since December.  He was diagnosed with ileitis in February of this year and was treated with antibiotics.  He says since yesterday has had some worsening abdominal pain.  He describes as a crampy diffuse rather constant pain.  It waxes and wanes in intensity.  He had a small bowel movement yesterday.  He started having some nausea and vomiting today.  No fevers.  His pain got more intense today.  He tried mag citrate and enema last night without improvement in symptoms.  He does not report any significant rectal pressure.  No urinary symptoms other than his baseline frequency issues.        Past Medical History:  Diagnosis Date  . Arthritis    "thumbs, back" (02/16/2017)  . Bradycardia 02/2017  . Dyspnea    with exertion  . Flu 2 weeks ago  . GERD (gastroesophageal reflux disease)   . Hepatitis A as child  . HLD (hyperlipidemia)   . Persistent atrial fibrillation ([place]) 12/18/2016  . Pneumonia 1999; 2018  . 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 Prostate cancer [facility])     Patient Active Problem List   Diagnosis Date Noted  . Pacemaker 05/13/2020  . Malignant neoplasm of prostate ([place]) 01/09/2020  . Annual physical exam 01/08/2020  . Acute upper respiratory infection 01/08/2020  . Asymptomatic varicose veins of lower extremity 01/08/2020  . Bronchitis 01/08/2020  . Cataract 01/08/2020  . Constipation 01/08/2020   . Diverticulosis of large intestine without perforation or abscess without bleeding 01/08/2020  . Screening for gout 01/08/2020  . False positive serological test for syphilis 01/08/2020  . Gastro-esophageal reflux disease without esophagitis 01/08/2020  . 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 History of colonic polyps 01/08/2020  . Impacted cerumen 01/08/2020  . Insomnia 01/08/2020  . Osteoarthritis 01/08/2020  . Polypharmacy 01/08/2020  . Pure hypercholesterolemia 01/08/2020  . Rosacea 01/08/2020  . Combined forms of age-related cataract of both eyes 07/13/2019  . Elevated blood pressure reading 11/08/2018  . Sick sinus syndrome ([place]) 02/16/2017  . Persistent atrial fibrillation ([place]) 12/18/2016  . 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 Nonexudative age-related macular degeneration, bilateral, 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  . Dermatochalasis of both upper eyelids 07/25/2014  . Nuclear sclerosis of both eyes 07/25/2014  . Bradycardia 05/29/2014  . Premature atrial contractions 05/29/2014  . Premature junctional contractions ([place]) 05/29/2014  . Hyperlipidemia 05/29/2014  . Encounter for general adult medical examination with abnormal findings 04/24/2014  . Vitamin D deficiency 04/24/2014    Past Surgical History:  Procedure Laterality Date  . APPENDECTOMY     age 71  . CARDIOVERSION  02/16/2017  . CARDIOVERSION N/A 02/16/2017   Procedure: CARDIOVERSION;  Surgeon: Thompson Grayer, MD;  Location: [facility];  Service: Cardiovascular;  Laterality: N/A;  . COLONOSCOPY WITH PROPOFOL N/A 12/13/2012   Procedure: COLONOSCOPY WITH PROPOFOL;  Surgeon:  Garlan Fair, MD;  Location: [facility];  Service: Endoscopy;  Laterality: N/A;  . FRACTURE SURGERY    . HYDROCELE EXCISION Left 05/16/2018   Procedure: HYDROCELECTOMY ADULT;  Surgeon: [name], MD;  Location: [facility];  Service: Urology;  Laterality: Left;  . INSERT / REPLACE / REMOVE PACEMAKER   02/16/2017  . 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 PROSTATE BIOPSY  11/23/2019  . WRIST FRACTURE SURGERY Left    with pins       Family History  Problem Relation Age of Onset  . CVA Mother   . Coronary artery disease Mother   . Stroke Mother   . Coronary artery disease Father   . Heart attack Father   . Stomach cancer Maternal Grandmother   . Breast cancer Neg Hx   . Prostate cancer Neg Hx   . Colon cancer Neg Hx   . Pancreatic cancer Neg Hx     Social History   Tobacco Use  . Smoking status: Former Smoker    Packs/day: 0.12    Years: 8.00    Pack years: 0.96    Types: Cigarettes    Quit date: 11/24/1965    Years since quitting: 54.5  . Smokeless tobacco: Never Used  Vaping Use  . Vaping Use: Never used  Substance Use Topics  . Alcohol use: Yes    Comment: 02/16/2017 "might have 1 drink/month"  . Drug use: No    Home Medications Prior to Admission medications   Medication Sig Start Date End Date Taking? Authorizing Provider  acetaminophen (TYLENOL) 500 MG tablet Take 500 mg by mouth daily as needed for mild pain.    [provider]  ELIQUIS 5 MG TABS tablet TAKE 1 TABLET(5 MG) BY MOUTH TWICE DAILY 03/04/20   Allred, Jeneen Rinks, MD  loratadine (CLARITIN) 10 MG tablet Take 10 mg by mouth daily as needed for allergies.    [provider]  Multiple Vitamin (MULTIVITAMIN WITH MINERALS) TABS tablet Take 1 tablet by mouth daily.    [provider]  Multiple Vitamins-Minerals (EYE VITAMINS) CAPS Take 1 capsule by mouth daily.     [provider]  mupirocin ointment (BACTROBAN) 2 % Place 1 application into the nose daily as needed (dry nose).    [provider]  ondansetron (ZOFRAN ODT) 4 MG disintegrating tablet Take 1 tablet (4 mg total) by mouth every 4 (four) hours as needed for nausea or vomiting. 04/09/20   Charlesetta Shanks, MD  oxyCODONE-acetaminophen (PERCOCET) 5-325 MG tablet Take 1-2 tablets by mouth every 4 (four) hours as needed. 04/09/20   Charlesetta Shanks, MD  pantoprazole (PROTONIX) 40 MG tablet Take 40 mg daily by mouth. 10/16/16   [provider]  rosuvastatin (CRESTOR) 10 MG tablet Take 10 mg by mouth every morning.    [provider]  tamsulosin (FLOMAX) 0.4 MG CAPS capsule Take 1 capsule (0.4 mg total) by mouth daily after supper. 04/18/20   Tyler Pita, MD    Allergies    Atorvastatin and Ezetimibe  Review of Systems   Review of Systems  Constitutional: Positive for fatigue. Negative for chills, diaphoresis and fever.  HENT: Negative for congestion, rhinorrhea and sneezing.   Eyes: Negative.   Respiratory: Negative for cough, chest tightness and shortness of breath.   Cardiovascular: Negative for chest pain and leg swelling.  Gastrointestinal: Positive for abdominal pain, constipation,  diarrhea, nausea and vomiting. Negative for blood in stool.  Genitourinary: Negative for difficulty urinating, flank pain, frequency and hematuria.  Musculoskeletal: Negative for arthralgias and back pain.  Skin: Negative for rash.  Neurological: Negative for dizziness, speech difficulty, weakness, numbness and headaches.    Physical Exam Updated Vital Signs BP 103/68   Pulse 84   Temp 98.4 F (36.9 C)   Resp 19   Ht 6\' 2"  (1.88 m)   Wt 93 kg   SpO2 95%   BMI 26.32 kg/m   Physical Exam Constitutional:      Appearance: He is well-developed.  HENT:     Head: Normocephalic and atraumatic.  Eyes:     Pupils: Pupils are equal, round, and reactive to light.  Cardiovascular:     Rate and Rhythm: Normal rate and regular rhythm.     Heart sounds: Normal heart sounds.  Pulmonary:     Effort: Pulmonary effort is normal. No respiratory distress.     Breath sounds: Normal breath sounds. No wheezing or rales.  Chest:     Chest wall: No tenderness.  Abdominal:     General: Bowel  sounds are normal.     Palpations: Abdomen is soft.     Tenderness: There is abdominal tenderness. There is no guarding or rebound.     Comments: Moderate diffuse tenderness  Genitourinary:    Comments: No significant stool in the rectum Musculoskeletal:        General: Normal range of motion.     Cervical back: Normal range of motion and neck supple.  Lymphadenopathy:     Cervical: No cervical adenopathy.  Skin:    General: Skin is warm and dry.     Findings: No rash.  Neurological:     Mental Status: He is alert and oriented to person, place, and time.     ED Results / Procedures / Treatments   Labs (all labs ordered are listed, but only abnormal results are displayed) Labs Reviewed  COMPREHENSIVE METABOLIC PANEL - Abnormal; Notable for the following components:      Result Value   Chloride 91 (*)    Glucose, Bld 131 (*)    BUN 24 (*)    Creatinine, Ser 1.43 (*)    Calcium 11.1 (*)    Total Bilirubin 2.0 (*)    GFR, Estimated 50 (*)    All other components within normal limits  CBC WITH DIFFERENTIAL/PLATELET - Abnormal; Notable for the following components:   WBC 3.5 (*)    All other components within normal limits  RESP PANEL BY RT-PCR (FLU A&B, COVID) ARPGX2  LIPASE, BLOOD  URINALYSIS, ROUTINE W REFLEX MICROSCOPIC    EKG None  Radiology CT Abdomen Pelvis W Contrast  Result Date: 06/01/2020 CLINICAL DATA:  Abdominal pain EXAM: CT ABDOMEN AND PELVIS WITH CONTRAST TECHNIQUE: Multidetector CT imaging of the abdomen and pelvis was performed using the standard protocol following bolus administration of intravenous contrast. CONTRAST:  156mL OMNIPAQUE IOHEXOL 300 MG/ML  SOLN COMPARISON:  04/09/2020 FINDINGS: Lower chest: Extensive, predominantly ground-glass opacity throughout the visualized right lower lobe. Left lung base is clear. Heart size within normal limits. Fluid distension of the distal esophagus. Hepatobiliary: No focal liver abnormality is seen. No gallstones,  gallbladder wall thickening, or biliary dilatation. Pancreas: Unremarkable. No pancreatic ductal dilatation or surrounding inflammatory changes. Spleen: Unchanged probable flash filling hemangioma within the upper pole of the spleen. Spleen otherwise unremarkable. Adrenals/Urinary Tract: Unremarkable adrenal glands. Kidneys enhance symmetrically without focal lesion,  stone, or hydronephrosis. Ureters are nondilated. Urinary bladder appears unremarkable. Stomach/Bowel: High-grade small bowel obstruction with abrupt transition point within the right lower quadrant (series 2, image 59). Small bowel dilated up to 4.1 cm in diameter with air-fluid levels. Stomach and distal esophagus are dilated and fluid-filled. Distal small bowel and colon are decompressed. Extensive colonic diverticulosis. Vascular/Lymphatic: Scattered aortoiliac atherosclerotic calcifications without aneurysm. No abdominopelvic lymphadenopathy. Reproductive: Brachytherapy seeds within the prostate gland. Other: Small volume ascites.  No pneumoperitoneum. Musculoskeletal: No acute or significant osseous findings. Moderate degenerative changes of the bilateral hips. Scattered lumbar spondylosis. IMPRESSION: 1. High-grade small bowel obstruction with abrupt transition point within the right lower quadrant. Surgical evaluation recommended. 2. Small volume ascites, likely reactive. 3. Extensive, predominantly ground-glass opacity throughout the visualized right lower lobe, suspicious for pneumonia. Radiographic follow-up to resolution recommended. 4. Extensive colonic diverticulosis without evidence of acute diverticulitis. 5. Aortic atherosclerosis (ICD10-I70.0). These results were called by telephone at the time of interpretation on 06/01/2020 at 2:29 pm to provider Kristoffer Bala , who verbally acknowledged these results. Electronically Signed   By: Davina Poke D.O.   On: 06/01/2020 14:30    Procedures Procedures   Medications Ordered in  ED Medications  lidocaine (XYLOCAINE) 2 % jelly 1 application (has no administration in time range)  sodium chloride 0.9 % bolus 500 mL (500 mLs Intravenous New Bag/Given 06/01/20 1335)  ondansetron (ZOFRAN) injection 4 mg (4 mg Intravenous Given 06/01/20 1330)  morphine 4 MG/ML injection 4 mg (4 mg Intravenous Given 06/01/20 1330)  iohexol (OMNIPAQUE) 300 MG/ML solution 100 mL (100 mLs Intravenous Contrast Given 06/01/20 1400)  morphine 4 MG/ML injection 4 mg (4 mg Intravenous Given 06/01/20 1420)    ED Course  I have reviewed the triage vital signs and the nursing notes.  Pertinent labs & imaging results that were available during my care of the patient were reviewed by me and considered in my medical decision making (see chart for details).    MDM Rules/Calculators/A&P                          Patient is a 75 year old male who presents with abdominal pain and vomiting.  CT scan shows evidence of a high-grade small bowel obstruction with transition point in the right lower quadrant.  NG tube was placed.  His labs were reviewed.  His creatinine is mildly elevated.  He was given IV fluids.  I spoke with Dr. Ninfa Linden with general surgery who says that there is surgery practice will follow the patient.  He request a hospitalist admission.  I spoke with Dr. Maryln Gottron with [facility] hospitalist service who will admit the patient.  Patient had requested [place] [facility] for admission versus [place]. Final Clinical Impression(s) / ED Diagnoses Final diagnoses:  SBO (small bowel obstruction) ([place])    Rx / DC Orders ED Discharge Orders    None       Malvin Johns, MD 06/01/20 1502

## 2020-06-01 NOTE — ED Notes (Signed)
I advance the N.G. tube 5 cm prior to transfer. He continues to receive IV bolus. He remains awake and in no distress.

## 2020-06-01 NOTE — ED Triage Notes (Signed)
Pt finished radiation treatment 1 week ago. Pt c/o constipation for several weeks. States he feels impacted. Also c/o vomiting since last night. Pt reports that he did a fleet enema, and took dulcolax without relief.

## 2020-06-01 NOTE — ED Notes (Signed)
[facility] leaves with him at this time. He remains awake, alert and in no distress.

## 2020-06-01 NOTE — ED Notes (Signed)
Per Dr. Tamera Punt, 2nd Troponin drawn at this time.

## 2020-06-01 NOTE — Progress Notes (Signed)
Pharmacy Antibiotic Note  Todd Chapman is a 75 y.o. male admitted on 06/01/2020.  Pharmacy has been consulted for Unasyn dosing for aspiration pneumonia.   Plan: Unasyn 3g IV q6h Follow up renal function, culture results, and clinical course.   Height: 6\' 2"  (188 cm) Weight: 93 kg (205 lb) IBW/kg (Calculated) : 82.2  Temp (24hrs), Avg:98.4 F (36.9 C), Min:98.3 F (36.8 C), Max:98.4 F (36.9 C)  Recent Labs  Lab 06/01/20 1307  WBC 3.5*  CREATININE 1.43*    Estimated Creatinine Clearance: 50.3 mL/min (A) (by C-G formula based on SCr of 1.43 mg/dL (H)).    Allergies  Allergen Reactions  . Atorvastatin Palpitations  . Ezetimibe Palpitations    Antimicrobials this admission: 3/26 Unasyn >>   Dose adjustments this admission:   Microbiology results: 3/26 COVID neg, Influenza A/B neg 3/26 BCx:    Thank you for allowing pharmacy to be a part of this patient's care.  Gretta Arab PharmD, BCPS Clinical Pharmacist [facility] main pharmacy [phone] 06/01/2020 6:39 PM

## 2020-06-01 NOTE — Consult Note (Signed)
CC: Small bowel obstruction  Requesting provider: Dr. Claybon Jabs  HPI: This is a 75 year old gentleman with a history of prostate cancer who recently finished external beam radiation to the prostate who presented to [facility] with worsening cramping abdominal pain.  He also been having intermittent diarrhea.  He then developed some nausea and vomiting.  Back in February of this year he had similar abdominal pain and was found on CT scan to have infectious or inflammatory terminal ileitis which improved with conservative management.  He underwent another CT scan today showing a high-grade small bowel obstruction with an abrupt transition at the right lower quadrant.  He is on Eliquis.  His last dose was yesterday.  Currently reports he still has some crampy abdominal pain.  He currently has a nasogastric tube in place.  Past Medical History:  Diagnosis Date  . Arthritis    "thumbs, back" (02/16/2017)  . Bradycardia 02/2017  . Dyspnea    with exertion  . Flu 2 weeks ago  . GERD (gastroesophageal reflux disease)   . Hepatitis A as child  . HLD (hyperlipidemia)   . Persistent atrial fibrillation ([place]) 12/18/2016  . Pneumonia 1999; 2018  . 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 Prostate cancer [facility])     Past Surgical History:  Procedure Laterality Date  . APPENDECTOMY     age 63  . CARDIOVERSION  02/16/2017  . CARDIOVERSION N/A 02/16/2017   Procedure: CARDIOVERSION;  Surgeon: Thompson Grayer, MD;  Location: [place] [facility];  Service: Cardiovascular;  Laterality: N/A;  . COLONOSCOPY WITH PROPOFOL N/A 12/13/2012   Procedure: COLONOSCOPY WITH PROPOFOL;  Surgeon: Garlan Fair, MD;  Location: [facility];  Service: Endoscopy;  Laterality: N/A;  . FRACTURE SURGERY    . HYDROCELE EXCISION Left 05/16/2018   Procedure: HYDROCELECTOMY ADULT;  Surgeon: [name], MD;  Location: [facility];  Service: Urology;  Laterality: Left;  . INSERT /  REPLACE / REMOVE PACEMAKER  02/16/2017  . 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 PROSTATE BIOPSY  11/23/2019  . WRIST FRACTURE SURGERY Left    with pins    Family History  Problem Relation Age of Onset  . CVA Mother   . Coronary artery disease Mother   . Stroke Mother   . Coronary artery disease Father   . Heart attack Father   . Stomach cancer Maternal Grandmother   . Breast cancer Neg Hx   . Prostate cancer Neg Hx   . Colon cancer Neg Hx   . Pancreatic cancer Neg Hx     Social:  reports that he quit smoking about 54 years ago. His smoking use included cigarettes. He has a 0.96 pack-year smoking history. He has never used smokeless tobacco. He reports current alcohol use. He reports that he does not use drugs.  Allergies:  Allergies  Allergen Reactions  . Atorvastatin Palpitations  . Ezetimibe Palpitations    Medications: I have reviewed the patient's current medications.  Results for orders placed or performed during the hospital encounter of 06/01/20 (from the past 48 hour(s))  Comprehensive metabolic panel     Status: Abnormal   Collection Time: 06/01/20  1:07 PM  Result Value Ref Range   Sodium 135 135 - 145 mmol/L   Potassium 3.5 3.5 - 5.1 mmol/L   Chloride 91 (L) 98 - 111 mmol/L  CO2 30 22 - 32 mmol/L   Glucose, Bld 131 (H) 70 - 99 mg/dL    Comment: Glucose reference range applies only to samples taken after fasting for at least 8 hours.   BUN 24 (H) 8 - 23 mg/dL   Creatinine, Ser 1.43 (H) 0.61 - 1.24 mg/dL   Calcium 11.1 (H) 8.9 - 10.3 mg/dL   Total Protein 6.7 6.5 - 8.1 g/dL   Albumin 4.4 3.5 - 5.0 g/dL   AST 29 15 - 41 U/L   ALT 21 0 - 44 U/L   Alkaline Phosphatase 70 38 - 126 U/L   Total Bilirubin 2.0 (H) 0.3 - 1.2 mg/dL   GFR, Estimated 50 (L) >60 mL/min    Comment: (NOTE) Calculated using the CKD-EPI Creatinine Equation (2021)    Anion gap  14 5 - 15    Comment: Performed at [facility]  Lipase, blood     Status: None   Collection Time: 06/01/20  1:07 PM  Result Value Ref Range   Lipase 21 11 - 51 U/L    Comment: Performed at [facility]  CBC with Differential     Status: Abnormal   Collection Time: 06/01/20  1:07 PM  Result Value Ref Range   WBC 3.5 (L) 4.0 - 10.5 K/uL   RBC 4.68 4.22 - 5.81 MIL/uL   Hemoglobin 14.5 13.0 - 17.0 g/dL   HCT 43.5 39.0 - 52.0 %   MCV 92.9 80.0 - 100.0 fL   MCH 31.0 26.0 - 34.0 pg   MCHC 33.3 30.0 - 36.0 g/dL   RDW 13.2 11.5 - 15.5 %   Platelets 191 150 - 400 K/uL   nRBC 0.0 0.0 - 0.2 %   Neutrophils Relative % 82 %   Neutro Abs 2.8 1.7 - 7.7 K/uL   Lymphocytes Relative 4 %   Lymphs Abs 0.2 (L) 0.7 - 4.0 K/uL   Monocytes Relative 13 %   Monocytes Absolute 0.5 0.1 - 1.0 K/uL   Eosinophils Relative 0 %   Eosinophils Absolute 0.0 0.0 - 0.5 K/uL   Basophils Relative 1 %   Basophils Absolute 0.0 0.0 - 0.1 K/uL   Immature Granulocytes 0 %   Abs Immature Granulocytes 0.01 0.00 - 0.07 K/uL    Comment: Performed at [facility]  Resp Panel by RT-PCR (Flu A&B, Covid) Nasopharyngeal Swab     Status: None   Collection Time: 06/01/20  3:38 PM   Specimen: Nasopharyngeal Swab; Nasopharyngeal(NP) swabs in vial transport medium  Result Value Ref Range   SARS Coronavirus 2 by RT PCR NEGATIVE NEGATIVE    Comment: (NOTE) SARS-CoV-2 target nucleic acids are NOT DETECTED.  The SARS-CoV-2 RNA is generally detectable in upper respiratory specimens during the acute phase of infection. The lowest concentration of SARS-CoV-2 viral copies this assay can detect is 138 copies/mL. A negative result does not preclude SARS-Cov-2 infection and should not be used as the sole basis for treatment or other patient management decisions. A negative result may occur with  improper specimen collection/handling, submission of specimen other than nasopharyngeal swab,  presence of viral mutation(s) within the areas targeted by this assay, and inadequate number of viral copies(<138 copies/mL). A negative result must be combined with clinical observations, patient history, and epidemiological information. The expected result is Negative.  Fact Sheet for Patients:  EntrepreneurPulse.com.au  Fact Sheet for Healthcare Providers:  IncredibleEmployment.be  This test is no t yet approved or cleared by  the [employer] and  has been authorized for detection and/or diagnosis of SARS-CoV-2 by FDA under an Emergency Use Authorization (EUA). This EUA will remain  in effect (meaning this test can be used) for the duration of the COVID-19 declaration under Section 564(b)(1) of the Act, 21 U.S.C.section 360bbb-3(b)(1), unless the authorization is terminated  or revoked sooner.       Influenza A by PCR NEGATIVE NEGATIVE   Influenza B by PCR NEGATIVE NEGATIVE    Comment: (NOTE) The Xpert Xpress SARS-CoV-2/FLU/RSV plus assay is intended as an aid in the diagnosis of influenza from Nasopharyngeal swab specimens and should not be used as a sole basis for treatment. Nasal washings and aspirates are unacceptable for Xpert Xpress SARS-CoV-2/FLU/RSV testing.  Fact Sheet for Patients: EntrepreneurPulse.com.au  Fact Sheet for Healthcare Providers: IncredibleEmployment.be  This test is not yet approved or cleared by the [place] FDA and has been authorized for detection and/or diagnosis of SARS-CoV-2 by FDA under an Emergency Use Authorization (EUA). This EUA will remain in effect (meaning this test can be used) for the duration of the COVID-19 declaration under Section 564(b)(1) of the Act, 21 U.S.C. section 360bbb-3(b)(1), unless the authorization is terminated or revoked.  Performed at [facility]     DG Abd 1 View  Result Date: 06/01/2020 CLINICAL DATA:  NG  tube placement. EXAM: ABDOMEN - 1 VIEW COMPARISON:  06/01/2020 CT and prior studies FINDINGS: An NG tube is noted with tip overlying the proximal stomach and side hole at the esophagogastric junction-recommend advancement. No other changes noted. IMPRESSION: NG tube with tip overlying the proximal stomach and side hole at the esophagogastric junction-recommend advancement. Electronically Signed   By: Margarette Canada M.D.   On: 06/01/2020 16:14   CT Abdomen Pelvis W Contrast  Result Date: 06/01/2020 CLINICAL DATA:  Abdominal pain EXAM: CT ABDOMEN AND PELVIS WITH CONTRAST TECHNIQUE: Multidetector CT imaging of the abdomen and pelvis was performed using the standard protocol following bolus administration of intravenous contrast. CONTRAST:  173mL OMNIPAQUE IOHEXOL 300 MG/ML  SOLN COMPARISON:  04/09/2020 FINDINGS: Lower chest: Extensive, predominantly ground-glass opacity throughout the visualized right lower lobe. Left lung base is clear. Heart size within normal limits. Fluid distension of the distal esophagus. Hepatobiliary: No focal liver abnormality is seen. No gallstones, gallbladder wall thickening, or biliary dilatation. Pancreas: Unremarkable. No pancreatic ductal dilatation or surrounding inflammatory changes. Spleen: Unchanged probable flash filling hemangioma within the upper pole of the spleen. Spleen otherwise unremarkable. Adrenals/Urinary Tract: Unremarkable adrenal glands. Kidneys enhance symmetrically without focal lesion, stone, or hydronephrosis. Ureters are nondilated. Urinary bladder appears unremarkable. Stomach/Bowel: High-grade small bowel obstruction with abrupt transition point within the right lower quadrant (series 2, image 59). Small bowel dilated up to 4.1 cm in diameter with air-fluid levels. Stomach and distal esophagus are dilated and fluid-filled. Distal small bowel and colon are decompressed. Extensive colonic diverticulosis. Vascular/Lymphatic: Scattered aortoiliac atherosclerotic  calcifications without aneurysm. No abdominopelvic lymphadenopathy. Reproductive: Brachytherapy seeds within the prostate gland. Other: Small volume ascites.  No pneumoperitoneum. Musculoskeletal: No acute or significant osseous findings. Moderate degenerative changes of the bilateral hips. Scattered lumbar spondylosis. IMPRESSION: 1. High-grade small bowel obstruction with abrupt transition point within the right lower quadrant. Surgical evaluation recommended. 2. Small volume ascites, likely reactive. 3. Extensive, predominantly ground-glass opacity throughout the visualized right lower lobe, suspicious for pneumonia. Radiographic follow-up to resolution recommended. 4. Extensive colonic diverticulosis without evidence of acute diverticulitis. 5. Aortic atherosclerosis (ICD10-I70.0). These results were called by  telephone at the time of interpretation on 06/01/2020 at 2:29 pm to provider MELANIE BELFI , who verbally acknowledged these results. Electronically Signed   By: Davina Poke D.O.   On: 06/01/2020 14:30    ROS - all of the below systems have been reviewed with the patient and positives are indicated with bold text General: chills, fever or night sweats Eyes: blurry vision or double vision ENT: epistaxis or sore throat Allergy/Immunology: itchy/watery eyes or nasal congestion Hematologic/Lymphatic: bleeding problems, blood clots or swollen lymph nodes Endocrine: temperature intolerance or unexpected weight changes Breast: new or changing breast lumps or nipple discharge Resp: cough, shortness of breath, or wheezing CV: chest pain or dyspnea on exertion GI: as per HPI GU: dysuria, trouble voiding, or hematuria MSK: joint pain or joint stiffness Neuro: TIA or stroke symptoms Derm: pruritus and skin lesion changes Psych: anxiety and depression  PE Blood pressure 97/65, pulse 85, temperature 98.3 F (36.8 C), temperature source Oral, resp. rate 18, height 6\' 2"  (1.88 m), weight 93 kg,  SpO2 98 %. Constitutional: NAD; conversant; no deformities Lungs: Normal respiratory effort; no tactile fremitus CV: RRR;  GI: Abd mildly distended with minimal tenderness and no frank peritonitis.  There are no hernias; no palpable hepatosplenomegaly MSK: Normal range of motion of extremities; no clubbing/cyanosis Psychiatric: Appropriate affect; alert and oriented x3   Results for orders placed or performed during the hospital encounter of 06/01/20 (from the past 48 hour(s))  Comprehensive metabolic panel     Status: Abnormal   Collection Time: 06/01/20  1:07 PM  Result Value Ref Range   Sodium 135 135 - 145 mmol/L   Potassium 3.5 3.5 - 5.1 mmol/L   Chloride 91 (L) 98 - 111 mmol/L   CO2 30 22 - 32 mmol/L   Glucose, Bld 131 (H) 70 - 99 mg/dL    Comment: Glucose reference range applies only to samples taken after fasting for at least 8 hours.   BUN 24 (H) 8 - 23 mg/dL   Creatinine, Ser 1.43 (H) 0.61 - 1.24 mg/dL   Calcium 11.1 (H) 8.9 - 10.3 mg/dL   Total Protein 6.7 6.5 - 8.1 g/dL   Albumin 4.4 3.5 - 5.0 g/dL   AST 29 15 - 41 U/L   ALT 21 0 - 44 U/L   Alkaline Phosphatase 70 38 - 126 U/L   Total Bilirubin 2.0 (H) 0.3 - 1.2 mg/dL   GFR, Estimated 50 (L) >60 mL/min    Comment: (NOTE) Calculated using the CKD-EPI Creatinine Equation (2021)    Anion gap 14 5 - 15    Comment: Performed at [facility]  Lipase, blood     Status: None   Collection Time: 06/01/20  1:07 PM  Result Value Ref Range   Lipase 21 11 - 51 U/L    Comment: Performed at [facility]  CBC with Differential     Status: Abnormal   Collection Time: 06/01/20  1:07 PM  Result Value Ref Range   WBC 3.5 (L) 4.0 - 10.5 K/uL   RBC 4.68 4.22 - 5.81 MIL/uL   Hemoglobin 14.5 13.0 - 17.0 g/dL   HCT 43.5 39.0 - 52.0 %   MCV 92.9 80.0 - 100.0 fL   MCH 31.0 26.0 - 34.0 pg   MCHC 33.3 30.0 - 36.0 g/dL   RDW 13.2 11.5 - 15.5 %   Platelets 191 150 - 400 K/uL   nRBC 0.0 0.0 - 0.2 %  Neutrophils Relative % 82 %   Neutro Abs 2.8 1.7 - 7.7 K/uL   Lymphocytes Relative 4 %   Lymphs Abs 0.2 (L) 0.7 - 4.0 K/uL   Monocytes Relative 13 %   Monocytes Absolute 0.5 0.1 - 1.0 K/uL   Eosinophils Relative 0 %   Eosinophils Absolute 0.0 0.0 - 0.5 K/uL   Basophils Relative 1 %   Basophils Absolute 0.0 0.0 - 0.1 K/uL   Immature Granulocytes 0 %   Abs Immature Granulocytes 0.01 0.00 - 0.07 K/uL    Comment: Performed at [facility]  Resp Panel by RT-PCR (Flu A&B, Covid) Nasopharyngeal Swab     Status: None   Collection Time: 06/01/20  3:38 PM   Specimen: Nasopharyngeal Swab; Nasopharyngeal(NP) swabs in vial transport medium  Result Value Ref Range   SARS Coronavirus 2 by RT PCR NEGATIVE NEGATIVE    Comment: (NOTE) SARS-CoV-2 target nucleic acids are NOT DETECTED.  The SARS-CoV-2 RNA is generally detectable in upper respiratory specimens during the acute phase of infection. The lowest concentration of SARS-CoV-2 viral copies this assay can detect is 138 copies/mL. A negative result does not preclude SARS-Cov-2 infection and should not be used as the sole basis for treatment or other patient management decisions. A negative result may occur with  improper specimen collection/handling, submission of specimen other than nasopharyngeal swab, presence of viral mutation(s) within the areas targeted by this assay, and inadequate number of viral copies(<138 copies/mL). A negative result must be combined with clinical observations, patient history, and epidemiological information. The expected result is Negative.  Fact Sheet for Patients:  EntrepreneurPulse.com.au  Fact Sheet for Healthcare Providers:  IncredibleEmployment.be  This test is no t yet approved or cleared by the [place] FDA and  has been authorized for detection and/or diagnosis of SARS-CoV-2 by FDA under an Emergency Use Authorization (EUA). This EUA will  remain  in effect (meaning this test can be used) for the duration of the COVID-19 declaration under Section 564(b)(1) of the Act, 21 U.S.C.section 360bbb-3(b)(1), unless the authorization is terminated  or revoked sooner.       Influenza A by PCR NEGATIVE NEGATIVE   Influenza B by PCR NEGATIVE NEGATIVE    Comment: (NOTE) The Xpert Xpress SARS-CoV-2/FLU/RSV plus assay is intended as an aid in the diagnosis of influenza from Nasopharyngeal swab specimens and should not be used as a sole basis for treatment. Nasal washings and aspirates are unacceptable for Xpert Xpress SARS-CoV-2/FLU/RSV testing.  Fact Sheet for Patients: EntrepreneurPulse.com.au  Fact Sheet for Healthcare Providers: IncredibleEmployment.be  This test is not yet approved or cleared by the [place] FDA and has been authorized for detection and/or diagnosis of SARS-CoV-2 by FDA under an Emergency Use Authorization (EUA). This EUA will remain in effect (meaning this test can be used) for the duration of the COVID-19 declaration under Section 564(b)(1) of the Act, 21 U.S.C. section 360bbb-3(b)(1), unless the authorization is terminated or revoked.  Performed at [facility]     DG Abd 1 View  Result Date: 06/01/2020 CLINICAL DATA:  NG tube placement. EXAM: ABDOMEN - 1 VIEW COMPARISON:  06/01/2020 CT and prior studies FINDINGS: An NG tube is noted with tip overlying the proximal stomach and side hole at the esophagogastric junction-recommend advancement. No other changes noted. IMPRESSION: NG tube with tip overlying the proximal stomach and side hole at the esophagogastric junction-recommend advancement. Electronically Signed   By: Margarette Canada M.D.   On: 06/01/2020 16:14  CT Abdomen Pelvis W Contrast  Result Date: 06/01/2020 CLINICAL DATA:  Abdominal pain EXAM: CT ABDOMEN AND PELVIS WITH CONTRAST TECHNIQUE: Multidetector CT imaging of the abdomen and pelvis  was performed using the standard protocol following bolus administration of intravenous contrast. CONTRAST:  133mL OMNIPAQUE IOHEXOL 300 MG/ML  SOLN COMPARISON:  04/09/2020 FINDINGS: Lower chest: Extensive, predominantly ground-glass opacity throughout the visualized right lower lobe. Left lung base is clear. Heart size within normal limits. Fluid distension of the distal esophagus. Hepatobiliary: No focal liver abnormality is seen. No gallstones, gallbladder wall thickening, or biliary dilatation. Pancreas: Unremarkable. No pancreatic ductal dilatation or surrounding inflammatory changes. Spleen: Unchanged probable flash filling hemangioma within the upper pole of the spleen. Spleen otherwise unremarkable. Adrenals/Urinary Tract: Unremarkable adrenal glands. Kidneys enhance symmetrically without focal lesion, stone, or hydronephrosis. Ureters are nondilated. Urinary bladder appears unremarkable. Stomach/Bowel: High-grade small bowel obstruction with abrupt transition point within the right lower quadrant (series 2, image 59). Small bowel dilated up to 4.1 cm in diameter with air-fluid levels. Stomach and distal esophagus are dilated and fluid-filled. Distal small bowel and colon are decompressed. Extensive colonic diverticulosis. Vascular/Lymphatic: Scattered aortoiliac atherosclerotic calcifications without aneurysm. No abdominopelvic lymphadenopathy. Reproductive: Brachytherapy seeds within the prostate gland. Other: Small volume ascites.  No pneumoperitoneum. Musculoskeletal: No acute or significant osseous findings. Moderate degenerative changes of the bilateral hips. Scattered lumbar spondylosis. IMPRESSION: 1. High-grade small bowel obstruction with abrupt transition point within the right lower quadrant. Surgical evaluation recommended. 2. Small volume ascites, likely reactive. 3. Extensive, predominantly ground-glass opacity throughout the visualized right lower lobe, suspicious for pneumonia. Radiographic  follow-up to resolution recommended. 4. Extensive colonic diverticulosis without evidence of acute diverticulitis. 5. Aortic atherosclerosis (ICD10-I70.0). These results were called by telephone at the time of interpretation on 06/01/2020 at 2:29 pm to provider MELANIE BELFI , who verbally acknowledged these results. Electronically Signed   By: Davina Poke D.O.   On: 06/01/2020 14:30     A/P: Small bowel obstruction  I suspect this may be secondary to his previous terminal ileitis which may be secondary to his radiation.  His only previous vertical history was an appendectomy as a child.  Currently he will continue bowel rest with a nasogastric tube in place and we will start the small bowel protocol.  Hopefully he will improve without the need for surgery which would potentially involve an ileocolectomy.  We will follow him with you.  Coralie Keens, M.D. [facility] Use [email or web address] to contact on call provider

## 2020-06-02 ENCOUNTER — Other Ambulatory Visit: Payer: Self-pay

## 2020-06-02 ENCOUNTER — Inpatient Hospital Stay (HOSPITAL_COMMUNITY): Payer: Medicare Other

## 2020-06-02 DIAGNOSIS — R7303 Prediabetes: Secondary | ICD-10-CM | POA: Diagnosis not present

## 2020-06-02 DIAGNOSIS — K56609 Unspecified intestinal obstruction, unspecified as to partial versus complete obstruction: Secondary | ICD-10-CM | POA: Diagnosis not present

## 2020-06-02 DIAGNOSIS — D649 Anemia, unspecified: Secondary | ICD-10-CM

## 2020-06-02 DIAGNOSIS — J690 Pneumonitis due to inhalation of food and vomit: Secondary | ICD-10-CM | POA: Diagnosis not present

## 2020-06-02 DIAGNOSIS — N179 Acute kidney failure, unspecified: Secondary | ICD-10-CM | POA: Diagnosis not present

## 2020-06-02 LAB — CBC
HCT: 34.9 % — ABNORMAL LOW (ref 39.0–52.0)
Hemoglobin: 11.8 g/dL — ABNORMAL LOW (ref 13.0–17.0)
MCH: 31.8 pg (ref 26.0–34.0)
MCHC: 33.8 g/dL (ref 30.0–36.0)
MCV: 94.1 fL (ref 80.0–100.0)
Platelets: 154 10*3/uL (ref 150–400)
RBC: 3.71 MIL/uL — ABNORMAL LOW (ref 4.22–5.81)
RDW: 13.5 % (ref 11.5–15.5)
WBC: 9.6 10*3/uL (ref 4.0–10.5)
nRBC: 0 % (ref 0.0–0.2)

## 2020-06-02 LAB — COMPREHENSIVE METABOLIC PANEL
ALT: 22 U/L (ref 0–44)
AST: 33 U/L (ref 15–41)
Albumin: 3.4 g/dL — ABNORMAL LOW (ref 3.5–5.0)
Alkaline Phosphatase: 58 U/L (ref 38–126)
Anion gap: 12 (ref 5–15)
BUN: 36 mg/dL — ABNORMAL HIGH (ref 8–23)
CO2: 29 mmol/L (ref 22–32)
Calcium: 9.8 mg/dL (ref 8.9–10.3)
Chloride: 96 mmol/L — ABNORMAL LOW (ref 98–111)
Creatinine, Ser: 1.57 mg/dL — ABNORMAL HIGH (ref 0.61–1.24)
GFR, Estimated: 45 mL/min — ABNORMAL LOW (ref >60)
Glucose, Bld: 121 mg/dL — ABNORMAL HIGH (ref 70–99)
Potassium: 3.9 mmol/L (ref 3.5–5.1)
Sodium: 137 mmol/L (ref 135–145)
Total Bilirubin: 2.3 mg/dL — ABNORMAL HIGH (ref 0.3–1.2)
Total Protein: 6.1 g/dL — ABNORMAL LOW (ref 6.5–8.1)

## 2020-06-02 LAB — HEMOGLOBIN A1C
Hgb A1c MFr Bld: 5.9 % — ABNORMAL HIGH (ref 4.8–5.6)
Mean Plasma Glucose: 122.63 mg/dL

## 2020-06-02 LAB — PHOSPHORUS: Phosphorus: 4.7 mg/dL — ABNORMAL HIGH (ref 2.5–4.6)

## 2020-06-02 LAB — GLUCOSE, CAPILLARY
Glucose-Capillary: 114 mg/dL — ABNORMAL HIGH (ref 70–99)
Glucose-Capillary: 120 mg/dL — ABNORMAL HIGH (ref 70–99)
Glucose-Capillary: 120 mg/dL — ABNORMAL HIGH (ref 70–99)
Glucose-Capillary: 132 mg/dL — ABNORMAL HIGH (ref 70–99)

## 2020-06-02 LAB — TSH: TSH: 2.044 u[IU]/mL (ref 0.350–4.500)

## 2020-06-02 LAB — MAGNESIUM: Magnesium: 2.3 mg/dL (ref 1.7–2.4)

## 2020-06-02 IMAGING — DX DG ABD PORTABLE 1V
2 series · 2 of 2 positions shown · non-contrast
Comparison: [DATE]

CLINICAL DATA: Small bowel obstruction

EXAM:
PORTABLE ABDOMEN - 1 VIEW

[abdomen kub (1 of 2)]
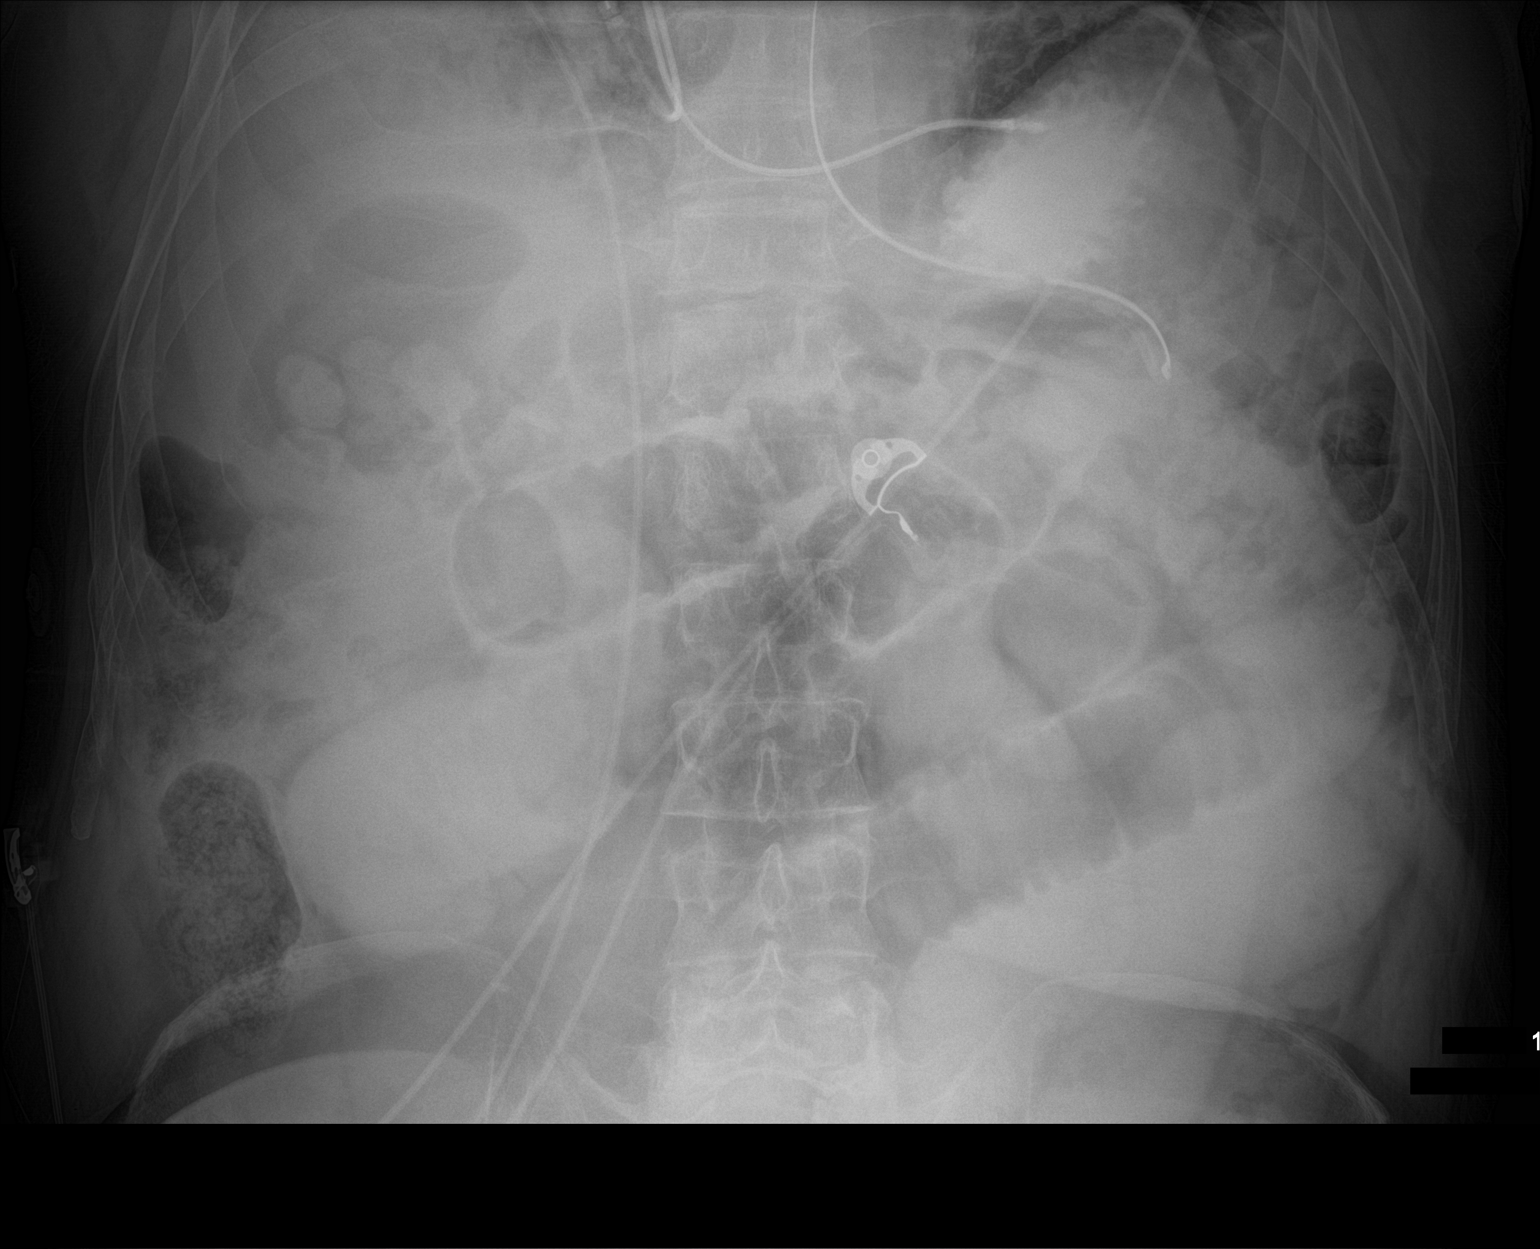

[abdomen kub (2 of 2)]
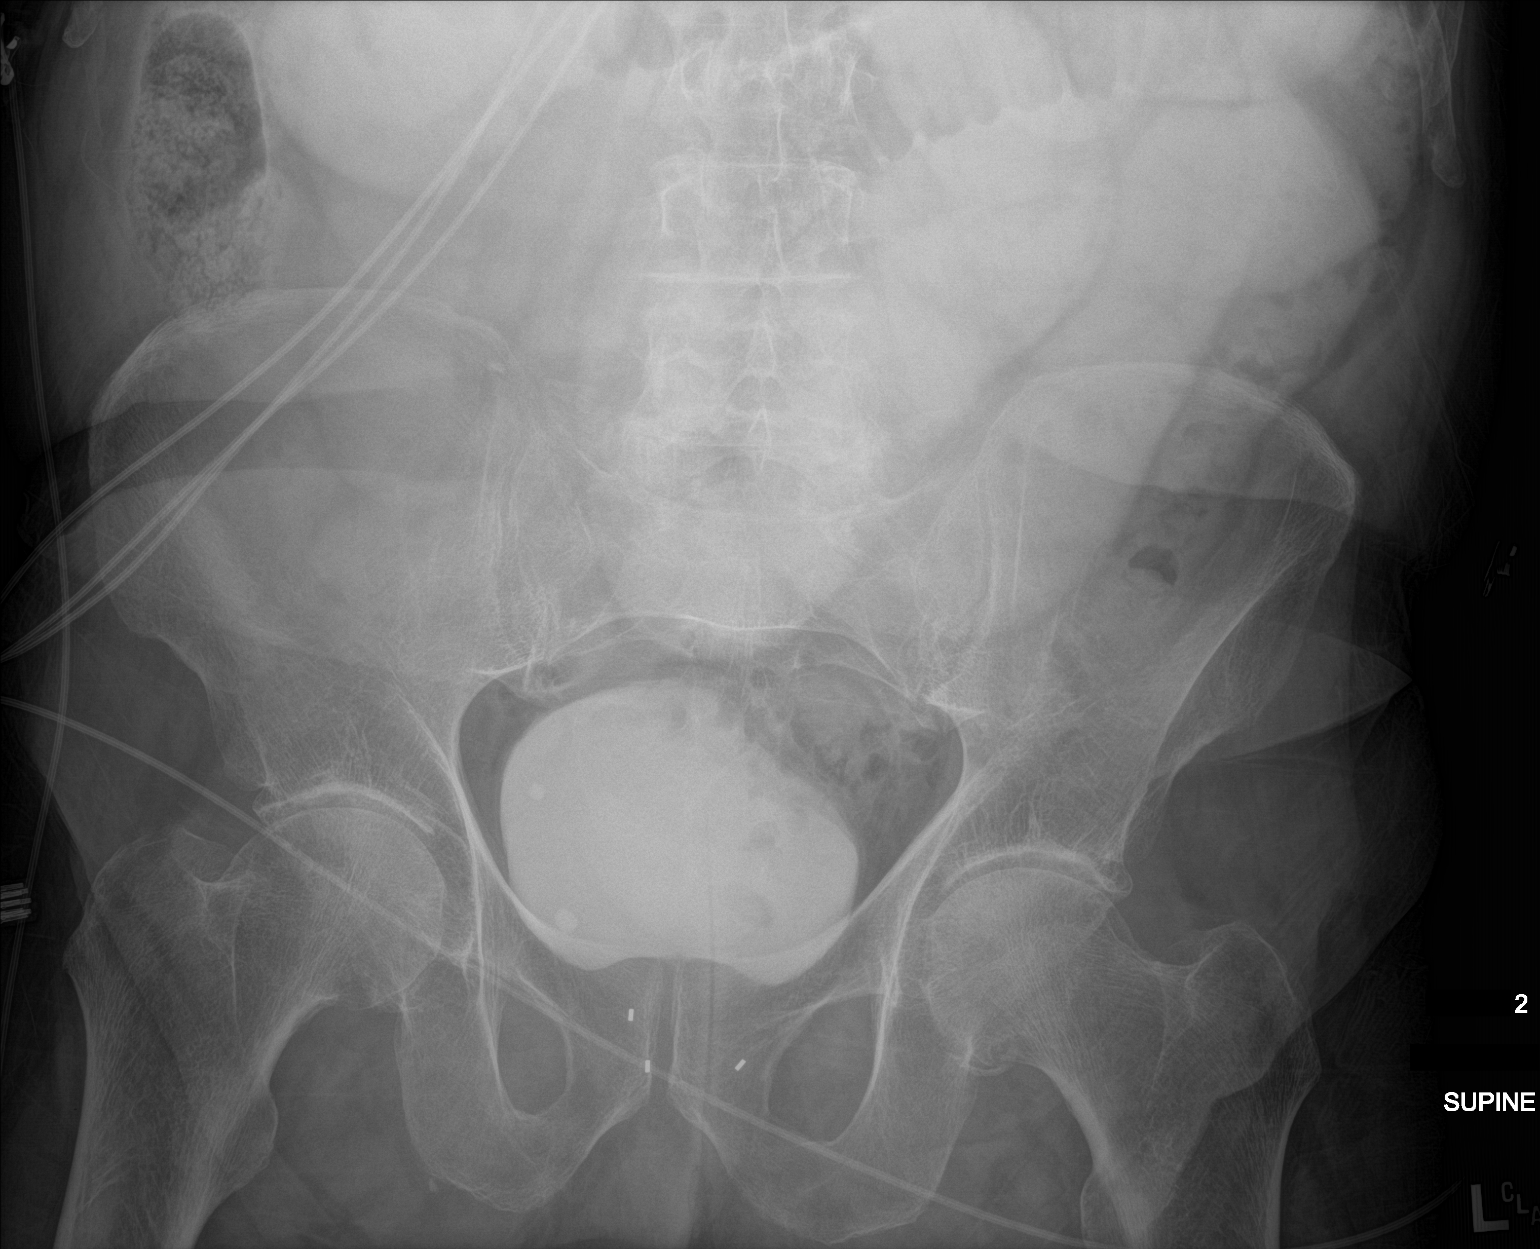

[2 of 2 positions shown; findings below may reference images not displayed]

FINDINGS: Enteric tube terminates in the proximal gastric body. Contrast in
the gastric cardia.

Multiple dilated loops of small bowel in the central abdomen,
compatible with persistent small bowel obstruction.

Excretory contrast in the bladder.
IMPRESSION: Multiple dilated loops of small bowel in the central abdomen,
compatible with persistent small bowel obstruction.

## 2020-06-02 MED ORDER — SODIUM CHLORIDE 0.9 % IV BOLUS
500.0000 mL | Freq: Once | INTRAVENOUS | Status: AC
  Administered 2020-06-02: 500 mL via INTRAVENOUS

## 2020-06-02 MED ORDER — ORAL CARE MOUTH RINSE
15.0000 mL | Freq: Two times a day (BID) | OROMUCOSAL | Status: DC
  Administered 2020-06-02 – 2020-06-06 (×9): 15 mL via OROMUCOSAL

## 2020-06-02 NOTE — Progress Notes (Signed)
Subjective/Chief Complaint: NG output remains high He is still having cramping abdominal pain controlled with pain medication Denies flatus   Objective: Vital signs in last 24 hours: Temp:  [97.5 F (36.4 C)-99 F (37.2 C)] 97.5 F (36.4 C) (03/27 0412) Pulse Rate:  [75-87] 87 (03/27 0412) Resp:  [15-21] 18 (03/27 0412) BP: (86-143)/(65-90) 109/68 (03/27 0412) SpO2:  [87 %-100 %] 97 % (03/27 0732) FiO2 (%):  [28 %] 28 % (03/27 0732) Weight:  [93 kg] 93 kg (03/26 1256) Last BM Date: 05/31/20  Intake/Output from previous day: 03/26 0701 - 03/27 0700 In: 334.9 [IV Piggyback:334.9] Out: 1900 [Urine:900; Emesis/NG output:1000] Intake/Output this shift: No intake/output data recorded.  Exam: Awake and alert Abdomen is mildly distended with mild tenderness but no frank peritonitis  Lab Results:  Recent Labs    06/01/20 1307 06/02/20 0346  WBC 3.5* 9.6  HGB 14.5 11.8*  HCT 43.5 34.9*  PLT 191 154   BMET Recent Labs    06/01/20 1307 06/02/20 0346  NA 135 137  K 3.5 3.9  CL 91* 96*  CO2 30 29  GLUCOSE 131* 121*  BUN 24* 36*  CREATININE 1.43* 1.57*  CALCIUM 11.1* 9.8   PT/INR No results for input(s): LABPROT, INR in the last 72 hours. ABG No results for input(s): PHART, HCO3 in the last 72 hours.  Invalid input(s): PCO2, PO2  Studies/Results: DG Abd 1 View  Result Date: 06/01/2020 CLINICAL DATA:  NG tube placement. EXAM: ABDOMEN - 1 VIEW COMPARISON:  06/01/2020 CT and prior studies FINDINGS: An NG tube is noted with tip overlying the proximal stomach and side hole at the esophagogastric junction-recommend advancement. No other changes noted. IMPRESSION: NG tube with tip overlying the proximal stomach and side hole at the esophagogastric junction-recommend advancement. Electronically Signed   By: Margarette Canada M.D.   On: 06/01/2020 16:14   CT Abdomen Pelvis W Contrast  Result Date: 06/01/2020 CLINICAL DATA:  Abdominal pain EXAM: CT ABDOMEN AND PELVIS WITH  CONTRAST TECHNIQUE: Multidetector CT imaging of the abdomen and pelvis was performed using the standard protocol following bolus administration of intravenous contrast. CONTRAST:  164mL OMNIPAQUE IOHEXOL 300 MG/ML  SOLN COMPARISON:  04/09/2020 FINDINGS: Lower chest: Extensive, predominantly ground-glass opacity throughout the visualized right lower lobe. Left lung base is clear. Heart size within normal limits. Fluid distension of the distal esophagus. Hepatobiliary: No focal liver abnormality is seen. No gallstones, gallbladder wall thickening, or biliary dilatation. Pancreas: Unremarkable. No pancreatic ductal dilatation or surrounding inflammatory changes. Spleen: Unchanged probable flash filling hemangioma within the upper pole of the spleen. Spleen otherwise unremarkable. Adrenals/Urinary Tract: Unremarkable adrenal glands. Kidneys enhance symmetrically without focal lesion, stone, or hydronephrosis. Ureters are nondilated. Urinary bladder appears unremarkable. Stomach/Bowel: High-grade small bowel obstruction with abrupt transition point within the right lower quadrant (series 2, image 59). Small bowel dilated up to 4.1 cm in diameter with air-fluid levels. Stomach and distal esophagus are dilated and fluid-filled. Distal small bowel and colon are decompressed. Extensive colonic diverticulosis. Vascular/Lymphatic: Scattered aortoiliac atherosclerotic calcifications without aneurysm. No abdominopelvic lymphadenopathy. Reproductive: Brachytherapy seeds within the prostate gland. Other: Small volume ascites.  No pneumoperitoneum. Musculoskeletal: No acute or significant osseous findings. Moderate degenerative changes of the bilateral hips. Scattered lumbar spondylosis. IMPRESSION: 1. High-grade small bowel obstruction with abrupt transition point within the right lower quadrant. Surgical evaluation recommended. 2. Small volume ascites, likely reactive. 3. Extensive, predominantly ground-glass opacity throughout  the visualized right lower lobe, suspicious for pneumonia. Radiographic follow-up to  resolution recommended. 4. Extensive colonic diverticulosis without evidence of acute diverticulitis. 5. Aortic atherosclerosis (ICD10-I70.0). These results were called by telephone at the time of interpretation on 06/01/2020 at 2:29 pm to provider MELANIE BELFI , who verbally acknowledged these results. Electronically Signed   By: Davina Poke D.O.   On: 06/01/2020 14:30   DG CHEST PORT 1 VIEW  Result Date: 06/01/2020 CLINICAL DATA:  Short of breath, cough EXAM: PORTABLE CHEST 1 VIEW COMPARISON:  06/01/2020, 02/17/2017 FINDINGS: Single frontal view of the chest demonstrates enteric catheter passing below diaphragm tip excluded by collimation. Dual lead pacer overlies left chest. Cardiac silhouette is unremarkable. There is persistent right basilar airspace disease unchanged since recent CT. No effusion or pneumothorax. No acute bony abnormalities. IMPRESSION: 1. Persistent right basilar airspace disease consistent with pneumonia. Electronically Signed   By: Randa Ngo M.D.   On: 06/01/2020 18:36   DG Abd Portable 1V-Small Bowel Obstruction Protocol-initial, 8 hr delay  Result Date: 06/02/2020 CLINICAL DATA:  Small bowel obstruction EXAM: PORTABLE ABDOMEN - 1 VIEW COMPARISON:  06/01/2020 FINDINGS: Enteric tube terminates in the proximal gastric body. Contrast in the gastric cardia. Multiple dilated loops of small bowel in the central abdomen, compatible with persistent small bowel obstruction. Excretory contrast in the bladder. IMPRESSION: Multiple dilated loops of small bowel in the central abdomen, compatible with persistent small bowel obstruction. Electronically Signed   By: Julian Hy M.D.   On: 06/02/2020 06:30   DG Abd Portable 1V-Small Bowel Protocol-Position Verification  Result Date: 06/01/2020 CLINICAL DATA:  Enteric catheter advancement EXAM: PORTABLE ABDOMEN - 1 VIEW COMPARISON:  06/01/2020 at  4:07 p.m. FINDINGS: Frontal view of the lower chest and upper abdomen demonstrates enteric catheter tip and side port projecting over the gastric antrum. Small-bowel obstruction again noted. Areas of consolidation are seen at the lung bases, right greater than left, stable. IMPRESSION: 1. Enteric catheter tip and side port projecting over gastric antrum. 2. Persistent small bowel obstruction. 3. Persistent bibasilar airspace disease. Electronically Signed   By: Randa Ngo M.D.   On: 06/01/2020 18:34    Anti-infectives: Anti-infectives (From admission, onward)   Start     Dose/Rate Route Frequency Ordered Stop   06/01/20 1930  Ampicillin-Sulbactam (UNASYN) 3 g in sodium chloride 0.9 % 100 mL IVPB        3 g 200 mL/hr over 30 Minutes Intravenous Every 6 hours 06/01/20 1842        Assessment/Plan: Small bowel obstruction Pneumonia  His abdominal x-ray this morning still shows contrast in the stomach and dilation of loops of small bowel.  Clinically, he does not seem to be improving. Plan will be to continue IV rehydration with bowel rest and nasogastric suctioning and will repeat x-rays in the morning. I suspect he will require surgery tomorrow unless he acutely decompensates today.  Again, he has been on Eliquis which I believe he last took Friday night.  LOS: 1 day    Coralie Keens 06/02/2020

## 2020-06-02 NOTE — Progress Notes (Addendum)
PROGRESS NOTE    Todd Chapman  ASN:[id number] DOB: 08-Dec-1945 DOA: 06/01/2020 PCP: Todd Huddle, MD   Brief Narrative:  Todd Chapman is a 75 y.o. 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  Patient states that since January he is bowels have not been right and recently seen in the ED for ileitis and was placed on levofloxacin and metronidazole at that time.  He has been having chronic diarrhea however last night he was unable to defecate and started becoming extremely nauseous today vomiting.  Wife states that he started vomiting again this morning it was projectile vomiting.  Because of symptoms he was brought to the emergency room and was found to have a high-grade small bowel obstruction as well as an aspiration pneumonia.  He still describes diffuse abdominal pain but states is better with the pain regimen.  He complains of some congestion and some mild shortness of breath especially when he walks.  Denies any other concerns or points at this time.  TR[facility] was asked to admit this patient for the above symptoms  ED Course: In the ED patient had basic blood work done and had a CT of the abdomen pelvis.  He had NG tube placed and was transferred to [facility] Covid testing was negative. General surgery was consulted  **Interim History  General Surgery was consulted and he underwent a small bowel protocol and repeat KUB shows multiple dilated loops of small bowel in the central abdomen compatible with persistent small bowel obstruction.  Patient is clinically not improving today so we will give him a more bowel rest and continue with IV fluid  hydration and NG tube decompression and suctioning.  We will repeat his KUB in the morning and if he is not improved, General Surgery suspects that they will take him for surgical intervention as his last dose of Eliquis was the night before last  Assessment & Plan:   Active Problems:   SBO (small bowel obstruction) (HCC)  High-Grade SBO, poA -Has a Hx of appendectomy but recently has been getting radiation treatment for prostate cancer -CT Scan shows abrupt Transition point in the RLQ -NG Placed and DG Abdomen done and showed "NG tube with tip overlying the proximal stomach and side hole at the esophagogastric junction-recommend advancement.";  [facility] and will be obtaining repeat DG Abdomen -Had 1300 mL out when NG was palced -NPO and Supportive Care -C/w Antiementics -Ensure Mag >2.0 and K+ >4.0; K+ was 3.9 today and Mag Level was 2.3 -Bolus 1 Liter now and Start Maintenance IVF with NS at 75 mL/hr -Pain Control with IV Morphine 2 mg q2h -Antiemetics with IV Ondansetron -Acetaminophen 650 po/RC -Will consult General Surgery for further evaluation and Recc's  -Surgery is initiating small bowel protocol currently; Repeat KUB shows persistent small bowel obstruction and if he does not improve by tomorrow general surgery is going to anticipate taking him for surgical intervention -Hold off on all p.o. medications and hold off his anticoagulation  RLL PNA, poA likley in the setting of Aspiration from Nausea and Vomiting Acute respiratory failure with hypoxia in the setting of Aspiration Pneumonia -Patient has been nauseous and  vomiting severely with projectile vomiting -Likely aspirated -Aspiration precautions -Start Xopenex and Atrovent every 6 scheduled and also order flutter valve, incentive spirometry -Check strep and Legionella urine antigens; SARS COV-2 Negative  -Empirically start antibiotics with IV Unasyn -Blood Cx x2 Pending   -Continue wearing 2 L of supplemental oxygen -SpO2: 97 % O2 Flow Rate (L/min): 2 L/min FiO2 (%): 28 % -Continue to Monitor Respiratory Status Carefully -Obtain CXR now   Prostate Cancer -Holding Tamsulosin as he is NPO and resume when tolerating Diet -Recently completed Radiation Tx 12 Days ago -Will need to Follow up with Radiation Onc and Urology as an outpatient   AKI, worsening  Hyperphosphatemia -The setting of dehydration as patient's BUNs/creatinine is 24/1.43 -> 36/1.57 -Phos worsened in the setting of AKI and Phos Level is 4.7 -IVF Hydration as above -Avoid Nephrotoxic Medications, Contrast Dyes, Hypotension and Renally Adjust Medications -Continue to Monitor and Trend Renal Fxn Carefully  -Repeat CMP in the AM   Hyperbilirubinemia -Patient's T Bili is 2.0 on admission and worsened to 2.3 -IVF Hydration as above -Will consider obtaining a RUQ U/S -Continue to Monitor and Trend -Repeat CMP in the AM  Persistent Atrial Fibrillation Bradycardia s/p PPM  -Continue to Monitor on Telemetry  -Holding Anticoagulation as above  Normocytic Anemia -Patient's Hgb/Hct went from 14.5/43.5 -> 11.8/34.9 -Check Anemia Panel in the AM -Continue to Monitor for S/Sx of Bleeding; No overt bleeding noted -Anticoagulation has been held -Repeat CBC in the AM    Leukopenia -Improved and WBC went from 3.5 -> 9.6 -On IV Unasyn as above -Continue to Monitor and Trend -Repeat WBC in the AM  Hypercalcemia -In the setting of Dehydration form Nausea and Vomiting -Improved with IVF -Ca2+ went from 11.1 -> 9.8 -Repeat CMP in the AM   PreDiabetes -Patient's HgbA1c was 5.9 -Blood Sugars ranging from 121-131 on daily BMP/CMP; CBGs ranging from 88-132 -Continue to Monitor Blood Sugars carefully and if necessary will place on Sensitive Novolog SSI AC  GERD -Change po PPI to IV Pantoprazole 40 mg po q24h  HLD -Hold Statin while NPO  DVT prophylaxis: SCDs Code Status: FULL  CODE Family Communication: No family present at bedside but updated Todd Chapman over the telephone Disposition Plan: Pending further clinical improvement and clearance by General Surgery  Status is: Inpatient  Remains inpatient appropriate because:Unsafe d/c plan, IV treatments appropriate due to intensity of illness or inability to take PO and Inpatient level of care appropriate due to severity of illness   Dispo: The patient is from: Home              Anticipated d/c is to: Home              Patient currently is not medically stable to d/c.   Difficult to place patient No   Consultants:   General Surgery   Procedures: None  Antimicrobials:  Anti-infectives (From admission, onward)   Start     Dose/Rate Route Frequency Ordered Stop   06/01/20 1930  Ampicillin-Sulbactam (UNASYN) 3 g in sodium chloride 0.9 % 100 mL IVPB        3 g 200 mL/hr over 30 Minutes Intravenous Every 6 hours 06/01/20 1842          Subjective: Seen and examined at bedside and still states that he had significant abdominal pain that was controlled by the IV pain meds but states that the pain meds wear off quickly.  No nausea or vomiting and still continues to have a  lot in his NG canister.  Does not feel well and hopeful to avoid surgery.  Denies any chest pain or shortness breath and has not been coughing up any sputum.  No other concerns or complaints at this time.  Objective: Vitals:   06/01/20 2014 06/02/20 0001 06/02/20 0412 06/02/20 0732  BP: 115/75 99/67 109/68   Pulse: 76 75 87   Resp: 18  18   Temp: 98.6 F (37 C) 99 F (37.2 C) (!) 97.5 F (36.4 C)   TempSrc: Oral Oral Axillary   SpO2: 100% 97% 95% 97%  Weight:      Height:        Intake/Output Summary (Last 24 hours) at 06/02/2020 1114 Last data filed at 06/02/2020 1030 Gross per 24 hour  Intake 334.89 ml  Output 2200 ml  Net -1865.11 ml   Filed Weights   06/01/20 1256  Weight: 93 kg   Examination: Physical Exam:  Constitutional:  WN/WD overweight Caucasian male in NAD appears mildly uncomfortable and has NGT in place Eyes: Lids and conjunctivae normal, sclerae anicteric  ENMT: External Ears, Nose appear normal. Grossly normal hearing.  Neck: Appears normal, supple, no cervical masses, normal ROM, no appreciable thyromegaly; no JVD Respiratory: Diminished to auscultation bilaterally worse on the Right, no wheezing, rales, rhonchi or crackles. Normal respiratory effort and patient is not tachypenic. No accessory muscle use. Wearing Supplemental O2 via [place] Cardiovascular: Irregularly Irregular, no murmurs / rubs / gallops. S1 and S2 auscultated. Trace edema Abdomen: Soft,Tender, Distended 2/2 body habitus.  Bowel sounds positive.  GU: Deferred. Musculoskeletal: No clubbing / cyanosis of digits/nails. No joint deformity upper and lower extremities.  Skin: No rashes, lesions, ulcers on a limited skin evaluation. No induration; Warm and dry.  Neurologic: CN 2-12 grossly intact with no focal deficits. Romberg sign and cerebellar reflexes not assessed.  Psychiatric: Normal judgment and insight. Alert and oriented x 3. Mildy anxious mood and appropriate affect.   Data Reviewed: I have personally reviewed following labs and imaging studies  CBC: Recent Labs  Lab 06/01/20 1307 06/02/20 0346  WBC 3.5* 9.6  NEUTROABS 2.8  --   HGB 14.5 11.8*  HCT 43.5 34.9*  MCV 92.9 94.1  PLT 191 622   Basic Metabolic Panel: Recent Labs  Lab 06/01/20 1307 06/02/20 0346  NA 135 137  K 3.5 3.9  CL 91* 96*  CO2 30 29  GLUCOSE 131* 121*  BUN 24* 36*  CREATININE 1.43* 1.57*  CALCIUM 11.1* 9.8  MG  --  2.3  PHOS  --  4.7*   GFR: Estimated Creatinine Clearance: 45.8 mL/min (A) (by C-G formula based on SCr of 1.57 mg/dL (H)). Liver Function Tests: Recent Labs  Lab 06/01/20 1307 06/02/20 0346  AST 29 33  ALT 21 22  ALKPHOS 70 58  BILITOT 2.0* 2.3*  PROT 6.7 6.1*  ALBUMIN 4.4 3.4*   Recent Labs  Lab 06/01/20 1307  LIPASE  21   No results for input(s): AMMONIA in the last 168 hours. Coagulation Profile: No results for input(s): INR, PROTIME in the last 168 hours. Cardiac Enzymes: No results for input(s): CKTOTAL, CKMB, CKMBINDEX, TROPONINI in the last 168 hours. BNP (last 3 results) No results for input(s): PROBNP in the last 8760 hours. HbA1C: Recent Labs    06/02/20 0346  HGBA1C 5.9*   CBG: Recent Labs  Lab 06/01/20 2038 06/02/20 0006 06/02/20 0413 06/02/20 0751  GLUCAP 88 114* 132* 120*   Lipid Profile: No results for input(s):  CHOL, HDL, LDLCALC, TRIG, CHOLHDL, LDLDIRECT in the last 72 hours. Thyroid Function Tests: Recent Labs    06/02/20 0346  TSH 2.044   Anemia Panel: No results for input(s): VITAMINB12, FOLATE, FERRITIN, TIBC, IRON, RETICCTPCT in the last 72 hours. Sepsis Labs: No results for input(s): PROCALCITON, LATICACIDVEN in the last 168 hours.  Recent Results (from the past 240 hour(s))  Resp Panel by RT-PCR (Flu A&B, Covid) Nasopharyngeal Swab     Status: None   Collection Time: 06/01/20  3:38 PM   Specimen: Nasopharyngeal Swab; Nasopharyngeal(NP) swabs in vial transport medium  Result Value Ref Range Status   SARS Coronavirus 2 by RT PCR NEGATIVE NEGATIVE Final    Comment: (NOTE) SARS-CoV-2 target nucleic acids are NOT DETECTED.  The SARS-CoV-2 RNA is generally detectable in upper respiratory specimens during the acute phase of infection. The lowest concentration of SARS-CoV-2 viral copies this assay can detect is 138 copies/mL. A negative result does not preclude SARS-Cov-2 infection and should not be used as the sole basis for treatment or other patient management decisions. A negative result may occur with  improper specimen collection/handling, submission of specimen other than nasopharyngeal swab, presence of viral mutation(s) within the areas targeted by this assay, and inadequate number of viral copies(<138 copies/mL). A negative result must be combined  with clinical observations, patient history, and epidemiological information. The expected result is Negative.  Fact Sheet for Patients:  EntrepreneurPulse.com.au  Fact Sheet for Healthcare Providers:  IncredibleEmployment.be  This test is no t yet approved or cleared by the [place] FDA and  has been authorized for detection and/or diagnosis of SARS-CoV-2 by FDA under an Emergency Use Authorization (EUA). This EUA will remain  in effect (meaning this test can be used) for the duration of the COVID-19 declaration under Section 564(b)(1) of the Act, 21 U.S.C.section 360bbb-3(b)(1), unless the authorization is terminated  or revoked sooner.       Influenza A by PCR NEGATIVE NEGATIVE Final   Influenza B by PCR NEGATIVE NEGATIVE Final    Comment: (NOTE) The Xpert Xpress SARS-CoV-2/FLU/RSV plus assay is intended as an aid in the diagnosis of influenza from Nasopharyngeal swab specimens and should not be used as a sole basis for treatment. Nasal washings and aspirates are unacceptable for Xpert Xpress SARS-CoV-2/FLU/RSV testing.  Fact Sheet for Patients: EntrepreneurPulse.com.au  Fact Sheet for Healthcare Providers: IncredibleEmployment.be  This test is not yet approved or cleared by the [place] FDA and has been authorized for detection and/or diagnosis of SARS-CoV-2 by FDA under an Emergency Use Authorization (EUA). This EUA will remain in effect (meaning this test can be used) for the duration of the COVID-19 declaration under Section 564(b)(1) of the Act, 21 U.S.C. section 360bbb-3(b)(1), unless the authorization is terminated or revoked.  Performed at [facility]   Culture, blood (routine x 2) Call MD if unable to obtain prior to antibiotics being given     Status: None (Preliminary result)   Collection Time: 06/01/20  6:50 PM   Specimen: BLOOD LEFT WRIST  Result Value Ref  Range Status   Specimen Description   Final    BLOOD LEFT WRIST Performed at [facility], [address], [place], [place] [address]    Special Requests   Final    BOTTLES DRAWN AEROBIC AND ANAEROBIC Blood Culture adequate volume Performed at [facility] [address] [place], [place] [address]    Culture PENDING  Incomplete   Report Status PENDING  Incomplete    RN Pressure Injury Documentation:   Estimated body mass index is 26.32 kg/m as calculated from the following:   Height as of this encounter: 6\' 2"  (1.88 m).   Weight as of this encounter: 93 kg.  Malnutrition Type:   Malnutrition Characteristics:   Nutrition Interventions    Radiology Studies: DG Abd 1 View  Result Date: 06/01/2020 CLINICAL DATA:  NG tube placement. EXAM: ABDOMEN - 1 VIEW COMPARISON:  06/01/2020 CT and prior studies FINDINGS: An NG tube is noted with tip overlying the proximal stomach and side hole at the esophagogastric junction-recommend advancement. No other changes noted. IMPRESSION: NG tube with tip overlying the proximal stomach and side hole at the esophagogastric junction-recommend advancement. Electronically Signed   By: Margarette Canada M.D.   On: 06/01/2020 16:14   CT Abdomen Pelvis W Contrast  Result Date: 06/01/2020 CLINICAL DATA:  Abdominal pain EXAM: CT ABDOMEN AND PELVIS WITH CONTRAST TECHNIQUE: Multidetector CT imaging of the abdomen and pelvis was performed using the standard protocol following bolus administration of intravenous contrast. CONTRAST:  138mL OMNIPAQUE IOHEXOL 300 MG/ML  SOLN COMPARISON:  04/09/2020 FINDINGS: Lower chest: Extensive, predominantly ground-glass opacity throughout the visualized right lower lobe. Left lung base is clear. Heart size within normal limits. Fluid distension of the distal esophagus. Hepatobiliary: No focal liver abnormality is seen. No gallstones, gallbladder wall thickening, or biliary dilatation. Pancreas: Unremarkable. No  pancreatic ductal dilatation or surrounding inflammatory changes. Spleen: Unchanged probable flash filling hemangioma within the upper pole of the spleen. Spleen otherwise unremarkable. Adrenals/Urinary Tract: Unremarkable adrenal glands. Kidneys enhance symmetrically without focal lesion, stone, or hydronephrosis. Ureters are nondilated. Urinary bladder appears unremarkable. Stomach/Bowel: High-grade small bowel obstruction with abrupt transition point within the right lower quadrant (series 2, image 59). Small bowel dilated up to 4.1 cm in diameter with air-fluid levels. Stomach and distal esophagus are dilated and fluid-filled. Distal small bowel and colon are decompressed. Extensive colonic diverticulosis. Vascular/Lymphatic: Scattered aortoiliac atherosclerotic calcifications without aneurysm. No abdominopelvic lymphadenopathy. Reproductive: Brachytherapy seeds within the prostate gland. Other: Small volume ascites.  No pneumoperitoneum. Musculoskeletal: No acute or significant osseous findings. Moderate degenerative changes of the bilateral hips. Scattered lumbar spondylosis. IMPRESSION: 1. High-grade small bowel obstruction with abrupt transition point within the right lower quadrant. Surgical evaluation recommended. 2. Small volume ascites, likely reactive. 3. Extensive, predominantly ground-glass opacity throughout the visualized right lower lobe, suspicious for pneumonia. Radiographic follow-up to resolution recommended. 4. Extensive colonic diverticulosis without evidence of acute diverticulitis. 5. Aortic atherosclerosis (ICD10-I70.0). These results were called by telephone at the time of interpretation on 06/01/2020 at 2:29 pm to provider MELANIE BELFI , who verbally acknowledged these results. Electronically Signed   By: Davina Poke D.O.   On: 06/01/2020 14:30   DG CHEST PORT 1 VIEW  Result Date: 06/01/2020 CLINICAL DATA:  Short of breath, cough EXAM: PORTABLE CHEST 1 VIEW COMPARISON:   06/01/2020, 02/17/2017 FINDINGS: Single frontal view of the chest demonstrates enteric catheter passing below diaphragm tip excluded by collimation. Dual lead pacer overlies left chest. Cardiac silhouette is unremarkable. There is persistent right basilar airspace disease unchanged since recent CT. No effusion or pneumothorax. No acute bony abnormalities. IMPRESSION: 1. Persistent right basilar airspace disease consistent with pneumonia. Electronically Signed   By: Randa Ngo M.D.   On: 06/01/2020 18:36   DG Abd Portable 1V-Small Bowel Obstruction Protocol-initial, 8 hr delay  Result Date: 06/02/2020 CLINICAL DATA:  Small bowel obstruction EXAM: PORTABLE ABDOMEN - 1 VIEW COMPARISON:  06/01/2020 FINDINGS: Enteric tube terminates in the proximal gastric body. Contrast in the gastric cardia. Multiple dilated loops of small bowel in the central abdomen, compatible with persistent small bowel obstruction. Excretory contrast in the bladder. IMPRESSION: Multiple dilated loops of small bowel in the central abdomen, compatible with persistent small bowel obstruction. Electronically Signed   By: Julian Hy M.D.   On: 06/02/2020 06:30   DG Abd Portable 1V-Small Bowel Protocol-Position Verification  Result Date: 06/01/2020 CLINICAL DATA:  Enteric catheter advancement EXAM: PORTABLE ABDOMEN - 1 VIEW COMPARISON:  06/01/2020 at 4:07 p.m. FINDINGS: Frontal view of the lower chest and upper abdomen demonstrates enteric catheter tip and side port projecting over the gastric antrum. Small-bowel obstruction again noted. Areas of consolidation are seen at the lung bases, right greater than left, stable. IMPRESSION: 1. Enteric catheter tip and side port projecting over gastric antrum. 2. Persistent small bowel obstruction. 3. Persistent bibasilar airspace disease. Electronically Signed   By: Randa Ngo M.D.   On: 06/01/2020 18:34   Scheduled Meds: . enoxaparin (LOVENOX) injection  40 mg Subcutaneous Q24H  .  ipratropium  0.5 mg Nebulization Q6H  . levalbuterol  0.63 mg Nebulization Q6H  . mouth rinse  15 mL Mouth Rinse BID  . pantoprazole (PROTONIX) IV  40 mg Intravenous QHS   Continuous Infusions: . sodium chloride 75 mL/hr at 06/01/20 2119  . ampicillin-sulbactam (UNASYN) IV 3 g (06/02/20 0534)    LOS: 1 day   Kerney Elbe, DO [facility] PAGER is on [place]  If 7PM-7AM, please contact night-coverage [email or web address]

## 2020-06-03 ENCOUNTER — Inpatient Hospital Stay (HOSPITAL_COMMUNITY): Payer: Medicare Other

## 2020-06-03 DIAGNOSIS — N179 Acute kidney failure, unspecified: Secondary | ICD-10-CM | POA: Diagnosis not present

## 2020-06-03 DIAGNOSIS — K56609 Unspecified intestinal obstruction, unspecified as to partial versus complete obstruction: Secondary | ICD-10-CM | POA: Diagnosis not present

## 2020-06-03 DIAGNOSIS — J690 Pneumonitis due to inhalation of food and vomit: Secondary | ICD-10-CM | POA: Diagnosis not present

## 2020-06-03 DIAGNOSIS — D649 Anemia, unspecified: Secondary | ICD-10-CM | POA: Diagnosis not present

## 2020-06-03 LAB — CBC WITH DIFFERENTIAL/PLATELET
Abs Immature Granulocytes: 0.1 10*3/uL — ABNORMAL HIGH (ref 0.00–0.07)
Basophils Absolute: 0 10*3/uL (ref 0.0–0.1)
Basophils Relative: 0 %
Eosinophils Absolute: 0 10*3/uL (ref 0.0–0.5)
Eosinophils Relative: 0 %
HCT: 32.6 % — ABNORMAL LOW (ref 39.0–52.0)
Hemoglobin: 10.6 g/dL — ABNORMAL LOW (ref 13.0–17.0)
Immature Granulocytes: 1 %
Lymphocytes Relative: 3 %
Lymphs Abs: 0.2 10*3/uL — ABNORMAL LOW (ref 0.7–4.0)
MCH: 31.6 pg (ref 26.0–34.0)
MCHC: 32.5 g/dL (ref 30.0–36.0)
MCV: 97.3 fL (ref 80.0–100.0)
Monocytes Absolute: 0.5 10*3/uL (ref 0.1–1.0)
Monocytes Relative: 6 %
Neutro Abs: 6.9 10*3/uL (ref 1.7–7.7)
Neutrophils Relative %: 90 %
Platelets: 132 10*3/uL — ABNORMAL LOW (ref 150–400)
RBC: 3.35 MIL/uL — ABNORMAL LOW (ref 4.22–5.81)
RDW: 13.7 % (ref 11.5–15.5)
WBC: 7.8 10*3/uL (ref 4.0–10.5)
nRBC: 0 % (ref 0.0–0.2)

## 2020-06-03 LAB — COMPREHENSIVE METABOLIC PANEL
ALT: 19 U/L (ref 0–44)
AST: 25 U/L (ref 15–41)
Albumin: 3.2 g/dL — ABNORMAL LOW (ref 3.5–5.0)
Alkaline Phosphatase: 53 U/L (ref 38–126)
Anion gap: 7 (ref 5–15)
BUN: 33 mg/dL — ABNORMAL HIGH (ref 8–23)
CO2: 31 mmol/L (ref 22–32)
Calcium: 10.1 mg/dL (ref 8.9–10.3)
Chloride: 101 mmol/L (ref 98–111)
Creatinine, Ser: 1.23 mg/dL (ref 0.61–1.24)
GFR, Estimated: >60 mL/min (ref >60)
Glucose, Bld: 119 mg/dL — ABNORMAL HIGH (ref 70–99)
Potassium: 3.5 mmol/L (ref 3.5–5.1)
Sodium: 139 mmol/L (ref 135–145)
Total Bilirubin: 2.2 mg/dL — ABNORMAL HIGH (ref 0.3–1.2)
Total Protein: 5.9 g/dL — ABNORMAL LOW (ref 6.5–8.1)

## 2020-06-03 LAB — RETICULOCYTES
Immature Retic Fract: 16.1 % — ABNORMAL HIGH (ref 2.3–15.9)
RBC.: 3.35 MIL/uL — ABNORMAL LOW (ref 4.22–5.81)
Retic Count, Absolute: 37.5 10*3/uL (ref 19.0–186.0)
Retic Ct Pct: 1.1 % (ref 0.4–3.1)

## 2020-06-03 LAB — URINALYSIS, ROUTINE W REFLEX MICROSCOPIC
Bacteria, UA: NONE SEEN
Bilirubin Urine: NEGATIVE
Glucose, UA: NEGATIVE mg/dL
Ketones, ur: NEGATIVE mg/dL
Leukocytes,Ua: NEGATIVE
Nitrite: NEGATIVE
Protein, ur: 30 mg/dL — AB
Specific Gravity, Urine: 1.026 (ref 1.005–1.030)
pH: 6 (ref 5.0–8.0)

## 2020-06-03 LAB — FERRITIN: Ferritin: 216 ng/mL (ref 24–336)

## 2020-06-03 LAB — STREP PNEUMONIAE URINARY ANTIGEN: Strep Pneumo Urinary Antigen: NEGATIVE

## 2020-06-03 LAB — PHOSPHORUS: Phosphorus: 2.5 mg/dL (ref 2.5–4.6)

## 2020-06-03 LAB — IRON AND TIBC
Iron: 9 ug/dL — ABNORMAL LOW (ref 45–182)
Saturation Ratios: 4 % — ABNORMAL LOW (ref 17.9–39.5)
TIBC: 231 ug/dL — ABNORMAL LOW (ref 250–450)
UIBC: 222 ug/dL

## 2020-06-03 LAB — MAGNESIUM: Magnesium: 2.4 mg/dL (ref 1.7–2.4)

## 2020-06-03 LAB — FOLATE: Folate: 9.3 ng/mL (ref >5.9)

## 2020-06-03 LAB — GLUCOSE, CAPILLARY: Glucose-Capillary: 112 mg/dL — ABNORMAL HIGH (ref 70–99)

## 2020-06-03 LAB — VITAMIN B12: Vitamin B-12: 235 pg/mL (ref 180–914)

## 2020-06-03 IMAGING — DX DG ABD PORTABLE 1V
2 series · 2 of 2 positions shown · non-contrast
Comparison: [DATE]

CLINICAL DATA: Small-bowel obstruction

EXAM:
PORTABLE ABDOMEN - 1 VIEW

[abdomen kub (1 of 2)]
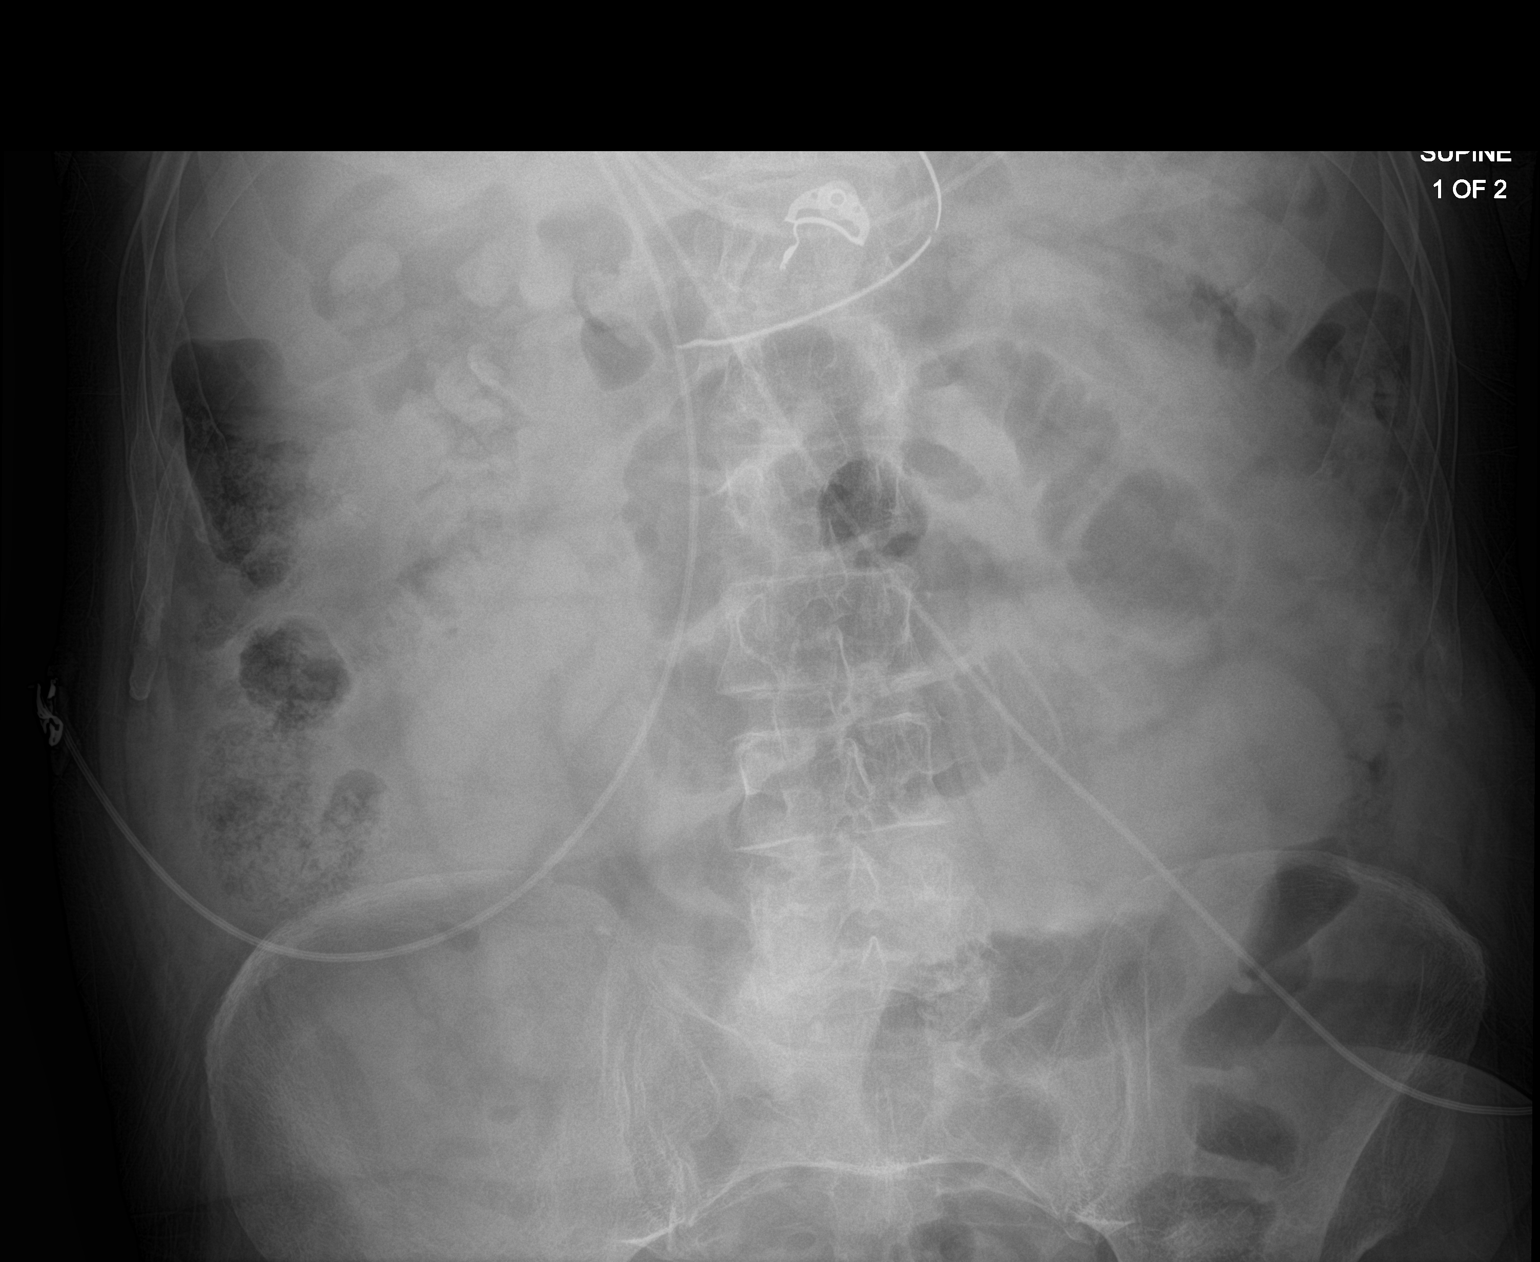

[abdomen kub (2 of 2)]
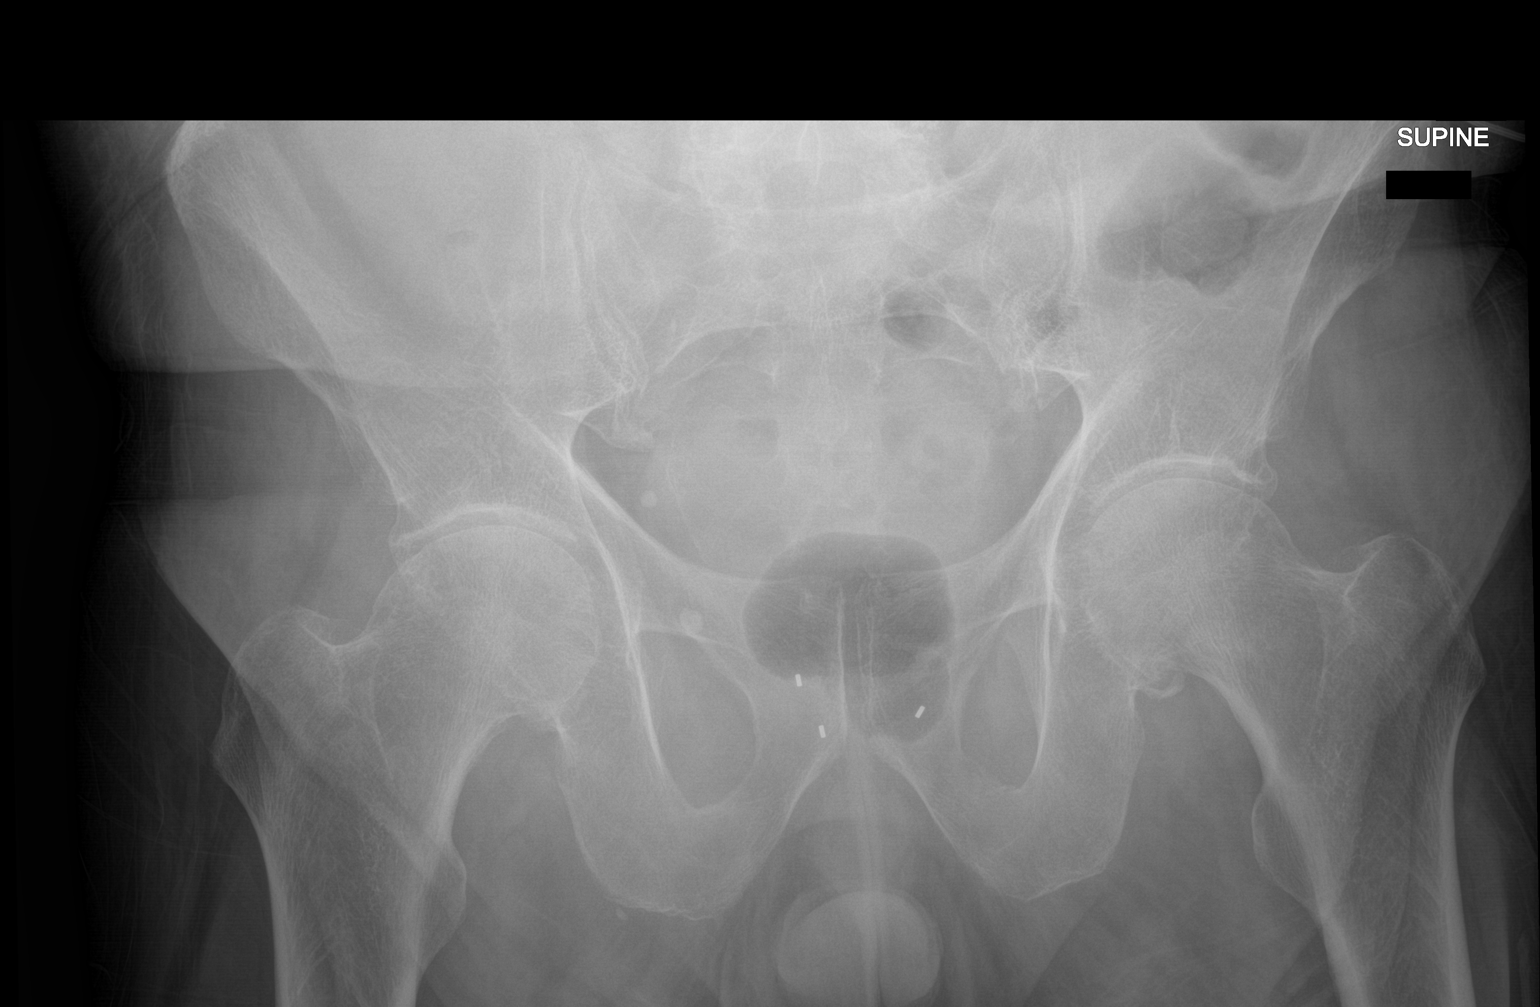

[2 of 2 positions shown; findings below may reference images not displayed]

FINDINGS: Nasogastric tube tip overlies the expected mid body of the stomach.
Multiple gas-filled mildly dilated loops of small bowel are seen
within the mid abdomen, improved in caliber since prior examination.
There is gas and stool seen throughout the colon, unchanged from
prior examination. The findings are, together, in keeping with
changes of a resolving small bowel obstruction. No gross free
intraperitoneal gas. Brachytherapy seeds noted within the prostate
gland.
IMPRESSION: Nasogastric tube in expected position. Decreasing caliber of dilated
loops of small bowel in keeping with resolving small bowel
obstruction.

## 2020-06-03 MED ORDER — POTASSIUM CHLORIDE 10 MEQ/100ML IV SOLN
10.0000 meq | INTRAVENOUS | Status: AC
  Administered 2020-06-03 (×4): 10 meq via INTRAVENOUS
  Filled 2020-06-03: qty 100

## 2020-06-03 MED ORDER — LEVALBUTEROL HCL 0.63 MG/3ML IN NEBU
0.6300 mg | INHALATION_SOLUTION | Freq: Three times a day (TID) | RESPIRATORY_TRACT | Status: DC
  Administered 2020-06-04 – 2020-06-05 (×3): 0.63 mg via RESPIRATORY_TRACT
  Filled 2020-06-03 (×4): qty 3

## 2020-06-03 MED ORDER — IPRATROPIUM BROMIDE 0.02 % IN SOLN
0.5000 mg | Freq: Three times a day (TID) | RESPIRATORY_TRACT | Status: DC
  Administered 2020-06-04 – 2020-06-05 (×3): 0.5 mg via RESPIRATORY_TRACT
  Filled 2020-06-03 (×4): qty 2.5

## 2020-06-03 MED ORDER — MORPHINE SULFATE (PF) 2 MG/ML IV SOLN
1.0000 mg | INTRAVENOUS | Status: DC | PRN
  Administered 2020-06-03 (×2): 1 mg via INTRAVENOUS
  Administered 2020-06-04: 2 mg via INTRAVENOUS
  Administered 2020-06-04 (×2): 1 mg via INTRAVENOUS
  Administered 2020-06-05: 2 mg via INTRAVENOUS
  Administered 2020-06-05 (×2): 1 mg via INTRAVENOUS
  Filled 2020-06-03 (×7): qty 1

## 2020-06-03 NOTE — Progress Notes (Signed)
PROGRESS NOTE    Todd Chapman  ZOX:[id number] DOB: Oct 09, 1945 DOA: 06/01/2020 PCP: [name], MD   Brief Narrative:  Todd Chapman is a 75 y.o. 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  Patient states that since January he is bowels have not been right and recently seen in the ED for ileitis and was placed on levofloxacin and metronidazole at that time.  He has been having chronic diarrhea however last night he was unable to defecate and started becoming extremely nauseous today vomiting.  Wife states that he started vomiting again this morning it was projectile vomiting.  Because of symptoms he was brought to the emergency room and was found to have a high-grade small bowel obstruction as well as an aspiration pneumonia.  He still describes diffuse abdominal pain but states is better with the pain regimen.  He complains of some congestion and some mild shortness of breath especially when he walks.  Denies any other concerns or points at this time.  TRH was asked to admit this patient for the above symptoms  ED Course: In the ED patient had basic blood work done and had a CT of the abdomen pelvis.  He had NG tube placed and was transferred to [facility] Covid testing was negative. General surgery was consulted  **Interim History  General Surgery was consulted and he underwent a small bowel protocol and repeat KUB shows multiple dilated loops of small bowel in the central abdomen compatible with persistent small bowel obstruction.  Patient is clinically not improving today so we will give him a more bowel rest and continue with IV fluid  hydration and NG tube decompression and suctioning.   Patient's KUB shows improvement and surgery recommending continue NG drainage and decompression.  His urine was a little pink this morning so we will obtain a urinalysis.  He had some minimal flatus today.  General surgery is adjusting his pain regimen and change it to 1 to 2 mg every 2 hours as needed for severe pain.  They will allow the patient a cup of ice.  He has been ambulating today in the hallways.  Assessment & Plan:   Active Problems:   SBO (small bowel obstruction) (HCC)  High-Grade SBO, poA -Has a Hx of appendectomy but recently has been getting radiation treatment for prostate cancer -CT Scan shows abrupt Transition point in the RLQ -NG Placed and DG Abdomen done and showed "NG tube with tip overlying the proximal stomach and side hole at the esophagogastric junction-recommend advancement.";  [facility] and will be obtaining repeat DG Abdomen -Continues have significant output from his NG tube -NPO and Supportive Care -C/w Antiementics -Ensure Mag >2.0 and K+ >4.0; K+ was 3.5 today and Mag Level was 2.4 -We will replete with IV KCl 40 mEq x 1 -Bolused 1 Liter; C/w Maintenance IVF with NS at 75 mL/hr -Pain Control with IV Morphine 2 mg q2hprn but have adjusted this to 1 to 2 mg every 2 hours as needed -Antiemetics with IV Ondansetron -Acetaminophen 650 po/RC -Will consult General Surgery for further evaluation and Recc's  -Surgery is initiating small bowel protocol currently; Repeat KUB shows  persistent small bowel obstruction and if he does not improve by tomorrow general surgery is going to anticipate taking him for surgical intervention -Hold off on all p.o. medications and hold off his anticoagulation -Continue with mobilization and ambulation  RLL PNA, poA likley in the setting of Aspiration from Nausea and Vomiting Acute respiratory failure with hypoxia in the setting of  Aspiration Pneumonia -Patient has been nauseous and vomiting severely with projectile vomiting -Likely aspirated -Aspiration precautions -Start Xopenex and Atrovent every 6 scheduled and also order flutter valve, incentive spirometry -Check strep and Legionella urine antigens; SARS COV-2 Negative  -Empirically start antibiotics with IV Unasyn for 5 days -Blood Cx x2 showed no growth to date at less than 24 hours -Continue wearing 2 L of supplemental oxygen -SpO2: 95 % O2 Flow Rate (L/min): 2 L/min FiO2 (%): 28 %; was weaned off of oxygen this morning but getting get hooked back up. -Continue to Monitor Respiratory Status Carefully -Repeat chest x-ray in the a.m.  Prostate Cancer -Holding Tamsulosin as he is NPO and resume when tolerating Diet -Recently completed Radiation Tx 12 Days ago -Will need to Follow up with Radiation Onc and Urology as an outpatient   AKI, initially worsened but now improving Hyperphosphatemia -The setting of dehydration as patient's BUNs/creatinine is 24/1.43 -> 36/1.57 and today it is 33/1.23 -Phos worsened in the setting of AKI and Phos Level is 4.7 and has now improved to 2.5 -IVF Hydration as above with normal saline at 75 MLS per hour -Avoid Nephrotoxic Medications, Contrast Dyes, Hypotension and Renally Adjust Medications -Continue to Monitor and Trend Renal Fxn Carefully  -Repeat CMP in the AM   Hyperbilirubinemia -Patient's T Bili is 2.0 on admission and worsened to 2.3 yesterday and today is now 2.2 -IVF Hydration as above -Will consider obtaining a RUQ U/S if not improving significantly -Continue to Monitor and Trend -Repeat CMP in the AM  Persistent Atrial Fibrillation Bradycardia s/p PPM  -Continue to Monitor on Telemetry  -Holding Anticoagulation as above  Normocytic Anemia -Patient's Hgb/Hct went from 14.5/43.5 -> 11.8/34.9 -> 10.6/32.6 -Checked Anemia Panel and was showing an iron level of 9, U IBC of 222, TIBC of 231, saturation  ratios of 4%, ferritin level 216, folate of 9.3, vitamin B12 level of 235 -Continue to Monitor for S/Sx of Bleeding; No overt bleeding noted -Anticoagulation has been held -Repeat CBC in the AM    Leukopenia -Improved and WBC went from 3.5 -> 9.6 -> 7.8 -On IV Unasyn as above -Continue to Monitor and Trend -Repeat WBC in the AM  Hypercalcemia -In the setting of Dehydration form Nausea and Vomiting -Improved with IVF -Ca2+ went from 11.1 -> 9.8 -> 10.1 -Repeat CMP in the AM   PreDiabetes -Patient's HgbA1c was 5.9 -Blood Sugars ranging from 121-131 on daily BMP/CMP; CBGs ranging from 1 12-1 32,000 -Continue to Monitor Blood Sugars carefully and if necessary will place on Sensitive Novolog SSI AC  GERD -Change po PPI to IV Pantoprazole 40 mg po q24h  HLD -Hold Statin while NPO and resume when able to tolerate  Pink urine -Obtain a urinalysis -His anticoagulation has been held and he is only on enoxaparin 40 mg subcu every 24 -Continue to monitor and observe closely  DVT prophylaxis: SCDs Code Status: FULL CODE Family Communication: Discussed with his wife [name] at bedside Disposition Plan: Pending further clinical improvement and clearance by General Surgery  Status is: Inpatient  Remains inpatient appropriate because:Unsafe d/c plan, IV treatments appropriate due to  intensity of illness or inability to take PO and Inpatient level of care appropriate due to severity of illness   Dispo: The patient is from: Home              Anticipated d/c is to: Home              Patient currently is not medically stable to d/c.   Difficult to place patient No   Consultants:   General Surgery   Procedures: None  Antimicrobials:  Anti-infectives (From admission, onward)   Start     Dose/Rate Route Frequency Ordered Stop   06/01/20 1930  Ampicillin-Sulbactam (UNASYN) 3 g in sodium chloride 0.9 % 100 mL IVPB        3 g 200 mL/hr over 30 Minutes Intravenous Every 6 hours  06/01/20 1842          Subjective: Seen and examined at bedside and he feels he is doing much better today and he states he actually passed some gas.  He has been ambulating the halls and he is coughed up some of his congestion.  States his abdominal pain has improved and is not as significant.  No other concerns or plans at this time  Objective: Vitals:   06/03/20 0014 06/03/20 0435 06/03/20 0500 06/03/20 0753  BP: 100/64 115/78    Pulse: 83 71    Resp: 20 16    Temp: 98.2 F (36.8 C) 99.6 F (37.6 C)    TempSrc: Oral Oral    SpO2: 95% 95%  95%  Weight:   92.3 kg   Height:        Intake/Output Summary (Last 24 hours) at 06/03/2020 0848 Last data filed at 06/03/2020 1062 Gross per 24 hour  Intake 2251.25 ml  Output 3250 ml  Net -998.75 ml   Filed Weights   06/01/20 1256 06/03/20 0500  Weight: 93 kg 92.3 kg   Examination: Physical Exam:  Constitutional: WN/WD overweight Caucasian male currently no acute distress sitting in a chair at bedside and had just come back from ambulating.  NG tube has been clamped but will develop to be hooked up again.  Not wearing supplemental oxygen now Eyes: Lids and conjunctivae normal, sclerae anicteric  ENMT: External Ears, Nose appear normal. Grossly normal hearing.  NG tube is in place in the right nare Neck: Appears normal, supple, no cervical masses, normal ROM, no appreciable thyromegaly; no JVD Respiratory: Diminished to auscultation bilaterally more so on the right compared to left, no wheezing, rales, rhonchi or crackles. Normal respiratory effort and patient is not tachypenic. No accessory muscle use.  Not wearing supplemental oxygen via nasal cannula this morning and had unlabored breathing Cardiovascular: RRR, no murmurs / rubs / gallops. S1 and S2 auscultated.  No appreciable extremity edema Abdomen: Soft, very minimally tender, slightly distended secondary body habitus but not as much as yesterday. Bowel sounds positive and  slightly hypoactive.  GU: Deferred. Musculoskeletal: No clubbing / cyanosis of digits/nails. No joint deformity upper and lower extremities.  Skin: No rashes, lesions, ulcers on limited skin evaluation. No induration; Warm and dry.  Neurologic: CN 2-12 grossly intact with no focal deficits. Romberg sign and cerebellar reflexes not assessed.  Psychiatric: Normal judgment and insight. Alert and oriented x 3. Normal mood and appropriate affect.   Data Reviewed: I have personally reviewed following labs and imaging studies  CBC: Recent Labs  Lab 06/01/20 1307 06/02/20 0346 06/03/20 0351  WBC 3.5* 9.6 7.8  NEUTROABS 2.8  --  6.9  HGB 14.5 11.8* 10.6*  HCT 43.5 34.9* 32.6*  MCV 92.9 94.1 97.3  PLT 191 154 151*   Basic Metabolic Panel: Recent Labs  Lab 06/01/20 1307 06/02/20 0346 06/03/20 0351  NA 135 137 139  K 3.5 3.9 3.5  CL 91* 96* 101  CO2 30 29 31   GLUCOSE 131* 121* 119*  BUN 24* 36* 33*  CREATININE 1.43* 1.57* 1.23  CALCIUM 11.1* 9.8 10.1  MG  --  2.3 2.4  PHOS  --  4.7* 2.5   GFR: Estimated Creatinine Clearance: 58.5 mL/min (by C-G formula based on SCr of 1.23 mg/dL). Liver Function Tests: Recent Labs  Lab 06/01/20 1307 06/02/20 0346 06/03/20 0351  AST 29 33 25  ALT 21 22 19   ALKPHOS 70 58 53  BILITOT 2.0* 2.3* 2.2*  PROT 6.7 6.1* 5.9*  ALBUMIN 4.4 3.4* 3.2*   Recent Labs  Lab 06/01/20 1307  LIPASE 21   No results for input(s): AMMONIA in the last 168 hours. Coagulation Profile: No results for input(s): INR, PROTIME in the last 168 hours. Cardiac Enzymes: No results for input(s): CKTOTAL, CKMB, CKMBINDEX, TROPONINI in the last 168 hours. BNP (last 3 results) No results for input(s): PROBNP in the last 8760 hours. HbA1C: Recent Labs    06/02/20 0346  HGBA1C 5.9*   CBG: Recent Labs  Lab 06/02/20 0006 06/02/20 0413 06/02/20 0751 06/02/20 1132 06/03/20 0832  GLUCAP 114* 132* 120* 120* 112*   Lipid Profile: No results for input(s): CHOL,  HDL, LDLCALC, TRIG, CHOLHDL, LDLDIRECT in the last 72 hours. Thyroid Function Tests: Recent Labs    06/02/20 0346  TSH 2.044   Anemia Panel: Recent Labs    06/03/20 0351  VITAMINB12 235  FOLATE 9.3  FERRITIN 216  TIBC 231*  IRON 9*  RETICCTPCT 1.1   Sepsis Labs: No results for input(s): PROCALCITON, LATICACIDVEN in the last 168 hours.  Recent Results (from the past 240 hour(s))  Resp Panel by RT-PCR (Flu A&B, Covid) Nasopharyngeal Swab     Status: None   Collection Time: 06/01/20  3:38 PM   Specimen: Nasopharyngeal Swab; Nasopharyngeal(NP) swabs in vial transport medium  Result Value Ref Range Status   SARS Coronavirus 2 by RT PCR NEGATIVE NEGATIVE Final    Comment: (NOTE) SARS-CoV-2 target nucleic acids are NOT DETECTED.  The SARS-CoV-2 RNA is generally detectable in upper respiratory specimens during the acute phase of infection. The lowest concentration of SARS-CoV-2 viral copies this assay can detect is 138 copies/mL. A negative result does not preclude SARS-Cov-2 infection and should not be used as the sole basis for treatment or other patient management decisions. A negative result may occur with  improper specimen collection/handling, submission of specimen other than nasopharyngeal swab, presence of viral mutation(s) within the areas targeted by this assay, and inadequate number of viral copies(<138 copies/mL). A negative result must be combined with clinical observations, patient history, and epidemiological information. The expected result is Negative.  Fact Sheet for Patients:  EntrepreneurPulse.com.au  Fact Sheet for Healthcare Providers:  IncredibleEmployment.be  This test is no t yet approved or cleared by the [place] FDA and  has been authorized for detection and/or diagnosis of SARS-CoV-2 by FDA under an Emergency Use Authorization (EUA). This EUA will remain  in effect (meaning this test can be used) for the  duration of the COVID-19 declaration under Section 564(b)(1) of the Act, 21 U.S.C.section 360bbb-3(b)(1), unless the authorization is terminated  or revoked sooner.  Influenza A by PCR NEGATIVE NEGATIVE Final   Influenza B by PCR NEGATIVE NEGATIVE Final    Comment: (NOTE) The Xpert Xpress SARS-CoV-2/FLU/RSV plus assay is intended as an aid in the diagnosis of influenza from Nasopharyngeal swab specimens and should not be used as a sole basis for treatment. Nasal washings and aspirates are unacceptable for Xpert Xpress SARS-CoV-2/FLU/RSV testing.  Fact Sheet for Patients: EntrepreneurPulse.com.au  Fact Sheet for Healthcare Providers: IncredibleEmployment.be  This test is not yet approved or cleared by the [place] FDA and has been authorized for detection and/or diagnosis of SARS-CoV-2 by FDA under an Emergency Use Authorization (EUA). This EUA will remain in effect (meaning this test can be used) for the duration of the COVID-19 declaration under Section 564(b)(1) of the Act, 21 U.S.C. section 360bbb-3(b)(1), unless the authorization is terminated or revoked.  Performed at [facility]   Culture, blood (routine x 2) Call MD if unable to obtain prior to antibiotics being given     Status: None (Preliminary result)   Collection Time: 06/01/20  6:50 PM   Specimen: BLOOD LEFT WRIST  Result Value Ref Range Status   Specimen Description   Final    BLOOD LEFT WRIST Performed at [facility], [address], [place], [place] [address]    Special Requests   Final    BOTTLES DRAWN AEROBIC AND ANAEROBIC Blood Culture adequate volume Performed at [facility] [address], [place], [place] [address]    Culture   Final    NO GROWTH < 24 HOURS Performed at [facility] [address], [place], [place] [address]    Report Status PENDING  Incomplete  Culture, blood (routine x 2) Call MD  if unable to obtain prior to antibiotics being given     Status: None (Preliminary result)   Collection Time: 06/01/20  6:56 PM   Specimen: BLOOD  Result Value Ref Range Status   Specimen Description   Final    BLOOD RIGHT ANTECUBITAL Performed at [facility] [address] [place], [place] [address]    Special Requests   Final    BOTTLES DRAWN AEROBIC ONLY Blood Culture adequate volume Performed at [facility] [address], [place], [place] [address]    Culture   Final    NO GROWTH < 24 HOURS Performed at [facility] [address] [place],  [address]    Report Status PENDING  Incomplete    RN Pressure Injury Documentation:   Estimated body mass index is 26.13 kg/m as calculated from the following:   Height as of this encounter: 6\' 2"  (1.88 m).   Weight as of this encounter: 92.3 kg.  Malnutrition Type:   Malnutrition Characteristics:   Nutrition Interventions    Radiology Studies: DG Abd 1 View  Result Date: 06/01/2020 CLINICAL DATA:  NG tube placement. EXAM: ABDOMEN - 1 VIEW COMPARISON:  06/01/2020 CT and prior studies FINDINGS: An NG tube is noted with tip overlying the proximal stomach and side hole at the esophagogastric junction-recommend advancement. No other changes noted. IMPRESSION: NG tube with tip overlying the proximal stomach and side hole at the esophagogastric junction-recommend advancement. Electronically Signed   By: Margarette Canada M.D.   On: 06/01/2020 16:14   CT Abdomen Pelvis W Contrast  Result Date: 06/01/2020 CLINICAL DATA:  Abdominal pain EXAM: CT ABDOMEN AND PELVIS WITH CONTRAST TECHNIQUE: Multidetector CT imaging of the abdomen and pelvis  was performed using the standard protocol following bolus administration of intravenous contrast. CONTRAST:  139mL OMNIPAQUE IOHEXOL 300 MG/ML  SOLN COMPARISON:  04/09/2020 FINDINGS: Lower chest: Extensive, predominantly ground-glass opacity throughout the  visualized right lower lobe. Left lung base is clear. Heart size within normal limits. Fluid distension of the distal esophagus. Hepatobiliary: No focal liver abnormality is seen. No gallstones, gallbladder wall thickening, or biliary dilatation. Pancreas: Unremarkable. No pancreatic ductal dilatation or surrounding inflammatory changes. Spleen: Unchanged probable flash filling hemangioma within the upper pole of the spleen. Spleen otherwise unremarkable. Adrenals/Urinary Tract: Unremarkable adrenal glands. Kidneys enhance symmetrically without focal lesion, stone, or hydronephrosis. Ureters are nondilated. Urinary bladder appears unremarkable. Stomach/Bowel: High-grade small bowel obstruction with abrupt transition point within the right lower quadrant (series 2, image 59). Small bowel dilated up to 4.1 cm in diameter with air-fluid levels. Stomach and distal esophagus are dilated and fluid-filled. Distal small bowel and colon are decompressed. Extensive colonic diverticulosis. Vascular/Lymphatic: Scattered aortoiliac atherosclerotic calcifications without aneurysm. No abdominopelvic lymphadenopathy. Reproductive: Brachytherapy seeds within the prostate gland. Other: Small volume ascites.  No pneumoperitoneum. Musculoskeletal: No acute or significant osseous findings. Moderate degenerative changes of the bilateral hips. Scattered lumbar spondylosis. IMPRESSION: 1. High-grade small bowel obstruction with abrupt transition point within the right lower quadrant. Surgical evaluation recommended. 2. Small volume ascites, likely reactive. 3. Extensive, predominantly ground-glass opacity throughout the visualized right lower lobe, suspicious for pneumonia. Radiographic follow-up to resolution recommended. 4. Extensive colonic diverticulosis without evidence of acute diverticulitis. 5. Aortic atherosclerosis (ICD10-I70.0). These results were called by telephone at the time of interpretation on 06/01/2020 at 2:29 pm to  provider MELANIE BELFI , who verbally acknowledged these results. Electronically Signed   By: Davina Poke D.O.   On: 06/01/2020 14:30   DG CHEST PORT 1 VIEW  Result Date: 06/01/2020 CLINICAL DATA:  Short of breath, cough EXAM: PORTABLE CHEST 1 VIEW COMPARISON:  06/01/2020, 02/17/2017 FINDINGS: Single frontal view of the chest demonstrates enteric catheter passing below diaphragm tip excluded by collimation. Dual lead pacer overlies left chest. Cardiac silhouette is unremarkable. There is persistent right basilar airspace disease unchanged since recent CT. No effusion or pneumothorax. No acute bony abnormalities. IMPRESSION: 1. Persistent right basilar airspace disease consistent with pneumonia. Electronically Signed   By: Randa Ngo M.D.   On: 06/01/2020 18:36   DG Abd Portable 1V  Result Date: 06/03/2020 CLINICAL DATA:  Small-bowel obstruction EXAM: PORTABLE ABDOMEN - 1 VIEW COMPARISON:  06/02/2020 FINDINGS: Nasogastric tube tip overlies the expected mid body of the stomach. Multiple gas-filled mildly dilated loops of small bowel are seen within the mid abdomen, improved in caliber since prior examination. There is gas and stool seen throughout the colon, unchanged from prior examination. The findings are, together, in keeping with changes of a resolving small bowel obstruction. No gross free intraperitoneal gas. Brachytherapy seeds noted within the prostate gland. IMPRESSION: Nasogastric tube in expected position. Decreasing caliber of dilated loops of small bowel in keeping with resolving small bowel obstruction. Electronically Signed   By: Fidela Salisbury MD   On: 06/03/2020 05:55   DG Abd Portable 1V-Small Bowel Obstruction Protocol-initial, 8 hr delay  Result Date: 06/02/2020 CLINICAL DATA:  Small bowel obstruction EXAM: PORTABLE ABDOMEN - 1 VIEW COMPARISON:  06/01/2020 FINDINGS: Enteric tube terminates in the proximal gastric body. Contrast in the gastric cardia. Multiple dilated loops of  small bowel in the central abdomen, compatible with persistent small bowel obstruction. Excretory contrast in the bladder. IMPRESSION: Multiple dilated loops  of small bowel in the central abdomen, compatible with persistent small bowel obstruction. Electronically Signed   By: Julian Hy M.D.   On: 06/02/2020 06:30   DG Abd Portable 1V-Small Bowel Protocol-Position Verification  Result Date: 06/01/2020 CLINICAL DATA:  Enteric catheter advancement EXAM: PORTABLE ABDOMEN - 1 VIEW COMPARISON:  06/01/2020 at 4:07 p.m. FINDINGS: Frontal view of the lower chest and upper abdomen demonstrates enteric catheter tip and side port projecting over the gastric antrum. Small-bowel obstruction again noted. Areas of consolidation are seen at the lung bases, right greater than left, stable. IMPRESSION: 1. Enteric catheter tip and side port projecting over gastric antrum. 2. Persistent small bowel obstruction. 3. Persistent bibasilar airspace disease. Electronically Signed   By: Randa Ngo M.D.   On: 06/01/2020 18:34   Scheduled Meds: . enoxaparin (LOVENOX) injection  40 mg Subcutaneous Q24H  . ipratropium  0.5 mg Nebulization Q6H  . levalbuterol  0.63 mg Nebulization Q6H  . mouth rinse  15 mL Mouth Rinse BID  . pantoprazole (PROTONIX) IV  40 mg Intravenous QHS   Continuous Infusions: . sodium chloride 75 mL/hr at 06/03/20 0655  . ampicillin-sulbactam (UNASYN) IV Stopped (06/03/20 0535)  . potassium chloride      LOS: 2 days   Kerney Elbe, DO [facility] PAGER is on [place]  If 7PM-7AM, please contact night-coverage [email or web address]

## 2020-06-03 NOTE — Care Management Important Message (Signed)
Important Message  Patient Details IM Letter given to the Patient. Name: Todd Chapman MRN: [id number] Date of Birth: 09-28-1945   Medicare Important Message Given:  Yes     Kerin Salen 06/03/2020, 12:11 PM

## 2020-06-03 NOTE — Progress Notes (Signed)
Initial Nutrition Assessment  DOCUMENTATION CODES:   Not applicable  INTERVENTION:  - diet advancement as medically feasible/  NUTRITION DIAGNOSIS:   Inadequate oral intake related to inability to eat as evidenced by NPO status.  GOAL:   Patient will meet greater than or equal to 90% of their needs  MONITOR:   Diet advancement,Labs,Weight trends,I & O's  REASON FOR ASSESSMENT:   Malnutrition Screening Tool  ASSESSMENT:   75 y.o. 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  He presented to the ED due to intractable N/V and abdominal pain. He reported loose stools since January and recent dx of ileitis. In the ED he was found to have high-grade SBO and aspiration PNA.  He has been NPO since admission. NGT to LIS and ~300 ml in wall canister at the time of visit earlier this afternoon.   Patient sitting in chair with no family or visitors present. He reports that he has finished XRT and that during that time was experiencing both constipation and diarrhea but never vomiting. When he began vomiting a few days ago he knew something was wrong.   He reports that symptoms began on 3/21 and have worsened over time. He has been eating poorly over the past 1 week due to symptoms and last meal was breakfast on either 3/25 or 3/26. Prior to onset of symptoms, he had a great appetite.  He reports UBW of ~212 lb and that he last weighed this approximately 3-4 weeks ago. Weight today is 203 lb and weight on 3/7 was 207 lb. This indicates 4 lb weight loss (2% body weight) in the past 3 weeks; not significant for time frame.   Surgery is following. Pending plan concerning SBO.    Labs reviewed; CBGs: 120, 120, 112 mg/dl, BUN: 33 mg/dl. Medications reviewed; 40 mg IV protonix/day, 10 mEq IV KCl x4 runs 3/28.  IVF; NS @ 75 ml/hr.     NUTRITION - FOCUSED PHYSICAL  EXAM:  completed; no fat depletions, no muscle depletions.   Diet Order:   Diet Order            Diet NPO time specified  Diet effective now                 EDUCATION NEEDS:   Not appropriate for education at this time  Skin:  Skin Assessment: Reviewed RN Assessment  Last BM:  PTA (3/21 per patient report)  Height:   Ht Readings from Last 1 Encounters:  06/01/20 6\' 2"  (1.88 m)    Weight:   Wt Readings from Last 1 Encounters:  06/03/20 92.3 kg    Estimated Nutritional Needs:  Kcal:  1900-2100 kcal Protein:  90-105 grams Fluid:  >/= 2.5 L/day      Jarome Matin, MS, RD, LDN, CNSC Inpatient Clinical Dietitian RD pager # available in AMION  After hours/weekend pager # available in [facility]

## 2020-06-03 NOTE — Progress Notes (Signed)
    CC: abdominal pain  Subjective: He is sitting up in the chair, no distress.  He has no pain now, said he had pain before morphine at 7AM.  Rare BS, no flatus or BM.  He is not distended and pretty comfortable in the chair.    Objective: Vital signs in last 24 hours: Temp:  [97.8 F (36.6 C)-99.6 F (37.6 C)] 99.6 F (37.6 C) (03/28 0435) Pulse Rate:  [71-84] 71 (03/28 0435) Resp:  [16-20] 16 (03/28 0435) BP: (100-115)/(64-84) 115/78 (03/28 0435) SpO2:  [92 %-97 %] 95 % (03/28 0435) FiO2 (%):  [28 %] 28 % (03/27 1351) Weight:  [92.3 kg] 92.3 kg (03/28 0500) Last BM Date: 05/31/20 2251 IV Urine 1000 NG 2250 No Bm recorded TM 99.6, VSS, on 2l/Ord sats 95% Creatinine 43>>1.57>>1.23 AXR 3/28:  Nasogastric tube in expected position. Decreasing caliber of dilated loops of small bowel in keeping with resolving small bowel obstruction.  Intake/Output from previous day: 03/27 0701 - 03/28 0700 In: 2251.3 [I.V.:1831.3; NG/GT:20; IV Piggyback:400] Out: 3250 [Urine:1000; Emesis/NG output:2250] Intake/Output this shift: No intake/output data recorded.  General appearance: alert, cooperative and no distress Resp: Rales right base Cardio: regular rhythm GI: decreased BS, no prior abdominal surgery, he is not distended or tender.    Lab Results:  Recent Labs    06/02/20 0346 06/03/20 0351  WBC 9.6 7.8  HGB 11.8* 10.6*  HCT 34.9* 32.6*  PLT 154 132*    BMET Recent Labs    06/02/20 0346 06/03/20 0351  NA 137 139  K 3.9 3.5  CL 96* 101  CO2 29 31  GLUCOSE 121* 119*  BUN 36* 33*  CREATININE 1.57* 1.23  CALCIUM 9.8 10.1   PT/INR No results for input(s): LABPROT, INR in the last 72 hours.  Recent Labs  Lab 06/01/20 1307 06/02/20 0346 06/03/20 0351  AST 29 33 25  ALT 21 22 19   ALKPHOS 70 58 53  BILITOT 2.0* 2.3* 2.2*  PROT 6.7 6.1* 5.9*  ALBUMIN 4.4 3.4* 3.2*     Lipase     Component Value Date/Time   LIPASE 21 06/01/2020 1307     Medications: .  enoxaparin (LOVENOX) injection  40 mg Subcutaneous Q24H  . ipratropium  0.5 mg Nebulization Q6H  . levalbuterol  0.63 mg Nebulization Q6H  . mouth rinse  15 mL Mouth Rinse BID  . pantoprazole (PROTONIX) IV  40 mg Intravenous QHS   . sodium chloride 75 mL/hr at 06/03/20 0655  . 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  I would continue NG drainage/decompression, and give him some additional time to improve.       LOS: 2 days    Tonnia Bardin 06/03/2020 Please see Amion

## 2020-06-04 ENCOUNTER — Encounter (HOSPITAL_COMMUNITY): Payer: Self-pay | Admitting: Internal Medicine

## 2020-06-04 ENCOUNTER — Inpatient Hospital Stay (HOSPITAL_COMMUNITY): Payer: Medicare Other | Admitting: Registered Nurse

## 2020-06-04 ENCOUNTER — Inpatient Hospital Stay (HOSPITAL_COMMUNITY): Payer: Medicare Other

## 2020-06-04 ENCOUNTER — Encounter (HOSPITAL_COMMUNITY)
Admission: EM | Disposition: A | Discharge status: Home / Self Care | Payer: Self-pay | Source: Home / Self Care | Attending: Internal Medicine

## 2020-06-04 DIAGNOSIS — D649 Anemia, unspecified: Secondary | ICD-10-CM | POA: Diagnosis not present

## 2020-06-04 DIAGNOSIS — J690 Pneumonitis due to inhalation of food and vomit: Secondary | ICD-10-CM | POA: Diagnosis not present

## 2020-06-04 DIAGNOSIS — N179 Acute kidney failure, unspecified: Secondary | ICD-10-CM | POA: Diagnosis not present

## 2020-06-04 DIAGNOSIS — K56609 Unspecified intestinal obstruction, unspecified as to partial versus complete obstruction: Secondary | ICD-10-CM | POA: Diagnosis not present

## 2020-06-04 HISTORY — PX: LAPAROSCOPY: SHX197

## 2020-06-04 LAB — COMPREHENSIVE METABOLIC PANEL
ALT: 19 U/L (ref 0–44)
AST: 25 U/L (ref 15–41)
Albumin: 3.6 g/dL (ref 3.5–5.0)
Alkaline Phosphatase: 60 U/L (ref 38–126)
Anion gap: 12 (ref 5–15)
BUN: 30 mg/dL — ABNORMAL HIGH (ref 8–23)
CO2: 27 mmol/L (ref 22–32)
Calcium: 10.5 mg/dL — ABNORMAL HIGH (ref 8.9–10.3)
Chloride: 106 mmol/L (ref 98–111)
Creatinine, Ser: 0.87 mg/dL (ref 0.61–1.24)
GFR, Estimated: >60 mL/min (ref >60)
Glucose, Bld: 113 mg/dL — ABNORMAL HIGH (ref 70–99)
Potassium: 3.6 mmol/L (ref 3.5–5.1)
Sodium: 145 mmol/L (ref 135–145)
Total Bilirubin: 2.6 mg/dL — ABNORMAL HIGH (ref 0.3–1.2)
Total Protein: 6.7 g/dL (ref 6.5–8.1)

## 2020-06-04 LAB — CBC WITH DIFFERENTIAL/PLATELET
Abs Immature Granulocytes: 0.05 10*3/uL (ref 0.00–0.07)
Basophils Absolute: 0 10*3/uL (ref 0.0–0.1)
Basophils Relative: 0 %
Eosinophils Absolute: 0 10*3/uL (ref 0.0–0.5)
Eosinophils Relative: 0 %
HCT: 36.6 % — ABNORMAL LOW (ref 39.0–52.0)
Hemoglobin: 11.7 g/dL — ABNORMAL LOW (ref 13.0–17.0)
Immature Granulocytes: 1 %
Lymphocytes Relative: 4 %
Lymphs Abs: 0.4 10*3/uL — ABNORMAL LOW (ref 0.7–4.0)
MCH: 31.5 pg (ref 26.0–34.0)
MCHC: 32 g/dL (ref 30.0–36.0)
MCV: 98.4 fL (ref 80.0–100.0)
Monocytes Absolute: 0.6 10*3/uL (ref 0.1–1.0)
Monocytes Relative: 6 %
Neutro Abs: 8.3 10*3/uL — ABNORMAL HIGH (ref 1.7–7.7)
Neutrophils Relative %: 89 %
Platelets: 145 10*3/uL — ABNORMAL LOW (ref 150–400)
RBC: 3.72 MIL/uL — ABNORMAL LOW (ref 4.22–5.81)
RDW: 13.8 % (ref 11.5–15.5)
WBC: 9.4 10*3/uL (ref 4.0–10.5)
nRBC: 0 % (ref 0.0–0.2)

## 2020-06-04 LAB — LEGIONELLA PNEUMOPHILA SEROGP 1 UR AG: L. pneumophila Serogp 1 Ur Ag: NEGATIVE

## 2020-06-04 LAB — GLUCOSE, CAPILLARY: Glucose-Capillary: 107 mg/dL — ABNORMAL HIGH (ref 70–99)

## 2020-06-04 LAB — PHOSPHORUS: Phosphorus: 1.8 mg/dL — ABNORMAL LOW (ref 2.5–4.6)

## 2020-06-04 LAB — MAGNESIUM: Magnesium: 2.1 mg/dL (ref 1.7–2.4)

## 2020-06-04 IMAGING — DX DG CHEST 1V PORT
1 series · 1 of 1 positions shown · non-contrast
Comparison: [DATE]

CLINICAL DATA: Small-bowel obstruction, dyspnea

EXAM:
PORTABLE CHEST 1 VIEW

[chest ap]
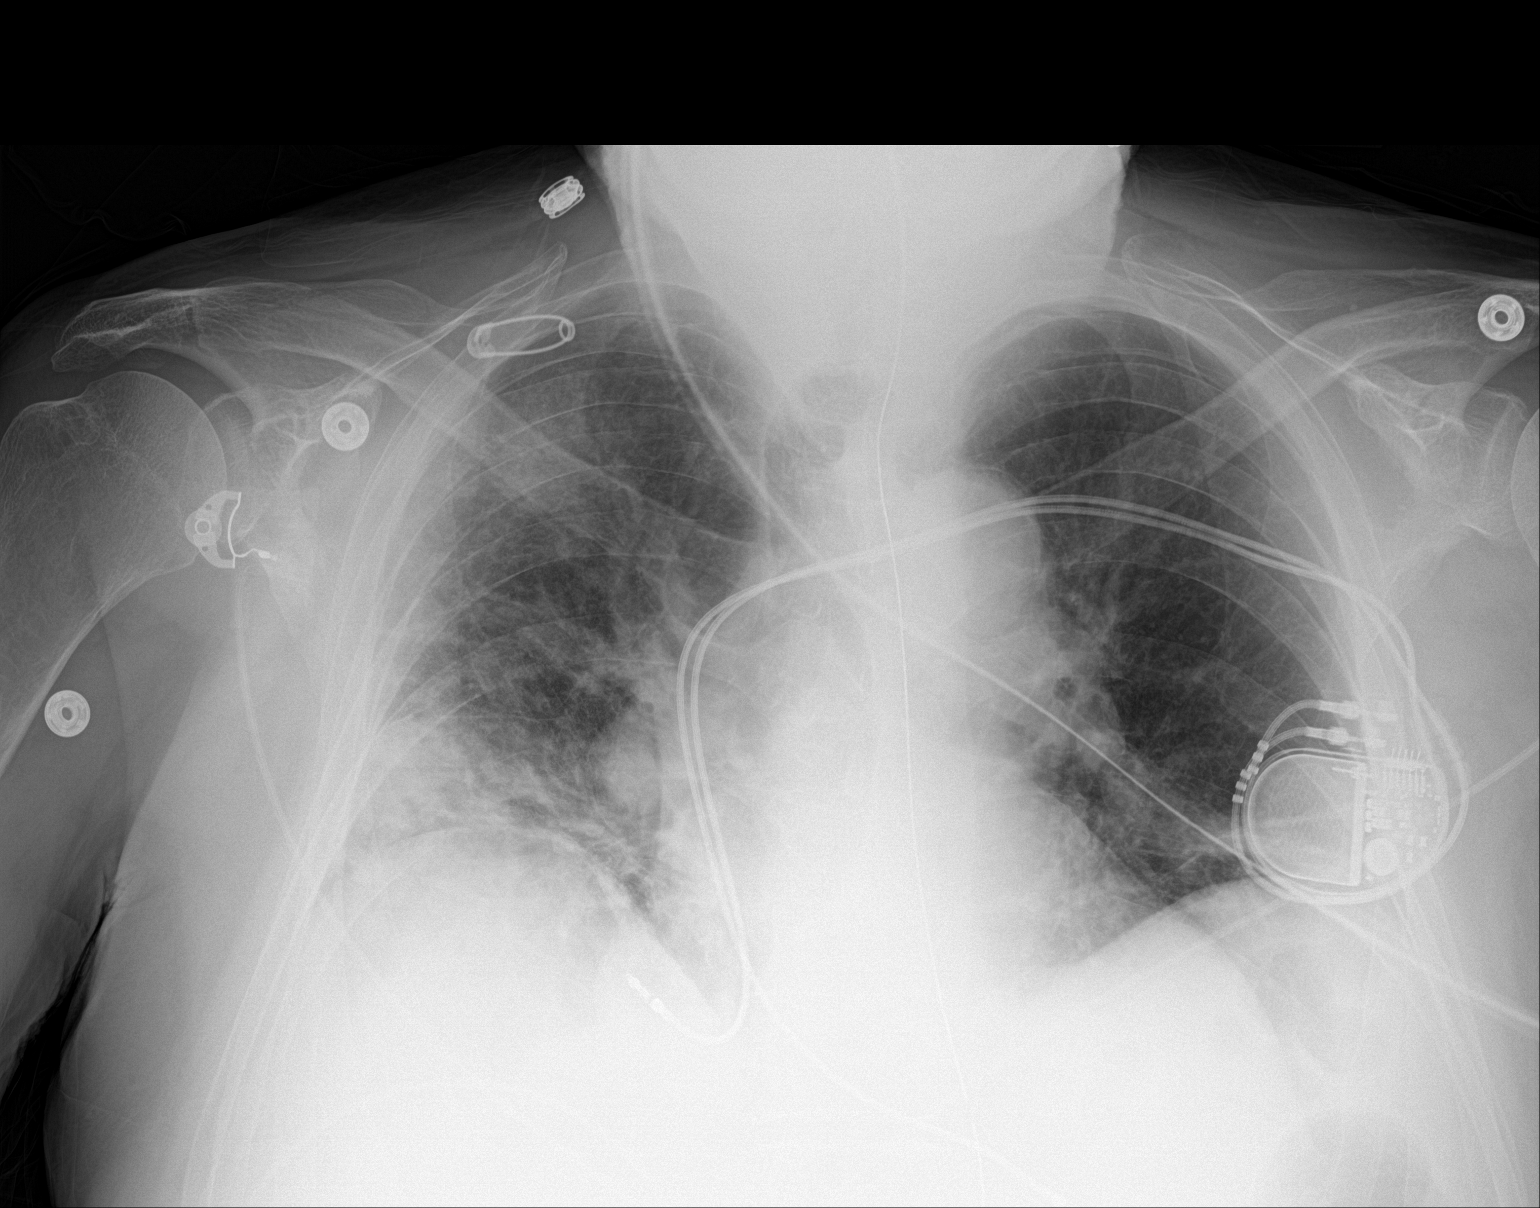

[1 of 1 positions shown; findings below may reference images not displayed]

FINDINGS: Nasogastric tube extends into the upper abdomen beyond the margin of
the examination. Lung volumes are small, but are symmetric.
Pulmonary insufflation has decreased slightly since prior
examination. There is increasing multifocal pulmonary infiltrate
throughout the right lung, likely infectious in the acute setting.
Minimal left basilar atelectasis has now developed. No pneumothorax
or pleural effusion. Cardiac size is mildly enlarged, unchanged.
Left subclavian dual lead pacemaker is unchanged. Pulmonary
vascularity is normal.
IMPRESSION: Progressive pulmonary hypoinflation.

Progressive multifocal pulmonary infiltrate within the right lung,
likely infectious.

## 2020-06-04 IMAGING — DX DG ABDOMEN 1V
1 series · 2 of 2 positions shown · non-contrast
Comparison: [DATE]

CLINICAL DATA: Small-bowel obstruction

EXAM:
ABDOMEN - 1 VIEW

[Series 1: abdomen kub · 0.14mm/px · 2 of 2 slices shown]
[im 1/2]
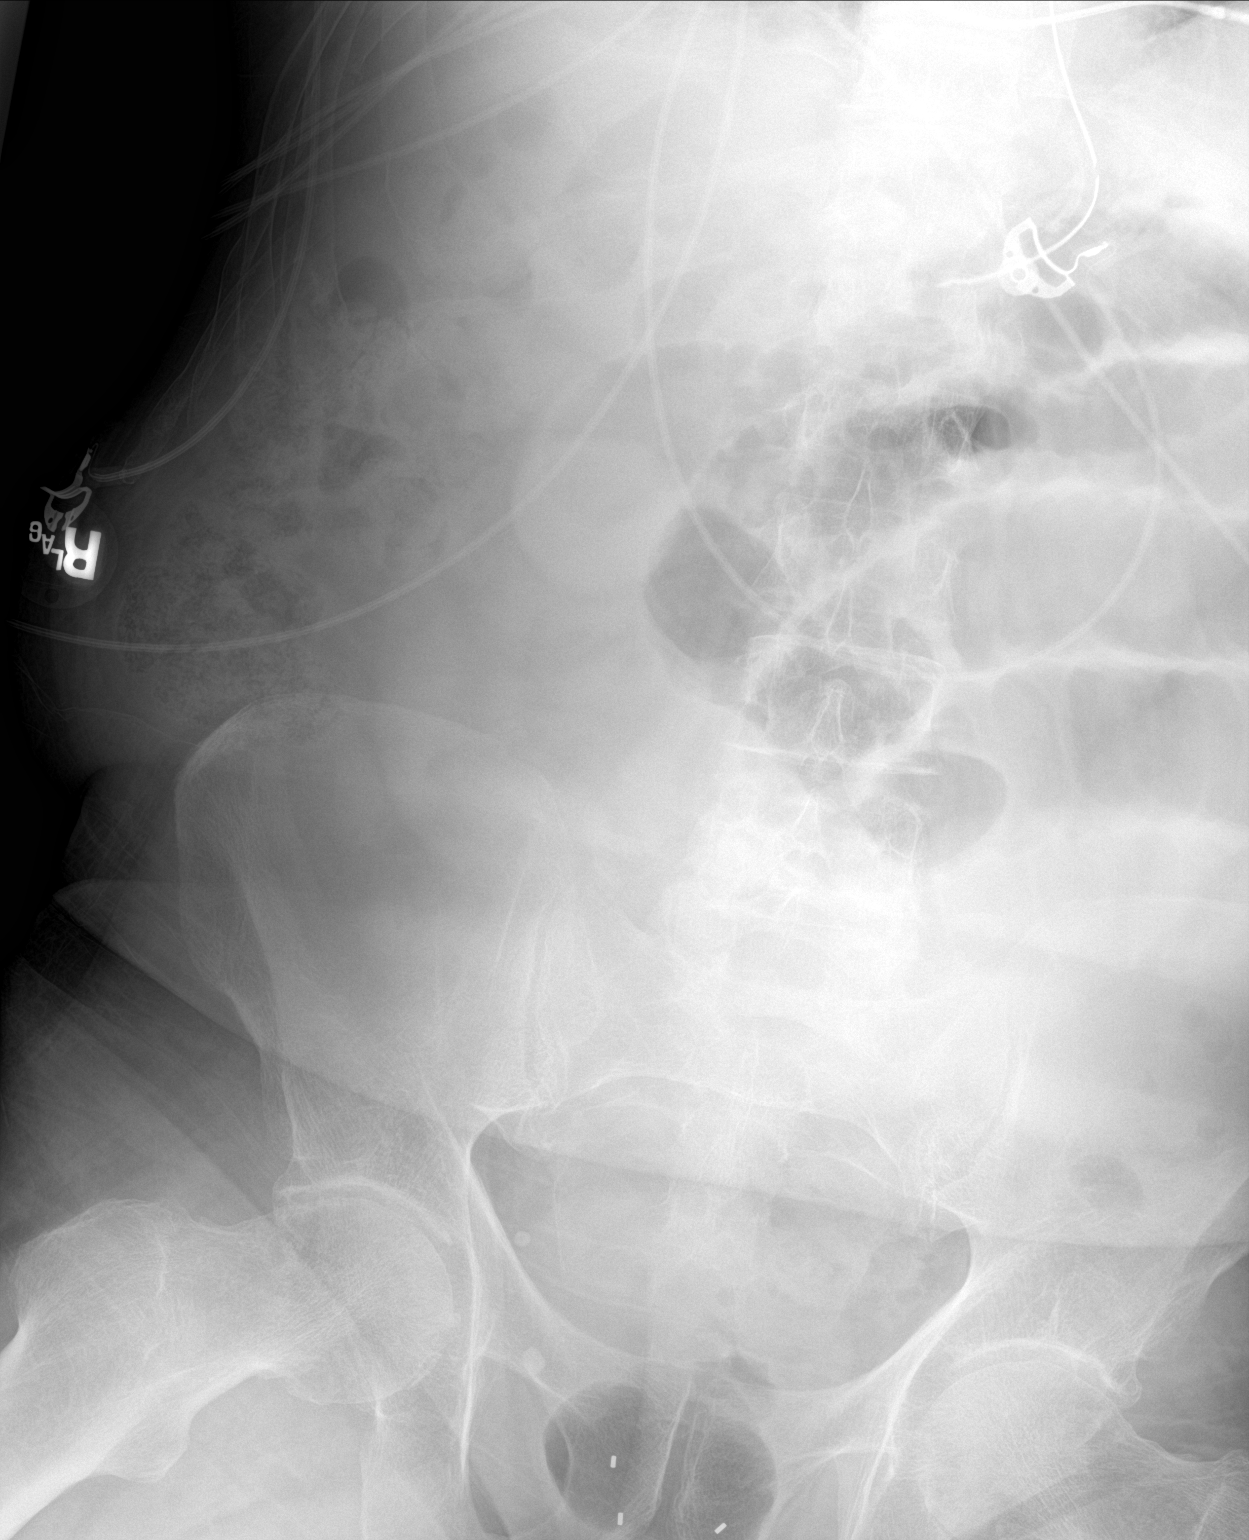
[im 2/2]
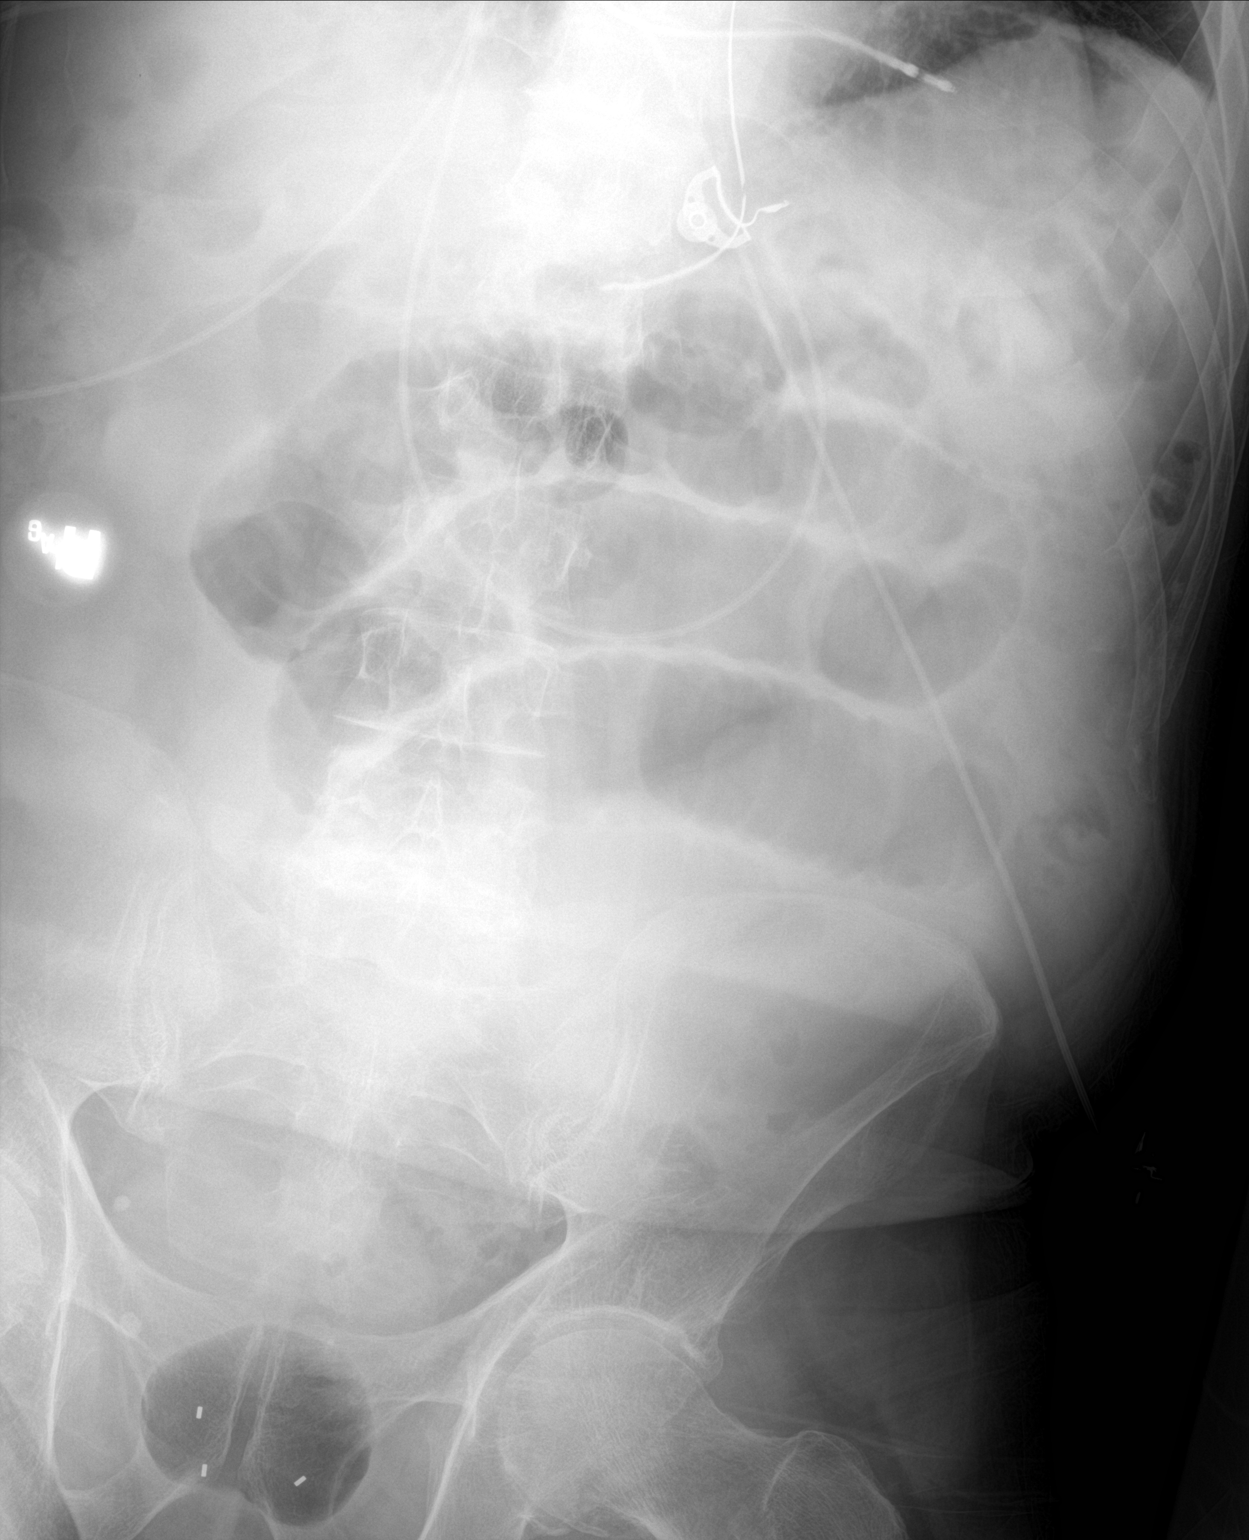

[2 of 2 positions shown; findings below may reference images not displayed]

FINDINGS: Nasogastric tube tip is seen just beyond the expected
gastroesophageal junction within the expected proximal body of the
stomach. Multiple gas-filled dilated loops of small bowel are seen
within the mid abdomen in keeping with a mid to distal small bowel
obstruction. The caliber of dilated small bowel loops appears
slightly progressive since prior examination. Relatively little gas
and stool is seen throughout the large bowel, similar to prior
examination. No gross free intraperitoneal gas.
IMPRESSION: Findings in keeping with a mid to distal small bowel obstruction.
Slight interval progressive distension of multiple loops of
gas-filled small bowel.

## 2020-06-04 SURGERY — LAPAROSCOPY, DIAGNOSTIC
Anesthesia: General | Site: Abdomen

## 2020-06-04 MED ORDER — OXYCODONE HCL 5 MG PO TABS: 5.0000 mg | ORAL_TABLET | Freq: Once | ORAL | Status: DC | PRN

## 2020-06-04 MED ORDER — ROCURONIUM BROMIDE 10 MG/ML (PF) SYRINGE
PREFILLED_SYRINGE | INTRAVENOUS | Status: DC | PRN
  Administered 2020-06-04: 60 mg via INTRAVENOUS

## 2020-06-04 MED ORDER — CEFAZOLIN SODIUM-DEXTROSE 2-4 GM/100ML-% IV SOLN
INTRAVENOUS | Status: AC
  Filled 2020-06-04: qty 100

## 2020-06-04 MED ORDER — 0.9 % SODIUM CHLORIDE (POUR BTL) OPTIME
TOPICAL | Status: DC | PRN
  Administered 2020-06-04: 1000 mL

## 2020-06-04 MED ORDER — DEXAMETHASONE SODIUM PHOSPHATE 10 MG/ML IJ SOLN
INTRAMUSCULAR | Status: AC
  Filled 2020-06-04: qty 1

## 2020-06-04 MED ORDER — PHENYLEPHRINE 40 MCG/ML (10ML) SYRINGE FOR IV PUSH (FOR BLOOD PRESSURE SUPPORT)
PREFILLED_SYRINGE | INTRAVENOUS | Status: AC
  Filled 2020-06-04: qty 10

## 2020-06-04 MED ORDER — LACTATED RINGERS IV SOLN: INTRAVENOUS | Status: DC

## 2020-06-04 MED ORDER — ACETAMINOPHEN 160 MG/5ML PO SOLN: 325.0000 mg | ORAL | Status: DC | PRN

## 2020-06-04 MED ORDER — POTASSIUM CHLORIDE 10 MEQ/100ML IV SOLN
10.0000 meq | INTRAVENOUS | Status: AC
  Administered 2020-06-04 (×2): 10 meq via INTRAVENOUS
  Filled 2020-06-04 (×2): qty 100

## 2020-06-04 MED ORDER — METHOCARBAMOL 1000 MG/10ML IJ SOLN
500.0000 mg | Freq: Four times a day (QID) | INTRAVENOUS | Status: DC | PRN
  Filled 2020-06-04: qty 5

## 2020-06-04 MED ORDER — FENTANYL CITRATE (PF) 100 MCG/2ML IJ SOLN: 25.0000 ug | INTRAMUSCULAR | Status: DC | PRN

## 2020-06-04 MED ORDER — SUGAMMADEX SODIUM 200 MG/2ML IV SOLN
INTRAVENOUS | Status: DC | PRN
  Administered 2020-06-04: 300 mg via INTRAVENOUS

## 2020-06-04 MED ORDER — BUPIVACAINE-EPINEPHRINE 0.25% -1:200000 IJ SOLN
INTRAMUSCULAR | Status: DC | PRN
  Administered 2020-06-04: 30 mL

## 2020-06-04 MED ORDER — PHENYLEPHRINE 40 MCG/ML (10ML) SYRINGE FOR IV PUSH (FOR BLOOD PRESSURE SUPPORT)
PREFILLED_SYRINGE | INTRAVENOUS | Status: DC | PRN
  Administered 2020-06-04: 80 ug via INTRAVENOUS

## 2020-06-04 MED ORDER — FENTANYL CITRATE (PF) 250 MCG/5ML IJ SOLN
INTRAMUSCULAR | Status: DC | PRN
  Administered 2020-06-04 (×3): 50 ug via INTRAVENOUS
  Administered 2020-06-04: 100 ug via INTRAVENOUS

## 2020-06-04 MED ORDER — ONDANSETRON HCL 4 MG/2ML IJ SOLN
INTRAMUSCULAR | Status: AC
  Filled 2020-06-04: qty 2

## 2020-06-04 MED ORDER — LIDOCAINE 2% (20 MG/ML) 5 ML SYRINGE
INTRAMUSCULAR | Status: DC | PRN
  Administered 2020-06-04: 100 mg via INTRAVENOUS

## 2020-06-04 MED ORDER — DEXAMETHASONE SODIUM PHOSPHATE 10 MG/ML IJ SOLN
INTRAMUSCULAR | Status: DC | PRN
  Administered 2020-06-04: 10 mg via INTRAVENOUS

## 2020-06-04 MED ORDER — LIDOCAINE 2% (20 MG/ML) 5 ML SYRINGE
INTRAMUSCULAR | Status: AC
  Filled 2020-06-04: qty 5

## 2020-06-04 MED ORDER — ROCURONIUM BROMIDE 10 MG/ML (PF) SYRINGE
PREFILLED_SYRINGE | INTRAVENOUS | Status: AC
  Filled 2020-06-04: qty 10

## 2020-06-04 MED ORDER — FENTANYL CITRATE (PF) 250 MCG/5ML IJ SOLN
INTRAMUSCULAR | Status: AC
  Filled 2020-06-04: qty 5

## 2020-06-04 MED ORDER — ACETAMINOPHEN 325 MG PO TABS: 325.0000 mg | ORAL_TABLET | ORAL | Status: DC | PRN

## 2020-06-04 MED ORDER — ONDANSETRON HCL 4 MG/2ML IJ SOLN
INTRAMUSCULAR | Status: DC | PRN
  Administered 2020-06-04: 4 mg via INTRAVENOUS

## 2020-06-04 MED ORDER — POTASSIUM PHOSPHATES 15 MMOLE/5ML IV SOLN
20.0000 mmol | Freq: Once | INTRAVENOUS | Status: AC
  Administered 2020-06-04: 20 mmol via INTRAVENOUS
  Filled 2020-06-04: qty 6.67

## 2020-06-04 MED ORDER — OXYCODONE HCL 5 MG/5ML PO SOLN: 5.0000 mg | Freq: Once | ORAL | Status: DC | PRN

## 2020-06-04 MED ORDER — ONDANSETRON HCL 4 MG/2ML IJ SOLN: 4.0000 mg | Freq: Once | INTRAMUSCULAR | Status: DC | PRN

## 2020-06-04 MED ORDER — MEPERIDINE HCL 50 MG/ML IJ SOLN: 6.2500 mg | INTRAMUSCULAR | Status: DC | PRN

## 2020-06-04 MED ORDER — PROPOFOL 10 MG/ML IV BOLUS
INTRAVENOUS | Status: AC
  Filled 2020-06-04: qty 20

## 2020-06-04 MED ORDER — BUPIVACAINE-EPINEPHRINE (PF) 0.25% -1:200000 IJ SOLN
INTRAMUSCULAR | Status: AC
  Filled 2020-06-04: qty 30

## 2020-06-04 MED ORDER — PROPOFOL 10 MG/ML IV BOLUS
INTRAVENOUS | Status: DC | PRN
  Administered 2020-06-04: 150 mg via INTRAVENOUS

## 2020-06-04 SURGICAL SUPPLY — 59 items
ADH SKN CLS APL DERMABOND .7 (GAUZE/BANDAGES/DRESSINGS) ×1
APL PRP STRL LF DISP 70% ISPRP (MISCELLANEOUS) ×1
APPLIER CLIP 5 13 M/L LIGAMAX5 (MISCELLANEOUS)
APPLIER CLIP ROT 10 11.4 M/L (STAPLE)
APR CLP MED LRG 11.4X10 (STAPLE)
APR CLP MED LRG 5 ANG JAW (MISCELLANEOUS)
BLADE EXTENDED COATED 6.5IN (ELECTRODE) IMPLANT
BLADE SURG SZ10 CARB STEEL (BLADE) IMPLANT
CELLS DAT CNTRL 66122 CELL SVR (MISCELLANEOUS) IMPLANT
CHLORAPREP W/TINT 26 (MISCELLANEOUS) ×2 IMPLANT
CLIP APPLIE 5 13 M/L LIGAMAX5 (MISCELLANEOUS) IMPLANT
CLIP APPLIE ROT 10 11.4 M/L (STAPLE) IMPLANT
COVER MAYO STAND STRL (DRAPES) IMPLANT
COVER SURGICAL LIGHT HANDLE (MISCELLANEOUS) ×2 IMPLANT
COVER WAND RF STERILE (DRAPES) IMPLANT
DECANTER SPIKE VIAL GLASS SM (MISCELLANEOUS) ×1 IMPLANT
DERMABOND ADVANCED (GAUZE/BANDAGES/DRESSINGS) ×1
DERMABOND ADVANCED .7 DNX12 (GAUZE/BANDAGES/DRESSINGS) IMPLANT
DRAPE SHEET LG 3/4 BI-LAMINATE (DRAPES) IMPLANT
DRAPE WARM FLUID 44X44 (DRAPES) IMPLANT
ELECT REM PT RETURN 15FT ADLT (MISCELLANEOUS) ×2 IMPLANT
GAUZE SPONGE 4X4 12PLY STRL (GAUZE/BANDAGES/DRESSINGS) ×2 IMPLANT
GLOVE SURG ENC TEXT LTX SZ7.5 (GLOVE) ×2 IMPLANT
GLOVE SURG UNDER LTX SZ8 (GLOVE) ×2 IMPLANT
GOWN STRL REUS W/TWL XL LVL3 (GOWN DISPOSABLE) ×7 IMPLANT
HANDLE SUCTION POOLE (INSTRUMENTS) IMPLANT
IRRIG SUCT STRYKERFLOW 2 WTIP (MISCELLANEOUS)
IRRIGATION SUCT STRKRFLW 2 WTP (MISCELLANEOUS) IMPLANT
KIT BASIN OR (CUSTOM PROCEDURE TRAY) ×2 IMPLANT
KIT TURNOVER KIT A (KITS) ×2 IMPLANT
LEGGING LITHOTOMY PAIR STRL (DRAPES) IMPLANT
RETRACTOR WND ALEXIS 18 MED (MISCELLANEOUS) IMPLANT
RTRCTR WOUND ALEXIS 18CM MED (MISCELLANEOUS)
SCISSORS LAP 5X35 DISP (ENDOMECHANICALS) ×2 IMPLANT
SHEARS HARMONIC ACE PLUS 36CM (ENDOMECHANICALS) IMPLANT
SLEEVE XCEL OPT CAN 5 100 (ENDOMECHANICALS) ×2 IMPLANT
SPONGE LAP 18X18 RF (DISPOSABLE) ×1 IMPLANT
STAPLER VISISTAT 35W (STAPLE) IMPLANT
STRIP CLOSURE SKIN 1/2X4 (GAUZE/BANDAGES/DRESSINGS) IMPLANT
SUCTION POOLE HANDLE (INSTRUMENTS)
SUT PDS AB 1 TP1 96 (SUTURE) IMPLANT
SUT PROLENE 2 0 KS (SUTURE) IMPLANT
SUT PROLENE 2 0 SH DA (SUTURE) IMPLANT
SUT SILK 2 0 (SUTURE)
SUT SILK 2 0 SH CR/8 (SUTURE) IMPLANT
SUT SILK 2-0 18XBRD TIE 12 (SUTURE) IMPLANT
SUT SILK 3 0 (SUTURE)
SUT SILK 3 0 SH CR/8 (SUTURE) IMPLANT
SUT SILK 3-0 18XBRD TIE 12 (SUTURE) IMPLANT
SYR BULB IRRIG 60ML STRL (SYRINGE) IMPLANT
SYS LAPSCP GELPORT 120MM (MISCELLANEOUS)
SYSTEM LAPSCP GELPORT 120MM (MISCELLANEOUS) IMPLANT
TOWEL OR 17X26 10 PK STRL BLUE (TOWEL DISPOSABLE) ×2 IMPLANT
TOWEL OR NON WOVEN STRL DISP B (DISPOSABLE) ×1 IMPLANT
TRAY FOLEY MTR SLVR 16FR STAT (SET/KITS/TRAYS/PACK) ×2 IMPLANT
TRAY LAPAROSCOPIC (CUSTOM PROCEDURE TRAY) ×2 IMPLANT
TROCAR BLADELESS OPT 5 100 (ENDOMECHANICALS) ×3 IMPLANT
TROCAR XCEL NON-BLD 11X100MML (ENDOMECHANICALS) IMPLANT
YANKAUER SUCT BULB TIP NO VENT (SUCTIONS) IMPLANT

## 2020-06-04 NOTE — Progress Notes (Signed)
Pharmacy Antibiotic Note  Todd Chapman is a 75 y.o. male admitted on 06/01/2020.  Pharmacy has been consulted for Unasyn dosing for aspiration pneumonia.  Day #4 therapy - Tm 99.7 - WBC wnl - SCr improved, CrCl ~80 ml/min  Plan: Continue Unasyn 3g IV q6h Planning 5 days per MD   Height: 6\' 2"  (188 cm) Weight: 92.3 kg (203 lb 7.8 oz) IBW/kg (Calculated) : 82.2  Temp (24hrs), Avg:99.1 F (37.3 C), Min:98.7 F (37.1 C), Max:99.7 F (37.6 C)  Recent Labs  Lab 06/01/20 1307 06/02/20 0346 06/03/20 0351 06/04/20 0412  WBC 3.5* 9.6 7.8 9.4  CREATININE 1.43* 1.57* 1.23 0.87    Estimated Creatinine Clearance: 82.7 mL/min (by C-G formula based on SCr of 0.87 mg/dL).    Allergies  Allergen Reactions  . Atorvastatin Palpitations  . Ezetimibe Palpitations    Antimicrobials this admission: 3/26 Unasyn >>   Dose adjustments this admission:  Microbiology results: 3/26 BCx: ngtd 3/28 Urine strep Ag: neg 3/28 Urine legionella Ag: IP  Thank you for allowing pharmacy to be a part of this patient's care.  Peggyann Juba, PharmD, BCPS [facility] main pharmacy [phone] 06/04/2020 8:52 AM

## 2020-06-04 NOTE — Anesthesia Procedure Notes (Signed)
Procedure Name: Intubation Date/Time: 06/04/2020 2:36 PM Performed by: Talbot Grumbling, CRNA Pre-anesthesia Checklist: Patient identified, Emergency Drugs available, Suction available and Patient being monitored Patient Re-evaluated:Patient Re-evaluated prior to induction Oxygen Delivery Method: Circle system utilized Preoxygenation: Pre-oxygenation with 100% oxygen Induction Type: IV induction and Cricoid Pressure applied Ventilation: Mask ventilation without difficulty Laryngoscope Size: 3 and Mac Grade View: Grade I Tube type: Oral Tube size: 7.0 mm Number of attempts: 1 Airway Equipment and Method: Stylet Placement Confirmation: ETT inserted through vocal cords under direct vision,  positive ETCO2 and breath sounds checked- equal and bilateral Secured at: 22 cm Tube secured with: Tape Dental Injury: Teeth and Oropharynx as per pre-operative assessment

## 2020-06-04 NOTE — Progress Notes (Signed)
[facility] Progress Note     Subjective: CC-  Up in chair, family at bedside. States that he did have some worsening abdominal pain and nausea last night, and feels a little distended this morning. Pain is more in the right abdomen. Passing little to no flatus, no BM. NG tube with 710cc/24 hr.  Objective: Vital signs in last 24 hours: Temp:  [98.7 F (37.1 C)-99.7 F (37.6 C)] 99 F (37.2 C) (03/29 0612) Pulse Rate:  [76-84] 83 (03/29 0612) Resp:  [18] 18 (03/29 0612) BP: (141-147)/(86-96) 142/96 (03/29 0612) SpO2:  [96 %] 96 % (03/29 0612) Last BM Date: 05/27/20  Intake/Output from previous day: 03/28 0701 - 03/29 0700 In: 1840.1 [P.O.:30; I.V.:1475.6; IV Piggyback:334.5] Out: 1235 [Urine:525; Emesis/NG output:710] Intake/Output this shift: No intake/output data recorded.  PE: Gen:  Alert, NAD, pleasant Pulm:  rate and effort normal on room air Abd: Soft, mild distension, mild right sided abdominal TTP, few BS heard Skin: no rashes noted, warm and dry  Lab Results:  Recent Labs    06/03/20 0351 06/04/20 0412  WBC 7.8 9.4  HGB 10.6* 11.7*  HCT 32.6* 36.6*  PLT 132* 145*   BMET Recent Labs    06/03/20 0351 06/04/20 0412  NA 139 145  K 3.5 3.6  CL 101 106  CO2 31 27  GLUCOSE 119* 113*  BUN 33* 30*  CREATININE 1.23 0.87  CALCIUM 10.1 10.5*   PT/INR No results for input(s): LABPROT, INR in the last 72 hours. CMP     Component Value Date/Time   NA 145 06/04/2020 0412   NA 141 02/08/2017 1417   K 3.6 06/04/2020 0412   CL 106 06/04/2020 0412   CO2 27 06/04/2020 0412   GLUCOSE 113 (H) 06/04/2020 0412   BUN 30 (H) 06/04/2020 0412   BUN 14 02/08/2017 1417   CREATININE 0.87 06/04/2020 0412   CALCIUM 10.5 (H) 06/04/2020 0412   PROT 6.7 06/04/2020 0412   ALBUMIN 3.6 06/04/2020 0412   AST 25 06/04/2020 0412   ALT 19 06/04/2020 0412   ALKPHOS 60 06/04/2020 0412   BILITOT 2.6 (H) 06/04/2020 0412   GFRNONAA >60 06/04/2020 0412   GFRAA >60  02/17/2017 0517   Lipase     Component Value Date/Time   LIPASE 21 06/01/2020 1307       Studies/Results: DG Abd 1 View  Result Date: 06/04/2020 CLINICAL DATA:  Small-bowel obstruction EXAM: ABDOMEN - 1 VIEW COMPARISON:  06/03/2020 FINDINGS: Nasogastric tube tip is seen just beyond the expected gastroesophageal junction within the expected proximal body of the stomach. Multiple gas-filled dilated loops of small bowel are seen within the mid abdomen in keeping with a mid to distal small bowel obstruction. The caliber of dilated small bowel loops appears slightly progressive since prior examination. Relatively little gas and stool is seen throughout the large bowel, similar to prior examination. No gross free intraperitoneal gas. IMPRESSION: Findings in keeping with a mid to distal small bowel obstruction. Slight interval progressive distension of multiple loops of gas-filled small bowel. Electronically Signed   By: Fidela Salisbury MD   On: 06/04/2020 05:34   DG CHEST PORT 1 VIEW  Result Date: 06/04/2020 CLINICAL DATA:  Small-bowel obstruction, dyspnea EXAM: PORTABLE CHEST 1 VIEW COMPARISON:  06/01/2020 FINDINGS: Nasogastric tube extends into the upper abdomen beyond the margin of the examination. Lung volumes are small, but are symmetric. Pulmonary insufflation has decreased slightly since prior examination. There is increasing multifocal pulmonary infiltrate throughout the  right lung, likely infectious in the acute setting. Minimal left basilar atelectasis has now developed. No pneumothorax or pleural effusion. Cardiac size is mildly enlarged, unchanged. Left subclavian dual lead pacemaker is unchanged. Pulmonary vascularity is normal. IMPRESSION: Progressive pulmonary hypoinflation. Progressive multifocal pulmonary infiltrate within the right lung, likely infectious. Electronically Signed   By: Fidela Salisbury MD   On: 06/04/2020 05:32   DG Abd Portable 1V  Result Date: 06/03/2020 CLINICAL DATA:   Small-bowel obstruction EXAM: PORTABLE ABDOMEN - 1 VIEW COMPARISON:  06/02/2020 FINDINGS: Nasogastric tube tip overlies the expected mid body of the stomach. Multiple gas-filled mildly dilated loops of small bowel are seen within the mid abdomen, improved in caliber since prior examination. There is gas and stool seen throughout the colon, unchanged from prior examination. The findings are, together, in keeping with changes of a resolving small bowel obstruction. No gross free intraperitoneal gas. Brachytherapy seeds noted within the prostate gland. IMPRESSION: Nasogastric tube in expected position. Decreasing caliber of dilated loops of small bowel in keeping with resolving small bowel obstruction. Electronically Signed   By: Fidela Salisbury MD   On: 06/03/2020 05:55    Anti-infectives: Anti-infectives (From admission, onward)   Start     Dose/Rate Route Frequency Ordered Stop   06/01/20 1930  Ampicillin-Sulbactam (UNASYN) 3 g in sodium chloride 0.9 % 100 mL IVPB        3 g 200 mL/hr over 30 Minutes Intravenous Every 6 hours 06/01/20 1842         Assessment/Plan Hx prostate cancer with radiation Rx 12 days ago AKI/dehydration - improved, Cr 0.87 Persistent Atrial fibrillation  Bradycardia s/p PPM Leukopenia Elevated LFT - Tbili 2.6 Hypercalcemia/hyperphosphatemia - resolved Thrombocytopenia   - platelets 191>>154>>132>>145 Right lower lobe Pneumonia - on unasyn. 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 Plan for surgery today: diagnostic laparoscopy, possible laparotomy, possible bowel resection.   LOS: 3 days    [facility] 06/04/2020, 8:12 AM Please see Amion for pager number during day hours 7:00am-4:30pm

## 2020-06-04 NOTE — Progress Notes (Signed)
PROGRESS NOTE    SOULEYMANE SAIKI  WPY:[id number] DOB: 11-27-1945 DOA: 06/01/2020 PCP: [name], MD   Brief Narrative:  Todd Chapman is a 75 y.o. 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  Patient states that since January he is bowels have not been right and recently seen in the ED for ileitis and was placed on levofloxacin and metronidazole at that time.  He has been having chronic diarrhea however last night he was unable to defecate and started becoming extremely nauseous today vomiting.  Wife states that he started vomiting again this morning it was projectile vomiting.  Because of symptoms he was brought to the emergency room and was found to have a high-grade small bowel obstruction as well as an aspiration pneumonia.  He still describes diffuse abdominal pain but states is better with the pain regimen.  He complains of some congestion and some mild shortness of breath especially when he walks.  Denies any other concerns or points at this time.  TRH was asked to admit this patient for the above symptoms  ED Course: In the ED patient had basic blood work done and had a CT of the abdomen pelvis.  He had NG tube placed and was transferred to [facility] Covid testing was negative. General surgery was consulted  **Interim History  General Surgery was consulted and he underwent a small bowel protocol and repeat KUB shows multiple dilated loops of small bowel in the central abdomen compatible with persistent small bowel obstruction.  Patient is clinically not improving today so we will give him a more bowel rest and continue with IV fluid  hydration and NG tube decompression and suctioning.   Patient's KUB on 06/03/20 showed improvement and surgery recommending continue NG drainage and decompression.  His urine was a little pink this morning so we will obtain a urinalysis.  He had some minimal flatus yesterday.  General surgery is adjusting his pain regimen and change it to 1 to 2 mg every 2 hours as needed for severe pain.  They will allow the patient a cup of ice.  He has been ambulating today in the hallways.  Subsequently on 06/04/2020 patient became more distended and had worsening abdominal pain the night before nausea.  Pain was more on the right side of the abdomen and he is passing little to no flatus and had no bowel movement.  NG tube continues to put out significant amount had 710 mL last 24 hours.  Because of his persistent SBO not responding to conservative management patient will be going for surgical intervention and as well as a diagnostic laparoscopic, possible laparotomy and possible bowel resection today.  Assessment & Plan:   Active Problems:   SBO (small bowel obstruction) (HCC)  High-Grade SBO, poA -Has a Hx of appendectomy but recently has been getting radiation treatment for prostate cancer -CT Scan shows abrupt Transition point in the RLQ -NG Placed and DG Abdomen done and showed "NG tube with tip overlying the proximal stomach and side hole at the esophagogastric junction-recommend advancement.";  [facility] and will be obtaining repeat DG Abdomen -Continues have significant output from his NG tube -NPO  and Supportive Care -C/w Antiementics -Ensure Mag >2.0 and K+ >4.0; K+ was 3.6 today and Mag Level was 2.1 -We will replete with IV KCl 40 mEq x 1 and IV K Phos 20 mmol  -Bolused 1 Liter; C/w Maintenance IVF with NS at 75 mL/hr -Pain Control with IV Morphine 2 mg q2hprn but have adjusted this to 1 to 2 mg every 2 hours as needed -Antiemetics with IV  Ondansetron -Acetaminophen 650 po/RC -Will consult General Surgery for further evaluation and Recc's  -Repeat KUB on 06/04/2020 showed "Findings in keeping with a mid to distal small bowel obstruction. Slight interval progressive distension of multiple loops of gas-filled small bowel." -Surgery is initiating small bowel protocol currently; Repeat KUB shows persistent small bowel obstruction and since he failed to improve with conservative therapy patient will be undergoing surgical intervention today -Hold off on all p.o. medications and hold off his anticoagulation -Continue with mobilization and ambulation  RLL PNA, poA likley in the setting of Aspiration from Nausea and Vomiting Acute respiratory failure with hypoxia in the setting of Aspiration Pneumonia -Patient has been nauseous and vomiting severely with projectile vomiting -Likely aspirated -Aspiration precautions -Start Xopenex and Atrovent every 6 scheduled and also order flutter valve, incentive spirometry -Check Strep and Legionella urine antigens; SARS COV-2 Negative and Strep Resulted Negative today  -Empirically start antibiotics with IV Unasyn for 5 days -Blood Cx x2 showed no growth to date at 2 Days  -Continue wearing 2 L of supplemental oxygen -SpO2: 96 % O2 Flow Rate (L/min): 2 L/min FiO2 (%): 28 %; was weaned off of oxygen  -Continue to Monitor Respiratory Status Carefully -Repeat chest x-ray this AM showed "Progressive pulmonary hypoinflation. Progressive multifocal pulmonary infiltrate within the right lung, likely infectious." -Continue to Monitor Respiratory Status Carefully  Prostate Cancer -Holding Tamsulosin as he is NPO and resume when tolerating Diet -Recently completed Radiation Tx 13 Days ago -Will need to Follow up with Radiation Onc and Urology as an outpatient   AKI, initially worsened but now improving Hyperphosphatemia --> Hypophosphatemia -The setting of dehydration as patient's BUNs/creatinine  is 24/1.43 -> 36/1.57 -> 33/1.23 -> 30/0.87 -Phos worsened in the setting of AKI and Phos Level was 4.7 -> 2.5 -> 1.8; Replete with IV K Phos 20 mmol -IVF Hydration as above with normal saline at 75 MLS per hour -Avoid Nephrotoxic Medications, Contrast Dyes, Hypotension and Renally Adjust Medications -Continue to Monitor and Trend Renal Fxn Carefully  -Repeat CMP in the AM   Hyperbilirubinemia -Patient's T Bili is 2.0 on admission and worsened to 2.3 -> 2.2 -> 2.6 -IVF Hydration as above -Will consider obtaining a RUQ U/S if not improving significantly -Continue to Monitor and Trend -Repeat CMP in the AM  Persistent Atrial Fibrillation Bradycardia s/p PPM  -Continue to Monitor on Telemetry  -Holding Anticoagulation as above  Normocytic Anemia -Patient's Hgb/Hct went from 14.5/43.5 -> 11.8/34.9 -> 10.6/32.6 -> 11.7/36.6 -Checked Anemia Panel and was showing an iron level of 9, U IBC of 222, TIBC of 231, saturation ratios of 4%, ferritin level 216, folate of 9.3, vitamin B12 level of 235 -Continue to Monitor for S/Sx of Bleeding; No overt bleeding noted -Anticoagulation has been held -Repeat CBC in the AM    Leukopenia -Improved and WBC went from 3.5 -> 9.6 -> 7.8 -> 9.4 -On IV Unasyn as above -Continue to Monitor and Trend -Repeat WBC in the AM  Hypercalcemia -In the setting of Dehydration form Nausea and Vomiting -Improved with IVF -Ca2+  went from 11.1 -> 9.8 -> 10.1 -> 10.4 -Repeat CMP in the AM   PreDiabetes -Patient's HgbA1c was 5.9 -Blood Sugars ranging from 121-131 on daily BMP/CMP; CBGs ranging from 107-112 -Continue to Monitor Blood Sugars carefully and if necessary will place on Sensitive Novolog SSI AC  GERD -Change po PPI to IV Pantoprazole 40 mg po q24h  HLD -Hold Statin while NPO and resume when able to tolerate  Pink urine -Obtained a urinalysis and showed a small amount of hemoglobin, no bacteria seen, 0-5 squamous epithelial cells, 0-5 RBCs per  high-power field, 0-5 WBCs, negative ketones, negative leukocytes;  -His anticoagulation has been held and he is only on enoxaparin 40 mg subcu every 24 -Continue to monitor and observe closely and likely will need outpatient follow-up with urology  DVT prophylaxis: SCDs Code Status: FULL CODE Family Communication: No family present at bedside Disposition Plan: Pending further clinical improvement and clearance by General Surgery  Status is: Inpatient  Remains inpatient appropriate because:Unsafe d/c plan, IV treatments appropriate due to intensity of illness or inability to take PO and Inpatient level of care appropriate due to severity of illness   Dispo: The patient is from: Home              Anticipated d/c is to: Home              Patient currently is not medically stable to d/c.   Difficult to place patient No   Consultants:   General Surgery   Procedures: None  Antimicrobials:  Anti-infectives (From admission, onward)   Start     Dose/Rate Route Frequency Ordered Stop   06/01/20 1930  Ampicillin-Sulbactam (UNASYN) 3 g in sodium chloride 0.9 % 100 mL IVPB        3 g 200 mL/hr over 30 Minutes Intravenous Every 6 hours 06/01/20 1842          Subjective: Seen and examined at bedside and has been passing minimal flatus and states his abdominal distention and pain is worsened.  Understand he will be going for surgical intervention today.  No nausea or vomiting now resting.  Denies any respiratory complaints.  No chest pain.  No other concerns or complaints this time.  Objective: Vitals:   06/03/20 2015 06/03/20 2051 06/04/20 0612 06/04/20 0812  BP:  (!) 147/92 (!) 142/96   Pulse:  84 83   Resp:  18 18   Temp:  98.7 F (37.1 C) 99 F (37.2 C)   TempSrc:  Oral Oral   SpO2: 96% 96% 96% 96%  Weight:      Height:        Intake/Output Summary (Last 24 hours) at 06/04/2020 0944 Last data filed at 06/04/2020 0530 Gross per 24 hour  Intake 1840.14 ml  Output 1235 ml   Net 605.14 ml   Filed Weights   06/01/20 1256 06/03/20 0500  Weight: 93 kg 92.3 kg   Examination: Physical Exam:  Constitutional: WN/WD overweight Caucasian male currently resting in the bed appears calm.  NG tube in place to suction Eyes: Lids and conjunctivae normal, sclerae anicteric  ENMT: External Ears, Nose appear normal. Grossly normal hearing. .  Neck: Appears normal, supple, no cervical masses, normal ROM, no appreciable thyromegaly; no JVD Respiratory: Diminished to auscultation bilaterally with coarse breath sounds worse on the right compared to the left, no wheezing, rales, rhonchi or crackles. Normal respiratory effort and patient is not tachypenic. No accessory muscle use.  He has unlabored breathing and  not wearing supplemental oxygen via nasal cannula Cardiovascular: RRR, no murmurs / rubs / gallops. S1 and S2 auscultated.  Trace pedal edema Abdomen: Soft, tender to palpate, distended secondary body habitus. Bowel sounds positive but are hypoactive.  GU: Deferred. Musculoskeletal: No clubbing / cyanosis of digits/nails. No joint deformity upper and lower extremities.  Skin: No rashes, lesions, ulcers on limited skin evaluation. No induration; Warm and dry.  Neurologic: CN 2-12 grossly intact with no focal deficits. Romberg sign and cerebellar reflexes not assessed.  Psychiatric: Normal judgment and insight. Alert and oriented x 3. Normal mood and appropriate affect.   Data Reviewed: I have personally reviewed following labs and imaging studies  CBC: Recent Labs  Lab 06/01/20 1307 06/02/20 0346 06/03/20 0351 06/04/20 0412  WBC 3.5* 9.6 7.8 9.4  NEUTROABS 2.8  --  6.9 8.3*  HGB 14.5 11.8* 10.6* 11.7*  HCT 43.5 34.9* 32.6* 36.6*  MCV 92.9 94.1 97.3 98.4  PLT 191 154 132* 518*   Basic Metabolic Panel: Recent Labs  Lab 06/01/20 1307 06/02/20 0346 06/03/20 0351 06/04/20 0412  NA 135 137 139 145  K 3.5 3.9 3.5 3.6  CL 91* 96* 101 106  CO2 30 29 31 27    GLUCOSE 131* 121* 119* 113*  BUN 24* 36* 33* 30*  CREATININE 1.43* 1.57* 1.23 0.87  CALCIUM 11.1* 9.8 10.1 10.5*  MG  --  2.3 2.4 2.1  PHOS  --  4.7* 2.5 1.8*   GFR: Estimated Creatinine Clearance: 82.7 mL/min (by C-G formula based on SCr of 0.87 mg/dL). Liver Function Tests: Recent Labs  Lab 06/01/20 1307 06/02/20 0346 06/03/20 0351 06/04/20 0412  AST 29 33 25 25  ALT 21 22 19 19   ALKPHOS 70 58 53 60  BILITOT 2.0* 2.3* 2.2* 2.6*  PROT 6.7 6.1* 5.9* 6.7  ALBUMIN 4.4 3.4* 3.2* 3.6   Recent Labs  Lab 06/01/20 1307  LIPASE 21   No results for input(s): AMMONIA in the last 168 hours. Coagulation Profile: No results for input(s): INR, PROTIME in the last 168 hours. Cardiac Enzymes: No results for input(s): CKTOTAL, CKMB, CKMBINDEX, TROPONINI in the last 168 hours. BNP (last 3 results) No results for input(s): PROBNP in the last 8760 hours. HbA1C: Recent Labs    06/02/20 0346  HGBA1C 5.9*   CBG: Recent Labs  Lab 06/02/20 0413 06/02/20 0751 06/02/20 1132 06/03/20 0832 06/04/20 0749  GLUCAP 132* 120* 120* 112* 107*   Lipid Profile: No results for input(s): CHOL, HDL, LDLCALC, TRIG, CHOLHDL, LDLDIRECT in the last 72 hours. Thyroid Function Tests: Recent Labs    06/02/20 0346  TSH 2.044   Anemia Panel: Recent Labs    06/03/20 0351  VITAMINB12 235  FOLATE 9.3  FERRITIN 216  TIBC 231*  IRON 9*  RETICCTPCT 1.1   Sepsis Labs: No results for input(s): PROCALCITON, LATICACIDVEN in the last 168 hours.  Recent Results (from the past 240 hour(s))  Resp Panel by RT-PCR (Flu A&B, Covid) Nasopharyngeal Swab     Status: None   Collection Time: 06/01/20  3:38 PM   Specimen: Nasopharyngeal Swab; Nasopharyngeal(NP) swabs in vial transport medium  Result Value Ref Range Status   SARS Coronavirus 2 by RT PCR NEGATIVE NEGATIVE Final    Comment: (NOTE) SARS-CoV-2 target nucleic acids are NOT DETECTED.  The SARS-CoV-2 RNA is generally detectable in upper  respiratory specimens during the acute phase of infection. The lowest concentration of SARS-CoV-2 viral copies this assay can detect is 138 copies/mL. A negative  result does not preclude SARS-Cov-2 infection and should not be used as the sole basis for treatment or other patient management decisions. A negative result may occur with  improper specimen collection/handling, submission of specimen other than nasopharyngeal swab, presence of viral mutation(s) within the areas targeted by this assay, and inadequate number of viral copies(<138 copies/mL). A negative result must be combined with clinical observations, patient history, and epidemiological information. The expected result is Negative.  Fact Sheet for Patients:  EntrepreneurPulse.com.au  Fact Sheet for Healthcare Providers:  IncredibleEmployment.be  This test is no t yet approved or cleared by the [place] FDA and  has been authorized for detection and/or diagnosis of SARS-CoV-2 by FDA under an Emergency Use Authorization (EUA). This EUA will remain  in effect (meaning this test can be used) for the duration of the COVID-19 declaration under Section 564(b)(1) of the Act, 21 U.S.C.section 360bbb-3(b)(1), unless the authorization is terminated  or revoked sooner.       Influenza A by PCR NEGATIVE NEGATIVE Final   Influenza B by PCR NEGATIVE NEGATIVE Final    Comment: (NOTE) The Xpert Xpress SARS-CoV-2/FLU/RSV plus assay is intended as an aid in the diagnosis of influenza from Nasopharyngeal swab specimens and should not be used as a sole basis for treatment. Nasal washings and aspirates are unacceptable for Xpert Xpress SARS-CoV-2/FLU/RSV testing.  Fact Sheet for Patients: EntrepreneurPulse.com.au  Fact Sheet for Healthcare Providers: IncredibleEmployment.be  This test is not yet approved or cleared by the [place] FDA and has been  authorized for detection and/or diagnosis of SARS-CoV-2 by FDA under an Emergency Use Authorization (EUA). This EUA will remain in effect (meaning this test can be used) for the duration of the COVID-19 declaration under Section 564(b)(1) of the Act, 21 U.S.C. section 360bbb-3(b)(1), unless the authorization is terminated or revoked.  Performed at [facility]   Culture, blood (routine x 2) Call MD if unable to obtain prior to antibiotics being given     Status: None (Preliminary result)   Collection Time: 06/01/20  6:50 PM   Specimen: BLOOD LEFT WRIST  Result Value Ref Range Status   Specimen Description   Final    BLOOD LEFT WRIST Performed at [facility], [address], [place], [place] [address]    Special Requests   Final    BOTTLES DRAWN AEROBIC AND ANAEROBIC Blood Culture adequate volume Performed at [facility] [address], [place], [place] [address]    Culture   Final    NO GROWTH < 24 HOURS Performed at [facility] [address], [place], [place] [address]    Report Status PENDING  Incomplete  Culture, blood (routine x 2) Call MD if unable to obtain prior to antibiotics being given     Status: None (Preliminary result)   Collection Time: 06/01/20  6:56 PM   Specimen: BLOOD  Result Value Ref Range Status   Specimen Description   Final    BLOOD RIGHT ANTECUBITAL Performed at [facility] [address] [place], [place] [address]    Special Requests   Final    BOTTLES DRAWN AEROBIC ONLY Blood Culture adequate volume Performed at [facility] [address] [place], [place] [address]    Culture   Final    NO GROWTH < 24 HOURS Performed at [facility] [address] [place], [place] [address]    Report Status PENDING  Incomplete  RN Pressure Injury Documentation:   Estimated body mass index is 26.13 kg/m as calculated from the following:   Height as of  this encounter: 6\' 2"  (1.88 m).   Weight as of this encounter: 92.3 kg.  Malnutrition Type: Nutrition Problem: Inadequate oral intake Etiology: inability to eat Malnutrition Characteristics: Signs/Symptoms: NPO status Nutrition Interventions Interventions: Refer to RD note for recommendations  Radiology Studies: DG Abd 1 View  Result Date: 06/04/2020 CLINICAL DATA:  Small-bowel obstruction EXAM: ABDOMEN - 1 VIEW COMPARISON:  06/03/2020 FINDINGS: Nasogastric tube tip is seen just beyond the expected gastroesophageal junction within the expected proximal body of the stomach. Multiple gas-filled dilated loops of small bowel are seen within the mid abdomen in keeping with a mid to distal small bowel obstruction. The caliber of dilated small bowel loops appears slightly progressive since prior examination. Relatively little gas and stool is seen throughout the large bowel, similar to prior examination. No gross free intraperitoneal gas. IMPRESSION: Findings in keeping with a mid to distal small bowel obstruction. Slight interval progressive distension of multiple loops of gas-filled small bowel. Electronically Signed   By: Fidela Salisbury MD   On: 06/04/2020 05:34   DG CHEST PORT 1 VIEW  Result Date: 06/04/2020 CLINICAL DATA:  Small-bowel obstruction, dyspnea EXAM: PORTABLE CHEST 1 VIEW COMPARISON:  06/01/2020 FINDINGS: Nasogastric tube extends into the upper abdomen beyond the margin of the examination. Lung volumes are small, but are symmetric. Pulmonary insufflation has decreased slightly since prior examination. There is increasing multifocal pulmonary infiltrate throughout the right lung, likely infectious in the acute setting. Minimal left basilar atelectasis has now developed. No pneumothorax or pleural effusion. Cardiac size is mildly enlarged, unchanged. Left subclavian dual lead pacemaker is unchanged. Pulmonary vascularity is normal. IMPRESSION: Progressive pulmonary hypoinflation. Progressive  multifocal pulmonary infiltrate within the right lung, likely infectious. Electronically Signed   By: Fidela Salisbury MD   On: 06/04/2020 05:32   DG Abd Portable 1V  Result Date: 06/03/2020 CLINICAL DATA:  Small-bowel obstruction EXAM: PORTABLE ABDOMEN - 1 VIEW COMPARISON:  06/02/2020 FINDINGS: Nasogastric tube tip overlies the expected mid body of the stomach. Multiple gas-filled mildly dilated loops of small bowel are seen within the mid abdomen, improved in caliber since prior examination. There is gas and stool seen throughout the colon, unchanged from prior examination. The findings are, together, in keeping with changes of a resolving small bowel obstruction. No gross free intraperitoneal gas. Brachytherapy seeds noted within the prostate gland. IMPRESSION: Nasogastric tube in expected position. Decreasing caliber of dilated loops of small bowel in keeping with resolving small bowel obstruction. Electronically Signed   By: Fidela Salisbury MD   On: 06/03/2020 05:55   Scheduled Meds: . enoxaparin (LOVENOX) injection  40 mg Subcutaneous Q24H  . ipratropium  0.5 mg Nebulization TID  . levalbuterol  0.63 mg Nebulization TID  . mouth rinse  15 mL Mouth Rinse BID  . pantoprazole (PROTONIX) IV  40 mg Intravenous QHS   Continuous Infusions: . sodium chloride 75 mL/hr at 06/04/20 0429  . ampicillin-sulbactam (UNASYN) IV 3 g (06/04/20 0606)    LOS: 3 days   Kerney Elbe, DO [facility] PAGER is on AMION  If 7PM-7AM, please contact night-coverage [email or web address]

## 2020-06-04 NOTE — Plan of Care (Signed)

## 2020-06-04 NOTE — Transfer of Care (Signed)
Immediate Anesthesia Transfer of Care Note  Patient: Todd Chapman  Procedure(s) Performed: POSSIBLE LAPAROTOMY POSSIBLE BOWEL RESSECTION (N/A Abdomen)  Patient Location: PACU  Anesthesia Type:General  Level of Consciousness: awake, alert  and oriented  Airway & Oxygen Therapy: Patient Spontanous Breathing and Patient connected to face mask oxygen  Post-op Assessment: Report given to RN and Post -op Vital signs reviewed and stable  Post vital signs: Reviewed and stable  Last Vitals:  Vitals Value Taken Time  BP 136/88 06/04/20 1530  Temp    Pulse    Resp    SpO2    Vitals shown include unvalidated device data.  Last Pain:  Vitals:   06/04/20 1340  TempSrc: Oral  PainSc: 0-No pain      Patients Stated Pain Goal: 0 (32/91/91 6606)  Complications: No complications documented.

## 2020-06-04 NOTE — Anesthesia Preprocedure Evaluation (Signed)
Anesthesia Evaluation  Patient identified by MRN, date of birth, ID band Patient awake    Reviewed: Allergy & Precautions, NPO status , Patient's Chart, lab work & pertinent test results  History of Anesthesia Complications Negative for: history of anesthetic complications  Airway Mallampati: I  TM Distance: >3 FB Neck ROM: Full    Dental no notable dental hx. (+) Teeth Intact   Pulmonary neg pulmonary ROS, former smoker,    Pulmonary exam normal breath sounds clear to auscultation       Cardiovascular Normal cardiovascular exam+ dysrhythmias Atrial Fibrillation + pacemaker  Rhythm:Regular Rate:Normal     Neuro/Psych negative neurological ROS  negative psych ROS   GI/Hepatic GERD  Medicated,(+) Hepatitis -, A  Endo/Other  negative endocrine ROS  Renal/GU negative Renal ROS  negative genitourinary   Musculoskeletal negative musculoskeletal ROS (+)   Abdominal   Peds  Hematology negative hematology ROS (+)   Anesthesia Other Findings   Reproductive/Obstetrics                             Anesthesia Physical  Anesthesia Plan  ASA: III and emergent  Anesthesia Plan: General   Post-op Pain Management:    Induction: Intravenous  PONV Risk Score and Plan: 2 and Ondansetron, Dexamethasone and Treatment may vary due to age or medical condition  Airway Management Planned: Oral ETT  Additional Equipment: None  Intra-op Plan:   Post-operative Plan: Extubation in OR  Informed Consent: I have reviewed the patients History and Physical, chart, labs and discussed the procedure including the risks, benefits and alternatives for the proposed anesthesia with the patient or authorized representative who has indicated his/her understanding and acceptance.     Dental advisory given  Plan Discussed with: Anesthesiologist  Anesthesia Plan Comments:         Anesthesia Quick  Evaluation

## 2020-06-04 NOTE — Op Note (Signed)
Operative Note  GENEVA PALLAS  [id number]  [id number]  06/04/2020   Surgeon: Waynard Reeds FACS  Procedure performed: Diagnostic laparoscopy, lysis of adhesive band  Preop diagnosis: small bowel obstruction Post-op diagnosis/intraop findings: same, secondary to an adhesive band in the right lower quadrant/ internal hernia  Specimens: no Retained items: no EBL: minimal cc Complications: none  Description of procedure: After obtaining informed consent the patient was taken to the operating room and placed supine on operating room table wheregeneral endotracheal anesthesia was initiated, preoperative antibiotics were administered, SCDs applied, and a formal timeout was performed.  Foley catheter inserted which is removed at the end of the case.  The abdomen was prepped and draped in usual sterile fashion.  Peritoneal access was gained using an optical entry in the left upper quadrant.  Insufflation to 15 mmHg ensued without incident and gross inspection demonstrated no injury from her injury, diffusely dilated loops of small bowel, no adhesions to the anterior abdominal wall.  Under direct visualization 2 additional 5 mm trochars were placed in the left hemiabdomen.  The patient was placed in Trendelenburg with left rotation and the ileocecal valve identified.  Omental adhesions were noted in the right lower quadrant and pelvic sidewall from previous appendectomy, and there appeared to be a thin band adhesion through which a loop of small bowel had herniated causing the obstruction.  The band was divided sharply with immediate release of the bowel.  The bowel which had been entrapped by this band initially did appear inflamed and slightly dusky with an injected/edematous appearance of the mesentery.  The ileocecal valve was identified and the small bowel was run proximally all the way to the ligament of Treitz.  No other obstructing lesions were present.  We then ran the bowel again from  the ligament of Treitz back to the ileocecal valve to confirm this, maintaining normal anatomic orientation of the mesentery.  By the time we had finished running the bowel, the previously threatened segment of small bowel looked completely viable and well perfused.  There had been an area that appeared strictured down by the adhesion and this had already dilated up.  The abdomen at this point was desufflated and all trochars removed.  The skin incisions were closed with subcuticular 4-0 Monocryl and Dermabond. The patient was then awakened, extubated and taken to PACU in stable condition.   All counts were correct at the completion of the case.

## 2020-06-05 ENCOUNTER — Encounter (HOSPITAL_COMMUNITY): Payer: Self-pay | Admitting: Surgery

## 2020-06-05 DIAGNOSIS — I4811 Longstanding persistent atrial fibrillation: Secondary | ICD-10-CM

## 2020-06-05 DIAGNOSIS — K56609 Unspecified intestinal obstruction, unspecified as to partial versus complete obstruction: Secondary | ICD-10-CM | POA: Diagnosis not present

## 2020-06-05 DIAGNOSIS — D649 Anemia, unspecified: Secondary | ICD-10-CM | POA: Diagnosis not present

## 2020-06-05 LAB — COMPREHENSIVE METABOLIC PANEL
ALT: 23 U/L (ref 0–44)
AST: 31 U/L (ref 15–41)
Albumin: 2.8 g/dL — ABNORMAL LOW (ref 3.5–5.0)
Alkaline Phosphatase: 59 U/L (ref 38–126)
Anion gap: 10 (ref 5–15)
BUN: 27 mg/dL — ABNORMAL HIGH (ref 8–23)
CO2: 25 mmol/L (ref 22–32)
Calcium: 9.6 mg/dL (ref 8.9–10.3)
Chloride: 110 mmol/L (ref 98–111)
Creatinine, Ser: 0.8 mg/dL (ref 0.61–1.24)
GFR, Estimated: >60 mL/min (ref >60)
Glucose, Bld: 152 mg/dL — ABNORMAL HIGH (ref 70–99)
Potassium: 4 mmol/L (ref 3.5–5.1)
Sodium: 145 mmol/L (ref 135–145)
Total Bilirubin: 1.4 mg/dL — ABNORMAL HIGH (ref 0.3–1.2)
Total Protein: 5.5 g/dL — ABNORMAL LOW (ref 6.5–8.1)

## 2020-06-05 LAB — CBC WITH DIFFERENTIAL/PLATELET
Abs Immature Granulocytes: 0.15 10*3/uL — ABNORMAL HIGH (ref 0.00–0.07)
Basophils Absolute: 0 10*3/uL (ref 0.0–0.1)
Basophils Relative: 1 %
Eosinophils Absolute: 0 10*3/uL (ref 0.0–0.5)
Eosinophils Relative: 0 %
HCT: 33.3 % — ABNORMAL LOW (ref 39.0–52.0)
Hemoglobin: 10.7 g/dL — ABNORMAL LOW (ref 13.0–17.0)
Immature Granulocytes: 3 %
Lymphocytes Relative: 5 %
Lymphs Abs: 0.3 10*3/uL — ABNORMAL LOW (ref 0.7–4.0)
MCH: 31.7 pg (ref 26.0–34.0)
MCHC: 32.1 g/dL (ref 30.0–36.0)
MCV: 98.5 fL (ref 80.0–100.0)
Monocytes Absolute: 0.4 10*3/uL (ref 0.1–1.0)
Monocytes Relative: 8 %
Neutro Abs: 4.3 10*3/uL (ref 1.7–7.7)
Neutrophils Relative %: 83 %
Platelets: 143 10*3/uL — ABNORMAL LOW (ref 150–400)
RBC: 3.38 MIL/uL — ABNORMAL LOW (ref 4.22–5.81)
RDW: 13.6 % (ref 11.5–15.5)
WBC: 5.1 10*3/uL (ref 4.0–10.5)
nRBC: 0 % (ref 0.0–0.2)

## 2020-06-05 LAB — PHOSPHORUS: Phosphorus: 2.5 mg/dL (ref 2.5–4.6)

## 2020-06-05 LAB — HEPARIN LEVEL (UNFRACTIONATED): Heparin Unfractionated: 0.36 IU/mL (ref 0.30–0.70)

## 2020-06-05 LAB — MAGNESIUM: Magnesium: 2 mg/dL (ref 1.7–2.4)

## 2020-06-05 LAB — GLUCOSE, CAPILLARY: Glucose-Capillary: 117 mg/dL — ABNORMAL HIGH (ref 70–99)

## 2020-06-05 MED ORDER — HEPARIN (PORCINE) 25000 UT/250ML-% IV SOLN
1400.0000 [IU]/h | INTRAVENOUS | Status: DC
  Administered 2020-06-05 – 2020-06-06 (×3): 1400 [IU]/h via INTRAVENOUS
  Filled 2020-06-05 (×4): qty 250

## 2020-06-05 MED ORDER — IPRATROPIUM BROMIDE 0.02 % IN SOLN
0.5000 mg | Freq: Two times a day (BID) | RESPIRATORY_TRACT | Status: DC
  Administered 2020-06-05: 0.5 mg via RESPIRATORY_TRACT
  Filled 2020-06-05: qty 2.5

## 2020-06-05 MED ORDER — LEVALBUTEROL HCL 0.63 MG/3ML IN NEBU
0.6300 mg | INHALATION_SOLUTION | Freq: Two times a day (BID) | RESPIRATORY_TRACT | Status: DC
  Administered 2020-06-05: 0.63 mg via RESPIRATORY_TRACT
  Filled 2020-06-05: qty 3

## 2020-06-05 MED ORDER — HEPARIN BOLUS VIA INFUSION
2000.0000 [IU] | Freq: Once | INTRAVENOUS | Status: AC
  Administered 2020-06-05: 2000 [IU] via INTRAVENOUS
  Filled 2020-06-05: qty 2000

## 2020-06-05 NOTE — Evaluation (Signed)
Occupational Therapy Evaluation Patient Details Name: SHANT HENCE MRN: [id number] DOB: October 27, 1945 Today's Date: 06/05/2020    History of Present Illness TRES GRZYWACZ is a 75 y.o. 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 Patient found to have SBO which failed conservative treament and now s/p Diagnostic laparoscopy, lysis of adhesive band 3/29.   Clinical Impression   Mr. Traevion Poehler is a 75 year old man who is typically independent and active - reports using gym membership prior to March 13.. On evaluation patient able to perform bed mobility, able to stand and demonstrate normal balance as well as march in place. Reports ambulating in hall three times this morning with nursing or his wife. Patient exhibits functional strength and ability to perform all parts of ADLs and mobility. Patient has no OT needs at this time. Recommended shower chair for safety at home. Patient and spouse verbalized understanding.    Follow Up Recommendations  No OT follow up    Equipment Recommendations  Tub/shower seat    Recommendations for Other Services       Precautions / Restrictions Precautions Precautions: Other (comment) Precaution Comments: NG tube Restrictions Weight Bearing Restrictions: No      Mobility Bed Mobility Overal bed mobility: Modified Independent                  Transfers Overall transfer level: Modified independent                    Balance Overall balance assessment: No apparent balance deficits (not formally assessed)                                         ADL either performed or  assessed with clinical judgement   ADL Overall ADL's : Modified independent                                             Vision Patient Visual Report: No change from baseline       Perception     Praxis      Pertinent Vitals/Pain Pain Assessment: Faces Pain Location: low abdomen Pain Descriptors / Indicators: Sore Pain Intervention(s): Monitored during session     Hand Dominance Right   Extremity/Trunk Assessment Upper Extremity Assessment Upper Extremity Assessment: Overall WFL for tasks assessed   Lower Extremity Assessment Lower Extremity Assessment: Defer to PT evaluation   Cervical / Trunk Assessment Cervical / Trunk Assessment: Normal   Communication Communication Communication: No difficulties   Cognition Arousal/Alertness: Awake/alert Behavior During Therapy: WFL for tasks assessed/performed Overall Cognitive Status: Within Functional Limits for tasks assessed                                     General Comments  Able to resist three anterior nudges.    Exercises     Shoulder Instructions      Home Living Family/patient expects to be discharged to::  Private residence Living Arrangements: Spouse/significant other Available Help at Discharge: Family Type of Home: House Home Access: Stairs to enter Technical brewer of Steps: 2 Entrance Stairs-Rails: None Home Layout: Multi-level;1/2 bath on main level;Bed/bath upstairs (3 levels)     Bathroom Shower/Tub: Occupational psychologist: [place]: None          Prior Functioning/Environment Level of Independence: Independent        Comments: Reports being active at [employer] prior to March 14.        OT Problem List: Pain      OT Treatment/Interventions:      OT Goals(Current goals can be found in the care plan section) Acute Rehab OT Goals OT Goal Formulation: All assessment and education complete, DC therapy  OT  Frequency:     Barriers to D/C:            Co-evaluation              AM-PAC OT "6 Clicks" Daily Activity     Outcome Measure Help from another person eating meals?: None Help from another person taking care of personal grooming?: None Help from another person toileting, which includes using toliet, bedpan, or urinal?: None Help from another person bathing (including washing, rinsing, drying)?: None Help from another person to put on and taking off regular upper body clothing?: None Help from another person to put on and taking off regular lower body clothing?: None 6 Click Score: 24   End of Session Nurse Communication:  (okay to see per RN)  Activity Tolerance: Patient tolerated treatment well Patient left: in chair;with call bell/phone within reach;with family/visitor present  OT Visit Diagnosis: Pain                Time: 8185-6314 OT Time Calculation (min): 15 min Charges:  OT General Charges $OT Visit: 1 Visit OT Evaluation $OT Eval Low Complexity: 1 Low  Zendayah Hardgrave, OTR/L [place]  Office [phone] Pager: [phone]   Lenward Chancellor 06/05/2020, 10:53 AM

## 2020-06-05 NOTE — Progress Notes (Signed)
Pt had and tolerated his shower well. Heparin restarted after shower.  Jerene Pitch.

## 2020-06-05 NOTE — Discharge Instructions (Signed)
[facility] ° °Please arrive at least 30 min before your appointment to complete your check in paperwork.  If you are unable to arrive 30 min prior to your appointment time we may have to cancel or reschedule you. °LAPAROSCOPIC SURGERY: POST OP INSTRUCTIONS °Always review your discharge instruction sheet given to you by the facility where your surgery was performed. °IF YOU HAVE DISABILITY OR FAMILY LEAVE FORMS, YOU MUST BRING THEM TO THE OFFICE FOR PROCESSING.   °DO NOT GIVE THEM TO YOUR DOCTOR. ° °PAIN CONTROL ° °1. First take acetaminophen (Tylenol) AND/or ibuprofen (Advil) to control your pain after surgery.  Follow directions on package.  Taking acetaminophen (Tylenol) and/or ibuprofen (Advil) regularly after surgery will help to control your pain and lower the amount of prescription pain medication you may need.  You should not take more than 4,000 mg (4 grams) of acetaminophen (Tylenol) in 24 hours.  You should not take ibuprofen (Advil), aleve, motrin, naprosyn or other NSAIDS if you have a history of stomach ulcers or chronic kidney disease.  °2. A prescription for pain medication may be given to you upon discharge.  Take your pain medication as prescribed, if you still have uncontrolled pain after taking acetaminophen (Tylenol) or ibuprofen (Advil). °3. Use ice packs to help control pain. °4. If you need a refill on your pain medication, please contact your pharmacy.  They will contact our office to request authorization. Prescriptions will not be filled after 5pm or on week-ends. ° °HOME MEDICATIONS °5. Take your usually prescribed medications unless otherwise directed. ° °DIET °6. You should follow a light diet the first few days after arrival home.  Be sure to include lots of fluids daily. Avoid fatty, fried foods.  ° °CONSTIPATION °7. It is common to experience some constipation after surgery and if you are taking pain medication.  Increasing fluid intake and taking a stool  softener (such as Colace) will usually help or prevent this problem from occurring.  A mild laxative (Milk of Magnesia or Miralax) should be taken according to package instructions if there are no bowel movements after 48 hours. ° °WOUND/INCISION CARE °8. Most patients will experience some swelling and bruising in the area of the incisions.  Ice packs will help.  Swelling and bruising can take several days to resolve.  °9. Unless discharge instructions indicate otherwise, follow guidelines below  °a. STERI-STRIPS - you may remove your outer bandages 48 hours after surgery, and you may shower at that time.  You have steri-strips (small skin tapes) in place directly over the incision.  These strips should be left on the skin for 7-10 days.   °b. DERMABOND/SKIN GLUE - you may shower in 24 hours.  The glue will flake off over the next 2-3 weeks. °10. Any sutures or staples will be removed at the office during your follow-up visit. ° °ACTIVITIES °11. You may resume regular (light) daily activities beginning the next day--such as daily self-care, walking, climbing stairs--gradually increasing activities as tolerated.  You may have sexual intercourse when it is comfortable.  Refrain from any heavy lifting or straining until approved by your doctor. °a. You may drive when you are no longer taking prescription pain medication, you can comfortably wear a seatbelt, and you can safely maneuver your car and apply brakes. ° °FOLLOW-UP °12. You should see your doctor in the office for a follow-up appointment approximately 2-3 weeks after your surgery.  You should have been given your post-op/follow-up appointment when   your surgery was scheduled.  If you did not receive a post-op/follow-up appointment, make sure that you call for this appointment within a day or two after you arrive home to insure a convenient appointment time. ° °OTHER INSTRUCTIONS ° °WHEN TO CALL YOUR DOCTOR: °1. Fever over 101.0 °2. Inability to  urinate °3. Continued bleeding from incision. °4. Increased pain, redness, or drainage from the incision. °5. Increasing abdominal pain ° °The clinic staff is available to answer your questions during regular business hours.  Please don’t hesitate to call and ask to speak to one of the nurses for clinical concerns.  If you have a medical emergency, go to the nearest emergency room or call 911.  A surgeon from [facility] is always on call at the hospital. °[address], [address], [place], [place]  [address] ? [address] [address], [place], [place]   [address] °[phone] ? [phone] ? FAX [phone] ° ° ° °

## 2020-06-05 NOTE — Progress Notes (Signed)
PHARMACY - HEPARIN (brief note)  IV heparin gtt infusing @ 1400 units/hr for AFib  Heparin level = 0.36 (goal 0.3-0.7) therapeutic No complications of therapy noted  Plan:   Continue IV heparin @ 1400 units/hr F/U AM heparin leve and CBC  Leone Haven, PharmD 06/05/20 @ 23:05

## 2020-06-05 NOTE — Progress Notes (Signed)
PT Cancellation Note  Patient Details Name: Todd Chapman MRN: [id number] DOB: 04/07/1945   Cancelled Treatment:    Reason Eval/Treat Not Completed: PT screened, no needs identified, will sign off, OT has evaluated patient and patient is ambulating , no skilled needs.   Claretha Cooper 06/05/2020, 4:27 PM [place] Pager [phone] Office [phone]

## 2020-06-05 NOTE — Anesthesia Postprocedure Evaluation (Signed)
Anesthesia Post Note  Patient: Todd Chapman  Procedure(s) Performed: POSSIBLE LAPAROTOMY POSSIBLE BOWEL RESSECTION (N/A Abdomen)     Patient location during evaluation: PACU Anesthesia Type: General Level of consciousness: awake and alert Pain management: pain level controlled Vital Signs Assessment: post-procedure vital signs reviewed and stable Respiratory status: spontaneous breathing, nonlabored ventilation, respiratory function stable and patient connected to nasal cannula oxygen Cardiovascular status: blood pressure returned to baseline and stable Postop Assessment: no apparent nausea or vomiting Anesthetic complications: no   No complications documented.  Last Vitals:  Vitals:   06/05/20 1659 06/05/20 1954  BP: 130/88   Pulse: 89   Resp: 20   Temp: 37 C   SpO2: 96% 96%    Last Pain:  Vitals:   06/05/20 1659  TempSrc: Oral  PainSc:                  Alexei Ey

## 2020-06-05 NOTE — Progress Notes (Signed)
Patient ambulated with front wheeled walker and SBA around 4 west unit x 3 (1080 ft).  Tolerated well with steady gait.  After walk medicated with morphine 1mg  IV for c/o pain 7/10 abdominal.

## 2020-06-05 NOTE — Progress Notes (Signed)
ANTICOAGULATION CONSULT NOTE - Initial Consult  Pharmacy Consult for Heparin Indication: atrial fibrillation  Allergies  Allergen Reactions  . Atorvastatin Palpitations  . Ezetimibe Palpitations    Patient Measurements: Height: 6\' 2"  (188 cm) Weight: 89.9 kg (198 lb 3.1 oz) IBW/kg (Calculated) : 82.2 Heparin Dosing Weight: total weight  Vital Signs: Temp: 98 F (36.7 C) (03/30 0446) Temp Source: Oral (03/30 0446) BP: 181/126 (03/30 1116) Pulse Rate: 72 (03/30 0446)  Labs: Recent Labs    06/03/20 0351 06/04/20 0412 06/05/20 0420  HGB 10.6* 11.7* 10.7*  HCT 32.6* 36.6* 33.3*  PLT 132* 145* 143*  CREATININE 1.23 0.87 0.80    Estimated Creatinine Clearance: 89.9 mL/min (by C-G formula based on SCr of 0.8 mg/dL).   Medical History: Past Medical History:  Diagnosis Date  . Arthritis    "thumbs, back" (02/16/2017)  . Bradycardia 02/2017  . Dyspnea    with exertion  . Flu 2 weeks ago  . GERD (gastroesophageal reflux disease)   . Hepatitis A as child  . HLD (hyperlipidemia)   . Persistent atrial fibrillation ([place]) 12/18/2016  . Pneumonia 1999; 2018  . 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 Prostate cancer ([place])     Medications:  Scheduled:  . heparin  2,000 Units Intravenous Once  . ipratropium  0.5 mg Nebulization BID  . levalbuterol  0.63 mg Nebulization BID  . mouth rinse  15 mL Mouth Rinse BID  . pantoprazole (PROTONIX) IV  40 mg Intravenous QHS   Infusions:  . sodium chloride 75 mL/hr at 06/05/20 1009  . ampicillin-sulbactam (UNASYN) IV 3 g (06/05/20 0552)  . heparin    . methocarbamol (ROBAXIN) IV     PRN: acetaminophen **OR** acetaminophen, hydrALAZINE, methocarbamol (ROBAXIN) IV, morphine injection, ondansetron **OR** ondansetron (ZOFRAN) IV  Assessment: 75 yo male with history of PAF on apixaban admitted with SBO s/p ex-lap with LOA 3/29. Pharmacy consulted to dose IV heparin while apixaban on hold.  Last dose of apixaban  3/25 prior to admit.  Last dose of lovenox 40mg  3/29 at 20:56  Goal of Therapy:  Heparin level 0.3-0.7 units/ml Monitor platelets by anticoagulation protocol: Yes   Plan:  Heparin 2000 units IV bolus x 1 Heparin IV infusion 1400 units/hr Check heparin level in 8hrs Daily heparin level and CBC Follow up ability to transition back to apixaban  Peggyann Juba, PharmD, BCPS Pharmacy: [phone] 06/05/2020,12:04 PM

## 2020-06-05 NOTE — Progress Notes (Signed)
[facility] Progress Note  1 Day Post-Op  Subjective: CC-  Abdomen sore but overall feeling well. He has already been up ambulating twice this morning. Feeling more activity in his abdomen although not passing flatus yet. No BM. NG output up (1150cc) but we have had him on sips of liquids with NG tube in.  Objective: Vital signs in last 24 hours: Temp:  [98 F (36.7 C)-99 F (37.2 C)] 98 F (36.7 C) (03/30 0446) Pulse Rate:  [72-89] 72 (03/30 0446) Resp:  [12-26] 18 (03/30 0446) BP: (124-158)/(71-102) 142/95 (03/30 0446) SpO2:  [95 %-100 %] 96 % (03/30 0736) Weight:  [89.9 kg] 89.9 kg (03/30 0448) Last BM Date: 05/27/20  Intake/Output from previous day: 03/29 0701 - 03/30 0700 In: 1074.6 [I.V.:774.6; IV Piggyback:300] Out: 1660 [Urine:500; Emesis/NG output:1150; Blood:10] Intake/Output this shift: No intake/output data recorded.  PE: Gen:  Alert, NAD, pleasant Pulm:  rate and effort normal on room air Abd: Soft, mild distension, minimal TTP over incisions, +BS, lap incisions cdi without erythema or drainage Skin: no rashes noted, warm and dry  Lab Results:  Recent Labs    06/04/20 0412 06/05/20 0420  WBC 9.4 5.1  HGB 11.7* 10.7*  HCT 36.6* 33.3*  PLT 145* 143*   BMET Recent Labs    06/04/20 0412 06/05/20 0420  NA 145 145  K 3.6 4.0  CL 106 110  CO2 27 25  GLUCOSE 113* 152*  BUN 30* 27*  CREATININE 0.87 0.80  CALCIUM 10.5* 9.6   PT/INR No results for input(s): LABPROT, INR in the last 72 hours. CMP     Component Value Date/Time   NA 145 06/05/2020 0420   NA 141 02/08/2017 1417   K 4.0 06/05/2020 0420   CL 110 06/05/2020 0420   CO2 25 06/05/2020 0420   GLUCOSE 152 (H) 06/05/2020 0420   BUN 27 (H) 06/05/2020 0420   BUN 14 02/08/2017 1417   CREATININE 0.80 06/05/2020 0420   CALCIUM 9.6 06/05/2020 0420   PROT 5.5 (L) 06/05/2020 0420   ALBUMIN 2.8 (L) 06/05/2020 0420   AST 31 06/05/2020 0420   ALT 23 06/05/2020 0420   ALKPHOS 59  06/05/2020 0420   BILITOT 1.4 (H) 06/05/2020 0420   GFRNONAA >60 06/05/2020 0420   GFRAA >60 02/17/2017 0517   Lipase     Component Value Date/Time   LIPASE 21 06/01/2020 1307       Studies/Results: DG Abd 1 View  Result Date: 06/04/2020 CLINICAL DATA:  Small-bowel obstruction EXAM: ABDOMEN - 1 VIEW COMPARISON:  06/03/2020 FINDINGS: Nasogastric tube tip is seen just beyond the expected gastroesophageal junction within the expected proximal body of the stomach. Multiple gas-filled dilated loops of small bowel are seen within the mid abdomen in keeping with a mid to distal small bowel obstruction. The caliber of dilated small bowel loops appears slightly progressive since prior examination. Relatively little gas and stool is seen throughout the large bowel, similar to prior examination. No gross free intraperitoneal gas. IMPRESSION: Findings in keeping with a mid to distal small bowel obstruction. Slight interval progressive distension of multiple loops of gas-filled small bowel. Electronically Signed   By: Fidela Salisbury MD   On: 06/04/2020 05:34   DG CHEST PORT 1 VIEW  Result Date: 06/04/2020 CLINICAL DATA:  Small-bowel obstruction, dyspnea EXAM: PORTABLE CHEST 1 VIEW COMPARISON:  06/01/2020 FINDINGS: Nasogastric tube extends into the upper abdomen beyond the margin of the examination. Lung volumes are small, but are symmetric. Pulmonary  insufflation has decreased slightly since prior examination. There is increasing multifocal pulmonary infiltrate throughout the right lung, likely infectious in the acute setting. Minimal left basilar atelectasis has now developed. No pneumothorax or pleural effusion. Cardiac size is mildly enlarged, unchanged. Left subclavian dual lead pacemaker is unchanged. Pulmonary vascularity is normal. IMPRESSION: Progressive pulmonary hypoinflation. Progressive multifocal pulmonary infiltrate within the right lung, likely infectious. Electronically Signed   By: Fidela Salisbury MD   On: 06/04/2020 05:32    Anti-infectives: Anti-infectives (From admission, onward)   Start     Dose/Rate Route Frequency Ordered Stop   06/04/20 1454  ceFAZolin (ANCEF) 2-4 GM/100ML-% IVPB  Status:  Discontinued       Note to Pharmacy: Georgena Spurling   : cabinet override      06/04/20 1454 06/04/20 1457   06/04/20 1351  ceFAZolin (ANCEF) 2-4 GM/100ML-% IVPB  Status:  Discontinued       Note to Pharmacy: Randa Evens  : cabinet override      06/04/20 1351 06/04/20 1457   06/01/20 1930  Ampicillin-Sulbactam (UNASYN) 3 g in sodium chloride 0.9 % 100 mL IVPB        3 g 200 mL/hr over 30 Minutes Intravenous Every 6 hours 06/01/20 1842         Assessment/Plan Hx prostate cancer with radiation Rx 12 days ago AKI/dehydration - improved, Cr 0.80 Persistent Atrial fibrillation  Bradycardia s/p PPM Leukopenia Elevated LFT - improved, 1.4 Hypercalcemia/hyperphosphatemia - resolved Thrombocytopenia - platelets 143 Right lower lobe Pneumonia - on unasyn. O2 sats stable on RA  SBOsecondary to an adhesive band in the right lower quadrant/ internal hernia S/p Diagnostic laparoscopy, lysis of adhesive band 3/29 Dr. Kae Heller - POD#1 - Clamp NG tube this morning and continue sips of clears. If patient becomes nauseated or distended then return NG to LIWS. Continue mobilizing. Will recheck later today for possible NG tube removal.   FEN: IVF, clamp NG and allow sips of clears ID: Unasyn 3/26 >> day#5 DVT: Lovenox Foley: none Follow up: Dr. Kae Heller   LOS: 4 days    Wellington Hampshire, [facility] 06/05/2020, 8:04 AM Please see Amion for pager number during day hours 7:00am-4:30pm

## 2020-06-05 NOTE — Progress Notes (Signed)
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  Found to have small bowel obstruction.  Hospitalized for further management.  Seen by general surgery.  Underwent exploratory laparotomy with lysis of adhesions on 3/29.  Consultants: General surgery  Procedures: Exploratory laparotomy and adhesion lysis on 3/29  Antibiotics: Anti-infectives (From admission, onward)   Start     Dose/Rate Route Frequency Ordered Stop   06/04/20 1454  ceFAZolin (ANCEF) 2-4 GM/100ML-% IVPB  Status:  Discontinued       Note to Pharmacy: Georgena Spurling   : cabinet override      06/04/20 1454 06/04/20 1457   06/04/20 1351  ceFAZolin (ANCEF) 2-4 GM/100ML-% IVPB  Status:  Discontinued       Note to Pharmacy: Randa Evens  : cabinet override      06/04/20 1351 06/04/20 1457   06/01/20 1930  Ampicillin-Sulbactam (UNASYN) 3 g in sodium chloride 0.9 % 100 mL IVPB        3 g 200 mL/hr over 30 Minutes Intravenous Every 6 hours 06/01/20 1842        Subjective/Interval History: Patient mentions that he had some pain and discomfort earlier this morning with some nausea but better after he was medicated.  His family is at the bedside.  He is asking about when he can resume his anticoagulation for his atrial fibrillation.     Assessment/Plan:  Small bowel obstruction, high-grade General surgery is following.  Patient did not improve with conservative management.  Subsequently underwent exploratory laparotomy lysis of adhesions  on 3/29.  General surgery is following and managing.  Pain medications and antiemetics as needed.  He does have good bowel sounds on exam today.    Right lower lobe pneumonia/aspiration pneumonia/acute respiratory failure with hypoxia Has been weaned off of oxygen.  Respiratory status seems to be stable.  He was wheezing and was started on nebulizer treatment.  Influenza and COVID-19 test were negative.  Patient was started on Unasyn with plan to give a 5-day course.  Respiratory status is stable.  History of prostate cancer Recently completed radiation treatment.  Will need to follow-up with urology and radiation oncology as outpatient.  Acute kidney injury Presented with creatinine of 1.43 and has improved.  He was also noted to have electrolyte abnormalities which were corrected.  Monitor urine output.  Renal function back to baseline.  Persistent atrial fibrillation/history of bradycardia status post pacemaker Followed by Dr. Rayann Heman.  Takes Eliquis at home which is currently on hold.  Will discuss with surgery as to when his anticoagulation can be resumed.  May need to put him on intravenous heparin infusion or full dose Lovenox in the interim.  Currently on prophylactic doses of Lovenox.  Not on rate limiting drugs at home.  Hyperbilirubinemia Likely due to acute illness.  Has improved.  Abdomen is otherwise benign.  No indication for further work-up at this time.  Outpatient monitoring.  Normocytic anemia/leukopenia Mild drop in hemoglobin likely due to dilution as well as surgery.  No other overt loss noted.  Continue to monitor.  WBC has improved.  Hypercalcemia Appears to have improved.  Likely due to dehydration.  Prediabetes HbA1c 5.9.  Outpatient monitoring.  GERD On PPI  Hyperlipidemia Statin is on hold.  Questionable hematuria Outpatient monitoring by urology considering his history of prostate cancer.   DVT Prophylaxis: Currently on Lovenox Code Status: Full  code Family Communication: Discussed with the patient and his family members Disposition Plan: Hopefully return home when improved  Status is: Inpatient  Remains inpatient appropriate because:IV treatments appropriate due to intensity of illness or inability to take PO and Inpatient level of care appropriate due to severity of illness   Dispo: The patient is from: Home              Anticipated d/c is to: Home              Patient currently is not medically stable to d/c.   Difficult to place patient No     Medications:  Scheduled: . enoxaparin (LOVENOX) injection  40 mg Subcutaneous Q24H  . ipratropium  0.5 mg Nebulization BID  . levalbuterol  0.63 mg Nebulization BID  . mouth rinse  15 mL Mouth Rinse BID  . pantoprazole (PROTONIX) IV  40 mg Intravenous QHS   Continuous: . sodium chloride 75 mL/hr at 06/05/20 1009  . ampicillin-sulbactam (UNASYN) IV 3 g (06/05/20 0552)  . methocarbamol (ROBAXIN) IV     NAT:[id number] **OR** acetaminophen, hydrALAZINE, methocarbamol (ROBAXIN) IV, morphine injection, ondansetron **OR** ondansetron (ZOFRAN) IV   Objective:  Vital Signs  Vitals:   06/04/20 2355 06/05/20 0446 06/05/20 0448 06/05/20 0736  BP: 124/73 (!) 142/95    Pulse: 77 72    Resp: 16 18    Temp: 98.4 F (36.9 C) 98 F (36.7 C)    TempSrc: Oral Oral    SpO2: 98% 100%  96%  Weight:   89.9 kg   Height:        Intake/Output Summary (Last 24 hours) at 06/05/2020 1046 Last data filed at 06/05/2020 0631 Gross per 24 hour  Intake 1074.63 ml  Output 1660 ml  Net -585.37 ml   Filed Weights   06/01/20 1256 06/03/20 0500 06/05/20 0448  Weight: 93 kg 92.3 kg 89.9 kg    General appearance: Awake alert.  In no distress Resp: Clear to auscultation bilaterally.  Normal effort Cardio: S1-S2 is normal regular.  No S3-S4.  No rubs murmurs or bruit GI: Abdomen is soft.  Bowel sounds appreciated.  No masses organomegaly.  Extremities: No edema.  Full range of motion of  lower extremities. Neurologic: Alert and oriented x3.  No focal neurological deficits.    Lab Results:  Data Reviewed: I have personally reviewed following labs and imaging studies  CBC: Recent Labs  Lab 06/01/20 1307 06/02/20 0346 06/03/20 0351 06/04/20 0412 06/05/20 0420  WBC 3.5* 9.6 7.8 9.4 5.1  NEUTROABS 2.8  --  6.9 8.3* 4.3  HGB 14.5 11.8* 10.6* 11.7* 10.7*  HCT 43.5 34.9* 32.6* 36.6* 33.3*  MCV 92.9 94.1 97.3 98.4 98.5  PLT 191 154 132* 145* 143*    Basic Metabolic Panel: Recent Labs  Lab 06/01/20 1307 06/02/20 0346 06/03/20 0351 06/04/20 0412 06/05/20 0420  NA 135 137 139 145 145  K 3.5 3.9 3.5 3.6 4.0  CL 91* 96* 101 106 110  CO2 30 29 31 27 25   GLUCOSE 131* 121* 119* 113* 152*  BUN 24* 36* 33* 30* 27*  CREATININE 1.43* 1.57* 1.23 0.87 0.80  CALCIUM 11.1* 9.8 10.1 10.5* 9.6  MG  --  2.3 2.4 2.1  2.0  PHOS  --  4.7* 2.5 1.8* 2.5    GFR: Estimated Creatinine Clearance: 89.9 mL/min (by C-G formula based on SCr of 0.8 mg/dL).  Liver Function Tests: Recent Labs  Lab 06/01/20 1307 06/02/20 0346 06/03/20 0351 06/04/20 0412 06/05/20 0420  AST 29 33 25 25 31   ALT 21 22 19 19 23   ALKPHOS 70 58 53 60 59  BILITOT 2.0* 2.3* 2.2* 2.6* 1.4*  PROT 6.7 6.1* 5.9* 6.7 5.5*  ALBUMIN 4.4 3.4* 3.2* 3.6 2.8*    Recent Labs  Lab 06/01/20 1307  LIPASE 21   CBG: Recent Labs  Lab 06/02/20 0751 06/02/20 1132 06/03/20 0832 06/04/20 0749 06/05/20 0830  GLUCAP 120* 120* 112* 107* 117*    Anemia Panel: Recent Labs    06/03/20 0351  VITAMINB12 235  FOLATE 9.3  FERRITIN 216  TIBC 231*  IRON 9*  RETICCTPCT 1.1    Recent Results (from the past 240 hour(s))  Resp Panel by RT-PCR (Flu A&B, Covid) Nasopharyngeal Swab     Status: None   Collection Time: 06/01/20  3:38 PM   Specimen: Nasopharyngeal Swab; Nasopharyngeal(NP) swabs in vial transport medium  Result Value Ref Range Status   SARS Coronavirus 2 by RT PCR NEGATIVE NEGATIVE Final    Comment:  (NOTE) SARS-CoV-2 target nucleic acids are NOT DETECTED.  The SARS-CoV-2 RNA is generally detectable in upper respiratory specimens during the acute phase of infection. The lowest concentration of SARS-CoV-2 viral copies this assay can detect is 138 copies/mL. A negative result does not preclude SARS-Cov-2 infection and should not be used as the sole basis for treatment or other patient management decisions. A negative result may occur with  improper specimen collection/handling, submission of specimen other than nasopharyngeal swab, presence of viral mutation(s) within the areas targeted by this assay, and inadequate number of viral copies(<138 copies/mL). A negative result must be combined with clinical observations, patient history, and epidemiological information. The expected result is Negative.  Fact Sheet for Patients:  EntrepreneurPulse.com.au  Fact Sheet for Healthcare Providers:  IncredibleEmployment.be  This test is no t yet approved or cleared by the [place] FDA and  has been authorized for detection and/or diagnosis of SARS-CoV-2 by FDA under an Emergency Use Authorization (EUA). This EUA will remain  in effect (meaning this test can be used) for the duration of the COVID-19 declaration under Section 564(b)(1) of the Act, 21 U.S.C.section 360bbb-3(b)(1), unless the authorization is terminated  or revoked sooner.       Influenza A by PCR NEGATIVE NEGATIVE Final   Influenza B by PCR NEGATIVE NEGATIVE Final    Comment: (NOTE) The Xpert Xpress SARS-CoV-2/FLU/RSV plus assay is intended as an aid in the diagnosis of influenza from Nasopharyngeal swab specimens and should not be used as a sole basis for treatment. Nasal washings and aspirates are unacceptable for Xpert Xpress SARS-CoV-2/FLU/RSV testing.  Fact Sheet for Patients: EntrepreneurPulse.com.au  Fact Sheet for Healthcare  Providers: IncredibleEmployment.be  This test is not yet approved or cleared by the [place] FDA and has been authorized for detection and/or diagnosis of SARS-CoV-2 by FDA under an Emergency Use Authorization (EUA). This EUA will remain in effect (meaning this test can be used) for the duration of the COVID-19 declaration under Section 564(b)(1) of the Act, 21 U.S.C. section 360bbb-3(b)(1), unless the authorization is terminated or revoked.  Performed at [facility]   Culture, blood (routine x 2) Call MD if unable to obtain prior to antibiotics being  given     Status: None (Preliminary result)   Collection Time: 06/01/20  6:50 PM   Specimen: BLOOD LEFT WRIST  Result Value Ref Range Status   Specimen Description   Final    BLOOD LEFT WRIST Performed at [facility], [address], [place], [place] [address]    Special Requests   Final    BOTTLES DRAWN AEROBIC AND ANAEROBIC Blood Culture adequate volume Performed at [facility] [address] [place], [place] [address]    Culture   Final    NO GROWTH 3 DAYS Performed at [facility], [place] [address], [place], [place] [address]    Report Status PENDING  Incomplete  Culture, blood (routine x 2) Call MD if unable to obtain prior to antibiotics being given     Status: None (Preliminary result)   Collection Time: 06/01/20  6:56 PM   Specimen: BLOOD  Result Value Ref Range Status   Specimen Description   Final    BLOOD RIGHT ANTECUBITAL Performed at [facility] [address] [place], [place] [address]    Special Requests   Final    BOTTLES DRAWN AEROBIC ONLY Blood Culture adequate volume Performed at [facility] [address] [place], [place] [address]    Culture   Final    NO GROWTH 3 DAYS Performed at [facility], [place] [address] [place], [place] [address]    Report Status PENDING  Incomplete       Radiology Studies: DG Abd 1 View  Result Date: 06/04/2020 CLINICAL DATA:  Small-bowel obstruction EXAM: ABDOMEN - 1 VIEW COMPARISON:  06/03/2020 FINDINGS: Nasogastric tube tip is seen just beyond the expected gastroesophageal junction within the expected proximal body of the stomach. Multiple gas-filled dilated loops of small bowel are seen within the mid abdomen in keeping with a mid to distal small bowel obstruction. The caliber of dilated small bowel loops appears slightly progressive since prior examination. Relatively little gas and stool is seen throughout the large bowel, similar to prior examination. No gross free intraperitoneal gas. IMPRESSION: Findings in keeping with a mid to distal small bowel obstruction. Slight interval progressive distension of multiple loops of gas-filled small bowel. Electronically Signed   By: Fidela Salisbury MD   On: 06/04/2020 05:34   DG CHEST PORT 1 VIEW  Result Date: 06/04/2020 CLINICAL DATA:  Small-bowel obstruction, dyspnea EXAM: PORTABLE CHEST 1 VIEW COMPARISON:  06/01/2020 FINDINGS: Nasogastric tube extends into the upper abdomen beyond the margin of the examination. Lung volumes are small, but are symmetric. Pulmonary insufflation has decreased slightly since prior examination. There is increasing multifocal pulmonary infiltrate throughout the right lung, likely infectious in the acute setting. Minimal left basilar atelectasis has now developed. No pneumothorax or pleural effusion. Cardiac size is mildly enlarged, unchanged. Left subclavian dual lead pacemaker is unchanged. Pulmonary vascularity is normal. IMPRESSION: Progressive pulmonary hypoinflation. Progressive multifocal pulmonary infiltrate within the right lung, likely infectious. Electronically Signed   By: Fidela Salisbury MD   On: 06/04/2020 05:32       LOS: 4 days   [facility] Pager on [email or web address]  06/05/2020, 10:46 AM

## 2020-06-06 DIAGNOSIS — K56609 Unspecified intestinal obstruction, unspecified as to partial versus complete obstruction: Secondary | ICD-10-CM | POA: Diagnosis not present

## 2020-06-06 LAB — CBC
HCT: 30.9 % — ABNORMAL LOW (ref 39.0–52.0)
Hemoglobin: 9.8 g/dL — ABNORMAL LOW (ref 13.0–17.0)
MCH: 31 pg (ref 26.0–34.0)
MCHC: 31.7 g/dL (ref 30.0–36.0)
MCV: 97.8 fL (ref 80.0–100.0)
Platelets: 163 10*3/uL (ref 150–400)
RBC: 3.16 MIL/uL — ABNORMAL LOW (ref 4.22–5.81)
RDW: 13.5 % (ref 11.5–15.5)
WBC: 7.3 10*3/uL (ref 4.0–10.5)
nRBC: 0 % (ref 0.0–0.2)

## 2020-06-06 LAB — MAGNESIUM: Magnesium: 1.8 mg/dL (ref 1.7–2.4)

## 2020-06-06 LAB — GLUCOSE, CAPILLARY: Glucose-Capillary: 71 mg/dL (ref 70–99)

## 2020-06-06 LAB — BASIC METABOLIC PANEL
Anion gap: 6 (ref 5–15)
BUN: 29 mg/dL — ABNORMAL HIGH (ref 8–23)
CO2: 26 mmol/L (ref 22–32)
Calcium: 9.2 mg/dL (ref 8.9–10.3)
Chloride: 109 mmol/L (ref 98–111)
Creatinine, Ser: 0.79 mg/dL (ref 0.61–1.24)
GFR, Estimated: >60 mL/min (ref >60)
Glucose, Bld: 116 mg/dL — ABNORMAL HIGH (ref 70–99)
Potassium: 3.8 mmol/L (ref 3.5–5.1)
Sodium: 141 mmol/L (ref 135–145)

## 2020-06-06 LAB — HEPARIN LEVEL (UNFRACTIONATED): Heparin Unfractionated: 0.4 IU/mL (ref 0.30–0.70)

## 2020-06-06 MED ORDER — POLYETHYLENE GLYCOL 3350 17 G PO PACK: 17.0000 g | PACK | Freq: Every day | ORAL | 0 refills | Status: DC | PRN

## 2020-06-06 MED ORDER — TRAMADOL HCL 50 MG PO TABS: 50.0000 mg | ORAL_TABLET | Freq: Four times a day (QID) | ORAL | Status: DC | PRN

## 2020-06-06 MED ORDER — DOCUSATE SODIUM 100 MG PO CAPS: 100.0000 mg | ORAL_CAPSULE | Freq: Two times a day (BID) | ORAL | 0 refills | Status: AC

## 2020-06-06 MED ORDER — IPRATROPIUM BROMIDE 0.02 % IN SOLN: 0.5000 mg | Freq: Four times a day (QID) | RESPIRATORY_TRACT | Status: DC | PRN

## 2020-06-06 MED ORDER — CHLORHEXIDINE GLUCONATE CLOTH 2 % EX PADS: 6.0000 | MEDICATED_PAD | Freq: Once | CUTANEOUS | Status: DC

## 2020-06-06 MED ORDER — MAGNESIUM SULFATE 2 GM/50ML IV SOLN
2.0000 g | Freq: Once | INTRAVENOUS | Status: AC
  Administered 2020-06-06: 2 g via INTRAVENOUS
  Filled 2020-06-06: qty 50

## 2020-06-06 MED ORDER — SODIUM CHLORIDE 0.9 % IV SOLN: 2.0000 g | INTRAVENOUS | Status: DC

## 2020-06-06 MED ORDER — POTASSIUM CHLORIDE 10 MEQ/100ML IV SOLN
10.0000 meq | INTRAVENOUS | Status: AC
Start: 2020-06-06 — End: 2020-06-06
  Administered 2020-06-06 (×2): 10 meq via INTRAVENOUS
  Filled 2020-06-06: qty 100

## 2020-06-06 MED ORDER — LEVALBUTEROL HCL 0.63 MG/3ML IN NEBU: 0.6300 mg | INHALATION_SOLUTION | Freq: Four times a day (QID) | RESPIRATORY_TRACT | Status: DC | PRN

## 2020-06-06 MED ORDER — ACETAMINOPHEN 500 MG PO TABS
1000.0000 mg | ORAL_TABLET | Freq: Four times a day (QID) | ORAL | Status: DC
  Administered 2020-06-06 (×2): 1000 mg via ORAL
  Filled 2020-06-06 (×2): qty 2

## 2020-06-06 MED ORDER — MORPHINE SULFATE (PF) 2 MG/ML IV SOLN: 1.0000 mg | Freq: Four times a day (QID) | INTRAVENOUS | Status: DC | PRN

## 2020-06-06 MED ORDER — OXYCODONE-ACETAMINOPHEN 5-325 MG PO TABS: 1.0000 | ORAL_TABLET | Freq: Four times a day (QID) | ORAL | 0 refills | Status: AC | PRN

## 2020-06-06 NOTE — Discharge Summary (Signed)
[facility]  Physician Discharge Summary   Patient ID: Todd Chapman MRN: [id number] DOB/AGE: [birth year] 75 y.o.  Admit date: 06/01/2020 Discharge date: 06/07/2020  PCP: [name], MD  DISCHARGE DIAGNOSES:  High-grade small bowel obstruction status post exploratory laparotomy and lysis of adhesions Aspiration pneumonia with acute respiratory failure with hypoxia, resolved History of prostate cancer Acute kidney injury, resolved Persistent atrial fibrillation on anticoagulation Pacemaker in situ Normocytic anemia  RECOMMENDATIONS FOR OUTPATIENT FOLLOW UP: 1. Outpatient follow-up with general surgery as recommended by them   Home Health: None Equipment/Devices: None  CODE STATUS: Full code  DISCHARGE CONDITION: fair  Diet recommendation: As recommended by general surgery  INITIAL HISTORY: 75 y.o.malewith medical history significantfor but not limited tooGERD, hyperlipidemia, persistent atrial fibrillation on anticoagulation with Eliquis, history of bradycardia status post permanent pacemaker, history of malignant neoplasm of the prostate currently receiving radiation as well as other comorbidities who presented withnausea and vomiting as well as abdominal pain.  Found to have small bowel obstruction.  Hospitalized for further management.  Seen by general surgery.  Underwent exploratory laparotomy with lysis of adhesions on 3/29.  Consultants: General surgery  Procedures: Exploratory laparotomy and adhesion lysis on 3/29   HOSPITAL COURSE:   Small bowel obstruction, high-grade Patient was seen by general surgery Patient did not improve with conservative management.  Subsequently underwent exploratory laparotomy lysis of adhesions on 3/29.    Gradually improved.  Diet was slowly advanced.  Cleared by surgery for discharge today.   Right lower lobe pneumonia/aspiration pneumonia/acute respiratory failure with hypoxia Has been weaned off of oxygen.   Respiratory status seems to be stable.  He was wheezing and was started on nebulizer treatment.  Influenza and COVID-19 test were negative.    Patient treated with Unasyn for 5 days.  Respiratory status is much better now.    History of prostate cancer Recently completed radiation treatment.  Will need to follow-up with urology and radiation oncology as outpatient.  Acute kidney injury Presented with creatinine of 1.43 and has improved.  He was also noted to have electrolyte abnormalities which were corrected.  Renal function back to baseline.  Persistent atrial fibrillation/history of bradycardia status post pacemaker Followed by Dr. Rayann Heman.    Eliquis was placed on hold.  He was initially placed on IV heparin after surgery.  Transition back to his Eliquis at home. Not on rate limiting drugs at home.  Hyperbilirubinemia Likely due to acute illness.  Has improved.  Abdomen is otherwise benign.  No indication for further work-up at this time.    Normocytic anemia/leukopenia Mild drop in hemoglobin likely due to dilution as well as surgery.  No other overt loss noted.    Hypercalcemia Appears to have improved.  Likely due to dehydration.  Prediabetes HbA1c 5.9.  Outpatient monitoring.  GERD On PPI  Hyperlipidemia   Questionable hematuria Outpatient monitoring by urology considering his history of prostate cancer.  Patient is stable.  Cleared by general surgery for discharge.  Okay for discharge today.   PERTINENT LABS:  The results of significant diagnostics from this hospitalization (including imaging, microbiology, ancillary and laboratory) are listed below for reference.    Microbiology: Recent Results (from the past 240 hour(s))  Resp Panel by RT-PCR (Flu A&B, Covid) Nasopharyngeal Swab     Status: None   Collection Time: 06/01/20  3:38 PM   Specimen: Nasopharyngeal Swab; Nasopharyngeal(NP) swabs in vial transport medium  Result Value Ref Range Status   SARS  Coronavirus 2 by  RT PCR NEGATIVE NEGATIVE Final    Comment: (NOTE) SARS-CoV-2 target nucleic acids are NOT DETECTED.  The SARS-CoV-2 RNA is generally detectable in upper respiratory specimens during the acute phase of infection. The lowest concentration of SARS-CoV-2 viral copies this assay can detect is 138 copies/mL. A negative result does not preclude SARS-Cov-2 infection and should not be used as the sole basis for treatment or other patient management decisions. A negative result may occur with  improper specimen collection/handling, submission of specimen other than nasopharyngeal swab, presence of viral mutation(s) within the areas targeted by this assay, and inadequate number of viral copies(<138 copies/mL). A negative result must be combined with clinical observations, patient history, and epidemiological information. The expected result is Negative.  Fact Sheet for Patients:  EntrepreneurPulse.com.au  Fact Sheet for Healthcare Providers:  IncredibleEmployment.be  This test is no t yet approved or cleared by the [place] FDA and  has been authorized for detection and/or diagnosis of SARS-CoV-2 by FDA under an Emergency Use Authorization (EUA). This EUA will remain  in effect (meaning this test can be used) for the duration of the COVID-19 declaration under Section 564(b)(1) of the Act, 21 U.S.C.section 360bbb-3(b)(1), unless the authorization is terminated  or revoked sooner.       Influenza A by PCR NEGATIVE NEGATIVE Final   Influenza B by PCR NEGATIVE NEGATIVE Final    Comment: (NOTE) The Xpert Xpress SARS-CoV-2/FLU/RSV plus assay is intended as an aid in the diagnosis of influenza from Nasopharyngeal swab specimens and should not be used as a sole basis for treatment. Nasal washings and aspirates are unacceptable for Xpert Xpress SARS-CoV-2/FLU/RSV testing.  Fact Sheet for  Patients: EntrepreneurPulse.com.au  Fact Sheet for Healthcare Providers: IncredibleEmployment.be  This test is not yet approved or cleared by the [place] FDA and has been authorized for detection and/or diagnosis of SARS-CoV-2 by FDA under an Emergency Use Authorization (EUA). This EUA will remain in effect (meaning this test can be used) for the duration of the COVID-19 declaration under Section 564(b)(1) of the Act, 21 U.S.C. section 360bbb-3(b)(1), unless the authorization is terminated or revoked.  Performed at [facility]   Culture, blood (routine x 2) Call MD if unable to obtain prior to antibiotics being given     Status: None (Preliminary result)   Collection Time: 06/01/20  6:50 PM   Specimen: BLOOD LEFT WRIST  Result Value Ref Range Status   Specimen Description   Final    BLOOD LEFT WRIST Performed at [facility], [address], [place], [place] [address]    Special Requests   Final    BOTTLES DRAWN AEROBIC AND ANAEROBIC Blood Culture adequate volume Performed at [facility] [address] [place], [place] [address]    Culture   Final    NO GROWTH 4 DAYS Performed at [facility], [place] [address] [place], [place] [address]    Report Status PENDING  Incomplete  Culture, blood (routine x 2) Call MD if unable to obtain prior to antibiotics being given     Status: None (Preliminary result)   Collection Time: 06/01/20  6:56 PM   Specimen: BLOOD  Result Value Ref Range Status   Specimen Description   Final    BLOOD RIGHT ANTECUBITAL Performed at [facility] [address], [place], [place] [address]    Special Requests   Final    BOTTLES DRAWN AEROBIC ONLY Blood Culture adequate volume Performed  at [facility], [place] [address] [place], [place] [address]    Culture   Final    NO GROWTH 4 DAYS Performed at [facility],  [place] [address], [place], [place] [address]    Report Status PENDING  Incomplete     Labs:  COVID-19 Labs   Lab Results  Component Value Date   [place] NEGATIVE 06/01/2020   [place] NEGATIVE 04/09/2020      Basic Metabolic Panel: Recent Labs  Lab 06/02/20 0346 06/03/20 0351 06/04/20 0412 06/05/20 0420 06/06/20 0343  NA 137 139 145 145 141  K 3.9 3.5 3.6 4.0 3.8  CL 96* 101 106 110 109  CO2 29 31 27 25 26   GLUCOSE 121* 119* 113* 152* 116*  BUN 36* 33* 30* 27* 29*  CREATININE 1.57* 1.23 0.87 0.80 0.79  CALCIUM 9.8 10.1 10.5* 9.6 9.2  MG 2.3 2.4 2.1 2.0 1.8  PHOS 4.7* 2.5 1.8* 2.5  --    Liver Function Tests: Recent Labs  Lab 06/01/20 1307 06/02/20 0346 06/03/20 0351 06/04/20 0412 06/05/20 0420  AST 29 33 25 25 31   ALT 21 22 19 19 23   ALKPHOS 70 58 53 60 59  BILITOT 2.0* 2.3* 2.2* 2.6* 1.4*  PROT 6.7 6.1* 5.9* 6.7 5.5*  ALBUMIN 4.4 3.4* 3.2* 3.6 2.8*   Recent Labs  Lab 06/01/20 1307  LIPASE 21   CBC: Recent Labs  Lab 06/01/20 1307 06/02/20 0346 06/03/20 0351 06/04/20 0412 06/05/20 0420 06/06/20 0343  WBC 3.5* 9.6 7.8 9.4 5.1 7.3  NEUTROABS 2.8  --  6.9 8.3* 4.3  --   HGB 14.5 11.8* 10.6* 11.7* 10.7* 9.8*  HCT 43.5 34.9* 32.6* 36.6* 33.3* 30.9*  MCV 92.9 94.1 97.3 98.4 98.5 97.8  PLT 191 154 132* 145* 143* 163    CBG: Recent Labs  Lab 06/02/20 1132 06/03/20 0832 06/04/20 0749 06/05/20 0830 06/06/20 0801  GLUCAP 120* 112* 107* 117* 71     IMAGING STUDIES DG Abd 1 View  Result Date: 06/04/2020 CLINICAL DATA:  Small-bowel obstruction EXAM: ABDOMEN - 1 VIEW COMPARISON:  06/03/2020 FINDINGS: Nasogastric tube tip is seen just beyond the expected gastroesophageal junction within the expected proximal body of the stomach. Multiple gas-filled dilated loops of small bowel are seen within the mid abdomen in keeping with a mid to distal small bowel obstruction. The caliber of dilated small bowel loops appears slightly progressive since prior  examination. Relatively little gas and stool is seen throughout the large bowel, similar to prior examination. No gross free intraperitoneal gas. IMPRESSION: Findings in keeping with a mid to distal small bowel obstruction. Slight interval progressive distension of multiple loops of gas-filled small bowel. Electronically Signed   By: Fidela Salisbury MD   On: 06/04/2020 05:34   DG Abd 1 View  Result Date: 06/01/2020 CLINICAL DATA:  NG tube placement. EXAM: ABDOMEN - 1 VIEW COMPARISON:  06/01/2020 CT and prior studies FINDINGS: An NG tube is noted with tip overlying the proximal stomach and side hole at the esophagogastric junction-recommend advancement. No other changes noted. IMPRESSION: NG tube with tip overlying the proximal stomach and side hole at the esophagogastric junction-recommend advancement. Electronically Signed   By: Margarette Canada M.D.   On: 06/01/2020 16:14   CT Abdomen Pelvis W Contrast  Result Date: 06/01/2020 CLINICAL DATA:  Abdominal pain EXAM: CT ABDOMEN AND PELVIS WITH CONTRAST TECHNIQUE: Multidetector CT imaging of the abdomen and pelvis was performed using the standard protocol following bolus  administration of intravenous contrast. CONTRAST:  174mL OMNIPAQUE IOHEXOL 300 MG/ML  SOLN COMPARISON:  04/09/2020 FINDINGS: Lower chest: Extensive, predominantly ground-glass opacity throughout the visualized right lower lobe. Left lung base is clear. Heart size within normal limits. Fluid distension of the distal esophagus. Hepatobiliary: No focal liver abnormality is seen. No gallstones, gallbladder wall thickening, or biliary dilatation. Pancreas: Unremarkable. No pancreatic ductal dilatation or surrounding inflammatory changes. Spleen: Unchanged probable flash filling hemangioma within the upper pole of the spleen. Spleen otherwise unremarkable. Adrenals/Urinary Tract: Unremarkable adrenal glands. Kidneys enhance symmetrically without focal lesion, stone, or hydronephrosis. Ureters are  nondilated. Urinary bladder appears unremarkable. Stomach/Bowel: High-grade small bowel obstruction with abrupt transition point within the right lower quadrant (series 2, image 59). Small bowel dilated up to 4.1 cm in diameter with air-fluid levels. Stomach and distal esophagus are dilated and fluid-filled. Distal small bowel and colon are decompressed. Extensive colonic diverticulosis. Vascular/Lymphatic: Scattered aortoiliac atherosclerotic calcifications without aneurysm. No abdominopelvic lymphadenopathy. Reproductive: Brachytherapy seeds within the prostate gland. Other: Small volume ascites.  No pneumoperitoneum. Musculoskeletal: No acute or significant osseous findings. Moderate degenerative changes of the bilateral hips. Scattered lumbar spondylosis. IMPRESSION: 1. High-grade small bowel obstruction with abrupt transition point within the right lower quadrant. Surgical evaluation recommended. 2. Small volume ascites, likely reactive. 3. Extensive, predominantly ground-glass opacity throughout the visualized right lower lobe, suspicious for pneumonia. Radiographic follow-up to resolution recommended. 4. Extensive colonic diverticulosis without evidence of acute diverticulitis. 5. Aortic atherosclerosis (ICD10-I70.0). These results were called by telephone at the time of interpretation on 06/01/2020 at 2:29 pm to provider MELANIE BELFI , who verbally acknowledged these results. Electronically Signed   By: Davina Poke D.O.   On: 06/01/2020 14:30   DG CHEST PORT 1 VIEW  Result Date: 06/04/2020 CLINICAL DATA:  Small-bowel obstruction, dyspnea EXAM: PORTABLE CHEST 1 VIEW COMPARISON:  06/01/2020 FINDINGS: Nasogastric tube extends into the upper abdomen beyond the margin of the examination. Lung volumes are small, but are symmetric. Pulmonary insufflation has decreased slightly since prior examination. There is increasing multifocal pulmonary infiltrate throughout the right lung, likely infectious in the  acute setting. Minimal left basilar atelectasis has now developed. No pneumothorax or pleural effusion. Cardiac size is mildly enlarged, unchanged. Left subclavian dual lead pacemaker is unchanged. Pulmonary vascularity is normal. IMPRESSION: Progressive pulmonary hypoinflation. Progressive multifocal pulmonary infiltrate within the right lung, likely infectious. Electronically Signed   By: Fidela Salisbury MD   On: 06/04/2020 05:32   DG CHEST PORT 1 VIEW  Result Date: 06/01/2020 CLINICAL DATA:  Short of breath, cough EXAM: PORTABLE CHEST 1 VIEW COMPARISON:  06/01/2020, 02/17/2017 FINDINGS: Single frontal view of the chest demonstrates enteric catheter passing below diaphragm tip excluded by collimation. Dual lead pacer overlies left chest. Cardiac silhouette is unremarkable. There is persistent right basilar airspace disease unchanged since recent CT. No effusion or pneumothorax. No acute bony abnormalities. IMPRESSION: 1. Persistent right basilar airspace disease consistent with pneumonia. Electronically Signed   By: Randa Ngo M.D.   On: 06/01/2020 18:36   DG Abd Portable 1V  Result Date: 06/03/2020 CLINICAL DATA:  Small-bowel obstruction EXAM: PORTABLE ABDOMEN - 1 VIEW COMPARISON:  06/02/2020 FINDINGS: Nasogastric tube tip overlies the expected mid body of the stomach. Multiple gas-filled mildly dilated loops of small bowel are seen within the mid abdomen, improved in caliber since prior examination. There is gas and stool seen throughout the colon, unchanged from prior examination. The findings are, together, in keeping with changes of a resolving small bowel  obstruction. No gross free intraperitoneal gas. Brachytherapy seeds noted within the prostate gland. IMPRESSION: Nasogastric tube in expected position. Decreasing caliber of dilated loops of small bowel in keeping with resolving small bowel obstruction. Electronically Signed   By: Fidela Salisbury MD   On: 06/03/2020 05:55   DG Abd Portable  1V-Small Bowel Obstruction Protocol-initial, 8 hr delay  Result Date: 06/02/2020 CLINICAL DATA:  Small bowel obstruction EXAM: PORTABLE ABDOMEN - 1 VIEW COMPARISON:  06/01/2020 FINDINGS: Enteric tube terminates in the proximal gastric body. Contrast in the gastric cardia. Multiple dilated loops of small bowel in the central abdomen, compatible with persistent small bowel obstruction. Excretory contrast in the bladder. IMPRESSION: Multiple dilated loops of small bowel in the central abdomen, compatible with persistent small bowel obstruction. Electronically Signed   By: Julian Hy M.D.   On: 06/02/2020 06:30   DG Abd Portable 1V-Small Bowel Protocol-Position Verification  Result Date: 06/01/2020 CLINICAL DATA:  Enteric catheter advancement EXAM: PORTABLE ABDOMEN - 1 VIEW COMPARISON:  06/01/2020 at 4:07 p.m. FINDINGS: Frontal view of the lower chest and upper abdomen demonstrates enteric catheter tip and side port projecting over the gastric antrum. Small-bowel obstruction again noted. Areas of consolidation are seen at the lung bases, right greater than left, stable. IMPRESSION: 1. Enteric catheter tip and side port projecting over gastric antrum. 2. Persistent small bowel obstruction. 3. Persistent bibasilar airspace disease. Electronically Signed   By: Randa Ngo M.D.   On: 06/01/2020 18:34   CUP PACEART INCLINIC DEVICE CHECK  Result Date: 05/16/2020 Pacemaker check in clinic. Normal device function. Thresholds, sensing, impedances consistent with previous measurements. Device programmed to maximize longevity. Device programmed at appropriate safety margins. Unable to obtain an atrial threshold, due to presenting rhythm A-fib. AT/AF burden 93%. Patient has been in A-fib since 05/2019. 3 episodes logged of A-fib with RVR, longest duration less than 1 minute. Per Dr. Rayann Heman, Mode changed to VVIR. Histogram distribution appropriate for patient activity  level. Device programmed to optimize  intrinsic conduction. Estimated longevity 10years, 1 month. Next remote 07/31/2020.Rexene Edison, BSN, RN, EMT-P   DISCHARGE EXAMINATION: Vitals:   06/05/20 1954 06/05/20 2148 06/06/20 0503 06/06/20 1238  BP:  (!) 133/93 113/72 122/82  Pulse:  79 79 69  Resp:  16 15 16   Temp:  98.1 F (36.7 C) 98.5 F (36.9 C) 98.8 F (37.1 C)  TempSrc:  Oral Oral Oral  SpO2: 96% 97% 97% 96%  Weight:      Height:       General appearance: Awake alert.  In no distress Resp: Clear to auscultation bilaterally.  Normal effort Cardio: S1-S2 is normal regular.  No S3-S4.  No rubs murmurs or bruit    DISPOSITION: Home  Discharge Instructions    Call MD for:  difficulty breathing, headache or visual disturbances   Complete by: As directed    Call MD for:  extreme fatigue   Complete by: As directed    Call MD for:  persistant dizziness or light-headedness   Complete by: As directed    Call MD for:  persistant nausea and vomiting   Complete by: As directed    Call MD for:  severe uncontrolled pain   Complete by: As directed    Call MD for:  temperature >100.4   Complete by: As directed    Discharge instructions   Complete by: As directed    Please follow instructions provided by the surgeon.  You were cared for by a hospitalist during your  hospital stay. If you have any questions about your discharge medications or the care you received while you were in the hospital after you are discharged, you can call the unit and asked to speak with the hospitalist on call if the hospitalist that took care of you is not available. Once you are discharged, your primary care physician will handle any further medical issues. Please note that NO REFILLS for any discharge medications will be authorized once you are discharged, as it is imperative that you return to your primary care physician (or establish a relationship with a primary care physician if you do not have one) for your aftercare needs so that they can  reassess your need for medications and monitor your lab values. If you do not have a primary care physician, you can call [phone] for a physician referral.   Increase activity slowly   Complete by: As directed        Allergies as of 06/06/2020      Reactions   Atorvastatin Palpitations   Ezetimibe Palpitations      Medication List    STOP taking these medications   bisacodyl 10 MG/30ML Enem Commonly known as: FLEET     TAKE these medications   acetaminophen 500 MG tablet Commonly known as: TYLENOL Take 500 mg by mouth daily as needed for mild pain.   docusate sodium 100 MG capsule Commonly known as: Colace Take 1 capsule (100 mg total) by mouth 2 (two) times daily.   Dulcolax 400 MG/5ML suspension Generic drug: magnesium hydroxide Take 15 mLs by mouth daily as needed for mild constipation.   Eliquis 5 MG Tabs tablet Generic drug: apixaban TAKE 1 TABLET(5 MG) BY MOUTH TWICE DAILY What changed: See the new instructions.   Eye Vitamins Caps Take 1 capsule by mouth daily.   MAGNESIUM CITRATE PO Take 1 Bottle by mouth daily as needed (constipation).   multivitamin with minerals Tabs tablet Take 1 tablet by mouth daily.   ondansetron 4 MG disintegrating tablet Commonly known as: Zofran ODT Take 1 tablet (4 mg total) by mouth every 4 (four) hours as needed for nausea or vomiting.   OVER THE COUNTER MEDICATION Place 1 suppository rectally daily as needed (constipation). Not sure about name   oxyCODONE-acetaminophen 5-325 MG tablet Commonly known as: Percocet Take 1-2 tablets by mouth every 6 (six) hours as needed for up to 5 days for moderate pain. What changed:   when to take this  reasons to take this   pantoprazole 40 MG tablet Commonly known as: PROTONIX Take 40 mg daily by mouth.   polyethylene glycol 17 g packet Commonly known as: MIRALAX / GLYCOLAX Take 17 g by mouth daily as needed for moderate constipation.   rosuvastatin 10 MG tablet Commonly  known as: CRESTOR Take 10 mg by mouth every morning.   sodium chloride 0.65 % Soln nasal spray Commonly known as: OCEAN Place 1 spray into both nostrils daily.   SYSTANE BALANCE OP Place 1 drop into both eyes daily as needed (dry eyes).   tamsulosin 0.4 MG Caps capsule Commonly known as: Flomax Take 1 capsule (0.4 mg total) by mouth daily after supper.         Follow-up Information    Clovis Riley, MD. Go on 06/27/2020.   Specialty: General Surgery Why: Your appointment is 04/21 at 9:40am Please arrive 30 minutes prior to your appointment to check in and fill out paperwork. Bring photo ID and insurance information. Contact information: [phone] [place] [address] [phone]               TOTAL DISCHARGE TIME: 35 minutes  [facility] Pager on [email or web address]  06/07/2020, 10:57 AM

## 2020-06-06 NOTE — Plan of Care (Signed)

## 2020-06-06 NOTE — Plan of Care (Signed)
  Problem: Nutrition: Goal: Adequate nutrition will be maintained Outcome: Progressing   Problem: Education: Goal: Knowledge of General Education information will improve Description: Including pain rating scale, medication(s)/side effects and non-pharmacologic comfort measures Outcome: Completed/Met   Problem: Activity: Goal: Risk for activity intolerance will decrease Outcome: Completed/Met

## 2020-06-06 NOTE — Care Management Important Message (Signed)
Important Message  Patient Details IM Letter given to the Patient Name: Todd Chapman MRN: [id number] Date of Birth: 1945-08-05   Medicare Important Message Given:  Yes     Kerin Salen 06/06/2020, 11:19 AM

## 2020-06-06 NOTE — Progress Notes (Signed)
[facility] for Heparin Indication: atrial fibrillation  Allergies  Allergen Reactions  . Atorvastatin Palpitations  . Ezetimibe Palpitations    Patient Measurements: Height: 6\' 2"  (188 cm) Weight: 89.9 kg (198 lb 3.1 oz) IBW/kg (Calculated) : 82.2 Heparin Dosing Weight: total weight  Vital Signs: Temp: 98.5 F (36.9 C) (03/31 0503) Temp Source: Oral (03/31 0503) BP: 113/72 (03/31 0503) Pulse Rate: 79 (03/31 0503)  Labs: Recent Labs    06/04/20 0412 06/05/20 0420 06/05/20 2159 06/06/20 0343  HGB 11.7* 10.7*  --  9.8*  HCT 36.6* 33.3*  --  30.9*  PLT 145* 143*  --  163  HEPARINUNFRC  --   --  0.36 0.40  CREATININE 0.87 0.80  --  0.79    Estimated Creatinine Clearance: 89.9 mL/min (by C-G formula based on SCr of 0.79 mg/dL).   Medical History: Past Medical History:  Diagnosis Date  . Arthritis    "thumbs, back" (02/16/2017)  . Bradycardia 02/2017  . Dyspnea    with exertion  . Flu 2 weeks ago  . GERD (gastroesophageal reflux disease)   . Hepatitis A as child  . HLD (hyperlipidemia)   . Persistent atrial fibrillation ([place]) 12/18/2016  . Pneumonia 1999; 2018  . 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 Prostate cancer ([place])     Medications:  Scheduled:  . acetaminophen  1,000 mg Oral Q6H  . Chlorhexidine Gluconate Cloth  6 each Topical Once  . ipratropium  0.5 mg Nebulization BID  . levalbuterol  0.63 mg Nebulization BID  . mouth rinse  15 mL Mouth Rinse BID  . pantoprazole (PROTONIX) IV  40 mg Intravenous QHS   Infusions:  . sodium chloride 75 mL/hr at 06/06/20 0151  . cefoTEtan (CEFOTAN) IV    . heparin 1,400 Units/hr (06/05/20 1832)  . methocarbamol (ROBAXIN) IV     PRN: hydrALAZINE, methocarbamol (ROBAXIN) IV, morphine injection, ondansetron **OR** ondansetron (ZOFRAN) IV, traMADol  Assessment: 75 yo male with history of PAF on apixaban admitted with SBO s/p ex-lap with LOA 3/29. Pharmacy  consulted to dose IV heparin while apixaban on hold.  Last dose of apixaban 3/25 prior to admit.  Last dose of lovenox 40mg  3/29 at 20:56  Today, 06/06/2020:  Confirmatory heparin level remains therapeutic on 1400 units/hr  Hgb slightly lower; Plt stable WNL  No bleeding or infusion issues per RN  Goal of Therapy:  Heparin level 0.3-0.7 units/ml Monitor platelets by anticoagulation protocol: Yes   Plan:   Continue heparin IV infusion at 1400 units/hr  Daily heparin level and CBC  Follow up ability to transition back to apixaban  Monitor for s/s of bleeding or thrombosis  Reuel Boom, PharmD, BCPS [phone] 06/06/2020, 8:10 AM

## 2020-06-06 NOTE — Progress Notes (Signed)
Reviewed all discharge instructions including medications and follow up appointments with pt. Pt and spouse Todd Chapman) verbalized understanding of discharge instructions. Annibelle Brazie, Laurel Dimmer, RN

## 2020-06-06 NOTE — Progress Notes (Signed)
[facility] Progress Note  2 Days Post-Op  Subjective: CC-  Feeling well, hungry. No nausea or pain overnight. Reports bowel movement this morning.   Objective: Vital signs in last 24 hours: Temp:  [98.1 F (36.7 C)-98.6 F (37 C)] 98.5 F (36.9 C) (03/31 0503) Pulse Rate:  [73-89] 79 (03/31 0503) Resp:  [15-20] 15 (03/31 0503) BP: (113-181)/(72-126) 113/72 (03/31 0503) SpO2:  [96 %-99 %] 97 % (03/31 0503) Last BM Date: 05/27/20  Intake/Output from previous day: 03/30 0701 - 03/31 0700 In: 1474.6 [P.O.:360; I.V.:1114.6] Out: 1700 [Urine:1250; Emesis/NG output:450] Intake/Output this shift: No intake/output data recorded.  PE: Gen:  Alert, NAD, pleasant Pulm:  rate and effort normal on room air Abd: Soft, nondistended, minimal TTP over incisions, lap incisions cdi without erythema or drainage Skin: no rashes noted, warm and dry  Lab Results:  Recent Labs    06/05/20 0420 06/06/20 0343  WBC 5.1 7.3  HGB 10.7* 9.8*  HCT 33.3* 30.9*  PLT 143* 163   BMET Recent Labs    06/05/20 0420 06/06/20 0343  NA 145 141  K 4.0 3.8  CL 110 109  CO2 25 26  GLUCOSE 152* 116*  BUN 27* 29*  CREATININE 0.80 0.79  CALCIUM 9.6 9.2   PT/INR No results for input(s): LABPROT, INR in the last 72 hours. CMP     Component Value Date/Time   NA 141 06/06/2020 0343   NA 141 02/08/2017 1417   K 3.8 06/06/2020 0343   CL 109 06/06/2020 0343   CO2 26 06/06/2020 0343   GLUCOSE 116 (H) 06/06/2020 0343   BUN 29 (H) 06/06/2020 0343   BUN 14 02/08/2017 1417   CREATININE 0.79 06/06/2020 0343   CALCIUM 9.2 06/06/2020 0343   PROT 5.5 (L) 06/05/2020 0420   ALBUMIN 2.8 (L) 06/05/2020 0420   AST 31 06/05/2020 0420   ALT 23 06/05/2020 0420   ALKPHOS 59 06/05/2020 0420   BILITOT 1.4 (H) 06/05/2020 0420   GFRNONAA >60 06/06/2020 0343   GFRAA >60 02/17/2017 0517   Lipase     Component Value Date/Time   LIPASE 21 06/01/2020 1307       Studies/Results: No results  found.  Anti-infectives: Anti-infectives (From admission, onward)   Start     Dose/Rate Route Frequency Ordered Stop   06/06/20 0900  cefoTEtan (CEFOTAN) 2 g in sodium chloride 0.9 % 100 mL IVPB        2 g 200 mL/hr over 30 Minutes Intravenous On call to O.R. 06/06/20 0808 06/07/20 0559   06/04/20 1454  ceFAZolin (ANCEF) 2-4 GM/100ML-% IVPB  Status:  Discontinued       Note to Pharmacy: Georgena Spurling   : cabinet override      06/04/20 1454 06/04/20 1457   06/04/20 1351  ceFAZolin (ANCEF) 2-4 GM/100ML-% IVPB  Status:  Discontinued       Note to Pharmacy: Randa Evens  : cabinet override      06/04/20 1351 06/04/20 1457   06/01/20 1930  Ampicillin-Sulbactam (UNASYN) 3 g in sodium chloride 0.9 % 100 mL IVPB        3 g 200 mL/hr over 30 Minutes Intravenous Every 6 hours 06/01/20 1842 06/05/20 2359       Assessment/Plan Hx prostate cancer with radiation Rx 12 days ago AKI/dehydration - improved, Cr 0.80 Persistent Atrial fibrillation  Bradycardia s/p PPM Leukopenia Elevated LFT - improved, 1.4 Hypercalcemia/hyperphosphatemia - resolved Thrombocytopenia - platelets 143 Right lower lobe Pneumonia - on  unasyn. O2 sats stable on RA  SBOsecondary to an adhesive band in the right lower quadrant/ internal hernia S/p Diagnostic laparoscopy, lysis of adhesive band 3/29 Dr. Kae Heller - POD#2 - Hungry and having bowel function. Will try soft diet today.   FEN: stop IVF, advance to soft diet ID: Unasyn 3/26 >> day#6 DVT: Lovenox Foley: none Follow up: Dr. Rachel Moulds- possible discharge this evening if tolerating diet with no issues.   LOS: 5 days    Clovis Riley, [facility] 06/06/2020, 8:23 AM Please see Amion for pager number during day hours 7:00am-4:30pm

## 2020-06-07 LAB — CULTURE, BLOOD (ROUTINE X 2)
Culture: NO GROWTH
Culture: NO GROWTH
Special Requests: ADEQUATE
Special Requests: ADEQUATE

## 2020-06-09 NOTE — Addendum Note (Signed)
Addendum  created 06/09/20 1829 by Janeece Riggers, MD   [facility] Staff edited

## 2020-06-17 ENCOUNTER — Telehealth: Payer: Self-pay

## 2020-06-17 ENCOUNTER — Other Ambulatory Visit: Payer: Self-pay | Admitting: Internal Medicine

## 2020-06-17 ENCOUNTER — Ambulatory Visit
Admission: RE | Admit: 2020-06-17 | Discharge: 2020-06-17 | Disposition: A | Discharge status: Home / Self Care | Payer: Medicare Other | Source: Ambulatory Visit | Attending: Internal Medicine | Admitting: Internal Medicine

## 2020-06-17 DIAGNOSIS — Z950 Presence of cardiac pacemaker: Secondary | ICD-10-CM | POA: Diagnosis not present

## 2020-06-17 DIAGNOSIS — R0609 Other forms of dyspnea: Secondary | ICD-10-CM

## 2020-06-17 DIAGNOSIS — R0600 Dyspnea, unspecified: Secondary | ICD-10-CM

## 2020-06-17 DIAGNOSIS — D6869 Other thrombophilia: Secondary | ICD-10-CM | POA: Diagnosis not present

## 2020-06-17 DIAGNOSIS — J90 Pleural effusion, not elsewhere classified: Secondary | ICD-10-CM | POA: Diagnosis not present

## 2020-06-17 DIAGNOSIS — R609 Edema, unspecified: Secondary | ICD-10-CM | POA: Diagnosis not present

## 2020-06-17 DIAGNOSIS — I4891 Unspecified atrial fibrillation: Secondary | ICD-10-CM | POA: Diagnosis not present

## 2020-06-17 IMAGING — DX DG CHEST 2V
2 series · 2 of 2 positions shown · non-contrast
Comparison: Chest x-ray [DATE].

CLINICAL DATA: 75-year-old male with history of dyspnea on
exertion.

EXAM:
CHEST - 2 VIEW

[dg chest 2 view (1 of 2)]
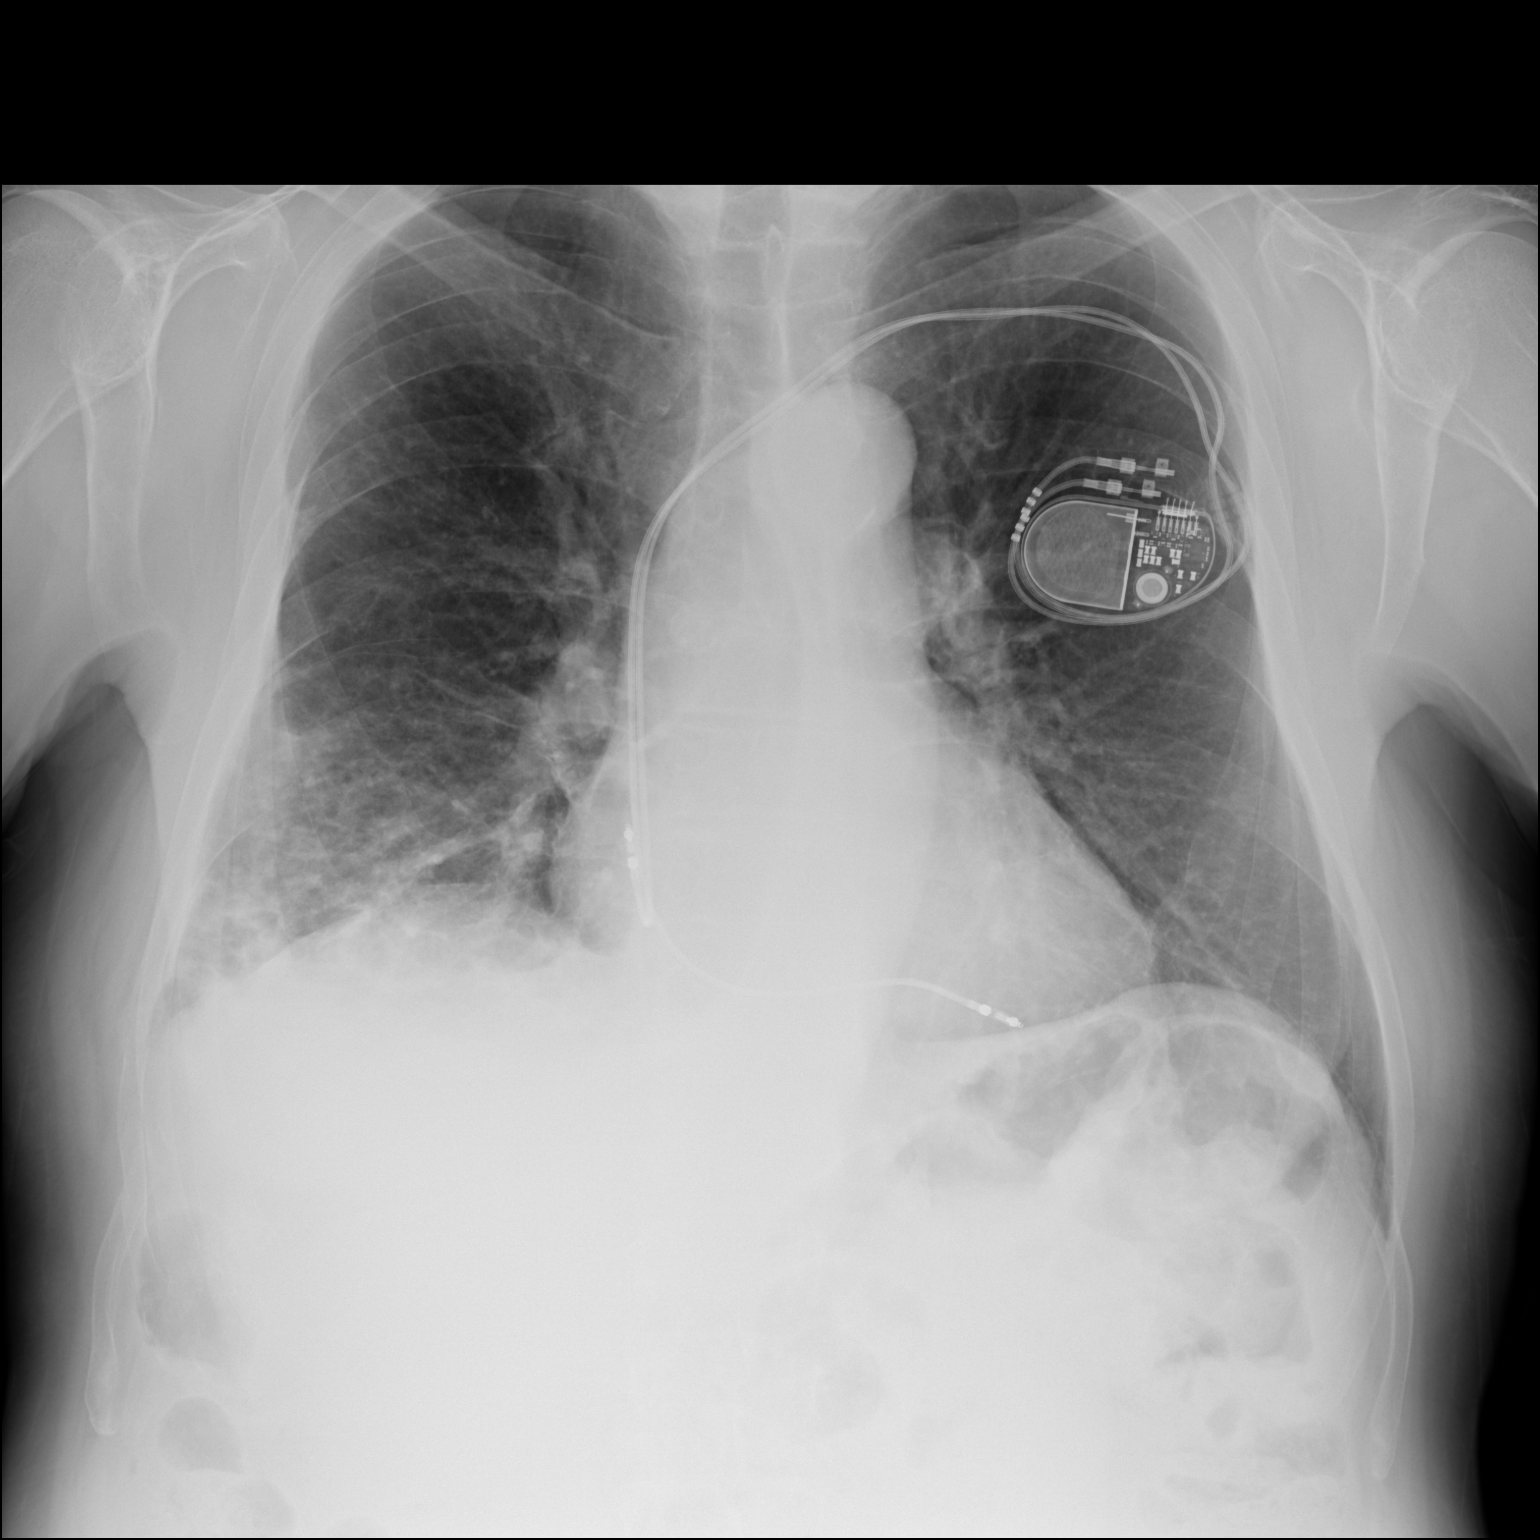

[dg chest 2 view (2 of 2)]
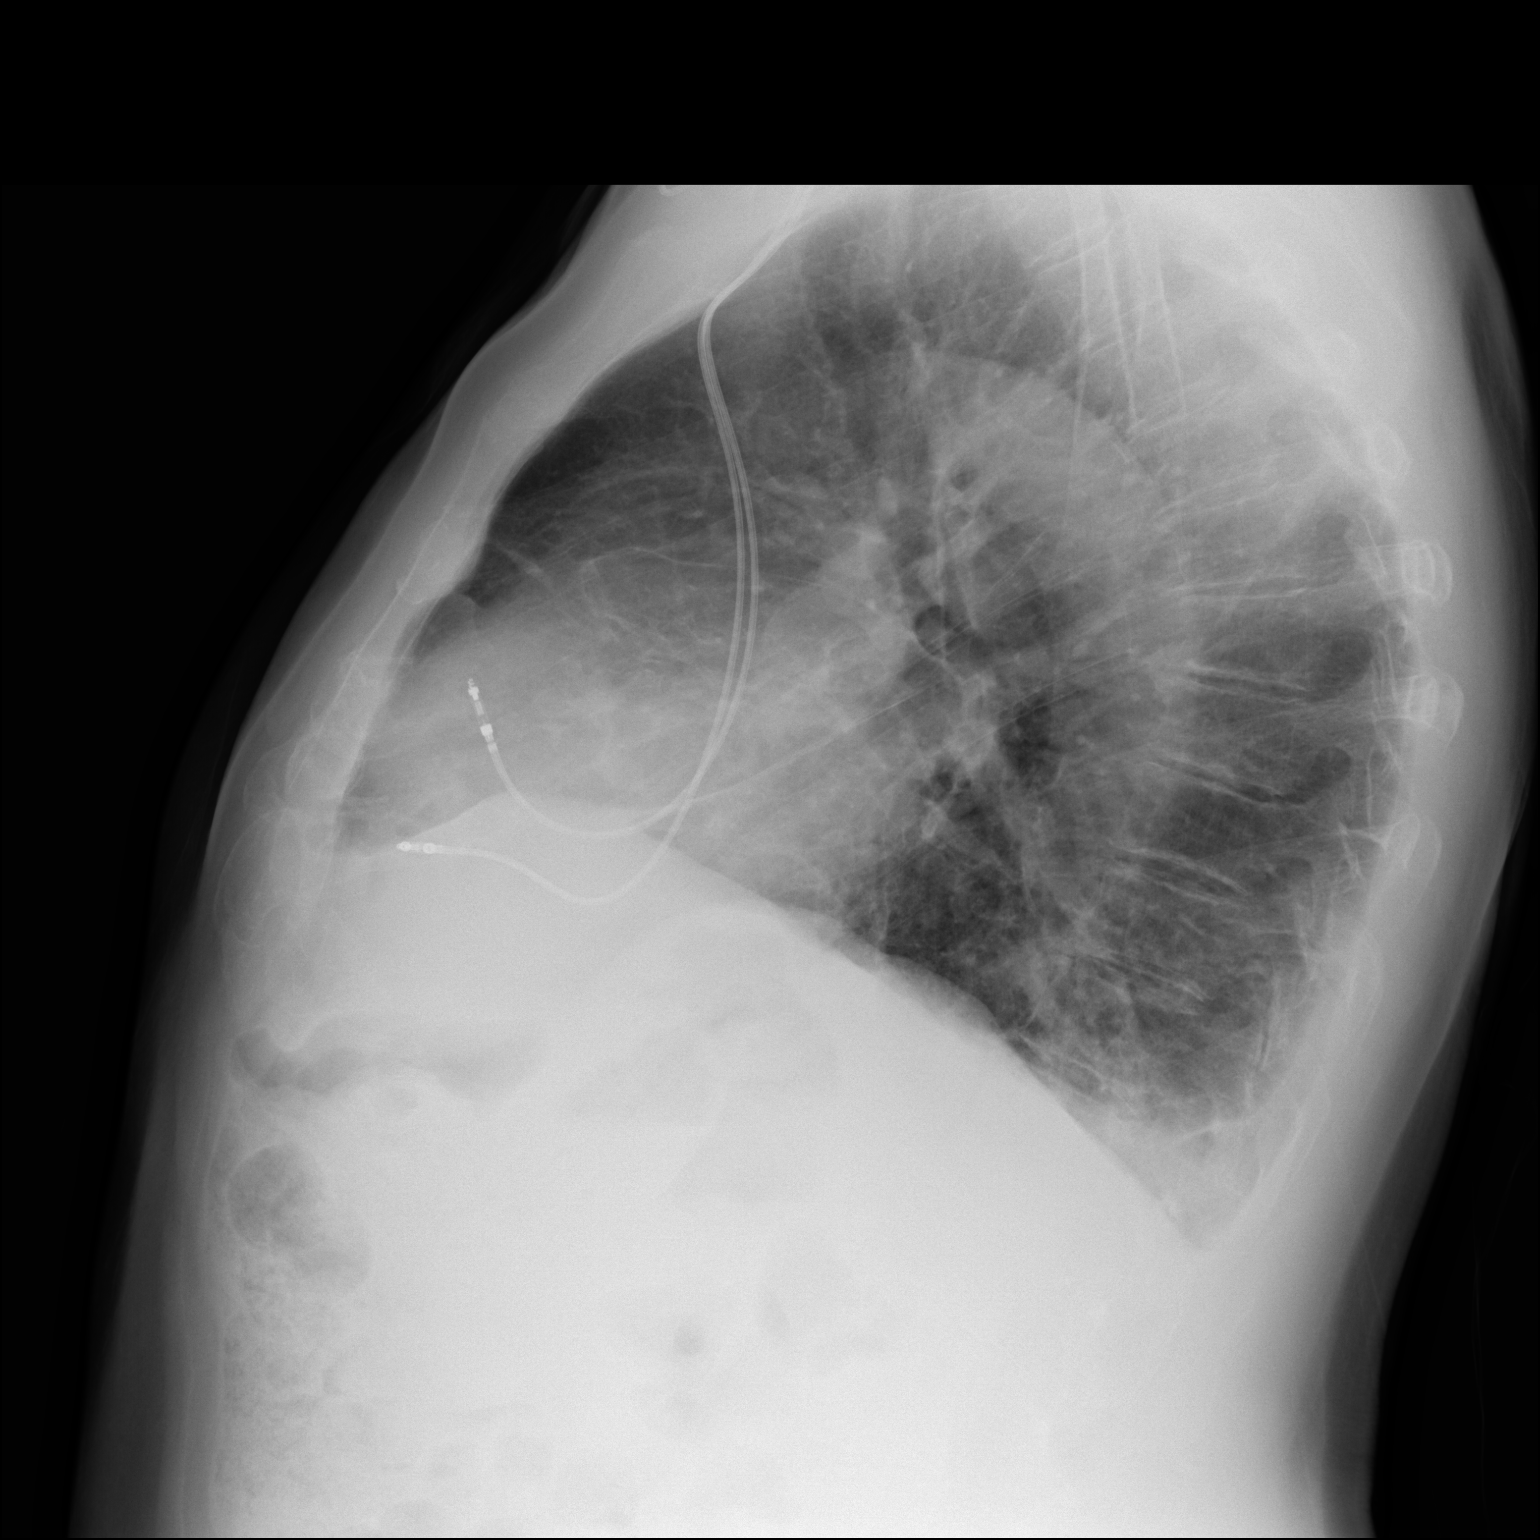

[2 of 2 positions shown; findings below may reference images not displayed]

FINDINGS: Patchy airspace consolidation in the right middle and lower lobes,
slightly improved compared to the prior examination. Left lung is
clear. No pneumothorax. Small right pleural effusion. No left
pleural effusion. No evidence of pulmonary edema. Heart size is
normal. Upper mediastinal contours are within normal limits. Aortic
atherosclerosis. Left-sided pacemaker device in place with lead tips
projecting over the expected location of the right atrium and right
ventricle.
IMPRESSION: 1. Improving multilobar right-sided pneumonia.
2. Small right parapneumonic pleural effusion.
3. Aortic atherosclerosis.

## 2020-06-17 NOTE — Telephone Encounter (Signed)
Spoke with patient in regards to telephone appointment with Freeman Caldron PA on 06/26/20 @ 9:00am. Patient was @ at doctors appointment. Advised that I will call back. Called patient to review meaningful use, AUA and prostate questions. TM

## 2020-06-21 ENCOUNTER — Telehealth: Payer: Self-pay

## 2020-06-21 NOTE — Telephone Encounter (Signed)
Left patient a voicemail message to call back in regards to telephone appointment with Freeman Caldron PA on 06/26/20 @ 9:00am. Called to review meaningful use, AUA and prostates questions. TM

## 2020-06-24 ENCOUNTER — Encounter: Payer: Self-pay | Admitting: Urology

## 2020-06-24 ENCOUNTER — Telehealth: Payer: Self-pay

## 2020-06-24 NOTE — Progress Notes (Signed)
Patient has telephone visit with Ashlyn Bruning PA. Patient states that he is in recovery and doing well from having a small bowel obstruction. Patient states nocturia 4 times per night. Patient denies dysuria. Patient states having loose bowel movements. Patient states emptying his bladder completely most of the time. Patient states urgency and is able to hold urine. Patient denies having to push or strain during urination. Patient states moderate fatigue. Patient denies leakage. Patient states having a metallic taste in his mouth after radiation treatment. Patient is taking Flomax. Patient states he will make a follow-up appointment with [facility].

## 2020-06-24 NOTE — Telephone Encounter (Signed)
Spoke with patient in regards to telephone visit with Freeman Caldron PA on 06/26/20 @ 9:00am. Patient verbalized understanding of appointment date and time. Reviewed meaningful use, AUA and prostate questions.

## 2020-06-25 NOTE — Progress Notes (Signed)
  Radiation Oncology         [phone] ________________________________  Name: Todd Chapman MRN: [id number]  Date: 05/20/2020  DOB: November 01, 1945  End of Treatment Note  Diagnosis:   75 y.o. gentleman with Stage T1c adenocarcinoma of the prostate with Gleason score of 4+5, and PSA of 5.9.     Indication for treatment:  Curative, Definitive Radiotherapy       Radiation treatment dates:   03/20/20 - 05/20/20  Site/dose:   The prostate and pelvic lymph nodes were treated to 45 Gy in 25 fractions of 1.8 Gy, followed by a boost to the prostate to a total dose of 75 Gy with 15 additional fractions of 2 Gy.  Beams/energy:    1. The prostate, seminal vesicles, and pelvic lymph nodes were initially treated using VMAT intensity modulated radiotherapy delivering 6 megavolt photons. Image guidance was performed with CB-CT studies prior to each fraction. He was immobilized with a body fix lower extremity mold.  2. the prostate only was boosted using VMAT intensity modulated radiotherapy delivering 6 megavolt photons. Image guidance was performed with CB-CT studies prior to each fraction. He was immobilized with a body fix lower extremity mold.  Narrative: The patient tolerated radiation treatment relatively well with only minor urinary irritation and modest fatigue.  His treatment course was briefly interrupted by a bout of enteritis in 04/2020, after having received 12 of 25 planned radiation treatments to the pelvic nodes and prostate, to be followed by 15 'boost' treatments to the prostate only.  He was treated with antibiotics and this resulted in a delay of several days of treatment to allow for bowel recovery. Upon resuming treatment, the decision was made to proeced with the smaller radiation fields covering the prostate only, for the 'boost phase' of treatment for 15 treatments.  On completion of the boost, he was able to complete the pelvic nodal irradiation requiring 13 more treatments which he  tolerated well.  Plan: The patient has completed radiation treatment. He will return to radiation oncology clinic for routine followup in one month. I advised him to call or return sooner if he has any questions or concerns related to his recovery or treatment. ________________________________  Sheral Apley. Tammi Klippel, M.D.

## 2020-06-26 ENCOUNTER — Ambulatory Visit
Admission: RE | Admit: 2020-06-26 | Discharge: 2020-06-26 | Disposition: A | Discharge status: Home / Self Care | Payer: Medicare Other | Source: Ambulatory Visit | Attending: Urology | Admitting: Urology

## 2020-06-26 ENCOUNTER — Other Ambulatory Visit: Payer: Self-pay

## 2020-06-26 DIAGNOSIS — C61 Malignant neoplasm of prostate: Secondary | ICD-10-CM

## 2020-06-27 DIAGNOSIS — R609 Edema, unspecified: Secondary | ICD-10-CM | POA: Diagnosis not present

## 2020-06-27 DIAGNOSIS — I4891 Unspecified atrial fibrillation: Secondary | ICD-10-CM | POA: Diagnosis not present

## 2020-06-27 DIAGNOSIS — Z950 Presence of cardiac pacemaker: Secondary | ICD-10-CM | POA: Diagnosis not present

## 2020-06-27 DIAGNOSIS — R0609 Other forms of dyspnea: Secondary | ICD-10-CM | POA: Diagnosis not present

## 2020-06-27 DIAGNOSIS — D6869 Other thrombophilia: Secondary | ICD-10-CM | POA: Diagnosis not present

## 2020-06-28 DIAGNOSIS — M461 Sacroiliitis, not elsewhere classified: Secondary | ICD-10-CM | POA: Diagnosis not present

## 2020-06-28 NOTE — Progress Notes (Signed)
Radiation Oncology         ([phone]) [phone] ________________________________  Name: Todd Chapman MRN: [id number]  Date: 06/26/2020  DOB: 08/20/1945  Post Treatment Note  CC: [name], MD  [name], MD  Diagnosis:   75 y.o. gentleman with Stage T1c adenocarcinoma of the prostate with Gleason score of 4+5, and PSA of 5.9.   Interval Since Last Radiation:  5.5 weeks  03/20/20 - 05/20/20:   The prostate and pelvic lymph nodes were treated to 45 Gy in 25 fractions of 1.8 Gy, followed by a boost to the prostate to a total dose of 75 Gy with 15 additional fractions of 2 Gy. (Concurrent with ADT started 01/19/20)  Narrative: I spoke with the patient to conduct his routine scheduled 1 month follow up visit via telephone to spare the patient unnecessary potential exposure in the healthcare setting during the current COVID-19 pandemic.  The patient was notified in advance and gave permission to proceed with this visit format.  He tolerated radiation treatment relatively well with only minor urinary irritation and modest fatigue.  His treatment course was briefly interrupted by a bout of enteritis in 04/2020, after having received 12 of 25 planned radiation treatments to the pelvic nodes and prostate, to be followed by 15 'boost' treatments to the prostate only.  He was treated with antibiotics and this resulted in a delay of several days of treatment to allow for bowel recovery. Upon resuming treatment, the decision was made to proceed with the smaller radiation fields covering the prostate only, for the 'boost phase' of treatment for 15 treatments. On completion of the boost, he was able to complete the pelvic nodal irradiation requiring 13 more treatments which he tolerated well.                              On review of systems, the patient states that he is doing well in general.  He continues with increased frequency, urgency, nocturia x4 and weak flow of stream despite taking Flomax daily  as prescribed.  However, he does add that he is taking a diuretic daily since the time of his recent hospitalization which may contribute to some of the increased frequency.  He specifically denies dysuria, gross hematuria, straining to void, incomplete emptying or incontinence.  Unfortunately, approximately 2 weeks after completion of his radiotherapy, he required hospital admission on 06/01/2020 due to severe nausea, vomiting, abdominal pain and constipation.  He was diagnosed with small bowel obstruction and did not respond to conservative treatment so he had to be treated surgically with an exploratory laparoscopy for lysis of adhesions, felt likely secondary to previous appendectomy, with Dr. Windle Guard on 06/04/2020 and was able to discharge home on 06/06/2020.  He reports significant improvement in his bowel issues since returning home and continues to recover gradually.  He does have residual fatigue but feels that this is gradually improving as well.  Overall, he is pleased with his progress to date.  ALLERGIES:  is allergic to atorvastatin and ezetimibe.  Meds: Current Outpatient Medications  Medication Sig Dispense Refill  . acetaminophen (TYLENOL) 500 MG tablet Take 500 mg by mouth daily as needed for mild pain.    Marland Kitchen ELIQUIS 5 MG TABS tablet TAKE 1 TABLET(5 MG) BY MOUTH TWICE DAILY (Patient taking differently: Take 5 mg by mouth 2 (two) times daily.) 180 tablet 1  . Multiple Vitamin (MULTIVITAMIN WITH MINERALS) TABS tablet Take 1 tablet  by mouth daily.    . Multiple Vitamins-Minerals (EYE VITAMINS) CAPS Take 1 capsule by mouth daily.     . ondansetron (ZOFRAN ODT) 4 MG disintegrating tablet Take 1 tablet (4 mg total) by mouth every 4 (four) hours as needed for nausea or vomiting. 20 tablet 0  . pantoprazole (PROTONIX) 40 MG tablet Take 40 mg daily by mouth.  2  . Propylene Glycol (SYSTANE BALANCE OP) Place 1 drop into both eyes daily as needed (dry eyes).    . rosuvastatin (CRESTOR) 10 MG tablet  Take 10 mg by mouth every morning.    . sodium chloride (OCEAN) 0.65 % SOLN nasal spray Place 1 spray into both nostrils daily.    . tamsulosin (FLOMAX) 0.4 MG CAPS capsule Take 1 capsule (0.4 mg total) by mouth daily after supper. 30 capsule 5  . docusate sodium (COLACE) 100 MG capsule Take 1 capsule (100 mg total) by mouth 2 (two) times daily. (Patient not taking: Reported on 06/24/2020) 60 capsule 0  . MAGNESIUM CITRATE PO Take 1 Bottle by mouth daily as needed (constipation). (Patient not taking: Reported on 06/24/2020)    . magnesium hydroxide (DULCOLAX) 400 MG/5ML suspension Take 15 mLs by mouth daily as needed for mild constipation. (Patient not taking: Reported on 06/24/2020)    . OVER THE COUNTER MEDICATION Place 1 suppository rectally daily as needed (constipation). Not sure about name (Patient not taking: Reported on 06/24/2020)    . polyethylene glycol (MIRALAX / GLYCOLAX) 17 g packet Take 17 g by mouth daily as needed for moderate constipation. (Patient not taking: Reported on 06/24/2020) 30 each 0   No current facility-administered medications for this encounter.    Physical Findings:  vitals were not taken for this visit.   /Unable to assess due to telephone follow-up visit format.  Lab Findings: Lab Results  Component Value Date   WBC 7.3 06/06/2020   HGB 9.8 (L) 06/06/2020   HCT 30.9 (L) 06/06/2020   MCV 97.8 06/06/2020   PLT 163 06/06/2020     Radiographic Findings: DG Chest 2 View  Result Date: 06/17/2020 CLINICAL DATA:  75 year old male with history of dyspnea on exertion. EXAM: CHEST - 2 VIEW COMPARISON:  Chest x-ray 06/04/2020. FINDINGS: Patchy airspace consolidation in the right middle and lower lobes, slightly improved compared to the prior examination. Left lung is clear. No pneumothorax. Small right pleural effusion. No left pleural effusion. No evidence of pulmonary edema. Heart size is normal. Upper mediastinal contours are within normal limits. Aortic  atherosclerosis. 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 IMPRESSION: 1. Improving multilobar right-sided pneumonia. 2. Small right parapneumonic pleural effusion. 3. Aortic atherosclerosis. Electronically Signed   By: Vinnie Langton M.D.   On: 06/17/2020 18:25   DG Abd 1 View  Result Date: 06/04/2020 CLINICAL DATA:  Small-bowel obstruction EXAM: ABDOMEN - 1 VIEW COMPARISON:  06/03/2020 FINDINGS: Nasogastric tube tip is seen just beyond the expected gastroesophageal junction within the expected proximal body of the stomach. Multiple gas-filled dilated loops of small bowel are seen within the mid abdomen in keeping with a mid to distal small bowel obstruction. The caliber of dilated small bowel loops appears slightly progressive since prior examination. Relatively little gas and stool is seen throughout the large bowel, similar to prior examination. No gross free intraperitoneal gas. IMPRESSION: Findings in keeping with a mid to distal small bowel obstruction. Slight interval progressive distension of multiple loops  of gas-filled small bowel. Electronically Signed   By: Fidela Salisbury MD   On: 06/04/2020 05:34   DG Abd 1 View  Result Date: 06/01/2020 CLINICAL DATA:  NG tube placement. EXAM: ABDOMEN - 1 VIEW COMPARISON:  06/01/2020 CT and prior studies FINDINGS: An NG tube is noted with tip overlying the proximal stomach and side hole at the esophagogastric junction-recommend advancement. No other changes noted. IMPRESSION: NG tube with tip overlying the proximal stomach and side hole at the esophagogastric junction-recommend advancement. Electronically Signed   By: Margarette Canada M.D.   On: 06/01/2020 16:14   CT Abdomen Pelvis W Contrast  Result Date: 06/01/2020 CLINICAL DATA:  Abdominal pain EXAM: CT ABDOMEN AND PELVIS WITH CONTRAST TECHNIQUE: Multidetector CT imaging of the abdomen and pelvis was performed using the  standard protocol following bolus administration of intravenous contrast. CONTRAST:  1101mL OMNIPAQUE IOHEXOL 300 MG/ML  SOLN COMPARISON:  04/09/2020 FINDINGS: Lower chest: Extensive, predominantly ground-glass opacity throughout the visualized right lower lobe. Left lung base is clear. Heart size within normal limits. Fluid distension of the distal esophagus. Hepatobiliary: No focal liver abnormality is seen. No gallstones, gallbladder wall thickening, or biliary dilatation. Pancreas: Unremarkable. No pancreatic ductal dilatation or surrounding inflammatory changes. Spleen: Unchanged probable flash filling hemangioma within the upper pole of the spleen. Spleen otherwise unremarkable. Adrenals/Urinary Tract: Unremarkable adrenal glands. Kidneys enhance symmetrically without focal lesion, stone, or hydronephrosis. Ureters are nondilated. Urinary bladder appears unremarkable. Stomach/Bowel: High-grade small bowel obstruction with abrupt transition point within the right lower quadrant (series 2, image 59). Small bowel dilated up to 4.1 cm in diameter with air-fluid levels. Stomach and distal esophagus are dilated and fluid-filled. Distal small bowel and colon are decompressed. Extensive colonic diverticulosis. Vascular/Lymphatic: Scattered aortoiliac atherosclerotic calcifications without aneurysm. No abdominopelvic lymphadenopathy. Reproductive: Brachytherapy seeds within the prostate gland. Other: Small volume ascites.  No pneumoperitoneum. Musculoskeletal: No acute or significant osseous findings. Moderate degenerative changes of the bilateral hips. Scattered lumbar spondylosis. IMPRESSION: 1. High-grade small bowel obstruction with abrupt transition point within the right lower quadrant. Surgical evaluation recommended. 2. Small volume ascites, likely reactive. 3. Extensive, predominantly ground-glass opacity throughout the visualized right lower lobe, suspicious for pneumonia. Radiographic follow-up to resolution  recommended. 4. Extensive colonic diverticulosis without evidence of acute diverticulitis. 5. Aortic atherosclerosis (ICD10-I70.0). These results were called by telephone at the time of interpretation on 06/01/2020 at 2:29 pm to provider MELANIE BELFI , who verbally acknowledged these results. Electronically Signed   By: Davina Poke D.O.   On: 06/01/2020 14:30   DG CHEST PORT 1 VIEW  Result Date: 06/04/2020 CLINICAL DATA:  Small-bowel obstruction, dyspnea EXAM: PORTABLE CHEST 1 VIEW COMPARISON:  06/01/2020 FINDINGS: Nasogastric tube extends into the upper abdomen beyond the margin of the examination. Lung volumes are small, but are symmetric. Pulmonary insufflation has decreased slightly since prior examination. There is increasing multifocal pulmonary infiltrate throughout the right lung, likely infectious in the acute setting. Minimal left basilar atelectasis has now developed. No pneumothorax or pleural effusion. Cardiac size is mildly enlarged, unchanged. Left subclavian dual lead pacemaker is unchanged. Pulmonary vascularity is normal. IMPRESSION: Progressive pulmonary hypoinflation. Progressive multifocal pulmonary infiltrate within the right lung, likely infectious. Electronically Signed   By: Fidela Salisbury MD   On: 06/04/2020 05:32   DG CHEST PORT 1 VIEW  Result Date: 06/01/2020 CLINICAL DATA:  Short of breath, cough EXAM: PORTABLE CHEST 1 VIEW COMPARISON:  06/01/2020, 02/17/2017 FINDINGS: Single frontal view of the chest demonstrates enteric catheter passing  below diaphragm tip excluded by collimation. Dual lead pacer overlies left chest. Cardiac silhouette is unremarkable. There is persistent right basilar airspace disease unchanged since recent CT. No effusion or pneumothorax. No acute bony abnormalities. IMPRESSION: 1. Persistent right basilar airspace disease consistent with pneumonia. Electronically Signed   By: Randa Ngo M.D.   On: 06/01/2020 18:36   DG Abd Portable 1V  Result  Date: 06/03/2020 CLINICAL DATA:  Small-bowel obstruction EXAM: PORTABLE ABDOMEN - 1 VIEW COMPARISON:  06/02/2020 FINDINGS: Nasogastric tube tip overlies the expected mid body of the stomach. Multiple gas-filled mildly dilated loops of small bowel are seen within the mid abdomen, improved in caliber since prior examination. There is gas and stool seen throughout the colon, unchanged from prior examination. The findings are, together, in keeping with changes of a resolving small bowel obstruction. No gross free intraperitoneal gas. Brachytherapy seeds noted within the prostate gland. IMPRESSION: Nasogastric tube in expected position. Decreasing caliber of dilated loops of small bowel in keeping with resolving small bowel obstruction. Electronically Signed   By: Fidela Salisbury MD   On: 06/03/2020 05:55   DG Abd Portable 1V-Small Bowel Obstruction Protocol-initial, 8 hr delay  Result Date: 06/02/2020 CLINICAL DATA:  Small bowel obstruction EXAM: PORTABLE ABDOMEN - 1 VIEW COMPARISON:  06/01/2020 FINDINGS: Enteric tube terminates in the proximal gastric body. Contrast in the gastric cardia. Multiple dilated loops of small bowel in the central abdomen, compatible with persistent small bowel obstruction. Excretory contrast in the bladder. IMPRESSION: Multiple dilated loops of small bowel in the central abdomen, compatible with persistent small bowel obstruction. Electronically Signed   By: Julian Hy M.D.   On: 06/02/2020 06:30   DG Abd Portable 1V-Small Bowel Protocol-Position Verification  Result Date: 06/01/2020 CLINICAL DATA:  Enteric catheter advancement EXAM: PORTABLE ABDOMEN - 1 VIEW COMPARISON:  06/01/2020 at 4:07 p.m. FINDINGS: Frontal view of the lower chest and upper abdomen demonstrates enteric catheter tip and side port projecting over the gastric antrum. Small-bowel obstruction again noted. Areas of consolidation are seen at the lung bases, right greater than left, stable. IMPRESSION: 1. Enteric  catheter tip and side port projecting over gastric antrum. 2. Persistent small bowel obstruction. 3. Persistent bibasilar airspace disease. Electronically Signed   By: Randa Ngo M.D.   On: 06/01/2020 18:34    Impression/Plan: 1. 75 y.o. gentleman with Stage T1c adenocarcinoma of the prostate with Gleason score of 4+5, and PSA of 5.9.  He will continue to follow up with urology for ongoing PSA determinations and has already contacted [facility] to schedule a follow-up appointment with Dr. Diona Fanti in the near future since he will be due for a repeat hormone injection in May 2022. He understands what to expect with regards to PSA monitoring going forward. I will look forward to following his response to treatment via correspondence with urology, and would be happy to continue to participate in his care if clinically indicated. I talked to the patient about what to expect in the future, including his risk for erectile dysfunction and rectal bleeding. I encouraged him to call or return to the office if he has any questions regarding his previous radiation or possible radiation side effects. He was comfortable with this plan and will follow up as needed.    Nicholos Johns, PA-C

## 2020-07-12 DIAGNOSIS — R351 Nocturia: Secondary | ICD-10-CM | POA: Diagnosis not present

## 2020-07-12 DIAGNOSIS — R3912 Poor urinary stream: Secondary | ICD-10-CM | POA: Diagnosis not present

## 2020-07-12 DIAGNOSIS — N401 Enlarged prostate with lower urinary tract symptoms: Secondary | ICD-10-CM | POA: Diagnosis not present

## 2020-07-12 DIAGNOSIS — C61 Malignant neoplasm of prostate: Secondary | ICD-10-CM | POA: Diagnosis not present

## 2020-07-17 ENCOUNTER — Telehealth: Payer: Self-pay

## 2020-07-17 NOTE — Telephone Encounter (Signed)
Detailed instructions left on the patient's answering machine. Asked to call back with any questions. S.Agastya Meister EMTP 

## 2020-07-18 ENCOUNTER — Ambulatory Visit (HOSPITAL_COMMUNITY): Payer: Medicare Other | Attending: Cardiology

## 2020-07-18 ENCOUNTER — Other Ambulatory Visit: Payer: Self-pay

## 2020-07-18 ENCOUNTER — Ambulatory Visit (HOSPITAL_BASED_OUTPATIENT_CLINIC_OR_DEPARTMENT_OTHER): Payer: Medicare Other

## 2020-07-18 DIAGNOSIS — R001 Bradycardia, unspecified: Secondary | ICD-10-CM | POA: Diagnosis not present

## 2020-07-18 DIAGNOSIS — Z950 Presence of cardiac pacemaker: Secondary | ICD-10-CM | POA: Insufficient documentation

## 2020-07-18 DIAGNOSIS — I4821 Permanent atrial fibrillation: Secondary | ICD-10-CM

## 2020-07-18 DIAGNOSIS — R0602 Shortness of breath: Secondary | ICD-10-CM | POA: Insufficient documentation

## 2020-07-18 LAB — MYOCARDIAL PERFUSION IMAGING
LV dias vol: 124 mL (ref 62–150)
LV sys vol: 38 mL
Peak HR: 76 {beats}/min
Rest HR: 61 {beats}/min
SDS: 8
SRS: 1
SSS: 9
TID: 0.95

## 2020-07-18 LAB — ECHOCARDIOGRAM COMPLETE
Height: 74 in
MV M vel: 5.03 m/s
MV Peak grad: 101 mmHg
S' Lateral: 3.1 cm
Weight: 3168 oz

## 2020-07-18 MED ORDER — TECHNETIUM TC 99M TETROFOSMIN IV KIT
10.6000 | PACK | Freq: Once | INTRAVENOUS | Status: AC | PRN
  Administered 2020-07-18: 10.6 via INTRAVENOUS
  Filled 2020-07-18: qty 11

## 2020-07-18 MED ORDER — REGADENOSON 0.4 MG/5ML IV SOLN
0.4000 mg | Freq: Once | INTRAVENOUS | Status: AC
Start: 2020-07-18 — End: 2020-07-18
  Administered 2020-07-18: 0.4 mg via INTRAVENOUS

## 2020-07-18 MED ORDER — TECHNETIUM TC 99M TETROFOSMIN IV KIT
30.4000 | PACK | Freq: Once | INTRAVENOUS | Status: AC | PRN
  Administered 2020-07-18: 30.4 via INTRAVENOUS
  Filled 2020-07-18: qty 31

## 2020-07-31 ENCOUNTER — Ambulatory Visit (INDEPENDENT_AMBULATORY_CARE_PROVIDER_SITE_OTHER): Payer: Medicare Other

## 2020-07-31 DIAGNOSIS — I495 Sick sinus syndrome: Secondary | ICD-10-CM | POA: Diagnosis not present

## 2020-08-01 LAB — CUP PACEART REMOTE DEVICE CHECK
Battery Remaining Longevity: 123 mo
Battery Remaining Percentage: 95.5 %
Battery Voltage: 3.01 V
Brady Statistic RV Percent Paced: 38 %
Date Time Interrogation Session: 20220525020014
Implantable Lead Implant Date: 20181211
Implantable Lead Implant Date: 20181211
Implantable Lead Location: 753859
Implantable Lead Location: 753860
Implantable Pulse Generator Implant Date: 20181211
Lead Channel Impedance Value: 390 Ohm
Lead Channel Pacing Threshold Amplitude: 0.75 V
Lead Channel Pacing Threshold Pulse Width: 0.5 ms
Lead Channel Sensing Intrinsic Amplitude: 10.3 mV
Lead Channel Setting Pacing Amplitude: 2.5 V
Lead Channel Setting Pacing Pulse Width: 0.5 ms
Lead Channel Setting Sensing Sensitivity: 2 mV
Pulse Gen Model: 2272
Pulse Gen Serial Number: 8967264

## 2020-08-13 ENCOUNTER — Other Ambulatory Visit: Payer: Self-pay | Admitting: Internal Medicine

## 2020-08-13 ENCOUNTER — Ambulatory Visit
Admission: RE | Admit: 2020-08-13 | Discharge: 2020-08-13 | Disposition: A | Discharge status: Home / Self Care | Payer: Medicare Other | Source: Ambulatory Visit | Attending: Internal Medicine | Admitting: Internal Medicine

## 2020-08-13 DIAGNOSIS — R0609 Other forms of dyspnea: Secondary | ICD-10-CM | POA: Diagnosis not present

## 2020-08-13 DIAGNOSIS — J90 Pleural effusion, not elsewhere classified: Secondary | ICD-10-CM

## 2020-08-13 DIAGNOSIS — D6869 Other thrombophilia: Secondary | ICD-10-CM | POA: Diagnosis not present

## 2020-08-13 DIAGNOSIS — I4891 Unspecified atrial fibrillation: Secondary | ICD-10-CM | POA: Diagnosis not present

## 2020-08-13 DIAGNOSIS — R609 Edema, unspecified: Secondary | ICD-10-CM | POA: Diagnosis not present

## 2020-08-13 DIAGNOSIS — C61 Malignant neoplasm of prostate: Secondary | ICD-10-CM | POA: Diagnosis not present

## 2020-08-13 DIAGNOSIS — M461 Sacroiliitis, not elsewhere classified: Secondary | ICD-10-CM | POA: Diagnosis not present

## 2020-08-13 DIAGNOSIS — R0602 Shortness of breath: Secondary | ICD-10-CM | POA: Diagnosis not present

## 2020-08-13 DIAGNOSIS — Z950 Presence of cardiac pacemaker: Secondary | ICD-10-CM | POA: Diagnosis not present

## 2020-08-13 IMAGING — DX DG CHEST 2V
2 series · 2 of 2 positions shown · non-contrast
Comparison: [DATE]

CLINICAL DATA: Shortness of breath.

EXAM:
CHEST - 2 VIEW

[dg chest 2 view (1 of 2)]
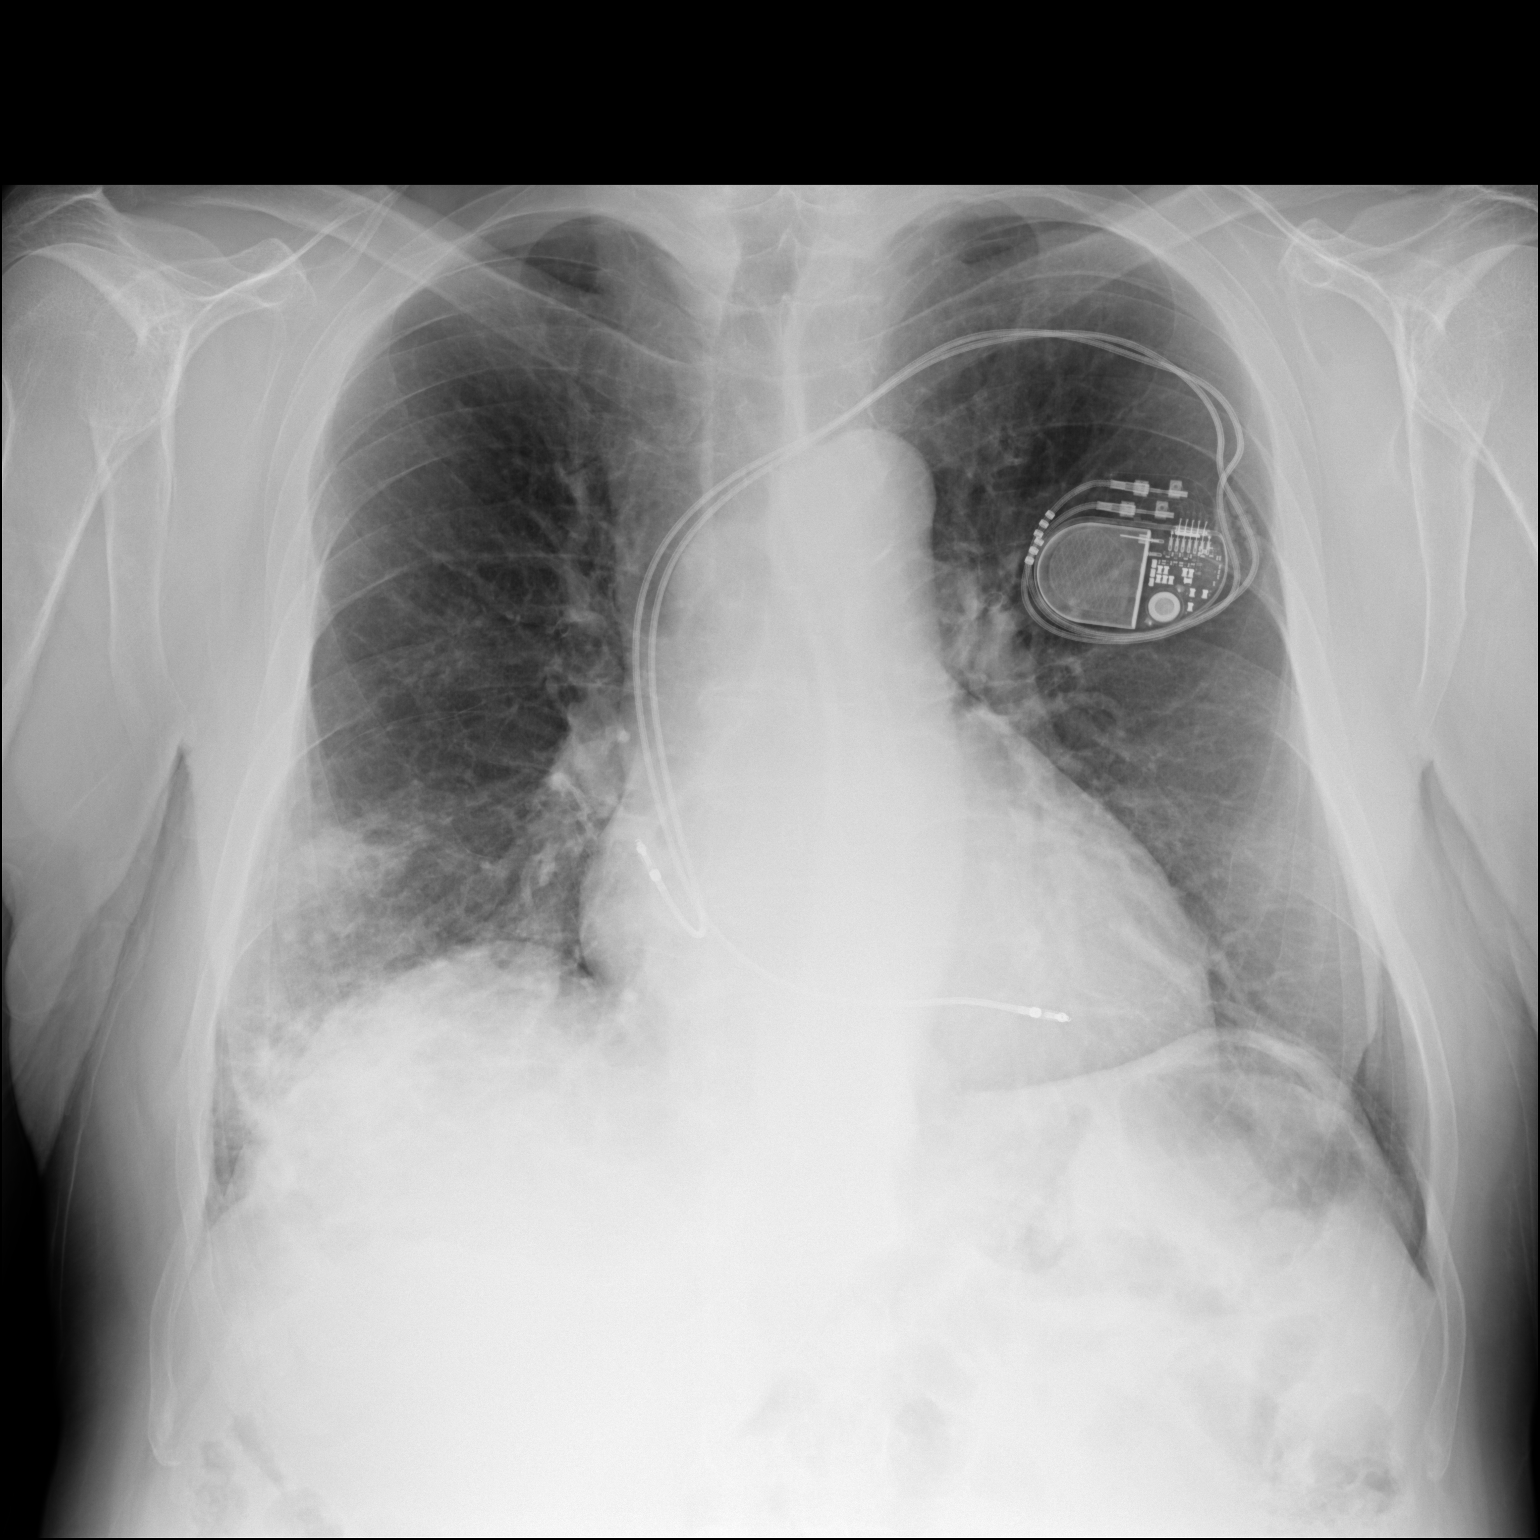

[dg chest 2 view (2 of 2)]
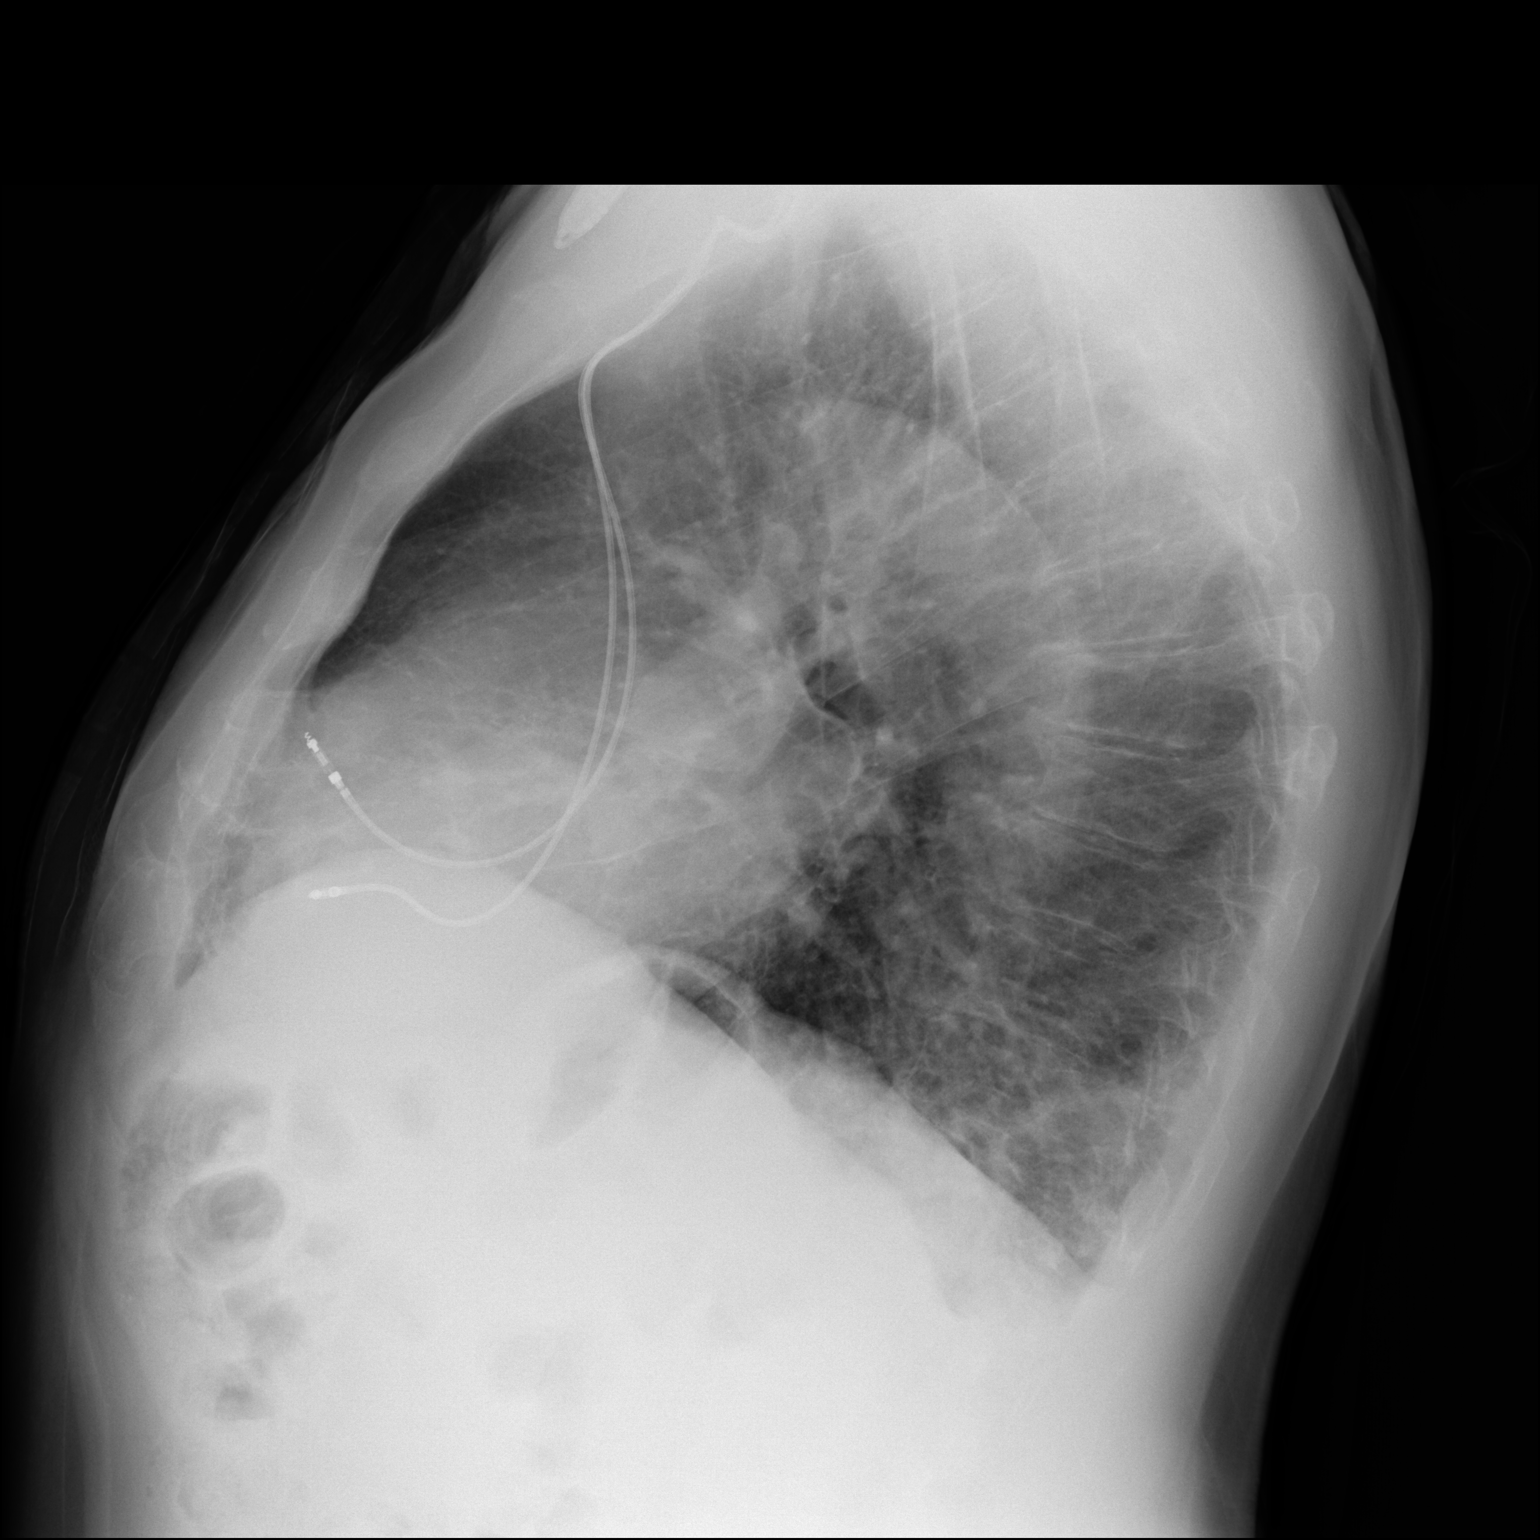

[2 of 2 positions shown; findings below may reference images not displayed]

FINDINGS: A dual lead AICD is noted. Mild right basilar atelectasis and/or
infiltrate, predominant stable in severity when compared to the
prior study. There is no evidence of a pleural effusion or
pneumothorax. The heart size and mediastinal contours are within
normal limits. The visualized skeletal structures are unremarkable.
IMPRESSION: Persistent mild right basilar atelectasis and/or infiltrate.

## 2020-08-22 NOTE — Progress Notes (Signed)
Remote pacemaker transmission.   

## 2020-09-03 DIAGNOSIS — L918 Other hypertrophic disorders of the skin: Secondary | ICD-10-CM | POA: Diagnosis not present

## 2020-09-03 DIAGNOSIS — D225 Melanocytic nevi of trunk: Secondary | ICD-10-CM | POA: Diagnosis not present

## 2020-09-03 DIAGNOSIS — L111 Transient acantholytic dermatosis [Grover]: Secondary | ICD-10-CM | POA: Diagnosis not present

## 2020-09-03 DIAGNOSIS — D485 Neoplasm of uncertain behavior of skin: Secondary | ICD-10-CM | POA: Diagnosis not present

## 2020-09-03 DIAGNOSIS — L570 Actinic keratosis: Secondary | ICD-10-CM | POA: Diagnosis not present

## 2020-09-03 DIAGNOSIS — D1801 Hemangioma of skin and subcutaneous tissue: Secondary | ICD-10-CM | POA: Diagnosis not present

## 2020-09-03 DIAGNOSIS — L821 Other seborrheic keratosis: Secondary | ICD-10-CM | POA: Diagnosis not present

## 2020-09-16 ENCOUNTER — Other Ambulatory Visit: Payer: Self-pay | Admitting: Internal Medicine

## 2020-09-16 DIAGNOSIS — I4819 Other persistent atrial fibrillation: Secondary | ICD-10-CM

## 2020-09-16 NOTE — Telephone Encounter (Signed)
Eliquis 5mg  refill request received. Patient is 75 years old, weight-89.8kg, Crea-0.79 on 06/06/2020, Diagnosis-Afib, and last seen by Dr. Rayann Heman on 05/13/2020. Dose is appropriate based on dosing criteria. Will send in refill to requested pharmacy.

## 2020-09-25 ENCOUNTER — Other Ambulatory Visit: Payer: Self-pay

## 2020-09-25 ENCOUNTER — Encounter: Payer: Self-pay | Admitting: Pulmonary Disease

## 2020-09-25 ENCOUNTER — Ambulatory Visit (INDEPENDENT_AMBULATORY_CARE_PROVIDER_SITE_OTHER): Payer: Medicare Other | Admitting: Pulmonary Disease

## 2020-09-25 VITALS — BP 120/72 | HR 67 | Ht 74.0 in | Wt 200.6 lb

## 2020-09-25 DIAGNOSIS — R9389 Abnormal findings on diagnostic imaging of other specified body structures: Secondary | ICD-10-CM

## 2020-09-25 NOTE — Progress Notes (Signed)
@Patient  ID: Todd Chapman, male    DOB: 04/13/1945, 75 y.o.   MRN: [id number]  Chief Complaint  Patient presents with   Consult    Referred by PCP Dr. Inda Merlin for abnormal CXR in June 2022 and increased SOB over the past 6 months. Has a history of prostate cx (completed radiation) and afib (has a pacemaker). Has a productive cough with orangish phlegm, mostly in the mornings.     Referring provider: [name], MD  HPI:   75 year old whom we are seeing in consultation for evaluation of abnormal chest imaging.  PCP note reviewed.  Notes from hospitalization 3/22 reviewed including H&P, op note, discharge summary.  Patient was admitted 06/01/2020 in setting of abdominal pain and nausea and vomiting.  Has been having chronic diarrhea but the prior 24 hours he had severe constipation.  Had projectile vomiting.  Came to the ED.  CT scan showed high-grade small bowel obstruction.  The portion of the right lower lobe that was seen on the CT scan on my interpretation reveals diffuse nodular and groundglass infiltrates consistent with pneumonia, notably left lower lobe is totally clear.  He was treated with Unasyn for aspiration pneumonia.  Went to the OR for lysis of adhesions for SBO.  He improved and was discharged on 06/06/2020.  Serial chest x-rays including 3/29, 4/11, and 6/7 were reviewed and on my interpretation reveal slowly and mildly improving right lower lobe infiltrate that persists over time.  Returns he feels, he rates himself a 7 out of 10.  He notes that he finished radiation for prostate cancer early March 2022.  He feels like he still recovering from that.  Said it wiped him out, but he felt quite fatigued.  Less active.  Reports he was hospitalized March 2022 and still feel like he is recovering from that.  Has some dyspnea on exertion.  However he does note that he does 20 minutes or so on elliptical routinely.  Can push up to 40 minutes but feels worn out, legs tired.  He  occasionally every week or 2 coughs up some orange appearing phlegm from his lungs.  He has a history of reflux.  Treated pantoprazole.  Does note some hoarse voice occasionally in the morning but slowly clears over the course of the day.  Feels like there is phlegm there but nothing comes out.  He has mild nasal congestion that he treats with saline rinses.  Denies sensation of postnasal drip.  PMH: Hyperlipidemia, GERD, A. fib on Eliquis Surgical history: Appendectomy, lysis of adhesions pacemaker placement Family history: Mother with CVA, CAD, father with CAD Social history: Minimal to no smoking history, lives in [place] / Pulmonary Flowsheets:   ACT:  No flowsheet data found.  MMRC: No flowsheet data found.  Epworth:  No flowsheet data found.  Tests:   FENO:  No results found for: NITRICOXIDE  PFT: PFT Results Latest Ref Rng & Units 11/26/2016  FVC-Pre L 4.80  FVC-Predicted Pre % 96  FVC-Post L 4.69  FVC-Predicted Post % 94  Pre FEV1/FVC % % 75  Post FEV1/FCV % % 82  FEV1-Pre L 3.59  FEV1-Predicted Pre % 98  FEV1-Post L 3.84  DLCO uncorrected ml/min/mmHg 30.77  DLCO UNC% % 81  DLVA Predicted % 87  TLC L 8.37  TLC % Predicted % 106  RV % Predicted % 118  Personally reviewed and interpreted as normal spirometry, normal lung volumes, DLCO within normal limits  WALK:  No flowsheet data found.  Imaging: Personally reviewed and as per EMR discussion of this note  Lab Results: Personally reviewed CBC    Component Value Date/Time   WBC 7.3 06/06/2020 0343   RBC 3.16 (L) 06/06/2020 0343   HGB 9.8 (L) 06/06/2020 0343   HGB 12.9 (L) 02/08/2017 1417   HCT 30.9 (L) 06/06/2020 0343   HCT 38.1 02/08/2017 1417   PLT 163 06/06/2020 0343   PLT 168 02/08/2017 1417   MCV 97.8 06/06/2020 0343   MCV 89 02/08/2017 1417   MCH 31.0 06/06/2020 0343   MCHC 31.7 06/06/2020 0343   RDW 13.5 06/06/2020 0343   RDW 13.0 02/08/2017 1417   LYMPHSABS 0.3 (L)  06/05/2020 0420   LYMPHSABS 1.5 02/08/2017 1417   MONOABS 0.4 06/05/2020 0420   EOSABS 0.0 06/05/2020 0420   EOSABS 0.1 02/08/2017 1417   BASOSABS 0.0 06/05/2020 0420   BASOSABS 0.0 02/08/2017 1417    BMET    Component Value Date/Time   NA 141 06/06/2020 0343   NA 141 02/08/2017 1417   K 3.8 06/06/2020 0343   CL 109 06/06/2020 0343   CO2 26 06/06/2020 0343   GLUCOSE 116 (H) 06/06/2020 0343   BUN 29 (H) 06/06/2020 0343   BUN 14 02/08/2017 1417   CREATININE 0.79 06/06/2020 0343   CALCIUM 9.2 06/06/2020 0343   GFRNONAA >60 06/06/2020 0343   GFRAA >60 02/17/2017 0517    BNP No results found for: BNP  ProBNP No results found for: PROBNP  Specialty Problems       Pulmonary Problems   Acute upper respiratory infection   Bronchitis    Allergies  Allergen Reactions   Atorvastatin Palpitations   Ezetimibe Palpitations    Immunization History  Administered Date(s) Administered   PFIZER(Purple Top)SARS-COV-2 Vaccination 04/13/2019, 05/08/2019    Past Medical History:  Diagnosis Date   Arthritis    "thumbs, back" (02/16/2017)   Bradycardia 02/2017   Dyspnea    with exertion   Flu 2 weeks ago   GERD (gastroesophageal reflux disease)    Hepatitis A as child   HLD (hyperlipidemia)    Persistent atrial fibrillation ([place]) 12/18/2016   Pneumonia 1999; 2018   Presence of permanent cardiac pacemaker    [facility]   Prostate cancer ([place])     Tobacco History: Social History   Tobacco Use  Smoking Status Former   Packs/day: 0.12   Years: 8.00   Pack years: 0.96   Types: Cigarettes   Quit date: 11/24/1965   Years since quitting: 54.8  Smokeless Tobacco Never   Counseling given: Not Answered   Continue to not smoke  Outpatient Encounter Medications as of 09/25/2020  Medication Sig   acetaminophen (TYLENOL) 500 MG tablet Take 500 mg by mouth daily as needed for mild pain.   ELIQUIS 5 MG TABS tablet TAKE 1 TABLET(5 MG) BY MOUTH TWICE DAILY   Multiple Vitamin  (MULTIVITAMIN WITH MINERALS) TABS tablet Take 1 tablet by mouth daily.   Multiple Vitamins-Minerals (EYE VITAMINS) CAPS Take 1 capsule by mouth daily.    pantoprazole (PROTONIX) 40 MG tablet Take 40 mg daily by mouth.   Propylene Glycol (SYSTANE BALANCE OP) Place 1 drop into both eyes daily as needed (dry eyes).   rosuvastatin (CRESTOR) 10 MG tablet Take 10 mg by mouth every morning.   sodium chloride (OCEAN) 0.65 % SOLN nasal spray Place 1 spray into both nostrils daily.   tamsulosin (FLOMAX) 0.4 MG CAPS capsule Take 1 capsule (  0.4 mg total) by mouth daily after supper.   [DISCONTINUED] ondansetron (ZOFRAN ODT) 4 MG disintegrating tablet Take 1 tablet (4 mg total) by mouth every 4 (four) hours as needed for nausea or vomiting.   [DISCONTINUED] MAGNESIUM CITRATE PO Take 1 Bottle by mouth daily as needed (constipation). (Patient not taking: Reported on 06/24/2020)   [DISCONTINUED] magnesium hydroxide (DULCOLAX) 400 MG/5ML suspension Take 15 mLs by mouth daily as needed for mild constipation. (Patient not taking: Reported on 06/24/2020)   [DISCONTINUED] OVER THE COUNTER MEDICATION Place 1 suppository rectally daily as needed (constipation). Not sure about name (Patient not taking: Reported on 06/24/2020)   [DISCONTINUED] polyethylene glycol (MIRALAX / GLYCOLAX) 17 g packet Take 17 g by mouth daily as needed for moderate constipation. (Patient not taking: Reported on 06/24/2020)   No facility-administered encounter medications on file as of 09/25/2020.     Review of Systems  Review of Systems  No chest pain with exertion.  No orthopnea or PND.  Comprehensive review of systems otherwise negative. Physical Exam  BP 120/72   Pulse 67   Ht 6\' 2"  (1.88 m)   Wt 200 lb 9.6 oz (91 kg)   SpO2 98% Comment: on RA  BMI 25.76 kg/m   Wt Readings from Last 5 Encounters:  09/25/20 200 lb 9.6 oz (91 kg)  07/18/20 198 lb (89.8 kg)  06/05/20 198 lb 3.1 oz (89.9 kg)  05/13/20 208 lb (94.3 kg)  01/09/20 207  lb (93.9 kg)    BMI Readings from Last 5 Encounters:  09/25/20 25.76 kg/m  07/18/20 25.42 kg/m  06/05/20 25.45 kg/m  05/13/20 26.71 kg/m  01/09/20 26.58 kg/m     Physical Exam General: Well-appearing, no distress Eyes: EOMI, no icterus Neck: Supple, no JVD Nose: Edematous, erythematous turbinates without discharge Pulmonary: Clear to auscultate bilaterally, no wheeze or crackle Cardiovascular: Irregularly irregular, regular rate Abdomen: Nondistended, bowel sounds present MSK: No synovitis, no joint effusion Neuro: Normal gait, no weakness Psych: Normal, full affect   Assessment & Plan:   Persistent right lung infiltrate: Initially seen CT scan 05/2020 in the setting of clinical aspiration with his SBO and vomiting.  To my eye serial improvement but persistence of right lung infiltrate on subsequent chest x-rays in the interim.  Suspect related to fibrosis from infection, aspiration.  We will repeat CT scan for further evaluation.  Dyspnea on exertion: Suspect related deconditioning after radiation and hospitalization in March.  He reports relatively good cardiovascular tolerance given description of his elliptical workouts.  This is very reassuring.  Can consider PFTs in the future.  Notably they were normal in 2018.  Return in about 4 weeks (around 10/23/2020).   Lanier Clam, MD 09/25/2020

## 2020-09-25 NOTE — Patient Instructions (Signed)
Nice to meet you  I think the infiltrate or shadow in your lung is related to pneumonia from when you were in the hospital in March.  Sometimes these are slow to improve on chest x-rays and CT scans.  Given his been there for many weeks, I do worry that some of the lung is healing with scarring.  I have ordered a CT scan to further evaluate this.  Return to clinic in 4 weeks or sooner as needed to discuss results and neck steps.  I hope to give you pulmonary results before our visit.

## 2020-09-30 DIAGNOSIS — H35363 Drusen (degenerative) of macula, bilateral: Secondary | ICD-10-CM | POA: Diagnosis not present

## 2020-09-30 DIAGNOSIS — H26493 Other secondary cataract, bilateral: Secondary | ICD-10-CM | POA: Diagnosis not present

## 2020-09-30 DIAGNOSIS — H02834 Dermatochalasis of left upper eyelid: Secondary | ICD-10-CM | POA: Diagnosis not present

## 2020-09-30 DIAGNOSIS — Z961 Presence of intraocular lens: Secondary | ICD-10-CM | POA: Diagnosis not present

## 2020-09-30 DIAGNOSIS — H02831 Dermatochalasis of right upper eyelid: Secondary | ICD-10-CM | POA: Diagnosis not present

## 2020-09-30 DIAGNOSIS — D3132 Benign neoplasm of left choroid: Secondary | ICD-10-CM | POA: Diagnosis not present

## 2020-10-03 ENCOUNTER — Telehealth: Payer: Self-pay | Admitting: Pulmonary Disease

## 2020-10-03 DIAGNOSIS — R9389 Abnormal findings on diagnostic imaging of other specified body structures: Secondary | ICD-10-CM

## 2020-10-03 DIAGNOSIS — J9811 Atelectasis: Secondary | ICD-10-CM

## 2020-10-03 NOTE — Telephone Encounter (Signed)
Pt is calling in regards to getting scheduled for his CT scan. Pls regard; [phone]

## 2020-10-03 NOTE — Telephone Encounter (Signed)
No order placed. Routing to triage to see if ones should be placed. Please advise.

## 2020-10-03 NOTE — Telephone Encounter (Signed)
ATC patient to discuss message about getting CT scan done. Per AVS from Dr. Silas Flood he did want a CT scan ordered but I do not see an order. Order has been placed. Per DPR ok to leave detailed message, left message for patient letting him know it has been placed and he would get a call to get it scheduled. Advised him to call with any questions or concerns. Nothing further needed at this time.

## 2020-10-07 DIAGNOSIS — M25552 Pain in left hip: Secondary | ICD-10-CM | POA: Diagnosis not present

## 2020-10-07 DIAGNOSIS — M533 Sacrococcygeal disorders, not elsewhere classified: Secondary | ICD-10-CM | POA: Diagnosis not present

## 2020-10-08 ENCOUNTER — Other Ambulatory Visit: Payer: Self-pay | Admitting: Orthopedic Surgery

## 2020-10-08 ENCOUNTER — Other Ambulatory Visit (HOSPITAL_COMMUNITY): Payer: Self-pay | Admitting: Orthopedic Surgery

## 2020-10-08 DIAGNOSIS — M25552 Pain in left hip: Secondary | ICD-10-CM

## 2020-10-22 ENCOUNTER — Ambulatory Visit (HOSPITAL_COMMUNITY)
Admission: RE | Admit: 2020-10-22 | Discharge: 2020-10-22 | Disposition: A | Discharge status: Home / Self Care | Payer: Medicare Other | Source: Ambulatory Visit | Attending: Pulmonary Disease | Admitting: Pulmonary Disease

## 2020-10-22 ENCOUNTER — Encounter (HOSPITAL_COMMUNITY): Payer: Self-pay

## 2020-10-22 ENCOUNTER — Other Ambulatory Visit: Payer: Self-pay

## 2020-10-22 DIAGNOSIS — J9811 Atelectasis: Secondary | ICD-10-CM | POA: Diagnosis not present

## 2020-10-22 DIAGNOSIS — R9389 Abnormal findings on diagnostic imaging of other specified body structures: Secondary | ICD-10-CM | POA: Insufficient documentation

## 2020-10-22 DIAGNOSIS — R911 Solitary pulmonary nodule: Secondary | ICD-10-CM | POA: Diagnosis not present

## 2020-10-22 DIAGNOSIS — I700 Atherosclerosis of aorta: Secondary | ICD-10-CM | POA: Diagnosis not present

## 2020-10-22 DIAGNOSIS — R0600 Dyspnea, unspecified: Secondary | ICD-10-CM | POA: Diagnosis not present

## 2020-10-22 IMAGING — CT CT CHEST W/O CM
2 of 4 series · 14 of 36 positions shown, 17 images · non-contrast
Comparison: [DATE].

CLINICAL DATA: Dyspnea on exertion.

EXAM:
CT CHEST WITHOUT CONTRAST
TECHNIQUE: Multidetector CT imaging of the chest was performed following the
standard protocol without IV contrast.

[Series 2: thorax · axial · 0.79mm/px · z∈[-400,-130]mm · 11 of 161 slices shown, 14 images]
[im 13/161  mediastinal]
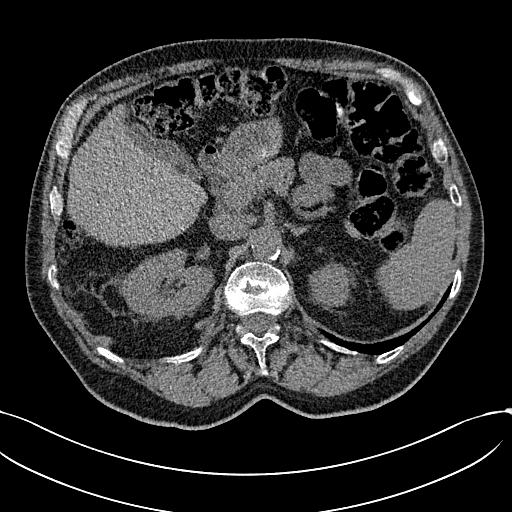
[im 13/161  lung]
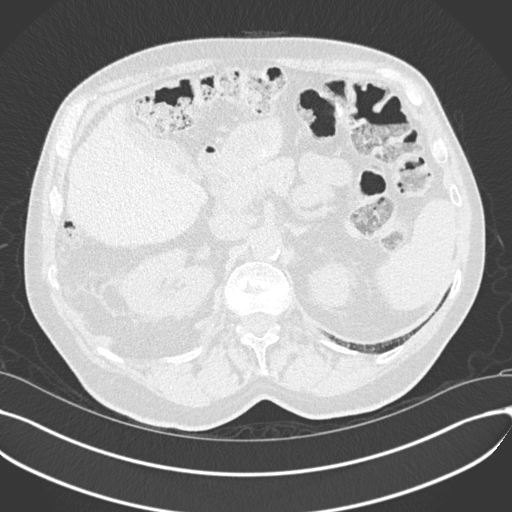
[im 25/161  lung]
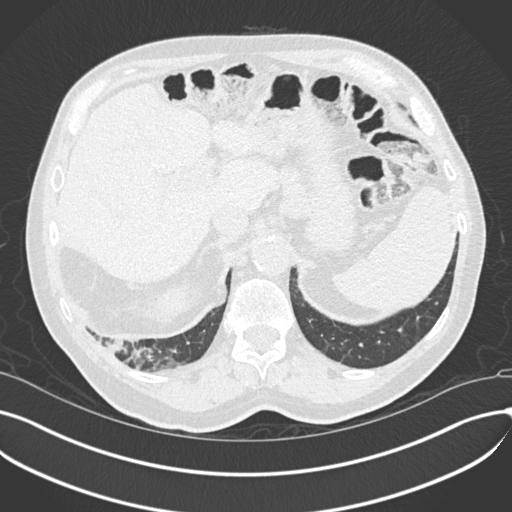
[im 37/161  lung]
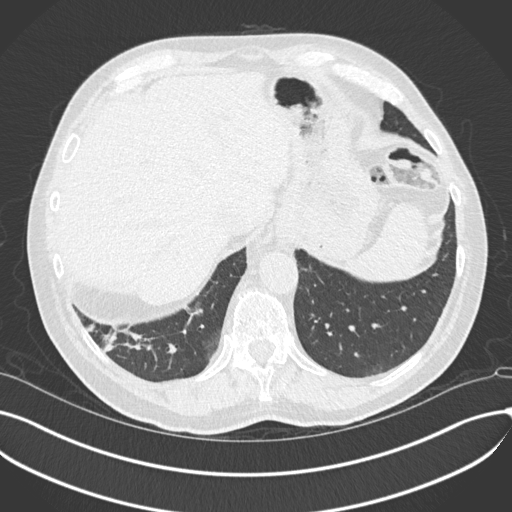
[im 50/161  lung]
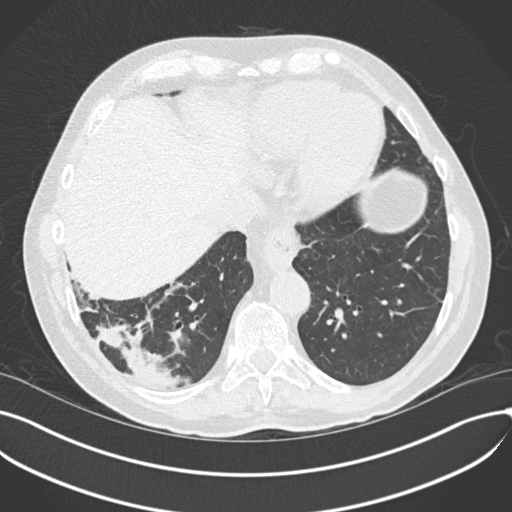
[im 62/161  mediastinal]
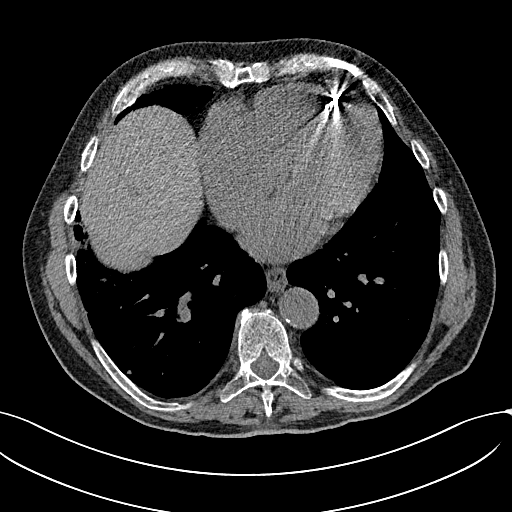
[im 62/161  lung]
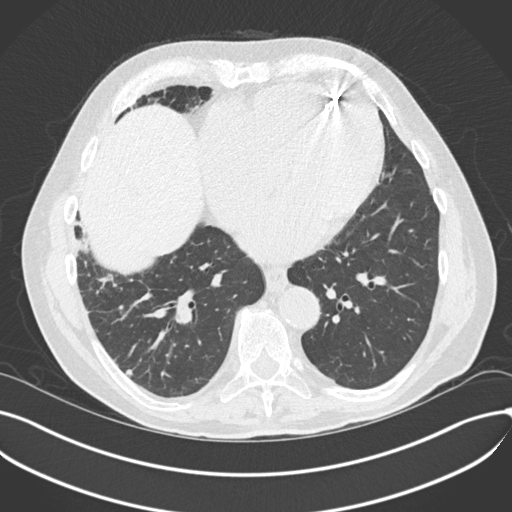
[im 87/161  lung]
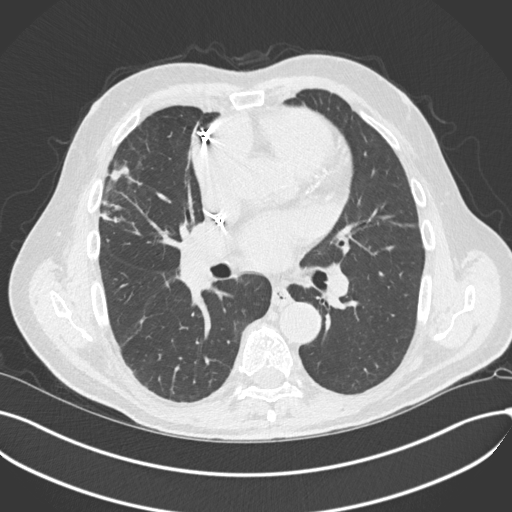
[im 99/161  lung]
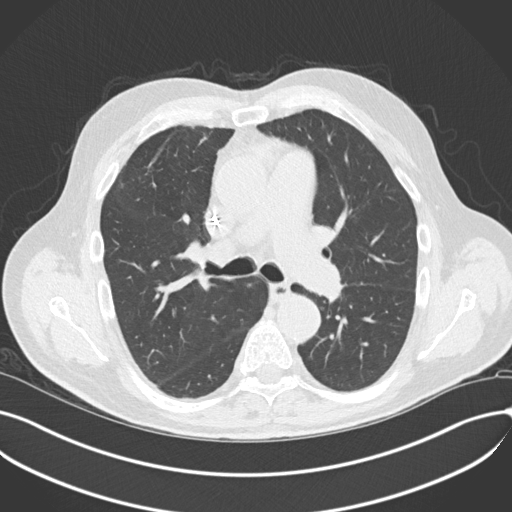
[im 111/161  lung]
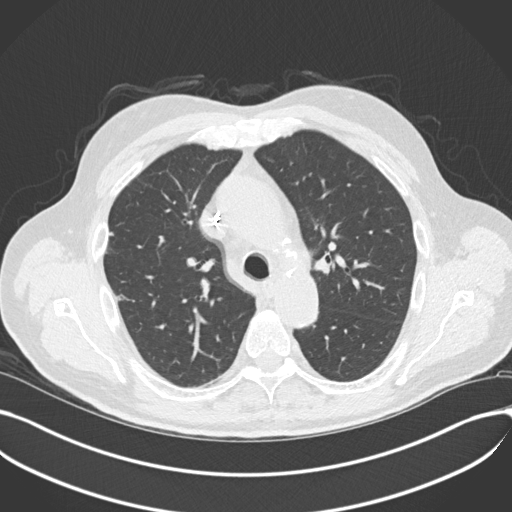
[im 124/161  mediastinal]
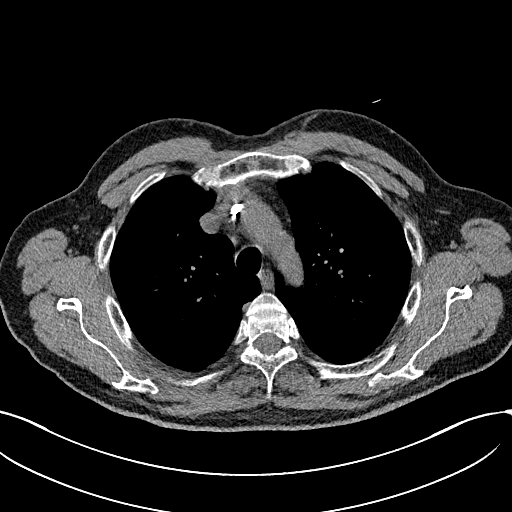
[im 124/161  lung]
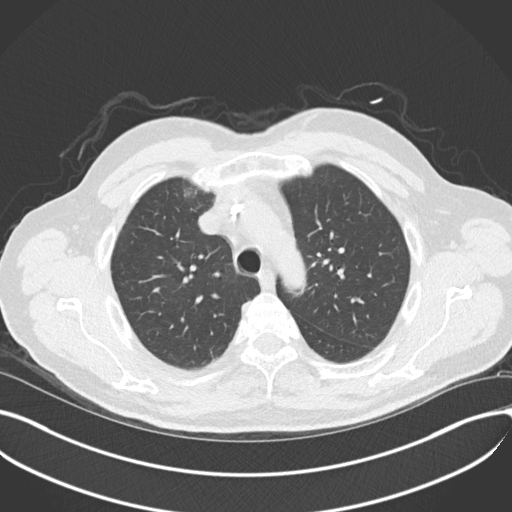
[im 136/161  lung]
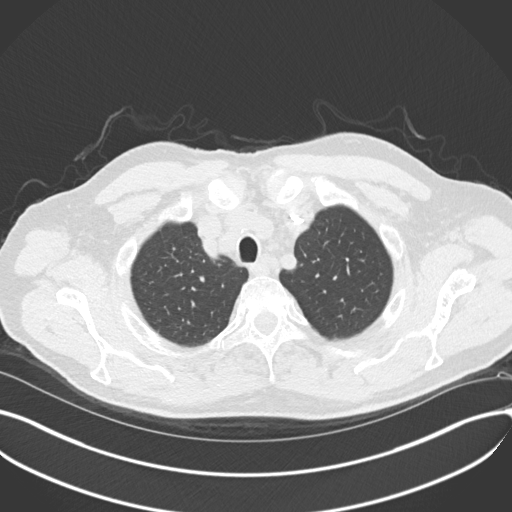
[im 148/161  lung]
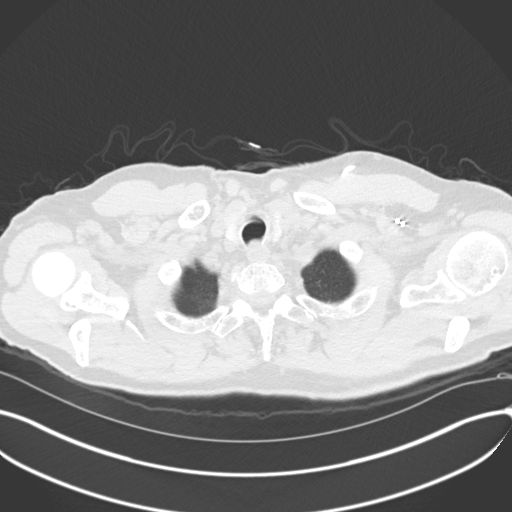

[Series 5: coronal · coronal · 0.66mm/px · 3 of 162 slices shown]
[im 33/162  lung]
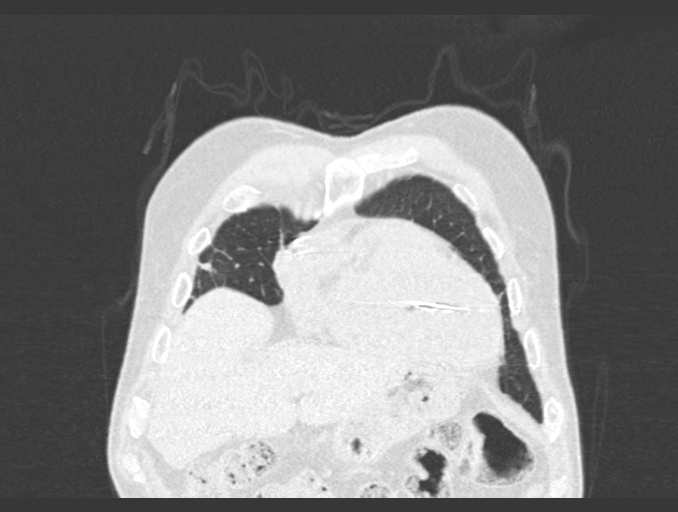
[im 65/162  lung]
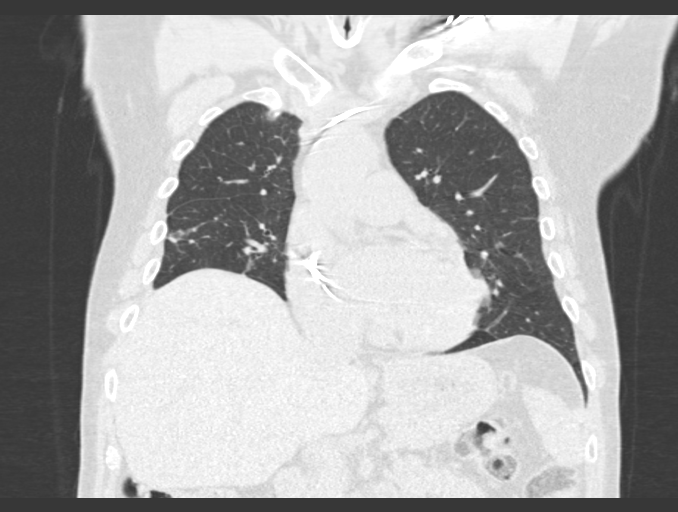
[im 97/162  lung]
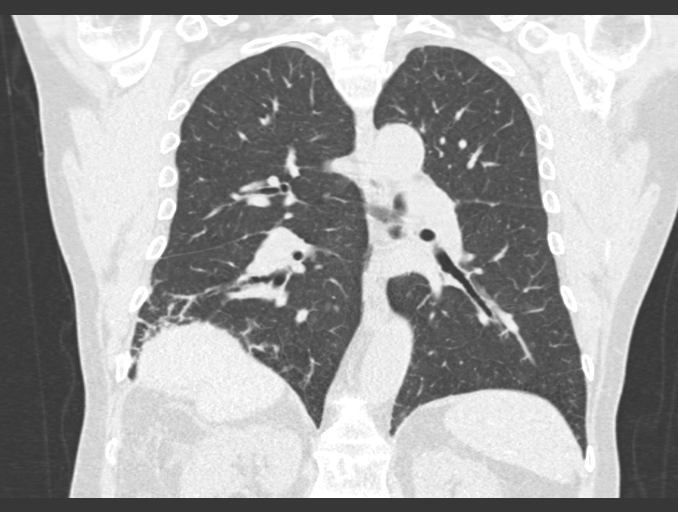

[14 of 36 positions shown; findings below may reference images not displayed]

FINDINGS: Cardiovascular: Atherosclerosis of thoracic aorta is noted without
aneurysm formation. Left-sided pacemaker is noted. Normal cardiac
size. No pericardial effusion. Coronary artery calcifications are
noted suggesting coronary artery disease.

Mediastinum/Nodes: Small sliding-type hiatal hernia is noted.
Thyroid gland is unremarkable. No significant adenopathy is noted.

Lungs/Pleura: No pneumothorax or pleural effusion is noted.
Consolidation containing air bronchograms is noted posteriorly in
the right lower lobe most consistent with pneumonia or possibly
atelectasis. However, multiple nodular densities are noted in the
surrounding parenchyma, the largest measuring 9 x 6 mm on image
number 92 of series 7. Irregular opacity is also seen laterally in
the right middle lobe with multiple nodules present which may be
inflammatory in etiology, but neoplasm cannot be excluded. 4 mm
subpleural nodule is noted laterally in the left lower lobe best
seen on image number 105 of series 7. 5 mm subpleural nodule is
noted laterally in right upper lobe best seen on image number 50 of
series 7.

Upper Abdomen: No acute abnormality.

Musculoskeletal: No chest wall mass or suspicious bone lesions
identified.
IMPRESSION: Consolidation containing air bronchograms is noted posterior in the
right lower lobe most consistent with pneumonia or possible
atelectasis. However, multiple nodular densities are noted in the
surrounding pulmonary parenchyma in the right lower lobe, the
largest measuring 9 x 6 mm. There is also noted irregular opacity
laterally in the right middle lobe with multiple nodules of varying
sizes in the surrounding parenchyma, which may simply represent
atypical inflammation, but underlying neoplasm cannot be excluded.
Follow-up unenhanced chest CT in 2-3 weeks is recommended to ensure
resolution of these abnormalities and to rule out possible neoplasm
or malignancy.

Small sliding-type hiatal hernia.

Coronary artery calcifications are noted suggesting coronary artery
disease.

Aortic Atherosclerosis ([CV]-[CV]).

## 2020-10-24 ENCOUNTER — Other Ambulatory Visit: Payer: Medicare Other

## 2020-10-30 ENCOUNTER — Ambulatory Visit (INDEPENDENT_AMBULATORY_CARE_PROVIDER_SITE_OTHER): Payer: Medicare Other

## 2020-10-30 DIAGNOSIS — I495 Sick sinus syndrome: Secondary | ICD-10-CM | POA: Diagnosis not present

## 2020-10-30 LAB — CUP PACEART REMOTE DEVICE CHECK
Battery Remaining Longevity: 74 mo
Battery Remaining Percentage: 64 %
Battery Voltage: 3.01 V
Brady Statistic RV Percent Paced: 48 %
Date Time Interrogation Session: 20220824214223
Implantable Lead Implant Date: 20181211
Implantable Lead Implant Date: 20181211
Implantable Lead Location: 753859
Implantable Lead Location: 753860
Implantable Pulse Generator Implant Date: 20181211
Lead Channel Impedance Value: 390 Ohm
Lead Channel Pacing Threshold Amplitude: 0.75 V
Lead Channel Pacing Threshold Pulse Width: 0.5 ms
Lead Channel Sensing Intrinsic Amplitude: 9.9 mV
Lead Channel Setting Pacing Amplitude: 2.5 V
Lead Channel Setting Pacing Pulse Width: 0.5 ms
Lead Channel Setting Sensing Sensitivity: 2 mV
Pulse Gen Model: 2272
Pulse Gen Serial Number: 8967264

## 2020-11-04 ENCOUNTER — Other Ambulatory Visit: Payer: Self-pay

## 2020-11-04 ENCOUNTER — Ambulatory Visit (HOSPITAL_COMMUNITY)
Admission: RE | Admit: 2020-11-04 | Discharge: 2020-11-04 | Disposition: A | Discharge status: Home / Self Care | Payer: Medicare Other | Source: Ambulatory Visit | Attending: Orthopedic Surgery | Admitting: Orthopedic Surgery

## 2020-11-04 DIAGNOSIS — M25552 Pain in left hip: Secondary | ICD-10-CM | POA: Insufficient documentation

## 2020-11-04 DIAGNOSIS — M1612 Unilateral primary osteoarthritis, left hip: Secondary | ICD-10-CM | POA: Diagnosis not present

## 2020-11-04 IMAGING — MR MR HIP*L* W/O CM
5 series · 36 of 40 positions shown · non-contrast
Comparison: Bone scan [DATE], CT abdomen/pelvis [DATE]

CLINICAL DATA: Left hip pain.

EXAM:
MR OF THE LEFT HIP WITHOUT CONTRAST
TECHNIQUE: Multiplanar, multisequence MR imaging was performed. No intravenous
contrast was administered.

[Series 4: T1 · coronal · left · 4.0mm · 1.17mm/px · 8 of 40 slices shown]
[im 1/40]
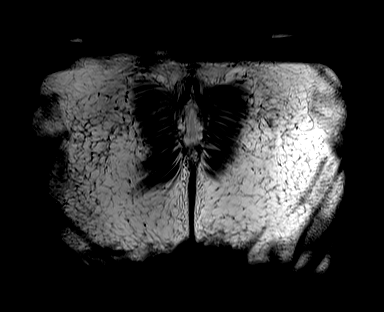
[im 5/40]
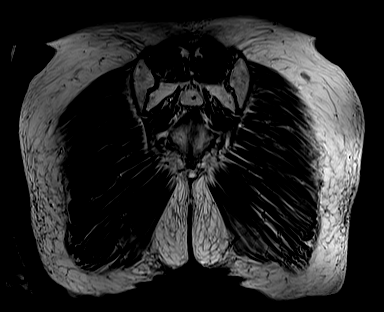
[im 14/40]
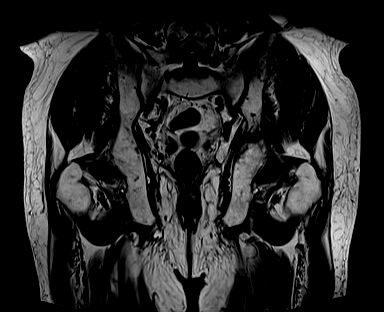
[im 18/40]
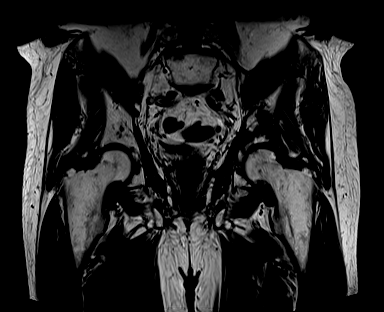
[im 22/40]
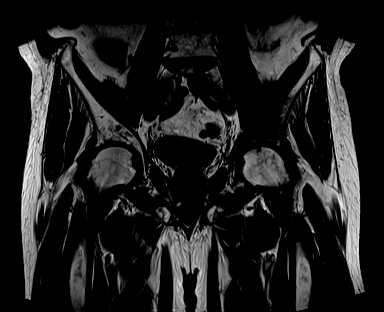
[im 27/40]
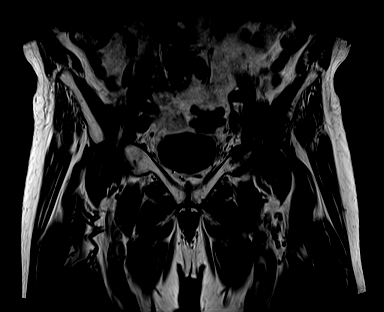
[im 35/40]
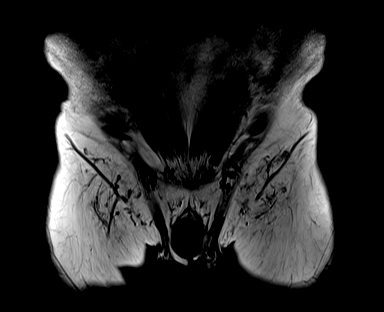
[im 40/40]
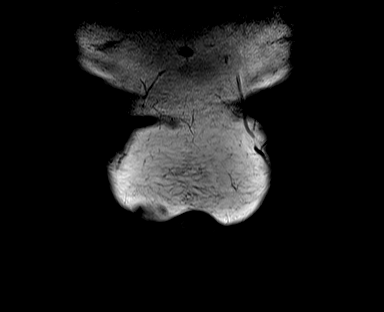

[Series 5: T2 fat-sat · coronal · left · 4.0mm · 1.00mm/px · 9 of 40 slices shown (1 of 2)]
[im 1/40]
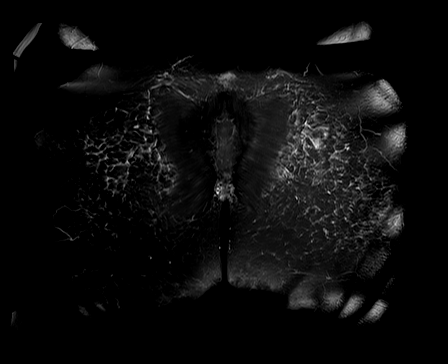
[im 5/40]
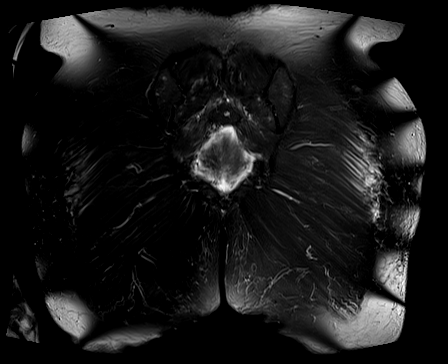
[im 10/40]
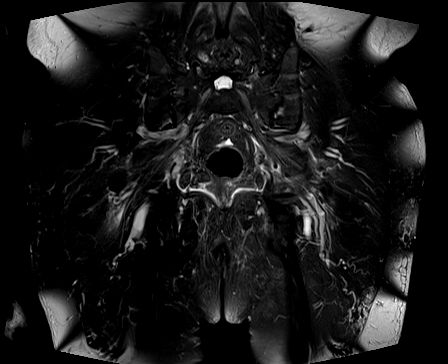
[im 15/40]
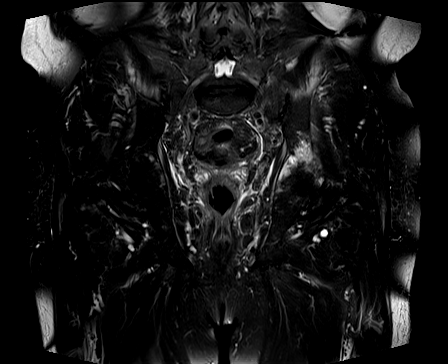
[im 20/40]
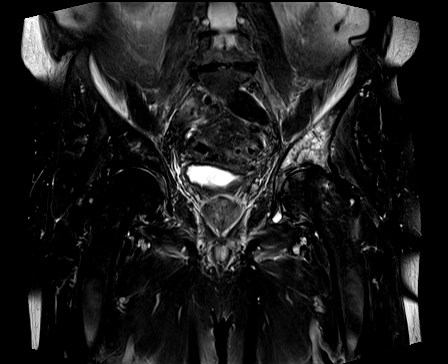
[im 25/40]
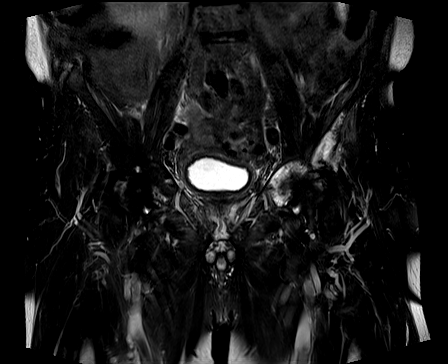
[im 30/40]
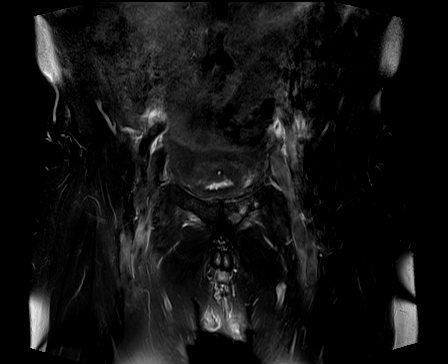
[im 35/40]
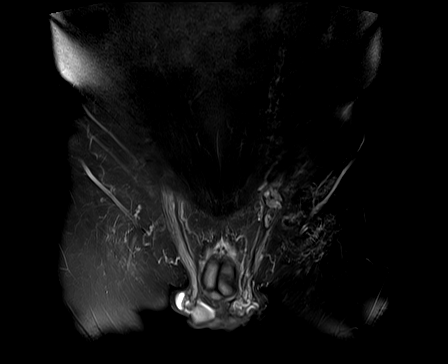
[im 40/40]
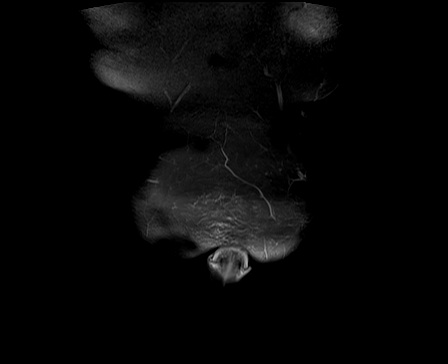

[Series 6: PD fat-sat · sagittal · left · 4.0mm · 0.62mm/px · 6 of 28 slices shown (1 of 2)]
[im 1/28]
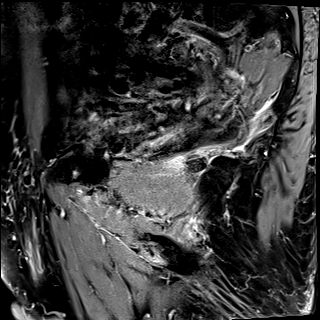
[im 6/28]
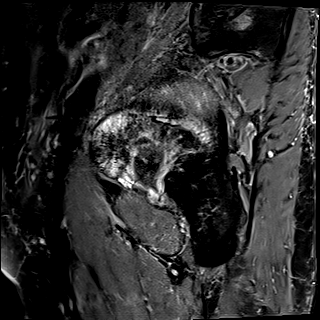
[im 11/28]
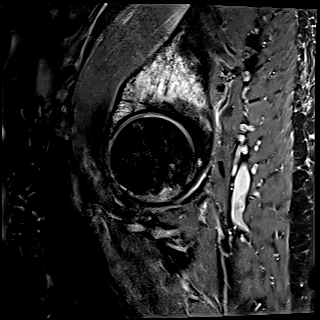
[im 17/28]
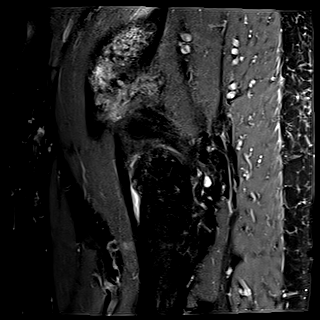
[im 22/28]
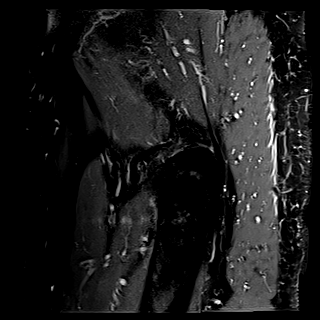
[im 28/28]
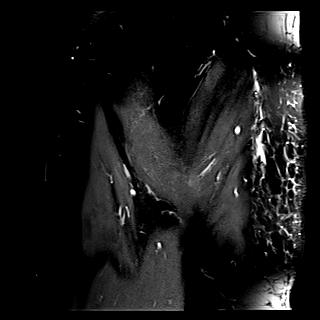

[Series 7: T2 fat-sat · axial · left · 4.0mm · 0.35mm/px · z∈[+133,+353]mm · 8 of 45 slices shown (2 of 2)]
[im 1/45]
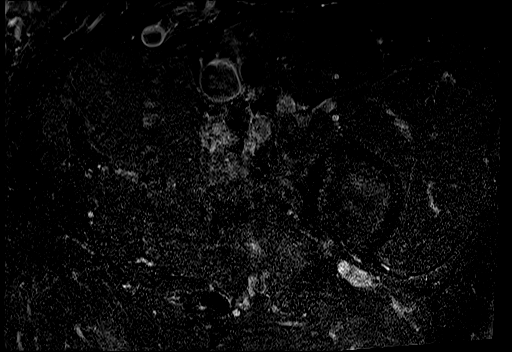
[im 5/45]
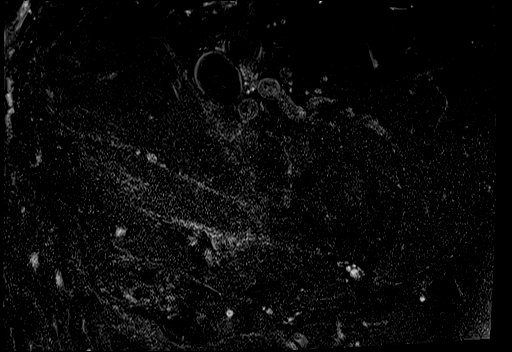
[im 15/45]
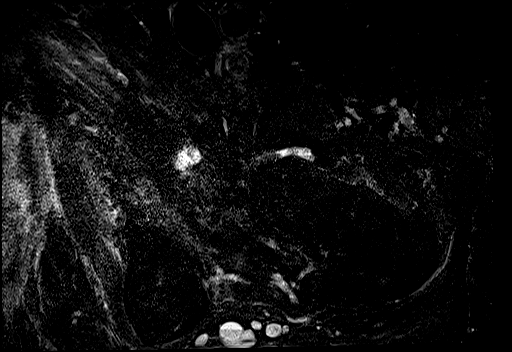
[im 20/45]
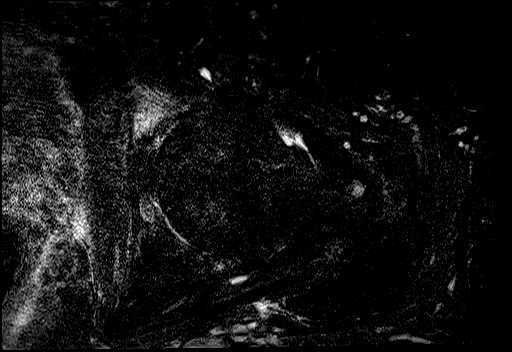
[im 25/45]
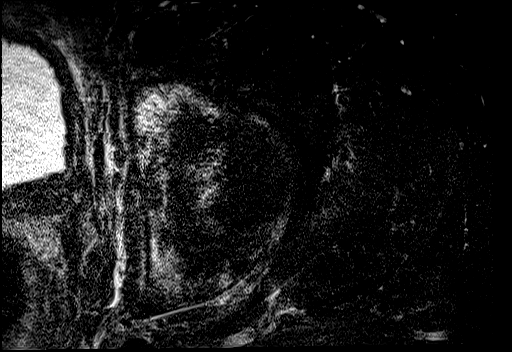
[im 30/45]
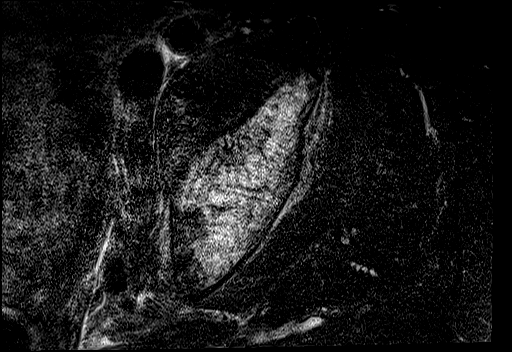
[im 40/45]
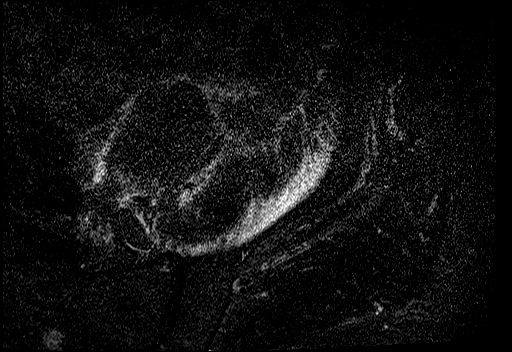
[im 45/45]
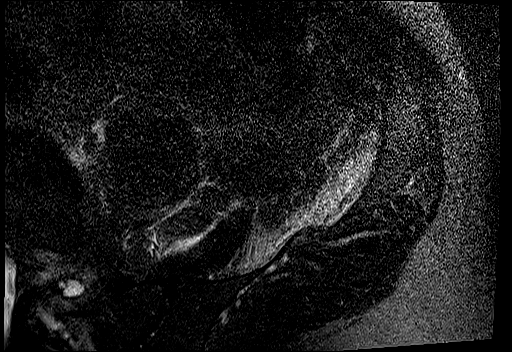

[Series 8: PD fat-sat · coronal · left · 3.0mm · 0.78mm/px · 5 of 23 slices shown (2 of 2)]
[im 1/23]
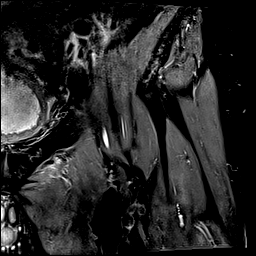
[im 6/23]
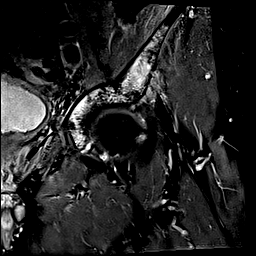
[im 12/23]
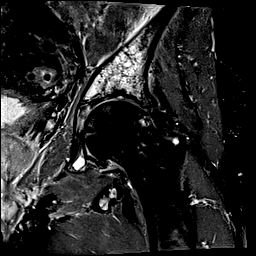
[im 17/23]
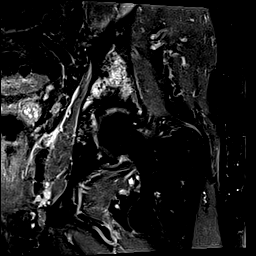
[im 23/23]
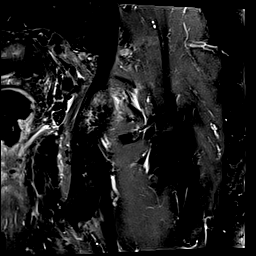

[36 of 40 positions shown; findings below may reference images not displayed]

FINDINGS: Bones:

No hip fracture, dislocation or avascular necrosis.

Severe T2 hyperintense and T1 hypointense signal in the left
superior acetabulum.

Normal sacrum and sacroiliac joints. No SI joint widening or erosive
changes.

Degenerative disease with disc height loss and facet arthropathy
lower lumbar spine.

Articular cartilage and labrum

Articular cartilage: High-grade partial-thickness cartilage loss of
the left femoral head acetabulum. High-grade partial-thickness
cartilage loss of the right femoral head and acetabulum.

Labrum:  Diffuse labral degeneration bilaterally.

Joint or bursal effusion

Joint effusion:  No hip joint effusion.  No SI joint effusion.

Bursae:  No bursa formation.

Muscles and tendons

Flexors: Normal.

Extensors: Normal.

Abductors: Normal.

Adductors: Normal.

Gluteals: Normal.

Hamstrings: Normal.

Other findings

No pelvic free fluid. No fluid collection or hematoma. No inguinal
lymphadenopathy. No inguinal hernia.
IMPRESSION: 1. Moderate osteoarthritis of bilateral hips.
2. Severe signal abnormality in the left superior acetabulum.
Differential considerations include severe stress reaction possible
developing insufficiency fracture versus underlying malignancy
secondary to metastatic disease given the patient's history of
prostate cancer. No abnormality was identified on the prior bone
scan dated [DATE] and CT abdomen/pelvis dated [DATE].
Recommend follow-up MRI of the pelvis without and with intravenous
contrast in 3 months.
3. Lumbar spine spondylosis.

## 2020-11-04 NOTE — Progress Notes (Signed)
Per order, Changed device settings for MRI to   VOO at 80 bpm  Will program device back to pre-MRI settings after completion of exam, and send transmission

## 2020-11-05 ENCOUNTER — Encounter: Payer: Self-pay | Admitting: Pulmonary Disease

## 2020-11-05 ENCOUNTER — Ambulatory Visit (INDEPENDENT_AMBULATORY_CARE_PROVIDER_SITE_OTHER): Payer: Medicare Other | Admitting: Pulmonary Disease

## 2020-11-05 VITALS — BP 118/72 | HR 68 | Ht 74.0 in | Wt 208.4 lb

## 2020-11-05 DIAGNOSIS — R911 Solitary pulmonary nodule: Secondary | ICD-10-CM | POA: Diagnosis not present

## 2020-11-05 NOTE — Progress Notes (Signed)
$'@Patient'G$  ID: [name], male    DOB: 02-27-1946, 75 y.o.   MRN: [id number]  Chief Complaint  Patient presents with   Follow-up    4wk f/u to discuss CT scan results. States he has been stable since last visit.     Referring provider: [name], MD  HPI:   75 year old whom we are seeing in follow up  for evaluation of abnormal chest imaging.    Since last visit cough gradually improved.  Still present.  But frequency and severity of cough seem better.  Color more tan as opposed to orange color to prior.  He has ongoing dyspnea on exertion.  Really with stairs.  He works out or exercise on the elliptical without much issue.  Seems like his exercise tolerance is actually quite good as discussed at last note.  He has worsening lower extremity swelling over the last couple of weeks.  We reviewed in detail his CT scan 10/18/2020 to monitor measures improved overall area of infiltrate in the right lower lobe with more denser component inferiorly and posteriorly likely reflective of developing scar as well as scattered pulmonary nodules on the right largest in the right middle lobe there were not visualized on prior CT abdomen pelvis given location.   HPI at initial visit: Patient was admitted 06/01/2020 in setting of abdominal pain and nausea and vomiting.  Has been having chronic diarrhea but the prior 24 hours he had severe constipation.  Had projectile vomiting.  Came to the ED.  CT scan showed high-grade small bowel obstruction.  The portion of the right lower lobe that was seen on the CT scan on my interpretation reveals diffuse nodular and groundglass infiltrates consistent with pneumonia, notably left lower lobe is totally clear.  He was treated with Unasyn for aspiration pneumonia.  Went to the OR for lysis of adhesions for SBO.  He improved and was discharged on 06/06/2020.  Serial chest x-rays including 3/29, 4/11, and 6/7 were reviewed and on my interpretation reveal slowly and mildly  improving right lower lobe infiltrate that persists over time.  Returns he feels, he rates himself a 7 out of 10.  He notes that he finished radiation for prostate cancer early March 2022.  He feels like he still recovering from that.  Said it wiped him out, but he felt quite fatigued.  Less active.  Reports he was hospitalized March 2022 and still feel like he is recovering from that.  Has some dyspnea on exertion.  However he does note that he does 20 minutes or so on elliptical routinely.  Can push up to 40 minutes but feels worn out, legs tired.  He occasionally every week or 2 coughs up some orange appearing phlegm from his lungs.  He has a history of reflux.  Treated pantoprazole.  Does note some hoarse voice occasionally in the morning but slowly clears over the course of the day.  Feels like there is phlegm there but nothing comes out.  He has mild nasal congestion that he treats with saline rinses.  Denies sensation of postnasal drip.  PMH: Hyperlipidemia, GERD, A. fib on Eliquis Surgical history: Appendectomy, lysis of adhesions pacemaker placement Family history: Mother with CVA, CAD, father with CAD Social history: Minimal to no smoking history, lives in [place] / Pulmonary Flowsheets:   ACT:  No flowsheet data found.  MMRC: No flowsheet data found.  Epworth:  No flowsheet data found.  Tests:   FENO:  No  results found for: NITRICOXIDE  PFT: PFT Results Latest Ref Rng & Units 11/26/2016  FVC-Pre L 4.80  FVC-Predicted Pre % 96  FVC-Post L 4.69  FVC-Predicted Post % 94  Pre FEV1/FVC % % 75  Post FEV1/FCV % % 82  FEV1-Pre L 3.59  FEV1-Predicted Pre % 98  FEV1-Post L 3.84  DLCO uncorrected ml/min/mmHg 30.77  DLCO UNC% % 81  DLVA Predicted % 87  TLC L 8.37  TLC % Predicted % 106  RV % Predicted % 118  Personally reviewed and interpreted as normal spirometry, normal lung volumes, DLCO within normal limits  WALK:  No flowsheet data  found.  Imaging: Personally reviewed and as per EMR discussion of this note  Lab Results: Personally reviewed CBC    Component Value Date/Time   WBC 7.3 06/06/2020 0343   RBC 3.16 (L) 06/06/2020 0343   HGB 9.8 (L) 06/06/2020 0343   HGB 12.9 (L) 02/08/2017 1417   HCT 30.9 (L) 06/06/2020 0343   HCT 38.1 02/08/2017 1417   PLT 163 06/06/2020 0343   PLT 168 02/08/2017 1417   MCV 97.8 06/06/2020 0343   MCV 89 02/08/2017 1417   MCH 31.0 06/06/2020 0343   MCHC 31.7 06/06/2020 0343   RDW 13.5 06/06/2020 0343   RDW 13.0 02/08/2017 1417   LYMPHSABS 0.3 (L) 06/05/2020 0420   LYMPHSABS 1.5 02/08/2017 1417   MONOABS 0.4 06/05/2020 0420   EOSABS 0.0 06/05/2020 0420   EOSABS 0.1 02/08/2017 1417   BASOSABS 0.0 06/05/2020 0420   BASOSABS 0.0 02/08/2017 1417    BMET    Component Value Date/Time   NA 141 06/06/2020 0343   NA 141 02/08/2017 1417   K 3.8 06/06/2020 0343   CL 109 06/06/2020 0343   CO2 26 06/06/2020 0343   GLUCOSE 116 (H) 06/06/2020 0343   BUN 29 (H) 06/06/2020 0343   BUN 14 02/08/2017 1417   CREATININE 0.79 06/06/2020 0343   CALCIUM 9.2 06/06/2020 0343   GFRNONAA >60 06/06/2020 0343   GFRAA >60 02/17/2017 0517    BNP No results found for: BNP  ProBNP No results found for: PROBNP  Specialty Problems       Pulmonary Problems   Acute upper respiratory infection   Bronchitis    Allergies  Allergen Reactions   Atorvastatin Palpitations   Ezetimibe Palpitations    Immunization History  Administered Date(s) Administered   PFIZER(Purple Top)SARS-COV-2 Vaccination 04/13/2019, 05/08/2019    Past Medical History:  Diagnosis Date   Arthritis    "thumbs, back" (02/16/2017)   Bradycardia 02/2017   Dyspnea    with exertion   Flu 2 weeks ago   GERD (gastroesophageal reflux disease)    Hepatitis A as child   HLD (hyperlipidemia)    Persistent atrial fibrillation ([place]) 12/18/2016   Pneumonia 1999; 2018   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   ELIQUIS 5 MG TABS tablet TAKE 1 TABLET(5 MG)  BY MOUTH TWICE DAILY   Multiple Vitamin (MULTIVITAMIN WITH MINERALS) TABS tablet Take 1 tablet by mouth daily.   Multiple Vitamins-Minerals (EYE VITAMINS) CAPS Take 1 capsule by mouth daily.    pantoprazole (PROTONIX) 40 MG tablet Take 40 mg daily by mouth.   Propylene Glycol (SYSTANE BALANCE OP) Place 1 drop into both eyes daily as needed (dry eyes).   rosuvastatin (CRESTOR) 10 MG tablet Take 10 mg by mouth every morning.   sodium chloride (OCEAN) 0.65 % SOLN nasal spray Place 1 spray into both nostrils daily.   tamsulosin (FLOMAX) 0.4 MG CAPS capsule Take 1 capsule (0.4 mg total) by mouth daily after supper.   No facility-administered encounter medications on file as of 11/05/2020.     Review of Systems  Review of Systems  No chest pain with exertion.  No orthopnea or PND.  Comprehensive review of systems otherwise negative. Physical Exam  BP 118/72   Pulse 68   Ht '6\' 2"'$  (1.88 m)   Wt 208 lb 6.4 oz (94.5 kg)   SpO2 98% Comment: on RA  BMI 26.76 kg/m   Wt Readings from Last 5 Encounters:  11/05/20 208 lb 6.4 oz (94.5 kg)  09/25/20 200 lb 9.6 oz (91 kg)  07/18/20 198 lb (89.8 kg)  06/05/20 198 lb 3.1 oz (89.9 kg)  05/13/20 208 lb (94.3 kg)    BMI Readings from Last 5 Encounters:  11/05/20 26.76 kg/m  09/25/20 25.76 kg/m  07/18/20 25.42 kg/m  06/05/20 25.45 kg/m  05/13/20 26.71 kg/m     Physical Exam General: Well-appearing, no  distress Eyes: EOMI, no icterus Neck: Supple, no JVD Nose: Edematous, erythematous turbinates without discharge Pulmonary: Clear to auscultate bilaterally,  Cardiovascular: Irregularly irregular, regular rate Abdomen: Nondistended, bowel sounds present MSK: No synovitis, no joint effusion Neuro: Normal gait, no weakness Psych: Normal, full affect   Assessment & Plan:   Persistent right lung infiltrate: Initially seen CT scan 05/2020 in the setting of clinical aspiration with his SBO and vomiting.  To my eye serial improvement but persistence of right lung infiltrate on subsequent chest x-rays in the interim.  Suspect related to fibrosis from infection, aspiration.  Repeat CT chest 10/18/2020 with improved overall right lower lobe with newly discovered nodule scattered throughout the right most notably right middle lobe.  The appearance is atypical for malignancy.  Suspect related to prior aspiration event and ongoing evolving lung injury.  Notably he does have a hiatal hernia so am concerned about silent aspiration at night leading to persistent infiltrate.  Organized pneumonia considered.  We will repeat CT scan at 6-week interval, early October 2023 for further evaluation.  If enlarging will consider bronchoscopy and biopsy.  Dyspnea on exertion: Suspect related deconditioning after radiation and hospitalization in March.  He reports relatively good cardiovascular tolerance given description of his elliptical workouts.  This is very reassuring.  Can consider PFTs in the future.  Notably they were normal in 2018.  Echocardiogram 07/2020 with dilated left atrium, suspect some element of cardiac contribution.  Notably he has worsening lower extremity swelling over the last couple weeks.  Lower extremity swelling: Worse today improved in the morning.  Likely venous insufficiency.  He does have left atrial dilation and so is at risk for cardiogenic volume overload.  He is to discuss further with his  primary care doctor.  Return in about 6 weeks (around 12/17/2020).   Lanier Clam, MD 11/05/2020   I spent 43 minutes in care of  the patient this encounter including face-to-face visit, review of records, coordination of care.

## 2020-11-05 NOTE — Patient Instructions (Signed)
Nice to see you  The abnormality on the CT scan I think is reflective of the injury the lungs sustained when you had the SBO.  There is a spot in the right middle of the lung that wee need to make sure is not growing. I ordered a CT chest for early October 2022 to folow this up. Based on results we will discuss next steps.   Return in 6 weeks

## 2020-11-11 ENCOUNTER — Other Ambulatory Visit: Payer: Self-pay | Admitting: Radiation Oncology

## 2020-11-14 NOTE — Progress Notes (Signed)
Remote pacemaker transmission.   

## 2020-11-15 DIAGNOSIS — Z79899 Other long term (current) drug therapy: Secondary | ICD-10-CM | POA: Diagnosis not present

## 2020-11-15 DIAGNOSIS — R351 Nocturia: Secondary | ICD-10-CM | POA: Diagnosis not present

## 2020-11-15 DIAGNOSIS — N401 Enlarged prostate with lower urinary tract symptoms: Secondary | ICD-10-CM | POA: Diagnosis not present

## 2020-11-15 DIAGNOSIS — C61 Malignant neoplasm of prostate: Secondary | ICD-10-CM | POA: Diagnosis not present

## 2020-11-18 DIAGNOSIS — M25552 Pain in left hip: Secondary | ICD-10-CM | POA: Diagnosis not present

## 2020-11-20 DIAGNOSIS — Z961 Presence of intraocular lens: Secondary | ICD-10-CM | POA: Diagnosis not present

## 2020-11-20 DIAGNOSIS — H26491 Other secondary cataract, right eye: Secondary | ICD-10-CM | POA: Diagnosis not present

## 2020-11-20 DIAGNOSIS — Z9889 Other specified postprocedural states: Secondary | ICD-10-CM | POA: Diagnosis not present

## 2020-11-20 DIAGNOSIS — D3132 Benign neoplasm of left choroid: Secondary | ICD-10-CM | POA: Diagnosis not present

## 2020-11-20 DIAGNOSIS — H02831 Dermatochalasis of right upper eyelid: Secondary | ICD-10-CM | POA: Diagnosis not present

## 2020-11-20 DIAGNOSIS — H35363 Drusen (degenerative) of macula, bilateral: Secondary | ICD-10-CM | POA: Diagnosis not present

## 2020-11-20 DIAGNOSIS — H02834 Dermatochalasis of left upper eyelid: Secondary | ICD-10-CM | POA: Diagnosis not present

## 2020-12-03 DIAGNOSIS — M461 Sacroiliitis, not elsewhere classified: Secondary | ICD-10-CM | POA: Diagnosis not present

## 2020-12-03 DIAGNOSIS — R911 Solitary pulmonary nodule: Secondary | ICD-10-CM | POA: Diagnosis not present

## 2020-12-16 ENCOUNTER — Ambulatory Visit (HOSPITAL_COMMUNITY): Payer: Medicare Other

## 2020-12-16 ENCOUNTER — Ambulatory Visit (HOSPITAL_COMMUNITY)
Admission: RE | Admit: 2020-12-16 | Discharge: 2020-12-16 | Disposition: A | Discharge status: Home / Self Care | Payer: Medicare Other | Source: Ambulatory Visit | Attending: Pulmonary Disease | Admitting: Pulmonary Disease

## 2020-12-16 DIAGNOSIS — J479 Bronchiectasis, uncomplicated: Secondary | ICD-10-CM | POA: Diagnosis not present

## 2020-12-16 DIAGNOSIS — R911 Solitary pulmonary nodule: Secondary | ICD-10-CM | POA: Diagnosis not present

## 2020-12-16 DIAGNOSIS — I700 Atherosclerosis of aorta: Secondary | ICD-10-CM | POA: Diagnosis not present

## 2020-12-16 IMAGING — CT CT CHEST SUPER D W/O CM
2 of 5 series · 15 of 36 positions shown, 18 images · non-contrast
Comparison: [DATE].

CLINICAL DATA: Lung nodule.

EXAM:
CT CHEST WITHOUT CONTRAST
TECHNIQUE: Multidetector CT imaging of the chest was performed using thin slice
collimation for electromagnetic bronchoscopy planning purposes,
without intravenous contrast.

[Series 4: super d · axial · 0.84mm/px · z∈[-281,+3]mm · 12 of 328 slices shown, 15 images]
[im 22/328  mediastinal]
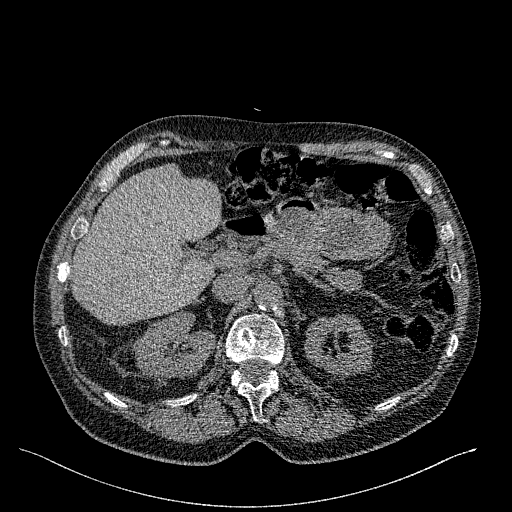
[im 22/328  lung]
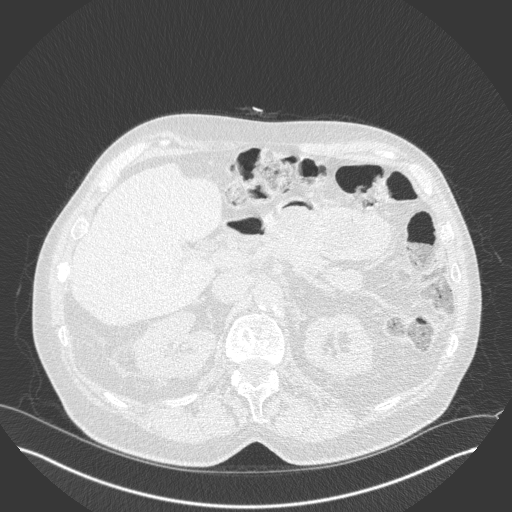
[im 44/328  lung]
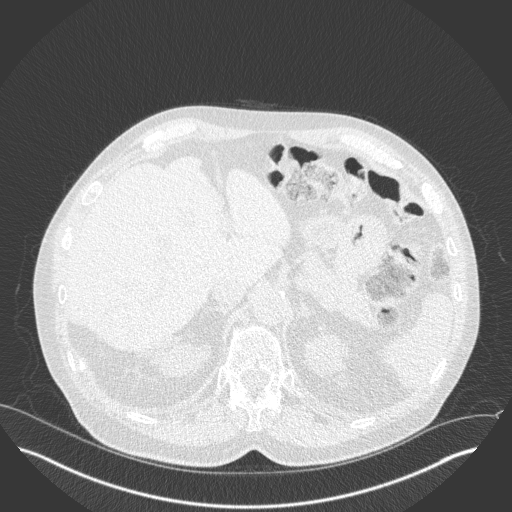
[im 66/328  lung]
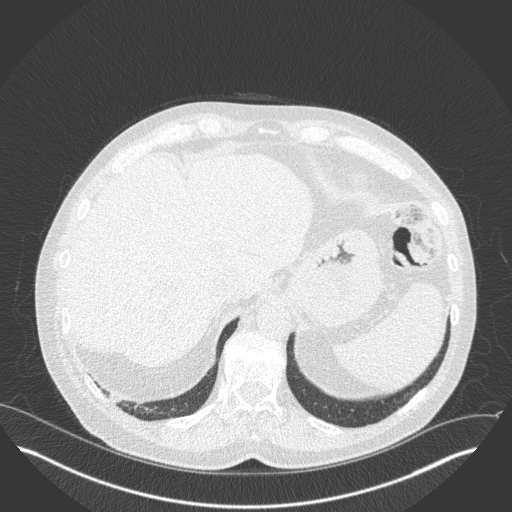
[im 110/328  lung]
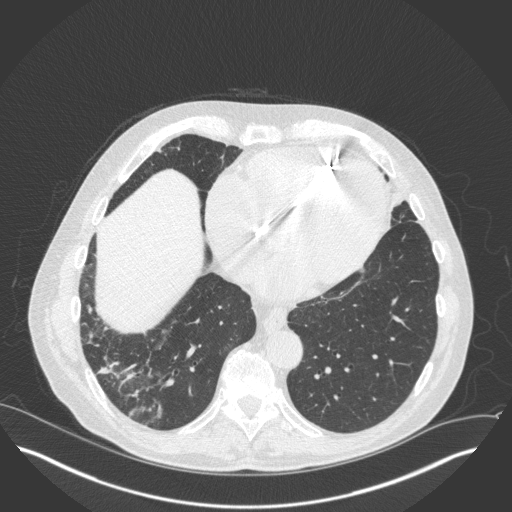
[im 131/328  mediastinal]
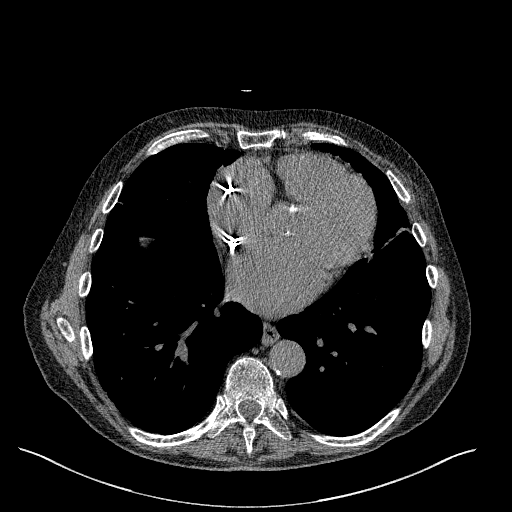
[im 131/328  lung]
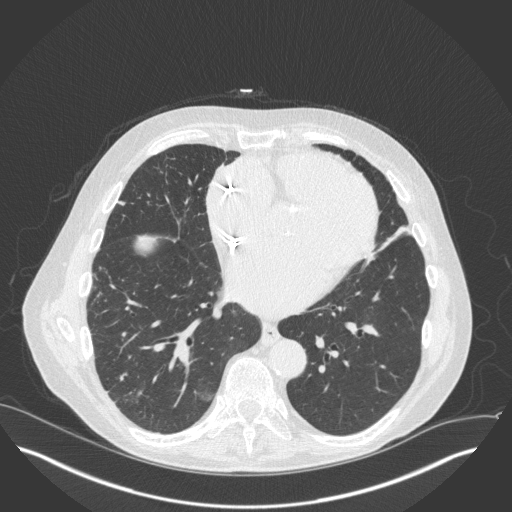
[im 153/328  lung]
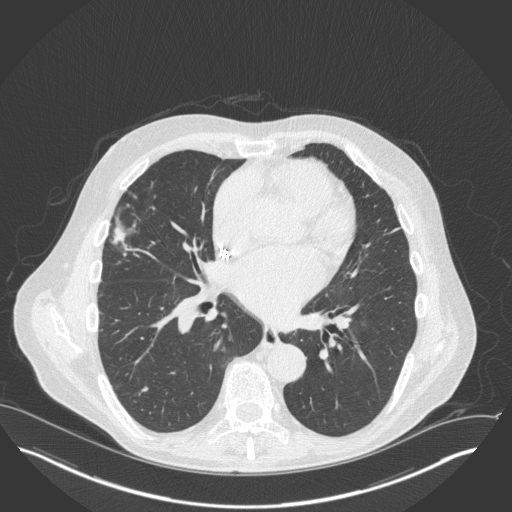
[im 175/328  lung]
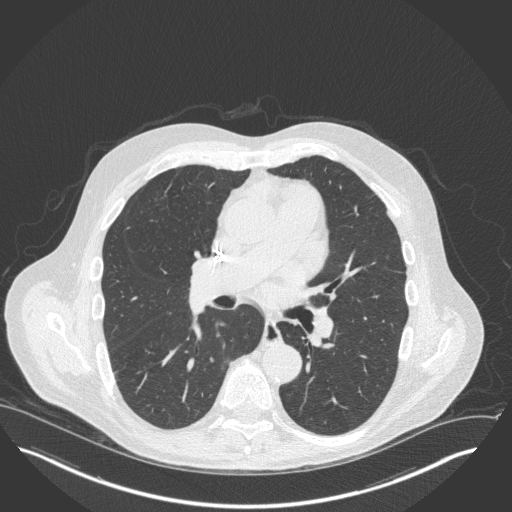
[im 197/328  lung]
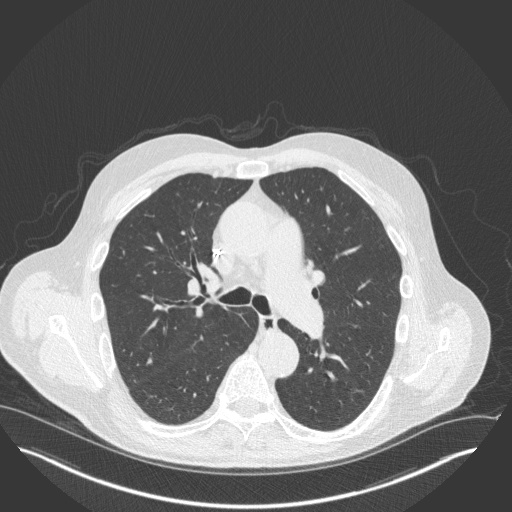
[im 219/328  mediastinal]
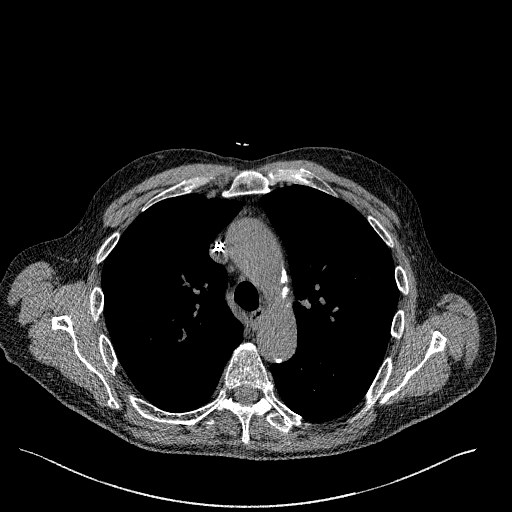
[im 219/328  lung]
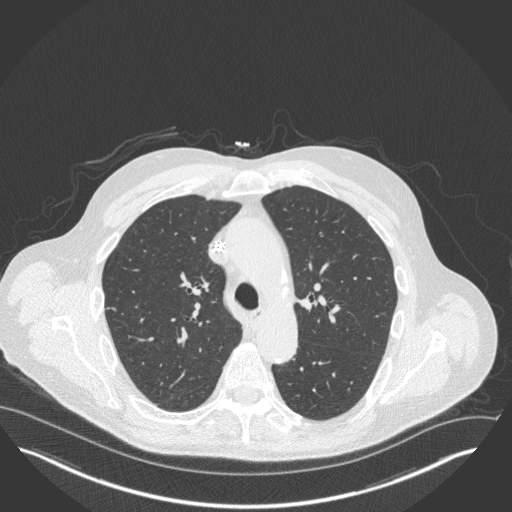
[im 262/328  lung]
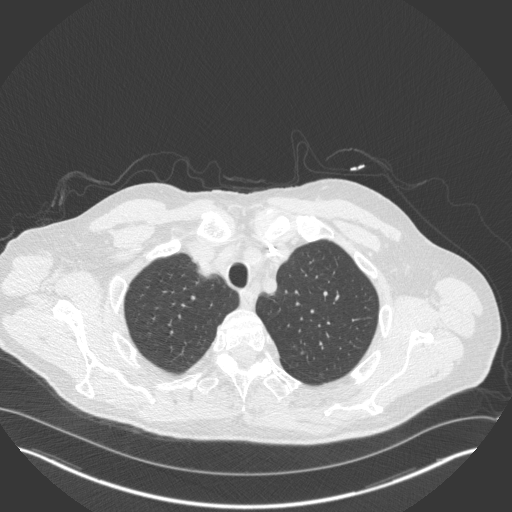
[im 284/328  lung]
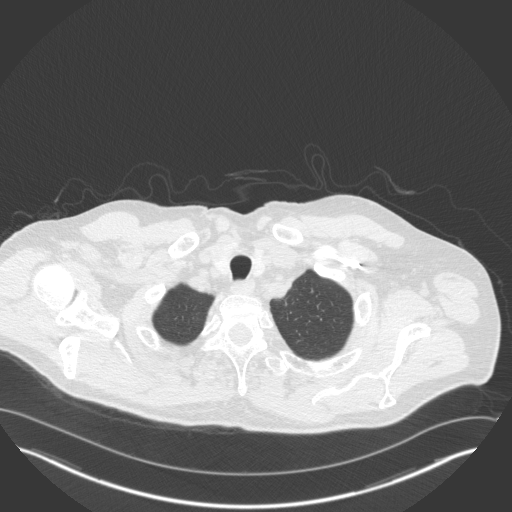
[im 306/328  lung]
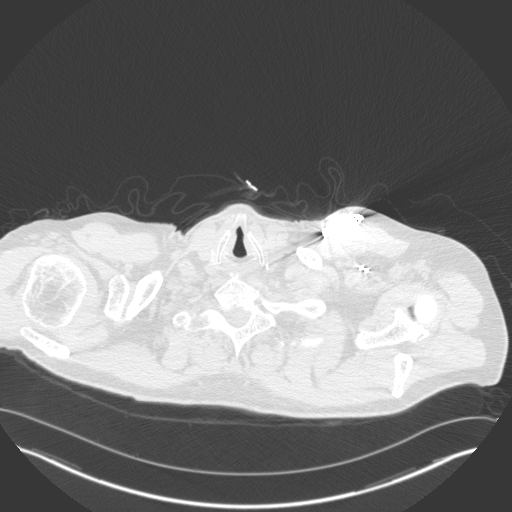

[Series 6: coronal · coronal · 0.64mm/px · 3 of 158 slices shown]
[im 32/158  lung]
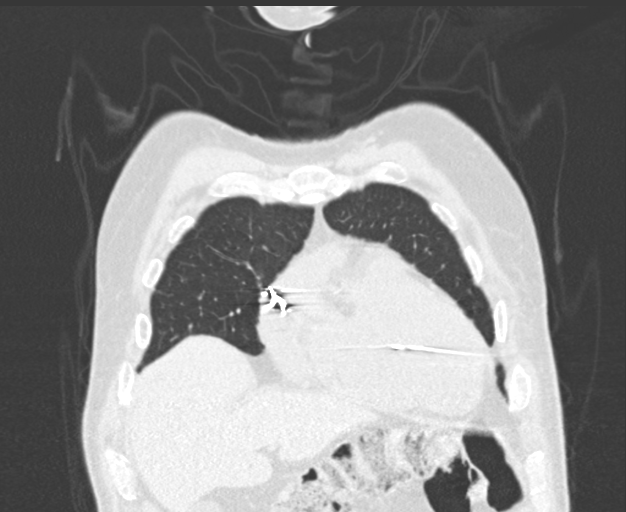
[im 63/158  lung]
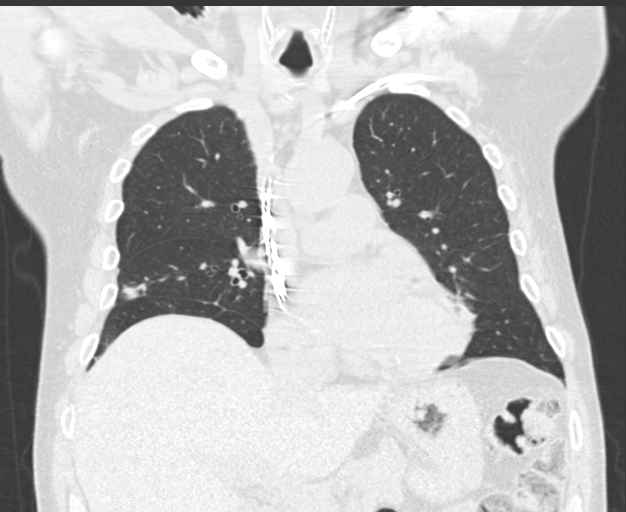
[im 95/158  lung]
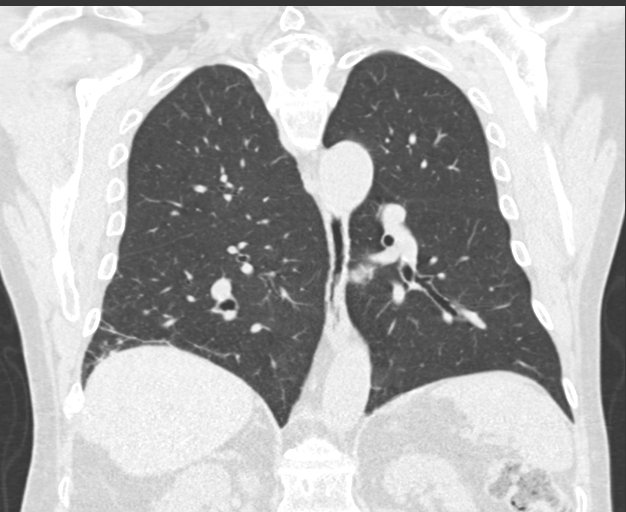

[15 of 36 positions shown; findings below may reference images not displayed]

FINDINGS: Cardiovascular: Atherosclerotic calcification of the aorta, aortic
valve and coronary arteries. Heart is enlarged. No pericardial
effusion.

Mediastinum/Nodes: Low-attenuation right thyroid nodules measure up
to 1.3 cm. No follow-up recommended. (Ref: [HOSPITAL]. [DATE]): 143-50).No pathologically enlarged mediastinal or
axillary lymph nodes. Hilar regions are difficult to definitively
evaluate without IV contrast. Esophagus is grossly unremarkable.

Lungs/Pleura: Mild bronchiectasis, peribronchovascular nodularity
and consolidation, mid and lower lung zone predominant, as before.
Consolidation in the peripheral right lower lobe has improved
somewhat in the interval. Scattered pulmonary parenchymal scarring.
No pleural fluid. Airway is unremarkable.

Upper Abdomen: Visualized portions of the liver, gallbladder,
adrenal glands, kidneys, spleen, pancreas, stomach and bowel are
grossly unremarkable.

Musculoskeletal: Degenerative changes in the spine. Old bilateral
rib fractures. Prominent Schmorl's node in the L1 superior endplate.
IMPRESSION: 1. Mid and lower lung zone predominant mild bronchiectasis,
peribronchovascular nodularity and consolidation, likely due to an
atypical infectious process, including mycobacterium avium complex.
2. Improving peripheral consolidation in the right lower lobe,
likely infectious/inflammatory in etiology.
3. Aortic atherosclerosis ([J3]-[J3]). Coronary artery
calcification.

## 2020-12-23 ENCOUNTER — Encounter: Payer: Self-pay | Admitting: Pulmonary Disease

## 2020-12-23 ENCOUNTER — Ambulatory Visit (INDEPENDENT_AMBULATORY_CARE_PROVIDER_SITE_OTHER): Payer: Medicare Other | Admitting: Pulmonary Disease

## 2020-12-23 ENCOUNTER — Other Ambulatory Visit: Payer: Self-pay

## 2020-12-23 VITALS — BP 120/76 | HR 60 | Temp 97.9°F | Ht 74.0 in | Wt 207.4 lb

## 2020-12-23 DIAGNOSIS — J690 Pneumonitis due to inhalation of food and vomit: Secondary | ICD-10-CM

## 2020-12-23 DIAGNOSIS — R0609 Other forms of dyspnea: Secondary | ICD-10-CM | POA: Diagnosis not present

## 2020-12-23 NOTE — Patient Instructions (Signed)
Nice to see you again  The CT of the chest looks improved - this is great news.  No need for routing or scheduled follow up.  Call at any time with questions or concerns and we are happy to see you at any time!

## 2020-12-23 NOTE — Progress Notes (Signed)
@Patient  ID: Todd Chapman, male    DOB: [birth year], 75 y.o.   MRN: [id number]  Chief Complaint  Patient presents with   Lung Lesion    Referring provider: [name], MD  HPI:   75 y.o. man whom we are seeing in follow up  for evaluation of abnormal chest imaging.    Doing ok. No real complaints.  Slight cough in the morning, clears throat better throughout the day.  Reviewed in detail CT scan from last week 12/2020. On my review and interpretation improving right lower lobe infiltrate as well as interval improvement of right middle lobe nodules in terms of size on measurement.  Is very reassuring.  Not consistent with malignancy.  Likely sequela of massive aspiration event during SBO earlier in the year.  HPI at initial visit: Patient was admitted 06/01/2020 in setting of abdominal pain and nausea and vomiting.  Has been having chronic diarrhea but the prior 24 hours he had severe constipation.  Had projectile vomiting.  Came to the ED.  CT scan showed high-grade small bowel obstruction.  The portion of the right lower lobe that was seen on the CT scan on my interpretation reveals diffuse nodular and groundglass infiltrates consistent with pneumonia, notably left lower lobe is totally clear.  He was treated with Unasyn for aspiration pneumonia.  Went to the OR for lysis of adhesions for SBO.  He improved and was discharged on 06/06/2020.  Serial chest x-rays including 3/29, 4/11, and 6/7 were reviewed and on my interpretation reveal slowly and mildly improving right lower lobe infiltrate that persists over time.  Returns he feels, he rates himself a 7 out of 10.  He notes that he finished radiation for prostate cancer early March 2022.  He feels like he still recovering from that.  Said it wiped him out, but he felt quite fatigued.  Less active.  Reports he was hospitalized March 2022 and still feel like he is recovering from that.  Has some dyspnea on exertion.  However he does note that  he does 20 minutes or so on elliptical routinely.  Can push up to 40 minutes but feels worn out, legs tired.  He occasionally every week or 2 coughs up some orange appearing phlegm from his lungs.  He has a history of reflux.  Treated pantoprazole.  Does note some hoarse voice occasionally in the morning but slowly clears over the course of the day.  Feels like there is phlegm there but nothing comes out.  He has mild nasal congestion that he treats with saline rinses.  Denies sensation of postnasal drip.  PMH: Hyperlipidemia, GERD, A. fib on Eliquis Surgical history: Appendectomy, lysis of adhesions pacemaker placement Family history: Mother with CVA, CAD, father with CAD Social history: Minimal to no smoking history, lives in [place] / Pulmonary Flowsheets:   ACT:  No flowsheet data found.  MMRC: No flowsheet data found.  Epworth:  No flowsheet data found.  Tests:   FENO:  No results found for: NITRICOXIDE  PFT: PFT Results Latest Ref Rng & Units 11/26/2016  FVC-Pre L 4.80  FVC-Predicted Pre % 96  FVC-Post L 4.69  FVC-Predicted Post % 94  Pre FEV1/FVC % % 75  Post FEV1/FCV % % 82  FEV1-Pre L 3.59  FEV1-Predicted Pre % 98  FEV1-Post L 3.84  DLCO uncorrected ml/min/mmHg 30.77  DLCO UNC% % 81  DLVA Predicted % 87  TLC L 8.37  TLC % Predicted % 106  RV % Predicted % 118  Personally reviewed and interpreted as normal spirometry, normal lung volumes, DLCO within normal limits  WALK:  No flowsheet data found.  Imaging: Personally reviewed and as per EMR discussion of this note  Lab Results: Personally reviewed CBC    Component Value Date/Time   WBC 7.3 06/06/2020 0343   RBC 3.16 (L) 06/06/2020 0343   HGB 9.8 (L) 06/06/2020 0343   HGB 12.9 (L) 02/08/2017 1417   HCT 30.9 (L) 06/06/2020 0343   HCT 38.1 02/08/2017 1417   PLT 163 06/06/2020 0343   PLT 168 02/08/2017 1417   MCV 97.8 06/06/2020 0343   MCV 89 02/08/2017 1417   MCH 31.0 06/06/2020  0343   MCHC 31.7 06/06/2020 0343   RDW 13.5 06/06/2020 0343   RDW 13.0 02/08/2017 1417   LYMPHSABS 0.3 (L) 06/05/2020 0420   LYMPHSABS 1.5 02/08/2017 1417   MONOABS 0.4 06/05/2020 0420   EOSABS 0.0 06/05/2020 0420   EOSABS 0.1 02/08/2017 1417   BASOSABS 0.0 06/05/2020 0420   BASOSABS 0.0 02/08/2017 1417    BMET    Component Value Date/Time   NA 141 06/06/2020 0343   NA 141 02/08/2017 1417   K 3.8 06/06/2020 0343   CL 109 06/06/2020 0343   CO2 26 06/06/2020 0343   GLUCOSE 116 (H) 06/06/2020 0343   BUN 29 (H) 06/06/2020 0343   BUN 14 02/08/2017 1417   CREATININE 0.79 06/06/2020 0343   CALCIUM 9.2 06/06/2020 0343   GFRNONAA >60 06/06/2020 0343   GFRAA >60 02/17/2017 0517    BNP No results found for: BNP  ProBNP No results found for: PROBNP  Specialty Problems       Pulmonary Problems   Acute upper respiratory infection   Bronchitis    Allergies  Allergen Reactions   Atorvastatin Palpitations   Ezetimibe Palpitations    Immunization History  Administered Date(s) Administered   PFIZER(Purple Top)SARS-COV-2 Vaccination 04/13/2019, 05/08/2019    Past Medical History:  Diagnosis Date   Arthritis    "thumbs, back" (02/16/2017)   Bradycardia 02/2017   Dyspnea    with exertion   Flu 2 weeks ago   GERD (gastroesophageal reflux disease)    Hepatitis A as child   HLD (hyperlipidemia)    Persistent atrial fibrillation ([place]) 12/18/2016   Pneumonia 1999; 2018   Presence of permanent cardiac pacemaker    st jude   Prostate cancer ([place])     Tobacco History: Social History   Tobacco Use  Smoking Status Former   Packs/day: 0.12   Years: 8.00   Pack years: 0.96   Types: Cigarettes   Quit date: 11/24/1965   Years since quitting: 55.1  Smokeless Tobacco Never   Counseling given: Not Answered   Continue to not smoke  Outpatient Encounter Medications as of 12/23/2020  Medication Sig   acetaminophen (TYLENOL) 500 MG tablet Take 500 mg by mouth daily as  needed for mild pain.   ELIQUIS 5 MG TABS tablet TAKE 1 TABLET(5 MG) BY MOUTH TWICE DAILY   furosemide (LASIX) 20 MG tablet Take 20 mg by mouth daily.   lidocaine (LIDODERM) 5 % 1 patch daily.   Multiple Vitamin (MULTIVITAMIN WITH MINERALS) TABS tablet Take 1 tablet by mouth daily.   Multiple Vitamins-Minerals (EYE VITAMINS) CAPS Take 1 capsule by mouth daily.    pantoprazole (PROTONIX) 40 MG tablet Take 40 mg daily by mouth.   Propylene Glycol (SYSTANE BALANCE OP) Place 1 drop into both eyes daily as  needed (dry eyes).   rosuvastatin (CRESTOR) 10 MG tablet Take 10 mg by mouth every morning.   sodium chloride (OCEAN) 0.65 % SOLN nasal spray Place 1 spray into both nostrils daily.   tamsulosin (FLOMAX) 0.4 MG CAPS capsule TAKE 1 CAPSULE(0.4 MG) BY MOUTH DAILY AFTER AND SUPPER   triamcinolone cream (KENALOG) 0.1 % SMARTSIG:1 Application Topical 2-3 Times Daily   No facility-administered encounter medications on file as of 12/23/2020.     Review of Systems  Review of Systems  N/a Physical Exam  BP 120/76   Pulse 60   Temp 97.9 F (36.6 C)   Ht 6\' 2"  (1.88 m)   Wt 207 lb 6.4 oz (94.1 kg)   SpO2 98%   BMI 26.63 kg/m   Wt Readings from Last 5 Encounters:  12/23/20 207 lb 6.4 oz (94.1 kg)  11/05/20 208 lb 6.4 oz (94.5 kg)  09/25/20 200 lb 9.6 oz (91 kg)  07/18/20 198 lb (89.8 kg)  06/05/20 198 lb 3.1 oz (89.9 kg)    BMI Readings from Last 5 Encounters:  12/23/20 26.63 kg/m  11/05/20 26.76 kg/m  09/25/20 25.76 kg/m  07/18/20 25.42 kg/m  06/05/20 25.45 kg/m     Physical Exam General: Well-appearing, no distress Eyes: EOMI, no icterus Neck: Supple, no JVD Pulmonary: Clear, normal work of breathing Cardiovascular: Irregularly irregular, regular rate Abdomen: Nondistended, bowel sounds present MSK: No synovitis, no joint effusion Neuro: Normal gait, no weakness Psych: Normal, full affect   Assessment & Plan:   Improving right lung infiltrate: initially seen CT  scan 05/2020 in the setting of clinical aspiration with his SBO and vomiting.  To my eye serial improvement but persistence of right lung infiltrate on subsequent chest x-rays in the interim.  Suspect related to fibrosis from infection, aspiration.  Repeat CT chest 10/18/2020 with improved overall right lower lobe with newly discovered nodule scattered throughout the right most notably right middle lobe.  The appearance is atypical for malignancy.  Interval follow-up scan 12/2020 with further improvement of right lower lobe infiltrates as well as improvement in size of nodular right middle lobe infiltrates.  High suspicion are related to massive aspiration event during hospitalization 05/2020.  No need for further evaluation.  Dyspnea on exertion: Suspect related deconditioning after radiation and hospitalization in March.  He reports relatively good cardiovascular tolerance given description of his elliptical workouts.  This is very reassuring.  Can consider PFTs in the future.  Notably they were normal in 2018.  Echocardiogram 07/2020 with dilated left atrium, suspect some element of cardiac contribution.  He exercises routinely, discussed and plan for graduated exercise regimen to help improve cardiovascular endurance.   Return if symptoms worsen or fail to improve.   Lanier Clam, MD 12/23/2020   I spent 33 minutes in care of the patient this encounter including face-to-face visit, review of records, coordination of care.

## 2021-01-07 DIAGNOSIS — Z23 Encounter for immunization: Secondary | ICD-10-CM | POA: Diagnosis not present

## 2021-01-23 DIAGNOSIS — M5430 Sciatica, unspecified side: Secondary | ICD-10-CM | POA: Diagnosis not present

## 2021-01-28 DIAGNOSIS — M5430 Sciatica, unspecified side: Secondary | ICD-10-CM | POA: Diagnosis not present

## 2021-02-03 DIAGNOSIS — M5430 Sciatica, unspecified side: Secondary | ICD-10-CM | POA: Diagnosis not present

## 2021-02-05 ENCOUNTER — Other Ambulatory Visit (HOSPITAL_COMMUNITY): Payer: Self-pay | Admitting: Internal Medicine

## 2021-02-05 DIAGNOSIS — M545 Low back pain, unspecified: Secondary | ICD-10-CM

## 2021-02-05 DIAGNOSIS — M5416 Radiculopathy, lumbar region: Secondary | ICD-10-CM | POA: Diagnosis not present

## 2021-02-05 DIAGNOSIS — M5430 Sciatica, unspecified side: Secondary | ICD-10-CM | POA: Diagnosis not present

## 2021-02-06 ENCOUNTER — Emergency Department (HOSPITAL_COMMUNITY): Payer: Medicare Other

## 2021-02-06 ENCOUNTER — Encounter (HOSPITAL_COMMUNITY): Payer: Self-pay | Admitting: Emergency Medicine

## 2021-02-06 ENCOUNTER — Inpatient Hospital Stay (HOSPITAL_COMMUNITY)
Admission: EM | Admit: 2021-02-06 | Discharge: 2021-02-12 | DRG: 478 | Disposition: A | Discharge status: Rehabilitation Facility | Payer: Medicare Other | Attending: Family Medicine | Admitting: Family Medicine

## 2021-02-06 ENCOUNTER — Other Ambulatory Visit: Payer: Self-pay

## 2021-02-06 DIAGNOSIS — R109 Unspecified abdominal pain: Secondary | ICD-10-CM | POA: Diagnosis not present

## 2021-02-06 DIAGNOSIS — S32401A Unspecified fracture of right acetabulum, initial encounter for closed fracture: Secondary | ICD-10-CM | POA: Diagnosis not present

## 2021-02-06 DIAGNOSIS — F40240 Claustrophobia: Secondary | ICD-10-CM | POA: Diagnosis present

## 2021-02-06 DIAGNOSIS — M4856XD Collapsed vertebra, not elsewhere classified, lumbar region, subsequent encounter for fracture with routine healing: Secondary | ICD-10-CM | POA: Diagnosis present

## 2021-02-06 DIAGNOSIS — E7800 Pure hypercholesterolemia, unspecified: Secondary | ICD-10-CM | POA: Diagnosis present

## 2021-02-06 DIAGNOSIS — K5900 Constipation, unspecified: Secondary | ICD-10-CM | POA: Diagnosis present

## 2021-02-06 DIAGNOSIS — Z8546 Personal history of malignant neoplasm of prostate: Secondary | ICD-10-CM

## 2021-02-06 DIAGNOSIS — M4856XA Collapsed vertebra, not elsewhere classified, lumbar region, initial encounter for fracture: Secondary | ICD-10-CM | POA: Diagnosis not present

## 2021-02-06 DIAGNOSIS — S32402A Unspecified fracture of left acetabulum, initial encounter for closed fracture: Secondary | ICD-10-CM | POA: Diagnosis present

## 2021-02-06 DIAGNOSIS — R530 Neoplastic (malignant) related fatigue: Secondary | ICD-10-CM | POA: Diagnosis present

## 2021-02-06 DIAGNOSIS — S32050A Wedge compression fracture of fifth lumbar vertebra, initial encounter for closed fracture: Secondary | ICD-10-CM | POA: Diagnosis not present

## 2021-02-06 DIAGNOSIS — Z8719 Personal history of other diseases of the digestive system: Secondary | ICD-10-CM

## 2021-02-06 DIAGNOSIS — D649 Anemia, unspecified: Secondary | ICD-10-CM | POA: Diagnosis not present

## 2021-02-06 DIAGNOSIS — R918 Other nonspecific abnormal finding of lung field: Secondary | ICD-10-CM | POA: Diagnosis not present

## 2021-02-06 DIAGNOSIS — D72829 Elevated white blood cell count, unspecified: Secondary | ICD-10-CM | POA: Diagnosis present

## 2021-02-06 DIAGNOSIS — J8410 Pulmonary fibrosis, unspecified: Secondary | ICD-10-CM | POA: Diagnosis present

## 2021-02-06 DIAGNOSIS — M4316 Spondylolisthesis, lumbar region: Secondary | ICD-10-CM | POA: Diagnosis not present

## 2021-02-06 DIAGNOSIS — I4821 Permanent atrial fibrillation: Secondary | ICD-10-CM | POA: Diagnosis present

## 2021-02-06 DIAGNOSIS — Z800 Family history of malignant neoplasm of digestive organs: Secondary | ICD-10-CM | POA: Diagnosis not present

## 2021-02-06 DIAGNOSIS — Z823 Family history of stroke: Secondary | ICD-10-CM

## 2021-02-06 DIAGNOSIS — Z950 Presence of cardiac pacemaker: Secondary | ICD-10-CM | POA: Diagnosis not present

## 2021-02-06 DIAGNOSIS — M479 Spondylosis, unspecified: Secondary | ICD-10-CM | POA: Diagnosis present

## 2021-02-06 DIAGNOSIS — M19041 Primary osteoarthritis, right hand: Secondary | ICD-10-CM | POA: Diagnosis present

## 2021-02-06 DIAGNOSIS — E559 Vitamin D deficiency, unspecified: Secondary | ICD-10-CM | POA: Diagnosis not present

## 2021-02-06 DIAGNOSIS — S32512A Fracture of superior rim of left pubis, initial encounter for closed fracture: Secondary | ICD-10-CM | POA: Diagnosis not present

## 2021-02-06 DIAGNOSIS — Z79899 Other long term (current) drug therapy: Secondary | ICD-10-CM

## 2021-02-06 DIAGNOSIS — Z7901 Long term (current) use of anticoagulants: Secondary | ICD-10-CM

## 2021-02-06 DIAGNOSIS — Z20822 Contact with and (suspected) exposure to covid-19: Secondary | ICD-10-CM | POA: Diagnosis not present

## 2021-02-06 DIAGNOSIS — Z87891 Personal history of nicotine dependence: Secondary | ICD-10-CM

## 2021-02-06 DIAGNOSIS — K219 Gastro-esophageal reflux disease without esophagitis: Secondary | ICD-10-CM | POA: Diagnosis present

## 2021-02-06 DIAGNOSIS — G8929 Other chronic pain: Secondary | ICD-10-CM | POA: Diagnosis present

## 2021-02-06 DIAGNOSIS — M25551 Pain in right hip: Secondary | ICD-10-CM | POA: Diagnosis not present

## 2021-02-06 DIAGNOSIS — S32059A Unspecified fracture of fifth lumbar vertebra, initial encounter for closed fracture: Secondary | ICD-10-CM | POA: Diagnosis not present

## 2021-02-06 DIAGNOSIS — Z923 Personal history of irradiation: Secondary | ICD-10-CM | POA: Diagnosis not present

## 2021-02-06 DIAGNOSIS — C7951 Secondary malignant neoplasm of bone: Secondary | ICD-10-CM | POA: Diagnosis not present

## 2021-02-06 DIAGNOSIS — Z885 Allergy status to narcotic agent status: Secondary | ICD-10-CM | POA: Diagnosis not present

## 2021-02-06 DIAGNOSIS — Z419 Encounter for procedure for purposes other than remedying health state, unspecified: Secondary | ICD-10-CM

## 2021-02-06 DIAGNOSIS — R509 Fever, unspecified: Secondary | ICD-10-CM | POA: Diagnosis not present

## 2021-02-06 DIAGNOSIS — C61 Malignant neoplasm of prostate: Secondary | ICD-10-CM | POA: Diagnosis not present

## 2021-02-06 DIAGNOSIS — Z8249 Family history of ischemic heart disease and other diseases of the circulatory system: Secondary | ICD-10-CM | POA: Diagnosis not present

## 2021-02-06 DIAGNOSIS — M8458XA Pathological fracture in neoplastic disease, other specified site, initial encounter for fracture: Secondary | ICD-10-CM | POA: Diagnosis not present

## 2021-02-06 DIAGNOSIS — I495 Sick sinus syndrome: Secondary | ICD-10-CM | POA: Diagnosis not present

## 2021-02-06 DIAGNOSIS — M19042 Primary osteoarthritis, left hand: Secondary | ICD-10-CM | POA: Diagnosis present

## 2021-02-06 DIAGNOSIS — S32050G Wedge compression fracture of fifth lumbar vertebra, subsequent encounter for fracture with delayed healing: Secondary | ICD-10-CM | POA: Diagnosis not present

## 2021-02-06 DIAGNOSIS — M549 Dorsalgia, unspecified: Secondary | ICD-10-CM | POA: Diagnosis present

## 2021-02-06 DIAGNOSIS — R0902 Hypoxemia: Secondary | ICD-10-CM | POA: Diagnosis not present

## 2021-02-06 DIAGNOSIS — I4819 Other persistent atrial fibrillation: Secondary | ICD-10-CM | POA: Diagnosis present

## 2021-02-06 DIAGNOSIS — M545 Low back pain, unspecified: Secondary | ICD-10-CM | POA: Diagnosis not present

## 2021-02-06 DIAGNOSIS — Z9889 Other specified postprocedural states: Secondary | ICD-10-CM | POA: Diagnosis not present

## 2021-02-06 DIAGNOSIS — I10 Essential (primary) hypertension: Secondary | ICD-10-CM | POA: Diagnosis not present

## 2021-02-06 DIAGNOSIS — R059 Cough, unspecified: Secondary | ICD-10-CM | POA: Diagnosis not present

## 2021-02-06 DIAGNOSIS — R29890 Loss of height: Secondary | ICD-10-CM | POA: Diagnosis not present

## 2021-02-06 DIAGNOSIS — Z888 Allergy status to other drugs, medicaments and biological substances status: Secondary | ICD-10-CM | POA: Diagnosis not present

## 2021-02-06 DIAGNOSIS — E785 Hyperlipidemia, unspecified: Secondary | ICD-10-CM | POA: Diagnosis present

## 2021-02-06 DIAGNOSIS — E871 Hypo-osmolality and hyponatremia: Secondary | ICD-10-CM | POA: Diagnosis not present

## 2021-02-06 LAB — COMPREHENSIVE METABOLIC PANEL
ALT: 24 U/L (ref 0–44)
AST: 26 U/L (ref 15–41)
Albumin: 3.7 g/dL (ref 3.5–5.0)
Alkaline Phosphatase: 91 U/L (ref 38–126)
Anion gap: 9 (ref 5–15)
BUN: 15 mg/dL (ref 8–23)
CO2: 25 mmol/L (ref 22–32)
Calcium: 10.1 mg/dL (ref 8.9–10.3)
Chloride: 103 mmol/L (ref 98–111)
Creatinine, Ser: 0.77 mg/dL (ref 0.61–1.24)
GFR, Estimated: >60 mL/min (ref >60)
Glucose, Bld: 108 mg/dL — ABNORMAL HIGH (ref 70–99)
Potassium: 3.6 mmol/L (ref 3.5–5.1)
Sodium: 137 mmol/L (ref 135–145)
Total Bilirubin: 1.5 mg/dL — ABNORMAL HIGH (ref 0.3–1.2)
Total Protein: 6.5 g/dL (ref 6.5–8.1)

## 2021-02-06 LAB — CBC WITH DIFFERENTIAL/PLATELET
Abs Immature Granulocytes: 0.14 10*3/uL — ABNORMAL HIGH (ref 0.00–0.07)
Basophils Absolute: 0 10*3/uL (ref 0.0–0.1)
Basophils Relative: 0 %
Eosinophils Absolute: 0 10*3/uL (ref 0.0–0.5)
Eosinophils Relative: 0 %
HCT: 36.5 % — ABNORMAL LOW (ref 39.0–52.0)
Hemoglobin: 12.1 g/dL — ABNORMAL LOW (ref 13.0–17.0)
Immature Granulocytes: 1 %
Lymphocytes Relative: 2 %
Lymphs Abs: 0.4 10*3/uL — ABNORMAL LOW (ref 0.7–4.0)
MCH: 31 pg (ref 26.0–34.0)
MCHC: 33.2 g/dL (ref 30.0–36.0)
MCV: 93.6 fL (ref 80.0–100.0)
Monocytes Absolute: 0.8 10*3/uL (ref 0.1–1.0)
Monocytes Relative: 5 %
Neutro Abs: 13.7 10*3/uL — ABNORMAL HIGH (ref 1.7–7.7)
Neutrophils Relative %: 92 %
Platelets: 231 10*3/uL (ref 150–400)
RBC: 3.9 MIL/uL — ABNORMAL LOW (ref 4.22–5.81)
RDW: 14.3 % (ref 11.5–15.5)
WBC: 15 10*3/uL — ABNORMAL HIGH (ref 4.0–10.5)
nRBC: 0 % (ref 0.0–0.2)

## 2021-02-06 LAB — PROTIME-INR
INR: 1 (ref 0.8–1.2)
Prothrombin Time: 13.6 seconds (ref 11.4–15.2)

## 2021-02-06 LAB — TYPE AND SCREEN
ABO/RH(D): O POS
Antibody Screen: NEGATIVE

## 2021-02-06 LAB — URINALYSIS, ROUTINE W REFLEX MICROSCOPIC
Bilirubin Urine: NEGATIVE
Glucose, UA: NEGATIVE mg/dL
Hgb urine dipstick: NEGATIVE
Ketones, ur: NEGATIVE mg/dL
Leukocytes,Ua: NEGATIVE
Nitrite: NEGATIVE
Protein, ur: NEGATIVE mg/dL
Specific Gravity, Urine: 1.01 (ref 1.005–1.030)
pH: 7 (ref 5.0–8.0)

## 2021-02-06 LAB — RESP PANEL BY RT-PCR (FLU A&B, COVID) ARPGX2
Influenza A by PCR: NEGATIVE
Influenza B by PCR: NEGATIVE
SARS Coronavirus 2 by RT PCR: NEGATIVE

## 2021-02-06 LAB — LIPASE, BLOOD: Lipase: 31 U/L (ref 11–51)

## 2021-02-06 LAB — ABO/RH: ABO/RH(D): O POS

## 2021-02-06 IMAGING — CT CT ABD-PELV W/ CM
2 of 5 series · 16 of 46 positions shown, 18 images · IV contrast (Omni 300)
Comparison: [DATE]

CLINICAL DATA: Abdominal pain

EXAM:
CT ABDOMEN AND PELVIS WITH CONTRAST
TECHNIQUE: Multidetector CT imaging of the abdomen and pelvis was performed
using the standard protocol following bolus administration of
intravenous contrast.
CONTRAST:  100mL OMNIPAQUE IOHEXOL 300 MG/ML  SOLN

[Series 3: a/p w/ 5mm · axial · 0.98mm/px · z∈[+841,+1306]mm · 13 of 105 slices shown, 15 images]
[im 6/105  soft-tissue]
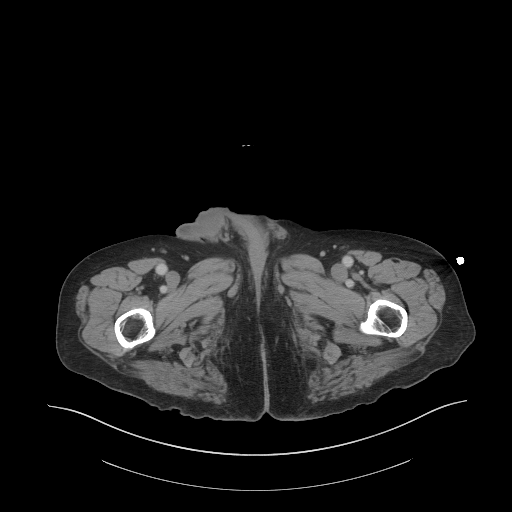
[im 6/105  bone]
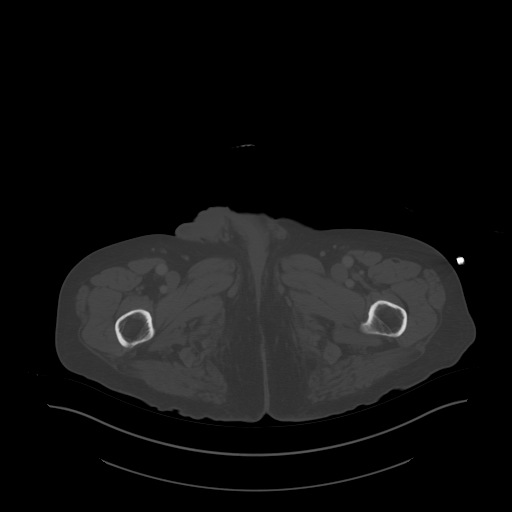
[im 12/105  soft-tissue]
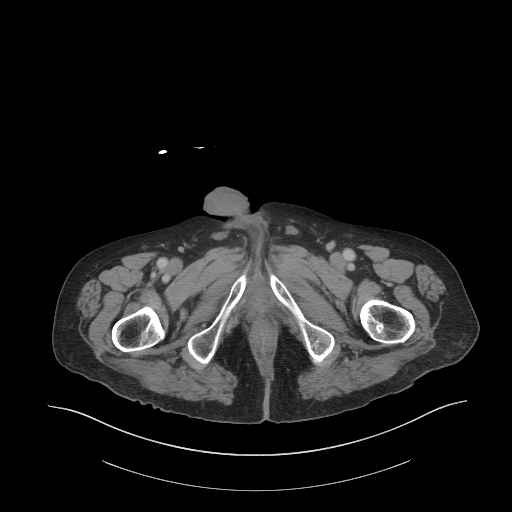
[im 24/105  soft-tissue]
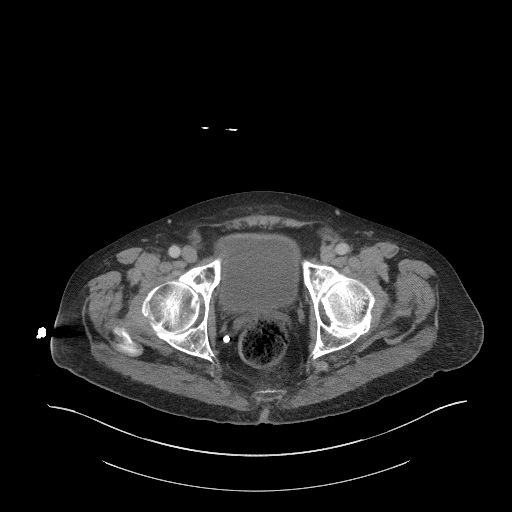
[im 29/105  soft-tissue]
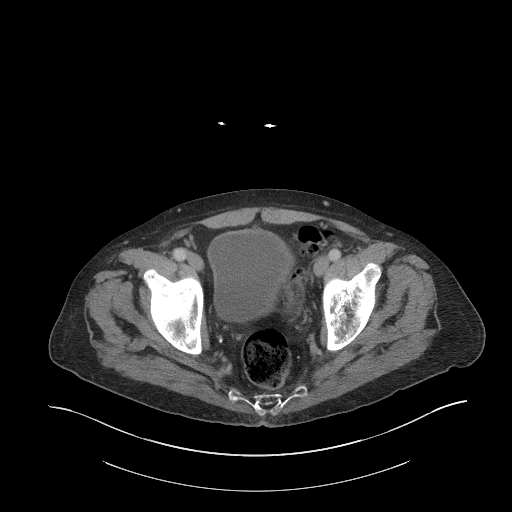
[im 35/105  soft-tissue]
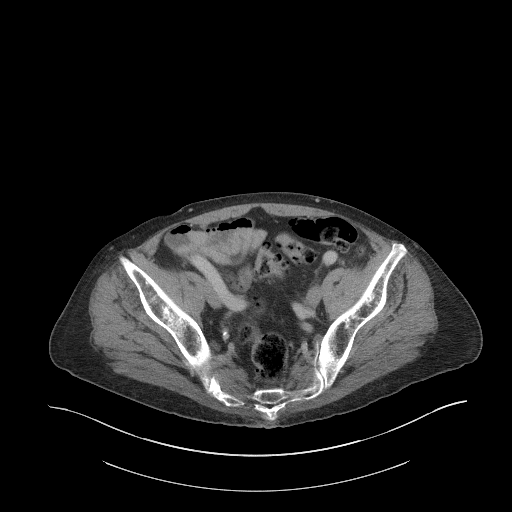
[im 47/105  soft-tissue]
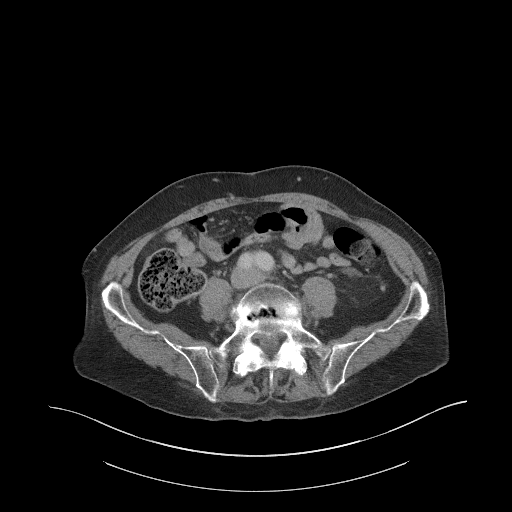
[im 53/105  soft-tissue]
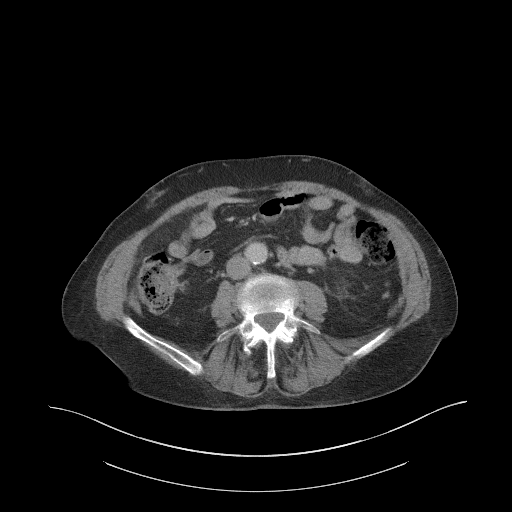
[im 58/105  soft-tissue]
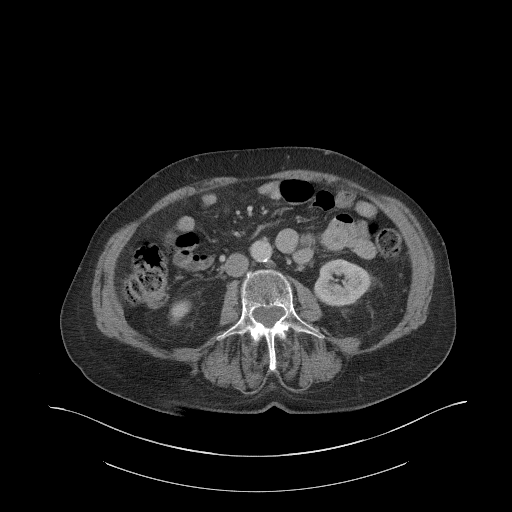
[im 70/105  soft-tissue]
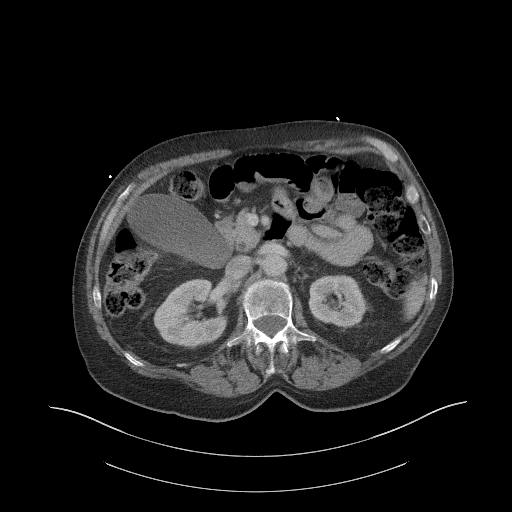
[im 70/105  bone]
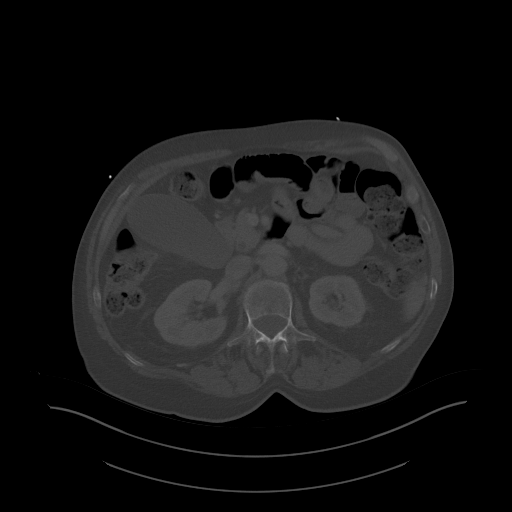
[im 76/105  soft-tissue]
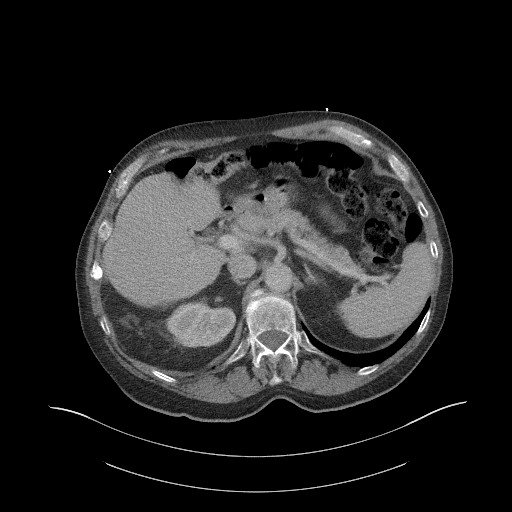
[im 81/105  soft-tissue]
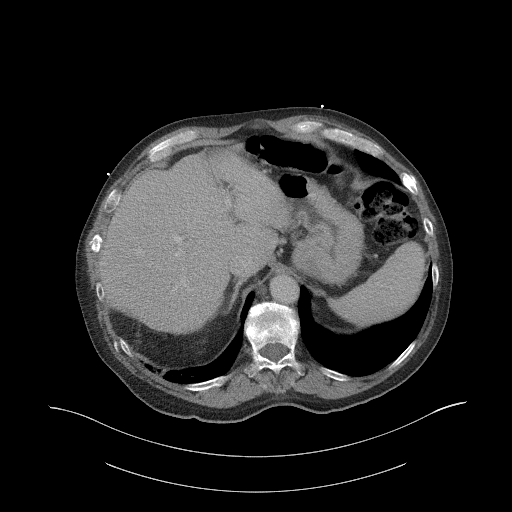
[im 93/105  soft-tissue]
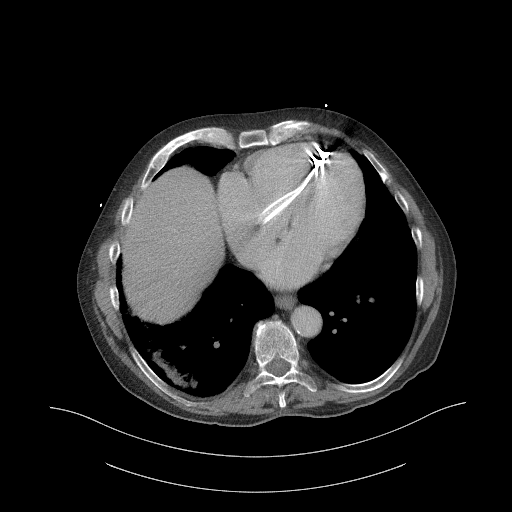
[im 99/105  soft-tissue]
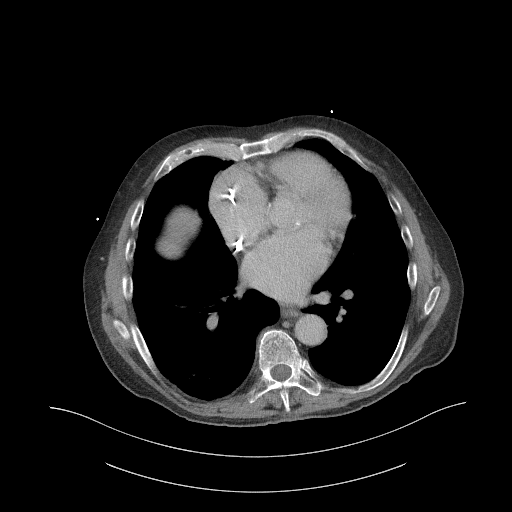

[Series 5: a/p w/ cor · coronal · 1.02mm/px · 3 of 162 slices shown]
[im 54/162  soft-tissue]
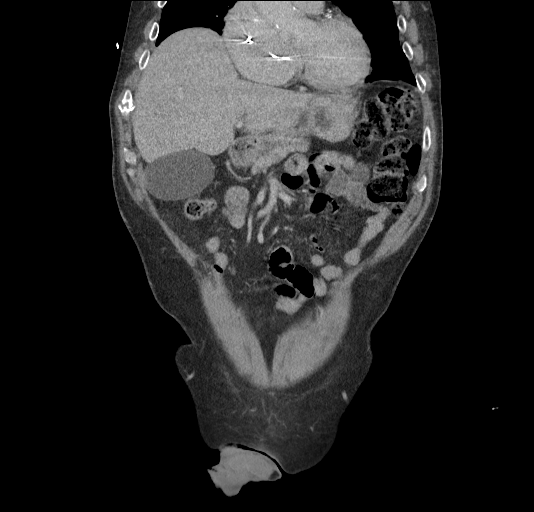
[im 72/162  soft-tissue]
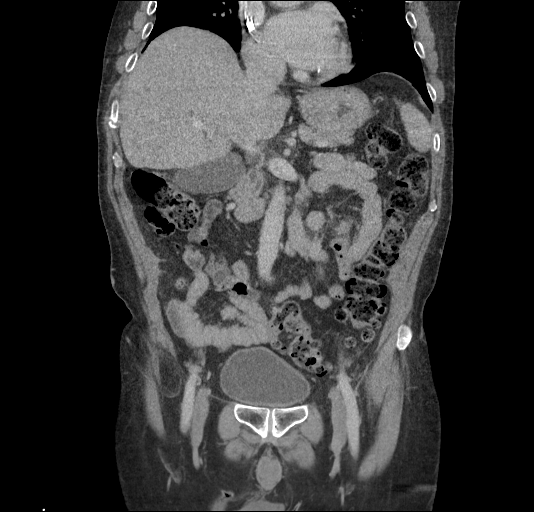
[im 90/162  soft-tissue]
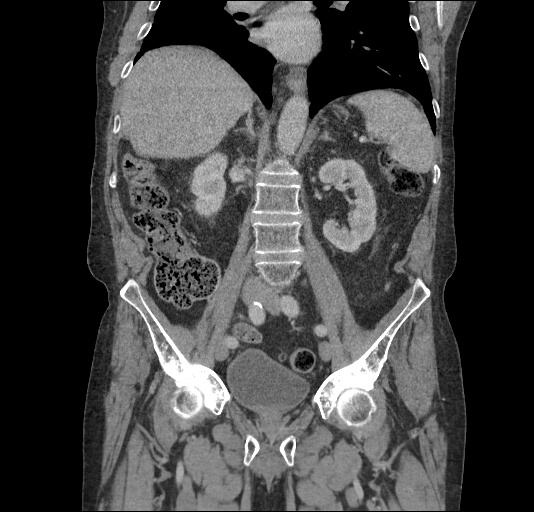

[16 of 46 positions shown; findings below may reference images not displayed]

FINDINGS: Lower chest: Patchy areas of ground-glass and airspace attenuation
identified within the right middle lobe and right lower which
appears similar to [DATE]. Small scattered lung nodules are
again noted imaged portions of the right lung.

Hepatobiliary: No focal liver abnormality. Moderate gallbladder
distension. No gallstones, gallbladder wall thickening or
pericholecystic inflammation. No significant bile duct dilatation.

Pancreas: Unremarkable. No pancreatic ductal dilatation or
surrounding inflammatory changes.

Spleen: Normal in size without focal abnormality.

Adrenals/Urinary Tract: Normal adrenal glands. The kidneys are
unremarkable. No mass, nephrolithiasis or hydronephrosis. Urinary
bladder is unremarkable.

Stomach/Bowel: Stomach appears normal. Status post appendectomy. No
pathologic dilatation of the large or small bowel loops to suggest
recurrent bowel obstruction. Distal colonic diverticula noted
without signs of acute diverticulitis.

Vascular/Lymphatic: Aortic atherosclerosis without aneurysm. No
signs of abdominopelvic adenopathy.

Reproductive: Fiducial markers identified within the prostate gland.

Other: No free fluid or fluid collections.

Musculoskeletal: The bones appear osteopenic. Degenerative disc
disease is identified at L4-5 and L5-S1. New inferior endplate
compression deformity is noted involving the L5 vertebral body,
image 119/6.
IMPRESSION: 1. No acute findings identified within the abdomen or pelvis. No
signs of recurrent bowel obstruction.
2. Ground-glass and airspace densities are noted within the right
middle lobe and right lower lobe with scattered small lung nodules.
Findings are similar to [DATE] and are favored to represent
sequelae of atypical infectious process.
3. New inferior endplate compression deformity involving the L5
vertebral body.
4. Aortic Atherosclerosis ([XZ]-[XZ]).

## 2021-02-06 IMAGING — DX DG CHEST 1V PORT
1 series · 1 of 1 positions shown · non-contrast
Comparison: [DATE].

CLINICAL DATA: Fever.

EXAM:
PORTABLE CHEST 1 VIEW

[chest]
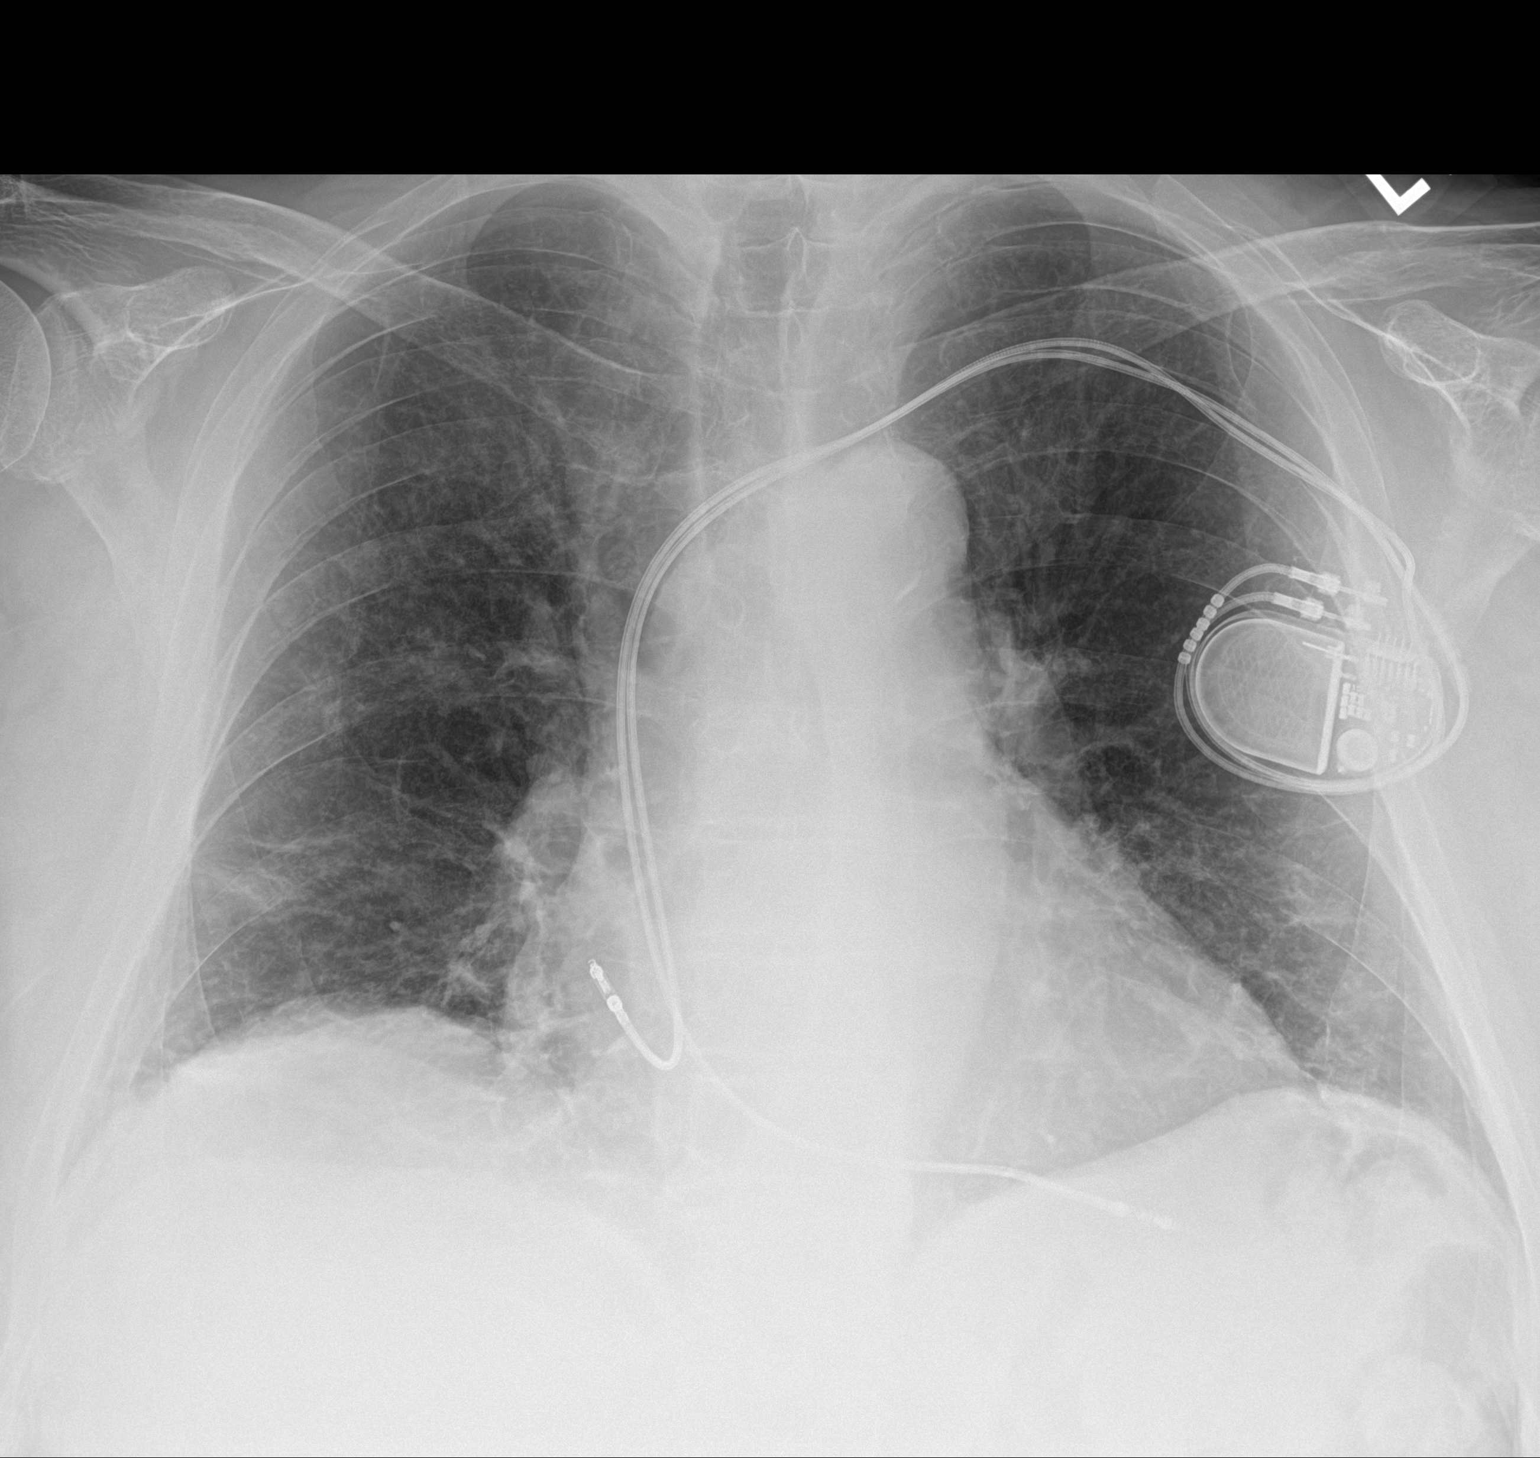

[1 of 1 positions shown; findings below may reference images not displayed]

FINDINGS: Stable cardiomediastinal silhouette. Left-sided pacemaker is
unchanged in position. Decreased right basilar opacity is noted
suggesting improving atelectasis or infiltrate left lung is clear.
Bony thorax is unremarkable.
IMPRESSION: Improved right basilar opacity as described above.

## 2021-02-06 IMAGING — CT CT L SPINE W/O CM
4 series · 15 of 33 positions shown, 18 images · IV contrast (Omni 300)
Comparison: Correlation made with CT abdomen [DATE]

CLINICAL DATA: Low back pain

EXAM:
CT LUMBAR SPINE WITHOUT CONTRAST
TECHNIQUE: Multidetector CT imaging of the lumbar spine was performed without
intravenous contrast administration. Multiplanar CT image
reconstructions were also generated.

[Series 1: l spine st · axial · 0.41mm/px · z∈[+1010,+1162]mm · 5 of 115 slices shown, 7 images]
[im 20/115  soft-tissue]
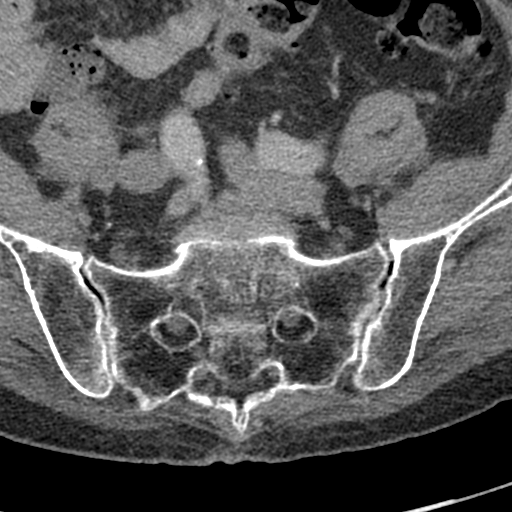
[im 20/115  bone]
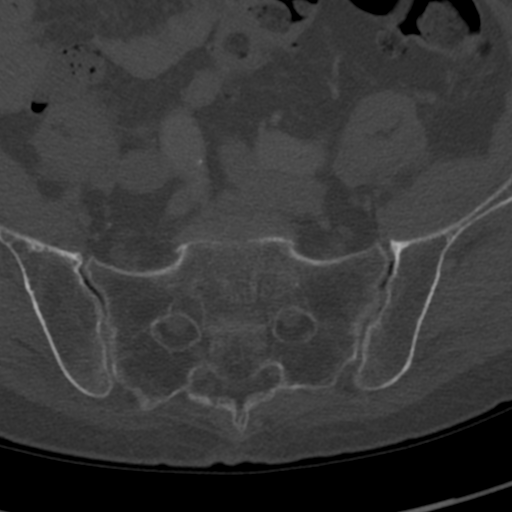
[im 39/115  bone]
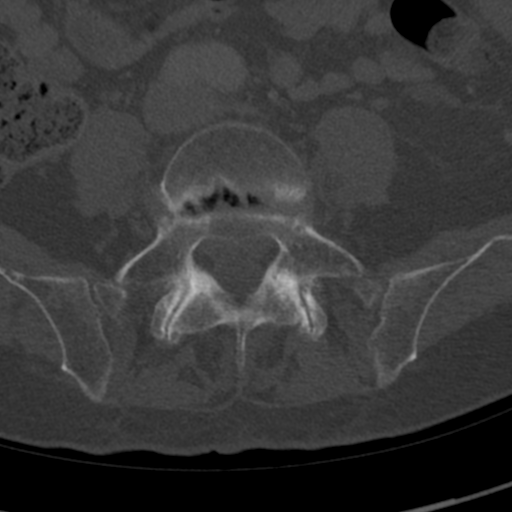
[im 58/115  bone]
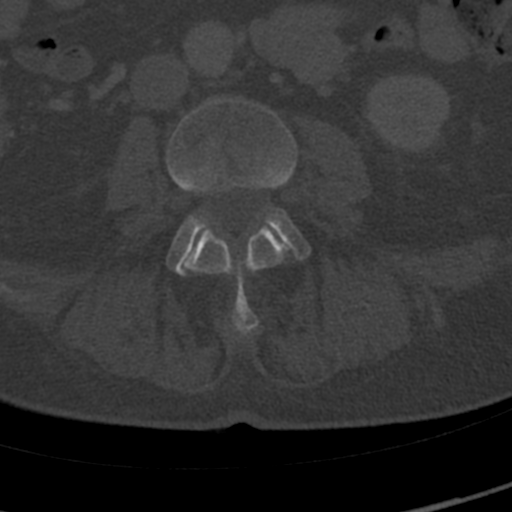
[im 77/115  bone]
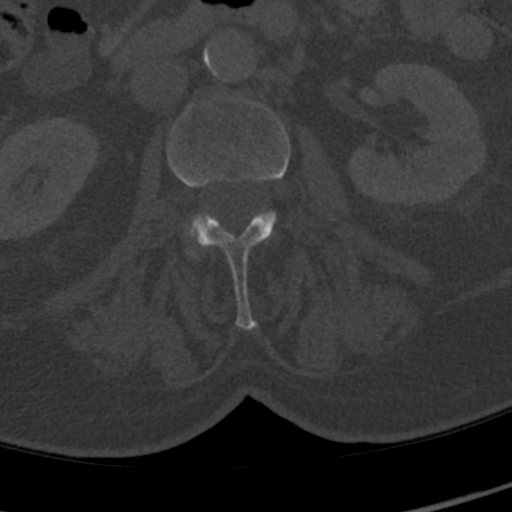
[im 96/115  soft-tissue]
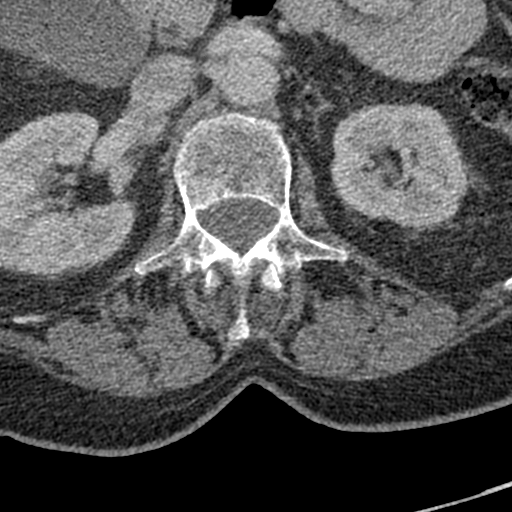
[im 96/115  bone]
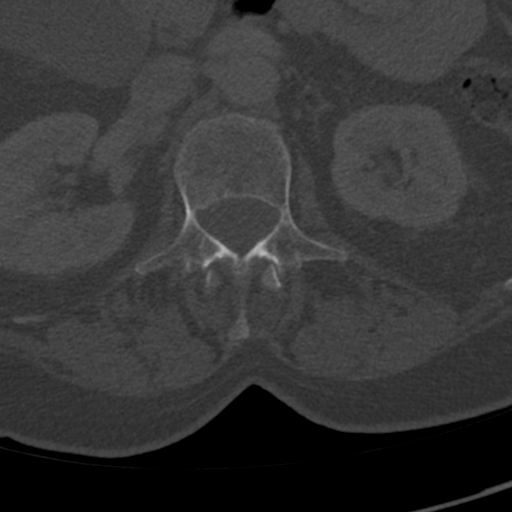

[Series 5: l spine cor · coronal · 0.48mm/px · 3 of 105 slices shown]
[im 21/105  bone]
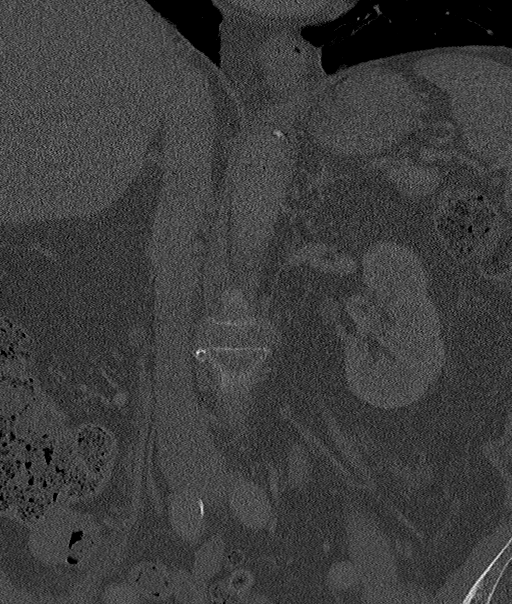
[im 42/105  bone]
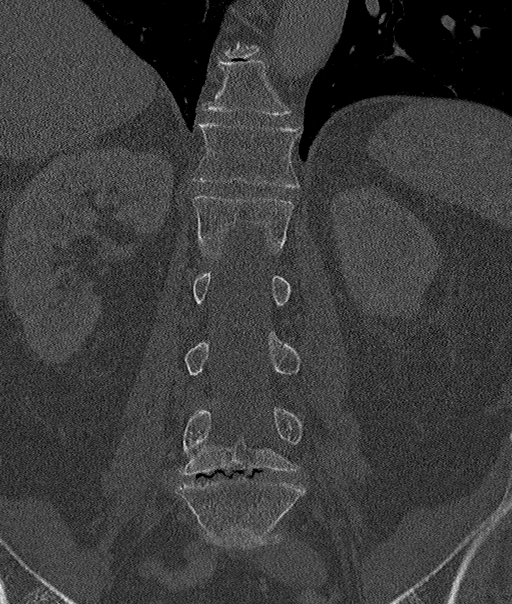
[im 63/105  bone]
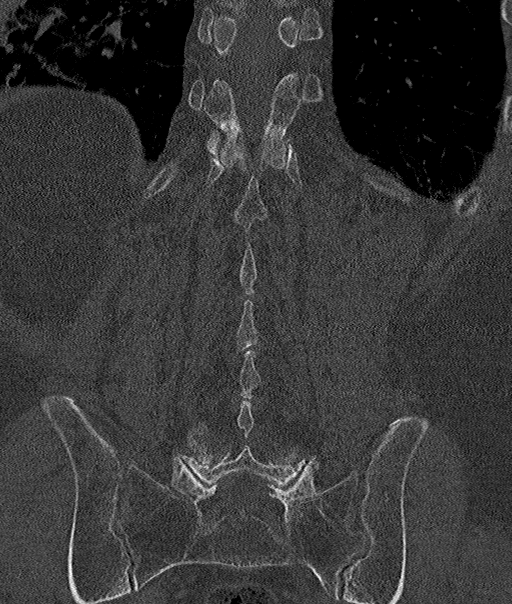

[Series 6: l spine sag · sagittal · 0.53mm/px · 5 of 123 slices shown, 6 images]
[im 41/123  bone]
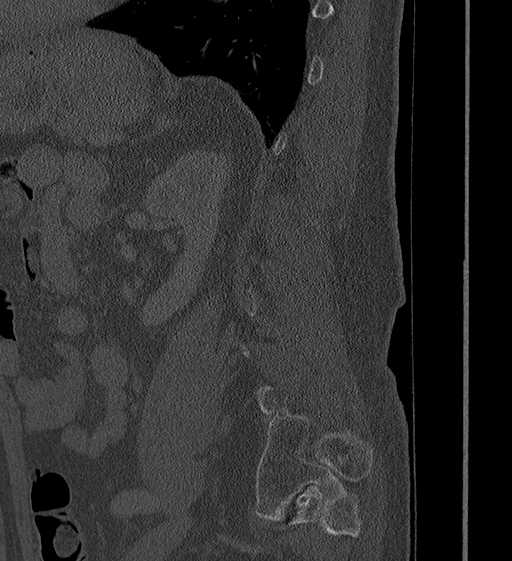
[im 51/123  bone]
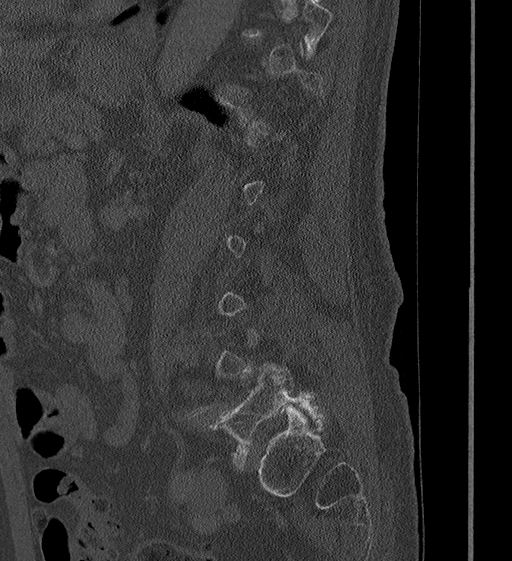
[im 62/123  soft-tissue]
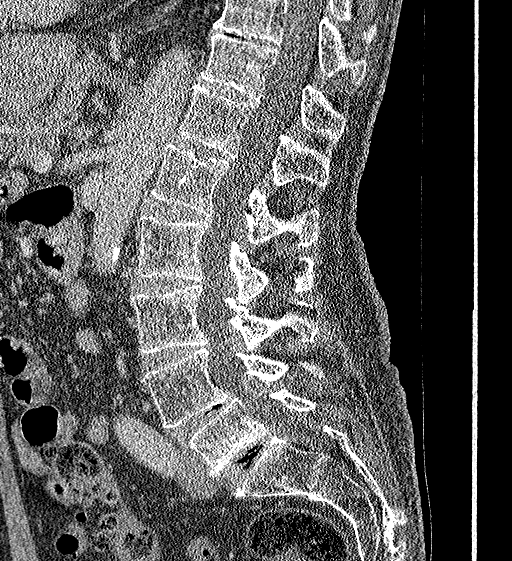
[im 62/123  bone]
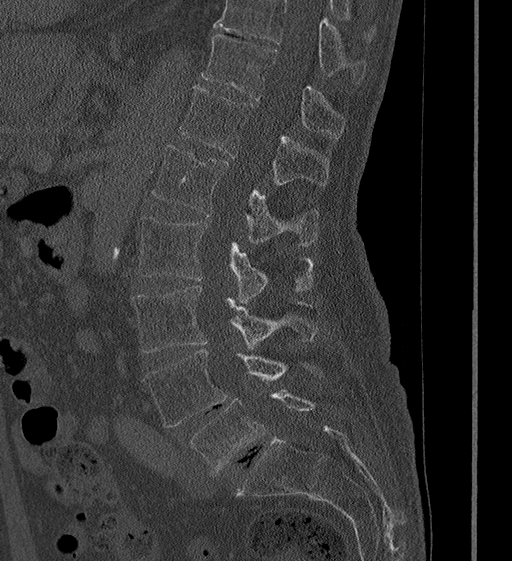
[im 72/123  bone]
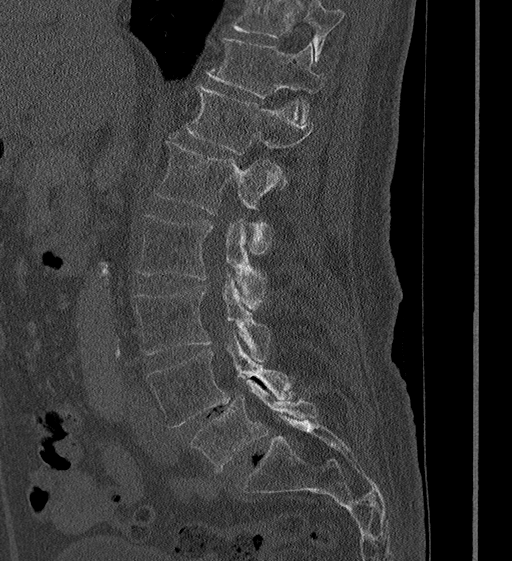
[im 82/123  bone]
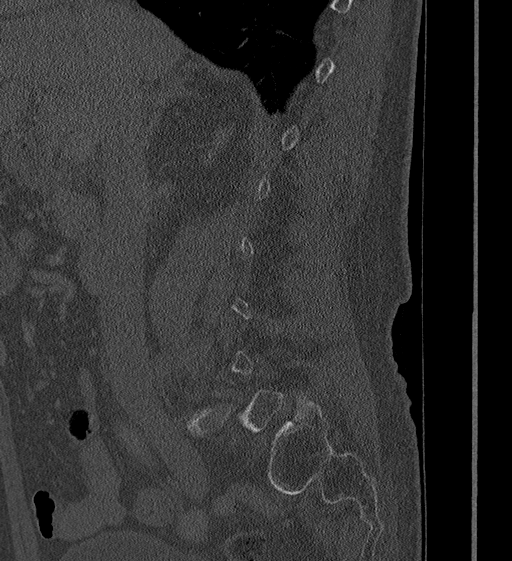

[Series 7: l spine axial · axial · 0.41mm/px · z∈[+1011,+1053]mm · 2 of 125 slices shown]
[im 21/125  bone]
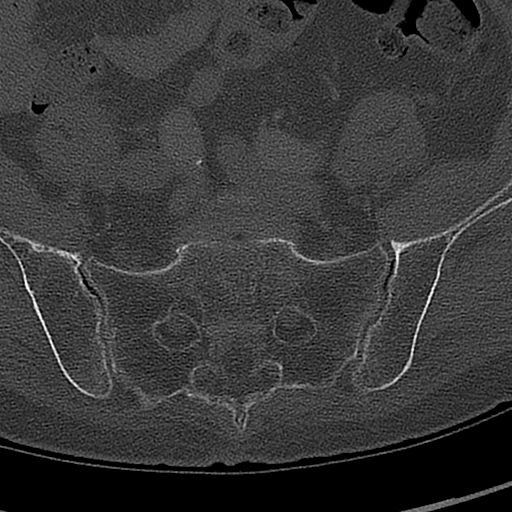
[im 42/125  bone]
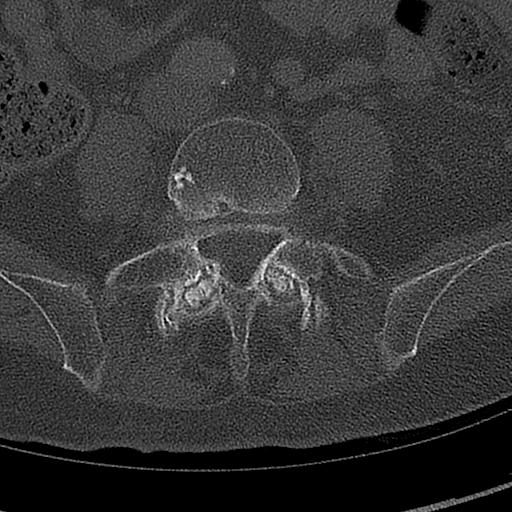

[15 of 33 positions shown; findings below may reference images not displayed]

FINDINGS: Segmentation: 5 lumbar type vertebrae.

Alignment: Similar grade 1 anterolisthesis at L4-L5.

Vertebrae: There is new loss of height at the inferior endplate of
L5. Height loss is less than 50%. Chronic large Schmorl's node of
the L1 superior endplate.

Paraspinal and other soft tissues: Extra-spinal findings are better
evaluated on concurrent dedicated imaging.

Disc levels: Multilevel disc space narrowing. Vacuum disc phenomena
at L4-L5 and L5-S1. Facet hypertrophy is greatest at L4-L5 and
L5-S1. There is no high-grade canal narrowing identified. Mild
foraminal narrowing at lower lumbar levels.
IMPRESSION: Age-indeterminate compression fracture at the inferior endplate of
L5 with less than 50% loss height (new since [DATE]).

Degenerative changes without high-grade stenosis. Lower lumbar facet
degeneration.

## 2021-02-06 MED ORDER — OXYCODONE HCL 5 MG PO TABS
5.0000 mg | ORAL_TABLET | Freq: Four times a day (QID) | ORAL | Status: DC | PRN
  Administered 2021-02-06 – 2021-02-07 (×2): 5 mg via ORAL
  Filled 2021-02-06 (×2): qty 1

## 2021-02-06 MED ORDER — SODIUM CHLORIDE 0.9 % IV BOLUS
1000.0000 mL | Freq: Once | INTRAVENOUS | Status: AC
  Administered 2021-02-06: 1000 mL via INTRAVENOUS

## 2021-02-06 MED ORDER — ACETAMINOPHEN 500 MG PO TABS
1000.0000 mg | ORAL_TABLET | Freq: Once | ORAL | Status: AC
  Administered 2021-02-06: 1000 mg via ORAL
  Filled 2021-02-06: qty 2

## 2021-02-06 MED ORDER — LORAZEPAM 1 MG PO TABS: 0.5000 mg | ORAL_TABLET | ORAL | Status: DC | PRN

## 2021-02-06 MED ORDER — CALCIUM CARBONATE ANTACID 500 MG PO CHEW
2.0000 | CHEWABLE_TABLET | Freq: Every day | ORAL | Status: DC | PRN
  Administered 2021-02-11: 400 mg via ORAL
  Filled 2021-02-06: qty 2

## 2021-02-06 MED ORDER — ACETAMINOPHEN 325 MG PO TABS
650.0000 mg | ORAL_TABLET | Freq: Once | ORAL | Status: AC
  Administered 2021-02-06: 650 mg via ORAL
  Filled 2021-02-06: qty 2

## 2021-02-06 MED ORDER — POLYETHYLENE GLYCOL 3350 17 G PO PACK
17.0000 g | PACK | Freq: Every day | ORAL | Status: DC
  Administered 2021-02-07 – 2021-02-12 (×6): 17 g via ORAL
  Filled 2021-02-06 (×6): qty 1

## 2021-02-06 MED ORDER — HYDROMORPHONE HCL 1 MG/ML IJ SOLN
1.0000 mg | Freq: Once | INTRAMUSCULAR | Status: AC
  Administered 2021-02-06: 1 mg via INTRAVENOUS
  Filled 2021-02-06: qty 1

## 2021-02-06 MED ORDER — LORAZEPAM 2 MG/ML IJ SOLN: 0.5000 mg | INTRAMUSCULAR | Status: DC | PRN

## 2021-02-06 MED ORDER — POLYVINYL ALCOHOL 1.4 % OP SOLN
1.0000 [drp] | OPHTHALMIC | Status: DC | PRN
  Administered 2021-02-09: 09:00:00 1 [drp] via OPHTHALMIC
  Filled 2021-02-06: qty 15

## 2021-02-06 MED ORDER — SALINE SPRAY 0.65 % NA SOLN: 1.0000 | Freq: Every day | NASAL | Status: DC | PRN

## 2021-02-06 MED ORDER — APIXABAN 5 MG PO TABS
5.0000 mg | ORAL_TABLET | Freq: Two times a day (BID) | ORAL | Status: DC
  Administered 2021-02-07: 5 mg via ORAL
  Filled 2021-02-06: qty 1

## 2021-02-06 MED ORDER — FUROSEMIDE 20 MG PO TABS
20.0000 mg | ORAL_TABLET | Freq: Every day | ORAL | Status: DC
  Administered 2021-02-07 – 2021-02-12 (×6): 20 mg via ORAL
  Filled 2021-02-06 (×6): qty 1

## 2021-02-06 MED ORDER — ROSUVASTATIN CALCIUM 5 MG PO TABS
10.0000 mg | ORAL_TABLET | Freq: Every morning | ORAL | Status: DC
  Administered 2021-02-07 – 2021-02-12 (×6): 10 mg via ORAL
  Filled 2021-02-06 (×7): qty 2

## 2021-02-06 MED ORDER — ONDANSETRON HCL 4 MG/2ML IJ SOLN: 4.0000 mg | Freq: Three times a day (TID) | INTRAMUSCULAR | Status: DC | PRN

## 2021-02-06 MED ORDER — PROPYLENE GLYCOL 0.6 % OP SOLN: Freq: Every day | OPHTHALMIC | Status: DC

## 2021-02-06 MED ORDER — LORAZEPAM 2 MG/ML IJ SOLN
1.0000 mg | INTRAMUSCULAR | Status: DC | PRN
  Filled 2021-02-06: qty 1

## 2021-02-06 MED ORDER — OXYCODONE HCL 5 MG PO TABS
5.0000 mg | ORAL_TABLET | Freq: Once | ORAL | Status: AC
  Administered 2021-02-06: 5 mg via ORAL
  Filled 2021-02-06: qty 1

## 2021-02-06 MED ORDER — ADULT MULTIVITAMIN W/MINERALS CH
1.0000 | ORAL_TABLET | Freq: Every day | ORAL | Status: DC
  Administered 2021-02-07 – 2021-02-12 (×6): 1 via ORAL
  Filled 2021-02-06 (×6): qty 1

## 2021-02-06 MED ORDER — IOHEXOL 300 MG/ML  SOLN
100.0000 mL | Freq: Once | INTRAMUSCULAR | Status: AC | PRN
  Administered 2021-02-06: 100 mL via INTRAVENOUS

## 2021-02-06 MED ORDER — SENNA 8.6 MG PO TABS
1.0000 | ORAL_TABLET | Freq: Every day | ORAL | Status: DC
  Administered 2021-02-07 – 2021-02-12 (×6): 8.6 mg via ORAL
  Filled 2021-02-06 (×6): qty 1

## 2021-02-06 MED ORDER — PANTOPRAZOLE SODIUM 40 MG PO TBEC
40.0000 mg | DELAYED_RELEASE_TABLET | Freq: Every day | ORAL | Status: DC
  Administered 2021-02-07 – 2021-02-12 (×6): 40 mg via ORAL
  Filled 2021-02-06 (×6): qty 1

## 2021-02-06 MED ORDER — TAMSULOSIN HCL 0.4 MG PO CAPS
0.4000 mg | ORAL_CAPSULE | Freq: Every day | ORAL | Status: DC
  Administered 2021-02-07 – 2021-02-12 (×6): 0.4 mg via ORAL
  Filled 2021-02-06 (×6): qty 1

## 2021-02-06 NOTE — ED Notes (Signed)
Patient provided with ice and ginger ale.  Medicated for pain

## 2021-02-06 NOTE — ED Provider Notes (Signed)
  Physical Exam  BP 105/67   Pulse 72   Temp 97.7 F (36.5 C) (Oral)   Resp (!) 21   SpO2 96%     ED Course/Procedures     Procedures  MDM    42M, complaining of SI joint pain, had been suggested that he present for an MRI. CT scan ordered as patient presenting with fever. If negative, consider MRI of the lumbar spine WWO. Getting Tylenol for fever. No neuro deficits.   On reassessment, the patient continued to complain of significant pain in his mid lumbar back.  He continues to deny any acute neurologic deficits or red flag symptoms at this time with the exception of fever.  He has no concerning neurologic deficits concerning for cauda equina.  Low suspicion for epidural abscess at this time.   Laboratory work-up significant for CBC with leukocytosis to 15, hemoglobin 12.1, CMP generally unremarkable, urinalysis without evidence of UTI, COVID-19 and influenza PCR negative.  The patient remained hemodynamically stable on reassessment.  The patient CT abdomen pelvis was concerning for no acute findings identified with the abdomen or pelvis, no signs of recurrent bowel obstruction, groundglass and airspace densities within the right middle lobe and right lower lobe and scattered small lung nodules present.  Favor direct sequelae of atypical infectious process as findings were similar to prior imaging in October 2022.  A new inferior endplate compression deformity involving the L5 vertebral body was noted.  Unclear acuity and etiology.  The patient denies any recent falls.  He has had low back pain for around 3 months.  Acutely worsened over the past day.  MRI of the lumbar spine with and without contrast was ordered but unable to be obtained at this time due to the patient's pacemaker and lack of available staff to perform MRI safe procedure with the patient's known pacemaker.  Informed by nursing that staff will be available in the morning.  Due to this, will plan to admit the patient for  further care and management.  Family practice management consulted for admission.      Regan Lemming, MD 02/07/21 0400

## 2021-02-06 NOTE — Hospital Course (Addendum)
Todd Chapman is a 75 y.o.male with a history of A. fib on Eliquis with pacemaker, HLD, GERD, SBO, prostate cancer status postradiation, aspiration pneumonia who was admitted to the family practice teaching Service at [facility] for back pain with concern for malignant process. His hospital course is detailed below:  L5 compression fracture  bilateral acetabular stress fractures Presented with worsening back pain, isolated T102.2 and single emesis episode. WBC 15.0. Failed prednisone taper provided by PCP. Physical exam non-concerning for neurological involvement. Of note, MRI of left hip on 11/04/2020 noted severe signal abnormality in the left superior acetabulum.  Differential considerations included severe stress reaction possibly developing insufficiency fracture versus underlying malignancy secondary to metastatic disease given patient's history of prostate cancer.  It was recommended that a follow-up MRI of the pelvis with and without IV contrast in 3 months. CT lumbar spine noted compression fracture of L5, new since March 2022. CT abdomen/pelvis supported compression deformity of L5 vertebral body. MR L spine and pelvis showed L5 compression fracture, bilateral acetabular stress fractures.  Orthopedics advised patient could follow-up in the outpatient setting for acetabular stress fractures.  Patient received kyphoplasty with neurosurgery for L5 compression fracture.  Patient was discharged to [facility].  Other chronic conditions were medically managed with home medications and formulary alternatives as necessary (HLD, GERD)  PCP Follow-up Recommendations: DEXA scan Consider bisphosphonate therapy

## 2021-02-06 NOTE — H&P (Addendum)
[facility] Admission History and Physical Service Pager: [phone]  Patient name: Todd Chapman Medical record number: [id number] Date of birth: October 01, 1945 Age: 75 y.o. Gender: male  Primary Care Provider: [name], MD Consultants: None Code Status: FULL Preferred Emergency Contact: Todd Chapman (spouse) [phone]  Chief Complaint: Back pain  Assessment and Plan: Todd Chapman is a 75 y.o. male presenting with back pain. PMH is significant for A. Fib on Eliquis with pacemaker, HLD, GERD, SBO, Prostate CA s/p radiation.  Back Pain  ?Malignant process vs. Compression fracture Patient presents with worsening back pain accompanied by single emesis episode and isolated temperature of 102.2 on arrival to the ED. Patient also has leukocytosis of 15.0 however this is in the setting of recent prednisone taper prescribed by PCP for sacroiliitis. Concern by ED provider for possible epidural abscess given worsened back pain, fever, WBC. CT lumbar spine notes compression fracture of L5 which is new since March 2022. CT abdomen/pelvis also supports compression deformity of L5 vertebral body.   MRI unable to be completed here at time of admission given patient's pacemaker.  At this time, neurological physical exam and history is appropriate and not concerning for cauda equina syndrome or spinal stenosis. Also unlikely to be inflammatory process given no improvement with steroids. Most significant on differential is malignant process 2/2 prostate CA or pain related to compression fracture. Of note, MRI of left hip on 11/04/2020 noted severe signal abnormality in the left superior acetabulum.  Differential considerations included severe stress reaction possibly developing insufficiency fracture versus underlying malignancy secondary to metastatic disease given patient's history of prostate cancer.  It was recommended that a follow-up MRI of the pelvis with and without IV  contrast in 3 months. -Admitted to [facility], attending Dr. Owens Shark, MedSurg -Vital signs per unit with pulse ox check -PT/OT eval and treat -MR lumbar spine and pelvis with and without contrast -Oxy IR 5 mg every 6 hours as needed -Senna and MiraLAX daily -CBC in a.m.   Persistent A. Fib on Eliquis w/ pacemaker Patient notes compliance on Eliquis and regular pacemaker checks.  Pacemaker was placed in 2018.  Most recently checked on 10/30/2020 noting normal device function.  Patient is in A. fib during encounter. -Cardiac monitoring continuously x12 hours -Continue Eliquis 5 mg twice daily  HLD Unable to find most recent lipid panel data.  Home medications include rosuvastatin 10 mg daily.  -Continue rosuvastatin 10 mg daily  GERD Home medications include Protonix 40 mg daily. -Continue Protonix 40 mg daily  Prostate CA s/p radiation Treated with radiation from January to March 2022.  Not currently on any medications.  Not requiring active management at this time.  Concern for metastatic disease at this time, see back pain above.  Lower extremity edema Self-reported by patient who notes that he takes Lasix daily as prescribed by PCP.  Echo 07/18/2020 with LVEF 60 to 65%, indeterminate diastolic parameters.  Patient does not appear hypervolemic in any capacity. -Continue Lasix 20 mg daily  Ground Glass Opacities in Right Middle & Lower Lobe CT abdomen/pelvis noted ground glass opacities in right middle & lower lobes with scattered small nodules. Findings are similar to or slightly improved from prior imaging several months ago. Patient followed by pulm as an outpatient who thinks likely related to fibrosis from prior infection/aspiration during hospitalization for SBO in March 2022. No concern for pneumonia at this time given lack of respiratory symptoms, imaging findings are chronic, and only single, isolated  fever. Alternate explanation for leukocytosis (recent steroid course) more  likely. -Monitor for fever or new respiratory symptoms   FEN/GI: Heart healthy Prophylaxis: Eliquis 5mg  BID  Disposition: Med Surg  History of Present Illness:  Todd Chapman is a 75 y.o. male presenting with worsened low back pain with single emesis episode and incidentally found to be febrile with leukocytosis.  Patient reports low back pain that worsened on November 13th (~2 weeks ago). He saw Dr. Delfino Chapman at [facility] previously who ordered MRI of left hip ~4 months ago. He went on an anti-inflammatory diet which coincidentally seemed to improve his pain.  He notes his pain is worse with standing and it has become increasingly difficult for him to stand due to the pain.  He has been using a walker recently.  He notes that he sits for long periods of time at work.  He saw his PCP, Dr. Inda Chapman, who diagnosed him with sacroiliitis and gave him a course of prednisone which did not improve his symptoms.  Notes that he feels his symptoms actually worsened.  He is supposed to see Dr. Arnoldo Chapman (neurosurgery) tomorrow for another MRI.  Patient notes that this morning around 9 AM he had a single episode of projectile vomiting of black, liquidy emesis.  Denies any accompanying nausea or abdominal pain.  He notes that the previous night he had a dark mushroom soup and states the emesis looked similar to it.  He felt relieved after the vomiting episode.  Denies any further emesis episodes or others in the past.  He is stooling normally without constipation or diarrhea.  During presentation to the ED he had a single recorded fever of 102.  Denies any known fevers at home.  Further, patient was diagnosed with prostate cancer and treated with radiation from January to March 2022.  He feels that he has been declining since his treatment and has been experiencing worsening back pain since then.  Denies any recent trauma.  He is generally very active and his activity ability has worsened as his back pain is  worsened.  Alcohol: 3-5 drinks in past few weeks No tobacco use, no illicit drug use  History provided by patient and spouse.  Review Of Systems: Per HPI with the following additions:   Review of Systems  Constitutional:  Positive for fever. Negative for chills.  HENT:  Negative for ear pain and trouble swallowing.   Eyes:  Negative for visual disturbance.  Gastrointestinal:  Positive for vomiting. Negative for abdominal pain, blood in stool, constipation, diarrhea and nausea.  Genitourinary:  Negative for difficulty urinating and hematuria.  Musculoskeletal:  Positive for back pain.  Neurological:  Negative for weakness, numbness and headaches.  Psychiatric/Behavioral:  Negative for confusion.     Patient Active Problem List   Diagnosis Date Noted   SBO (small bowel obstruction) ([place]) 06/01/2020   Pacemaker 05/13/2020   Malignant neoplasm of prostate ([place]) 01/09/2020   Annual physical exam 01/08/2020   Acute upper respiratory infection 01/08/2020   Asymptomatic varicose veins of lower extremity 01/08/2020   Bronchitis 01/08/2020   Cataract 01/08/2020   Constipation 01/08/2020   Diverticulosis of large intestine without perforation or abscess without bleeding 01/08/2020   Screening for gout 01/08/2020   False positive serological test for syphilis 01/08/2020   Gastro-esophageal reflux disease without esophagitis 01/08/2020   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   History of colonic polyps 01/08/2020   Impacted cerumen 01/08/2020   Insomnia 01/08/2020   Osteoarthritis 01/08/2020  Polypharmacy 01/08/2020   Pure hypercholesterolemia 01/08/2020   Rosacea 01/08/2020   Combined forms of age-related cataract of both eyes 07/13/2019   Elevated blood pressure reading 11/08/2018   Sick sinus syndrome ([place]) 02/16/2017   Persistent atrial fibrillation ([place]) 12/18/2016   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   Nonexudative age-related macular degeneration, bilateral, early dry stage  10/07/2015   Dermatochalasis of both upper eyelids 07/25/2014   Nuclear sclerosis of both eyes 07/25/2014   Bradycardia 05/29/2014   Premature atrial contractions 05/29/2014   Premature junctional contractions ([place]) 05/29/2014   Hyperlipidemia 05/29/2014   Encounter for general adult medical examination with abnormal findings 04/24/2014   Vitamin D deficiency 04/24/2014    Past Medical History: Past Medical History:  Diagnosis Date   Arthritis    "thumbs, back" (02/16/2017)   Bradycardia 02/2017   Dyspnea    with exertion   Flu 2 weeks ago   GERD (gastroesophageal reflux disease)    Hepatitis A as child   HLD (hyperlipidemia)    Persistent atrial fibrillation ([place]) 12/18/2016   Pneumonia 1999; 2018   Presence of permanent cardiac pacemaker    st jude   Prostate cancer [facility])     Past Surgical History: Past Surgical History:  Procedure Laterality Date   APPENDECTOMY     age 61   CARDIOVERSION  02/16/2017   CARDIOVERSION N/A 02/16/2017   Procedure: CARDIOVERSION;  Surgeon: Thompson Grayer, MD;  Location: [facility];  Service: Cardiovascular;  Laterality: N/A;   COLONOSCOPY WITH PROPOFOL N/A 12/13/2012   Procedure: COLONOSCOPY WITH PROPOFOL;  Surgeon: Garlan Fair, MD;  Location: [facility];  Service: Endoscopy;  Laterality: N/A;   FRACTURE SURGERY     HYDROCELE EXCISION Left 05/16/2018   Procedure: HYDROCELECTOMY ADULT;  Surgeon: [name], MD;  Location: [facility];  Service: Urology;  Laterality: Left;   INSERT / REPLACE / REMOVE PACEMAKER  02/16/2017   LAPAROSCOPY N/A 06/04/2020   Procedure: POSSIBLE LAPAROTOMY POSSIBLE BOWEL RESSECTION;  Surgeon: Clovis Riley, MD;  Location: [facility];  Service: General;  Laterality: N/A;   PACEMAKER IMPLANT N/A 02/16/2017   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 by Dr Rayann Heman   PROSTATE BIOPSY  11/23/2019   WRIST FRACTURE SURGERY Left     with pins    Social History: Social History   Tobacco Use   Smoking status: Former    Packs/day: 0.12    Years: 8.00    Pack years: 0.96    Types: Cigarettes    Quit date: 11/24/1965    Years since quitting: 55.2   Smokeless tobacco: Never  Vaping Use   Vaping Use: Never used  Substance Use Topics   Alcohol use: Yes    Comment: 02/16/2017 "might have 1 drink/month"   Drug use: No    Family History: Family History  Problem Relation Age of Onset   CVA Mother    Coronary artery disease Mother    Stroke Mother    Coronary artery disease Father    Heart attack Father    Stomach cancer Maternal Grandmother    Breast cancer Neg Hx    Prostate cancer Neg Hx    Colon cancer Neg Hx    Pancreatic cancer Neg Hx    Allergies and Medications: Allergies  Allergen Reactions   Atorvastatin Palpitations   Ezetimibe Palpitations   No current facility-administered medications on file prior to encounter.   Current Outpatient Medications  on File Prior to Encounter  Medication Sig Dispense Refill   acetaminophen (TYLENOL) 500 MG tablet Take 500 mg by mouth daily as needed for mild pain.     calcium carbonate (TUMS - DOSED IN MG ELEMENTAL CALCIUM) 500 MG chewable tablet Chew 2 tablets by mouth daily as needed for indigestion or heartburn.     ELIQUIS 5 MG TABS tablet TAKE 1 TABLET(5 MG) BY MOUTH TWICE DAILY 180 tablet 1   furosemide (LASIX) 20 MG tablet Take 20 mg by mouth daily.     lidocaine (LIDODERM) 5 % 1 patch daily as needed (pain).     Multiple Vitamin (MULTIVITAMIN WITH MINERALS) TABS tablet Take 1 tablet by mouth daily.     Multiple Vitamins-Minerals (EYE VITAMINS) CAPS Take 1 capsule by mouth daily.      oxyCODONE-acetaminophen (PERCOCET/ROXICET) 5-325 MG tablet Take 1 tablet by mouth every 6 (six) hours as needed for severe pain.     pantoprazole (PROTONIX) 40 MG tablet Take 40 mg daily by mouth.  2   Propylene Glycol (SYSTANE BALANCE OP) Place 1 drop into both eyes daily.      rosuvastatin (CRESTOR) 10 MG tablet Take 10 mg by mouth every morning.     sodium chloride (OCEAN) 0.65 % SOLN nasal spray Place 1 spray into both nostrils daily as needed for congestion.     tamsulosin (FLOMAX) 0.4 MG CAPS capsule TAKE 1 CAPSULE(0.4 MG) BY MOUTH DAILY AFTER AND SUPPER 30 capsule 5   trolamine salicylate (ASPERCREME) 10 % cream Apply 1 application topically daily.      Objective: BP (!) 149/112   Pulse (!) 162   Temp 97.7 F (36.5 C) (Oral)   Resp (!) 29   Ht 6\' 2"  (1.88 m)   Wt 92.1 kg   SpO2 100%   BMI 26.06 kg/m  Exam: General: WDWN, NAD Neck: normal ROM Cardiovascular: normal rate, abnormal rhythm, no murmurs auscultated, 2+ tibial pulses Respiratory: CTAB, no murmurs auscultated Gastrointestinal: soft, nontender, bowel sounds throughout MSK: ROM wnl  Derm: no rashes or lesions appreciated Back: back not tender to palpation, b/l pain of sacral tuberosity R>L Neuro: strength 5/5 BUE/BLE, sensation intact throughout except with minimal numbness of medial portion of b/l calves, rectal tone wnl Psych: normal mood and behavior  Labs and Imaging: CBC BMET  Recent Labs  Lab 02/06/21 1228  WBC 15.0*  HGB 12.1*  HCT 36.5*  PLT 231   Recent Labs  Lab 02/06/21 1228  NA 137  K 3.6  CL 103  CO2 25  BUN 15  CREATININE 0.77  GLUCOSE 108*  CALCIUM 10.1     CT ABDOMEN PELVIS W CONTRAST  Result Date: 02/06/2021 CLINICAL DATA:  Abdominal pain EXAM: CT ABDOMEN AND PELVIS WITH CONTRAST TECHNIQUE: Multidetector CT imaging of the abdomen and pelvis was performed using the standard protocol following bolus administration of intravenous contrast. CONTRAST:  144mL OMNIPAQUE IOHEXOL 300 MG/ML  SOLN COMPARISON:  06/01/2020 FINDINGS: Lower chest: Patchy areas of ground-glass and airspace attenuation identified within the right middle lobe and right lower which appears similar to 12/16/2020. Small scattered lung nodules are again noted imaged portions of the right  lung. Hepatobiliary: No focal liver abnormality. Moderate gallbladder distension. No gallstones, gallbladder wall thickening or pericholecystic inflammation. No significant bile duct dilatation. Pancreas: Unremarkable. No pancreatic ductal dilatation or surrounding inflammatory changes. Spleen: Normal in size without focal abnormality. Adrenals/Urinary Tract: Normal adrenal glands. The kidneys are unremarkable. No mass, nephrolithiasis or hydronephrosis. Urinary bladder is  unremarkable. Stomach/Bowel: Stomach appears normal. Status post appendectomy. No pathologic dilatation of the large or small bowel loops to suggest recurrent bowel obstruction. Distal colonic diverticula noted without signs of acute diverticulitis. Vascular/Lymphatic: Aortic atherosclerosis without aneurysm. No signs of abdominopelvic adenopathy. Reproductive: Fiducial markers identified within the prostate gland. Other: No free fluid or fluid collections. Musculoskeletal: The bones appear osteopenic. Degenerative disc disease is identified at L4-5 and L5-S1. New inferior endplate compression deformity is noted involving the L5 vertebral body, image 119/6. IMPRESSION: 1. No acute findings identified within the abdomen or pelvis. No signs of recurrent bowel obstruction. 2. Ground-glass and airspace densities are noted within the right middle lobe and right lower lobe with scattered small lung nodules. Findings are similar to 12/16/2020 and are favored to represent sequelae of atypical infectious process. 3. New inferior endplate compression deformity involving the L5 vertebral body. 4. Aortic Atherosclerosis (ICD10-I70.0). Electronically Signed   By: Kerby Moors M.D.   On: 02/06/2021 16:30   CT L-SPINE NO CHARGE  Result Date: 02/06/2021 CLINICAL DATA:  Low back pain EXAM: CT LUMBAR SPINE WITHOUT CONTRAST TECHNIQUE: Multidetector CT imaging of the lumbar spine was performed without intravenous contrast administration. Multiplanar CT image  reconstructions were also generated. COMPARISON:  Correlation made with CT abdomen March 2022 FINDINGS: Segmentation: 5 lumbar type vertebrae. Alignment: Similar grade 1 anterolisthesis at L4-L5. Vertebrae: There is new loss of height at the inferior endplate of L5. Height loss is less than 50%. Chronic large Schmorl's node of the L1 superior endplate. Paraspinal and other soft tissues: Extra-spinal findings are better evaluated on concurrent dedicated imaging. Disc levels: Multilevel disc space narrowing. Vacuum disc phenomena at L4-L5 and L5-S1. Facet hypertrophy is greatest at L4-L5 and L5-S1. There is no high-grade canal narrowing identified. Mild foraminal narrowing at lower lumbar levels. IMPRESSION: Age-indeterminate compression fracture at the inferior endplate of L5 with less than 50% loss height (new since March 2022). Degenerative changes without high-grade stenosis. Lower lumbar facet degeneration. Electronically Signed   By: Macy Mis M.D.   On: 02/06/2021 16:26   DG Chest Port 1 View  Result Date: 02/06/2021 CLINICAL DATA:  Fever. EXAM: PORTABLE CHEST 1 VIEW COMPARISON:  August 13, 2020. FINDINGS: Stable cardiomediastinal silhouette. 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 Decreased right basilar opacity is noted suggesting improving atelectasis or infiltrate left lung is clear. Bony thorax is unremarkable. IMPRESSION: Improved right basilar opacity as described above. Electronically Signed   By: Marijo Conception M.D.   On: 02/06/2021 14:32    Stevon Gough Guiles, DO 02/06/2021, 7:47 PM PGY-1, [place] Intern pager: [phone], text pages welcome  FPTS Upper-Level Resident Addendum   I have independently interviewed and examined the patient. I have discussed the above with Dr. Madison Hickman and agree with the documented plan. My edits for correction/addition/clarification are included above. Please see any attending notes.   Alcus Dad, MD PGY-2, [place]  [facility] 02/07/2021 2:18 AM  FPTS Service pager: [phone] (text pages welcome through [facility])

## 2021-02-06 NOTE — ED Notes (Signed)
Patient transported to CT 

## 2021-02-06 NOTE — ED Provider Notes (Signed)
[facility] Provider Note   CSN: [id number] Arrival date & time: 02/06/21  1051     History Chief Complaint  Patient presents with   Back Pain    Todd Chapman is a 75 y.o. male.  75 yo M with a chief complaints of low back pain.  This is been going on for about 3 months now.  Seems to get worse with prolonged sitting.  The patient went to [name] and had to conduct work in a seated position for a prolonged period of time.  He had worsening of this pain and had discussed it with his family physician.  Plans to have him see neurosurgery in tomorrow but with worsening pain was eventually decided to come here and likely obtain an MRI.  Patient has not noted any fevers or chills.  He denies any loss of bowel or bladder denies loss of peritoneal sensation denies numbness or weakness to his legs.  Has severe pain especially upon standing and trying to ambulate.  Denies trauma to the area.  He had some nausea and vomiting.  Had 2 events which seems to have resolved.  He does have a history of prostate cancer and is status post radiation and per his urologist seems to have no residual issue.  The PCP did fax his most recent discussion with the wife who seemed very concerned about severe worsening pain last night.  It was documented that he had discussed this with Dr. Arnoldo Morale about having the MRI performed likely at [facility] due to a pacemaker.  PCP felt like the pain was bilateral radiation to the thighs though the patient describes the pain is more to the SI joints bilaterally without radiation.  The history is provided by the patient and medical records.  Back Pain Location:  Lumbar spine Quality:  Aching Pain severity:  Severe Onset quality:  Gradual Duration:  3 months Timing:  Intermittent Progression:  Worsening Chronicity:  New Relieved by:  Nothing Worsened by:  Sitting, touching, twisting and standing Ineffective treatments:  None  tried Associated symptoms: no abdominal pain, no chest pain, no fever and no headaches       Past Medical History:  Diagnosis Date   Arthritis    "thumbs, back" (02/16/2017)   Bradycardia 02/2017   Dyspnea    with exertion   Flu 2 weeks ago   GERD (gastroesophageal reflux disease)    Hepatitis A as child   HLD (hyperlipidemia)    Persistent atrial fibrillation ([place]) 12/18/2016   Pneumonia 1999; 2018   Presence of permanent cardiac pacemaker    st jude   Prostate cancer [facility])     Patient Active Problem List   Diagnosis Date Noted   Back pain 02/06/2021   SBO (small bowel obstruction) ([place]) 06/01/2020   Pacemaker 05/13/2020   Malignant neoplasm of prostate ([place]) 01/09/2020   Annual physical exam 01/08/2020   Acute upper respiratory infection 01/08/2020   Asymptomatic varicose veins of lower extremity 01/08/2020   Bronchitis 01/08/2020   Cataract 01/08/2020   Constipation 01/08/2020   Diverticulosis of large intestine without perforation or abscess without bleeding 01/08/2020   Screening for gout 01/08/2020   False positive serological test for syphilis 01/08/2020   Gastro-esophageal reflux disease without esophagitis 01/08/2020   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   History of colonic polyps 01/08/2020   Impacted cerumen 01/08/2020   Insomnia 01/08/2020   Osteoarthritis 01/08/2020   Polypharmacy 01/08/2020   Pure hypercholesterolemia 01/08/2020   Rosacea  01/08/2020   Combined forms of age-related cataract of both eyes 07/13/2019   Elevated blood pressure reading 11/08/2018   Sick sinus syndrome ([place]) 02/16/2017   Persistent atrial fibrillation ([place]) 12/18/2016   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   Nonexudative age-related macular degeneration, bilateral, 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   Dermatochalasis of both upper eyelids 07/25/2014   Nuclear sclerosis of both eyes 07/25/2014   Bradycardia 05/29/2014   Premature atrial contractions 05/29/2014   Premature  junctional contractions ([place]) 05/29/2014   Hyperlipidemia 05/29/2014   Encounter for general adult medical examination with abnormal findings 04/24/2014   Vitamin D deficiency 04/24/2014    Past Surgical History:  Procedure Laterality Date   APPENDECTOMY     age 63   CARDIOVERSION  02/16/2017   CARDIOVERSION N/A 02/16/2017   Procedure: CARDIOVERSION;  Surgeon: Thompson Grayer, MD;  Location: [facility];  Service: Cardiovascular;  Laterality: N/A;   COLONOSCOPY WITH PROPOFOL N/A 12/13/2012   Procedure: COLONOSCOPY WITH PROPOFOL;  Surgeon: Garlan Fair, MD;  Location: [facility];  Service: Endoscopy;  Laterality: N/A;   FRACTURE SURGERY     HYDROCELE EXCISION Left 05/16/2018   Procedure: HYDROCELECTOMY ADULT;  Surgeon: [name], MD;  [facility];  Service: Urology;  Laterality: Left;   INSERT / REPLACE / REMOVE PACEMAKER  02/16/2017   LAPAROSCOPY N/A 06/04/2020   Procedure: POSSIBLE LAPAROTOMY POSSIBLE BOWEL RESSECTION;  Surgeon: Clovis Riley, MD;  Location: [facility];  Service: General;  Laterality: N/A;   PACEMAKER IMPLANT N/A 02/16/2017   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 by Dr Rayann Heman   PROSTATE BIOPSY  11/23/2019   WRIST FRACTURE SURGERY Left    with pins       Family History  Problem Relation Age of Onset   CVA Mother    Coronary artery disease Mother    Stroke Mother    Coronary artery disease Father    Heart attack Father    Stomach cancer Maternal Grandmother    Breast cancer Neg Hx    Prostate cancer Neg Hx    Colon cancer Neg Hx    Pancreatic cancer Neg Hx     Social History   Tobacco Use   Smoking status: Former    Packs/day: 0.12    Years: 8.00    Pack years: 0.96    Types: Cigarettes    Quit date: 11/24/1965    Years since quitting: 55.2   Smokeless tobacco: Never  Vaping Use   Vaping Use: Never used  Substance Use Topics   Alcohol use: Yes     Comment: 02/16/2017 "might have 1 drink/month"   Drug use: No    Home Medications Prior to Admission medications   Medication Sig Start Date End Date Taking? Authorizing Provider  acetaminophen (TYLENOL) 500 MG tablet Take 500 mg by mouth daily as needed for mild pain.   Yes [provider]  calcium carbonate (TUMS - DOSED IN MG ELEMENTAL CALCIUM) 500 MG chewable tablet Chew 2 tablets by mouth daily as needed for indigestion or heartburn.   Yes [provider]  ELIQUIS 5 MG TABS tablet TAKE 1 TABLET(5 MG) BY MOUTH TWICE DAILY 09/16/20  Yes Allred, Jeneen Rinks, MD  furosemide (LASIX) 20 MG tablet Take 20 mg by mouth daily. 12/19/20  Yes [provider]  lidocaine (LIDODERM) 5 % 1 patch daily as needed (pain). 12/13/20  Yes [provider]  Multiple Vitamin (MULTIVITAMIN  WITH MINERALS) TABS tablet Take 1 tablet by mouth daily.   Yes [provider]  Multiple Vitamins-Minerals (EYE VITAMINS) CAPS Take 1 capsule by mouth daily.    Yes [provider]  oxyCODONE-acetaminophen (PERCOCET/ROXICET) 5-325 MG tablet Take 1 tablet by mouth every 6 (six) hours as needed for severe pain. 01/23/21  Yes [provider]  pantoprazole (PROTONIX) 40 MG tablet Take 40 mg daily by mouth. 10/16/16  Yes [provider]  Propylene Glycol (SYSTANE BALANCE OP) Place 1 drop into both eyes daily.   Yes [provider]  rosuvastatin (CRESTOR) 10 MG tablet Take 10 mg by mouth every morning.   Yes [provider]  sodium chloride (OCEAN) 0.65 % SOLN nasal spray Place 1 spray into both nostrils daily as needed for congestion.   Yes [provider]  tamsulosin (FLOMAX) 0.4 MG CAPS capsule TAKE 1 CAPSULE(0.4 MG) BY MOUTH DAILY AFTER AND SUPPER 11/12/20  Yes Tyler Pita, MD  trolamine salicylate (ASPERCREME) 10 % cream Apply 1 application topically daily.   Yes [provider]    Allergies    Atorvastatin and  Ezetimibe  Review of Systems   Review of Systems  Constitutional:  Negative for chills and fever.  HENT:  Negative for congestion and facial swelling.   Eyes:  Negative for discharge and visual disturbance.  Respiratory:  Negative for shortness of breath.   Cardiovascular:  Negative for chest pain and palpitations.  Gastrointestinal:  Negative for abdominal pain, diarrhea and vomiting.  Musculoskeletal:  Positive for back pain. Negative for arthralgias and myalgias.  Skin:  Negative for color change and rash.  Neurological:  Negative for tremors, syncope and headaches.  Psychiatric/Behavioral:  Negative for confusion and dysphoric mood.    Physical Exam Updated Vital Signs BP 126/90 (BP Location: Right Arm)   Pulse 93   Temp 98.6 F (37 C)   Resp (!) 23   Ht 6\' 2"  (1.88 m)   Wt 92.1 kg   SpO2 99%   BMI 26.06 kg/m   Physical Exam Vitals and nursing note reviewed.  Constitutional:      Appearance: He is well-developed.  HENT:     Head: Normocephalic and atraumatic.  Eyes:     Pupils: Pupils are equal, round, and reactive to light.  Neck:     Vascular: No JVD.  Cardiovascular:     Rate and Rhythm: Normal rate and regular rhythm.     Heart sounds: No murmur heard.   No friction rub. No gallop.  Pulmonary:     Effort: No respiratory distress.     Breath sounds: No wheezing.  Abdominal:     General: There is no distension.     Tenderness: There is no abdominal tenderness. There is no guarding or rebound.  Musculoskeletal:        General: Normal range of motion.     Cervical back: Normal range of motion and neck supple.     Comments: Pulse motor and sensation intact to bilateral lower extremities.  No midline spinal tenderness step-offs or deformities.  Skin:    Coloration: Skin is not pale.     Findings: No rash.  Neurological:     Mental Status: He is alert and oriented to person, place, and time.  Psychiatric:        Behavior: Behavior normal.    ED Results /  Procedures / Treatments   Labs (all labs ordered are listed, but only abnormal results are displayed) Labs Reviewed  CBC WITH DIFFERENTIAL/PLATELET - Abnormal; Notable for the following components:      Result Value   WBC 15.0 (*)    RBC 3.90 (*)    Hemoglobin 12.1 (*)    HCT 36.5 (*)    Neutro Abs 13.7 (*)    Lymphs Abs 0.4 (*)    Abs Immature Granulocytes 0.14 (*)    All other components within normal limits  COMPREHENSIVE METABOLIC PANEL - Abnormal; Notable for the following components:   Glucose, Bld 108 (*)    Total Bilirubin 1.5 (*)    All other components within normal limits  CBC - Abnormal; Notable for the following components:   RBC 3.36 (*)    Hemoglobin 10.4 (*)    HCT 31.9 (*)    All other components within normal limits  RESP PANEL BY RT-PCR (FLU A&B, COVID) ARPGX2  LIPASE, BLOOD  URINALYSIS, ROUTINE W REFLEX MICROSCOPIC  PROTIME-INR  TYPE AND SCREEN  ABO/RH    EKG None  Radiology CT ABDOMEN PELVIS W CONTRAST  Result Date: 02/06/2021 CLINICAL DATA:  Abdominal pain EXAM: CT ABDOMEN AND PELVIS WITH CONTRAST TECHNIQUE: Multidetector CT imaging of the abdomen and pelvis was performed using the standard protocol following bolus administration of intravenous contrast. CONTRAST:  155mL OMNIPAQUE IOHEXOL 300 MG/ML  SOLN COMPARISON:  06/01/2020 FINDINGS: Lower chest: Patchy areas of ground-glass and airspace attenuation identified within the right middle lobe and right lower which appears similar to 12/16/2020. Small scattered lung nodules are again noted imaged portions of the right lung. Hepatobiliary: No focal liver abnormality. Moderate gallbladder distension. No gallstones, gallbladder wall thickening or pericholecystic inflammation. No significant bile duct dilatation. Pancreas: Unremarkable. No pancreatic ductal dilatation or surrounding inflammatory changes. Spleen: Normal in size without focal abnormality. Adrenals/Urinary Tract: Normal adrenal glands. The kidneys  are unremarkable. No mass, nephrolithiasis or hydronephrosis. Urinary bladder is unremarkable. Stomach/Bowel: Stomach appears normal. Status post appendectomy. No pathologic dilatation of the large or small bowel loops to suggest recurrent bowel obstruction. Distal colonic diverticula noted without signs of acute diverticulitis. Vascular/Lymphatic: Aortic atherosclerosis without aneurysm. No signs of abdominopelvic adenopathy. Reproductive: Fiducial markers identified within the prostate gland. Other: No free fluid or fluid collections. Musculoskeletal: The bones appear osteopenic. Degenerative disc disease is identified at L4-5 and L5-S1. New inferior endplate compression deformity is noted involving the L5 vertebral body, image 119/6. IMPRESSION: 1. No acute findings identified within the abdomen or pelvis. No signs of recurrent bowel obstruction. 2. Ground-glass and airspace densities are noted within the right middle lobe and right lower lobe with scattered small lung nodules. Findings are similar to 12/16/2020 and are favored to represent sequelae of atypical infectious process. 3. New inferior endplate compression deformity involving the L5 vertebral body. 4. Aortic Atherosclerosis (ICD10-I70.0). Electronically Signed   By: Kerby Moors M.D.   On: 02/06/2021 16:30   CT L-SPINE NO CHARGE  Result Date: 02/06/2021 CLINICAL DATA:  Low back pain EXAM: CT LUMBAR SPINE WITHOUT CONTRAST TECHNIQUE: Multidetector CT imaging of the lumbar spine was performed without intravenous contrast administration. Multiplanar CT image reconstructions were also generated. COMPARISON:  Correlation made with CT abdomen March 2022 FINDINGS: Segmentation: 5 lumbar type vertebrae. Alignment: Similar grade 1 anterolisthesis at L4-L5. Vertebrae: There is new loss of height at the inferior endplate of L5. Height loss is less than 50%. Chronic large Schmorl's node of the L1 superior endplate. Paraspinal and other soft tissues:  Extra-spinal findings are better evaluated on concurrent dedicated imaging. Disc levels: Multilevel disc space narrowing.  Vacuum disc phenomena at L4-L5 and L5-S1. Facet hypertrophy is greatest at L4-L5 and L5-S1. There is no high-grade canal narrowing identified. Mild foraminal narrowing at lower lumbar levels. IMPRESSION: Age-indeterminate compression fracture at the inferior endplate of L5 with less than 50% loss height (new since March 2022). Degenerative changes without high-grade stenosis. Lower lumbar facet degeneration. Electronically Signed   By: Macy Mis M.D.   On: 02/06/2021 16:26   DG Chest Port 1 View  Result Date: 02/06/2021 CLINICAL DATA:  Fever. EXAM: PORTABLE CHEST 1 VIEW COMPARISON:  August 13, 2020. FINDINGS: Stable cardiomediastinal silhouette. 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 Decreased right basilar opacity is noted suggesting improving atelectasis or infiltrate left lung is clear. Bony thorax is unremarkable. IMPRESSION: Improved right basilar opacity as described above. Electronically Signed   By: Marijo Conception M.D.   On: 02/06/2021 14:32    Procedures Procedures   Medications Ordered in ED Medications  furosemide (LASIX) tablet 20 mg (has no administration in time range)  rosuvastatin (CRESTOR) tablet 10 mg (has no administration in time range)  calcium carbonate (TUMS - dosed in mg elemental calcium) chewable tablet 400 mg of elemental calcium (has no administration in time range)  pantoprazole (PROTONIX) EC tablet 40 mg (has no administration in time range)  tamsulosin (FLOMAX) capsule 0.4 mg (has no administration in time range)  apixaban (ELIQUIS) tablet 5 mg (has no administration in time range)  sodium chloride (OCEAN) 0.65 % nasal spray 1 spray (has no administration in time range)  multivitamin with minerals tablet 1 tablet (has no administration in time range)  oxyCODONE (Oxy IR/ROXICODONE) immediate release tablet 5 mg (5 mg Oral Given 02/07/21  0457)  senna (SENOKOT) tablet 8.6 mg (has no administration in time range)  polyethylene glycol (MIRALAX / GLYCOLAX) packet 17 g (has no administration in time range)  polyvinyl alcohol (LIQUIFILM TEARS) 1.4 % ophthalmic solution 1 drop (has no administration in time range)  acetaminophen (TYLENOL) tablet 650 mg (650 mg Oral Given 02/06/21 1249)  oxyCODONE (Oxy IR/ROXICODONE) immediate release tablet 5 mg (5 mg Oral Given 02/06/21 1418)  acetaminophen (TYLENOL) tablet 1,000 mg (1,000 mg Oral Given 02/06/21 1418)  sodium chloride 0.9 % bolus 1,000 mL (0 mLs Intravenous Stopped 02/06/21 2226)  iohexol (OMNIPAQUE) 300 MG/ML solution 100 mL (100 mLs Intravenous Contrast Given 02/06/21 1615)  HYDROmorphone (DILAUDID) injection 1 mg (1 mg Intravenous Given 02/06/21 1906)  HYDROmorphone (DILAUDID) injection 0.5 mg (0.5 mg Intravenous Given 02/07/21 5056)    ED Course  I have reviewed the triage vital signs and the nursing notes.  Pertinent labs & imaging results that were available during my care of the patient were reviewed by me and considered in my medical decision making (see chart for details).    MDM Rules/Calculators/A&P                           75 yo M with a chief complaints of bilateral low back pain.  This been going on for about 3 months but has been worsening over the past week and even worse over the past 48 hours.  Patient was found to be febrile in triage with a temp of 102.  He tells me that he does have a mild cough.  He denies any other symptoms consistent with cauda equina syndrome.  We will start with a CT scan to evaluate for intra-abdominal pathology.  If negative would likely obtain an MRI to evaluate  for spinal pathology.    Chest x-ray without obvious pneumonia.  Awaiting UA to screen for urinary tract infection.  COVID test sent.  Blood work otherwise unremarkable no significant LFT elevation.  White blood cell count of 15,000.    Signed out to Dr. Armandina Gemma, please see their  note for further details of care in the ED.  The patients results and plan were reviewed and discussed.   Any x-rays performed were independently reviewed by myself.   Differential diagnosis were considered with the presenting HPI.  Medications  furosemide (LASIX) tablet 20 mg (has no administration in time range)  rosuvastatin (CRESTOR) tablet 10 mg (has no administration in time range)  calcium carbonate (TUMS - dosed in mg elemental calcium) chewable tablet 400 mg of elemental calcium (has no administration in time range)  pantoprazole (PROTONIX) EC tablet 40 mg (has no administration in time range)  tamsulosin (FLOMAX) capsule 0.4 mg (has no administration in time range)  apixaban (ELIQUIS) tablet 5 mg (has no administration in time range)  sodium chloride (OCEAN) 0.65 % nasal spray 1 spray (has no administration in time range)  multivitamin with minerals tablet 1 tablet (has no administration in time range)  oxyCODONE (Oxy IR/ROXICODONE) immediate release tablet 5 mg (5 mg Oral Given 02/07/21 0457)  senna (SENOKOT) tablet 8.6 mg (has no administration in time range)  polyethylene glycol (MIRALAX / GLYCOLAX) packet 17 g (has no administration in time range)  polyvinyl alcohol (LIQUIFILM TEARS) 1.4 % ophthalmic solution 1 drop (has no administration in time range)  acetaminophen (TYLENOL) tablet 650 mg (650 mg Oral Given 02/06/21 1249)  oxyCODONE (Oxy IR/ROXICODONE) immediate release tablet 5 mg (5 mg Oral Given 02/06/21 1418)  acetaminophen (TYLENOL) tablet 1,000 mg (1,000 mg Oral Given 02/06/21 1418)  sodium chloride 0.9 % bolus 1,000 mL (0 mLs Intravenous Stopped 02/06/21 2226)  iohexol (OMNIPAQUE) 300 MG/ML solution 100 mL (100 mLs Intravenous Contrast Given 02/06/21 1615)  HYDROmorphone (DILAUDID) injection 1 mg (1 mg Intravenous Given 02/06/21 1906)  HYDROmorphone (DILAUDID) injection 0.5 mg (0.5 mg Intravenous Given 02/07/21 0651)    Vitals:   02/07/21 0445 02/07/21 0500 02/07/21 0600  02/07/21 0630  BP: 137/85 (!) 113/100 119/71 126/90  Pulse: 67 82 79 93  Resp: 13 19 18  (!) 23  Temp:    98.6 F (37 C)  TempSrc:      SpO2: 99% 95% 97% 99%  Weight:      Height:        Final diagnoses:  Back pain    Admission/ observation were discussed with the admitting physician, patient and/or family and they are comfortable with the plan.    Final Clinical Impression(s) / ED Diagnoses Final diagnoses:  Back pain    Rx / DC Orders ED Discharge Orders     None        Deno Etienne, DO 02/07/21 0703

## 2021-02-06 NOTE — ED Triage Notes (Signed)
Patient BIB [facility] from home for evaluation of lower back pain that started three months ago. Patient alert, oriented, and in no apparent distress at this time.

## 2021-02-06 NOTE — ED Notes (Signed)
Patient resting in stretcher comfortably. Eyes closed, Equal chest rise and fall. Patient alert to verbal stimuli. Call bell in reach, Stretcher in low and locked position. Side rails up x2.   

## 2021-02-06 NOTE — ED Provider Notes (Signed)
Emergency Medicine Provider Triage Evaluation Note  Todd Chapman , a 75 y.o. male  was evaluated in triage.  Pt complains of lower back pain that is been ongoing, describing as a sharp sensation followed by Dr. Arnoldo Morale approximately 3 months ago.  Does report 1 episode of black vomiting this morning.  Reports no abdominal pain at this time however pain along his back has been "debilitating ".  History of prostate cancer.  No problems with urinary retention, bowel incontinence, last bowel movement this morning.  Urinating for [place] in triage.  To be febrile with a temperature of 102.2 in triage, given Tylenol.Of note, pt does have a pacemaker.   Review of Systems  Positive: Back pain, fever, nausea, vomiting Negative: Urinary difficulty, bowel incontinence  Physical Exam  BP 132/80 (BP Location: Left Arm)   Pulse (!) 101   Temp (!) 102.2 F (39 C)   Resp 14   SpO2 96%  Gen:   Awake appears very uncomfortable Resp:  Normal effort  MSK:   Pain with palpation along the midline lumbar spine. Other:    Medical Decision Making  Medically screening exam initiated at 12:27 PM.  Appropriate orders placed.  [name] was informed that the remainder of the evaluation will be completed by another provider, this initial triage assessment does not replace that evaluation, and the importance of remaining in the ED until their evaluation is complete.  Here with back pain, fever, prior hx of CA in September of last year.    Janeece Fitting, PA-C 02/06/21 1230    Merryl Hacker, MD 02/11/21 [phone]

## 2021-02-07 ENCOUNTER — Observation Stay (HOSPITAL_COMMUNITY): Payer: Medicare Other

## 2021-02-07 ENCOUNTER — Ambulatory Visit (INDEPENDENT_AMBULATORY_CARE_PROVIDER_SITE_OTHER): Payer: Medicare Other

## 2021-02-07 DIAGNOSIS — M4316 Spondylolisthesis, lumbar region: Secondary | ICD-10-CM | POA: Diagnosis not present

## 2021-02-07 DIAGNOSIS — S32050A Wedge compression fracture of fifth lumbar vertebra, initial encounter for closed fracture: Secondary | ICD-10-CM

## 2021-02-07 DIAGNOSIS — I495 Sick sinus syndrome: Secondary | ICD-10-CM

## 2021-02-07 DIAGNOSIS — S32401A Unspecified fracture of right acetabulum, initial encounter for closed fracture: Secondary | ICD-10-CM | POA: Diagnosis not present

## 2021-02-07 DIAGNOSIS — R29890 Loss of height: Secondary | ICD-10-CM | POA: Diagnosis not present

## 2021-02-07 DIAGNOSIS — C61 Malignant neoplasm of prostate: Secondary | ICD-10-CM | POA: Diagnosis not present

## 2021-02-07 DIAGNOSIS — S32402A Unspecified fracture of left acetabulum, initial encounter for closed fracture: Secondary | ICD-10-CM | POA: Diagnosis not present

## 2021-02-07 DIAGNOSIS — Z8546 Personal history of malignant neoplasm of prostate: Secondary | ICD-10-CM | POA: Diagnosis not present

## 2021-02-07 DIAGNOSIS — S32512A Fracture of superior rim of left pubis, initial encounter for closed fracture: Secondary | ICD-10-CM | POA: Diagnosis not present

## 2021-02-07 DIAGNOSIS — M545 Low back pain, unspecified: Secondary | ICD-10-CM | POA: Diagnosis not present

## 2021-02-07 LAB — CBC
HCT: 31.9 % — ABNORMAL LOW (ref 39.0–52.0)
Hemoglobin: 10.4 g/dL — ABNORMAL LOW (ref 13.0–17.0)
MCH: 31 pg (ref 26.0–34.0)
MCHC: 32.6 g/dL (ref 30.0–36.0)
MCV: 94.9 fL (ref 80.0–100.0)
Platelets: 187 10*3/uL (ref 150–400)
RBC: 3.36 MIL/uL — ABNORMAL LOW (ref 4.22–5.81)
RDW: 14.4 % (ref 11.5–15.5)
WBC: 10.2 10*3/uL (ref 4.0–10.5)
nRBC: 0 % (ref 0.0–0.2)

## 2021-02-07 IMAGING — MR MR PELVIS WO/W CM
4 of 8 series · 18 of 48 positions shown · non-contrast
Comparison: CT scan [DATE]

CLINICAL DATA: Low back and pelvic pain. History of prostate cancer

EXAM:
MRI PELVIS WITHOUT CONTRAST
TECHNIQUE: Multiplanar multisequence MR imaging of the pelvis was performed. No
intravenous contrast was administered.

[Series 12: T1 · coronal · 5.0mm · 0.86mm/px · 6 of 28 slices shown (1 of 2)]
[im 1/28]
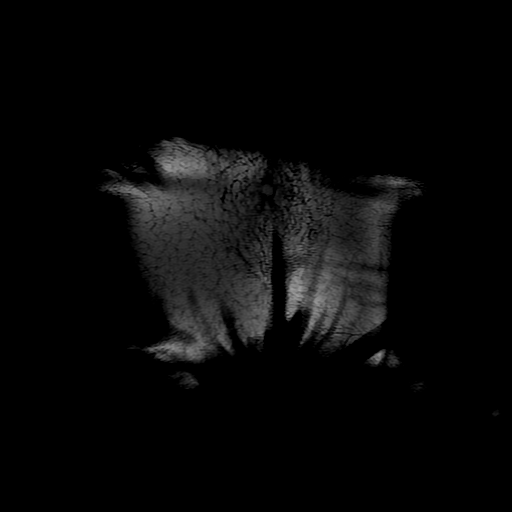
[im 6/28]
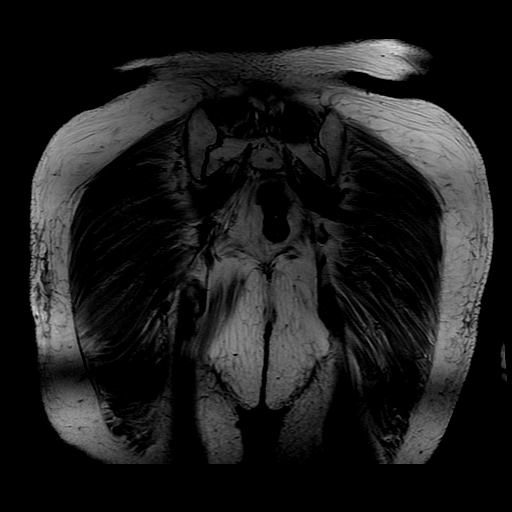
[im 11/28]
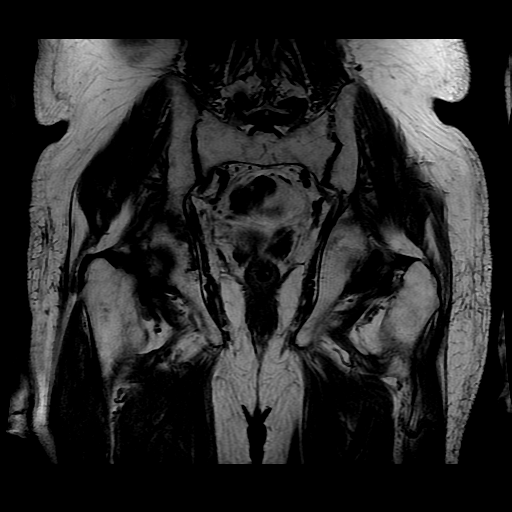
[im 17/28]
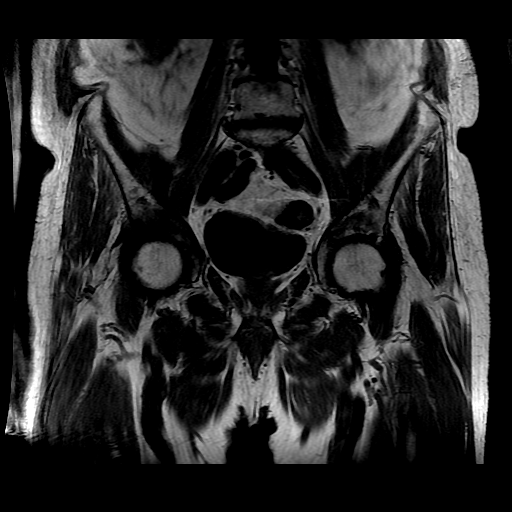
[im 22/28]
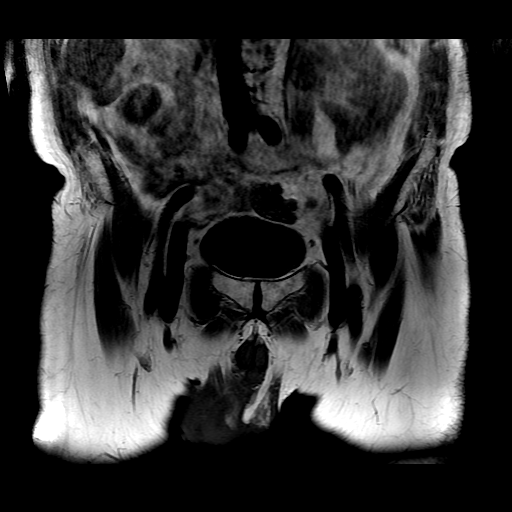
[im 28/28]
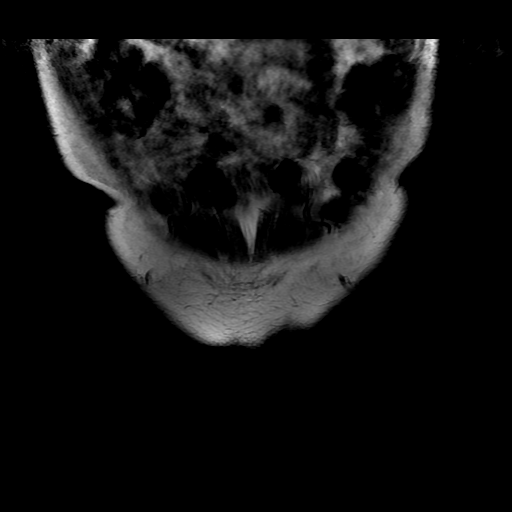

[Series 14: T1 · axial · 8.0mm · 0.78mm/px · z∈[-162,+88]mm · 5 of 26 slices shown (2 of 2)]
[im 1/26]
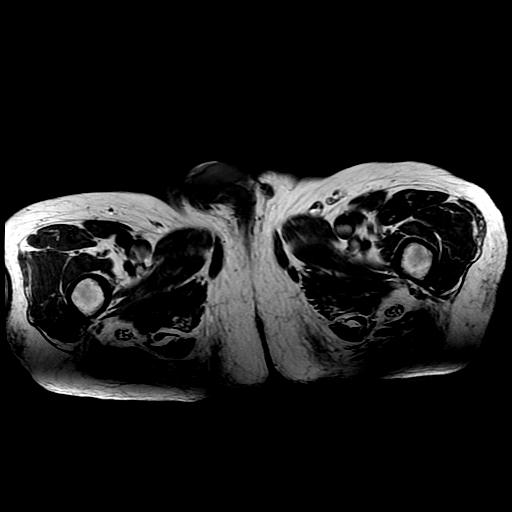
[im 7/26]
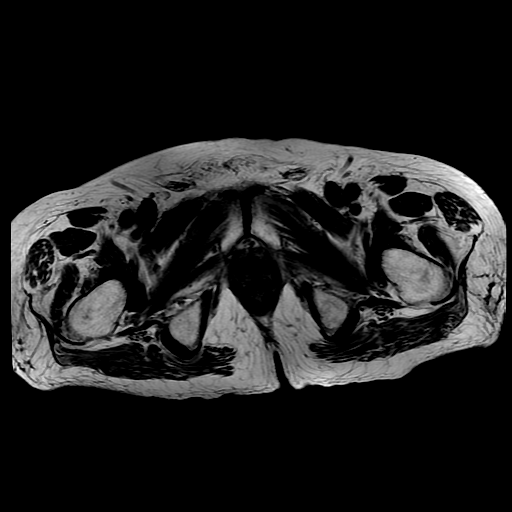
[im 13/26]
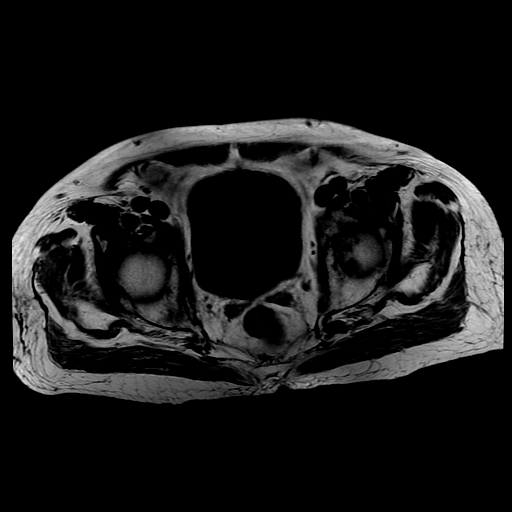
[im 19/26]
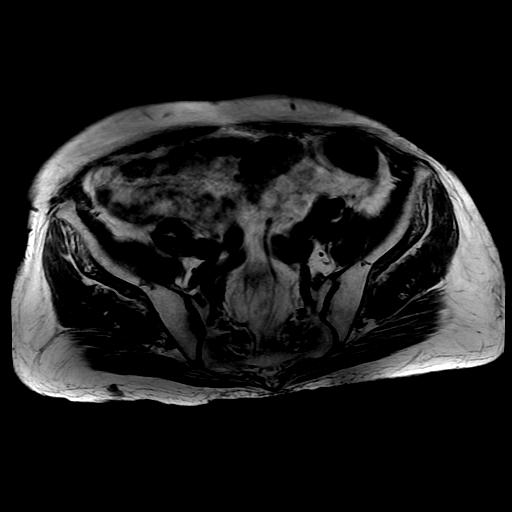
[im 26/26]
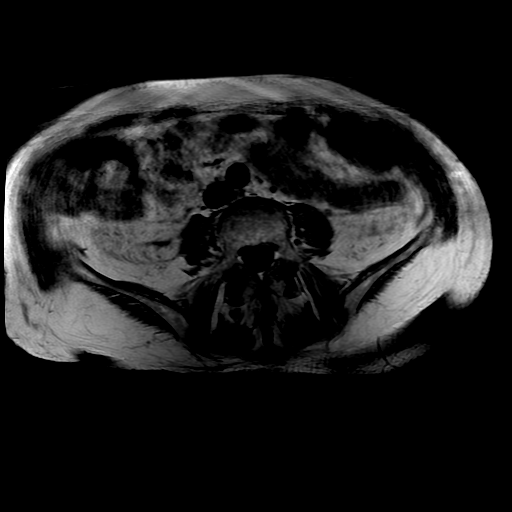

[Series 16: T1 fat-sat · axial · non-contrast · 8.0mm · 0.78mm/px · z∈[-162,+98]mm · 4 of 27 slices shown]
[im 1/27]
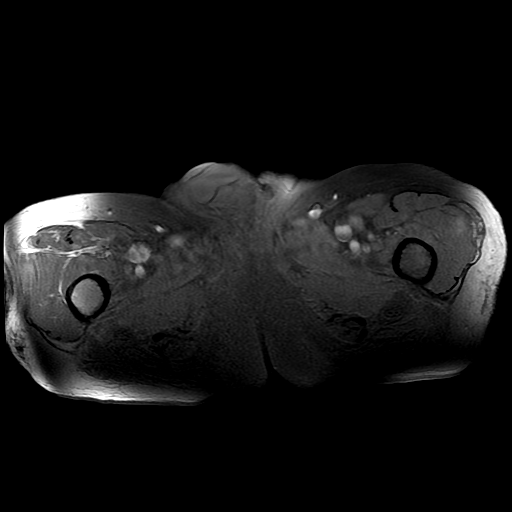
[im 7/27]
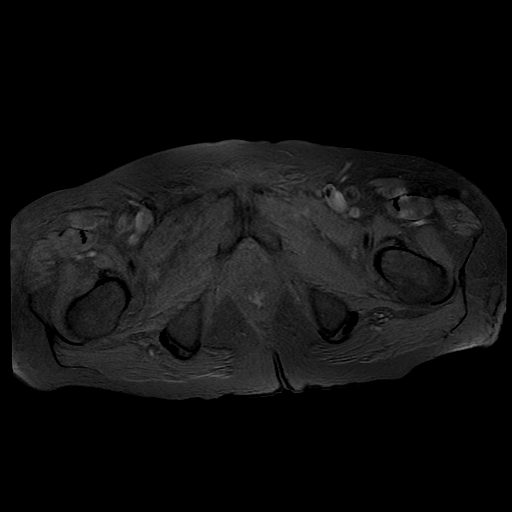
[im 14/27]
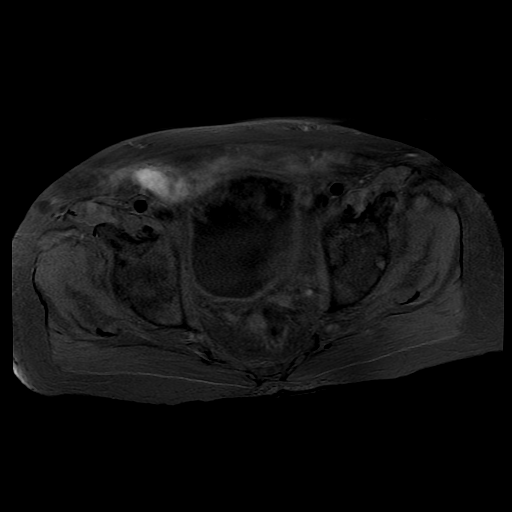
[im 27/27]
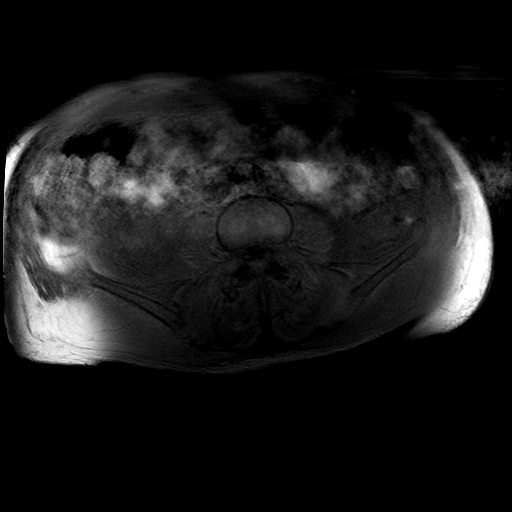

[Series 18: T1 fat-sat post-contrast · axial · 8.0mm · 0.78mm/px · z∈[-162,+108]mm · 3 of 28 slices shown]
[im 1/28]
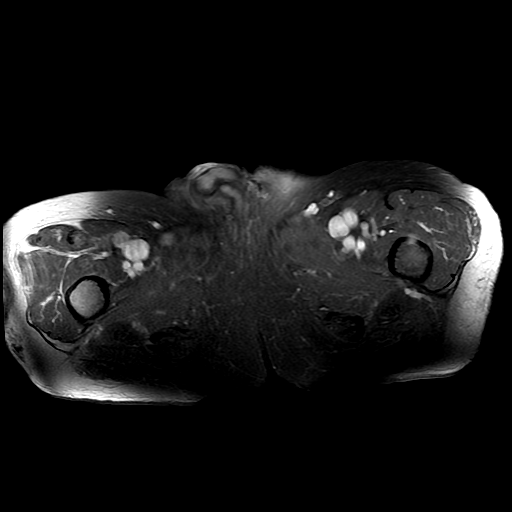
[im 14/28]
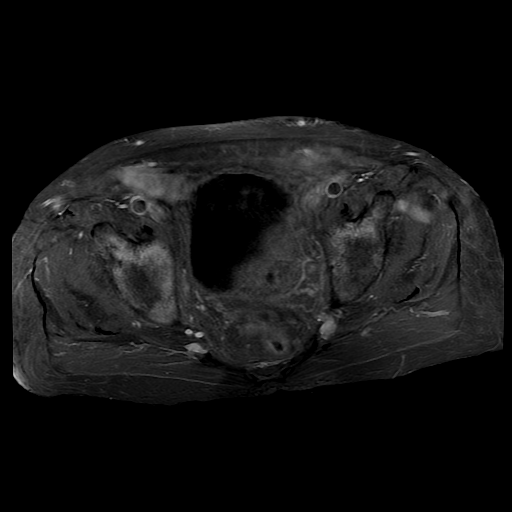
[im 28/28]
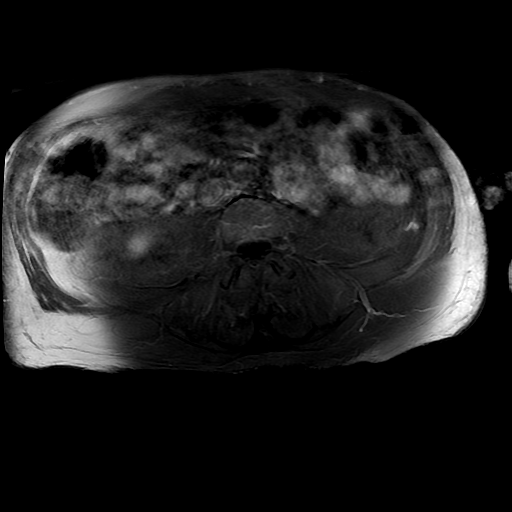

[18 of 48 positions shown; findings below may reference images not displayed]

Multiplanar multisequence MR imaging of the pelvis was performed
both before and after administration of intravenous contrast.
Compatible pacemaker noted, manufacturer guidelines followed for
scanning. Please note that today's exam was performed using routine
MRI musculoskeletal pelvis protocol, and a dedicated prostate MRI
was not performed.
FINDINGS: Bones:Inferior endplate compression fracture at L5 is observed for
example on image 35 series 17, please see report from dedicated
lumbar spine MRI.

Bilateral acetabular stress fractures primarily involving the
acetabular roof but also the acetabular walls as shown on images 10
through 15 of series 15. In addition, there is subtle focal edema in
the left pubic bone and adjacent superior ramus from stress fracture
or nondisplaced fracture. The prior hip MRI from [DATE] there
was a left acetabular stress fracture, currently there is also
symmetric involvement of the right side. Given the morphology and
appearance I am skeptical of underlying osseous metastatic disease.

Articular cartilage and labrum

Articular cartilage:  Degenerative chondral thinning in both hips.

Labrum:  N/A

Joint or bursal effusion

Joint effusion: Absent

Bursae: Fluid tracks along the deep surface of the iliacus muscles
bilaterally, a nonspecific finding which can be associated with
other regional pelvic abnormalities.

Muscles and tendons

Minimal accentuated T2 signal anteriorly in the gluteus minimus and
medius muscles in a symmetric manner, without associated significant
abnormal enhancement, likely incidental. There is low-grade edema in
the hip adductor musculature for example on image 19 series 15 in a
symmetric manner.

Other findings

Minimal presacral edema. No prostatomegaly. Sigmoid colon
diverticulosis.

Despite efforts by the technologist and patient, motion artifact is
present on today's exam and could not be eliminated. This reduces
exam sensitivity and specificity.
IMPRESSION: 1. Bilateral acetabular/supra-acetabular stress fractures in the
pelvis. There is likewise some stress fracture involving the left
medial superior pubic ramus. This overall represents a progression
compared to the previous unilateral left acetabular stress fracture
shown on [DATE].
2. L5 compression fracture as reported on recent MRI.
3. There is some low-grade edema along pelvic musculature, most
notably the hip adductor musculature and along the deep side of the
iliacus muscles.

## 2021-02-07 IMAGING — MR MR LUMBAR SPINE WO/W CM
4 of 7 series · 18 of 48 positions shown · IV contrast (9     GAD)
Comparison: Correlation made with CT [DATE]

CLINICAL DATA: Low back pain, infection suspected; compression
fracture on recent CT, history of prostate cancer

EXAM:
MRI LUMBAR SPINE WITHOUT AND WITH CONTRAST
TECHNIQUE: Multiplanar and multiecho pulse sequences of the lumbar spine were
obtained without and with intravenous contrast.
CONTRAST:  9mL GADAVIST GADOBUTROL 1 MMOL/ML IV SOLN

[Series 5: T2 · sagittal · 4.0mm · 0.55mm/px · 3 of 16 slices shown (1 of 2)]
[im 1/16]
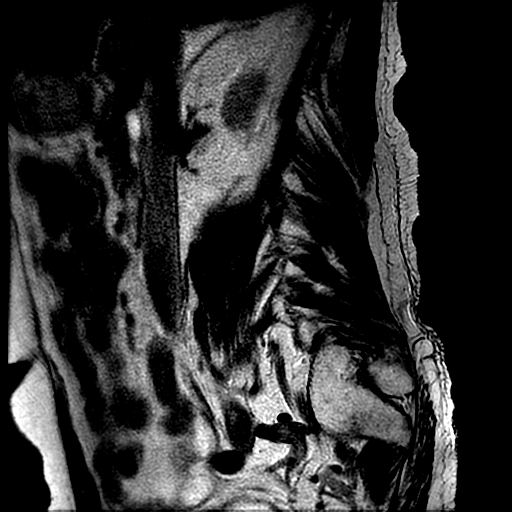
[im 8/16]
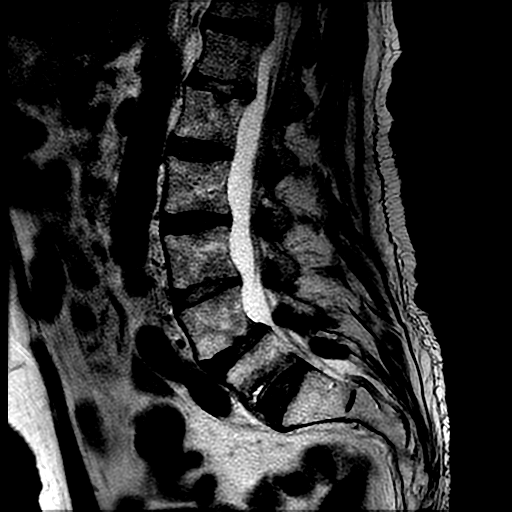
[im 16/16]
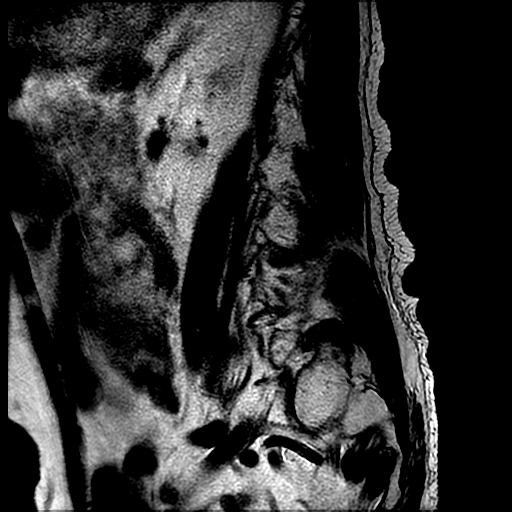

[Series 6: T1 · sagittal · 4.0mm · 0.55mm/px · 3 of 16 slices shown (1 of 2)]
[im 1/16]
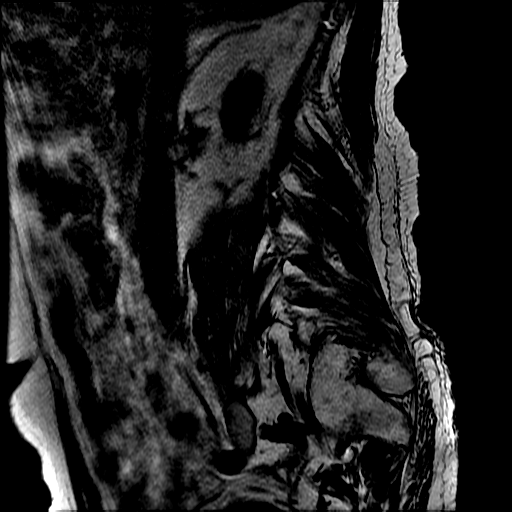
[im 11/16]
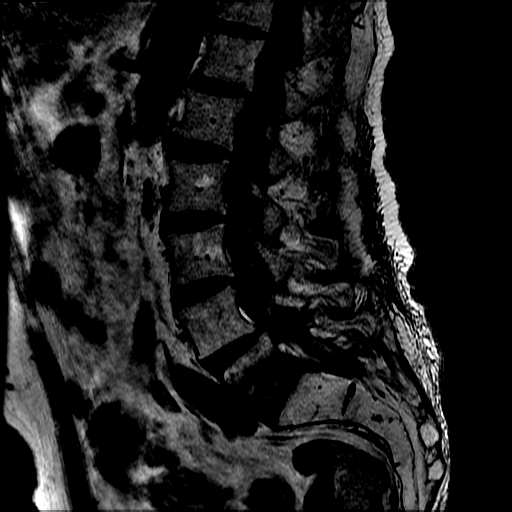
[im 16/16]
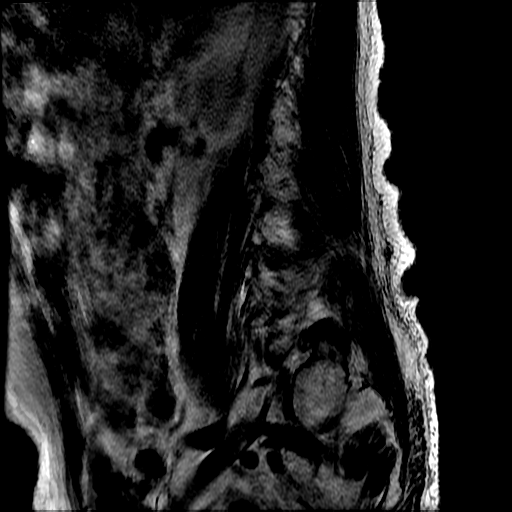

[Series 8: T2 · axial · 4.0mm · 0.39mm/px · z∈[-30,+194]mm · 9 of 43 slices shown (2 of 2)]
[im 1/43]
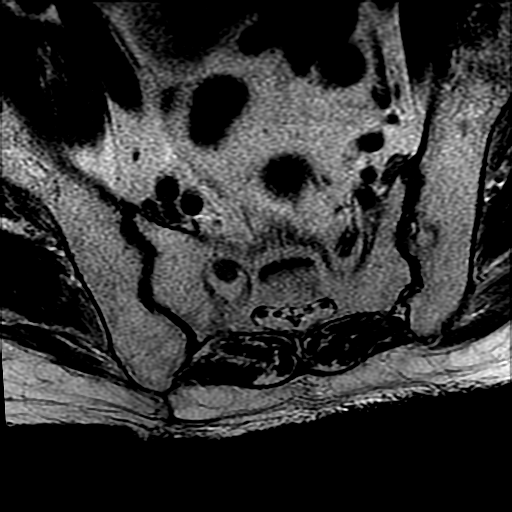
[im 5/43]
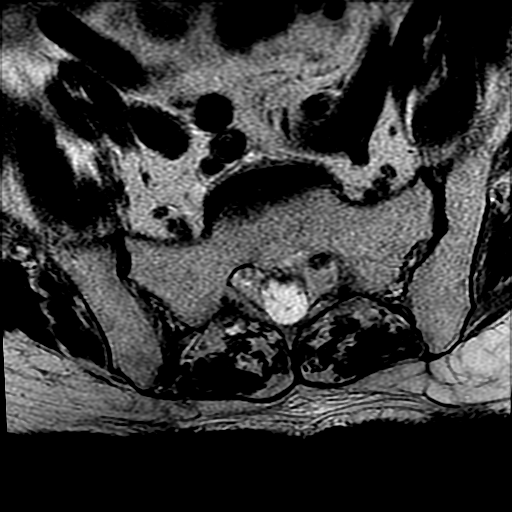
[im 9/43]
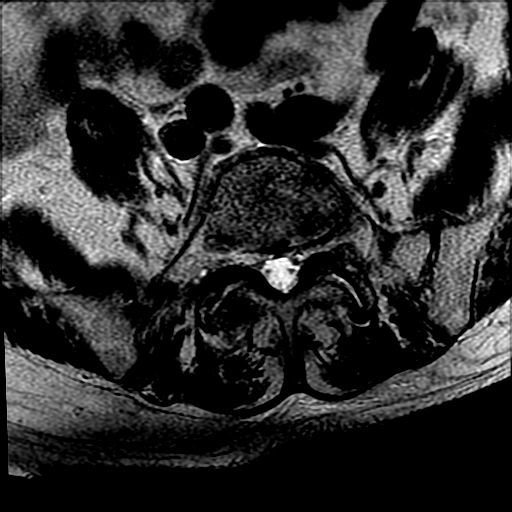
[im 13/43]
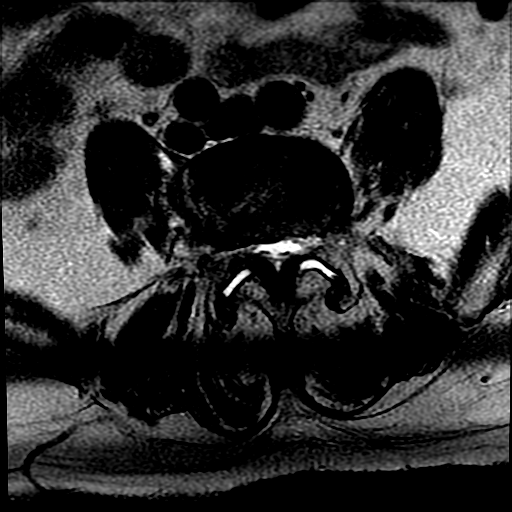
[im 17/43]
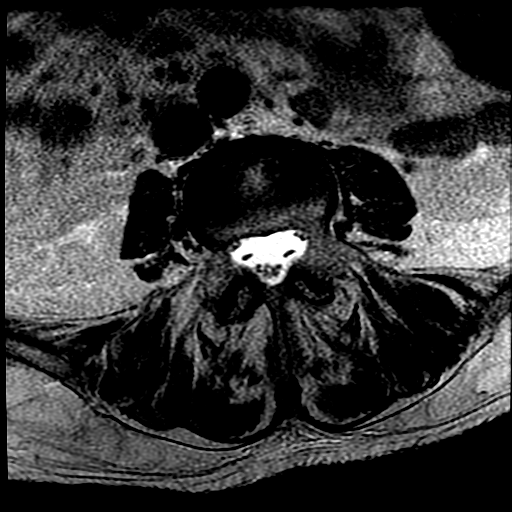
[im 22/43]
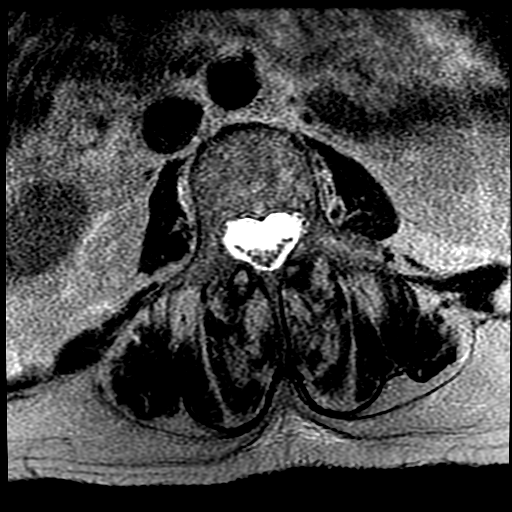
[im 26/43]
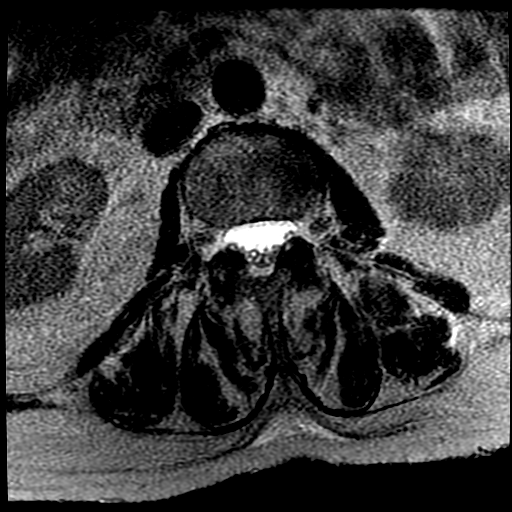
[im 30/43]
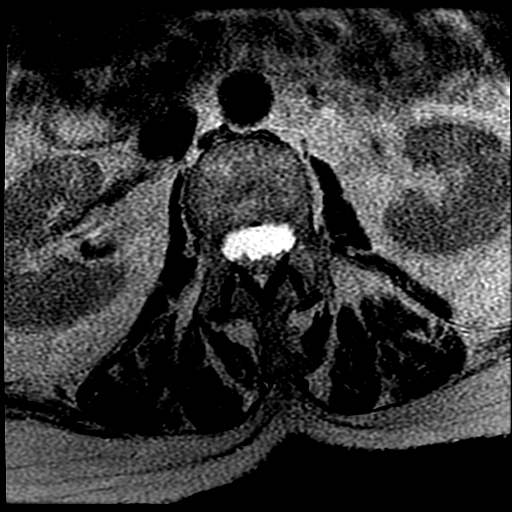
[im 38/43]
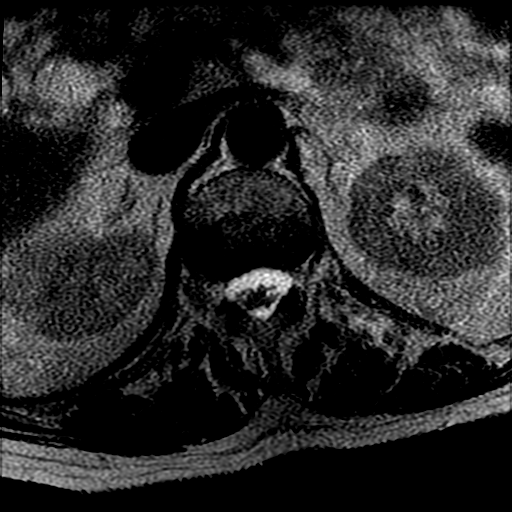

[Series 9: T1 · axial · 4.0mm · 0.39mm/px · z∈[-13,+194]mm · 3 of 43 slices shown (2 of 2)]
[im 5/43]
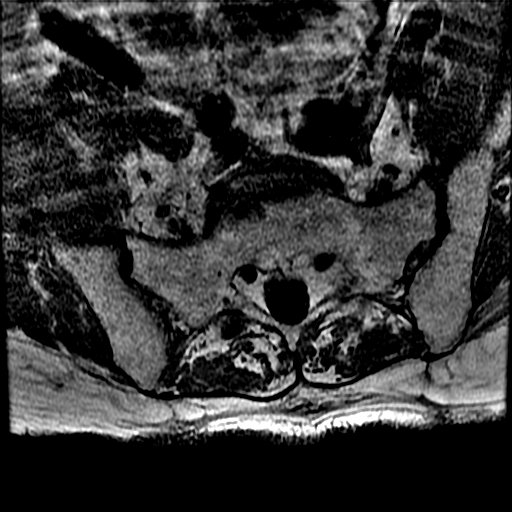
[im 22/43]
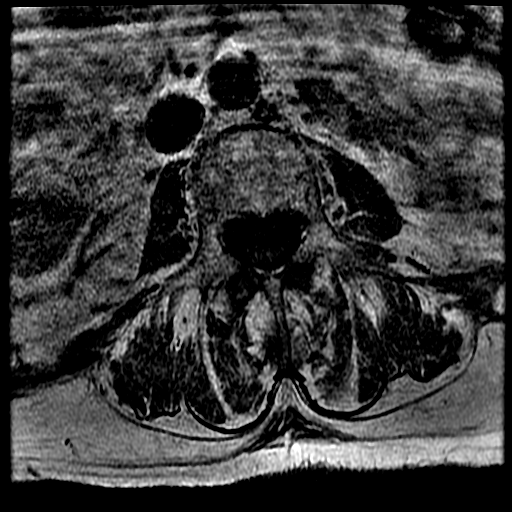
[im 38/43]
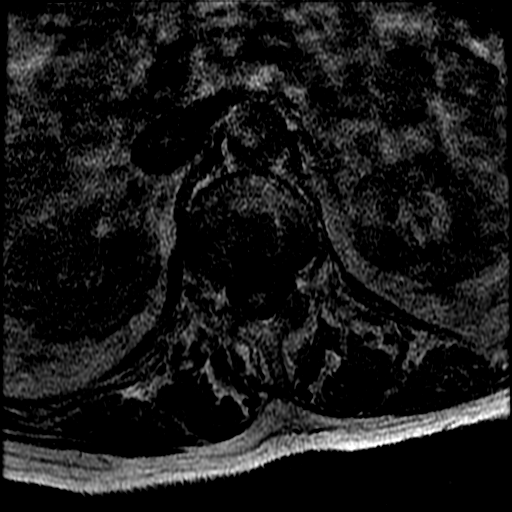

[18 of 48 positions shown; findings below may reference images not displayed]

FINDINGS: Motion artifact is present.

Segmentation: Standard.

Alignment:  Grade 1 anterolisthesis at L4-L5.

Vertebrae: L5 inferior endplate compression fracture with less than
50% loss of height. Marrow edema and enhancement are present.
Minimal adjacent ventral epidural enhancement. There is mild marrow
edema associated with L5-S1 facet arthropathy. Degenerative endplate
irregularity at L4-L5. Schmorl's node at superior L1 endplate with
adjacent STIR hyperintensity and enhancement.

Conus medullaris and cauda equina: Conus extends to the L1 level.
Conus and cauda equina appear normal.

Paraspinal and other soft tissues: Unremarkable. No significant
paraspinal edema or mass.

Disc levels:

L1-L2:  No stenosis.

L2-L3: Minimal disc bulge with endplate osteophytic ridging. Mild
facet arthropathy. No stenosis.

L3-L4: Minimal disc bulge with endplate osteophytic ridging. Mild
facet arthropathy. No stenosis.

L4-L5: Anterolisthesis with uncovering of disc. Marked facet
arthropathy with ligamentum flavum infolding and joint effusions.
Small extra-spinal synovial cyst formation. Mild canal stenosis.
Moderate right and minor left foraminal stenosis.

L5-S1: Moderate facet arthropathy with ligamentum flavum infolding
and joint effusions. Small extra-spinal synovial cyst formation. No
stenosis.
IMPRESSION: Acute to subacute L5 inferior endplate compression fracture with
less than 50% loss of height and no significant retropulsion. No
definite evidence of underlying lesion. Given prostate cancer
history, a follow-up study is recommended.

Abnormal signal and enhancement adjacent to L1 Schmorl's node is
likely degenerative but can also be reevaluated at the time of
follow-up.

Facet predominant degenerative changes as detailed above.

## 2021-02-07 MED ORDER — ACETAMINOPHEN 325 MG PO TABS
650.0000 mg | ORAL_TABLET | Freq: Four times a day (QID) | ORAL | Status: DC
  Administered 2021-02-07 (×2): 650 mg via ORAL
  Filled 2021-02-07 (×2): qty 2

## 2021-02-07 MED ORDER — CALCITONIN (SALMON) 200 UNIT/ACT NA SOLN
1.0000 | Freq: Every day | NASAL | Status: DC
  Administered 2021-02-07 – 2021-02-12 (×5): 1 via NASAL
  Filled 2021-02-07 (×2): qty 3.7

## 2021-02-07 MED ORDER — LORAZEPAM 0.5 MG PO TABS
0.5000 mg | ORAL_TABLET | Freq: Once | ORAL | Status: DC | PRN
  Filled 2021-02-07: qty 1

## 2021-02-07 MED ORDER — HYDROMORPHONE HCL 1 MG/ML IJ SOLN
0.5000 mg | Freq: Once | INTRAMUSCULAR | Status: AC
  Administered 2021-02-07: 0.5 mg via INTRAVENOUS
  Filled 2021-02-07: qty 1

## 2021-02-07 MED ORDER — OXYCODONE HCL 5 MG PO TABS: 10.0000 mg | ORAL_TABLET | ORAL | Status: DC | PRN

## 2021-02-07 MED ORDER — OXYCODONE HCL 5 MG PO TABS
10.0000 mg | ORAL_TABLET | ORAL | Status: DC | PRN
  Administered 2021-02-07 – 2021-02-08 (×6): 10 mg via ORAL
  Filled 2021-02-07 (×6): qty 2

## 2021-02-07 MED ORDER — GADOBUTROL 1 MMOL/ML IV SOLN
9.0000 mL | Freq: Once | INTRAVENOUS | Status: AC | PRN
  Administered 2021-02-07: 9 mL via INTRAVENOUS

## 2021-02-07 MED ORDER — LORAZEPAM 1 MG PO TABS
0.5000 mg | ORAL_TABLET | Freq: Once | ORAL | Status: AC | PRN
  Administered 2021-02-07: 0.5 mg via ORAL

## 2021-02-07 NOTE — ED Notes (Signed)
Patient requesting more pain medication.  States that oxycodone is not working.  Will message admitting MD

## 2021-02-07 NOTE — Evaluation (Signed)
Physical Therapy Evaluation Patient Details Name: Todd Chapman MRN: [id number] DOB: [birth year] Today's Date: 02/07/2021  History of Present Illness  Pt is a 75 year old man admitted on 02/06/21 with worsening back pain. 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 PMH: afib on Eliquis, pacemaker, HLD, SBO, prostate ca s/p radiation.  Clinical Impression  Pt admitted secondary to problem above with deficits below. Very limited secondary to pain and had difficulty performing transfers and gait. Was able to stand with min A +2 and take side steps at EOB with min guard A. Recommending HHPT pending progression. Pt with flight of steps at home and would benefit from hospital bed for lower level. Will continue to follow acutely.        Recommendations for follow up therapy are one component of a multi-disciplinary discharge planning process, led by the attending physician.  Recommendations may be updated based on patient status, additional functional criteria and insurance authorization.  Follow Up Recommendations Home health PT    Assistance Recommended at Discharge Frequent or constant Supervision/Assistance  Functional Status Assessment Patient has had a recent decline in their functional status and demonstrates the ability to make significant improvements in function in a reasonable and predictable amount of time.  Equipment Recommendations  Rolling walker (2 wheels);BSC/3in1;Hospital bed    Recommendations for Other Services       Precautions / Restrictions Precautions Precautions: Fall;Back Precaution Booklet Issued: No Precaution Comments: Verbally reviewed back precautions Restrictions Weight Bearing Restrictions: No      Mobility  Bed Mobility Overal bed mobility: Needs Assistance Bed Mobility: Rolling;Sidelying to Sit;Sit to Sidelying Rolling: Supervision Sidelying to sit: Supervision     Sit to sidelying: Supervision General  bed mobility comments: Supervision for safety. Cues for log roll technique    Transfers Overall transfer level: Needs assistance Equipment used: Rolling walker (2 wheels) Transfers: Sit to/from Stand Sit to Stand: Min assist;+2 physical assistance           General transfer comment: Min A +2 for steadying to stand. Pt requiring 2-3 attempts to stand fully upright secondary to pain. Pt requiring min guard A to perform side steps at EOB. Unable to tolerate further mobility secondary to pain.    Ambulation/Gait                  Stairs            Wheelchair Mobility    Modified Rankin (Stroke Patients Only)       Balance Overall balance assessment: Needs assistance Sitting-balance support: No upper extremity supported;Feet supported Sitting balance-Leahy Scale: Fair     Standing balance support: Bilateral upper extremity supported Standing balance-Leahy Scale: Poor Standing balance comment: Heavy reliance on BUE support                             Pertinent Vitals/Pain Pain Assessment: Faces Faces Pain Scale: Hurts whole lot Pain Location: back Pain Descriptors / Indicators: Grimacing;Guarding Pain Intervention(s): Limited activity within patient's tolerance;Monitored during session;Repositioned    Home Living Family/patient expects to be discharged to:: Private residence Living Arrangements: Spouse/significant other Available Help at Discharge: Family Type of Home: House Home Access: Stairs to enter Entrance Stairs-Rails: None Entrance Stairs-Number of Steps: 2 Alternate Level Stairs-Number of Steps: 14 Home Layout: Multi-level;Able to live on main level with bedroom/bathroom Home Equipment: Kasandra Knudsen - single point;Rollator (4 wheels)      Prior Function  Prior Level of Function : Independent/Modified Independent             Mobility Comments: Has been using cane for ambulation       Hand Dominance        Extremity/Trunk  Assessment   Upper Extremity Assessment Upper Extremity Assessment: Defer to OT evaluation    Lower Extremity Assessment Lower Extremity Assessment: Overall WFL for tasks assessed    Cervical / Trunk Assessment Cervical / Trunk Assessment: Other exceptions Cervical / Trunk Exceptions: L5 compression fx  Communication   Communication: No difficulties  Cognition Arousal/Alertness: Awake/alert Behavior During Therapy: WFL for tasks assessed/performed Overall Cognitive Status: Within Functional Limits for tasks assessed                                          General Comments      Exercises     Assessment/Plan    PT Assessment Patient needs continued PT services  PT Problem List Decreased activity tolerance;Decreased mobility;Decreased balance;Decreased knowledge of use of DME;Decreased knowledge of precautions;Pain       PT Treatment Interventions DME instruction;Gait training;Stair training;Functional mobility training;Therapeutic activities;Therapeutic exercise;Balance training;Patient/family education    PT Goals (Current goals can be found in the Care Plan section)  Acute Rehab PT Goals Patient Stated Goal: to decrease pain and be active again PT Goal Formulation: With patient Time For Goal Achievement: 02/21/21 Potential to Achieve Goals: Good    Frequency Min 3X/week   Barriers to discharge        Co-evaluation PT/OT/SLP Co-Evaluation/Treatment: Yes Reason for Co-Treatment: Complexity of the patient's impairments (multi-system involvement);For patient/therapist safety;To address functional/ADL transfers PT goals addressed during session: Mobility/safety with mobility;Balance;Proper use of DME         AM-PAC PT "6 Clicks" Mobility  Outcome Measure Help needed turning from your back to your side while in a flat bed without using bedrails?: A Little Help needed moving from lying on your back to sitting on the side of a flat bed without  using bedrails?: A Little Help needed moving to and from a bed to a chair (including a wheelchair)?: A Little Help needed standing up from a chair using your arms (e.g., wheelchair or bedside chair)?: A Little Help needed to walk in hospital room?: Total Help needed climbing 3-5 steps with a railing? : Total 6 Click Score: 14    End of Session Equipment Utilized During Treatment: Gait belt Activity Tolerance: Patient limited by pain Patient left: in bed;with call bell/phone within reach (on stretcher in ED) Nurse Communication: Mobility status PT Visit Diagnosis: Pain;Difficulty in walking, not elsewhere classified (R26.2) Pain - part of body:  (back)    Time: 5852-7782 PT Time Calculation (min) (ACUTE ONLY): 31 min   Charges:   PT Evaluation $PT Eval Moderate Complexity: 1 Mod          Lou Miner, DPT  Acute Rehabilitation Services  Pager: [phone] Office: [phone]   Rudean Hitt 02/07/2021, 12:53 PM

## 2021-02-07 NOTE — Progress Notes (Signed)
FMTS Brief Note  Patient admitted overnight. Examined and evaluated fully this AM. Will sign full resident note as able. Planning for MRI w/ and w/o contrast of lumbar spine and pelvis. Will optimize pain regimen, consider calcitonin. Appreciate Neurosurgery consult.  For fever, will send urine culture. Blood cultures if febrile. No clear source on my examination, certainly spine process possible. Discussed with Dr. Thompson Grayer at 805 AM.

## 2021-02-07 NOTE — Progress Notes (Signed)
Subjective: The patient is alert and pleasant.  His wife is at the bedside.  He complains of midline low back pain at the lumbosacral junction.  He does not have any pain in his hips.  Objective: Vital signs in last 24 hours: Temp:  [98.2 F (36.8 C)-98.9 F (37.2 C)] 98.2 F (36.8 C) (12/02 1625) Pulse Rate:  [61-162] 63 (12/02 1625) Resp:  [13-29] 20 (12/02 1625) BP: (100-149)/(63-112) 139/93 (12/02 1625) SpO2:  [90 %-100 %] 100 % (12/02 1625) Weight:  [92.1 kg] 92.1 kg (12/01 1909) Estimated body mass index is 26.06 kg/m as calculated from the following:   Height as of this encounter: 6\' 2"  (1.88 m).   Weight as of this encounter: 92.1 kg.   Intake/Output from previous day: No intake/output data recorded. Intake/Output this shift: No intake/output data recorded.  Physical exam patient is alert and pleasant.  He is moving his lower extremities well.  Lab Results: Recent Labs    02/06/21 1228 02/07/21 0539  WBC 15.0* 10.2  HGB 12.1* 10.4*  HCT 36.5* 31.9*  PLT 231 187   BMET Recent Labs    02/06/21 1228  NA 137  K 3.6  CL 103  CO2 25  GLUCOSE 108*  BUN 15  CREATININE 0.77  CALCIUM 10.1    Studies/Results: MR Lumbar Spine W Wo Contrast  Result Date: 02/07/2021 CLINICAL DATA:  Low back pain, infection suspected; compression fracture on recent CT, history of prostate cancer EXAM: MRI LUMBAR SPINE WITHOUT AND WITH CONTRAST TECHNIQUE: Multiplanar and multiecho pulse sequences of the lumbar spine were obtained without and with intravenous contrast. CONTRAST:  39mL GADAVIST GADOBUTROL 1 MMOL/ML IV SOLN COMPARISON:  Correlation made with CT 02/06/2021 FINDINGS: Motion artifact is present. Segmentation: Standard. Alignment:  Grade 1 anterolisthesis at L4-L5. Vertebrae: L5 inferior endplate compression fracture with less than 50% loss of height. Marrow edema and enhancement are present. Minimal adjacent ventral epidural enhancement. There is mild marrow edema associated  with L5-S1 facet arthropathy. Degenerative endplate irregularity at L4-L5. Schmorl's node at superior L1 endplate with adjacent STIR hyperintensity and enhancement. Conus medullaris and cauda equina: Conus extends to the L1 level. Conus and cauda equina appear normal. Paraspinal and other soft tissues: Unremarkable. No significant paraspinal edema or mass. Disc levels: L1-L2:  No stenosis. L2-L3: Minimal disc bulge with endplate osteophytic ridging. Mild facet arthropathy. No stenosis. L3-L4: Minimal disc bulge with endplate osteophytic ridging. Mild facet arthropathy. No stenosis. L4-L5: Anterolisthesis with uncovering of disc. Marked facet arthropathy with ligamentum flavum infolding and joint effusions. Small extra-spinal synovial cyst formation. Mild canal stenosis. Moderate right and minor left foraminal stenosis. L5-S1: Moderate facet arthropathy with ligamentum flavum infolding and joint effusions. Small extra-spinal synovial cyst formation. No stenosis. IMPRESSION: Acute to subacute L5 inferior endplate compression fracture with less than 50% loss of height and no significant retropulsion. No definite evidence of underlying lesion. Given prostate cancer history, a follow-up study is recommended. Abnormal signal and enhancement adjacent to L1 Schmorl's node is likely degenerative but can also be reevaluated at the time of follow-up. Facet predominant degenerative changes as detailed above. Electronically Signed   By: Macy Mis M.D.   On: 02/07/2021 15:50   MR PELVIS W WO CONTRAST  Result Date: 02/07/2021 CLINICAL DATA:  Low back and pelvic pain. History of prostate cancer EXAM: MRI PELVIS WITHOUT CONTRAST TECHNIQUE: Multiplanar multisequence MR imaging of the pelvis was performed. No intravenous contrast was administered. Multiplanar multisequence MR imaging of the pelvis was performed both  before and after administration of intravenous contrast. Compatible pacemaker noted, manufacturer guidelines  followed for scanning. Please note that today's exam was performed using routine MRI musculoskeletal pelvis protocol, and a dedicated prostate MRI was not performed. COMPARISON:  CT scan 02/06/2021 FINDINGS: Bones:Inferior endplate compression fracture at L5 is observed for example on image 35 series 17, please see report from dedicated lumbar spine MRI. Bilateral acetabular stress fractures primarily involving the acetabular roof but also the acetabular walls as shown on images 10 through 15 of series 15. In addition, there is subtle focal edema in the left pubic bone and adjacent superior ramus from stress fracture or nondisplaced fracture. The prior hip MRI from 11/04/2020 there was a left acetabular stress fracture, currently there is also symmetric involvement of the right side. Given the morphology and appearance I am skeptical of underlying osseous metastatic disease. Articular cartilage and labrum Articular cartilage:  Degenerative chondral thinning in both hips. Labrum:  N/A Joint or bursal effusion Joint effusion: Absent Bursae: Fluid tracks along the deep surface of the iliacus muscles bilaterally, a nonspecific finding which can be associated with other regional pelvic abnormalities. Muscles and tendons Minimal accentuated T2 signal anteriorly in the gluteus minimus and medius muscles in a symmetric manner, without associated significant abnormal enhancement, likely incidental. There is low-grade edema in the hip adductor musculature for example on image 19 series 15 in a symmetric manner. Other findings Minimal presacral edema. No prostatomegaly. Sigmoid colon diverticulosis. Despite efforts by the technologist and patient, motion artifact is present on today's exam and could not be eliminated. This reduces exam sensitivity and specificity. IMPRESSION: 1. Bilateral acetabular/supra-acetabular stress fractures in the pelvis. There is likewise some stress fracture involving the left medial superior pubic  ramus. This overall represents a progression compared to the previous unilateral left acetabular stress fracture shown on 11/04/2020. 2. L5 compression fracture as reported on recent MRI. 3. There is some low-grade edema along pelvic musculature, most notably the hip adductor musculature and along the deep side of the iliacus muscles. Electronically Signed   By: Van Clines M.D.   On: 02/07/2021 16:17   CT ABDOMEN PELVIS W CONTRAST  Result Date: 02/06/2021 CLINICAL DATA:  Abdominal pain EXAM: CT ABDOMEN AND PELVIS WITH CONTRAST TECHNIQUE: Multidetector CT imaging of the abdomen and pelvis was performed using the standard protocol following bolus administration of intravenous contrast. CONTRAST:  181mL OMNIPAQUE IOHEXOL 300 MG/ML  SOLN COMPARISON:  06/01/2020 FINDINGS: Lower chest: Patchy areas of ground-glass and airspace attenuation identified within the right middle lobe and right lower which appears similar to 12/16/2020. Small scattered lung nodules are again noted imaged portions of the right lung. Hepatobiliary: No focal liver abnormality. Moderate gallbladder distension. No gallstones, gallbladder wall thickening or pericholecystic inflammation. No significant bile duct dilatation. Pancreas: Unremarkable. No pancreatic ductal dilatation or surrounding inflammatory changes. Spleen: Normal in size without focal abnormality. Adrenals/Urinary Tract: Normal adrenal glands. The kidneys are unremarkable. No mass, nephrolithiasis or hydronephrosis. Urinary bladder is unremarkable. Stomach/Bowel: Stomach appears normal. Status post appendectomy. No pathologic dilatation of the large or small bowel loops to suggest recurrent bowel obstruction. Distal colonic diverticula noted without signs of acute diverticulitis. Vascular/Lymphatic: Aortic atherosclerosis without aneurysm. No signs of abdominopelvic adenopathy. Reproductive: Fiducial markers identified within the prostate gland. Other: No free fluid or  fluid collections. Musculoskeletal: The bones appear osteopenic. Degenerative disc disease is identified at L4-5 and L5-S1. New inferior endplate compression deformity is noted involving the L5 vertebral body, image 119/6. IMPRESSION: 1. No  acute findings identified within the abdomen or pelvis. No signs of recurrent bowel obstruction. 2. Ground-glass and airspace densities are noted within the right middle lobe and right lower lobe with scattered small lung nodules. Findings are similar to 12/16/2020 and are favored to represent sequelae of atypical infectious process. 3. New inferior endplate compression deformity involving the L5 vertebral body. 4. Aortic Atherosclerosis (ICD10-I70.0). Electronically Signed   By: Kerby Moors M.D.   On: 02/06/2021 16:30   CT L-SPINE NO CHARGE  Result Date: 02/06/2021 CLINICAL DATA:  Low back pain EXAM: CT LUMBAR SPINE WITHOUT CONTRAST TECHNIQUE: Multidetector CT imaging of the lumbar spine was performed without intravenous contrast administration. Multiplanar CT image reconstructions were also generated. COMPARISON:  Correlation made with CT abdomen March 2022 FINDINGS: Segmentation: 5 lumbar type vertebrae. Alignment: Similar grade 1 anterolisthesis at L4-L5. Vertebrae: There is new loss of height at the inferior endplate of L5. Height loss is less than 50%. Chronic large Schmorl's node of the L1 superior endplate. Paraspinal and other soft tissues: Extra-spinal findings are better evaluated on concurrent dedicated imaging. Disc levels: Multilevel disc space narrowing. Vacuum disc phenomena at L4-L5 and L5-S1. Facet hypertrophy is greatest at L4-L5 and L5-S1. There is no high-grade canal narrowing identified. Mild foraminal narrowing at lower lumbar levels. IMPRESSION: Age-indeterminate compression fracture at the inferior endplate of L5 with less than 50% loss height (new since March 2022). Degenerative changes without high-grade stenosis. Lower lumbar facet degeneration.  Electronically Signed   By: Macy Mis M.D.   On: 02/06/2021 16:26   DG Chest Port 1 View  Result Date: 02/06/2021 CLINICAL DATA:  Fever. EXAM: PORTABLE CHEST 1 VIEW COMPARISON:  August 13, 2020. FINDINGS: Stable cardiomediastinal silhouette. 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 Decreased right basilar opacity is noted suggesting improving atelectasis or infiltrate left lung is clear. Bony thorax is unremarkable. IMPRESSION: Improved right basilar opacity as described above. Electronically Signed   By: Marijo Conception M.D.   On: 02/06/2021 14:32     I have reviewed the patient's lumbar MRI performed with and without contrast at [facility] today.  The patient has an L5 compression fracture.  I do not see any tumors or indications of infection.  He also has bilateral acetabular fractures. Assessment/Plan: L5 compression fracture, lumbago: I have discussed the situation with the patient and his wife.  We discussed the various treatment options including continued medical management and giving time versus an L5 kyphoplasty.  I have described a kyphoplasty.  We have discussed the risks including risk of anesthesia, hemorrhage, infection, malplacement of the bone cement, adjacent segment fractures, failure to relieve the pain, worsening pain, medical risk, etc.  We have also discussed the expected postoperative course and the likelihood of achieving our goals with surgery.  I have answered all their questions.  He wants to proceed with a kyphoplasty.  We will plan to do this on Monday.  Bilateral acetabular fractures: I would suggest a orthopedic surgery consult from Dr. [facility].  LOS: 0 days     [name] 02/07/2021, 6:05 PM

## 2021-02-07 NOTE — Consult Note (Signed)
Reason for Consult: Back pain Referring Physician: Dr. Jonelle Sports is an 75 y.o. male.  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  He does not recall any precipitating events.  He says his pain got much worse on 01/19/2021 after he did a week long mediation.  He has seen Dr. Mertha Finders and was treated with a cortisone injection and oral steroids.  Unfortunately he did not improve.  He also saw Dr. Lyla Glassing who evaluated his hips and said they were fine.  His pain became worse yesterday and he came to the ER and was admitted.  Presently the patient is alert and pleasant.  He is in no apparent distress.  He complains of bilateral back pain at the lumbosacral junction.  He denies pain numbness tingling weakness into his buttocks or legs.  He has had no incontinence.  The patient has a history of prostate cancer which was treated with radiation and has no known metastasis to date.  The patient is on Eliquis for A. fib.  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)     Past Surgical History:  Procedure Laterality Date   APPENDECTOMY     age 58   CARDIOVERSION  02/16/2017   CARDIOVERSION N/A 02/16/2017   Procedure: CARDIOVERSION;  Surgeon: Thompson Grayer, MD;  Location: [place];  Service: Cardiovascular;  Laterality: N/A;   COLONOSCOPY WITH PROPOFOL N/A 12/13/2012   Procedure: COLONOSCOPY WITH PROPOFOL;  Surgeon: Garlan Fair, MD;  Location: [facility];  Service: Endoscopy;  Laterality: N/A;   FRACTURE SURGERY     HYDROCELE EXCISION Left 05/16/2018    Procedure: HYDROCELECTOMY ADULT;  Surgeon: [name], MD;  Location: [facility];  Service: Urology;  Laterality: Left;   INSERT / REPLACE / REMOVE PACEMAKER  02/16/2017   LAPAROSCOPY N/A 06/04/2020   Procedure: POSSIBLE LAPAROTOMY POSSIBLE BOWEL RESSECTION;  Surgeon: Clovis Riley, MD;  Location: [facility];  Service: General;  Laterality: N/A;   PACEMAKER IMPLANT N/A 02/16/2017   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 His smoking use included cigarettes. He has a 0.96 pack-year smoking history. He has never used smokeless tobacco. He reports current alcohol use. He reports that he does not use drugs.  Allergies:  Allergies  Allergen Reactions   Atorvastatin Palpitations  Ezetimibe Palpitations    Medications: I have reviewed the patient's current medications. Prior to Admission: (Not in a hospital admission)  Scheduled:  apixaban  5 mg Oral BID   furosemide  20 mg Oral Daily   multivitamin with minerals  1 tablet Oral Daily   pantoprazole  40 mg Oral Daily   polyethylene glycol  17 g Oral Daily   rosuvastatin  10 mg Oral q morning   senna  1 tablet Oral Daily   tamsulosin  0.4 mg Oral Daily   Continuous: RXV:QMGQQPY carbonate, oxyCODONE, polyvinyl alcohol, sodium chloride Anti-infectives (From admission, onward)    None        Results for orders placed or performed during the hospital encounter of 02/06/21 (from the past  48 hour(s))  CBC with Differential     Status: Abnormal   Collection Time: 02/06/21 12:28 PM  Result Value Ref Range   WBC 15.0 (H) 4.0 - 10.5 K/uL   RBC 3.90 (L) 4.22 - 5.81 MIL/uL   Hemoglobin 12.1 (L) 13.0 - 17.0 g/dL   HCT 36.5 (L) 39.0 - 52.0 %   MCV 93.6 80.0 - 100.0 fL   MCH 31.0 26.0 - 34.0 pg   MCHC 33.2 30.0 - 36.0 g/dL   RDW 14.3 11.5 - 15.5 %   Platelets 231 150 - 400 K/uL   nRBC 0.0 0.0 - 0.2 %   Neutrophils Relative % 92 %   Neutro Abs 13.7 (H) 1.7 - 7.7 K/uL   Lymphocytes Relative 2 %   Lymphs Abs 0.4 (L) 0.7 - 4.0 K/uL   Monocytes Relative 5 %   Monocytes Absolute 0.8 0.1 - 1.0 K/uL   Eosinophils Relative 0 %   Eosinophils Absolute 0.0 0.0 - 0.5 K/uL   Basophils Relative 0 %   Basophils Absolute 0.0 0.0 - 0.1 K/uL   Immature Granulocytes 1 %   Abs Immature Granulocytes 0.14 (H) 0.00 - 0.07 K/uL    Comment: Performed at [facility], [address] [place], [place] [address]  Comprehensive metabolic panel     Status: Abnormal   Collection Time: 02/06/21 12:28 PM  Result Value Ref Range   Sodium 137 135 - 145 mmol/L   Potassium 3.6 3.5 - 5.1 mmol/L   Chloride 103 98 - 111 mmol/L   CO2 25 22 - 32 mmol/L   Glucose, Bld 108 (H) 70 - 99 mg/dL    Comment: Glucose reference range applies only to samples taken after fasting for at least 8 hours.   BUN 15 8 - 23 mg/dL   Creatinine, Ser 0.77 0.61 - 1.24 mg/dL   Calcium 10.1 8.9 - 10.3 mg/dL   Total Protein 6.5 6.5 - 8.1 g/dL   Albumin 3.7 3.5 - 5.0 g/dL   AST 26 15 - 41 U/L   ALT 24 0 - 44 U/L   Alkaline Phosphatase 91 38 - 126 U/L   Total Bilirubin 1.5 (H) 0.3 - 1.2 mg/dL   GFR, Estimated >60 >60 mL/min    Comment: (NOTE) Calculated using the CKD-EPI Creatinine Equation (2021)    Anion gap 9 5 - 15    Comment: Performed at [facility] [address], [place], [place] [address]  Lipase, blood     Status: None   Collection Time: 02/06/21 12:28 PM  Result Value Ref Range   Lipase 31 11 - 51 U/L     Comment: Performed at [facility] [address]  [place], [place] [address]  Protime-INR     Status: None   Collection Time: 02/06/21 12:28 PM  Result Value Ref Range   Prothrombin Time 13.6 11.4 - 15.2 seconds   INR 1.0 0.8 - 1.2    Comment: (NOTE) INR goal varies based on device and disease states. Performed at [facility], [place] [address] [place], [place] [address]   ABO/Rh     Status: None   Collection Time: 02/06/21 12:45 PM  Result Value Ref Range   ABO/RH(D)      O POS Performed at [facility] [address] [place], [place] [address]   Type and screen [place]     Status: None   Collection Time: 02/06/21 12:50 PM  Result Value Ref Range   ABO/RH(D) O POS    Antibody Screen NEG    Sample Expiration      02/09/2021,2359 Performed at [facility], [place] [address] [place], [place] [address]   Resp Panel by RT-PCR (Flu A&B, Covid) Nasopharyngeal Swab     Status: None   Collection Time: 02/06/21  2:05 PM   Specimen: Nasopharyngeal Swab; Nasopharyngeal(NP) swabs in vial transport medium  Result Value Ref Range   SARS Coronavirus 2 by RT PCR NEGATIVE NEGATIVE    Comment: (NOTE) SARS-CoV-2 target nucleic acids are NOT DETECTED.  The SARS-CoV-2 RNA is generally detectable in upper respiratory specimens during the acute phase of infection. The lowest concentration of SARS-CoV-2 viral copies this assay can detect is 138 copies/mL. A negative result does not preclude SARS-Cov-2 infection and should not be used as the sole basis for treatment or other patient management decisions. A negative result may occur with  improper specimen collection/handling, submission of specimen other than nasopharyngeal swab, presence of viral mutation(s) within the areas targeted by this assay, and inadequate number of viral copies(<138 copies/mL). A negative result must be combined with clinical observations, patient history, and  epidemiological information. The expected result is Negative.  Fact Sheet for Patients:  EntrepreneurPulse.com.au  Fact Sheet for Healthcare Providers:  IncredibleEmployment.be  This test is no t yet approved or cleared by the [place] FDA and  has been authorized for detection and/or diagnosis of SARS-CoV-2 by FDA under an Emergency Use Authorization (EUA). This EUA will remain  in effect (meaning this test can be used) for the duration of the COVID-19 declaration under Section 564(b)(1) of the Act, 21 U.S.C.section 360bbb-3(b)(1), unless the authorization is terminated  or revoked sooner.       Influenza A by PCR NEGATIVE NEGATIVE   Influenza B by PCR NEGATIVE NEGATIVE    Comment: (NOTE) The Xpert Xpress SARS-CoV-2/FLU/RSV plus assay is intended as an aid in the diagnosis of influenza from Nasopharyngeal swab specimens and should not be used as a sole basis for treatment. Nasal washings and aspirates are unacceptable for Xpert Xpress SARS-CoV-2/FLU/RSV testing.  Fact Sheet for Patients: EntrepreneurPulse.com.au  Fact Sheet for Healthcare Providers: IncredibleEmployment.be  This test is not yet approved or cleared by the [place] FDA and has been authorized for detection and/or diagnosis of SARS-CoV-2 by FDA under an Emergency Use Authorization (EUA). This EUA will remain in effect (meaning this test can be used) for the duration of the COVID-19 declaration under Section 564(b)(1) of the Act, 21 U.S.C. section 360bbb-3(b)(1), unless the authorization is terminated or revoked.  Performed at [facility], [place] [address], [place], [place] [address]   Urinalysis, Routine w reflex  microscopic Urine, Clean Catch     Status: None   Collection Time: 02/06/21  7:17 PM  Result Value Ref Range   Color, Urine YELLOW YELLOW   APPearance CLEAR CLEAR   Specific Gravity, Urine 1.010 1.005 - 1.030    pH 7.0 5.0 - 8.0   Glucose, UA NEGATIVE NEGATIVE mg/dL   Hgb urine dipstick NEGATIVE NEGATIVE   Bilirubin Urine NEGATIVE NEGATIVE   Ketones, ur NEGATIVE NEGATIVE mg/dL   Protein, ur NEGATIVE NEGATIVE mg/dL   Nitrite NEGATIVE NEGATIVE   Leukocytes,Ua NEGATIVE NEGATIVE    Comment: Microscopic not done on urines with negative protein, blood, leukocytes, nitrite, or glucose < 500 mg/dL. Performed at [facility], [place] [address], [place], [place] [address]   CBC     Status: Abnormal   Collection Time: 02/07/21  5:39 AM  Result Value Ref Range   WBC 10.2 4.0 - 10.5 K/uL   RBC 3.36 (L) 4.22 - 5.81 MIL/uL   Hemoglobin 10.4 (L) 13.0 - 17.0 g/dL   HCT 31.9 (L) 39.0 - 52.0 %   MCV 94.9 80.0 - 100.0 fL   MCH 31.0 26.0 - 34.0 pg   MCHC 32.6 30.0 - 36.0 g/dL   RDW 14.4 11.5 - 15.5 %   Platelets 187 150 - 400 K/uL   nRBC 0.0 0.0 - 0.2 %    Comment: Performed at [facility], [place] [address], [place], [place] [address]    CT ABDOMEN PELVIS W CONTRAST  Result Date: 02/06/2021 CLINICAL DATA:  Abdominal pain EXAM: CT ABDOMEN AND PELVIS WITH CONTRAST TECHNIQUE: Multidetector CT imaging of the abdomen and pelvis was performed using the standard protocol following bolus administration of intravenous contrast. CONTRAST:  177mL OMNIPAQUE IOHEXOL 300 MG/ML  SOLN COMPARISON:  06/01/2020 FINDINGS: Lower chest: Patchy areas of ground-glass and airspace attenuation identified within the right middle lobe and right lower which appears similar to 12/16/2020. Small scattered lung nodules are again noted imaged portions of the right lung. Hepatobiliary: No focal liver abnormality. Moderate gallbladder distension. No gallstones, gallbladder wall thickening or pericholecystic inflammation. No significant bile duct dilatation. Pancreas: Unremarkable. No pancreatic ductal dilatation or surrounding inflammatory changes. Spleen: Normal in size without focal abnormality. Adrenals/Urinary Tract: Normal adrenal  glands. The kidneys are unremarkable. No mass, nephrolithiasis or hydronephrosis. Urinary bladder is unremarkable. Stomach/Bowel: Stomach appears normal. Status post appendectomy. No pathologic dilatation of the large or small bowel loops to suggest recurrent bowel obstruction. Distal colonic diverticula noted without signs of acute diverticulitis. Vascular/Lymphatic: Aortic atherosclerosis without aneurysm. No signs of abdominopelvic adenopathy. Reproductive: Fiducial markers identified within the prostate gland. Other: No free fluid or fluid collections. Musculoskeletal: The bones appear osteopenic. Degenerative disc disease is identified at L4-5 and L5-S1. New inferior endplate compression deformity is noted involving the L5 vertebral body, image 119/6. IMPRESSION: 1. No acute findings identified within the abdomen or pelvis. No signs of recurrent bowel obstruction. 2. Ground-glass and airspace densities are noted within the right middle lobe and right lower lobe with scattered small lung nodules. Findings are similar to 12/16/2020 and are favored to represent sequelae of atypical infectious process. 3. New inferior endplate compression deformity involving the L5 vertebral body. 4. Aortic Atherosclerosis (ICD10-I70.0). Electronically Signed   By: Kerby Moors M.D.   On: 02/06/2021 16:30   CT L-SPINE NO CHARGE  Result Date: 02/06/2021 CLINICAL DATA:  Low back pain EXAM: CT LUMBAR SPINE WITHOUT CONTRAST TECHNIQUE: Multidetector CT imaging of the lumbar spine was performed without  intravenous contrast administration. Multiplanar CT image reconstructions were also generated. COMPARISON:  Correlation made with CT abdomen March 2022 FINDINGS: Segmentation: 5 lumbar type vertebrae. Alignment: Similar grade 1 anterolisthesis at L4-L5. Vertebrae: There is new loss of height at the inferior endplate of L5. Height loss is less than 50%. Chronic large Schmorl's node of the L1 superior endplate. Paraspinal and other  soft tissues: Extra-spinal findings are better evaluated on concurrent dedicated imaging. Disc levels: Multilevel disc space narrowing. Vacuum disc phenomena at L4-L5 and L5-S1. Facet hypertrophy is greatest at L4-L5 and L5-S1. There is no high-grade canal narrowing identified. Mild foraminal narrowing at lower lumbar levels. IMPRESSION: Age-indeterminate compression fracture at the inferior endplate of L5 with less than 50% loss height (new since March 2022). Degenerative changes without high-grade stenosis. Lower lumbar facet degeneration. Electronically Signed   By: Macy Mis M.D.   On: 02/06/2021 16:26   DG Chest Port 1 View  Result Date: 02/06/2021 CLINICAL DATA:  Fever. EXAM: PORTABLE CHEST 1 VIEW COMPARISON:  August 13, 2020. FINDINGS: Stable cardiomediastinal silhouette. 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 Decreased right basilar opacity is noted suggesting improving atelectasis or infiltrate left lung is clear. Bony thorax is unremarkable. IMPRESSION: Improved right basilar opacity as described above. Electronically Signed   By: Marijo Conception M.D.   On: 02/06/2021 14:32    ROS: As above Blood pressure 126/90, pulse 93, temperature 98.6 F (37 C), resp. rate (!) 23, height 6\' 2"  (1.88 m), weight 92.1 kg, SpO2 99 %. Estimated body mass index is 26.06 kg/m as calculated from the following:   Height as of this encounter: 6\' 2"  (1.88 m).   Weight as of this encounter: 92.1 kg.  Physical Exam  General: An alert and pleasant 75 year old white male in no apparent distress  HEENT: Alopecia, extraocular muscles are intact  Neck: Unremarkable, normal range of motion for age, Spurling's testing is negative.  Thorax: Symmetric  Abdomen: Soft  Extremities: Unremarkable  Back exam: Faber's testing and straight leg raise testing are negative bilaterally  Neurologic exam: The patient is alert and oriented x3, cranial nerves are grossly normal, the patient's motor strength is 5/5  in his bilateral bicep, tricep, handgrip, quadricep, gastrocnemius and dorsiflexors.  Cerebellar function is intact to rapid altering movements of the upper extremities bilaterally.  Sensory function is intact to light touch sensation in all tested dermatomes bilaterally.  Imaging studies I reviewed the patient's lumbar CT performed yesterday.  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  He has a mild L5 compression fracture which is new since a prior study in March 2022.  Assessment/Plan: Lumbago, L5 compression fracture, L4-5 spondylolisthesis: I have discussed the situation with the patient.  It sounds like his chronic back pain may come from his mild degenerative changes and spondylolisthesis.  His acute pain may be coming from this L5 compression fracture.  I am a bit concerned about a compression fracture in a male with a history of prostate cancer without significant trauma.  We will add MRI with contrast to his order and await the MRI.  I will advise the patient further after his MRI.  I have answered all his questions.  [name] 02/07/2021, 7:30 AM

## 2021-02-07 NOTE — ED Notes (Signed)
Patient updated on plan of care.  Reports pain has returned.

## 2021-02-07 NOTE — ED Notes (Signed)
Pt in MRI right now un able to obtain vitals

## 2021-02-07 NOTE — Progress Notes (Signed)
Family Medicine Teaching Service Daily Progress Note Intern Pager: [phone]  Patient name: Todd Chapman Medical record number: [id number] Date of birth: [birth year] Age: 75 y.o. Gender: male  Primary Care Provider: [name], MD Consultants: Neurosurgery Code Status: Full  Pt Overview and Major Events to Date:  12/1: admitted to [place], CT L-spine with L5 compression fx 12/2: MR L spine and pelvis bilateral acetabular stress fxs, neurosurgery consulted 12/3: Ortho consulted  Assessment and Plan: Todd Chapman is a 75 y.o.male who presented with worsening back pain.  Past medical history of A. fib on Eliquis status post pacemaker placement, prostate cancer status postradiation, HLD, GERD.  L5 compression fracture  bilateral acetabular stress fractures Patient presented with worsening back pain without neurological involvement proven to be combination of L5 compression fracture and bilateral acetabular stress fractures noted on CT and MR respectively.  Pain has been difficult to control.  Neurosurgery was consulted and have agreed to kyphoplasty on Monday. -Schedule oxycodone IR 10 mg every 4 hours -Schedule tylenol 650mg  every 6 hours -Continue calcitonin nasal spray daily -PT/OT following, appreciate assistance -Neurosurgery following, appreciate recs -Consult [facility]  All other chronic, stable medical conditions (A. fib, HLD, GERD) medically managed with home medications as appropriate.   FEN/GI: Heart healthy PPx: Eliquis Dispo:  pending kyphoplasty on Monday .   Subjective:  Pt states the Oxy 10mg  is working much better but he is needing it every 4 hours. States it only really works for 1.5 hours and then he starts to [employer]. He is amenable to consulting ortho. States he has not had BM yet but feels it will happen today.   Objective: Temp:  [97.8 F (36.6 C)-98.9 F (37.2 C)] 98.3 F (36.8 C) (12/03 0536) Pulse Rate:  [62-86] 86 (12/03 0536) Resp:  [14-20] 18  (12/03 0536) BP: (117-162)/(75-97) 162/92 (12/03 0536) SpO2:  [83 %-100 %] 83 % (12/03 0536) Physical Exam: General: awake, alert, NAD Cardiovascular: RRR, no murmurs auscultated Respiratory: CTAB, no increased work of breathing  Laboratory: Recent Labs  Lab 02/06/21 1228 02/07/21 0539 02/08/21 0331  WBC 15.0* 10.2 7.6  HGB 12.1* 10.4* 9.7*  HCT 36.5* 31.9* 29.9*  PLT 231 187 191   Recent Labs  Lab 02/06/21 1228 02/08/21 0331  NA 137  --   K 3.6  --   CL 103  --   CO2 25  --   BUN 15  --   CREATININE 0.77  --   CALCIUM 10.1  --   PROT 6.5 5.5*  BILITOT 1.5* 0.9  ALKPHOS 91 76  ALT 24 22  AST 26 24  GLUCOSE 108*  --     Imaging/Diagnostic Tests: MR Lumbar Spine W Wo Contrast  Result Date: 02/07/2021 CLINICAL DATA:  Low back pain, infection suspected; compression fracture on recent CT, history of prostate cancer EXAM: MRI LUMBAR SPINE WITHOUT AND WITH CONTRAST TECHNIQUE: Multiplanar and multiecho pulse sequences of the lumbar spine were obtained without and with intravenous contrast. CONTRAST:  77mL GADAVIST GADOBUTROL 1 MMOL/ML IV SOLN COMPARISON:  Correlation made with CT 02/06/2021 FINDINGS: Motion artifact is present. Segmentation: Standard. Alignment:  Grade 1 anterolisthesis at L4-L5. Vertebrae: L5 inferior endplate compression fracture with less than 50% loss of height. Marrow edema and enhancement are present. Minimal adjacent ventral epidural enhancement. There is mild marrow edema associated with L5-S1 facet arthropathy. Degenerative endplate irregularity at L4-L5. Schmorl's node at superior L1 endplate with adjacent STIR hyperintensity and enhancement. Conus medullaris and cauda equina: Conus  extends to the L1 level. Conus and cauda equina appear normal. Paraspinal and other soft tissues: Unremarkable. No significant paraspinal edema or mass. Disc levels: L1-L2:  No stenosis. L2-L3: Minimal disc bulge with endplate osteophytic ridging. Mild facet arthropathy. No  stenosis. L3-L4: Minimal disc bulge with endplate osteophytic ridging. Mild facet arthropathy. No stenosis. L4-L5: Anterolisthesis with uncovering of disc. Marked facet arthropathy with ligamentum flavum infolding and joint effusions. Small extra-spinal synovial cyst formation. Mild canal stenosis. Moderate right and minor left foraminal stenosis. L5-S1: Moderate facet arthropathy with ligamentum flavum infolding and joint effusions. Small extra-spinal synovial cyst formation. No stenosis. IMPRESSION: Acute to subacute L5 inferior endplate compression fracture with less than 50% loss of height and no significant retropulsion. No definite evidence of underlying lesion. Given prostate cancer history, a follow-up study is recommended. Abnormal signal and enhancement adjacent to L1 Schmorl's node is likely degenerative but can also be reevaluated at the time of follow-up. Facet predominant degenerative changes as detailed above. Electronically Signed   By: Macy Mis M.D.   On: 02/07/2021 15:50   MR PELVIS W WO CONTRAST  Result Date: 02/07/2021 CLINICAL DATA:  Low back and pelvic pain. History of prostate cancer EXAM: MRI PELVIS WITHOUT CONTRAST TECHNIQUE: Multiplanar multisequence MR imaging of the pelvis was performed. No intravenous contrast was administered. Multiplanar multisequence MR imaging of the pelvis was performed both before and after administration of intravenous contrast. Compatible pacemaker noted, manufacturer guidelines followed for scanning. Please note that today's exam was performed using routine MRI musculoskeletal pelvis protocol, and a dedicated prostate MRI was not performed. COMPARISON:  CT scan 02/06/2021 FINDINGS: Bones:Inferior endplate compression fracture at L5 is observed for example on image 35 series 17, please see report from dedicated lumbar spine MRI. Bilateral acetabular stress fractures primarily involving the acetabular roof but also the acetabular walls as shown on images  10 through 15 of series 15. In addition, there is subtle focal edema in the left pubic bone and adjacent superior ramus from stress fracture or nondisplaced fracture. The prior hip MRI from 11/04/2020 there was a left acetabular stress fracture, currently there is also symmetric involvement of the right side. Given the morphology and appearance I am skeptical of underlying osseous metastatic disease. Articular cartilage and labrum Articular cartilage:  Degenerative chondral thinning in both hips. Labrum:  N/A Joint or bursal effusion Joint effusion: Absent Bursae: Fluid tracks along the deep surface of the iliacus muscles bilaterally, a nonspecific finding which can be associated with other regional pelvic abnormalities. Muscles and tendons Minimal accentuated T2 signal anteriorly in the gluteus minimus and medius muscles in a symmetric manner, without associated significant abnormal enhancement, likely incidental. There is low-grade edema in the hip adductor musculature for example on image 19 series 15 in a symmetric manner. Other findings Minimal presacral edema. No prostatomegaly. Sigmoid colon diverticulosis. Despite efforts by the technologist and patient, motion artifact is present on today's exam and could not be eliminated. This reduces exam sensitivity and specificity. IMPRESSION: 1. Bilateral acetabular/supra-acetabular stress fractures in the pelvis. There is likewise some stress fracture involving the left medial superior pubic ramus. This overall represents a progression compared to the previous unilateral left acetabular stress fracture shown on 11/04/2020. 2. L5 compression fracture as reported on recent MRI. 3. There is some low-grade edema along pelvic musculature, most notably the hip adductor musculature and along the deep side of the iliacus muscles. Electronically Signed   By: Van Clines M.D.   On: 02/07/2021 16:17  Wells Guiles, DO 02/08/2021, 7:16 AM PGY-1, [place] Intern pager: [phone], text pages welcome

## 2021-02-07 NOTE — ED Notes (Signed)
Admitting MD made aware of patient's pain.

## 2021-02-07 NOTE — Progress Notes (Signed)
Per order, Changed device settings to VOO 95 for MRI scan.   Will program device back to regular settings after scan complete and send transmission

## 2021-02-07 NOTE — Progress Notes (Addendum)
Family Medicine Teaching Service Daily Progress Note Intern Pager: [phone]  Patient name: Todd Chapman Medical record number: [id number] Date of birth: November 27, 1945 Age: 75 y.o. Gender: male  Primary Care Provider: [name], MD Consultants: Neurosurgery Code Status: Full  Pt Overview and Major Events to Date:  12/1- admitted  Assessment and Plan: Mr. Todd Chapman is a 75yo male with a history of A Fib on Eliquis s/p pacemaker placement, Prostate CA s/p radiation, HLD, GERD who presented with back pain, found to have compression fracture of L5.   Back Pain:  Isolated fever at 1200 yesterday, has remained afebrile since. VSS. Compression fx of L5 noted on CT but differential includes pain from fracture, epidural abscess given WBC and fever, and malignant process given CA hx and radiation to the area. Pain is inadequately controlled on Oxy 5mg  and s/p Dilaudid x2. Patient reports that he has not been able to sleep or get comfortable. Dilaudid helps but is short-lived. WBC normalized to 10.2 this am. Not able to get MRI yesterday due to lack of staff available to put pacemaker in MRI safe mode. Patient also reports a history of anxiety/claustrophobia with MRIs. Will add ativan order for procedure.   Seen by Dr. Arnoldo Chapman, neurosurgery this am. No changes to management at this time until MRI is back.  - MRI lumbar spine and pelvis W WO contrast - PRN ativan ordered for MRI - Will increase Oxycodone to 10mg  q4h PRN - Scheduled tylenol - Add IN calcitonin for improved pain control  - PT/OT - Neurosurgery following, appreciate recommendations  Permanent A Fib on Eliquis s/p pacemaker Rate controlled. - Continue Eliquis 5mg  BID  LE Edema, chronic Euvolemic on exam. No edema today. - Continue Lasix 20mg  daily  HLD - Continue Crestor 10mg  daily  GERD - Continue Protonix 40mg  daily   Pulmonary fibrosis, chronic stable ORA.  FEN/GI: Heart healthy PPx: On Eliquis Dispo: Pending MRI     Subjective:  Mr. Todd Chapman reports inadequately controlled pain this morning. He is having trouble getting comfortable in bed and states that it affected his sleep overnight.   Objective: Temp:  [97.7 F (36.5 C)-102.2 F (39 C)] 98.3 F (36.8 C) (12/01 2345) Pulse Rate:  [61-162] 82 (12/02 0500) Resp:  [13-29] 19 (12/02 0500) BP: (100-149)/(62-112) 113/100 (12/02 0500) SpO2:  [94 %-100 %] 95 % (12/02 0500) Weight:  [92.1 kg] 92.1 kg (12/01 1909) Physical Exam: General: Uncomfortable appearing lying in bed, but otherwise NAD Cardiovascular: Irregularly irregular, no murmur Respiratory: Lungs CTAB, nl WOB on RA Abdomen: Soft, non-tender, non-distended MSK: Bony tenderness of lower lumbar spine  Neuro: Decreased strength with knee flexion and ankle dorsiflexion of RLE seems to be limited by lumbar pain, sensation intact through BLE  Laboratory: Recent Labs  Lab 02/06/21 1228 02/07/21 0539  WBC 15.0* 10.2  HGB 12.1* 10.4*  HCT 36.5* 31.9*  PLT 231 187   Recent Labs  Lab 02/06/21 1228  NA 137  K 3.6  CL 103  CO2 25  BUN 15  CREATININE 0.77  CALCIUM 10.1  PROT 6.5  BILITOT 1.5*  ALKPHOS 91  ALT 24  AST 26  GLUCOSE 108*    Imaging/Diagnostic Tests: MRI Lumbar spine and pelvir w wo contrast pending   Eppie Gibson, MD 02/07/2021, 6:38 AM PGY-1, [place] Intern pager: [phone], text pages welcome

## 2021-02-07 NOTE — ED Notes (Signed)
Patient updated on plan of care

## 2021-02-07 NOTE — ED Notes (Signed)
Patient requesting more pain medication.  Reports pain has increased.  Will medicate with PRN order

## 2021-02-07 NOTE — Evaluation (Signed)
Occupational Therapy Evaluation Patient Details Name: Todd Chapman MRN: [id number] DOB: Nov 08, 1945 Today's Date: 02/07/2021   History of Present Illness Pt is a 75 year old man admitted on 02/06/21 with worsening back pain. 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 PMH: afib on Eliquis, pacemaker, HLD, SBO, prostate ca s/p radiation.   Clinical Impression   Pt typically functions independently and is active. He works as an Forensic psychologist. Pt presents with back pain as primary limiting factor in mobility and ADL independence. Pt stood with RW with verbal cues, heavy reliance on UEs and min assist, second person for safety. Pain prevents ambulation. Pt requires set up to min assist for ADL. Educated in multiple uses of 3 in 1 and benefits of RW. Pt has a multilevel home and may need to remain on main level to avoid stairs, hospital bed recommended. Will follow acutely.      Recommendations for follow up therapy are one component of a multi-disciplinary discharge planning process, led by the attending physician.  Recommendations may be updated based on patient status, additional functional criteria and insurance authorization.   Follow Up Recommendations  Home health OT    Assistance Recommended at Discharge    Functional Status Assessment  Patient has had a recent decline in their functional status and demonstrates the ability to make significant improvements in function in a reasonable and predictable amount of time.  Equipment Recommendations  BSC/3in1;Hospital bed    Recommendations for Other Services       Precautions / Restrictions Precautions Precautions: Fall;Back Precaution Booklet Issued: No Precaution Comments: Verbally reviewed back precautions Restrictions Weight Bearing Restrictions: No      Mobility Bed Mobility Overal bed mobility: Needs Assistance Bed Mobility: Rolling;Sidelying to Sit;Sit to Sidelying Rolling:  Supervision Sidelying to sit: Supervision     Sit to sidelying: Supervision General bed mobility comments: Supervision for safety. Cues for log roll technique    Transfers Overall transfer level: Needs assistance Equipment used: Rolling walker (2 wheels) Transfers: Sit to/from Stand Sit to Stand: Min assist;+2 physical assistance           General transfer comment: Min A +2 for steadying to stand. Pt requiring 2-3 attempts to stand fully upright secondary to pain. Pt requiring min guard A to perform side steps at EOB. Unable to tolerate further mobility secondary to pain.      Balance Overall balance assessment: Needs assistance Sitting-balance support: No upper extremity supported;Feet supported Sitting balance-Leahy Scale: Fair     Standing balance support: Bilateral upper extremity supported Standing balance-Leahy Scale: Poor Standing balance comment: Heavy reliance on BUE support                           ADL either performed or assessed with clinical judgement   ADL Overall ADL's : Needs assistance/impaired Eating/Feeding: Independent;Bed level   Grooming: Set up;Sitting   Upper Body Bathing: Set up;Sitting   Lower Body Bathing: Minimal assistance;Sit to/from stand;+2 for safety/equipment   Upper Body Dressing : Set up;Sitting   Lower Body Dressing: Minimal assistance;Sit to/from stand;+2 for safety/equipment Lower Body Dressing Details (indicate cue type and reason): able to cross foot over opposite knee to don socks Toilet Transfer: +2 for safety/equipment;Rolling walker (2 wheels);Minimal assistance   Toileting- Clothing Manipulation and Hygiene: Minimal assistance;Sit to/from stand       Functional mobility during ADLs: Minimal assistance;Cueing for safety;Rolling walker (2 wheels)  Vision Ability to See in Adequate Light: 0 Adequate Patient Visual Report: No change from baseline       Perception     Praxis      Pertinent  Vitals/Pain Pain Assessment: Faces Faces Pain Scale: Hurts whole lot Pain Location: back Pain Descriptors / Indicators: Grimacing;Guarding Pain Intervention(s): Monitored during session;Premedicated before session;Repositioned     Hand Dominance Right   Extremity/Trunk Assessment Upper Extremity Assessment Upper Extremity Assessment: Overall WFL for tasks assessed   Lower Extremity Assessment Lower Extremity Assessment: Defer to PT evaluation   Cervical / Trunk Assessment Cervical / Trunk Assessment: Other exceptions Cervical / Trunk Exceptions: L5 compression fx   Communication Communication Communication: No difficulties   Cognition Arousal/Alertness: Awake/alert Behavior During Therapy: WFL for tasks assessed/performed Overall Cognitive Status: Within Functional Limits for tasks assessed                                       General Comments       Exercises     Shoulder Instructions      Home Living Family/patient expects to be discharged to:: Private residence Living Arrangements: Spouse/significant other Available Help at Discharge: Family Type of Home: House Home Access: Stairs to enter Technical brewer of Steps: 2 Entrance Stairs-Rails: None Home Layout: Multi-level;Able to live on main level with bedroom/bathroom Alternate Level Stairs-Number of Steps: 14 Alternate Level Stairs-Rails: Right Bathroom Shower/Tub: Occupational psychologist: Handicapped height     Home Equipment: [place] - single point;Rollator (4 wheels)          Prior Functioning/Environment Prior Level of Function : Independent/Modified Independent             Mobility Comments: Has been using cane for ambulation ADLs Comments: independent        OT Problem List: Impaired balance (sitting and/or standing);Pain;Decreased knowledge of use of DME or AE      OT Treatment/Interventions: Self-care/ADL training;DME and/or AE instruction;Patient/family  education;Balance training;Therapeutic activities    OT Goals(Current goals can be found in the care plan section) Acute Rehab OT Goals OT Goal Formulation: With patient Time For Goal Achievement: 02/21/21 Potential to Achieve Goals: Good ADL Goals Pt Will Perform Grooming: with supervision;standing;sitting Pt Will Perform Lower Body Bathing: with supervision;sit to/from stand Pt Will Perform Lower Body Dressing: with supervision;sit to/from stand Pt Will Transfer to Toilet: with supervision;ambulating;bedside commode (over toilet) Pt Will Perform Toileting - Clothing Manipulation and hygiene: with supervision;sit to/from stand Pt Will Perform Tub/Shower Transfer: Shower transfer;ambulating;shower seat;rolling walker;with supervision Additional ADL Goal #1: Pt will employ back precaution in mobility and ADL for comfort.  OT Frequency: Min 2X/week   Barriers to D/C:            Co-evaluation PT/OT/SLP Co-Evaluation/Treatment: Yes Reason for Co-Treatment: Complexity of the patient's impairments (multi-system involvement);For patient/therapist safety   OT goals addressed during session: ADL's and self-care      AM-PAC OT "6 Clicks" Daily Activity     Outcome Measure Help from another person eating meals?: None Help from another person taking care of personal grooming?: A Little   Help from another person bathing (including washing, rinsing, drying)?: A Little Help from another person to put on and taking off regular upper body clothing?: A Little Help from another person to put on and taking off regular lower body clothing?: A Little 6 Click Score: 16   End of  Session Equipment Utilized During Treatment: Rolling walker (2 wheels);Gait belt  Activity Tolerance: Patient limited by pain Patient left: in bed;with call bell/phone within reach  OT Visit Diagnosis: Unsteadiness on feet (R26.81);Other abnormalities of gait and mobility (R26.89);Pain                Time: 8022-3361 OT  Time Calculation (min): 30 min Charges:  OT General Charges $OT Visit: 1 Visit OT Evaluation $OT Eval Moderate Complexity: 1 Mod  Nestor Lewandowsky, OTR/L Acute Rehabilitation Services Pager: [phone] Office: [phone]  Malka So 02/07/2021, 2:03 PM

## 2021-02-08 DIAGNOSIS — Z8719 Personal history of other diseases of the digestive system: Secondary | ICD-10-CM | POA: Diagnosis not present

## 2021-02-08 DIAGNOSIS — M19041 Primary osteoarthritis, right hand: Secondary | ICD-10-CM | POA: Diagnosis present

## 2021-02-08 DIAGNOSIS — G8929 Other chronic pain: Secondary | ICD-10-CM | POA: Diagnosis present

## 2021-02-08 DIAGNOSIS — K5900 Constipation, unspecified: Secondary | ICD-10-CM | POA: Diagnosis present

## 2021-02-08 DIAGNOSIS — R059 Cough, unspecified: Secondary | ICD-10-CM | POA: Diagnosis not present

## 2021-02-08 DIAGNOSIS — S32402A Unspecified fracture of left acetabulum, initial encounter for closed fracture: Secondary | ICD-10-CM | POA: Diagnosis present

## 2021-02-08 DIAGNOSIS — I10 Essential (primary) hypertension: Secondary | ICD-10-CM | POA: Diagnosis present

## 2021-02-08 DIAGNOSIS — I4819 Other persistent atrial fibrillation: Secondary | ICD-10-CM | POA: Diagnosis present

## 2021-02-08 DIAGNOSIS — M25551 Pain in right hip: Secondary | ICD-10-CM | POA: Diagnosis not present

## 2021-02-08 DIAGNOSIS — M4856XD Collapsed vertebra, not elsewhere classified, lumbar region, subsequent encounter for fracture with routine healing: Secondary | ICD-10-CM | POA: Diagnosis present

## 2021-02-08 DIAGNOSIS — M479 Spondylosis, unspecified: Secondary | ICD-10-CM | POA: Diagnosis present

## 2021-02-08 DIAGNOSIS — C7951 Secondary malignant neoplasm of bone: Secondary | ICD-10-CM | POA: Diagnosis not present

## 2021-02-08 DIAGNOSIS — Z888 Allergy status to other drugs, medicaments and biological substances status: Secondary | ICD-10-CM | POA: Diagnosis not present

## 2021-02-08 DIAGNOSIS — Z79899 Other long term (current) drug therapy: Secondary | ICD-10-CM | POA: Diagnosis not present

## 2021-02-08 DIAGNOSIS — Z9889 Other specified postprocedural states: Secondary | ICD-10-CM | POA: Diagnosis not present

## 2021-02-08 DIAGNOSIS — M8458XA Pathological fracture in neoplastic disease, other specified site, initial encounter for fracture: Secondary | ICD-10-CM | POA: Diagnosis not present

## 2021-02-08 DIAGNOSIS — R530 Neoplastic (malignant) related fatigue: Secondary | ICD-10-CM | POA: Diagnosis present

## 2021-02-08 DIAGNOSIS — I4821 Permanent atrial fibrillation: Secondary | ICD-10-CM | POA: Diagnosis present

## 2021-02-08 DIAGNOSIS — Z7901 Long term (current) use of anticoagulants: Secondary | ICD-10-CM | POA: Diagnosis not present

## 2021-02-08 DIAGNOSIS — M545 Low back pain, unspecified: Secondary | ICD-10-CM | POA: Diagnosis not present

## 2021-02-08 DIAGNOSIS — F40240 Claustrophobia: Secondary | ICD-10-CM | POA: Diagnosis present

## 2021-02-08 DIAGNOSIS — Z885 Allergy status to narcotic agent status: Secondary | ICD-10-CM | POA: Diagnosis not present

## 2021-02-08 DIAGNOSIS — S32059A Unspecified fracture of fifth lumbar vertebra, initial encounter for closed fracture: Secondary | ICD-10-CM | POA: Diagnosis not present

## 2021-02-08 DIAGNOSIS — S32401A Unspecified fracture of right acetabulum, initial encounter for closed fracture: Secondary | ICD-10-CM

## 2021-02-08 DIAGNOSIS — Z8546 Personal history of malignant neoplasm of prostate: Secondary | ICD-10-CM | POA: Diagnosis not present

## 2021-02-08 DIAGNOSIS — S32050A Wedge compression fracture of fifth lumbar vertebra, initial encounter for closed fracture: Secondary | ICD-10-CM

## 2021-02-08 DIAGNOSIS — E7800 Pure hypercholesterolemia, unspecified: Secondary | ICD-10-CM | POA: Diagnosis present

## 2021-02-08 DIAGNOSIS — Z823 Family history of stroke: Secondary | ICD-10-CM | POA: Diagnosis not present

## 2021-02-08 DIAGNOSIS — Z800 Family history of malignant neoplasm of digestive organs: Secondary | ICD-10-CM | POA: Diagnosis not present

## 2021-02-08 DIAGNOSIS — D649 Anemia, unspecified: Secondary | ICD-10-CM | POA: Diagnosis present

## 2021-02-08 DIAGNOSIS — M4316 Spondylolisthesis, lumbar region: Secondary | ICD-10-CM | POA: Diagnosis present

## 2021-02-08 DIAGNOSIS — Z87891 Personal history of nicotine dependence: Secondary | ICD-10-CM | POA: Diagnosis not present

## 2021-02-08 DIAGNOSIS — M4856XA Collapsed vertebra, not elsewhere classified, lumbar region, initial encounter for fracture: Secondary | ICD-10-CM | POA: Diagnosis present

## 2021-02-08 DIAGNOSIS — Z8249 Family history of ischemic heart disease and other diseases of the circulatory system: Secondary | ICD-10-CM | POA: Diagnosis not present

## 2021-02-08 DIAGNOSIS — Z950 Presence of cardiac pacemaker: Secondary | ICD-10-CM | POA: Diagnosis not present

## 2021-02-08 DIAGNOSIS — Z923 Personal history of irradiation: Secondary | ICD-10-CM | POA: Diagnosis not present

## 2021-02-08 DIAGNOSIS — K219 Gastro-esophageal reflux disease without esophagitis: Secondary | ICD-10-CM | POA: Diagnosis present

## 2021-02-08 DIAGNOSIS — J8410 Pulmonary fibrosis, unspecified: Secondary | ICD-10-CM | POA: Diagnosis present

## 2021-02-08 DIAGNOSIS — E785 Hyperlipidemia, unspecified: Secondary | ICD-10-CM | POA: Diagnosis present

## 2021-02-08 DIAGNOSIS — E559 Vitamin D deficiency, unspecified: Secondary | ICD-10-CM | POA: Diagnosis not present

## 2021-02-08 DIAGNOSIS — D72829 Elevated white blood cell count, unspecified: Secondary | ICD-10-CM | POA: Diagnosis present

## 2021-02-08 DIAGNOSIS — Z20822 Contact with and (suspected) exposure to covid-19: Secondary | ICD-10-CM | POA: Diagnosis present

## 2021-02-08 DIAGNOSIS — S32050G Wedge compression fracture of fifth lumbar vertebra, subsequent encounter for fracture with delayed healing: Secondary | ICD-10-CM | POA: Diagnosis not present

## 2021-02-08 DIAGNOSIS — M19042 Primary osteoarthritis, left hand: Secondary | ICD-10-CM | POA: Diagnosis present

## 2021-02-08 DIAGNOSIS — E871 Hypo-osmolality and hyponatremia: Secondary | ICD-10-CM | POA: Diagnosis not present

## 2021-02-08 LAB — HEPATIC FUNCTION PANEL
ALT: 22 U/L (ref 0–44)
AST: 24 U/L (ref 15–41)
Albumin: 2.9 g/dL — ABNORMAL LOW (ref 3.5–5.0)
Alkaline Phosphatase: 76 U/L (ref 38–126)
Bilirubin, Direct: 0.2 mg/dL (ref 0.0–0.2)
Indirect Bilirubin: 0.7 mg/dL (ref 0.3–0.9)
Total Bilirubin: 0.9 mg/dL (ref 0.3–1.2)
Total Protein: 5.5 g/dL — ABNORMAL LOW (ref 6.5–8.1)

## 2021-02-08 LAB — CBC
HCT: 29.9 % — ABNORMAL LOW (ref 39.0–52.0)
Hemoglobin: 9.7 g/dL — ABNORMAL LOW (ref 13.0–17.0)
MCH: 30.7 pg (ref 26.0–34.0)
MCHC: 32.4 g/dL (ref 30.0–36.0)
MCV: 94.6 fL (ref 80.0–100.0)
Platelets: 191 10*3/uL (ref 150–400)
RBC: 3.16 MIL/uL — ABNORMAL LOW (ref 4.22–5.81)
RDW: 14.2 % (ref 11.5–15.5)
WBC: 7.6 10*3/uL (ref 4.0–10.5)
nRBC: 0 % (ref 0.0–0.2)

## 2021-02-08 LAB — RETICULOCYTES
Immature Retic Fract: 20.1 % — ABNORMAL HIGH (ref 2.3–15.9)
RBC.: 3.17 MIL/uL — ABNORMAL LOW (ref 4.22–5.81)
Retic Count, Absolute: 59 10*3/uL (ref 19.0–186.0)
Retic Ct Pct: 1.9 % (ref 0.4–3.1)

## 2021-02-08 LAB — FERRITIN: Ferritin: 129 ng/mL (ref 24–336)

## 2021-02-08 LAB — VITAMIN D 25 HYDROXY (VIT D DEFICIENCY, FRACTURES): Vit D, 25-Hydroxy: 39.38 ng/mL (ref 30–100)

## 2021-02-08 MED ORDER — ACETAMINOPHEN 325 MG PO TABS
650.0000 mg | ORAL_TABLET | Freq: Four times a day (QID) | ORAL | Status: DC
  Administered 2021-02-08 – 2021-02-10 (×9): 650 mg via ORAL
  Filled 2021-02-08 (×10): qty 2

## 2021-02-08 MED ORDER — OXYCODONE HCL 5 MG PO TABS
10.0000 mg | ORAL_TABLET | ORAL | Status: DC
  Administered 2021-02-08 – 2021-02-12 (×27): 10 mg via ORAL
  Filled 2021-02-08 (×27): qty 2

## 2021-02-08 NOTE — Progress Notes (Signed)
Mobility Specialist Progress Note   02/08/21 1700  Mobility  Activity Sat and stood x 3;Stood at bedside  Level of Assistance Contact guard assist, steadying assist  Assistive Device Front wheel walker  Mobility Out of bed to chair with meals  Mobility Response Tolerated well  Mobility performed by Mobility specialist  $Mobility charge 1 Mobility    Pt able to perform STS independently but relies on max UE support when standing. While upright, pt practiced BLE exercises at bed side w/ x2 seated breaks. Returned back to EOB with call bell by side and all needs met.   Holland Falling Mobility Specialist Phone Number [phone]

## 2021-02-08 NOTE — Care Management Obs Status (Signed)
[place] NOTIFICATION   Patient Details  Name: Todd Chapman MRN: [id number] Date of Birth: August 19, 1945   Medicare Observation Status Notification Given:  Yes    Bartholomew Crews, RN 02/08/2021, 7:58 AM

## 2021-02-08 NOTE — Progress Notes (Signed)
Subjective: The patient is alert and pleasant.  He wants to proceed with a kyphoplasty as scheduled on Monday.  Objective: Vital signs in last 24 hours: Temp:  [97.8 F (36.6 C)-98.7 F (37.1 C)] 98.3 F (36.8 C) (12/03 0536) Pulse Rate:  [63-86] 86 (12/03 0536) Resp:  [18-20] 18 (12/03 0536) BP: (121-162)/(75-97) 162/92 (12/03 0536) SpO2:  [83 %-100 %] 83 % (12/03 0536) Estimated body mass index is 26.06 kg/m as calculated from the following:   Height as of this encounter: 6\' 2"  (1.88 m).   Weight as of this encounter: 92.1 kg.   Intake/Output from previous day: 12/02 0701 - 12/03 0700 In: 1000 [P.O.:1000] Out: 950 [Urine:950] Intake/Output this shift: No intake/output data recorded.  Physical exam the patient is alert and pleasant.  He is moving his lower extremities well.  Lab Results: Recent Labs    02/07/21 0539 02/08/21 0331  WBC 10.2 7.6  HGB 10.4* 9.7*  HCT 31.9* 29.9*  PLT 187 191   BMET Recent Labs    02/06/21 1228  NA 137  K 3.6  CL 103  CO2 25  GLUCOSE 108*  BUN 15  CREATININE 0.77  CALCIUM 10.1    Studies/Results: MR Lumbar Spine W Wo Contrast  Result Date: 02/07/2021 CLINICAL DATA:  Low back pain, infection suspected; compression fracture on recent CT, history of prostate cancer EXAM: MRI LUMBAR SPINE WITHOUT AND WITH CONTRAST TECHNIQUE: Multiplanar and multiecho pulse sequences of the lumbar spine were obtained without and with intravenous contrast. CONTRAST:  73mL GADAVIST GADOBUTROL 1 MMOL/ML IV SOLN COMPARISON:  Correlation made with CT 02/06/2021 FINDINGS: Motion artifact is present. Segmentation: Standard. Alignment:  Grade 1 anterolisthesis at L4-L5. Vertebrae: L5 inferior endplate compression fracture with less than 50% loss of height. Marrow edema and enhancement are present. Minimal adjacent ventral epidural enhancement. There is mild marrow edema associated with L5-S1 facet arthropathy. Degenerative endplate irregularity at L4-L5.  Schmorl's node at superior L1 endplate with adjacent STIR hyperintensity and enhancement. Conus medullaris and cauda equina: Conus extends to the L1 level. Conus and cauda equina appear normal. Paraspinal and other soft tissues: Unremarkable. No significant paraspinal edema or mass. Disc levels: L1-L2:  No stenosis. L2-L3: Minimal disc bulge with endplate osteophytic ridging. Mild facet arthropathy. No stenosis. L3-L4: Minimal disc bulge with endplate osteophytic ridging. Mild facet arthropathy. No stenosis. L4-L5: Anterolisthesis with uncovering of disc. Marked facet arthropathy with ligamentum flavum infolding and joint effusions. Small extra-spinal synovial cyst formation. Mild canal stenosis. Moderate right and minor left foraminal stenosis. L5-S1: Moderate facet arthropathy with ligamentum flavum infolding and joint effusions. Small extra-spinal synovial cyst formation. No stenosis. IMPRESSION: Acute to subacute L5 inferior endplate compression fracture with less than 50% loss of height and no significant retropulsion. No definite evidence of underlying lesion. Given prostate cancer history, a follow-up study is recommended. Abnormal signal and enhancement adjacent to L1 Schmorl's node is likely degenerative but can also be reevaluated at the time of follow-up. Facet predominant degenerative changes as detailed above. Electronically Signed   By: Macy Mis M.D.   On: 02/07/2021 15:50   MR PELVIS W WO CONTRAST  Result Date: 02/07/2021 CLINICAL DATA:  Low back and pelvic pain. History of prostate cancer EXAM: MRI PELVIS WITHOUT CONTRAST TECHNIQUE: Multiplanar multisequence MR imaging of the pelvis was performed. No intravenous contrast was administered. Multiplanar multisequence MR imaging of the pelvis was performed both before and after administration of intravenous contrast. Compatible pacemaker noted, manufacturer guidelines followed for scanning. Please note  that today's exam was performed using  routine MRI musculoskeletal pelvis protocol, and a dedicated prostate MRI was not performed. COMPARISON:  CT scan 02/06/2021 FINDINGS: Bones:Inferior endplate compression fracture at L5 is observed for example on image 35 series 17, please see report from dedicated lumbar spine MRI. Bilateral acetabular stress fractures primarily involving the acetabular roof but also the acetabular walls as shown on images 10 through 15 of series 15. In addition, there is subtle focal edema in the left pubic bone and adjacent superior ramus from stress fracture or nondisplaced fracture. The prior hip MRI from 11/04/2020 there was a left acetabular stress fracture, currently there is also symmetric involvement of the right side. Given the morphology and appearance I am skeptical of underlying osseous metastatic disease. Articular cartilage and labrum Articular cartilage:  Degenerative chondral thinning in both hips. Labrum:  N/A Joint or bursal effusion Joint effusion: Absent Bursae: Fluid tracks along the deep surface of the iliacus muscles bilaterally, a nonspecific finding which can be associated with other regional pelvic abnormalities. Muscles and tendons Minimal accentuated T2 signal anteriorly in the gluteus minimus and medius muscles in a symmetric manner, without associated significant abnormal enhancement, likely incidental. There is low-grade edema in the hip adductor musculature for example on image 19 series 15 in a symmetric manner. Other findings Minimal presacral edema. No prostatomegaly. Sigmoid colon diverticulosis. Despite efforts by the technologist and patient, motion artifact is present on today's exam and could not be eliminated. This reduces exam sensitivity and specificity. IMPRESSION: 1. Bilateral acetabular/supra-acetabular stress fractures in the pelvis. There is likewise some stress fracture involving the left medial superior pubic ramus. This overall represents a progression compared to the previous  unilateral left acetabular stress fracture shown on 11/04/2020. 2. L5 compression fracture as reported on recent MRI. 3. There is some low-grade edema along pelvic musculature, most notably the hip adductor musculature and along the deep side of the iliacus muscles. Electronically Signed   By: Van Clines M.D.   On: 02/07/2021 16:17   CT ABDOMEN PELVIS W CONTRAST  Result Date: 02/06/2021 CLINICAL DATA:  Abdominal pain EXAM: CT ABDOMEN AND PELVIS WITH CONTRAST TECHNIQUE: Multidetector CT imaging of the abdomen and pelvis was performed using the standard protocol following bolus administration of intravenous contrast. CONTRAST:  148mL OMNIPAQUE IOHEXOL 300 MG/ML  SOLN COMPARISON:  06/01/2020 FINDINGS: Lower chest: Patchy areas of ground-glass and airspace attenuation identified within the right middle lobe and right lower which appears similar to 12/16/2020. Small scattered lung nodules are again noted imaged portions of the right lung. Hepatobiliary: No focal liver abnormality. Moderate gallbladder distension. No gallstones, gallbladder wall thickening or pericholecystic inflammation. No significant bile duct dilatation. Pancreas: Unremarkable. No pancreatic ductal dilatation or surrounding inflammatory changes. Spleen: Normal in size without focal abnormality. Adrenals/Urinary Tract: Normal adrenal glands. The kidneys are unremarkable. No mass, nephrolithiasis or hydronephrosis. Urinary bladder is unremarkable. Stomach/Bowel: Stomach appears normal. Status post appendectomy. No pathologic dilatation of the large or small bowel loops to suggest recurrent bowel obstruction. Distal colonic diverticula noted without signs of acute diverticulitis. Vascular/Lymphatic: Aortic atherosclerosis without aneurysm. No signs of abdominopelvic adenopathy. Reproductive: Fiducial markers identified within the prostate gland. Other: No free fluid or fluid collections. Musculoskeletal: The bones appear osteopenic.  Degenerative disc disease is identified at L4-5 and L5-S1. New inferior endplate compression deformity is noted involving the L5 vertebral body, image 119/6. IMPRESSION: 1. No acute findings identified within the abdomen or pelvis. No signs of recurrent bowel obstruction. 2. Ground-glass and  airspace densities are noted within the right middle lobe and right lower lobe with scattered small lung nodules. Findings are similar to 12/16/2020 and are favored to represent sequelae of atypical infectious process. 3. New inferior endplate compression deformity involving the L5 vertebral body. 4. Aortic Atherosclerosis (ICD10-I70.0). Electronically Signed   By: Kerby Moors M.D.   On: 02/06/2021 16:30   CT L-SPINE NO CHARGE  Result Date: 02/06/2021 CLINICAL DATA:  Low back pain EXAM: CT LUMBAR SPINE WITHOUT CONTRAST TECHNIQUE: Multidetector CT imaging of the lumbar spine was performed without intravenous contrast administration. Multiplanar CT image reconstructions were also generated. COMPARISON:  Correlation made with CT abdomen March 2022 FINDINGS: Segmentation: 5 lumbar type vertebrae. Alignment: Similar grade 1 anterolisthesis at L4-L5. Vertebrae: There is new loss of height at the inferior endplate of L5. Height loss is less than 50%. Chronic large Schmorl's node of the L1 superior endplate. Paraspinal and other soft tissues: Extra-spinal findings are better evaluated on concurrent dedicated imaging. Disc levels: Multilevel disc space narrowing. Vacuum disc phenomena at L4-L5 and L5-S1. Facet hypertrophy is greatest at L4-L5 and L5-S1. There is no high-grade canal narrowing identified. Mild foraminal narrowing at lower lumbar levels. IMPRESSION: Age-indeterminate compression fracture at the inferior endplate of L5 with less than 50% loss height (new since March 2022). Degenerative changes without high-grade stenosis. Lower lumbar facet degeneration. Electronically Signed   By: Macy Mis M.D.   On: 02/06/2021  16:26   DG Chest Port 1 View  Result Date: 02/06/2021 CLINICAL DATA:  Fever. EXAM: PORTABLE CHEST 1 VIEW COMPARISON:  August 13, 2020. FINDINGS: Stable cardiomediastinal silhouette. 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 Decreased right basilar opacity is noted suggesting improving atelectasis or infiltrate left lung is clear. Bony thorax is unremarkable. IMPRESSION: Improved right basilar opacity as described above. Electronically Signed   By: Marijo Conception M.D.   On: 02/06/2021 14:32    Assessment/Plan: L5 compression fracture, lumbago: I have answered all the patient's questions regarding kyphoplasty.  We will plan to proceed on Monday.  Bilateral acetabular stress fractures: Per Dr. Lyla Glassing.  LOS: 0 days     [name] 02/08/2021, 8:32 AM

## 2021-02-08 NOTE — Progress Notes (Signed)
Spoke with Dr. Kathaleen Bury in regards to this patient's care for his bilateral acetabular fractures. After reviewing information and imaging in chart, he advised outpatient follow up with Dr. Lyla Glassing.

## 2021-02-08 NOTE — Progress Notes (Signed)
Mobility Specialist Progress Note   02/08/21 1516  Mobility  Activity Ambulated to bathroom  Level of Assistance Minimal assist, patient does 75% or more (+2 for supervision)  Assistive Device Front wheel walker  Distance Ambulated (ft) 10 ft  Mobility Out of bed for toileting (out of chair for toileting*)  Mobility Response Tolerated well  Mobility performed by Mobility specialist;Nurse  $Mobility charge 1 Mobility   RN requesting assistance w/ pt mobility to BR. Pt needing min cues for hand placement when sitting on toilet. Left w/ pt knowing to notify RN when done.  Holland Falling Mobility Specialist Phone Number [phone]

## 2021-02-08 NOTE — TOC Initial Note (Signed)
Transition of Care [facility]) - Initial/Assessment Note    Patient Details  Name: Todd Chapman MRN: [id number] Date of Birth: 1946-01-15  [facility]) CM/SW Contact:    Bartholomew Crews, RN Phone Number: [phone] 02/08/2021, 8:06 AM  Clinical Narrative:                  Spoke with patient on his hospital room phone. PTA home with spouse. Has a walker at home. Participates in therapy at [facility]. Stated he is anticipating kyphoplasty surgery on Monday. Will follow for transition of care needs.   Expected Discharge Plan: [place]     Patient Goals and CMS Choice Patient states their goals for this hospitalization and ongoing recovery are:: return home after surger CMS Medicare.gov Compare Post Acute Care list provided to:: Patient Choice offered to / list presented to : Patient  Expected Discharge Plan and Services Expected Discharge Plan: [place]   Discharge Planning Services: CM Consult   Living arrangements for the past 2 months: Single Family Home                                      Prior Living Arrangements/Services Living arrangements for the past 2 months: Single Family Home Lives with:: Self, Spouse Patient language and need for interpreter reviewed:: Yes Do you feel safe going back to the place where you live?: Yes      Need for Family Participation in Patient Care: Yes (Comment) Care giver support system in place?: Yes (comment) Current home services: DME (walker) Criminal Activity/Legal Involvement Pertinent to Current Situation/Hospitalization: No - Comment as needed  Activities of Daily Living      Permission Sought/Granted                  Emotional Assessment Appearance:: Appears stated age Attitude/Demeanor/Rapport: Engaged Affect (typically observed): Accepting Orientation: : Oriented to Self, Oriented to Place, Oriented to  Time, Oriented to Situation Alcohol /  Substance Use: Not Applicable Psych Involvement: No (comment)  Admission diagnosis:  Back pain [M54.9] Patient Active Problem List   Diagnosis Date Noted   Back pain 02/06/2021   SBO (small bowel obstruction) ([place]) 06/01/2020   Pacemaker 05/13/2020   Malignant neoplasm of prostate ([place]) 01/09/2020   Annual physical exam 01/08/2020   Acute upper respiratory infection 01/08/2020   Asymptomatic varicose veins of lower extremity 01/08/2020   Bronchitis 01/08/2020   Cataract 01/08/2020   Constipation 01/08/2020   Diverticulosis of large intestine without perforation or abscess without bleeding 01/08/2020   Screening for gout 01/08/2020   False positive serological test for syphilis 01/08/2020   Gastro-esophageal reflux disease without esophagitis 01/08/2020   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   History of colonic polyps 01/08/2020   Impacted cerumen 01/08/2020   Insomnia 01/08/2020   Osteoarthritis 01/08/2020   Polypharmacy 01/08/2020   Pure hypercholesterolemia 01/08/2020   Rosacea 01/08/2020   Combined forms of age-related cataract of both eyes 07/13/2019   Elevated blood pressure reading 11/08/2018   Sick sinus syndrome ([place]) 02/16/2017   Persistent atrial fibrillation ([place]) 12/18/2016   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   Nonexudative age-related macular degeneration, bilateral, 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   Dermatochalasis of both upper eyelids 07/25/2014   Nuclear sclerosis of both eyes 07/25/2014   Bradycardia 05/29/2014   Premature atrial contractions 05/29/2014  Premature junctional contractions ([place]) 05/29/2014   Hyperlipidemia 05/29/2014   Encounter for general adult medical examination with abnormal findings 04/24/2014   Vitamin D deficiency 04/24/2014   PCP:  [name], MD Pharmacy:   [facility] [place], [place] - [address] AT [address] [place] [address] Phone: [phone] Fax:  [phone]     Social Determinants of Health (SDOH) Interventions    Readmission Risk Interventions No flowsheet data found.

## 2021-02-08 NOTE — Progress Notes (Signed)
Occupational Therapy Treatment Patient Details Name: Todd Chapman MRN: [id number] DOB: [birth year] Today's Date: 02/08/2021   History of present illness Pt is a 75 year old man admitted on 02/06/21 with worsening back pain. 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 MRI confirmed L5 fx and B acetabular stress fxs to be managed with outpatient orthopedist. PMH: afib on Eliquis, pacemaker, HLD, SBO, prostate ca s/p radiation.   OT comments  Pt much more comfortable with scheduled pain medications. Focus of session on educating in back precautions for comfort during bed mobility and ADL. Reinforced benefits of RW vs rollator at home and 3 in 1. Set up 3 in 1 over toilet and adjusted height on RW for nursing to ambulate with pt to bathroom. Pt to have kyphoplasty on Monday.    Recommendations for follow up therapy are one component of a multi-disciplinary discharge planning process, led by the attending physician.  Recommendations may be updated based on patient status, additional functional criteria and insurance authorization.    Follow Up Recommendations  Home health OT    Assistance Recommended at Discharge    Equipment Recommendations  BSC/3in1;Hospital bed    Recommendations for Other Services      Precautions / Restrictions Precautions Precautions: Fall;Back Precaution Booklet Issued: Yes (comment) Precaution Comments: provided written handout and instructed in back precautions for comfort       Mobility Bed Mobility Overal bed mobility: Modified Independent             General bed mobility comments: practiced log roll technique    Transfers Overall transfer level: Needs assistance Equipment used: Rolling walker (2 wheels) Transfers: Sit to/from Stand Sit to Stand: Min assist           General transfer comment: from elevated surface, cues for hand placement     Balance Overall balance assessment: Needs  assistance Sitting-balance support: No upper extremity supported;Feet supported Sitting balance-Leahy Scale: Good     Standing balance support: Bilateral upper extremity supported Standing balance-Leahy Scale: Poor Standing balance comment: Reliant on BUE support, less than upon discharge                           ADL either performed or assessed with clinical judgement   ADL Overall ADL's : Needs assistance/impaired     Grooming: Set up;Sitting         Lower Body Bathing Details (indicate cue type and reason): educated in LB bathing leaning side to side in sitting                     Functional mobility during ADLs: Minimal assistance;Cueing for safety;Rolling walker (2 wheels)      Extremity/Trunk Assessment              Vision       Perception     Praxis      Cognition Arousal/Alertness: Awake/alert Behavior During Therapy: WFL for tasks assessed/performed Overall Cognitive Status: Within Functional Limits for tasks assessed                                            Exercises     Shoulder Instructions       General Comments      Pertinent Vitals/ Pain       Pain  Assessment: Faces Faces Pain Scale: Hurts little more Pain Location: back Pain Descriptors / Indicators: Grimacing;Guarding Pain Intervention(s): Monitored during session;Premedicated before session  Home Living                                          Prior Functioning/Environment              Frequency  Min 2X/week        Progress Toward Goals  OT Goals(current goals can now be found in the care plan section)  Progress towards OT goals: Progressing toward goals  Acute Rehab OT Goals OT Goal Formulation: With patient Time For Goal Achievement: 02/21/21 Potential to Achieve Goals: Good  Plan Discharge plan remains appropriate    Co-evaluation                 AM-PAC OT "6 Clicks" Daily Activity      Outcome Measure   Help from another person eating meals?: None Help from another person taking care of personal grooming?: A Little Help from another person toileting, which includes using toliet, bedpan, or urinal?: A Little Help from another person bathing (including washing, rinsing, drying)?: A Little Help from another person to put on and taking off regular upper body clothing?: A Little Help from another person to put on and taking off regular lower body clothing?: A Little 6 Click Score: 19    End of Session Equipment Utilized During Treatment: Rolling walker (2 wheels);Gait belt  OT Visit Diagnosis: Unsteadiness on feet (R26.81);Other abnormalities of gait and mobility (R26.89);Pain   Activity Tolerance Patient tolerated treatment well   Patient Left in bed;with call bell/phone within reach;with nursing/sitter in room   Nurse Communication Mobility status        Time: 1200-1220 OT Time Calculation (min): 20 min  Charges: OT General Charges $OT Visit: 1 Visit OT Treatments $Self Care/Home Management : 8-22 mins  Nestor Lewandowsky, OTR/L Acute Rehabilitation Services Pager: [phone] Office: [phone]   Malka So 02/08/2021, 1:38 PM

## 2021-02-09 DIAGNOSIS — S32050A Wedge compression fracture of fifth lumbar vertebra, initial encounter for closed fracture: Secondary | ICD-10-CM | POA: Diagnosis not present

## 2021-02-09 LAB — CBC
HCT: 37.6 % — ABNORMAL LOW (ref 39.0–52.0)
Hemoglobin: 12.1 g/dL — ABNORMAL LOW (ref 13.0–17.0)
MCH: 30.7 pg (ref 26.0–34.0)
MCHC: 32.2 g/dL (ref 30.0–36.0)
MCV: 95.4 fL (ref 80.0–100.0)
Platelets: 244 10*3/uL (ref 150–400)
RBC: 3.94 MIL/uL — ABNORMAL LOW (ref 4.22–5.81)
RDW: 13.8 % (ref 11.5–15.5)
WBC: 5.8 10*3/uL (ref 4.0–10.5)
nRBC: 0 % (ref 0.0–0.2)

## 2021-02-09 LAB — SURGICAL PCR SCREEN
MRSA, PCR: NEGATIVE
Staphylococcus aureus: NEGATIVE

## 2021-02-09 NOTE — Progress Notes (Signed)
Family Medicine Teaching Service Daily Progress Note Intern Pager: [phone]  Patient name: Todd Chapman Medical record number: [id number] Date of birth: February 14, 1946 Age: 75 y.o. Gender: male  Primary Care Provider: [name], MD Consultants: Neurosurgery Code Status: Full code  Pt Overview and Major Events to Date:  12/1: Admitted for L5 compression fracture 12/2: Neurosurgery consulted, MR showing the L5 compression fracture as well as bilateral acetabular stress fractures 12/3: Ortho consulted and will plan for follow-up outpatient  Assessment and Plan:  Todd Chapman is a 75 y.o.male who presented with worsening back pain.  Past medical history of A. fib on Eliquis status post pacemaker placement, prostate cancer status postradiation, HLD, GERD.  L5 compression fracture  bilateral acetabular stress fractures Back pain well controlled today.  Neurosurgery planning on kyphoplasty on 12/5. - Neuro surgery consulted, appreciate recommendations and assistance in caring for this patient - Orthopedics consulted and spoke with Dr. Kathaleen Bury who reviewed the information chart and advised outpatient follow-up with Dr. Lyla Glassing -Continue oxycodone IR 10 mg every 4 as needed - Continue calcitonin 1 spray alternating nares daily - Continue Tylenol 650 mg every 6 hours  Constipation Patient reports chronic history of constipation which is worsened by Todd Chapman.  Currently on MiraLAX and Senokot.  We will continue bowel regiment and consider additional treatments if no bowel movement in the next 24 hours.  All other chronic, stable medical conditions medically managed with home medications as appropriate  FEN/GI: Heart healthy PPx: SCDs Dispo:Home with home health  pending clinical improvement . Barriers include waiting for procedure per neurosurgery.   Subjective:  Patient reports that his pain has been well controlled today.  He reports that he has not had a bowel movement since  Thursday but feels like he has to have a bowel movement today.  He has been taking MiraLAX and senna.  Denies any new symptoms or weaknesses.  Objective: Temp:  [98.2 F (36.8 C)-98.9 F (37.2 C)] 98.2 F (36.8 C) (12/04 0741) Pulse Rate:  [63-73] 73 (12/04 0741) Resp:  [17-19] 17 (12/04 0741) BP: (123-151)/(76-96) 139/93 (12/04 0741) SpO2:  [96 %-100 %] 100 % (12/04 0741) Physical Exam: General: Well-appearing, sitting on the edge of bed eating breakfast Cardiovascular: Regular rate and rhythm, no murmurs appreciated Respiratory: Normal work of breathing, lungs clear to auscultation Abdomen: Soft, nontender, positive bowel sounds Extremities: No gross abnormalities MSK: Patient with tenderness to palpation around L5 as well as both SI joints.  Limited rotation of the lumbar spine. Laboratory: Recent Labs  Lab 02/06/21 1228 02/07/21 0539 02/08/21 0331  WBC 15.0* 10.2 7.6  HGB 12.1* 10.4* 9.7*  HCT 36.5* 31.9* 29.9*  PLT 231 187 191   Recent Labs  Lab 02/06/21 1228 02/08/21 0331  NA 137  --   K 3.6  --   CL 103  --   CO2 25  --   BUN 15  --   CREATININE 0.77  --   CALCIUM 10.1  --   PROT 6.5 5.5*  BILITOT 1.5* 0.9  ALKPHOS 91 76  ALT 24 22  AST 26 24  GLUCOSE 108*  --      Imaging/Diagnostic Tests: MR Lumbar Spine W Wo Contrast  Result Date: 02/07/2021 CLINICAL DATA:  Low back pain, infection suspected; compression fracture on recent CT, history of prostate cancer EXAM: MRI LUMBAR SPINE WITHOUT AND WITH CONTRAST TECHNIQUE: Multiplanar and multiecho pulse sequences of the lumbar spine were obtained without and with intravenous contrast. CONTRAST:  68mL  GADAVIST GADOBUTROL 1 MMOL/ML IV SOLN COMPARISON:  Correlation made with CT 02/06/2021 FINDINGS: Motion artifact is present. Segmentation: Standard. Alignment:  Grade 1 anterolisthesis at L4-L5. Vertebrae: L5 inferior endplate compression fracture with less than 50% loss of height. Marrow edema and enhancement are  present. Minimal adjacent ventral epidural enhancement. There is mild marrow edema associated with L5-S1 facet arthropathy. Degenerative endplate irregularity at L4-L5. Schmorl's node at superior L1 endplate with adjacent STIR hyperintensity and enhancement. Conus medullaris and cauda equina: Conus extends to the L1 level. Conus and cauda equina appear normal. Paraspinal and other soft tissues: Unremarkable. No significant paraspinal edema or mass. Disc levels: L1-L2:  No stenosis. L2-L3: Minimal disc bulge with endplate osteophytic ridging. Mild facet arthropathy. No stenosis. L3-L4: Minimal disc bulge with endplate osteophytic ridging. Mild facet arthropathy. No stenosis. L4-L5: Anterolisthesis with uncovering of disc. Marked facet arthropathy with ligamentum flavum infolding and joint effusions. Small extra-spinal synovial cyst formation. Mild canal stenosis. Moderate right and minor left foraminal stenosis. L5-S1: Moderate facet arthropathy with ligamentum flavum infolding and joint effusions. Small extra-spinal synovial cyst formation. No stenosis. IMPRESSION: Acute to subacute L5 inferior endplate compression fracture with less than 50% loss of height and no significant retropulsion. No definite evidence of underlying lesion. Given prostate cancer history, a follow-up study is recommended. Abnormal signal and enhancement adjacent to L1 Schmorl's node is likely degenerative but can also be reevaluated at the time of follow-up. Facet predominant degenerative changes as detailed above. Electronically Signed   By: Macy Mis M.D.   On: 02/07/2021 15:50   MR PELVIS W WO CONTRAST  Result Date: 02/07/2021 CLINICAL DATA:  Low back and pelvic pain. History of prostate cancer EXAM: MRI PELVIS WITHOUT CONTRAST TECHNIQUE: Multiplanar multisequence MR imaging of the pelvis was performed. No intravenous contrast was administered. Multiplanar multisequence MR imaging of the pelvis was performed both before and after  administration of intravenous contrast. Compatible pacemaker noted, manufacturer guidelines followed for scanning. Please note that today's exam was performed using routine MRI musculoskeletal pelvis protocol, and a dedicated prostate MRI was not performed. COMPARISON:  CT scan 02/06/2021 FINDINGS: Bones:Inferior endplate compression fracture at L5 is observed for example on image 35 series 17, please see report from dedicated lumbar spine MRI. Bilateral acetabular stress fractures primarily involving the acetabular roof but also the acetabular walls as shown on images 10 through 15 of series 15. In addition, there is subtle focal edema in the left pubic bone and adjacent superior ramus from stress fracture or nondisplaced fracture. The prior hip MRI from 11/04/2020 there was a left acetabular stress fracture, currently there is also symmetric involvement of the right side. Given the morphology and appearance I am skeptical of underlying osseous metastatic disease. Articular cartilage and labrum Articular cartilage:  Degenerative chondral thinning in both hips. Labrum:  N/A Joint or bursal effusion Joint effusion: Absent Bursae: Fluid tracks along the deep surface of the iliacus muscles bilaterally, a nonspecific finding which can be associated with other regional pelvic abnormalities. Muscles and tendons Minimal accentuated T2 signal anteriorly in the gluteus minimus and medius muscles in a symmetric manner, without associated significant abnormal enhancement, likely incidental. There is low-grade edema in the hip adductor musculature for example on image 19 series 15 in a symmetric manner. Other findings Minimal presacral edema. No prostatomegaly. Sigmoid colon diverticulosis. Despite efforts by the technologist and patient, motion artifact is present on today's exam and could not be eliminated. This reduces exam sensitivity and specificity. IMPRESSION: 1. Bilateral acetabular/supra-acetabular  stress fractures in  the pelvis. There is likewise some stress fracture involving the left medial superior pubic ramus. This overall represents a progression compared to the previous unilateral left acetabular stress fracture shown on 11/04/2020. 2. L5 compression fracture as reported on recent MRI. 3. There is some low-grade edema along pelvic musculature, most notably the hip adductor musculature and along the deep side of the iliacus muscles. Electronically Signed   By: Van Clines M.D.   On: 02/07/2021 16:17     Gifford Shave, MD 02/09/2021, 11:04 AM PGY-3, [facility] Intern pager: [phone], text pages welcome

## 2021-02-09 NOTE — Progress Notes (Signed)
FPTS Interim Night Progress Note  S:Patient sleeping comfortably.  Rounded with primary night RN.  No concerns voiced. Pain well tolerated with current medications. No orders required.    O: Today's Vitals   02/08/21 2039 02/08/21 2044 02/08/21 2140 02/09/21 0349  BP:   123/76 (!) 145/85  Pulse:   71 63  Resp:   19 18  Temp:  98.3 F (36.8 C) 98.9 F (37.2 C) 98.4 F (36.9 C)  TempSrc:  Oral Oral Oral  SpO2:  96% 99% 98%  Weight:      Height:      PainSc: 7          A/P: Continue current management  Carollee Leitz MD PGY-3, [facility] Service pager [phone]

## 2021-02-09 NOTE — Plan of Care (Signed)
°  Problem: Education: °Goal: Knowledge of General Education information will improve °Description: Including pain rating scale, medication(s)/side effects and non-pharmacologic comfort measures °Outcome: Progressing °  °Problem: Clinical Measurements: °Goal: Ability to maintain clinical measurements within normal limits will improve °Outcome: Progressing °  °Problem: Nutrition: °Goal: Adequate nutrition will be maintained °Outcome: Progressing °  °

## 2021-02-09 NOTE — Progress Notes (Signed)
FPTS Brief Progress Note  S:Todd Chapman is in good spirits this evening and reports adequate pain control ahead of his operation tomorrow. No acute concerns.    O: BP (!) 150/88 (BP Location: Left Arm)   Pulse 73   Temp 98.7 F (37.1 C) (Oral)   Resp 16   Ht 6\' 2"  (1.88 m)   Wt 92.1 kg   SpO2 98%   BMI 26.06 kg/m   Physical Exam Vitals reviewed.  Constitutional:      General: He is not in acute distress. Pulmonary:     Effort: Pulmonary effort is normal.  Neurological:     General: No focal deficit present.  Psychiatric:        Mood and Affect: Mood normal.        Thought Content: Thought content normal.     A/P: L5 compression fracture  bilateral acetabular stress fractures NPO At midnight for kyphoplasty tomorrow am. Pain controlled on current regimen. - Continue scheduled Tylenol and calcitonin, PRN oxycodone   Other management per day team note.  - Orders reviewed. Adjusted as needed.  - If condition changes, plan includes pain management until surgery tomorrow.   Eppie Gibson, MD 02/09/2021, 8:52 PM PGY-1, [facility] Night Resident  Please page [phone] with questions.

## 2021-02-09 NOTE — Progress Notes (Signed)
Mobility Specialist Progress Note   02/09/21 1110  Mobility  Activity Ambulated in room;Ambulated to bathroom;Stood at bedside  Level of Assistance Contact guard assist, steadying assist  Assistive Device Front wheel walker  Distance Ambulated (ft) 15 ft  Mobility Ambulated with assistance in room;Out of bed for toileting  Mobility Response Tolerated well  Mobility performed by Mobility specialist  $Mobility charge 1 Mobility   Received pt in BR requesting assistance back to bed. Mod I for transfer to EOB w/ little complaint of pain. While at bedside, pt able to participate in LE exercise w/ minA. Session was limited d/t pain and general weakness in back and LE. Returned back to EOB w/ RN and doctor in the room.   Holland Falling Mobility Specialist Phone Number [phone]

## 2021-02-10 ENCOUNTER — Inpatient Hospital Stay (HOSPITAL_COMMUNITY): Payer: Medicare Other

## 2021-02-10 ENCOUNTER — Inpatient Hospital Stay (HOSPITAL_COMMUNITY): Payer: Medicare Other | Admitting: Certified Registered Nurse Anesthetist

## 2021-02-10 ENCOUNTER — Encounter (HOSPITAL_COMMUNITY): Payer: Self-pay | Admitting: Student

## 2021-02-10 ENCOUNTER — Encounter (HOSPITAL_COMMUNITY)
Admission: EM | Disposition: A | Discharge status: Rehabilitation Facility | Payer: Self-pay | Source: Home / Self Care | Attending: Family Medicine

## 2021-02-10 DIAGNOSIS — S32050A Wedge compression fracture of fifth lumbar vertebra, initial encounter for closed fracture: Secondary | ICD-10-CM | POA: Diagnosis not present

## 2021-02-10 HISTORY — PX: KYPHOPLASTY: SHX5884

## 2021-02-10 LAB — CUP PACEART REMOTE DEVICE CHECK
Battery Remaining Longevity: 71 mo
Battery Remaining Percentage: 61 %
Battery Voltage: 3.01 V
Brady Statistic RV Percent Paced: 58 %
Date Time Interrogation Session: 20221202132736
Implantable Lead Implant Date: 20181211
Implantable Lead Implant Date: 20181211
Implantable Lead Location: 753859
Implantable Lead Location: 753860
Implantable Pulse Generator Implant Date: 20181211
Lead Channel Impedance Value: 350 Ohm
Lead Channel Pacing Threshold Amplitude: 0.5 V
Lead Channel Pacing Threshold Pulse Width: 0.5 ms
Lead Channel Sensing Intrinsic Amplitude: 9.1 mV
Lead Channel Setting Pacing Amplitude: 2.5 V
Lead Channel Setting Pacing Pulse Width: 0.5 ms
Lead Channel Setting Sensing Sensitivity: 2 mV
Pulse Gen Model: 2272
Pulse Gen Serial Number: 8967264

## 2021-02-10 LAB — BASIC METABOLIC PANEL WITH GFR
Anion gap: 6 (ref 5–15)
BUN: 12 mg/dL (ref 8–23)
CO2: 27 mmol/L (ref 22–32)
Calcium: 10.1 mg/dL (ref 8.9–10.3)
Chloride: 99 mmol/L (ref 98–111)
Creatinine, Ser: 0.78 mg/dL (ref 0.61–1.24)
GFR, Estimated: >60 mL/min
Glucose, Bld: 130 mg/dL — ABNORMAL HIGH (ref 70–99)
Potassium: 4.4 mmol/L (ref 3.5–5.1)
Sodium: 132 mmol/L — ABNORMAL LOW (ref 135–145)

## 2021-02-10 IMAGING — RF DG LUMBAR SPINE 2-3V
1 series · 1 of 1 positions shown · non-contrast
Comparison: MRI [DATE]

CLINICAL DATA: Kyphoplasty

EXAM:
LUMBAR SPINE - 2-3 VIEW

[Series 1: run · 1 of 1 slices shown]
[im 1/1]
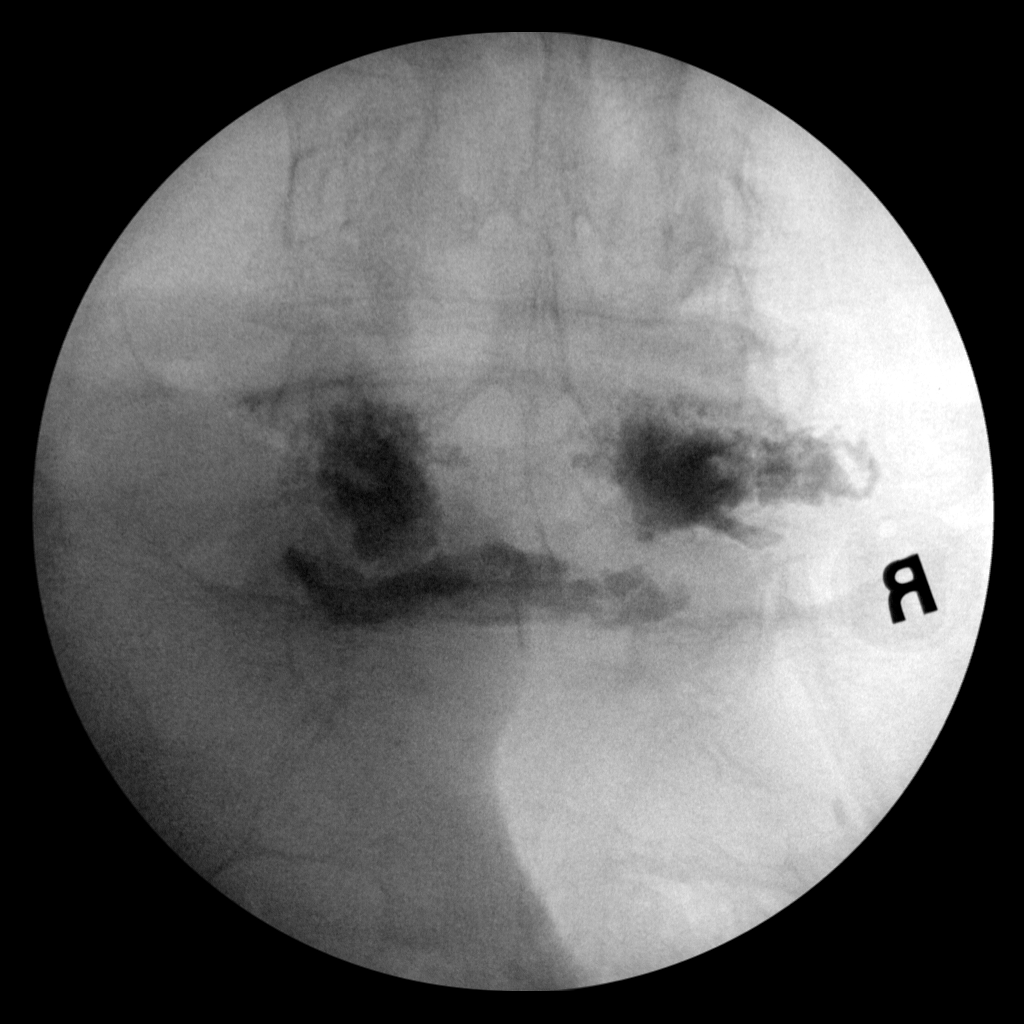

[1 of 1 positions shown; findings below may reference images not displayed]

FINDINGS: Single low resolution intraoperative spot view of the lumbar spine.
Total fluoroscopy time was 2 minutes 28 seconds. The image
demonstrates vertebral augmentation at L5.
IMPRESSION: Intraoperative fluoroscopic assistance provided during kyphoplasty

## 2021-02-10 IMAGING — RF DG LUMBAR SPINE 2-3V
1 series · 1 of 1 positions shown · non-contrast
Comparison: MRI [DATE]

CLINICAL DATA: Kyphoplasty

EXAM:
LUMBAR SPINE - 2-3 VIEW

[Series 1: run · 1 of 1 slices shown]
[im 1/1]
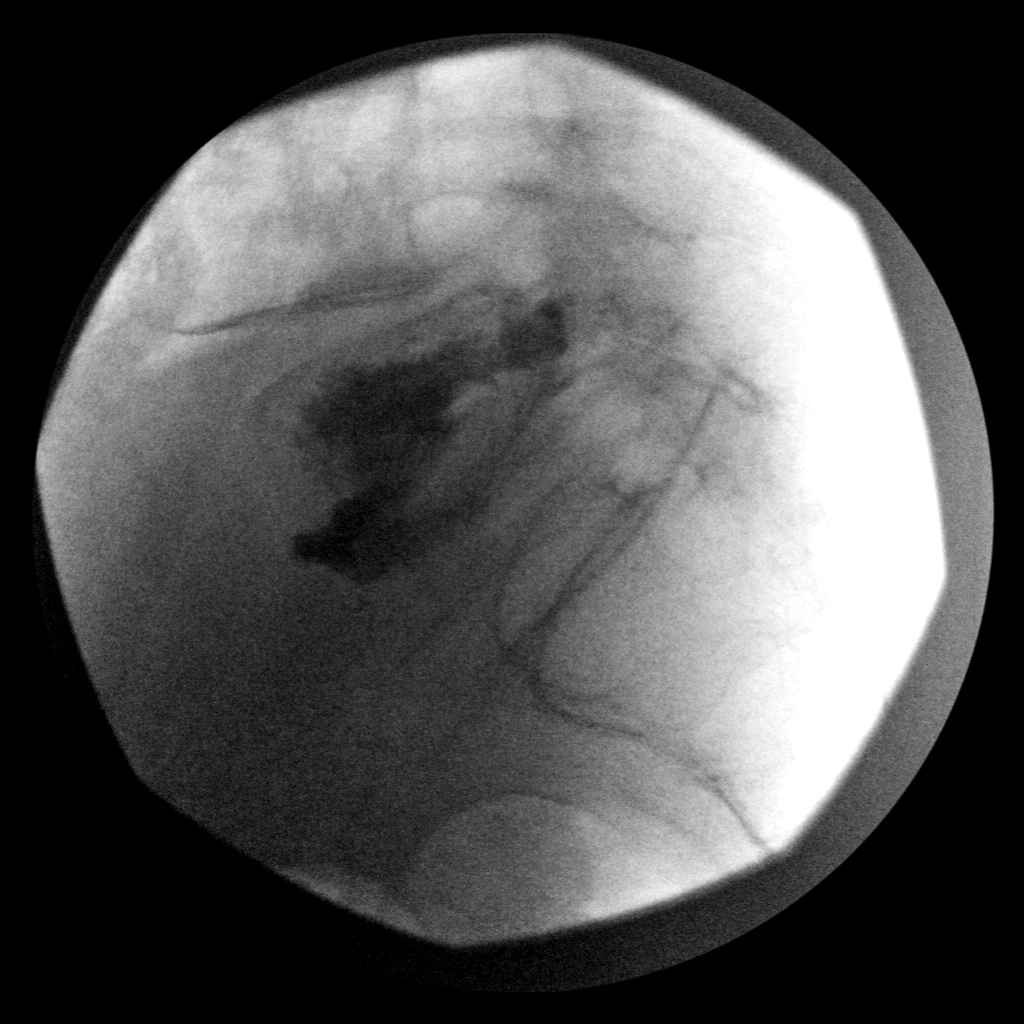

[1 of 1 positions shown; findings below may reference images not displayed]

FINDINGS: Single low resolution intraoperative spot view of the lumbar spine.
Total fluoroscopy time was 2 minutes 28 seconds. The image
demonstrates vertebral augmentation at L5.
IMPRESSION: Intraoperative fluoroscopic assistance provided during kyphoplasty

## 2021-02-10 SURGERY — KYPHOPLASTY
Anesthesia: General | Site: Back

## 2021-02-10 MED ORDER — CHLORHEXIDINE GLUCONATE 0.12 % MT SOLN
OROMUCOSAL | Status: AC
  Administered 2021-02-10: 15 mL via OROMUCOSAL
  Filled 2021-02-10: qty 15

## 2021-02-10 MED ORDER — BUPIVACAINE-EPINEPHRINE 0.5% -1:200000 IJ SOLN
INTRAMUSCULAR | Status: AC
  Filled 2021-02-10: qty 1

## 2021-02-10 MED ORDER — OXYCODONE HCL 5 MG PO TABS: 5.0000 mg | ORAL_TABLET | ORAL | Status: DC | PRN

## 2021-02-10 MED ORDER — ACETAMINOPHEN 500 MG PO TABS
1000.0000 mg | ORAL_TABLET | Freq: Four times a day (QID) | ORAL | Status: AC
  Administered 2021-02-10 – 2021-02-11 (×3): 1000 mg via ORAL
  Filled 2021-02-10 (×4): qty 2

## 2021-02-10 MED ORDER — IOPAMIDOL (ISOVUE-300) INJECTION 61%
INTRAVENOUS | Status: DC | PRN
  Administered 2021-02-10: 10 mL

## 2021-02-10 MED ORDER — FENTANYL CITRATE (PF) 250 MCG/5ML IJ SOLN
INTRAMUSCULAR | Status: DC | PRN
  Administered 2021-02-10 (×3): 50 ug via INTRAVENOUS

## 2021-02-10 MED ORDER — PHENYLEPHRINE 40 MCG/ML (10ML) SYRINGE FOR IV PUSH (FOR BLOOD PRESSURE SUPPORT)
PREFILLED_SYRINGE | INTRAVENOUS | Status: DC | PRN
  Administered 2021-02-10: 40 ug via INTRAVENOUS
  Administered 2021-02-10: 120 ug via INTRAVENOUS
  Administered 2021-02-10 (×2): 80 ug via INTRAVENOUS
  Administered 2021-02-10: 40 ug via INTRAVENOUS

## 2021-02-10 MED ORDER — 0.9 % SODIUM CHLORIDE (POUR BTL) OPTIME
TOPICAL | Status: DC | PRN
  Administered 2021-02-10: 1000 mL

## 2021-02-10 MED ORDER — LACTATED RINGERS IV SOLN: INTRAVENOUS | Status: DC

## 2021-02-10 MED ORDER — CEFAZOLIN SODIUM 1 G IJ SOLR
INTRAMUSCULAR | Status: AC
  Filled 2021-02-10: qty 20

## 2021-02-10 MED ORDER — OXYCODONE HCL 5 MG PO TABS: 5.0000 mg | ORAL_TABLET | Freq: Once | ORAL | Status: DC | PRN

## 2021-02-10 MED ORDER — ACETAMINOPHEN 500 MG PO TABS
ORAL_TABLET | ORAL | Status: AC
  Filled 2021-02-10: qty 2

## 2021-02-10 MED ORDER — BISACODYL 10 MG RE SUPP: 10.0000 mg | Freq: Every day | RECTAL | Status: DC | PRN

## 2021-02-10 MED ORDER — SUGAMMADEX SODIUM 200 MG/2ML IV SOLN
INTRAVENOUS | Status: DC | PRN
  Administered 2021-02-10: 200 mg via INTRAVENOUS

## 2021-02-10 MED ORDER — ROCURONIUM BROMIDE 10 MG/ML (PF) SYRINGE
PREFILLED_SYRINGE | INTRAVENOUS | Status: AC
  Filled 2021-02-10: qty 10

## 2021-02-10 MED ORDER — ORAL CARE MOUTH RINSE: 15.0000 mL | Freq: Once | OROMUCOSAL | Status: AC

## 2021-02-10 MED ORDER — SODIUM CHLORIDE 0.9% FLUSH: 3.0000 mL | INTRAVENOUS | Status: DC | PRN

## 2021-02-10 MED ORDER — GLYCOPYRROLATE PF 0.2 MG/ML IJ SOSY
PREFILLED_SYRINGE | INTRAMUSCULAR | Status: AC
  Filled 2021-02-10: qty 1

## 2021-02-10 MED ORDER — NEOSTIGMINE METHYLSULFATE 3 MG/3ML IV SOSY
PREFILLED_SYRINGE | INTRAVENOUS | Status: AC
  Filled 2021-02-10: qty 3

## 2021-02-10 MED ORDER — PROPOFOL 10 MG/ML IV BOLUS
INTRAVENOUS | Status: AC
  Filled 2021-02-10: qty 20

## 2021-02-10 MED ORDER — CYCLOBENZAPRINE HCL 10 MG PO TABS
10.0000 mg | ORAL_TABLET | Freq: Three times a day (TID) | ORAL | Status: DC | PRN
  Administered 2021-02-11: 10 mg via ORAL
  Filled 2021-02-10: qty 1

## 2021-02-10 MED ORDER — CHLORHEXIDINE GLUCONATE 0.12 % MT SOLN: 15.0000 mL | Freq: Once | OROMUCOSAL | Status: AC

## 2021-02-10 MED ORDER — LIDOCAINE 2% (20 MG/ML) 5 ML SYRINGE
INTRAMUSCULAR | Status: AC
  Filled 2021-02-10: qty 5

## 2021-02-10 MED ORDER — FENTANYL CITRATE (PF) 100 MCG/2ML IJ SOLN
INTRAMUSCULAR | Status: AC
  Filled 2021-02-10: qty 2

## 2021-02-10 MED ORDER — BACITRACIN ZINC 500 UNIT/GM EX OINT
TOPICAL_OINTMENT | CUTANEOUS | Status: AC
  Filled 2021-02-10: qty 28.35

## 2021-02-10 MED ORDER — OXYCODONE HCL 5 MG/5ML PO SOLN: 5.0000 mg | Freq: Once | ORAL | Status: DC | PRN

## 2021-02-10 MED ORDER — PHENOL 1.4 % MT LIQD: 1.0000 | OROMUCOSAL | Status: DC | PRN

## 2021-02-10 MED ORDER — CEFAZOLIN SODIUM-DEXTROSE 2-4 GM/100ML-% IV SOLN
2.0000 g | Freq: Three times a day (TID) | INTRAVENOUS | Status: AC
  Administered 2021-02-10 – 2021-02-11 (×2): 2 g via INTRAVENOUS
  Filled 2021-02-10 (×3): qty 100

## 2021-02-10 MED ORDER — BUPIVACAINE-EPINEPHRINE 0.5% -1:200000 IJ SOLN
INTRAMUSCULAR | Status: DC | PRN
  Administered 2021-02-10: 10 mL

## 2021-02-10 MED ORDER — ONDANSETRON HCL 4 MG/2ML IJ SOLN: 4.0000 mg | Freq: Once | INTRAMUSCULAR | Status: DC | PRN

## 2021-02-10 MED ORDER — FENTANYL CITRATE (PF) 100 MCG/2ML IJ SOLN
25.0000 ug | INTRAMUSCULAR | Status: DC | PRN
  Administered 2021-02-10: 50 ug via INTRAVENOUS

## 2021-02-10 MED ORDER — ACETAMINOPHEN 325 MG PO TABS
650.0000 mg | ORAL_TABLET | Freq: Four times a day (QID) | ORAL | Status: DC
  Administered 2021-02-12 (×2): 650 mg via ORAL
  Filled 2021-02-10 (×3): qty 2

## 2021-02-10 MED ORDER — DOCUSATE SODIUM 100 MG PO CAPS
100.0000 mg | ORAL_CAPSULE | Freq: Two times a day (BID) | ORAL | Status: DC
  Administered 2021-02-10: 100 mg via ORAL
  Filled 2021-02-10: qty 1

## 2021-02-10 MED ORDER — SODIUM CHLORIDE 0.9% FLUSH
3.0000 mL | Freq: Two times a day (BID) | INTRAVENOUS | Status: DC
  Administered 2021-02-11 (×2): 3 mL via INTRAVENOUS

## 2021-02-10 MED ORDER — SODIUM CHLORIDE 0.9 % IV SOLN: 250.0000 mL | INTRAVENOUS | Status: DC

## 2021-02-10 MED ORDER — ONDANSETRON HCL 4 MG PO TABS: 4.0000 mg | ORAL_TABLET | Freq: Four times a day (QID) | ORAL | Status: DC | PRN

## 2021-02-10 MED ORDER — PHENYLEPHRINE 40 MCG/ML (10ML) SYRINGE FOR IV PUSH (FOR BLOOD PRESSURE SUPPORT)
PREFILLED_SYRINGE | INTRAVENOUS | Status: AC
  Filled 2021-02-10: qty 10

## 2021-02-10 MED ORDER — MENTHOL 3 MG MT LOZG: 1.0000 | LOZENGE | OROMUCOSAL | Status: DC | PRN

## 2021-02-10 MED ORDER — ACETAMINOPHEN 500 MG PO TABS: 1000.0000 mg | ORAL_TABLET | Freq: Once | ORAL | Status: DC

## 2021-02-10 MED ORDER — PROPOFOL 10 MG/ML IV BOLUS
INTRAVENOUS | Status: DC | PRN
  Administered 2021-02-10: 130 mg via INTRAVENOUS

## 2021-02-10 MED ORDER — FENTANYL CITRATE (PF) 250 MCG/5ML IJ SOLN
INTRAMUSCULAR | Status: AC
  Filled 2021-02-10: qty 5

## 2021-02-10 MED ORDER — OXYCODONE HCL 5 MG PO TABS: 10.0000 mg | ORAL_TABLET | ORAL | Status: DC | PRN

## 2021-02-10 MED ORDER — MORPHINE SULFATE (PF) 4 MG/ML IV SOLN: 4.0000 mg | INTRAVENOUS | Status: DC | PRN

## 2021-02-10 MED ORDER — LIDOCAINE 2% (20 MG/ML) 5 ML SYRINGE
INTRAMUSCULAR | Status: DC | PRN
  Administered 2021-02-10: 60 mg via INTRAVENOUS

## 2021-02-10 MED ORDER — ONDANSETRON HCL 4 MG/2ML IJ SOLN: 4.0000 mg | Freq: Four times a day (QID) | INTRAMUSCULAR | Status: DC | PRN

## 2021-02-10 MED ORDER — CEFAZOLIN SODIUM-DEXTROSE 2-3 GM-%(50ML) IV SOLR
INTRAVENOUS | Status: DC | PRN
  Administered 2021-02-10: 2 g via INTRAVENOUS

## 2021-02-10 MED ORDER — ACETAMINOPHEN 650 MG RE SUPP: 650.0000 mg | RECTAL | Status: DC | PRN

## 2021-02-10 MED ORDER — BACITRACIN ZINC 500 UNIT/GM EX OINT
TOPICAL_OINTMENT | CUTANEOUS | Status: DC | PRN
  Administered 2021-02-10: 1 via TOPICAL

## 2021-02-10 MED ORDER — ACETAMINOPHEN 325 MG PO TABS
650.0000 mg | ORAL_TABLET | ORAL | Status: DC | PRN
  Administered 2021-02-11 – 2021-02-12 (×2): 650 mg via ORAL

## 2021-02-10 MED ORDER — ONDANSETRON HCL 4 MG/2ML IJ SOLN
INTRAMUSCULAR | Status: DC | PRN
  Administered 2021-02-10: 4 mg via INTRAVENOUS

## 2021-02-10 MED ORDER — ROCURONIUM BROMIDE 10 MG/ML (PF) SYRINGE
PREFILLED_SYRINGE | INTRAVENOUS | Status: DC | PRN
  Administered 2021-02-10: 50 mg via INTRAVENOUS

## 2021-02-10 SURGICAL SUPPLY — 40 items
APL SKNCLS STERI-STRIP NONHPOA (GAUZE/BANDAGES/DRESSINGS) ×1
BAG COUNTER SPONGE SURGICOUNT (BAG) ×2 IMPLANT
BAG SPNG CNTER NS LX DISP (BAG) ×1
BENZOIN TINCTURE PRP APPL 2/3 (GAUZE/BANDAGES/DRESSINGS) ×2 IMPLANT
BLADE CLIPPER SURG (BLADE) IMPLANT
BLADE SURG 15 STRL LF DISP TIS (BLADE) ×1 IMPLANT
BLADE SURG 15 STRL SS (BLADE) ×2
CARTRIDGE OIL MAESTRO DRILL (MISCELLANEOUS) ×1 IMPLANT
CEMENT KYPHON C01A KIT/MIXER (Cement) ×1 IMPLANT
DEVICE BIOPSY BONE KYPHX (INSTRUMENTS) ×1 IMPLANT
DIFFUSER DRILL AIR PNEUMATIC (MISCELLANEOUS) ×2 IMPLANT
DRAPE C-ARM 42X72 X-RAY (DRAPES) ×2 IMPLANT
DRAPE HALF SHEET 40X57 (DRAPES) ×2 IMPLANT
DRAPE INCISE IOBAN 66X45 STRL (DRAPES) ×2 IMPLANT
DRAPE LAPAROTOMY 100X72X124 (DRAPES) ×2 IMPLANT
DRAPE SURG 17X23 STRL (DRAPES) ×8 IMPLANT
DRAPE WARM FLUID 44X44 (DRAPES) ×2 IMPLANT
GAUZE 4X4 16PLY ~~LOC~~+RFID DBL (SPONGE) ×2 IMPLANT
GAUZE SPONGE 4X4 12PLY STRL (GAUZE/BANDAGES/DRESSINGS) ×2 IMPLANT
GLOVE EXAM NITRILE XL STR (GLOVE) IMPLANT
GLOVE SURG ENC MOIS LTX SZ8 (GLOVE) ×2 IMPLANT
GLOVE SURG ENC MOIS LTX SZ8.5 (GLOVE) ×2 IMPLANT
GOWN STRL REUS W/ TWL LRG LVL3 (GOWN DISPOSABLE) IMPLANT
GOWN STRL REUS W/ TWL XL LVL3 (GOWN DISPOSABLE) IMPLANT
GOWN STRL REUS W/TWL LRG LVL3 (GOWN DISPOSABLE)
GOWN STRL REUS W/TWL XL LVL3 (GOWN DISPOSABLE)
KIT BASIN OR (CUSTOM PROCEDURE TRAY) ×2 IMPLANT
KIT TURNOVER KIT B (KITS) ×2 IMPLANT
NEEDLE HYPO 22GX1.5 SAFETY (NEEDLE) ×2 IMPLANT
NS IRRIG 1000ML POUR BTL (IV SOLUTION) ×2 IMPLANT
OIL CARTRIDGE MAESTRO DRILL (MISCELLANEOUS) ×2
PACK EENT II TURBAN DRAPE (CUSTOM PROCEDURE TRAY) ×2 IMPLANT
PAD ARMBOARD 7.5X6 YLW CONV (MISCELLANEOUS) ×6 IMPLANT
SPECIMEN JAR SMALL (MISCELLANEOUS) IMPLANT
STRIP CLOSURE SKIN 1/2X4 (GAUZE/BANDAGES/DRESSINGS) ×2 IMPLANT
SUT VIC AB 3-0 SH 8-18 (SUTURE) ×2 IMPLANT
SYR CONTROL 10ML LL (SYRINGE) ×2 IMPLANT
TOWEL GREEN STERILE (TOWEL DISPOSABLE) ×2 IMPLANT
TOWEL GREEN STERILE FF (TOWEL DISPOSABLE) ×2 IMPLANT
TRAY KYPHOPAK 20/3 EXPRESS 1ST (MISCELLANEOUS) ×1 IMPLANT

## 2021-02-10 NOTE — Anesthesia Postprocedure Evaluation (Signed)
Anesthesia Post Note  Patient: Todd Chapman  Procedure(s) Performed: KYPHOPLASTY L-5 (Back)     Patient location during evaluation: PACU Anesthesia Type: General Level of consciousness: awake and alert Pain management: pain level controlled Vital Signs Assessment: post-procedure vital signs reviewed and stable Respiratory status: spontaneous breathing, nonlabored ventilation and respiratory function stable Cardiovascular status: stable and blood pressure returned to baseline Anesthetic complications: no   No notable events documented.  Last Vitals:  Vitals:   02/10/21 1525 02/10/21 1540  BP: (!) 148/74 125/81  Pulse: 70 70  Resp: 14 19  Temp:  36.7 C  SpO2: 99% 100%    Last Pain:  Vitals:   02/10/21 1540  TempSrc:   PainSc: [place]

## 2021-02-10 NOTE — Anesthesia Procedure Notes (Signed)
Procedure Name: Intubation Date/Time: 02/10/2021 1:24 PM Performed by: Betha Loa, CRNA Pre-anesthesia Checklist: Patient identified, Emergency Drugs available, Suction available and Patient being monitored Patient Re-evaluated:Patient Re-evaluated prior to induction Oxygen Delivery Method: Circle System Utilized Preoxygenation: Pre-oxygenation with 100% oxygen Induction Type: IV induction Ventilation: Mask ventilation without difficulty Laryngoscope Size: Mac and 4 Grade View: Grade I Tube type: Oral Tube size: 7.5 mm Number of attempts: 1 Airway Equipment and Method: Stylet and Oral airway Placement Confirmation: ETT inserted through vocal cords under direct vision, positive ETCO2 and breath sounds checked- equal and bilateral Secured at: 22 cm Tube secured with: Tape Dental Injury: Teeth and Oropharynx as per pre-operative assessment

## 2021-02-10 NOTE — Progress Notes (Signed)
Subjective: The patient is alert and pleasant.  He is in no apparent distress.  He looks well.  Objective: Vital signs in last 24 hours: Temp:  [98 F (36.7 C)-98.9 F (37.2 C)] 98 F (36.7 C) (12/05 1455) Pulse Rate:  [69-97] 70 (12/05 1525) Resp:  [12-18] 14 (12/05 1525) BP: (113-164)/(69-96) 148/74 (12/05 1525) SpO2:  [94 %-100 %] 99 % (12/05 1525) Weight:  [92.1 kg] 92.1 kg (12/05 1050) Estimated body mass index is 26.06 kg/m as calculated from the following:   Height as of this encounter: 6\' 2"  (1.88 m).   Weight as of this encounter: 92.1 kg.   Intake/Output from previous day: 12/04 0701 - 12/05 0700 In: 59 [P.O.:490] Out: 1900 [Urine:1900] Intake/Output this shift: No intake/output data recorded.  Physical exam the patient is alert and oriented.  He is moving his lower extremities well.  Lab Results: Recent Labs    02/08/21 0331 02/09/21 1131  WBC 7.6 5.8  HGB 9.7* 12.1*  HCT 29.9* 37.6*  PLT 191 244   BMET Recent Labs    02/10/21 0156  NA 132*  K 4.4  CL 99  CO2 27  GLUCOSE 130*  BUN 12  CREATININE 0.78  CALCIUM 10.1    Studies/Results: DG C-Arm 1-60 Min-No Report  Result Date: 02/10/2021 Fluoroscopy was utilized by the requesting physician.  No radiographic interpretation.    Assessment/Plan: The patient is doing well.  I spoke with his wife.  LOS: 2 days     [name] 02/10/2021, 3:36 PM    Patient ID: Todd Chapman, male   DOB: 19-Sep-1945, 75 y.o.   MRN: [id number]

## 2021-02-10 NOTE — Anesthesia Preprocedure Evaluation (Addendum)
Anesthesia Evaluation  Patient identified by MRN, date of birth, ID band Patient awake    Reviewed: Allergy & Precautions, NPO status , Patient's Chart, lab work & pertinent test results  History of Anesthesia Complications Negative for: history of anesthetic complications  Airway Mallampati: II  TM Distance: >3 FB Neck ROM: Full    Dental  (+) Dental Advisory Given, Teeth Intact   Pulmonary former smoker,    Pulmonary exam normal        Cardiovascular + dysrhythmias Atrial Fibrillation + pacemaker  Rhythm:Irregular Rate:Normal   '22 Myoperfusion - The left ventricular ejection fraction is hyperdynamic (>65%). Nuclear stress EF: 70%. There was no ST segment deviation noted during stress. The study is normal. This is a low risk study.    Neuro/Psych negative neurological ROS  negative psych ROS   GI/Hepatic GERD  Medicated,(+) Hepatitis -, A  Endo/Other   Na 132   Renal/GU negative Renal ROS    Prostate cancer     Musculoskeletal  (+) Arthritis ,   Abdominal   Peds  Hematology  (+) anemia ,  On eliquis    Anesthesia Other Findings   Reproductive/Obstetrics                           Anesthesia Physical Anesthesia Plan  ASA: 3  Anesthesia Plan: General   Post-op Pain Management: Tylenol PO (pre-op)   Induction: Intravenous  PONV Risk Score and Plan: 2 and Treatment may vary due to age or medical condition and Ondansetron  Airway Management Planned: Oral ETT  Additional Equipment: None  Intra-op Plan:   Post-operative Plan: Extubation in OR  Informed Consent: I have reviewed the patients History and Physical, chart, labs and discussed the procedure including the risks, benefits and alternatives for the proposed anesthesia with the patient or authorized representative who has indicated his/her understanding and acceptance.     Dental advisory given  Plan Discussed  with: CRNA and Anesthesiologist  Anesthesia Plan Comments:        Anesthesia Quick Evaluation

## 2021-02-10 NOTE — Progress Notes (Signed)
Family Medicine Teaching Service Daily Progress Note Intern Pager: [phone]  Patient name: Todd Chapman Medical record number: [id number] Date of birth: 1945/06/29 Age: 75 y.o. Gender: male  Primary Care Provider: [name], MD Consultants: Neurosurgery Code Status: Full  Pt Overview and Major Events to Date:  12/1: Admitted for L5 compression fracture 12/2: Neurosurgery consulted, MR showing the L5 compression fracture as well as bilateral acetabular stress fractures 12/3: Ortho consulted and will plan for follow-up outpatient 12/5: kyphoplasty with neurosurgery  Assessment and Plan: Todd Chapman is a 75 y.o.male with L5 compression fracture and bilateral acetabular stress fractures.  Past medical history of A. fib on Eliquis status post pacemaker placement, prostate cancer status post radiation, HLD, GERD.  L5 compression fracture  bilateral acetabular stress fractures Plan with neurosurgery for kyphoplasty today.  Orthopedics recommended outpatient follow-up with Dr. Lyla Glassing. -Continue Oxy IR 10 mg every 4 -Continue calcitonin 1 spray alternating nares daily -Continue Tylenol 6 and 50 mg every 6 hours  Constipation: resolved Patient stooled yesterday. -Continue MiraLAX and senna daily  All other chronic, stable medical conditions medically managed with home medications as appropriate  FEN/GI: N.p.o. PPx: SCDs Dispo: Pending kyphoplasty.  Subjective:  Patient is doing well this morning.  He had a bowel movement yesterday.  Notes that his pain has been much better controlled with the scheduled pain medications.  He does not have any questions or concerns other than care after his surgery.  Objective: Temp:  [98 F (36.7 C)-98.7 F (37.1 C)] 98.5 F (36.9 C) (12/05 0733) Pulse Rate:  [69-97] 97 (12/05 0733) Resp:  [16-18] 18 (12/05 0733) BP: (113-164)/(71-96) 113/77 (12/05 0733) SpO2:  [94 %-100 %] 94 % (12/05 0733) Physical Exam: General: WDWN,  NAD Cardiovascular: Irregularly irregular, no murmur Respiratory: CTA B, normal work of breathing  Laboratory: Recent Labs  Lab 02/07/21 0539 02/08/21 0331 02/09/21 1131  WBC 10.2 7.6 5.8  HGB 10.4* 9.7* 12.1*  HCT 31.9* 29.9* 37.6*  PLT 187 191 244     Imaging/Diagnostic Tests: No results found.  Wells Guiles, DO 02/10/2021, 8:21 AM PGY-1, [facility] Intern pager: [phone], text pages welcome

## 2021-02-10 NOTE — Progress Notes (Signed)
FPTS Brief Progress Note  S: Todd Chapman was sleeping comfortably during my rounds. Discussed care with primary RN who reported adequate post-operative pain control current regimen. No acute needs at this time.    O: BP (!) 96/54 (BP Location: Left Arm)   Pulse 79   Temp 98.2 F (36.8 C) (Oral)   Resp 18   Ht 6\' 2"  (1.88 m)   Wt 92.1 kg   SpO2 98%   BMI 26.06 kg/m   Gen: patient sleeping comfortably Pulm: Normal WOB on RA  A/P: L5 Compression Fracture  Bilateral Acetabular Stress Fracture S/p kyphoplasty. Pain adequately controlled.  - Continue Oxy 10mg  q4h with additional doses PRN for breakthrough pain - IV morphine 4mg  q2 PRN for pain not controlled on oral agents  Remainder of management per day team's note.  - Orders reviewed. Adjusted as needed.   Eppie Gibson, MD 02/10/2021, 10:34 PM PGY-1, [facility] Night Resident  Please page [phone] with questions.

## 2021-02-10 NOTE — Transfer of Care (Signed)
Immediate Anesthesia Transfer of Care Note  Patient: Todd Chapman  Procedure(s) Performed: KYPHOPLASTY L-5 (Back)  Patient Location: PACU  Anesthesia Type:General  Level of Consciousness: awake, alert  and oriented  Airway & Oxygen Therapy: Patient Spontanous Breathing  Post-op Assessment: Report given to RN and Post -op Vital signs reviewed and stable  Post vital signs: Reviewed and stable  Last Vitals:  Vitals Value Taken Time  BP 146/88 02/10/21 1454  Temp    Pulse 85 02/10/21 1456  Resp 20 02/10/21 1456  SpO2 100 % 02/10/21 1456  Vitals shown include unvalidated device data.  Last Pain:  Vitals:   02/10/21 1106  TempSrc:   PainSc: 0-No pain      Patients Stated Pain Goal: 3 (41/63/84 5364)  Complications: No notable events documented.

## 2021-02-10 NOTE — Op Note (Signed)
Brief history: The patient is a 75 year old white male with a history of prostate cancer treated with hormone therapy and radiation who presented with back pain.  He was worked up with a lumbar MRI which demonstrated a L5 compression fracture and bilateral acetabular insufficiency fractures.  I discussed the situation with him.  He decided proceed with surgery.  Preop diagnosis: L5 compression fracture, lumbago  Postop diagnosis: The same  Procedure: Bilateral L5 kyphoplasty and vertebral body biopsies  Surgeon: Dr. Earle Gell  Assistant: None  Anesthesia: General tracheal  Estimated blood loss: Minimal  Specimens: Bilateral L5 vertebral body biopsies  Drains: None  Complications: None  Description of procedure: The patient was brought to the operating room by the anesthesia team.  General endotracheal anesthesia was induced.  The patient was turned to the prone position on the [place] table.  His lumbosacral region was then prepared with Betadine scrub and Betadine solution.  Sterile drapes were applied.  I then injected the area to be incised with Marcaine with epinephrine solution.  I used the 15 blade scalpel to make small incisions over the patient's bilateral L5 pedicles.  Under biplanar fluoroscopy I cannulated the patient's bilateral L5 pedicles with a Jamshidi needle.  I then removed the trocar and obtained a bilateral L5 vertebral body bone biopsy.  I then drilled down the Jamshidi needle remove the drill, inserted the balloon, inflated the balloon, deflated the balloon, removed the balloon, and then filled the bone void with bone cement under biplanar fluoroscopy.  When we were satisfied with the bone fell we remove the Jamshidi needles.  The patient's subcutaneous uterine tissue was then reapproximated with interrupted 3-0 Vicryl suture.  The skin was reapproximated with Steri-Strips and benzoin.  The wound was then coated with bacitracin ointment.  A sterile dressing was  applied.  The drapes were removed.  By report all sponge, instrument, and needle counts were correct at the end of this case.

## 2021-02-10 NOTE — Progress Notes (Signed)
Orthopedic Tech Progress Note Patient Details:  Todd Chapman 1945-05-19 [id number]  PACU RN called requesting an LSO BACK BRACE   Ortho Devices Type of Ortho Device: Lumbar corsett Ortho Device/Splint Location: BACK Ortho Device/Splint Interventions: Ordered   Post Interventions Patient Tolerated: Well Instructions Provided: [facility] [place] 02/10/2021, 3:38 PM

## 2021-02-10 NOTE — Progress Notes (Signed)
Received pt to room from PACU,  Dr Thompson Grayer in the room with the pt.  Pt is complaining of right hip muscle pain.  Scheduled oxy and tylenol given to help relieve the pain.  Enc pt to use the brace when getting oob ie protocol.  Pt is very concerned about getting back on his eliquis,  concerned passed to Dr Thompson Grayer.  Waiting for a response.

## 2021-02-10 NOTE — Progress Notes (Signed)
PT Cancellation Note  Patient Details Name: Todd Chapman MRN: [id number] DOB: 20-Jun-1945   Cancelled Treatment:    Reason Eval/Treat Not Completed: Other (comment) Pt going down for kyphoplasty. Will follow up post procedure.   Marguarite Arbour A Linsey Arteaga 02/10/2021, 10:23 AM Marisa Severin, PT, DPT Acute Rehabilitation Services Pager [phone] Office [phone]

## 2021-02-10 NOTE — Progress Notes (Signed)
Subjective: The patient is alert and pleasant.  His wife at the bedside.  Objective: Vital signs in last 24 hours: Temp:  [98 F (36.7 C)-98.9 F (37.2 C)] 98.9 F (37.2 C) (12/05 1050) Pulse Rate:  [69-97] 77 (12/05 1050) Resp:  [16-18] 18 (12/05 1050) BP: (113-164)/(69-96) 114/69 (12/05 1050) SpO2:  [94 %-100 %] 98 % (12/05 1050) Weight:  [92.1 kg] 92.1 kg (12/05 1050) Estimated body mass index is 26.06 kg/m as calculated from the following:   Height as of this encounter: 6\' 2"  (1.88 m).   Weight as of this encounter: 92.1 kg.   Intake/Output from previous day: 12/04 0701 - 12/05 0700 In: 87 [P.O.:490] Out: 1900 [Urine:1900] Intake/Output this shift: No intake/output data recorded.  Physical exam the patient is alert and pleasant.  His lower extremity strength is grossly normal.  Lab Results: Recent Labs    02/08/21 0331 02/09/21 1131  WBC 7.6 5.8  HGB 9.7* 12.1*  HCT 29.9* 37.6*  PLT 191 244   BMET Recent Labs    02/10/21 0156  NA 132*  K 4.4  CL 99  CO2 27  GLUCOSE 130*  BUN 12  CREATININE 0.78  CALCIUM 10.1    Studies/Results: No results found.  Assessment/Plan: L5 compression fracture, lumbago: I have discussed the situation again with the patient.  I have answered all his questions regarding a L5 kyphoplasty.  He wants to proceed with surgery.  LOS: 2 days     [name] 02/10/2021, 11:44 AM

## 2021-02-11 ENCOUNTER — Encounter (HOSPITAL_COMMUNITY): Payer: Self-pay | Admitting: Neurosurgery

## 2021-02-11 DIAGNOSIS — S32050A Wedge compression fracture of fifth lumbar vertebra, initial encounter for closed fracture: Secondary | ICD-10-CM | POA: Diagnosis not present

## 2021-02-11 LAB — CBC
HCT: 33.2 % — ABNORMAL LOW (ref 39.0–52.0)
Hemoglobin: 10.6 g/dL — ABNORMAL LOW (ref 13.0–17.0)
MCH: 30.3 pg (ref 26.0–34.0)
MCHC: 31.9 g/dL (ref 30.0–36.0)
MCV: 94.9 fL (ref 80.0–100.0)
Platelets: 194 10*3/uL (ref 150–400)
RBC: 3.5 MIL/uL — ABNORMAL LOW (ref 4.22–5.81)
RDW: 13.7 % (ref 11.5–15.5)
WBC: 5.2 10*3/uL (ref 4.0–10.5)
nRBC: 0 % (ref 0.0–0.2)

## 2021-02-11 LAB — BASIC METABOLIC PANEL
Anion gap: 8 (ref 5–15)
BUN: 12 mg/dL (ref 8–23)
CO2: 26 mmol/L (ref 22–32)
Calcium: 10 mg/dL (ref 8.9–10.3)
Chloride: 99 mmol/L (ref 98–111)
Creatinine, Ser: 0.82 mg/dL (ref 0.61–1.24)
GFR, Estimated: >60 mL/min (ref >60)
Glucose, Bld: 104 mg/dL — ABNORMAL HIGH (ref 70–99)
Potassium: 4.5 mmol/L (ref 3.5–5.1)
Sodium: 133 mmol/L — ABNORMAL LOW (ref 135–145)

## 2021-02-11 MED ORDER — APIXABAN 5 MG PO TABS
5.0000 mg | ORAL_TABLET | Freq: Two times a day (BID) | ORAL | Status: DC
  Administered 2021-02-11 – 2021-02-12 (×3): 5 mg via ORAL
  Filled 2021-02-11 (×3): qty 1

## 2021-02-11 NOTE — Progress Notes (Signed)
Family Medicine Teaching Service Daily Progress Note Intern Pager: [phone]  Patient name: Todd Chapman Medical record number: [id number] Date of birth: 10-04-1945 Age: 75 y.o. Gender: male  Primary Care Provider: [name], MD Consultants: Neurosurgery Code Status: Full  Pt Overview and Major Events to Date:  12/1: Admitted for L5 compression fracture 12/2: Neurosurgery consulted, MR showing the L5 compression fracture as well as bilateral acetabular stress fractures 12/3: Ortho consulted and will plan for follow-up outpatient 12/5: kyphoplasty with neurosurgery  Assessment and Plan: Todd Chapman is a 75 y.o.male with L5 compression fracture and bilateral acetabular stress fractures.  Past medical history of A. fib on Eliquis status post pacemaker placement, prostate cancer status post radiation, HLD, GERD.  L5 compression fracture status post kyphoplasty  bilateral acetabular stress fractures Patient received kyphoplasty yesterday with neurosurgery.  Neurosurgery is consulted orthopedics who may evaluate patient. Pain appears controlled with Oxy, although not completely dissipated. Awaiting PT eval and treat. -Neurosurgery following, appreciate recs -PT eval and treat, appreciate efforts -Continue oxycodone 10 mg every 4 hours, acetaminophen 1000 mg every 6 hours -Oxycodone 10 mg every 3 hours as needed for severe pain, oxycodone 5 mg every 3 hours as needed for moderate pain, Tylenol every 4 hours as needed for mild pain -Continue calcitonin 1 spray alternating nares daily -Restarted Eliquis, per neurosurgery recs  All other chronic, stable conditions medically managed with home medications as appropriate.  FEN/GI: Heart healthy PPx: Eliquis Dispo:Pending PT recommendations   Subjective:  Patient states he is unsure if he will walk again that he may need to alter his practice because of this.  He does not feel that he is able to predict what his future may hold and is  despaired by needing to go with information as it comes.  He notes that he is attempting to plan ahead for his meals and expectation for PT.  He also feels despair that orthopedics did not feel it appropriate to intervene while he was in the hospital and given that he "cannot walk" he is unsure if he will be able to see them outpatient.  Patient notes his pain is still present but better controlled with Oxy and Tylenol.  Objective: Temp:  [98 F (36.7 C)-98.9 F (37.2 C)] 98.1 F (36.7 C) (12/06 0452) Pulse Rate:  [67-97] 67 (12/06 0452) Resp:  [12-19] 16 (12/06 0452) BP: (96-155)/(54-92) 122/92 (12/06 0452) SpO2:  [94 %-100 %] 100 % (12/06 0452) Weight:  [92.1 kg] 92.1 kg (12/05 1050) Physical Exam: General: NAD, depressed mood in conversation but appropriately able to converse Cardiovascular: irregularly irregular, no murmur auscultated Respiratory: CTAB, no increased work of breathing  Laboratory: Recent Labs  Lab 02/08/21 0331 02/09/21 1131 02/11/21 0057  WBC 7.6 5.8 5.2  HGB 9.7* 12.1* 10.6*  HCT 29.9* 37.6* 33.2*  PLT 191 244 194   Recent Labs  Lab 02/06/21 1228 02/08/21 0331 02/10/21 0156 02/11/21 0057  NA 137  --  132* 133*  K 3.6  --  4.4 4.5  CL 103  --  99 99  CO2 25  --  27 26  BUN 15  --  12 12  CREATININE 0.77  --  0.78 0.82  CALCIUM 10.1  --  10.1 10.0  PROT 6.5 5.5*  --   --   BILITOT 1.5* 0.9  --   --   ALKPHOS 91 76  --   --   ALT 24 22  --   --   AST  26 24  --   --   GLUCOSE 108*  --  130* 104*   Imaging/Diagnostic Tests: DG Lumbar Spine 2-3 Views  Result Date: 02/10/2021 CLINICAL DATA:  Kyphoplasty EXAM: LUMBAR SPINE - 2-3 VIEW COMPARISON:  MRI 02/07/2021 FINDINGS: Single low resolution intraoperative spot view of the lumbar spine. Total fluoroscopy time was 2 minutes 28 seconds. The image demonstrates vertebral augmentation at L5. IMPRESSION: Intraoperative fluoroscopic assistance provided during kyphoplasty Electronically Signed   By: Donavan Foil M.D.   On: 02/10/2021 16:15   DG C-Arm 1-60 Min-No Report  Result Date: 02/10/2021 Fluoroscopy was utilized by the requesting physician.  No radiographic interpretation.     Wells Guiles, DO 02/11/2021, 6:37 AM PGY-1, [place] Intern pager: [phone], text pages welcome

## 2021-02-11 NOTE — Progress Notes (Signed)
Inpatient Rehab Admissions Coordinator:   Met with patient at the bedside to discuss CIR goals/expectations.  I reviewed that CIR would be about 3 hrs/day of therapy, and average length of stay is about 2 weeks.  We discussed Medicare coverage of CIR and home set up.  Pt reports that his spouse works out of the home 2x/week and works from home 3x/week.  I do feel pt should reach a level where he could be alone at home while she is working.  I will follow for timing of potential admission pending bed availability.   Shann Medal, PT, DPT Admissions Coordinator [phone] 02/11/21  2:53 PM

## 2021-02-11 NOTE — Progress Notes (Addendum)
Subjective: The patient is alert and pleasant.  He is sitting at the bedside.  Unfortunately he says he has "the same pain in my right hip" describing pain in the region of the right SI joint.  He has again requested that orthopedics evaluate him again.  He does not feel he is able to go home because he cannot walk.  Objective: Vital signs in last 24 hours: Temp:  [98 F (36.7 C)-98.9 F (37.2 C)] 98.1 F (36.7 C) (12/06 0452) Pulse Rate:  [67-92] 67 (12/06 0452) Resp:  [12-19] 16 (12/06 0452) BP: (96-155)/(54-92) 122/92 (12/06 0452) SpO2:  [96 %-100 %] 100 % (12/06 0452) Weight:  [92.1 kg] 92.1 kg (12/05 1050) Estimated body mass index is 26.06 kg/m as calculated from the following:   Height as of this encounter: 6\' 2"  (1.88 m).   Weight as of this encounter: 92.1 kg.   Intake/Output from previous day: 12/05 0701 - 12/06 0700 In: 500.8 [P.O.:480; I.V.:20.8] Out: 1000 [Urine:1000] Intake/Output this shift: Total I/O In: -  Out: 100 [Urine:100]  Physical exam patient is alert and pleasant.  His lower extremity strength is normal.  His dressing is clean and dry.  Lab Results: Recent Labs    02/09/21 1131 02/11/21 0057  WBC 5.8 5.2  HGB 12.1* 10.6*  HCT 37.6* 33.2*  PLT 244 194   BMET Recent Labs    02/10/21 0156 02/11/21 0057  NA 132* 133*  K 4.4 4.5  CL 99 99  CO2 27 26  GLUCOSE 130* 104*  BUN 12 12  CREATININE 0.78 0.82  CALCIUM 10.1 10.0    Studies/Results: DG Lumbar Spine 2-3 Views  Result Date: 02/10/2021 CLINICAL DATA:  Kyphoplasty EXAM: LUMBAR SPINE - 2-3 VIEW COMPARISON:  MRI 02/07/2021 FINDINGS: Single low resolution intraoperative spot view of the lumbar spine. Total fluoroscopy time was 2 minutes 28 seconds. The image demonstrates vertebral augmentation at L5. IMPRESSION: Intraoperative fluoroscopic assistance provided during kyphoplasty Electronically Signed   By: Donavan Foil M.D.   On: 02/10/2021 16:15   DG C-Arm 1-60 Min-No Report  Result  Date: 02/10/2021 Fluoroscopy was utilized by the requesting physician.  No radiographic interpretation.    Assessment/Plan: Postop day #1: Unfortunately still having the same right "hip" pain status post a kyphoplasty.  Perhaps his acetabular insufficiency fracture is symptomatic.  I will ask Ortho to see the patient again.  I will ask rehab to see the patient.  I have answered his questions multiple times.  LOS: 3 days     [name] 02/11/2021, 7:57 AM     Patient ID: Todd Chapman, male   DOB: 10-23-1945, 75 y.o.   MRN: [id number]

## 2021-02-11 NOTE — Evaluation (Signed)
Physical Therapy Evaluation Patient Details Name: Todd Chapman MRN: [id number] DOB: 1945-11-18 Today's Date: 02/11/2021  History of Present Illness  Pt is a 75 year old man admitted on 02/06/21 with worsening back pain. 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 MRI confirmed L5 fx and B acetabular stress fxs to be managed with outpatient orthopedist. PMH: afib on Eliquis, pacemaker, HLD, SBO, prostate ca s/p radiation.  Clinical Impression   Pt admitted with above diagnosis. Now s/p kyphoplasty for the L5 compression fx; Presents to PT with continued pain limiting ability to bear weight though Bil hips, and he is highly dependent on RW for support during amb; Currently needing Mod/Max assist (2 person assist for safety); Uses RW well to unweigh painful LEs, however it makes RW advancement and turning difficult for him;  He participated well, despite pain, and I'm hopeful that he will make good progress as his pain control regimen is solidified; Pt currently with functional limitations due to the deficits listed below (see PT Problem List). Pt will benefit from skilled PT to increase their independence and safety with mobility to allow discharge to the venue listed below.    Pt with many questions and concerns re: dc planning; took extra time to address his concerns, and describe the process of decision-making, qualifiers for Acute Inpt Rehab vs. SNF level Rehab; Given that he was able to participate despite considerable pain, that he is aware of the possible need for more help at home and is willing to make arrangements for more assist as needed, his need for both PT and OT for mobility and ADLs, and that Neurosurgery and Ortho are able to get behind an Acute Inpt Rehab stay to maximize independence and safety with mobility and ADLs and allow for safe dc home, I believe it is worth considering an Acute Inpt Rehab stay post-acutely; Pt would like to avoid  going to rehab at SNF        Recommendations for follow up therapy are one component of a multi-disciplinary discharge planning process, led by the attending physician.  Recommendations may be updated based on patient status, additional functional criteria and insurance authorization.  Follow Up Recommendations Acute inpatient rehab (3hours/day)    Assistance Recommended at Discharge Frequent or constant Supervision/Assistance  Functional Status Assessment Patient has had a recent decline in their functional status and demonstrates the ability to make significant improvements in function in a reasonable and predictable amount of time.  Equipment Recommendations  Rolling walker (2 wheels);BSC/3in1;Hospital bed    Recommendations for Other Services Rehab consult;OT consult (as ordered)     Precautions / Restrictions Precautions Precautions: Back;Fall Precaution Comments: Reinforced back precautions; Pt will tend to twist when demonstrating something, or pointing out where pain is; cues to be more aware Restrictions Weight Bearing Restrictions: No Other Position/Activity Restrictions: Per Ortho consult, WBAT bil hips      Mobility  Bed Mobility               General bed mobility comments: Pt sitting EOB upon arrival    Transfers Overall transfer level: Needs assistance Equipment used: Rolling walker (2 wheels) Transfers: Sit to/from Stand Sit to Stand: Mod assist;+2 safety/equipment           General transfer comment: Light mod assist to steady; very slow rise, pt dependent on UEs pushing up on RW to get to fully upright standing    Ambulation/Gait Ambulation/Gait assistance: Mod assist;Max assist;+2 safety/equipment Gait Distance (Feet):  5 Feet Assistive device: Rolling walker (2 wheels) Gait Pattern/deviations: Decreased step length - right;Decreased step length - left Gait velocity: very slow     General Gait Details: Very slow moving; Heavy dependence on UE  support on RW; Extremely tense throughout upper body; Requires extensive assist to move, advance, and turn RW because he puts a lot of weight through RW to [place] hips/LEs; R UE tended to fatigue, and pt requested support at R axilla towards the end of the walk; overall very tense, and anxious with walking  Stairs            Wheelchair Mobility    Modified Rankin (Stroke Patients Only)       Balance     Sitting balance-Leahy Scale: Fair     Standing balance support: Bilateral upper extremity supported Standing balance-Leahy Scale: Poor Standing balance comment: Reliant on BUE support, less than upon discharge                             Pertinent Vitals/Pain Pain Assessment: Faces Faces Pain Scale: Hurts whole lot Pain Location: back Pain Descriptors / Indicators: Grimacing;Guarding (Extremely tense and internally distracted by pain) Pain Intervention(s): RN gave pain meds during session;Repositioned    Home Living Family/patient expects to be discharged to:: Private residence Living Arrangements: Spouse/significant other Available Help at Discharge: Family Type of Home: House Home Access: Stairs to enter Entrance Stairs-Rails: None Entrance Stairs-Number of Steps: 2 Alternate Level Stairs-Number of Steps: 14 Home Layout: Multi-level;Able to live on main level with bedroom/bathroom Home Equipment: Cane - single point;Rollator (4 wheels)      Prior Function Prior Level of Function : Independent/Modified Independent             Mobility Comments: Has been using cane for ambulation ADLs Comments: independent     Hand Dominance   Dominant Hand: Right    Extremity/Trunk Assessment   Upper Extremity Assessment Upper Extremity Assessment: Defer to OT evaluation    Lower Extremity Assessment Lower Extremity Assessment: Generalized weakness (and pain limiting ability to fully bear weight through bilateral hips)    Cervical / Trunk  Assessment Cervical / Trunk Assessment: Other exceptions Cervical / Trunk Exceptions: L5 compression fx, s/p kyphoplasty  Communication   Communication: No difficulties  Cognition Arousal/Alertness: Awake/alert Behavior During Therapy: WFL for tasks assessed/performed Overall Cognitive Status: Within Functional Limits for tasks assessed                                          General Comments General comments (skin integrity, edema, etc.): Pt with many questions and concerns re: dc planning; took extra time to address his concerns, and describe the process of decision-making, qualifiers for Acute Inpt Rehab vs. SNF level Rehab; Pt would like to avoid going to rehab at [facility]    Exercises     Assessment/Plan    PT Assessment Patient needs continued PT services  PT Problem List Decreased activity tolerance;Decreased mobility;Decreased balance;Decreased knowledge of use of DME;Decreased knowledge of precautions;Pain       PT Treatment Interventions DME instruction;Gait training;Stair training;Functional mobility training;Therapeutic activities;Therapeutic exercise;Balance training;Patient/family education    PT Goals (Current goals can be found in the Care Plan section)  Acute Rehab PT Goals Patient Stated Goal: to decrease pain and be active again; Wants to get to Acute Inpt Rehab;  would like to avoid SNF level rehab PT Goal Formulation: With patient (Goals set on initial PT eval on 12/2 continue to be appropriate) Time For Goal Achievement: 02/25/21 Potential to Achieve Goals: Good    Frequency Min 4X/week   Barriers to discharge Other (comment) Pt is looking into how much help wife and family can provide at home; sounds like he is willing to hire caregivers if needed    Co-evaluation PT/OT/SLP Co-Evaluation/Treatment: Yes Reason for Co-Treatment: For patient/therapist safety;To address functional/ADL transfers PT goals addressed during session: Mobility/safety  with mobility         AM-PAC PT "6 Clicks" Mobility  Outcome Measure Help needed turning from your back to your side while in a flat bed without using bedrails?: A Little Help needed moving from lying on your back to sitting on the side of a flat bed without using bedrails?: A Little Help needed moving to and from a bed to a chair (including a wheelchair)?: A Lot Help needed standing up from a chair using your arms (e.g., wheelchair or bedside chair)?: A Lot Help needed to walk in hospital room?: A Lot Help needed climbing 3-5 steps with a railing? : Total 6 Click Score: 13    End of Session Equipment Utilized During Treatment: Back brace Activity Tolerance: Other (comment) (Still participating well, despite significant pain) Patient left: in chair;with call bell/phone within reach Nurse Communication: Mobility status;Other (comment) (malfunctioning chair alarm) PT Visit Diagnosis: Pain;Difficulty in walking, not elsewhere classified (R26.2) Pain - Right/Left:  (Bilateral hips and back) Pain - part of body: Hip (Bilateral hips and back)    Time: 9702-6378 PT Time Calculation (min) (ACUTE ONLY): 49 min   Charges:   PT Evaluation $PT Re-evaluation: 1 Re-eval PT Treatments $Gait Training: 8-22 mins        Roney Marion, PT  Acute Rehabilitation Services Pager [phone] Office [place] 02/11/2021, 12:58 PM

## 2021-02-11 NOTE — Progress Notes (Signed)
FPTS Brief Progress Note  S: Patient sleeping soundly during my rounds, did not wake him.    O: BP (!) 145/93 (BP Location: Left Arm)   Pulse 88   Temp 99.8 F (37.7 C) (Oral)   Resp 17   Ht 6\' 2"  (1.88 m)   Wt 92.1 kg   SpO2 99%   BMI 26.06 kg/m   Gen: Sleeping comfortably  Pulm: Nl WOB on RA  A/P: L5 Compression Fracture S/p kyphoplasty.Not currently requiring PRN oxy or morphine. Pain seems acceptably controlled just with scheduled oxycodone, calcitonin, and acetaminophen. Per chart review, it appears patient may be a candidate for CIR after discharge.  - Continue current management - Discharge to CIR pending bed availability  Bilateral Acetabular Stress Fractures Re-evaluated by orthopedics today. They do not believe these are clinically significant or would preclude progression to weightbearing.  - Metabolic bone disease workup, labs in for tomorrow morning  Remainder of management per day team's note  - Orders reviewed, adjusted as needed.   Eppie Gibson, MD 02/11/2021, 11:58 PM PGY-1, [place] Night Resident  Please page [phone] with questions.

## 2021-02-11 NOTE — Progress Notes (Signed)
Inpatient Rehab Admissions Coordinator:   Consult received.  Awaiting therapy evaluations and will f/u.   Shann Medal, PT, DPT Admissions Coordinator [phone] 02/11/21  9:42 AM

## 2021-02-11 NOTE — Progress Notes (Addendum)
Occupational Therapy Re-Eval  Pt presents with pain, decreased balance, and decreased activity tolerance. Currently requiring Mod A for LB ADLs and Mod - Max A +2 for functional transfers/mobility. Pt is highly anxious and focused on pain during activity. Motivated to participate despite pain/anxiety. Independent at baseline and lives with wife, however she works and will be unavailable to provide assistance at home for most of the day. Pt is adamantly opposed to SNF and highly interested in CIR. Due to PLOF, level of deficit, and motivation to participate, I feel CIR could be beneficial to consider. Will follow acutely and update d/c recommendations as needed.   02/11/21 1300  OT Visit Information  Last OT Received On 02/11/21  Assistance Needed +2  PT/OT/SLP Co-Evaluation/Treatment Yes  Reason for Co-Treatment To address functional/ADL transfers;For patient/therapist safety  OT goals addressed during session ADL's and self-care;Strengthening/ROM;Proper use of Adaptive equipment and DME  History of Present Illness Pt is a 75 year old man admitted on 02/06/21 with worsening back pain. 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 MRI confirmed L5 fx and B acetabular stress fxs to be managed with outpatient orthopedist. PMH: afib on Eliquis, pacemaker, HLD, SBO, prostate ca s/p radiation.  Precautions  Precautions Back;Fall  Precaution Booklet Issued Yes (comment)  Precaution Comments Reinforced back precautions; Pt will tend to twist when demonstrating something, or pointing out where pain is; cues to be more aware  Restrictions  Weight Bearing Restrictions No  Other Position/Activity Restrictions Per Ortho consult, WBAT bil hips  Home Living  Family/patient expects to be discharged to: Private residence  Living Arrangements Spouse/significant other  Available Help at Discharge Family  Type of [place] to enter  Entrance  Stairs-Number of Steps 2  Entrance Stairs-Rails None  Home Layout Multi-level;Able to live on main level with bedroom/bathroom  Alternate Level Stairs-Number of Steps 14  Alternate Level Stairs-Rails Right  Bathroom Shower/Tub Walk-in shower  Bathroom Toilet Handicapped height  [place] - single point;Rollator (4 wheels)  Prior Function  Prior Level of Function  Independent/Modified Independent  Mobility Comments Has been using cane for ambulation  ADLs Comments independent  Communication  Communication No difficulties  Pain Assessment  Pain Assessment Faces  Faces Pain Scale 8  Pain Location back  Pain Descriptors / Indicators Grimacing;Guarding  Pain Intervention(s) Monitored during session;RN gave pain meds during session;Repositioned  Cognition  Arousal/Alertness Awake/alert  Behavior During Therapy WFL for tasks assessed/performed  Overall Cognitive Status Within Functional Limits for tasks assessed  Upper Extremity Assessment  Upper Extremity Assessment Overall WFL for tasks assessed  Lower Extremity Assessment  Lower Extremity Assessment Defer to PT evaluation  Cervical / Trunk Assessment  Cervical / Trunk Assessment Other exceptions  Cervical / Trunk Exceptions L5 compression fx, s/p kyphoplasty  Vision- History  Ability to See in Adequate Light 0 Adequate  Patient Visual Report No change from baseline  ADL  Overall ADL's  Needs assistance/impaired  Eating/Feeding Independent  Grooming Set up;Sitting  Upper Body Bathing Set up;Sitting  Lower Body Bathing Moderate assistance;Sitting/lateral leans  Upper Body Dressing  Set up;Sitting  Lower Body Dressing Moderate assistance;Sit to/from stand;+2 for safety/equipment  Toilet Transfer Moderate assistance;+2 for physical assistance;+2 for safety/equipment;Ambulation;BSC/3in1;Rolling walker (2 wheels);Cueing for safety  Functional mobility during ADLs Moderate assistance;Maximal assistance;+2 for physical  assistance;+2 for safety/equipment;Cueing for safety;Rolling walker (2 wheels)  Bed Mobility  General bed mobility comments Sitting EOB on arrival  Transfers  Overall transfer  level Needs assistance  Equipment used Rolling walker (2 wheels)  Transfers Sit to/from Stand  Sit to Stand Mod assist;+2 safety/equipment  General transfer comment Light mod assist to steady; very slow rise, pt dependent on UEs pushing up on RW to get to fully upright standing  Balance  Overall balance assessment Needs assistance  Sitting-balance support No upper extremity supported;Feet supported  Sitting balance-Leahy Scale Fair  Standing balance support Bilateral upper extremity supported  Standing balance-Leahy Scale Poor  OT - End of Session  Equipment Utilized During Treatment Rolling walker (2 wheels);Gait belt  Activity Tolerance Patient tolerated treatment well  Patient left in chair;with call bell/phone within reach;with chair alarm set  Nurse Communication Mobility status  OT Assessment  OT Recommendation/Assessment Patient needs continued OT Services  OT Visit Diagnosis Unsteadiness on feet (R26.81);Other abnormalities of gait and mobility (R26.89);Pain  OT Problem List Impaired balance (sitting and/or standing);Pain;Decreased knowledge of use of DME or AE;Decreased knowledge of precautions  Barriers to Discharge Decreased caregiver support  OT Plan  OT Frequency (ACUTE ONLY) Min 3X/week  OT Treatment/Interventions (ACUTE ONLY) Self-care/ADL training;DME and/or AE instruction;Patient/family education;Balance training;Therapeutic activities  AM-PAC OT "6 Clicks" Daily Activity Outcome Measure (Version 2)  Help from another person eating meals? 4  Help from another person taking care of personal grooming? 3  Help from another person toileting, which includes using toliet, bedpan, or urinal? 2  Help from another person bathing (including washing, rinsing, drying)? 2  Help from another person to put on  and taking off regular upper body clothing? 3  Help from another person to put on and taking off regular lower body clothing? 2  6 Click Score 16  Progressive Mobility  What is the highest level of mobility based on the progressive mobility assessment? Level 4 (Walks with assist in room) - Balance while marching in place and cannot step forward and back - Complete  Mobility Out of bed for toileting;Out of bed to chair with meals;Ambulated with assistance in room  OT Recommendation  Recommendations for Other Services Rehab consult  Follow Up Recommendations Acute inpatient rehab (3hours/day)  Assistance recommended at discharge Frequent or constant Supervision/Assistance  Functional Status Assessent Patient has had a recent decline in their functional status and demonstrates the ability to make significant improvements in function in a reasonable and predictable amount of time.  OT Equipment BSC/3in1;Hospital bed  Individuals Consulted  Consulted and Agree with Results and Recommendations Patient  Acute Rehab OT Goals  Patient Stated Goal CIR then home  OT Goal Formulation With patient  Time For Goal Achievement 02/25/21  Potential to Achieve Goals Good  OT Time Calculation  OT Start Time (ACUTE ONLY) 1050  OT Stop Time (ACUTE ONLY) 1144  OT Time Calculation (min) 54 min  OT General Charges  $OT Visit 1 Visit  OT Evaluation  $OT Re-eval 1 Re-eval  OT Treatments  $Therapeutic Activity 8-22 mins  Written Expression  Dominant Hand Right   Mohamad Bruso C, OT/L  Acute Rehab [phone]

## 2021-02-11 NOTE — PMR Pre-admission (Signed)
PMR Admission Coordinator Pre-Admission Assessment  Patient: Todd Chapman is an 75 y.o., male MRN: [id number] DOB: 31-Oct-1945 Height: 6\' 2"  (188 cm) Weight: 92.1 kg  Insurance Information HMO:     PPO:      PCP:      IPA:      80/20:      OTHER:  PRIMARY: Medicare A/B      Policy#: [id number]      Subscriber: pt CM Name:       Phone#:      Fax#:  Pre-Cert#: verified Civil engineer, contracting:  Benefits:  Phone #:      Name:  Eff. Date: A 04/09/08, B 05/07/08     Deduct: $1556      Out of Pocket Max: n/a      Life Max: n/a CIR: 100%      SNF: 20 full days Outpatient: 80%     Co-Ins: 20% Home Health: 100%      Co-Pay:  DME: 80%     Co-Ins: 20% Providers:  SECONDARY: BCBS Supplement      Policy#: [id number]     Phone#: [phone]  Financial Counselor:       Phone#:   The "Data Collection Information Summary" for patients in Inpatient Rehabilitation Facilities with attached "Privacy Act [place] Records" was provided and verbally reviewed with: Patient  Emergency Contact Information Contact Information     Name Relation Home Work Mobile   [place] Spouse [phone]  [phone]       Current Medical History  Patient Admitting Diagnosis: multiple fractures  History of Present Illness: Pt is a 75 y/o male with PMH of afib (on eliquis with pacemaker), prostate cancer (treated with radiation), and aspiration PNA admitted on 02/06/21 to [facility] with radiating back pain with concern for malignant process.  ED course showed WBC 15 (in the setting of recent prednisone taper), HR 162, BP 149/112, resp 29, SpO2 100% on room air.  CT lumbar spine showed compression fracture at L5, supported by abd/pelvic CT.  Neurosurgery was consulted and pt underwent kyphoplasty on 12/5 per Dr. Arnoldo Morale.  MRI lumbar/pelvis showed bilateral acetabular stress fractures.  Orthopedics consulted and recommended pain management and WBAT BLE.  Pain currently controlled on PO medication.   Hospital course pain management and ABLA.  Therapy evaluations completed and pt was recommended for CIR.     Patient's medical record from [facility] has been reviewed by the rehabilitation admission coordinator and physician.  Past Medical History  Past Medical History:  Diagnosis Date   Arthritis    "thumbs, back" (02/16/2017)   Bradycardia 02/2017   Dyspnea    with exertion   Flu 2 weeks ago   GERD (gastroesophageal reflux disease)    Hepatitis A as child   HLD (hyperlipidemia)    Persistent atrial fibrillation ([place]) 12/18/2016   Pneumonia 1999; 2018   Presence of permanent cardiac pacemaker    [facility]   Prostate cancer [facility])     Has the patient had major surgery during 100 days prior to admission? Yes  Family History   family history includes CVA in his mother; Coronary artery disease in his father and mother; Heart attack in his father; Stomach cancer in his maternal grandmother; Stroke in his mother.  Current Medications  Current Facility-Administered Medications:    0.9 %  sodium chloride infusion, 250 mL, Intravenous, Continuous, Newman Pies, MD   acetaminophen (TYLENOL) tablet 650 mg, 650 mg, Oral, Q4H  PRN, 650 mg at 02/11/21 0048 **OR** acetaminophen (TYLENOL) suppository 650 mg, 650 mg, Rectal, Q4H PRN, Newman Pies, MD   acetaminophen (TYLENOL) tablet 1,000 mg, 1,000 mg, Oral, Q6H, Newman Pies, MD, 1,000 mg at 02/11/21 1100   [START ON 02/12/2021] acetaminophen (TYLENOL) tablet 650 mg, 650 mg, Oral, Q6H, Newman Pies, MD   apixaban Arne [place]) tablet 5 mg, 5 mg, Oral, BID, Dahbura, Anton, DO, 5 mg at 02/11/21 1100   bisacodyl (DULCOLAX) suppository 10 mg, 10 mg, Rectal, Daily PRN, Newman Pies, MD   calcitonin (salmon) (MIACALCIN/FORTICAL) nasal spray 1 spray, 1 spray, Alternating Nares, Daily, Alroy Dust, RN, 1 spray at 02/11/21 1101   calcium carbonate (TUMS - dosed in mg elemental calcium) chewable tablet 400 mg of elemental calcium, 2  tablet, Oral, Daily PRN, Wells Guiles, DO   cyclobenzaprine (FLEXERIL) tablet 10 mg, 10 mg, Oral, TID PRN, Newman Pies, MD   furosemide (LASIX) tablet 20 mg, 20 mg, Oral, Daily, Wells Guiles, DO, 20 mg at 02/11/21 1100   menthol-cetylpyridinium (CEPACOL) lozenge 3 mg, 1 lozenge, Oral, PRN **OR** phenol (CHLORASEPTIC) mouth spray 1 spray, 1 spray, Mouth/Throat, PRN, Newman Pies, MD   morphine 4 MG/ML injection 4 mg, 4 mg, Intravenous, Q2H PRN, Newman Pies, MD   multivitamin with minerals tablet 1 tablet, 1 tablet, Oral, Daily, Wells Guiles, DO, 1 tablet at 02/11/21 1100   ondansetron (ZOFRAN) tablet 4 mg, 4 mg, Oral, Q6H PRN **OR** ondansetron (ZOFRAN) injection 4 mg, 4 mg, Intravenous, Q6H PRN, Newman Pies, MD   oxyCODONE (Oxy IR/ROXICODONE) immediate release tablet 10 mg, 10 mg, Oral, Q4H, Dahbura, Anton, DO, 10 mg at 02/11/21 1349   oxyCODONE (Oxy IR/ROXICODONE) immediate release tablet 10 mg, 10 mg, Oral, Q3H PRN, Newman Pies, MD   oxyCODONE (Oxy IR/ROXICODONE) immediate release tablet 5 mg, 5 mg, Oral, Q3H PRN, Newman Pies, MD   pantoprazole (PROTONIX) EC tablet 40 mg, 40 mg, Oral, Daily, Wells Guiles, DO, 40 mg at 02/11/21 1059   polyethylene glycol (MIRALAX / GLYCOLAX) packet 17 g, 17 g, Oral, Daily, Wells Guiles, DO, 17 g at 02/11/21 1059   polyvinyl alcohol (LIQUIFILM TEARS) 1.4 % ophthalmic solution 1 drop, 1 drop, Both Eyes, PRN, Heloise Purpura, RPH, 1 drop at 02/09/21 0910   rosuvastatin (CRESTOR) tablet 10 mg, 10 mg, Oral, q morning, Dahbura, Anton, DO, 10 mg at 02/11/21 1104   senna (SENOKOT) tablet 8.6 mg, 1 tablet, Oral, Daily, Wells Guiles, DO, 8.6 mg at 02/11/21 1100   sodium chloride (OCEAN) 0.65 % nasal spray 1 spray, 1 spray, Each Nare, Daily PRN, Wells Guiles, DO   sodium chloride flush (NS) 0.9 % injection 3 mL, 3 mL, Intravenous, Q12H, Newman Pies, MD, 3 mL at 02/11/21 1105   sodium chloride flush (NS) 0.9 % injection 3 mL,  3 mL, Intravenous, PRN, Newman Pies, MD   tamsulosin [facility]) capsule 0.4 mg, 0.4 mg, Oral, Daily, Wells Guiles, DO, 0.4 mg at 02/10/21 1759  Patients Current Diet:  Diet Order             Diet Heart Room service appropriate? Yes; Fluid consistency: Thin  Diet effective now                   Precautions / Restrictions Precautions Precautions: Back, Fall Precaution Booklet Issued: Yes (comment) Precaution Comments: Reinforced back precautions; Pt will tend to twist when demonstrating something, or pointing out where pain is; cues to be more aware Restrictions Weight Bearing Restrictions: No Other Position/Activity  Restrictions: Per Ortho consult, WBAT bil hips   Has the patient had 2 or more falls or a fall with injury in the past year? No  Prior Activity Level Community (5-7x/wk): very active prior to this episode.  Most recently using a rollator.  Is a partially retired Forensic psychologist.  Driving.  Prior Functional Level Self Care: Did the patient need help bathing, dressing, using the toilet or eating? Independent  Indoor Mobility: Did the patient need assistance with walking from room to room (with or without device)? Independent  Stairs: Did the patient need assistance with internal or external stairs (with or without device)? Independent  Functional Cognition: Did the patient need help planning regular tasks such as shopping or remembering to take medications? Independent  Patient Information Are you of Hispanic, Latino/a,or Spanish origin?: A. No, not of Hispanic, Latino/a, or Spanish origin What is your race?: A. White Do you need or want an interpreter to communicate with a doctor or health care staff?: 0. No  Patient's Response To:  Health Literacy and Transportation Is the patient able to respond to health literacy and transportation needs?: Yes Health Literacy - How often do you need to have someone help you when you read instructions, pamphlets, or other  written material from your doctor or pharmacy?: Never In the past 12 months, has lack of transportation kept you from medical appointments or from getting medications?: No In the past 12 months, has lack of transportation kept you from meetings, work, or from getting things needed for daily living?: No  Home Assistive Devices / [place]: Cane - single point, Rollator (4 wheels)  Prior Device Use: Indicate devices/aids used by the patient prior to current illness, exacerbation or injury?  Occasional rollator immediately prior to admission due to worsening pain  Current Functional Level Cognition  Overall Cognitive Status: Within Functional Limits for tasks assessed Orientation Level: Oriented X4    Extremity Assessment (includes Sensation/Coordination)  Upper Extremity Assessment: Overall WFL for tasks assessed  Lower Extremity Assessment: Defer to PT evaluation    ADLs  Overall ADL's : Needs assistance/impaired Eating/Feeding: Independent Grooming: Set up, Sitting Upper Body Bathing: Set up, Sitting Lower Body Bathing: Moderate assistance, Sitting/lateral leans Lower Body Bathing Details (indicate cue type and reason): educated in LB bathing leaning side to side in sitting Upper Body Dressing : Set up, Sitting Lower Body Dressing: Moderate assistance, Sit to/from stand, +2 for safety/equipment Lower Body Dressing Details (indicate cue type and reason): able to cross foot over opposite knee to don socks Toilet Transfer: Moderate assistance, +2 for physical assistance, +2 for safety/equipment, Ambulation, BSC/3in1, Rolling walker (2 wheels), Cueing for safety Toileting- Clothing Manipulation and Hygiene: Minimal assistance, Sit to/from stand Functional mobility during ADLs: Moderate assistance, Maximal assistance, +2 for physical assistance, +2 for safety/equipment, Cueing for safety, Rolling walker (2 wheels)    Mobility  Overal bed mobility: Modified Independent Bed  Mobility: Rolling, Sidelying to Sit, Sit to Sidelying Rolling: Supervision Sidelying to sit: Supervision Sit to sidelying: Supervision General bed mobility comments: Sitting EOB on arrival    Transfers  Overall transfer level: Needs assistance Equipment used: Rolling walker (2 wheels) Transfers: Sit to/from Stand Sit to Stand: Mod assist, +2 safety/equipment General transfer comment: Light mod assist to steady; very slow rise, pt dependent on UEs pushing up on RW to get to fully upright standing    Ambulation / Gait / Stairs / Wheelchair Mobility  Ambulation/Gait Ambulation/Gait assistance: Mod assist, Max assist, +2 safety/equipment Gait Distance (  Feet): 5 Feet Assistive device: Rolling walker (2 wheels) Gait Pattern/deviations: Decreased step length - right, Decreased step length - left General Gait Details: Very slow moving; Heavy dependence on UE support on RW; Extremely tense throughout upper body; Requires extensive assist to move, advance, and turn RW because he puts a lot of weight through RW to [place] hips/LEs; R UE tended to fatigue, and pt requested support at R axilla towards the end of the walk; overall very tense, and anxious with walking Gait velocity: very slow    Posture / Balance Balance Overall balance assessment: Needs assistance Sitting-balance support: No upper extremity supported, Feet supported Sitting balance-Leahy Scale: Fair Standing balance support: Bilateral upper extremity supported Standing balance-Leahy Scale: Poor Standing balance comment: Reliant on BUE support, less than upon discharge    Special needs/care consideration N/a   Previous Home Environment (from acute therapy documentation) Living Arrangements: Spouse/significant other Available Help at Discharge: Family Type of Home: House Home Layout: Multi-level, Able to live on main level with bedroom/bathroom Alternate Level Stairs-Rails: Right Alternate Level Stairs-Number of Steps: 14 Home  Access: Stairs to enter Entrance Stairs-Rails: None Entrance Stairs-Number of Steps: 2 Bathroom Shower/Tub: Multimedia programmer: Handicapped height  Discharge Living Setting Plans for Discharge Living Setting: Patient's home, Lives with (comment) (spouse) Type of Home at Discharge: House Discharge Home Layout: Multi-level, 1/2 bath on main level Alternate Level Stairs-Rails: Left, Right Alternate Level Stairs-Number of Steps: full flight Discharge Home Access: Stairs to enter Entrance Stairs-Rails: None Entrance Stairs-Number of Steps: 2 Discharge Bathroom Shower/Tub: Walk-in shower Discharge Bathroom Toilet: Handicapped height Discharge Bathroom Accessibility: Yes How Accessible: Accessible via walker Does the patient have any problems obtaining your medications?: No  Social/Family/Support Systems Patient Roles: Spouse Anticipated Caregiver: spouse Warwick Nick) Anticipated Caregiver's Contact Information: [name] [phone] Ability/Limitations of Caregiver: supervision only Caregiver Availability: 24/7 (spouse works from home 3x/week and in the office 2x/week, could potentially arrange some short term time off *if needed*) Discharge Plan Discussed with Primary Caregiver: Yes Is Caregiver In Agreement with Plan?: Yes Does Caregiver/Family have Issues with Lodging/Transportation while Pt is in Rehab?: No  Goals Patient/Family Goal for Rehab: PT/OT supervision to mod I, SLP n/a Expected length of stay: 14-16 days Pt/Family Agrees to Admission and willing to participate: Yes Program Orientation Provided & Reviewed with Pt/Caregiver Including Roles  & Responsibilities: Yes  Barriers to Discharge: Home environment access/layout  Decrease burden of Care through IP rehab admission: n/a  Possible need for SNF placement upon discharge: No  Patient Condition: I have reviewed medical records from [facility], spoken with CM, and patient. I met with patient at the bedside  for inpatient rehabilitation assessment.  Patient will benefit from ongoing PT and OT, can actively participate in 3 hours of therapy a day 5 days of the week, and can make measurable gains during the admission.  Patient will also benefit from the coordinated team approach during an Inpatient Acute Rehabilitation admission.  The patient will receive intensive therapy as well as Rehabilitation physician, nursing, social worker, and care management interventions.  Due to safety, skin/wound care, medication administration, pain management, and patient education the patient requires 24 hour a day rehabilitation nursing.  The patient is currently mod to max assist with mobility and basic ADLs.  Discharge setting and therapy post discharge at home with home health is anticipated.  Patient has agreed to participate in the Acute Inpatient Rehabilitation Program and will admit today.  Preadmission Screen Completed By:  Michel Santee,  PT, DPT 02/11/2021 2:53 PM ______________________________________________________________________   Discussed status with Dr. Ranell Patrick on 02/12/21  at 10:10 AM  and received approval for admission today.  Admission Coordinator:  Michel Santee, PT, DPT time 10:10 AM Sudie Grumbling 02/12/21    Assessment/Plan: Diagnosis: Closed fracture of L5 lumbar vertebra Does the need for close, 24 hr/day Medical supervision in concert with the patient's rehab needs make it unreasonable for this patient to be served in a less intensive setting? Yes Co-Morbidities requiring supervision/potential complications: Overweight (BMI 26.06), bilateral acetabular fractures, bradycardia, premature atrial contraction, atrial fibrillation Due to bladder management, bowel management, safety, skin/wound care, disease management, medication administration, pain management, and patient education, does the patient require 24 hr/day rehab nursing? Yes Does the patient require coordinated care of a physician, rehab nurse,  PT, OT to address physical and functional deficits in the context of the above medical diagnosis(es)? Yes Addressing deficits in the following areas: balance, endurance, locomotion, strength, transferring, bowel/bladder control, bathing, dressing, feeding, grooming, toileting, and psychosocial support Can the patient actively participate in an intensive therapy program of at least 3 hrs of therapy 5 days a week? Yes The potential for patient to make measurable gains while on inpatient rehab is excellent Anticipated functional outcomes upon discharge from inpatient rehab: supervision PT, supervision OT, independent SLP Estimated rehab length of stay to reach the above functional goals is: 2-3 weeks Anticipated discharge destination: Home 10. Overall Rehab/Functional Prognosis: excellent   MD Signature: Leeroy Cha, MD

## 2021-02-11 NOTE — Consult Note (Signed)
Reason for Consult: Acetabular stress fxs Referring Physician: Earle Gell Time called: 2951 Time at bedside: 8841   Todd Chapman is an 75 y.o. male.  HPI: Todd Chapman was admitted 4d ago with intractable back pain that was severely limiting ambulation. Workup showed an L5 compression fx and NS was consulted. He underwent kyphoplasty and, while this helped the back pain, he is still unable to walk. He locates his pain on the right side adjacent to the spine. He denies groin or hip pain. In August he was having back pain and saw Dr. Lyla Glassing; MRI was ordered that showed a left acetabular stress fx but exam and symptoms were incompatible with that as an explanation. It was thought at that time to be radicular but we had to wait for insurance approval before another MRI could be obtained. MRI obtained this admission showed bilateral stress fxs in the acetabulae and pt requested orthopedic evaluation while he was here.  Past Medical History:  Diagnosis Date   Arthritis    "thumbs, back" (02/16/2017)   Bradycardia 02/2017   Dyspnea    with exertion   Flu 2 weeks ago   GERD (gastroesophageal reflux disease)    Hepatitis A as child   HLD (hyperlipidemia)    Persistent atrial fibrillation ([place]) 12/18/2016   Pneumonia 1999; 2018   Presence of permanent cardiac pacemaker    st jude   Prostate cancer [facility])     Past Surgical History:  Procedure Laterality Date   APPENDECTOMY     age 55   CARDIOVERSION  02/16/2017   CARDIOVERSION N/A 02/16/2017   Procedure: CARDIOVERSION;  Surgeon: Thompson Grayer, MD;  Location: [facility];  Service: Cardiovascular;  Laterality: N/A;   COLONOSCOPY WITH PROPOFOL N/A 12/13/2012   Procedure: COLONOSCOPY WITH PROPOFOL;  Surgeon: Garlan Fair, MD;  Location: [facility];  Service: Endoscopy;  Laterality: N/A;   FRACTURE SURGERY     HYDROCELE EXCISION Left 05/16/2018   Procedure: HYDROCELECTOMY ADULT;  Surgeon: [name], MD;  Location: [facility];  Service: Urology;  Laterality: Left;   INSERT / REPLACE / REMOVE PACEMAKER  02/16/2017   LAPAROSCOPY N/A 06/04/2020   Procedure: POSSIBLE LAPAROTOMY POSSIBLE BOWEL RESSECTION;  Surgeon: Clovis Riley, MD;  Location: [facility];  Service: General;  Laterality: N/A;   PACEMAKER IMPLANT N/A 02/16/2017   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 His smoking use included cigarettes. He has a 0.96 pack-year smoking history. He has never used smokeless tobacco. He reports current alcohol use. He reports that he does not use drugs.  Allergies:  Allergies  Allergen Reactions   Atorvastatin Palpitations   Ezetimibe Palpitations    Medications: I have reviewed the patient's current medications.  Results for orders placed or performed during the hospital encounter of 02/06/21 (from the  past 48 hour(s))  CBC     Status: Abnormal   Collection Time: 02/09/21 11:31 AM  Result Value Ref Range   WBC 5.8 4.0 - 10.5 K/uL   RBC 3.94 (L) 4.22 - 5.81 MIL/uL   Hemoglobin 12.1 (L) 13.0 - 17.0 g/dL   HCT 37.6 (L) 39.0 - 52.0 %   MCV 95.4 80.0 - 100.0 fL   MCH 30.7 26.0 - 34.0 pg   MCHC 32.2 30.0 - 36.0 g/dL   RDW 13.8 11.5 - 15.5 %   Platelets 244 150 - 400 K/uL   nRBC 0.0 0.0 - 0.2 %    Comment: Performed at [facility], [place] [address] [place], [place] [address]  Surgical pcr screen     Status: None   Collection Time: 02/09/21  9:38 PM   Specimen:  Nasal Mucosa; Nasal Swab  Result Value Ref Range   MRSA, PCR NEGATIVE NEGATIVE   Staphylococcus aureus NEGATIVE NEGATIVE    Comment: (NOTE) The Xpert SA Assay (FDA approved for NASAL specimens in patients 10 years of age and older), is one component of a comprehensive surveillance program. It is not intended to diagnose infection nor to guide or monitor treatment. Performed at [facility], [place] [address], [place], [place] [address]   Basic metabolic panel     Status: Abnormal   Collection Time: 02/10/21  1:56 AM  Result Value Ref Range   Sodium 132 (L) 135 - 145 mmol/L   Potassium 4.4 3.5 - 5.1 mmol/L   Chloride 99 98 - 111 mmol/L   CO2 27 22 - 32 mmol/L   Glucose, Bld 130 (H) 70 - 99 mg/dL    Comment: Glucose reference range applies only to samples taken after fasting for at least 8 hours.   BUN 12 8 - 23 mg/dL   Creatinine, Ser 0.78 0.61 - 1.24 mg/dL   Calcium 10.1 8.9 - 10.3 mg/dL   GFR, Estimated >60 >60 mL/min    Comment: (NOTE) Calculated using the CKD-EPI Creatinine Equation (2021)    Anion gap 6 5 - 15    Comment: Performed at [place] [address], [place], [place] [address]  CBC     Status: Abnormal   Collection Time: 02/11/21 12:57 AM  Result Value Ref Range   WBC 5.2 4.0 - 10.5 K/uL   RBC 3.50 (L) 4.22 - 5.81 MIL/uL   Hemoglobin 10.6 (L) 13.0 - 17.0 g/dL   HCT 33.2 (L) 39.0 - 52.0 %   MCV 94.9 80.0 - 100.0 fL   MCH 30.3 26.0 - 34.0 pg   MCHC 31.9 30.0 - 36.0 g/dL   RDW 13.7 11.5 - 15.5 %   Platelets 194 150 - 400 K/uL   nRBC 0.0 0.0 - 0.2 %    Comment: Performed at [facility], [place] [address], [place], [place] [address]  Basic metabolic panel     Status: Abnormal   Collection Time: 02/11/21 12:57 AM  Result Value Ref Range   Sodium 133 (L) 135 - 145 mmol/L   Potassium 4.5 3.5 - 5.1 mmol/L   Chloride 99 98 - 111 mmol/L   CO2 26 22 - 32 mmol/L   Glucose, Bld 104 (H) 70 - 99 mg/dL    Comment: Glucose reference range applies only  to samples taken after fasting for at least 8 hours.   BUN 12 8 - 23 mg/dL   Creatinine, Ser 0.82 0.61 - 1.24 mg/dL  Calcium 10.0 8.9 - 10.3 mg/dL   GFR, Estimated >60 >60 mL/min    Comment: (NOTE) Calculated using the CKD-EPI Creatinine Equation (2021)    Anion gap 8 5 - 15    Comment: Performed at [facility], [place] [address], [place], [place] [address]    DG Lumbar Spine 2-3 Views  Result Date: 02/10/2021 CLINICAL DATA:  Kyphoplasty EXAM: LUMBAR SPINE - 2-3 VIEW COMPARISON:  MRI 02/07/2021 FINDINGS: Single low resolution intraoperative spot view of the lumbar spine. Total fluoroscopy time was 2 minutes 28 seconds. The image demonstrates vertebral augmentation at L5. IMPRESSION: Intraoperative fluoroscopic assistance provided during kyphoplasty Electronically Signed   By: Donavan Foil M.D.   On: 02/10/2021 16:15   DG C-Arm 1-60 Min-No Report  Result Date: 02/10/2021 Fluoroscopy was utilized by the requesting physician.  No radiographic interpretation.    Review of Systems  HENT:  Negative for ear discharge, ear pain, hearing loss and tinnitus.   Eyes:  Negative for photophobia and pain.  Respiratory:  Negative for cough and shortness of breath.   Cardiovascular:  Negative for chest pain.  Gastrointestinal:  Negative for abdominal pain, nausea and vomiting.  Genitourinary:  Negative for dysuria, flank pain, frequency and urgency.  Musculoskeletal:  Positive for back pain. Negative for arthralgias, myalgias and neck pain.  Neurological:  Negative for dizziness and headaches.  Hematological:  Does not bruise/bleed easily.  Psychiatric/Behavioral:  The patient is not nervous/anxious.   Blood pressure (!) 164/120, pulse 93, temperature 98.1 F (36.7 C), temperature source Oral, resp. rate 18, height 6\' 2"  (1.88 m), weight 92.1 kg, SpO2 94 %. Physical Exam Constitutional:      General: He is not in acute distress.    Appearance: He is well-developed. He is not diaphoretic.   HENT:     Head: Normocephalic and atraumatic.  Eyes:     General: No scleral icterus.       Right eye: No discharge.        Left eye: No discharge.     Conjunctiva/sclera: Conjunctivae normal.  Cardiovascular:     Rate and Rhythm: Normal rate and regular rhythm.  Pulmonary:     Effort: Pulmonary effort is normal. No respiratory distress.  Musculoskeletal:     Cervical back: Normal range of motion.     Comments: BLE No traumatic wounds, ecchymosis, or rash  Nontender, able to SLR, no pain with int/ext rotation of hips  No knee or ankle effusion  Knee stable to varus/ valgus and anterior/posterior stress  Sens DPN, SPN, TN intact  Motor EHL, ext, flex, evers 5/5  DP 2+, PT 2+, No significant edema  Skin:    General: Skin is warm and dry.  Neurological:     Mental Status: He is alert.  Psychiatric:        Mood and Affect: Mood normal.        Behavior: Behavior normal.    Assessment/Plan: Bilateral acetabula stress fxs -- These appear to be clinically insignificant. Would progress WB to tolerance. Given their presence will work up pt for metabolic bone disease. Would also recommend CIR given current limitations and prior functional status; will consult. Pt should f/u with Dr. Marcelino Scot as outpatient once discharged or at a facility.    Lisette Abu, PA-C Orthopedic Surgery [phone] 02/11/2021, 9:38 AM

## 2021-02-11 NOTE — Care Management Important Message (Signed)
Important Message  Patient Details  Name: Todd Chapman MRN: [id number] Date of Birth: January 16, 1946   Medicare Important Message Given:  Yes     Hannah Beat 02/11/2021, 1:24 PM

## 2021-02-12 ENCOUNTER — Inpatient Hospital Stay (HOSPITAL_COMMUNITY)
Admission: RE | Admit: 2021-02-12 | Discharge: 2021-02-21 | DRG: 560 | Disposition: A | Discharge status: Home / Self Care | Payer: Medicare Other | Source: Intra-hospital | Attending: Physical Medicine and Rehabilitation | Admitting: Physical Medicine and Rehabilitation

## 2021-02-12 DIAGNOSIS — J9811 Atelectasis: Secondary | ICD-10-CM | POA: Diagnosis not present

## 2021-02-12 DIAGNOSIS — Z823 Family history of stroke: Secondary | ICD-10-CM

## 2021-02-12 DIAGNOSIS — Z888 Allergy status to other drugs, medicaments and biological substances status: Secondary | ICD-10-CM | POA: Diagnosis not present

## 2021-02-12 DIAGNOSIS — R059 Cough, unspecified: Secondary | ICD-10-CM | POA: Diagnosis not present

## 2021-02-12 DIAGNOSIS — I4819 Other persistent atrial fibrillation: Secondary | ICD-10-CM | POA: Diagnosis present

## 2021-02-12 DIAGNOSIS — Z923 Personal history of irradiation: Secondary | ICD-10-CM | POA: Diagnosis not present

## 2021-02-12 DIAGNOSIS — Z950 Presence of cardiac pacemaker: Secondary | ICD-10-CM | POA: Diagnosis not present

## 2021-02-12 DIAGNOSIS — Z800 Family history of malignant neoplasm of digestive organs: Secondary | ICD-10-CM

## 2021-02-12 DIAGNOSIS — Z8546 Personal history of malignant neoplasm of prostate: Secondary | ICD-10-CM | POA: Diagnosis not present

## 2021-02-12 DIAGNOSIS — R530 Neoplastic (malignant) related fatigue: Secondary | ICD-10-CM | POA: Diagnosis present

## 2021-02-12 DIAGNOSIS — E871 Hypo-osmolality and hyponatremia: Secondary | ICD-10-CM | POA: Diagnosis not present

## 2021-02-12 DIAGNOSIS — Z8249 Family history of ischemic heart disease and other diseases of the circulatory system: Secondary | ICD-10-CM | POA: Diagnosis not present

## 2021-02-12 DIAGNOSIS — M19041 Primary osteoarthritis, right hand: Secondary | ICD-10-CM | POA: Diagnosis present

## 2021-02-12 DIAGNOSIS — G8929 Other chronic pain: Secondary | ICD-10-CM | POA: Diagnosis present

## 2021-02-12 DIAGNOSIS — Z79899 Other long term (current) drug therapy: Secondary | ICD-10-CM | POA: Diagnosis not present

## 2021-02-12 DIAGNOSIS — I4821 Permanent atrial fibrillation: Secondary | ICD-10-CM | POA: Diagnosis not present

## 2021-02-12 DIAGNOSIS — E785 Hyperlipidemia, unspecified: Secondary | ICD-10-CM | POA: Diagnosis present

## 2021-02-12 DIAGNOSIS — M19042 Primary osteoarthritis, left hand: Secondary | ICD-10-CM | POA: Diagnosis present

## 2021-02-12 DIAGNOSIS — M5137 Other intervertebral disc degeneration, lumbosacral region: Secondary | ICD-10-CM | POA: Diagnosis present

## 2021-02-12 DIAGNOSIS — M4856XD Collapsed vertebra, not elsewhere classified, lumbar region, subsequent encounter for fracture with routine healing: Principal | ICD-10-CM | POA: Diagnosis present

## 2021-02-12 DIAGNOSIS — S32050G Wedge compression fracture of fifth lumbar vertebra, subsequent encounter for fracture with delayed healing: Secondary | ICD-10-CM

## 2021-02-12 DIAGNOSIS — Z885 Allergy status to narcotic agent status: Secondary | ICD-10-CM

## 2021-02-12 DIAGNOSIS — Z7901 Long term (current) use of anticoagulants: Secondary | ICD-10-CM

## 2021-02-12 DIAGNOSIS — D649 Anemia, unspecified: Secondary | ICD-10-CM | POA: Diagnosis present

## 2021-02-12 DIAGNOSIS — S32050A Wedge compression fracture of fifth lumbar vertebra, initial encounter for closed fracture: Secondary | ICD-10-CM | POA: Diagnosis not present

## 2021-02-12 DIAGNOSIS — M84350A Stress fracture, pelvis, initial encounter for fracture: Secondary | ICD-10-CM | POA: Diagnosis present

## 2021-02-12 DIAGNOSIS — Z87891 Personal history of nicotine dependence: Secondary | ICD-10-CM

## 2021-02-12 DIAGNOSIS — I10 Essential (primary) hypertension: Secondary | ICD-10-CM | POA: Diagnosis present

## 2021-02-12 DIAGNOSIS — K219 Gastro-esophageal reflux disease without esophagitis: Secondary | ICD-10-CM | POA: Diagnosis present

## 2021-02-12 DIAGNOSIS — M479 Spondylosis, unspecified: Secondary | ICD-10-CM | POA: Diagnosis present

## 2021-02-12 DIAGNOSIS — M4850XA Collapsed vertebra, not elsewhere classified, site unspecified, initial encounter for fracture: Secondary | ICD-10-CM | POA: Diagnosis present

## 2021-02-12 LAB — PREALBUMIN: Prealbumin: 15.6 mg/dL — ABNORMAL LOW (ref 18–38)

## 2021-02-12 LAB — COMPREHENSIVE METABOLIC PANEL
ALT: 14 U/L (ref 0–44)
AST: 21 U/L (ref 15–41)
Albumin: 3 g/dL — ABNORMAL LOW (ref 3.5–5.0)
Alkaline Phosphatase: 84 U/L (ref 38–126)
Anion gap: 9 (ref 5–15)
BUN: 16 mg/dL (ref 8–23)
CO2: 24 mmol/L (ref 22–32)
Calcium: 10.1 mg/dL (ref 8.9–10.3)
Chloride: 98 mmol/L (ref 98–111)
Creatinine, Ser: 0.8 mg/dL (ref 0.61–1.24)
GFR, Estimated: >60 mL/min (ref >60)
Glucose, Bld: 115 mg/dL — ABNORMAL HIGH (ref 70–99)
Potassium: 4.2 mmol/L (ref 3.5–5.1)
Sodium: 131 mmol/L — ABNORMAL LOW (ref 135–145)
Total Bilirubin: 1 mg/dL (ref 0.3–1.2)
Total Protein: 5.8 g/dL — ABNORMAL LOW (ref 6.5–8.1)

## 2021-02-12 LAB — TSH: TSH: 1.69 u[IU]/mL (ref 0.350–4.500)

## 2021-02-12 LAB — MAGNESIUM: Magnesium: 1.7 mg/dL (ref 1.7–2.4)

## 2021-02-12 LAB — PHOSPHORUS: Phosphorus: 3.4 mg/dL (ref 2.5–4.6)

## 2021-02-12 LAB — VITAMIN D 25 HYDROXY (VIT D DEFICIENCY, FRACTURES): Vit D, 25-Hydroxy: 46.88 ng/mL (ref 30–100)

## 2021-02-12 LAB — SURGICAL PATHOLOGY

## 2021-02-12 MED ORDER — ACETAMINOPHEN 650 MG RE SUPP: 650.0000 mg | RECTAL | Status: DC | PRN

## 2021-02-12 MED ORDER — OXYCODONE HCL 5 MG PO TABS
5.0000 mg | ORAL_TABLET | ORAL | Status: DC | PRN
  Administered 2021-02-14 – 2021-02-21 (×14): 5 mg via ORAL
  Filled 2021-02-12 (×13): qty 1

## 2021-02-12 MED ORDER — SENNA 8.6 MG PO TABS
1.0000 | ORAL_TABLET | Freq: Every day | ORAL | Status: DC
  Administered 2021-02-13 – 2021-02-21 (×9): 8.6 mg via ORAL
  Filled 2021-02-12 (×9): qty 1

## 2021-02-12 MED ORDER — BISACODYL 10 MG RE SUPP: 10.0000 mg | Freq: Every day | RECTAL | Status: DC | PRN

## 2021-02-12 MED ORDER — APIXABAN 5 MG PO TABS
5.0000 mg | ORAL_TABLET | Freq: Two times a day (BID) | ORAL | Status: DC
  Administered 2021-02-13 – 2021-02-21 (×17): 5 mg via ORAL
  Filled 2021-02-12 (×17): qty 1

## 2021-02-12 MED ORDER — POLYETHYLENE GLYCOL 3350 17 G PO PACK: 17.0000 g | PACK | Freq: Every day | ORAL | 0 refills | Status: DC

## 2021-02-12 MED ORDER — CALCITONIN (SALMON) 200 UNIT/ACT NA SOLN
1.0000 | Freq: Every day | NASAL | Status: DC
  Administered 2021-02-14 – 2021-02-21 (×8): 1 via NASAL
  Filled 2021-02-12: qty 3.7

## 2021-02-12 MED ORDER — OXYCODONE HCL 10 MG PO TABS: 10.0000 mg | ORAL_TABLET | ORAL | 0 refills | Status: DC

## 2021-02-12 MED ORDER — CALCIUM CARBONATE ANTACID 500 MG PO CHEW: 2.0000 | CHEWABLE_TABLET | Freq: Every day | ORAL | Status: DC | PRN

## 2021-02-12 MED ORDER — CALCITONIN (SALMON) 200 UNIT/ACT NA SOLN: 1.0000 | Freq: Every day | NASAL | 12 refills | Status: DC

## 2021-02-12 MED ORDER — POLYVINYL ALCOHOL 1.4 % OP SOLN: 1.0000 [drp] | OPHTHALMIC | Status: DC | PRN

## 2021-02-12 MED ORDER — ADULT MULTIVITAMIN W/MINERALS CH
1.0000 | ORAL_TABLET | Freq: Every day | ORAL | Status: DC
  Administered 2021-02-13 – 2021-02-21 (×9): 1 via ORAL
  Filled 2021-02-12 (×9): qty 1

## 2021-02-12 MED ORDER — CYCLOBENZAPRINE HCL 10 MG PO TABS
10.0000 mg | ORAL_TABLET | Freq: Three times a day (TID) | ORAL | Status: DC | PRN
  Administered 2021-02-13 – 2021-02-15 (×3): 10 mg via ORAL
  Filled 2021-02-12 (×4): qty 1

## 2021-02-12 MED ORDER — ONDANSETRON HCL 4 MG PO TABS: 4.0000 mg | ORAL_TABLET | Freq: Four times a day (QID) | ORAL | Status: DC | PRN

## 2021-02-12 MED ORDER — PANTOPRAZOLE SODIUM 40 MG PO TBEC
40.0000 mg | DELAYED_RELEASE_TABLET | Freq: Every day | ORAL | Status: DC
  Administered 2021-02-13 – 2021-02-21 (×9): 40 mg via ORAL
  Filled 2021-02-12 (×9): qty 1

## 2021-02-12 MED ORDER — MENTHOL 3 MG MT LOZG: 1.0000 | LOZENGE | OROMUCOSAL | Status: DC | PRN

## 2021-02-12 MED ORDER — OXYCODONE HCL 5 MG PO TABS
10.0000 mg | ORAL_TABLET | ORAL | Status: DC | PRN
  Administered 2021-02-13 – 2021-02-17 (×8): 10 mg via ORAL
  Filled 2021-02-12 (×11): qty 2

## 2021-02-12 MED ORDER — POLYETHYLENE GLYCOL 3350 17 G PO PACK
17.0000 g | PACK | Freq: Every day | ORAL | Status: DC
  Administered 2021-02-13 – 2021-02-21 (×9): 17 g via ORAL
  Filled 2021-02-12 (×9): qty 1

## 2021-02-12 MED ORDER — FUROSEMIDE 20 MG PO TABS
20.0000 mg | ORAL_TABLET | Freq: Every day | ORAL | Status: DC
  Administered 2021-02-13 – 2021-02-21 (×9): 20 mg via ORAL
  Filled 2021-02-12 (×9): qty 1

## 2021-02-12 MED ORDER — OXYCODONE HCL 5 MG PO TABS
10.0000 mg | ORAL_TABLET | ORAL | Status: DC
  Administered 2021-02-13 (×2): 10 mg via ORAL
  Filled 2021-02-12 (×2): qty 2

## 2021-02-12 MED ORDER — ACETAMINOPHEN 325 MG PO TABS: 650.0000 mg | ORAL_TABLET | ORAL | Status: DC | PRN

## 2021-02-12 MED ORDER — ROSUVASTATIN CALCIUM 5 MG PO TABS
10.0000 mg | ORAL_TABLET | Freq: Every morning | ORAL | Status: DC
  Administered 2021-02-13 – 2021-02-20 (×8): 10 mg via ORAL
  Filled 2021-02-12 (×8): qty 2

## 2021-02-12 MED ORDER — ACETAMINOPHEN 325 MG PO TABS
650.0000 mg | ORAL_TABLET | Freq: Four times a day (QID) | ORAL | Status: DC
  Administered 2021-02-13 – 2021-02-21 (×34): 650 mg via ORAL
  Filled 2021-02-12 (×35): qty 2

## 2021-02-12 MED ORDER — SALINE SPRAY 0.65 % NA SOLN: 1.0000 | Freq: Every day | NASAL | Status: DC | PRN

## 2021-02-12 MED ORDER — TAMSULOSIN HCL 0.4 MG PO CAPS
0.4000 mg | ORAL_CAPSULE | Freq: Every day | ORAL | Status: DC
  Administered 2021-02-13 – 2021-02-20 (×8): 0.4 mg via ORAL
  Filled 2021-02-12 (×8): qty 1

## 2021-02-12 MED ORDER — ONDANSETRON HCL 4 MG/2ML IJ SOLN: 4.0000 mg | Freq: Four times a day (QID) | INTRAMUSCULAR | Status: DC | PRN

## 2021-02-12 MED ORDER — PHENOL 1.4 % MT LIQD: 1.0000 | OROMUCOSAL | Status: DC | PRN

## 2021-02-12 NOTE — Progress Notes (Signed)
Physical Therapy Treatment Patient Details Name: Todd Chapman MRN: [id number] DOB: 11-30-1945 Today's Date: 02/12/2021   History of Present Illness Pt is a 75 year old man admitted on 02/06/21 with worsening back pain. 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 MRI confirmed L5 fx and B acetabular stress fxs to be managed with outpatient orthopedist. PMH: afib on Eliquis, pacemaker, HLD, SBO, prostate ca s/p radiation.    PT Comments    Continuing work on functional mobility and activity tolerance;  Pt was pleased to report that the plan is for him to go to Acute Inpatient Rehab here at [facility]; Overall he was less tense today, and needed less assist to maneuver RW and take steps   Recommendations for follow up therapy are one component of a multi-disciplinary discharge planning process, led by the attending physician.  Recommendations may be updated based on patient status, additional functional criteria and insurance authorization.  Follow Up Recommendations  Acute inpatient rehab (3hours/day)     Assistance Recommended at Discharge Frequent or constant Supervision/Assistance  Equipment Recommendations  Rolling walker (2 wheels);BSC/3in1;Hospital bed    Recommendations for Other Services Rehab consult;OT consult (as ordered)     Precautions / Restrictions Precautions Precautions: Back;Fall Precaution Booklet Issued: Yes (comment) Precaution Comments: Reinforced back precautions; Pt will tend to twist when demonstrating something, or pointing out where pain is; cues to be more aware Restrictions Other Position/Activity Restrictions: Per Ortho consult, WBAT bil hips     Mobility  Bed Mobility               General bed mobility comments: Sitting EOB on arrival    Transfers Overall transfer level: Needs assistance Equipment used: Rolling walker (2 wheels) Transfers: Sit to/from Stand Sit to Stand: Mod assist            General transfer comment: Light mod assist to steady; very slow rise, pt dependent on UEs pushing up on RW to get to fully upright standing    Ambulation/Gait Ambulation/Gait assistance: Mod assist Gait Distance (Feet): 9 Feet (6+3) Assistive device: Rolling walker (2 wheels) Gait Pattern/deviations: Decreased step length - right;Decreased step length - left       General Gait Details: Employed step-by-step cues for gait today ("RW, R step, L step"), and that helped with pt doing more to advance the RW; very short steps, and still painful with standing, but less stiff, tense, and anxious than yesterday's session   Stairs             Wheelchair Mobility    Modified Rankin (Stroke Patients Only)       Balance     Sitting balance-Leahy Scale: Fair       Standing balance-Leahy Scale: Poor                              Cognition Arousal/Alertness: Awake/alert Behavior During Therapy: WFL for tasks assessed/performed Overall Cognitive Status: Within Functional Limits for tasks assessed                                          Exercises      General Comments General comments (skin integrity, edema, etc.): Pt reports he is pleased to be able to go to rehab      Pertinent Vitals/Pain Pain Assessment: Faces Faces Pain  Scale: Hurts even more Pain Location: back and bil hips, R hip hurts worse than L Pain Descriptors / Indicators: Grimacing;Guarding Pain Intervention(s): Monitored during session;Premedicated before session    Home Living                          Prior Function            PT Goals (current goals can now be found in the care plan section) Acute Rehab PT Goals Patient Stated Goal: to decrease pain and be active again; Wants to get to Acute Inpt Rehab PT Goal Formulation: With patient Time For Goal Achievement: 02/25/21 Potential to Achieve Goals: Good Progress towards PT goals: Progressing toward  goals    Frequency    Min 4X/week      PT Plan Current plan remains appropriate    Co-evaluation              AM-PAC PT "6 Clicks" Mobility   Outcome Measure  Help needed turning from your back to your side while in a flat bed without using bedrails?: A Little Help needed moving from lying on your back to sitting on the side of a flat bed without using bedrails?: A Little Help needed moving to and from a bed to a chair (including a wheelchair)?: A Lot Help needed standing up from a chair using your arms (e.g., wheelchair or bedside chair)?: A Lot Help needed to walk in hospital room?: A Lot Help needed climbing 3-5 steps with a railing? : Total 6 Click Score: 13    End of Session Equipment Utilized During Treatment: Back brace Activity Tolerance: Other (comment) (Still participating well, despite significant pain) Patient left: in chair;with call bell/phone within reach Nurse Communication: Mobility status PT Visit Diagnosis: Pain;Difficulty in walking, not elsewhere classified (R26.2) Pain - Right/Left:  (bilateral hips and back) Pain - part of body: Hip (Bilateral hips and back)     Time: 1610-9604 PT Time Calculation (min) (ACUTE ONLY): 32 min  Charges:  $Gait Training: 23-37 mins                     Roney Marion, PT  Acute Rehabilitation Services Pager [phone] Office [phone]    Colletta Maryland 02/12/2021, 2:05 PM

## 2021-02-12 NOTE — Progress Notes (Signed)
Inpatient Rehab Admissions Coordinator:   I have a bed for pt to admit to CIR today.  FMTS in agreement.  I will let pt/family and TOC team know.   Shann Medal, PT, DPT Admissions Coordinator [phone] 02/12/21  9:47 AM

## 2021-02-12 NOTE — Discharge Summary (Addendum)
Todd Chapman Discharge Summary  Patient name: Todd Chapman Medical record number: [id number] Date of birth: 1946-01-18 Age: 75 y.o. Gender: male Date of Admission: 02/06/2021  Date of Discharge: 02/12/21 Admitting Physician: Wells Guiles, DO  Primary Care Provider: [name], MD Consultants: Neurosurgery, orthopedics  Indication for Hospitalization: Worsening back pain  Discharge Diagnoses/Problem List:  Principal Problem:   Closed compression fracture of L5 lumbar vertebra, initial encounter [facility]) Active Problems:   Back pain   Bilateral acetabular fractures, closed, initial encounter ([place])    Disposition: CIR  Discharge Condition: Stable  Discharge Exam:  Blood pressure (!) 142/79, pulse 69, temperature 98 F (36.7 C), temperature source Oral, resp. rate 16, height 6\' 2"  (1.88 m), weight 92.1 kg, SpO2 100 %.  Gen: WDWN, NAD CV: irregularly irregular, no murmurs auscultated Pulm: CTAB, no increased work of breathing Extremities: no pitting edema bilaterally  Brief Chapman Course:  Todd Chapman is a 75 y.o.male with a history of A. fib on Eliquis with pacemaker, HLD, GERD, SBO, prostate cancer status postradiation, aspiration pneumonia who was admitted to the family practice teaching Service at [facility] for back pain with concern for malignant process. His Chapman course is detailed below:  L5 compression fracture  bilateral acetabular stress fractures Presented with worsening back pain, isolated T102.2 and single emesis episode. WBC 15.0. Failed prednisone taper provided by PCP. Physical exam non-concerning for neurological involvement. Of note, MRI of left hip on 11/04/2020 noted severe signal abnormality in the left superior acetabulum.  Differential considerations included severe stress reaction possibly developing insufficiency fracture versus underlying malignancy secondary to metastatic disease given patient's history of prostate cancer.   It was recommended that a follow-up MRI of the pelvis with and without IV contrast in 3 months. CT lumbar spine noted compression fracture of L5, new since March 2022. CT abdomen/pelvis supported compression deformity of L5 vertebral body. MR L spine and pelvis showed L5 compression fracture, bilateral acetabular stress fractures.  Orthopedics advised patient could follow-up in the outpatient setting for acetabular stress fractures.  Patient received kyphoplasty with neurosurgery for L5 compression fracture.  Patient was discharged to CIR.  Other chronic conditions were medically managed with home medications and formulary alternatives as necessary (HLD, GERD)  PCP Follow-up Recommendations: DEXA scan Consider bisphosphonate therapy  Significant Procedures: Kyphoplasty  Significant Labs and Imaging:  Recent Labs  Lab 02/08/21 0331 02/09/21 1131 02/11/21 0057  WBC 7.6 5.8 5.2  HGB 9.7* 12.1* 10.6*  HCT 29.9* 37.6* 33.2*  PLT 191 244 194   Recent Labs  Lab 02/06/21 1228 02/08/21 0331 02/10/21 0156 02/11/21 0057 02/12/21 0213  NA 137  --  132* 133* 131*  K 3.6  --  4.4 4.5 4.2  CL 103  --  99 99 98  CO2 25  --  27 26 24   GLUCOSE 108*  --  130* 104* 115*  BUN 15  --  12 12 16   CREATININE 0.77  --  0.78 0.82 0.80  CALCIUM 10.1  --  10.1 10.0 10.1  MG  --   --   --   --  1.7  PHOS  --   --   --   --  3.4  ALKPHOS 91 76  --   --  84  AST 26 24  --   --  21  ALT 24 22  --   --  14  ALBUMIN 3.7 2.9*  --   --  3.0*   Results/Tests  Pending at Time of Discharge: Metabolic bone disease labs  Discharge Medications:  Allergies as of 02/12/2021       Reactions   Atorvastatin Palpitations   Ezetimibe Palpitations        Medication List     STOP taking these medications    Eye Vitamins Caps   lidocaine 5 % Commonly known as: LIDODERM   oxyCODONE-acetaminophen 5-325 MG tablet Commonly known as: PERCOCET/ROXICET       TAKE these medications    acetaminophen 325 MG  tablet Commonly known as: TYLENOL Take 2 tablets (650 mg total) by mouth every 4 (four) hours as needed for mild pain ((score 1 to 3) or temp > 100.5). What changed:  medication strength how much to take when to take this reasons to take this   calcitonin (salmon) 200 UNIT/ACT nasal spray Commonly known as: MIACALCIN/FORTICAL Place 1 spray into alternate nostrils daily.   calcium carbonate 500 MG chewable tablet Commonly known as: TUMS - dosed in mg elemental calcium Chew 2 tablets by mouth daily as needed for indigestion or heartburn.   Eliquis 5 MG Tabs tablet Generic drug: apixaban TAKE 1 TABLET(5 MG) BY MOUTH TWICE DAILY   furosemide 20 MG tablet Commonly known as: LASIX Take 20 mg by mouth daily.   multivitamin with minerals Tabs tablet Take 1 tablet by mouth daily.   Oxycodone HCl 10 MG Tabs Take 1 tablet (10 mg total) by mouth every 4 (four) hours.   pantoprazole 40 MG tablet Commonly known as: PROTONIX Take 40 mg daily by mouth.   polyethylene glycol 17 g packet Commonly known as: MIRALAX / GLYCOLAX Take 17 g by mouth daily. Start taking on: February 13, 2021   rosuvastatin 10 MG tablet Commonly known as: CRESTOR Take 10 mg by mouth every morning.   sodium chloride 0.65 % Soln nasal spray Commonly known as: OCEAN Place 1 spray into both nostrils daily as needed for congestion.   SYSTANE BALANCE OP Place 1 drop into both eyes daily.   tamsulosin 0.4 MG Caps capsule Commonly known as: FLOMAX TAKE 1 CAPSULE(0.4 MG) BY MOUTH DAILY AFTER AND SUPPER   trolamine salicylate 10 % cream Commonly known as: ASPERCREME Apply 1 application topically daily.        Discharge Instructions: Please refer to Patient Instructions section of EMR for full details.  Patient was counseled important signs and symptoms that should prompt return to medical care, changes in medications, dietary instructions, activity restrictions, and follow up appointments.   Follow-Up  Appointments: Future Appointments  Date Time Provider [place]  05/09/2021  3:00 PM [facility] DEVICE REMOTES [facility]  08/08/2021  3:00 PM [facility] DEVICE REMOTES [facility]  11/07/2021  3:00 PM [facility] DEVICE REMOTES [facility]  02/06/2022  3:00 PM [facility] DEVICE REMOTES [facility]    Wells Guiles, DO 02/12/2021, 10:33 AM PGY-1, [place]

## 2021-02-12 NOTE — Progress Notes (Signed)
   Providing Compassionate, Quality Care - Together   Subjective: Patient reports no issues overnight. He is looking forward to CIR.  Objective: Vital signs in last 24 hours: Temp:  [97.6 F (36.4 C)-99.8 F (37.7 C)] 98 F (36.7 C) (12/07 0742) Pulse Rate:  [69-92] 69 (12/07 0742) Resp:  [16-18] 16 (12/07 0742) BP: (103-145)/(71-93) 142/79 (12/07 0742) SpO2:  [96 %-100 %] 100 % (12/07 0742)  Intake/Output from previous day: 12/06 0701 - 12/07 0700 In: 300 [P.O.:300] Out: 1050 [Urine:1050] Intake/Output this shift: No intake/output data recorded.  Alert and oriented x 4 PERRLA CN II-XII grossly intact MAE, Strength and sensation intact Incision is covered with Honeycomb dressing and Steri Strips; Dressing is clean, dry, and intact   Lab Results: Recent Labs    02/11/21 0057  WBC 5.2  HGB 10.6*  HCT 33.2*  PLT 194   BMET Recent Labs    02/11/21 0057 02/12/21 0213  NA 133* 131*  K 4.5 4.2  CL 99 98  CO2 26 24  GLUCOSE 104* 115*  BUN 12 16  CREATININE 0.82 0.80  CALCIUM 10.0 10.1    Studies/Results: DG Lumbar Spine 2-3 Views  Result Date: 02/10/2021 CLINICAL DATA:  Kyphoplasty EXAM: LUMBAR SPINE - 2-3 VIEW COMPARISON:  MRI 02/07/2021 FINDINGS: Single low resolution intraoperative spot view of the lumbar spine. Total fluoroscopy time was 2 minutes 28 seconds. The image demonstrates vertebral augmentation at L5. IMPRESSION: Intraoperative fluoroscopic assistance provided during kyphoplasty Electronically Signed   By: Donavan Foil M.D.   On: 02/10/2021 16:15   DG C-Arm 1-60 Min-No Report  Result Date: 02/10/2021 Fluoroscopy was utilized by the requesting physician.  No radiographic interpretation.    Assessment/Plan: Mr. Kienast underwent L5 kyphoplasty with bone biopsies by Dr. Arnoldo Morale on 02/10/2021. He is slowly improving.   LOS: 4 days   -Discharge to CIR later today -Follow up with Dr. Arnoldo Morale as outpatient once discharged from [place], [place], AGNP-C Nurse Practitioner  [facility]. [address] [place], [place], [place] [address] P: [phone]    F: [phone]  02/12/2021, 10:30 AM

## 2021-02-12 NOTE — H&P (Signed)
Physical Medicine and Rehabilitation Admission H&P    Chief Complaint  Patient presents with   Back Pain  : HPI: Todd Chapman is a 75 year old male who initially presented to the emergency department with acute onset of mid lumbar back pain on 02/06/2021.  A new inferior endplate compression deformity involving L5 vertebral body was noted.  No cauda equina syndrome noted.  Family medicine was contacted for admission.  Neurosurgery was consulted. He underwent L5 kyphoplasty with vertebral body biopsies by Dr. Arnoldo Morale on February 10, 2021.  Because of his persistent low back pain, orthopedic surgery was consulted.  MRI of the pelvis was performed and revealed bilateral acetabular/supra acetabular stress fractures in the pelvis.  These findings were not felt to be clinically significant or would preclude progression to weightbearing.  Metabolic bone disease work-up in progress. He will need inpatient rehab therapies for decreased functional mobility due to acute on chronic back pain from compression and stress fractures.  He is interviewed on the orthopedic floor where he is out of bed to chair complaining of mild right lower back pain.  He states he is able to ambulate with rolling walker but cannot balance himself without support.  He is tolerating his diet and voiding spontaneously.  Local history significant for prostate cancer status post radiation therapy treatment and hormone suppressive therapy.  No known metastasis. Prior history of right elbow fracture and left wrist fracture. Past surgical history significant for diagnostic laparoscopy with lysis of adhesive band on 06/04/2020 secondary to small bowel obstruction. Past medical history is significant for persistent atrial fibrillation on chronic apixaban therapy.  Status post pacemaker placement.  History of dyslipidemia.  He is not diabetic.  No history of coronary artery disease.  He lives with his wife in a 3 story town home. 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   Next of kin: wife, Todd Chapman  Currently received the good news by phone that his L5 biopsy shows no cancer!  Review of Systems  Respiratory:  Negative for shortness of breath.   Gastrointestinal:  Negative for nausea and vomiting.  Musculoskeletal:  Positive for back pain.  Past Medical History:  Diagnosis Date   Arthritis    "thumbs, back" (02/16/2017)   Bradycardia 02/2017   Dyspnea    with exertion   Flu 2 weeks ago   GERD (gastroesophageal reflux disease)    Hepatitis A as child   HLD (hyperlipidemia)    Persistent atrial fibrillation ([place]) 12/18/2016   Pneumonia 1999; 2018   Presence of permanent cardiac pacemaker    st jude   Prostate cancer [facility])    Past Surgical History:  Procedure Laterality Date   APPENDECTOMY     age 49   CARDIOVERSION  02/16/2017   CARDIOVERSION N/A 02/16/2017   Procedure: CARDIOVERSION;  Surgeon: Thompson Grayer, MD;  Location: [facility];  Service: Cardiovascular;  Laterality: N/A;   COLONOSCOPY WITH PROPOFOL N/A 12/13/2012   Procedure: COLONOSCOPY WITH PROPOFOL;  Surgeon: Garlan Fair, MD;  Location: [facility];  Service: Endoscopy;  Laterality: N/A;   FRACTURE SURGERY     HYDROCELE EXCISION Left 05/16/2018   Procedure: HYDROCELECTOMY ADULT;  Surgeon: [name], MD;  Location: [facility];  Service: Urology;  Laterality: Left;   INSERT / REPLACE / REMOVE PACEMAKER  02/16/2017   KYPHOPLASTY N/A 02/10/2021   Procedure: KYPHOPLASTY L-5;  Surgeon: Newman Pies, MD;  Location: [place];  Service:  Neurosurgery;  Laterality: N/A;   LAPAROSCOPY N/A 06/04/2020   Procedure: POSSIBLE LAPAROTOMY POSSIBLE BOWEL RESSECTION;  Surgeon: Clovis Riley, MD;  Location: [facility];  Service: General;  Laterality: N/A;   PACEMAKER IMPLANT N/A 02/16/2017   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 His smoking use included cigarettes. He has a 0.96 pack-year smoking history. He has never used smokeless tobacco. He reports current alcohol use. He reports that he does not use drugs. Allergies:  Allergies  Allergen Reactions   Atorvastatin Palpitations   Ezetimibe Palpitations   Medications Prior to Admission  Medication Sig Dispense Refill   acetaminophen (TYLENOL) 500 MG tablet Take 500 mg by mouth daily as needed for mild pain.     calcium carbonate (TUMS - DOSED IN MG ELEMENTAL CALCIUM) 500 MG chewable tablet Chew 2 tablets by mouth daily as needed for indigestion or heartburn.     ELIQUIS 5 MG TABS tablet TAKE 1 TABLET(5 MG) BY MOUTH TWICE DAILY 180 tablet 1   furosemide (LASIX) 20 MG tablet Take 20 mg by mouth daily.     lidocaine (LIDODERM) 5 % 1 patch daily as needed (pain).     Multiple Vitamin (MULTIVITAMIN WITH MINERALS) TABS tablet Take 1 tablet by mouth daily.     Multiple Vitamins-Minerals (EYE VITAMINS) CAPS Take 1 capsule by mouth daily.      oxyCODONE-acetaminophen (PERCOCET/ROXICET) 5-325 MG tablet Take 1 tablet by mouth every 6 (six) hours as needed for severe pain.     pantoprazole (PROTONIX) 40 MG tablet Take 40 mg daily by mouth.  2   Propylene Glycol (SYSTANE BALANCE OP) Place 1 drop into both eyes daily.     rosuvastatin (CRESTOR) 10 MG tablet Take 10 mg by mouth every morning.     sodium chloride (OCEAN) 0.65 % SOLN nasal spray Place 1 spray into both nostrils daily as needed for congestion.     tamsulosin  (FLOMAX) 0.4 MG CAPS capsule TAKE 1 CAPSULE(0.4 MG) BY MOUTH DAILY AFTER AND SUPPER 30 capsule 5   trolamine salicylate (ASPERCREME) 10 % cream Apply 1 application topically daily.      Drug Regimen Review  Drug regimen was reviewed and remains appropriate with no significant issues identified  Home: Home Living Family/patient expects to be discharged to:: Private residence Living Arrangements: Spouse/significant other Available Help at Discharge: Family Type of Home: House Home Access: Stairs to enter Technical brewer of Steps: 2 Entrance Stairs-Rails: None Home Layout: Multi-level, Able to live on main level with bedroom/bathroom Alternate Level Stairs-Number of Steps: 14 Alternate Level Stairs-Rails: Right Bathroom Shower/Tub: Multimedia programmer: Handicapped height Home Equipment: [place] - single point, Rollator (4 wheels)   Functional History: Prior Function Prior Level of Function : Independent/Modified Independent Mobility Comments: Has been using cane for ambulation ADLs Comments: independent  Functional Status:  Mobility: Bed Mobility Overal bed mobility: Modified Independent Bed Mobility: Rolling, Sidelying to Sit, Sit to Sidelying Rolling: Supervision Sidelying to sit: Supervision Sit to sidelying: Supervision General bed mobility comments: Sitting EOB on arrival Transfers Overall transfer level: Needs assistance Equipment used: Rolling walker (2 wheels) Transfers: Sit to/from Stand Sit to Stand: Mod assist General transfer comment: Light mod assist to steady; very slow rise, pt dependent on UEs pushing up on RW to get to fully upright standing Ambulation/Gait Ambulation/Gait assistance: Mod assist Gait Distance (Feet): 9 Feet (6+3) Assistive device: Rolling walker (2 wheels) Gait Pattern/deviations: Decreased step length - right, Decreased step length - left General Gait Details: Employed step-by-step cues for gait today ("RW, R step, L  step"), and that helped with pt doing more to advance the RW; very short steps, and still painful with standing, but less stiff, tense, and anxious than yesterday's session Gait velocity: very slow    ADL: ADL Overall ADL's : Needs assistance/impaired Eating/Feeding: Independent Grooming: Set up, Sitting Upper Body Bathing: Set up, Sitting Lower Body Bathing: Moderate assistance, Sitting/lateral leans Lower Body Bathing Details (indicate cue type and reason): educated in LB bathing leaning side to side in sitting Upper Body Dressing : Set up, Sitting Lower Body Dressing: Moderate assistance, Sit to/from stand, +2 for safety/equipment Lower Body Dressing Details (indicate cue type and reason): able to cross foot over opposite knee to don socks Toilet Transfer: Moderate assistance, +2 for physical assistance, +2 for safety/equipment, Ambulation, BSC/3in1, Rolling walker (2 wheels), Cueing for safety Toileting- Clothing Manipulation and Hygiene: Minimal assistance, Sit to/from stand Functional mobility during ADLs: Moderate assistance, Maximal assistance, +2 for physical assistance, +2 for safety/equipment, Cueing for safety, Rolling walker (2 wheels)  Cognition: Cognition Overall Cognitive Status: Within Functional Limits for tasks assessed Orientation Level: Oriented X4 Cognition Arousal/Alertness: Awake/alert Behavior During Therapy: WFL for tasks assessed/performed Overall Cognitive Status: Within Functional Limits for tasks assessed  Physical Exam: Blood pressure (!) 157/112, pulse 86, temperature 98 F (36.7 C), temperature source Oral, resp. rate 16, height 6\' 2"  (1.88 m), weight 92.1 kg, SpO2 95 %. Physical Exam Gen: no distress, normal appearing HEENT: oral mucosa pink and moist, NCAT Cardio: Reg rate Chest: normal effort, normal rate of breathing Abd: soft, non-distended Ext: no edema Psych: pleasant, normal affect, personable and pleasant Skin: incision with honeycomb  dressing and steristrips- c/D/i Neuro: CN 2-12 intact. Alert and oriented x4.  Musculoskeletal: 5/5 strength, sensation intact.  Results for orders placed or performed during the hospital encounter of 02/06/21 (from the past 48 hour(s))  CBC     Status: Abnormal   Collection Time: 02/11/21 12:57 AM  Result Value Ref Range   WBC 5.2 4.0 - 10.5 K/uL   RBC 3.50 (L) 4.22 - 5.81 MIL/uL   Hemoglobin 10.6 (L) 13.0 - 17.0 g/dL   HCT 33.2 (L) 39.0 - 52.0 %   MCV 94.9 80.0 - 100.0 fL   MCH 30.3 26.0 - 34.0 pg   MCHC 31.9 30.0 - 36.0 g/dL   RDW 13.7 11.5 - 15.5 %   Platelets 194 150 - 400 K/uL   nRBC 0.0 0.0 - 0.2 %    Comment: Performed at [facility], [place] [address] [place], [place] [address]  Basic metabolic panel     Status: Abnormal   Collection Time: 02/11/21 12:57 AM  Result Value Ref Range   Sodium 133 (L) 135 - 145 mmol/L   Potassium 4.5 3.5 - 5.1 mmol/L   Chloride  99 98 - 111 mmol/L   CO2 26 22 - 32 mmol/L   Glucose, Bld 104 (H) 70 - 99 mg/dL    Comment: Glucose reference range applies only to samples taken after fasting for at least 8 hours.   BUN 12 8 - 23 mg/dL   Creatinine, Ser 0.82 0.61 - 1.24 mg/dL   Calcium 10.0 8.9 - 10.3 mg/dL   GFR, Estimated >60 >60 mL/min    Comment: (NOTE) Calculated using the CKD-EPI Creatinine Equation (2021)    Anion gap 8 5 - 15    Comment: Performed at [facility] [address] [place], [place] [address]  TSH     Status: None   Collection Time: 02/12/21  2:13 AM  Result Value Ref Range   TSH 1.690 0.350 - 4.500 uIU/mL    Comment: Performed by a 3rd Generation assay with a functional sensitivity of <=0.01 uIU/mL. Performed at [facility], [place] [address], [place], [place] [address]   VITAMIN D 25 Hydroxy (Vit-D Deficiency, Fractures)     Status: None   Collection Time: 02/12/21  2:13 AM  Result Value Ref Range   Vit D, 25-Hydroxy 46.88 30 - 100 ng/mL    Comment: (NOTE) Vitamin D deficiency has been defined by  the Institute of Medicine  and an Endocrine Society practice guideline as a level of serum 25-OH  vitamin D less than 20 ng/mL (1,2). The Endocrine Society went on to  further define vitamin D insufficiency as a level between 21 and 29  ng/mL (2).  1. IOM (Institute of Medicine). 2010. Dietary reference intakes for  calcium and D. [place]: The [employer]. 2. Holick MF, Binkley Warren, Bischoff-Ferrari HA, et al. Evaluation,  treatment, and prevention of vitamin D deficiency: an Endocrine  Society clinical practice guideline, JCEM. 2011 Jul; 96(7): 1911-30.  Performed at [facility], [place] [address] [place], [place] [address]   Comprehensive metabolic panel     Status: Abnormal   Collection Time: 02/12/21  2:13 AM  Result Value Ref Range   Sodium 131 (L) 135 - 145 mmol/L   Potassium 4.2 3.5 - 5.1 mmol/L   Chloride 98 98 - 111 mmol/L   CO2 24 22 - 32 mmol/L   Glucose, Bld 115 (H) 70 - 99 mg/dL    Comment: Glucose reference range applies only to samples taken after fasting for at least 8 hours.   BUN 16 8 - 23 mg/dL   Creatinine, Ser 0.80 0.61 - 1.24 mg/dL   Calcium 10.1 8.9 - 10.3 mg/dL   Total Protein 5.8 (L) 6.5 - 8.1 g/dL   Albumin 3.0 (L) 3.5 - 5.0 g/dL   AST 21 15 - 41 U/L   ALT 14 0 - 44 U/L   Alkaline Phosphatase 84 38 - 126 U/L   Total Bilirubin 1.0 0.3 - 1.2 mg/dL   GFR, Estimated >60 >60 mL/min    Comment: (NOTE) Calculated using the CKD-EPI Creatinine Equation (2021)    Anion gap 9 5 - 15    Comment: Performed at [facility], [place] [address], [place], [place] [address]  Prealbumin     Status: Abnormal   Collection Time: 02/12/21  2:13 AM  Result Value Ref Range   Prealbumin 15.6 (L) 18 - 38 mg/dL    Comment: Performed at [facility] [address], [place], [place] [address]  Magnesium     Status: None  Collection Time: 02/12/21  2:13 AM  Result Value Ref Range   Magnesium 1.7 1.7 - 2.4 mg/dL    Comment: Performed at [facility], [place] [address] [place], [place] [address]  Phosphorus     Status: None   Collection Time: 02/12/21  2:13 AM  Result Value Ref Range   Phosphorus 3.4 2.5 - 4.6 mg/dL    Comment: Performed at [facility] [address] [place], [place] [address]   No results found.     Medical Problem List and Plan: 1. 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  -patient may shower but incision must be covered  -ELOS/Goals: 10-14 days S 2.  Antithrombotics: -DVT/anticoagulation:  Pharmaceutical: Other (comment) Eliquis  -antiplatelet therapy: none 3. Pain Management: oxycodone IR 10 mg every 4 hours; Tylenol 650 mg every 6 hours; Flexeril 10 mg TID PRN 4. Mood: LCSW to provide support  -antipsychotic agents: n/a 5. Neuropsych: This patient is capable of making decisions on his own behalf. 6. Skin/Wound Care: incision care:dry dressing and monitor; routine skin checks 7. Fluids/Electrolytes/Nutrition: routine ins and outs with follow-up chemistries 8. Persistent atrial fibrillation: rate controlled; continue apixaban 9. Dyslipidemia: continue Crestor 10 mg daily, heart healthy diet 10: History of prostate cancer; no evidence currently of metastases; completed radiation therapy and hormonal therapy [facility]). L5 biopsy with no evidence of metastasis. Continue calcitonin, Flomax. Check vitamin D level tomorrow. 11: mild normocytic, normochromic anemia: monitor CBC, repeat tomorrow 12 Cancer-related fatigue: discussed with him over the counter N-acetyl-cysteine 600mg  BID versus Modafinil and he prefers to start with the former- he will ask his wife to purchase and advised him to give medication to nurse for [place] to place patient may use own med order.   I have personally performed a face to face diagnostic evaluation, including, but not limited to relevant history and physical exam  findings, of this patient and developed relevant assessment and plan.  Additionally, I have reviewed and concur with the physician assistant's documentation above.  Risa Grill, PA  Izora Ribas, MD  02/12/2021

## 2021-02-12 NOTE — Progress Notes (Signed)
This pt transferred to 4W12. Report given to Katharine Look, [place]. All belongings gathered and placed on pts bedside table, including clothes, shoes, cell phone and cell phone charger. Pt transferred by bed with no complaints.

## 2021-02-12 NOTE — Progress Notes (Signed)
Family Medicine Teaching Service Daily Progress Note Intern Pager: [phone]  Patient name: Todd Chapman Medical record number: [id number] Date of birth: [birth year] Age: 75 y.o. Gender: male  Primary Care Provider: [name], MD Consultants: Neurosurgery, orthopedics Code Status: Full  Pt Overview and Major Events to Date:  12/1: Admitted for L5 compression fracture 12/2: Neurosurgery consulted, MR showing the L5 compression fracture as well as bilateral acetabular stress fractures 12/3: Ortho consulted and will plan for follow-up outpatient 12/5: kyphoplasty with neurosurgery  Assessment and Plan: Todd Chapman is a 75 y.o.male with L5 compression fracture and bilateral acetabular stress fractures.  Past medical history of A. fib on Eliquis status post pacemaker placement, prostate cancer status post radiation, HLD, GERD.  L5 compression fracture status post kyphoplasty  bilateral acetabular stress fractures PT/OT have recommended CIR. -Neurosurgery following, appreciate recs -Continue Oxy IR 10 mg every 4 hours, Tylenol 1000 mg every 6 hours -Oxy 10 milligrams every 3 hours as needed for severe pain, Oxy 5 milligrams every 3 hours as needed for moderate pain, Tylenol every 4 hours as needed for mild pain -Continue calcitonin 1 spray alternating nares daily  All other chronic, stable conditions medically managed with home medications as appropriate.  FEN/GI: heart healthy PPx: Eliquis Dispo:CIR today pending bed placement.  Stable and ready for CIR.  Subjective:  Patient is doing well this morning.  He is hopeful of being moved to CIR today and encouraged by rehab given he enjoys working out.  Objective: Temp:  [97.6 F (36.4 C)-99.8 F (37.7 C)] 97.6 F (36.4 C) (12/07 0541) Pulse Rate:  [76-93] 78 (12/07 0541) Resp:  [17-18] 17 (12/07 0541) BP: (103-164)/(71-120) 107/71 (12/07 0541) SpO2:  [94 %-100 %] 96 % (12/07 0541) Physical Exam: General: WDWN,  NAD Cardiovascular: Irregularly irregular, no murmurs auscultated Respiratory: CTA B, no increased work of breathing Extremities: No pitting edema bilaterally  Laboratory: Recent Labs  Lab 02/08/21 0331 02/09/21 1131 02/11/21 0057  WBC 7.6 5.8 5.2  HGB 9.7* 12.1* 10.6*  HCT 29.9* 37.6* 33.2*  PLT 191 244 194   Recent Labs  Lab 02/06/21 1228 02/08/21 0331 02/10/21 0156 02/11/21 0057 02/12/21 0213  NA 137  --  132* 133* 131*  K 3.6  --  4.4 4.5 4.2  CL 103  --  99 99 98  CO2 25  --  27 26 24   BUN 15  --  12 12 16   CREATININE 0.77  --  0.78 0.82 0.80  CALCIUM 10.1  --  10.1 10.0 10.1  PROT 6.5 5.5*  --   --  5.8*  BILITOT 1.5* 0.9  --   --  1.0  ALKPHOS 91 76  --   --  84  ALT 24 22  --   --  14  AST 26 24  --   --  21  GLUCOSE 108*  --  130* 104* 115*   Imaging/Diagnostic Tests: No results found.  Wells Guiles, DO 02/12/2021, 6:36 AM PGY-1, [place] Intern pager: [phone], text pages welcome

## 2021-02-12 NOTE — Progress Notes (Signed)
PMR Admission Coordinator Pre-Admission Assessment   Patient: Todd Chapman is an 75 y.o., male MRN: [id number] DOB: 1945/12/01 Height: 6\' 2"  (188 cm) Weight: 92.1 kg   Insurance Information HMO:     PPO:      PCP:      IPA:      80/20:      OTHER:  PRIMARY: Medicare A/B      Policy#: [id number]      Subscriber: pt CM Name:       Phone#:      Fax#:  Pre-Cert#: verified Civil engineer, contracting:  Benefits:  Phone #:      Name:  Eff. Date: A 04/09/08, B 05/07/08     Deduct: $1556      Out of Pocket Max: n/a      Life Max: n/a CIR: 100%      SNF: 20 full days Outpatient: 80%     Co-Ins: 20% Home Health: 100%      Co-Pay:  DME: 80%     Co-Ins: 20% Providers:  SECONDARY: BCBS Supplement      Policy#: [id number]     Phone#: [phone]   Financial Counselor:       Phone#:    The "Data Collection Information Summary" for patients in Inpatient Rehabilitation Facilities with attached "Privacy Act [place] Records" was provided and verbally reviewed with: Patient   Emergency Contact Information Contact Information       Name Relation Home Work Mobile    [place] Spouse [phone]   [phone]           Current Medical History  Patient Admitting Diagnosis: multiple fractures   History of Present Illness: Pt is a 75 y/o male with PMH of afib (on eliquis with pacemaker), prostate cancer (treated with radiation), and aspiration PNA admitted on 02/06/21 to [facility] with radiating back pain with concern for malignant process.  ED course showed WBC 15 (in the setting of recent prednisone taper), HR 162, BP 149/112, resp 29, SpO2 100% on room air.  CT lumbar spine showed compression fracture at L5, supported by abd/pelvic CT.  Neurosurgery was consulted and pt underwent kyphoplasty on 12/5 per Dr. Arnoldo Morale.  MRI lumbar/pelvis showed bilateral acetabular stress fractures.  Orthopedics consulted and recommended pain management and WBAT BLE.  Pain currently controlled on PO  medication.  Hospital course pain management and ABLA.  Therapy evaluations completed and pt was recommended for CIR.    Patient's medical record from [facility] has been reviewed by the rehabilitation admission coordinator and physician.   Past Medical History      Past Medical History:  Diagnosis Date   Arthritis      "thumbs, back" (02/16/2017)   Bradycardia 02/2017   Dyspnea      with exertion   Flu 2 weeks ago   GERD (gastroesophageal reflux disease)     Hepatitis A as child   HLD (hyperlipidemia)     Persistent atrial fibrillation ([place]) 12/18/2016   Pneumonia 1999; 2018   Presence of permanent cardiac pacemaker      st jude   Prostate cancer [facility])        Has the patient had major surgery during 100 days prior to admission? Yes   Family History   family history includes CVA in his mother; Coronary artery disease in his father and mother; Heart attack in his father; Stomach cancer in his maternal grandmother; Stroke in his mother.   Current Medications  Current Facility-Administered Medications:    0.9 %  sodium chloride infusion, 250 mL, Intravenous, Continuous, Newman Pies, MD   acetaminophen (TYLENOL) tablet 650 mg, 650 mg, Oral, Q4H PRN, 650 mg at 02/11/21 0048 **OR** acetaminophen (TYLENOL) suppository 650 mg, 650 mg, Rectal, Q4H PRN, Newman Pies, MD   acetaminophen (TYLENOL) tablet 1,000 mg, 1,000 mg, Oral, Q6H, Newman Pies, MD, 1,000 mg at 02/11/21 1100   [START ON 02/12/2021] acetaminophen (TYLENOL) tablet 650 mg, 650 mg, Oral, Q6H, Newman Pies, MD   apixaban Arne [place]) tablet 5 mg, 5 mg, Oral, BID, Dahbura, Anton, DO, 5 mg at 02/11/21 1100   bisacodyl (DULCOLAX) suppository 10 mg, 10 mg, Rectal, Daily PRN, Newman Pies, MD   calcitonin (salmon) (MIACALCIN/FORTICAL) nasal spray 1 spray, 1 spray, Alternating Nares, Daily, Alroy Dust, RN, 1 spray at 02/11/21 1101   calcium carbonate (TUMS - dosed in mg elemental calcium) chewable tablet 400  mg of elemental calcium, 2 tablet, Oral, Daily PRN, Wells Guiles, DO   cyclobenzaprine (FLEXERIL) tablet 10 mg, 10 mg, Oral, TID PRN, Newman Pies, MD   furosemide (LASIX) tablet 20 mg, 20 mg, Oral, Daily, Wells Guiles, DO, 20 mg at 02/11/21 1100   menthol-cetylpyridinium (CEPACOL) lozenge 3 mg, 1 lozenge, Oral, PRN **OR** phenol (CHLORASEPTIC) mouth spray 1 spray, 1 spray, Mouth/Throat, PRN, Newman Pies, MD   morphine 4 MG/ML injection 4 mg, 4 mg, Intravenous, Q2H PRN, Newman Pies, MD   multivitamin with minerals tablet 1 tablet, 1 tablet, Oral, Daily, Wells Guiles, DO, 1 tablet at 02/11/21 1100   ondansetron (ZOFRAN) tablet 4 mg, 4 mg, Oral, Q6H PRN **OR** ondansetron (ZOFRAN) injection 4 mg, 4 mg, Intravenous, Q6H PRN, Newman Pies, MD   oxyCODONE (Oxy IR/ROXICODONE) immediate release tablet 10 mg, 10 mg, Oral, Q4H, Dahbura, Anton, DO, 10 mg at 02/11/21 1349   oxyCODONE (Oxy IR/ROXICODONE) immediate release tablet 10 mg, 10 mg, Oral, Q3H PRN, Newman Pies, MD   oxyCODONE (Oxy IR/ROXICODONE) immediate release tablet 5 mg, 5 mg, Oral, Q3H PRN, Newman Pies, MD   pantoprazole (PROTONIX) EC tablet 40 mg, 40 mg, Oral, Daily, Wells Guiles, DO, 40 mg at 02/11/21 1059   polyethylene glycol (MIRALAX / GLYCOLAX) packet 17 g, 17 g, Oral, Daily, Wells Guiles, DO, 17 g at 02/11/21 1059   polyvinyl alcohol (LIQUIFILM TEARS) 1.4 % ophthalmic solution 1 drop, 1 drop, Both Eyes, PRN, Heloise Purpura, RPH, 1 drop at 02/09/21 0910   rosuvastatin (CRESTOR) tablet 10 mg, 10 mg, Oral, q morning, Dahbura, Anton, DO, 10 mg at 02/11/21 1104   senna (SENOKOT) tablet 8.6 mg, 1 tablet, Oral, Daily, Wells Guiles, DO, 8.6 mg at 02/11/21 1100   sodium chloride (OCEAN) 0.65 % nasal spray 1 spray, 1 spray, Each Nare, Daily PRN, Wells Guiles, DO   sodium chloride flush (NS) 0.9 % injection 3 mL, 3 mL, Intravenous, Q12H, Newman Pies, MD, 3 mL at 02/11/21 1105   sodium chloride flush  (NS) 0.9 % injection 3 mL, 3 mL, Intravenous, PRN, Newman Pies, MD   tamsulosin [facility]) capsule 0.4 mg, 0.4 mg, Oral, Daily, Wells Guiles, DO, 0.4 mg at 02/10/21 1759   Patients Current Diet:  Diet Order                  Diet Heart Room service appropriate? Yes; Fluid consistency: Thin  Diet effective now                         Precautions /  Restrictions Precautions Precautions: Back, Fall Precaution Booklet Issued: Yes (comment) Precaution Comments: Reinforced back precautions; Pt will tend to twist when demonstrating something, or pointing out where pain is; cues to be more aware Restrictions Weight Bearing Restrictions: No Other Position/Activity Restrictions: Per Ortho consult, WBAT bil hips    Has the patient had 2 or more falls or a fall with injury in the past year? No   Prior Activity Level Community (5-7x/wk): very active prior to this episode.  Most recently using a rollator.  Is a partially retired Forensic psychologist.  Driving.   Prior Functional Level Self Care: Did the patient need help bathing, dressing, using the toilet or eating? Independent   Indoor Mobility: Did the patient need assistance with walking from room to room (with or without device)? Independent   Stairs: Did the patient need assistance with internal or external stairs (with or without device)? Independent   Functional Cognition: Did the patient need help planning regular tasks such as shopping or remembering to take medications? Independent   Patient Information Are you of Hispanic, Latino/a,or Spanish origin?: A. No, not of Hispanic, Latino/a, or Spanish origin What is your race?: A. White Do you need or want an interpreter to communicate with a doctor or health care staff?: 0. No   Patient's Response To:  Health Literacy and Transportation Is the patient able to respond to health literacy and transportation needs?: Yes Health Literacy - How often do you need to have someone help you when  you read instructions, pamphlets, or other written material from your doctor or pharmacy?: Never In the past 12 months, has lack of transportation kept you from medical appointments or from getting medications?: No In the past 12 months, has lack of transportation kept you from meetings, work, or from getting things needed for daily living?: No   Home Assistive Devices / [place]: Cane - single point, Rollator (4 wheels)   Prior Device Use: Indicate devices/aids used by the patient prior to current illness, exacerbation or injury?  Occasional rollator immediately prior to admission due to worsening pain   Current Functional Level Cognition   Overall Cognitive Status: Within Functional Limits for tasks assessed Orientation Level: Oriented X4    Extremity Assessment (includes Sensation/Coordination)   Upper Extremity Assessment: Overall WFL for tasks assessed  Lower Extremity Assessment: Defer to PT evaluation     ADLs   Overall ADL's : Needs assistance/impaired Eating/Feeding: Independent Grooming: Set up, Sitting Upper Body Bathing: Set up, Sitting Lower Body Bathing: Moderate assistance, Sitting/lateral leans Lower Body Bathing Details (indicate cue type and reason): educated in LB bathing leaning side to side in sitting Upper Body Dressing : Set up, Sitting Lower Body Dressing: Moderate assistance, Sit to/from stand, +2 for safety/equipment Lower Body Dressing Details (indicate cue type and reason): able to cross foot over opposite knee to don socks Toilet Transfer: Moderate assistance, +2 for physical assistance, +2 for safety/equipment, Ambulation, BSC/3in1, Rolling walker (2 wheels), Cueing for safety Toileting- Clothing Manipulation and Hygiene: Minimal assistance, Sit to/from stand Functional mobility during ADLs: Moderate assistance, Maximal assistance, +2 for physical assistance, +2 for safety/equipment, Cueing for safety, Rolling walker (2 wheels)      Mobility   Overal bed mobility: Modified Independent Bed Mobility: Rolling, Sidelying to Sit, Sit to Sidelying Rolling: Supervision Sidelying to sit: Supervision Sit to sidelying: Supervision General bed mobility comments: Sitting EOB on arrival     Transfers   Overall transfer level: Needs assistance Equipment used: Rolling walker (2  wheels) Transfers: Sit to/from Stand Sit to Stand: Mod assist, +2 safety/equipment General transfer comment: Light mod assist to steady; very slow rise, pt dependent on UEs pushing up on RW to get to fully upright standing     Ambulation / Gait / Stairs / Wheelchair Mobility   Ambulation/Gait Ambulation/Gait assistance: Mod assist, Max assist, +2 safety/equipment Gait Distance (Feet): 5 Feet Assistive device: Rolling walker (2 wheels) Gait Pattern/deviations: Decreased step length - right, Decreased step length - left General Gait Details: Very slow moving; Heavy dependence on UE support on RW; Extremely tense throughout upper body; Requires extensive assist to move, advance, and turn RW because he puts a lot of weight through RW to [place] hips/LEs; R UE tended to fatigue, and pt requested support at R axilla towards the end of the walk; overall very tense, and anxious with walking Gait velocity: very slow     Posture / Balance Balance Overall balance assessment: Needs assistance Sitting-balance support: No upper extremity supported, Feet supported Sitting balance-Leahy Scale: Fair Standing balance support: Bilateral upper extremity supported Standing balance-Leahy Scale: Poor Standing balance comment: Reliant on BUE support, less than upon discharge     Special needs/care consideration N/a    Previous Home Environment (from acute therapy documentation) Living Arrangements: Spouse/significant other Available Help at Discharge: Family Type of Home: House Home Layout: Multi-level, Able to live on main level with bedroom/bathroom Alternate Level  Stairs-Rails: Right Alternate Level Stairs-Number of Steps: 14 Home Access: Stairs to enter Entrance Stairs-Rails: None Entrance Stairs-Number of Steps: 2 Bathroom Shower/Tub: Multimedia programmer: Handicapped height   Discharge Living Setting Plans for Discharge Living Setting: Patient's home, Lives with (comment) (spouse) Type of Home at Discharge: House Discharge Home Layout: Multi-level, 1/2 bath on main level Alternate Level Stairs-Rails: Left, Right Alternate Level Stairs-Number of Steps: full flight Discharge Home Access: Stairs to enter Entrance Stairs-Rails: None Entrance Stairs-Number of Steps: 2 Discharge Bathroom Shower/Tub: Walk-in shower Discharge Bathroom Toilet: Handicapped height Discharge Bathroom Accessibility: Yes How Accessible: Accessible via walker Does the patient have any problems obtaining your medications?: No   Social/Family/Support Systems Patient Roles: Spouse Anticipated Caregiver: spouse Dontae Minerva) Anticipated Caregiver's Contact Information: [name] [phone] Ability/Limitations of Caregiver: supervision only Caregiver Availability: 24/7 (spouse works from home 3x/week and in the office 2x/week, could potentially arrange some short term time off *if needed*) Discharge Plan Discussed with Primary Caregiver: Yes Is Caregiver In Agreement with Plan?: Yes Does Caregiver/Family have Issues with Lodging/Transportation while Pt is in Rehab?: No   Goals Patient/Family Goal for Rehab: PT/OT supervision to mod I, SLP n/a Expected length of stay: 14-16 days Pt/Family Agrees to Admission and willing to participate: Yes Program Orientation Provided & Reviewed with Pt/Caregiver Including Roles  & Responsibilities: Yes  Barriers to Discharge: Home environment access/layout   Decrease burden of Care through IP rehab admission: n/a   Possible need for SNF placement upon discharge: No   Patient Condition: I have reviewed medical records from  [facility], spoken with CM, and patient. I met with patient at the bedside for inpatient rehabilitation assessment.  Patient will benefit from ongoing PT and OT, can actively participate in 3 hours of therapy a day 5 days of the week, and can make measurable gains during the admission.  Patient will also benefit from the coordinated team approach during an Inpatient Acute Rehabilitation admission.  The patient will receive intensive therapy as well as Rehabilitation physician, nursing, social worker, and care management interventions.  Due to safety,  skin/wound care, medication administration, pain management, and patient education the patient requires 24 hour a day rehabilitation nursing.  The patient is currently mod to max assist with mobility and basic ADLs.  Discharge setting and therapy post discharge at home with home health is anticipated.  Patient has agreed to participate in the Acute Inpatient Rehabilitation Program and will admit today.   Preadmission Screen Completed By:  Michel Santee, PT, DPT 02/11/2021 2:53 PM ______________________________________________________________________   Discussed status with Dr. Ranell Patrick on 02/12/21  at 10:10 AM  and received approval for admission today.   Admission Coordinator:  Michel Santee, PT, DPT time 10:10 AM Sudie Grumbling 02/12/21     Assessment/Plan: Diagnosis: Closed fracture of L5 lumbar vertebra Does the need for close, 24 hr/day Medical supervision in concert with the patient's rehab needs make it unreasonable for this patient to be served in a less intensive setting? Yes Co-Morbidities requiring supervision/potential complications: Overweight (BMI 26.06), bilateral acetabular fractures, bradycardia, premature atrial contraction, atrial fibrillation Due to bladder management, bowel management, safety, skin/wound care, disease management, medication administration, pain management, and patient education, does the patient require 24 hr/day rehab  nursing? Yes Does the patient require coordinated care of a physician, rehab nurse, PT, OT to address physical and functional deficits in the context of the above medical diagnosis(es)? Yes Addressing deficits in the following areas: balance, endurance, locomotion, strength, transferring, bowel/bladder control, bathing, dressing, feeding, grooming, toileting, and psychosocial support Can the patient actively participate in an intensive therapy program of at least 3 hrs of therapy 5 days a week? Yes The potential for patient to make measurable gains while on inpatient rehab is excellent Anticipated functional outcomes upon discharge from inpatient rehab: supervision PT, supervision OT, independent SLP Estimated rehab length of stay to reach the above functional goals is: 2-3 weeks Anticipated discharge destination: Home 10. Overall Rehab/Functional Prognosis: excellent     MD Signature: Leeroy Cha, MD

## 2021-02-12 NOTE — H&P (Incomplete)
Physical Medicine and Rehabilitation Admission H&P    Chief Complaint  Patient presents with   Back Pain  : HPI: Todd Chapman is a 75 year old male who initially presented to the emergency department with acute onset of mid lumbar back pain on 02/06/2021.  A new inferior endplate compression deformity involving L5 vertebral body was noted.  No cauda equina syndrome noted.  Family medicine was contacted for admission.  Neurosurgery was consulted. He underwent L5 kyphoplasty with vertebral body biopsies by Dr. Arnoldo Morale on February 10, 2021.  Because of his persistent low back pain, orthopedic surgery was consulted.  MRI of the pelvis was performed and revealed bilateral acetabular/supra acetabular stress fractures in the pelvis.  These findings were not felt to be clinically significant or would preclude progression to weightbearing.  Metabolic bone disease work-up in progress. He will need inpatient rehab therapies for decreased functional mobility due to acute on chronic back pain from compression and stress fractures.  He is interviewed on the orthopedic floor where he is out of bed to chair complaining of mild right lower back pain.  He states he is able to ambulate with rolling walker but cannot balance himself without support.  He is tolerating his diet and voiding spontaneously.  Local history significant for prostate cancer status post radiation therapy treatment and hormone suppressive therapy.  No known metastasis. Prior history of right elbow fracture and left wrist fracture. Past surgical history significant for diagnostic laparoscopy with lysis of adhesive band on 06/04/2020 secondary to small bowel obstruction. Past medical history is significant for persistent atrial fibrillation on chronic apixaban therapy.  Status post pacemaker placement.  History of dyslipidemia.  He is not diabetic.  No history of coronary artery disease.  He lives with his wife in a 3 story town home. 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   Next of kin: wife, Vickie Review of Systems  Respiratory:  Negative for shortness of breath.   Gastrointestinal:  Negative for nausea and vomiting.  Musculoskeletal:  Positive for back pain.  Past Medical History:  Diagnosis Date   Arthritis    "thumbs, back" (02/16/2017)   Bradycardia 02/2017   Dyspnea    with exertion   Flu 2 weeks ago   GERD (gastroesophageal reflux disease)    Hepatitis A as child   HLD (hyperlipidemia)    Persistent atrial fibrillation ([place]) 12/18/2016   Pneumonia 1999; 2018   Presence of permanent cardiac pacemaker    st jude   Prostate cancer [facility])    Past Surgical History:  Procedure Laterality Date   APPENDECTOMY     age 75   CARDIOVERSION  02/16/2017   CARDIOVERSION N/A 02/16/2017   Procedure: CARDIOVERSION;  Surgeon: Thompson Grayer, MD;  Location: [facility];  Service: Cardiovascular;  Laterality: N/A;   COLONOSCOPY WITH PROPOFOL N/A 12/13/2012   Procedure: COLONOSCOPY WITH PROPOFOL;  Surgeon: Garlan Fair, MD;  Location: [facility];  Service: Endoscopy;  Laterality: N/A;   FRACTURE SURGERY     HYDROCELE EXCISION Left 05/16/2018   Procedure: HYDROCELECTOMY ADULT;  Surgeon: [name], MD;  [facility];  Service: Urology;  Laterality: Left;   INSERT / REPLACE / REMOVE PACEMAKER  02/16/2017   KYPHOPLASTY N/A 02/10/2021   Procedure: KYPHOPLASTY L-5;  Surgeon: Newman Pies, MD;  Location: [facility];  Service: Neurosurgery;  Laterality: N/A;   LAPAROSCOPY N/A 06/04/2020   Procedure: POSSIBLE LAPAROTOMY POSSIBLE BOWEL  RESSECTION;  Surgeon: Clovis Riley, MD;  Location: [facility];  Service: General;  Laterality: N/A;   PACEMAKER IMPLANT N/A 02/16/2017   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 His smoking use included cigarettes. He has a 0.96 pack-year smoking history. He has never used smokeless tobacco. He reports current alcohol use. He reports that he does not use drugs. Allergies:  Allergies  Allergen Reactions   Atorvastatin Palpitations   Ezetimibe Palpitations   Medications Prior to Admission  Medication Sig Dispense Refill   acetaminophen (TYLENOL) 500 MG tablet Take 500 mg by mouth daily as needed for mild pain.     calcium carbonate (TUMS - DOSED IN MG ELEMENTAL CALCIUM) 500 MG chewable tablet Chew 2 tablets by mouth daily as needed for indigestion or heartburn.     ELIQUIS 5 MG TABS tablet TAKE 1 TABLET(5 MG) BY MOUTH TWICE DAILY 180 tablet 1   furosemide (LASIX) 20 MG tablet Take 20 mg by mouth daily.     lidocaine (LIDODERM) 5 % 1 patch daily as needed (pain).     Multiple Vitamin (MULTIVITAMIN WITH MINERALS) TABS tablet Take 1 tablet by mouth daily.     Multiple Vitamins-Minerals (EYE VITAMINS) CAPS Take 1 capsule by mouth daily.      oxyCODONE-acetaminophen (PERCOCET/ROXICET) 5-325 MG tablet Take 1 tablet by mouth every 6 (six) hours as needed for severe pain.     pantoprazole (PROTONIX) 40 MG tablet Take 40 mg daily by mouth.  2   Propylene Glycol (SYSTANE BALANCE OP) Place 1 drop into both eyes daily.     rosuvastatin (CRESTOR) 10 MG tablet Take 10 mg by mouth every morning.     sodium chloride (OCEAN) 0.65 % SOLN nasal spray Place 1 spray into both nostrils daily as needed for congestion.     tamsulosin (FLOMAX) 0.4 MG CAPS capsule TAKE 1 CAPSULE(0.4 MG) BY MOUTH DAILY AFTER AND  SUPPER 30 capsule 5   trolamine salicylate (ASPERCREME) 10 % cream Apply 1 application topically daily.      Drug Regimen Review  Drug regimen was reviewed and remains appropriate with no significant issues identified  Home: Home Living Family/patient expects to be discharged to:: Private residence Living Arrangements: Spouse/significant other Available Help at Discharge: Family Type of Home: House Home Access: Stairs to enter Technical brewer of Steps: 2 Entrance Stairs-Rails: None Home Layout: Multi-level, Able to live on main level with bedroom/bathroom Alternate Level Stairs-Number of Steps: 14 Alternate Level Stairs-Rails: Right Bathroom Shower/Tub: Multimedia programmer: Handicapped height Home Equipment: [place] - single point, Rollator (4 wheels)   Functional History: Prior Function Prior Level of Function : Independent/Modified Independent Mobility Comments: Has been using cane for ambulation ADLs Comments: independent  Functional Status:  Mobility: Bed Mobility Overal bed mobility: Modified Independent Bed Mobility: Rolling, Sidelying to Sit, Sit  to Sidelying Rolling: Supervision Sidelying to sit: Supervision Sit to sidelying: Supervision General bed mobility comments: Sitting EOB on arrival Transfers Overall transfer level: Needs assistance Equipment used: Rolling walker (2 wheels) Transfers: Sit to/from Stand Sit to Stand: Mod assist, +2 safety/equipment General transfer comment: Light mod assist to steady; very slow rise, pt dependent on UEs pushing up on RW to get to fully upright standing Ambulation/Gait Ambulation/Gait assistance: Mod assist, Max assist, +2 safety/equipment Gait Distance (Feet): 5 Feet Assistive device: Rolling walker (2 wheels) Gait Pattern/deviations: Decreased step length - right, Decreased step length - left General Gait Details: Very slow moving; Heavy dependence on UE support on RW; Extremely tense throughout upper  body; Requires extensive assist to move, advance, and turn RW because he puts a lot of weight through RW to [place] hips/LEs; R UE tended to fatigue, and pt requested support at R axilla towards the end of the walk; overall very tense, and anxious with walking Gait velocity: very slow    ADL: ADL Overall ADL's : Needs assistance/impaired Eating/Feeding: Independent Grooming: Set up, Sitting Upper Body Bathing: Set up, Sitting Lower Body Bathing: Moderate assistance, Sitting/lateral leans Lower Body Bathing Details (indicate cue type and reason): educated in LB bathing leaning side to side in sitting Upper Body Dressing : Set up, Sitting Lower Body Dressing: Moderate assistance, Sit to/from stand, +2 for safety/equipment Lower Body Dressing Details (indicate cue type and reason): able to cross foot over opposite knee to don socks Toilet Transfer: Moderate assistance, +2 for physical assistance, +2 for safety/equipment, Ambulation, BSC/3in1, Rolling walker (2 wheels), Cueing for safety Toileting- Clothing Manipulation and Hygiene: Minimal assistance, Sit to/from stand Functional mobility during ADLs: Moderate assistance, Maximal assistance, +2 for physical assistance, +2 for safety/equipment, Cueing for safety, Rolling walker (2 wheels)  Cognition: Cognition Overall Cognitive Status: Within Functional Limits for tasks assessed Orientation Level: Oriented X4 Cognition Arousal/Alertness: Awake/alert Behavior During Therapy: WFL for tasks assessed/performed Overall Cognitive Status: Within Functional Limits for tasks assessed  Physical Exam: Blood pressure (!) 142/79, pulse 69, temperature 98 F (36.7 C), temperature source Oral, resp. rate 16, height 6\' 2"  (1.88 m), weight 92.1 kg, SpO2 100 %. Physical Exam Constitutional:      General: He is not in acute distress. HENT:     Head: Normocephalic and atraumatic.  Neurological:     Mental Status: He is oriented to person, place, and  time.    Results for orders placed or performed during the hospital encounter of 02/06/21 (from the past 48 hour(s))  Surgical pathology     Status: None   Collection Time: 02/10/21  1:59 PM  Result Value Ref Range   SURGICAL PATHOLOGY      SURGICAL PATHOLOGY CASE: [id number] PATIENT: Zenaida Niece Surgical Pathology Report     Clinical History: compression fracture (cm)     FINAL MICROSCOPIC DIAGNOSIS:  A. BONE, L5, BIOPSY: A segment of bone with hypocellular marrow and normal distribution of hemopoietic cells. There are no features suggestive of a marrow infiltrative process or a neoplastic process.   GROSS DESCRIPTION:  Received fresh are 2 cores of bone which measure 0.9 and 1.7 cm in length and each 0.3 cm in diameter.  The specimen is submitted in toto following decalcification (Immunocal).  [facility] 02/11/2021)   Final Diagnosis performed by Unknown Jim, MD.   Electronically signed 02/12/2021 Technical and / or Professional components performed at [facility]. [facility], [place] [address], [place], [place] [address].  Immunohistochemistry Technical component (if  applicable) was performed at [facility]. [address], [place], [place], [place] [address].   I MMUNOHISTOCHEMISTRY DISCLAIMER (if applicable): Some of these immunohistochemical stains may have been developed and the performance characteristics determine by [facility]. Some may not have been cleared or approved by the U.S. Food and Drug Administration. The FDA has determined that such clearance or approval is not necessary. This test is used for clinical purposes. It should not be regarded as investigational or for research. This laboratory is certified under the [place] (CLIA-88) as qualified to perform high complexity clinical laboratory testing.  The controls stained appropriately.   CBC     Status: Abnormal    Collection Time: 02/11/21 12:57 AM  Result Value Ref Range   WBC 5.2 4.0 - 10.5 K/uL   RBC 3.50 (L) 4.22 - 5.81 MIL/uL   Hemoglobin 10.6 (L) 13.0 - 17.0 g/dL   HCT 33.2 (L) 39.0 - 52.0 %   MCV 94.9 80.0 - 100.0 fL   MCH 30.3 26.0 - 34.0 pg   MCHC 31.9 30.0 - 36.0 g/dL   RDW 13.7 11.5 - 15.5 %   Platelets 194 150 - 400 K/uL   nRBC 0.0 0.0 - 0.2 %    Comment: Performed at [facility], [place] [address] [place], [place] [address]  Basic metabolic panel     Status: Abnormal   Collection Time: 02/11/21 12:57 AM  Result Value Ref Range   Sodium 133 (L) 135 - 145 mmol/L   Potassium 4.5 3.5 - 5.1 mmol/L   Chloride 99 98 - 111 mmol/L   CO2 26 22 - 32 mmol/L   Glucose, Bld 104 (H) 70 - 99 mg/dL    Comment: Glucose reference range applies only to samples taken after fasting for at least 8 hours.   BUN 12 8 - 23 mg/dL   Creatinine, Ser 0.82 0.61 - 1.24 mg/dL   Calcium 10.0 8.9 - 10.3 mg/dL   GFR, Estimated >60 >60 mL/min    Comment: (NOTE) Calculated using the CKD-EPI Creatinine Equation (2021)    Anion gap 8 5 - 15    Comment: Performed at [facility] [address], [place], [place] [address]  TSH     Status: None   Collection Time: 02/12/21  2:13 AM  Result Value Ref Range   TSH 1.690 0.350 - 4.500 uIU/mL    Comment: Performed by a 3rd Generation assay with a functional sensitivity of <=0.01 uIU/mL. Performed at [facility], [place] [address], [place], [place] [address]   VITAMIN D 25 Hydroxy (Vit-D Deficiency, Fractures)     Status: None   Collection Time: 02/12/21  2:13 AM  Result Value Ref Range   Vit D, 25-Hydroxy 46.88 30 - 100 ng/mL    Comment: (NOTE) Vitamin D deficiency has been defined by the Institute of Medicine  and an Endocrine Society practice guideline as a level of serum 25-OH  vitamin D less than 20 ng/mL (1,2). The Endocrine Society went on to  further define vitamin D insufficiency as a level between 21 and 29  ng/mL (2).  1. IOM (Institute  of Medicine). 2010. Dietary reference intakes for  calcium and D. [place]: The [employer]. 2. Holick MF, Binkley [place], Bischoff-Ferrari HA, et al. Evaluation,  treatment, and prevention of vitamin D deficiency: an Endocrine  Society clinical practice guideline, JCEM. 2011 Jul; 96(7): 1911-30.  Performed at [facility]  [facility], [place] [address], [place], [place] [address]   Comprehensive metabolic panel     Status: Abnormal   Collection Time: 02/12/21  2:13 AM  Result Value Ref Range   Sodium 131 (L) 135 - 145 mmol/L   Potassium 4.2 3.5 - 5.1 mmol/L   Chloride 98 98 - 111 mmol/L   CO2 24 22 - 32 mmol/L   Glucose, Bld 115 (H) 70 - 99 mg/dL    Comment: Glucose reference range applies only to samples taken after fasting for at least 8 hours.   BUN 16 8 - 23 mg/dL   Creatinine, Ser 0.80 0.61 - 1.24 mg/dL   Calcium 10.1 8.9 - 10.3 mg/dL   Total Protein 5.8 (L) 6.5 - 8.1 g/dL   Albumin 3.0 (L) 3.5 - 5.0 g/dL   AST 21 15 - 41 U/L   ALT 14 0 - 44 U/L   Alkaline Phosphatase 84 38 - 126 U/L   Total Bilirubin 1.0 0.3 - 1.2 mg/dL   GFR, Estimated >60 >60 mL/min    Comment: (NOTE) Calculated using the CKD-EPI Creatinine Equation (2021)    Anion gap 9 5 - 15    Comment: Performed at [facility], [place] [address] [place], [place] [address]  Prealbumin     Status: Abnormal   Collection Time: 02/12/21  2:13 AM  Result Value Ref Range   Prealbumin 15.6 (L) 18 - 38 mg/dL    Comment: Performed at [facility] [address] [place], [place] [address]  Magnesium     Status: None   Collection Time: 02/12/21  2:13 AM  Result Value Ref Range   Magnesium 1.7 1.7 - 2.4 mg/dL    Comment: Performed at [facility] [address] [place], [place] [address]  Phosphorus     Status: None   Collection Time: 02/12/21  2:13 AM  Result Value Ref Range   Phosphorus 3.4 2.5 - 4.6 mg/dL    Comment: Performed at [facility] [address] [place],  [address]    DG Lumbar Spine 2-3 Views  Result Date: 02/10/2021 CLINICAL DATA:  Kyphoplasty EXAM: LUMBAR SPINE - 2-3 VIEW COMPARISON:  MRI 02/07/2021 FINDINGS: Single low resolution intraoperative spot view of the lumbar spine. Total fluoroscopy time was 2 minutes 28 seconds. The image demonstrates vertebral augmentation at L5. IMPRESSION: Intraoperative fluoroscopic assistance provided during kyphoplasty Electronically Signed   By: Donavan Foil M.D.   On: 02/10/2021 16:15   DG C-Arm 1-60 Min-No Report  Result Date: 02/10/2021 Fluoroscopy was utilized by the requesting physician.  No radiographic interpretation.       Medical Problem List and Plan: 1. 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  -patient may *** shower  -ELOS/Goals: *** 2.  Antithrombotics: -DVT/anticoagulation:  Pharmaceutical: Other (comment) Eliquis  -antiplatelet therapy: none 3. Pain Management: oxycodone IR 10 mg every 4 hours; Tylenol 650 mg every 6 hours; Flexeril 10 mg TID PRN 4. Mood: LCSW to provide support  -antipsychotic agents: n/a 5. Neuropsych: This patient is capable of making decisions on his own behalf. 6. Skin/Wound Care: incision care:dry dressing and monitor; routine skin checks 7. Fluids/Electrolytes/Nutrition: routine ins and outs with follow-up chemistries 8. Persistent atrial fibrillation: rate controlled; continue apixaban 9. Dyslipidemia: continue Crestor 10 mg daily, heart healthy diet 10: History of prostate cancer; no evidence currently of metastases; completed  radiation therapy and hormonal therapy [facility]). Continue calcitonin, Flomax 11: mild normocytic, normochromic anemia: monitor CBC  Barbie Banner, PA-C  02/12/2021

## 2021-02-12 NOTE — Progress Notes (Signed)
Nurse report called to [facility] staff nurse.

## 2021-02-13 ENCOUNTER — Encounter (HOSPITAL_COMMUNITY): Payer: Self-pay | Admitting: Physical Medicine and Rehabilitation

## 2021-02-13 ENCOUNTER — Other Ambulatory Visit: Payer: Self-pay

## 2021-02-13 LAB — COMPREHENSIVE METABOLIC PANEL
ALT: 15 U/L (ref 0–44)
AST: 20 U/L (ref 15–41)
Albumin: 2.8 g/dL — ABNORMAL LOW (ref 3.5–5.0)
Alkaline Phosphatase: 78 U/L (ref 38–126)
Anion gap: 5 (ref 5–15)
BUN: 17 mg/dL (ref 8–23)
CO2: 27 mmol/L (ref 22–32)
Calcium: 10.2 mg/dL (ref 8.9–10.3)
Chloride: 98 mmol/L (ref 98–111)
Creatinine, Ser: 0.85 mg/dL (ref 0.61–1.24)
GFR, Estimated: >60 mL/min (ref >60)
Glucose, Bld: 111 mg/dL — ABNORMAL HIGH (ref 70–99)
Potassium: 4.4 mmol/L (ref 3.5–5.1)
Sodium: 130 mmol/L — ABNORMAL LOW (ref 135–145)
Total Bilirubin: 0.9 mg/dL (ref 0.3–1.2)
Total Protein: 5.4 g/dL — ABNORMAL LOW (ref 6.5–8.1)

## 2021-02-13 LAB — CBC WITH DIFFERENTIAL/PLATELET
Abs Immature Granulocytes: 0.11 10*3/uL — ABNORMAL HIGH (ref 0.00–0.07)
Basophils Absolute: 0 10*3/uL (ref 0.0–0.1)
Basophils Relative: 1 %
Eosinophils Absolute: 0.2 10*3/uL (ref 0.0–0.5)
Eosinophils Relative: 5 %
HCT: 29.4 % — ABNORMAL LOW (ref 39.0–52.0)
Hemoglobin: 9.8 g/dL — ABNORMAL LOW (ref 13.0–17.0)
Immature Granulocytes: 3 %
Lymphocytes Relative: 12 %
Lymphs Abs: 0.5 10*3/uL — ABNORMAL LOW (ref 0.7–4.0)
MCH: 30.9 pg (ref 26.0–34.0)
MCHC: 33.3 g/dL (ref 30.0–36.0)
MCV: 92.7 fL (ref 80.0–100.0)
Monocytes Absolute: 0.8 10*3/uL (ref 0.1–1.0)
Monocytes Relative: 18 %
Neutro Abs: 2.7 10*3/uL (ref 1.7–7.7)
Neutrophils Relative %: 61 %
Platelets: 203 10*3/uL (ref 150–400)
RBC: 3.17 MIL/uL — ABNORMAL LOW (ref 4.22–5.81)
RDW: 13.3 % (ref 11.5–15.5)
WBC: 4.3 10*3/uL (ref 4.0–10.5)
nRBC: 0 % (ref 0.0–0.2)

## 2021-02-13 LAB — TESTOSTERONE: Testosterone: <3 ng/dL — ABNORMAL LOW (ref 264–916)

## 2021-02-13 LAB — CALCIUM, IONIZED: Calcium, Ionized, Serum: 5.9 mg/dL — ABNORMAL HIGH (ref 4.5–5.6)

## 2021-02-13 LAB — PTH, INTACT AND CALCIUM
Calcium, Total (PTH): 9.1 mg/dL (ref 8.6–10.2)
PTH: 37 pg/mL (ref 15–65)

## 2021-02-13 LAB — CALCITRIOL (1,25 DI-OH VIT D): Vit D, 1,25-Dihydroxy: 39.3 pg/mL (ref 24.8–81.5)

## 2021-02-13 LAB — SEX HORMONE BINDING GLOBULIN: Sex Hormone Binding: 61.5 nmol/L (ref 19.3–76.4)

## 2021-02-13 MED ORDER — MORPHINE SULFATE ER 15 MG PO TBCR: 15.0000 mg | EXTENDED_RELEASE_TABLET | Freq: Two times a day (BID) | ORAL | Status: DC

## 2021-02-13 MED ORDER — OXYCODONE HCL ER 15 MG PO T12A
15.0000 mg | EXTENDED_RELEASE_TABLET | Freq: Two times a day (BID) | ORAL | Status: DC
  Administered 2021-02-13 – 2021-02-21 (×17): 15 mg via ORAL
  Filled 2021-02-13 (×17): qty 1

## 2021-02-13 NOTE — Evaluation (Signed)
Occupational Therapy Assessment and Plan  Patient Details  Name: Todd Chapman MRN: [id number] Date of Birth: 1946/01/05  OT Diagnosis: acute pain and muscle weakness (generalized) Rehab Potential: Rehab Potential (ACUTE ONLY): Good ELOS: ~ 6-9 days   Today's Date: 02/13/2021 OT Individual Time: 1300-1400 OT Individual Time Calculation (min): 60 min     Hospital Problem: Principal Problem:   Vertebral compression fracture ([place])   Past Medical History:  Past Medical History:  Diagnosis Date   Arthritis    "thumbs, back" (02/16/2017)   Bradycardia 02/2017   Dyspnea    with exertion   Flu 2 weeks ago   GERD (gastroesophageal reflux disease)    Hepatitis A as child   HLD (hyperlipidemia)    Persistent atrial fibrillation ([place]) 12/18/2016   Pneumonia 1999; 2018   Presence of permanent cardiac pacemaker    st jude   Prostate cancer [facility])    Past Surgical History:  Past Surgical History:  Procedure Laterality Date   APPENDECTOMY     age 16   CARDIOVERSION  02/16/2017   CARDIOVERSION N/A 02/16/2017   Procedure: CARDIOVERSION;  Surgeon: Thompson Grayer, MD;  Location: [facility];  Service: Cardiovascular;  Laterality: N/A;   COLONOSCOPY WITH PROPOFOL N/A 12/13/2012   Procedure: COLONOSCOPY WITH PROPOFOL;  Surgeon: Garlan Fair, MD;  [facility];  Service: Endoscopy;  Laterality: N/A;   FRACTURE SURGERY     HYDROCELE EXCISION Left 05/16/2018   Procedure: HYDROCELECTOMY ADULT;  Surgeon: [name], MD;  Location: [facility];  Service: Urology;  Laterality: Left;   INSERT / REPLACE / REMOVE PACEMAKER  02/16/2017   KYPHOPLASTY N/A 02/10/2021   Procedure: KYPHOPLASTY L-5;  Surgeon: Newman Pies, MD;  Location: [place];  Service: Neurosurgery;  Laterality: N/A;   LAPAROSCOPY N/A 06/04/2020   Procedure: POSSIBLE LAPAROTOMY POSSIBLE BOWEL RESSECTION;  Surgeon: Clovis Riley, MD;  [facility];  Service: General;  Laterality: N/A;    PACEMAKER IMPLANT N/A 02/16/2017   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 year old male  initially presented to the emergency department with acute onset of mid lumbar back pain on 02/06/2021.  A new inferior endplate compression deformity involving L5 vertebral body was noted.  No cauda equina syndrome noted.  Family medicine was contacted for admission.  Neurosurgery was consulted. He underwent L5 kyphoplasty with vertebral body biopsies by Dr. Arnoldo Morale on February 10, 2021.  Because of his persistent low back pain, orthopedic surgery was consulted.  MRI of the pelvis was performed and revealed bilateral acetabular/supra acetabular stress fractures in the pelvis.  These findings were not felt to be clinically significant or would preclude progression to weightbearing.  Metabolic bone disease work-up in progress. He will need inpatient rehab therapies for decreased functional mobility due to acute on chronic back pain from compression and stress fractures.   He is interviewed on the orthopedic floor where he is out of bed to chair complaining of mild right lower back pain.  He states he is able to ambulate with rolling walker but cannot balance himself without support.  He is tolerating his diet and voiding spontaneously.   Local history significant for prostate cancer status post radiation therapy treatment and hormone suppressive therapy.  No known metastasis.  Prior history of right elbow fracture and left wrist fracture. Past surgical history significant for diagnostic laparoscopy with lysis of adhesive band on 06/04/2020 secondary to small bowel obstruction. Past medical history is significant for persistent atrial fibrillation on chronic apixaban therapy.  Status post pacemaker placement.   History of dyslipidemia.  He is not diabetic.  No history of coronary artery disease.   He lives with his wife in a 3 story town home. 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  Patient transferred to CIR on 02/12/2021 .    Patient currently requires min with basic self-care skills and basic mobility   secondary to muscle weakness and acute pain , decreased cardiorespiratoy endurance, and decreased sitting balance, decreased standing balance, decreased balance strategies, and difficulty maintaining precautions.  Prior to hospitalization, patient could complete ADL with modified independent .  Patient will benefit from skilled intervention to decrease level of assist with basic self-care skills and increase independence with basic self-care skills prior to discharge home with care partner.  Anticipate patient will require intermittent supervision and follow up home health.  OT - End of Session Activity Tolerance: Tolerates 30+ min activity with multiple rests Endurance Deficit: Yes OT Assessment Rehab Potential (ACUTE ONLY): Good OT Patient demonstrates impairments in the following area(s): Balance;Safety;Edema;Endurance;Motor;Pain OT Basic ADL's Functional Problem(s): Bathing;Dressing;Toileting OT Transfers Functional Problem(s): Toilet;Tub/Shower OT Additional Impairment(s): Fuctional Use of Upper Extremity OT Plan OT Intensity: Minimum of 1-2 x/day, 45 to 90 minutes OT Frequency: 5 out of 7 days OT Duration/Estimated Length of Stay: ~ 6-9 days OT Treatment/Interventions: Balance/vestibular training;Discharge planning;Pain management;Self Care/advanced ADL retraining;Therapeutic Activities;Functional mobility training;Patient/family education;Community reintegration;DME/adaptive equipment instruction;Neuromuscular re-education;Psychosocial support;UE/LE Strength taining/ROM;Therapeutic Exercise OT Self Feeding Anticipated Outcome(s): n/a OT Basic Self-Care  Anticipated Outcome(s): mod I OT Toileting Anticipated Outcome(s): mod I OT Bathroom Transfers Anticipated Outcome(s): mod I OT Recommendation Patient destination: Home Follow Up Recommendations: Home health OT Equipment Recommended: 3 in 1 bedside comode Equipment Details: already has a shower chair   OT Evaluation Precautions/Restrictions  Precautions Precautions: Back;Fall Restrictions Weight Bearing Restrictions: No Other Position/Activity Restrictions: Per Ortho consult, WBAT bil hips General Chart Reviewed: Yes Family/Caregiver Present: No Vital Signs Therapy Vitals Temp: 99.2 F (37.3 C) Pulse Rate: 91 Resp: 18 BP: 108/82 Patient Position (if appropriate): Sitting Oxygen Therapy SpO2: 96 % O2 Device: Room Air Pain Pain Assessment Pain Scale: 0-10 Pain Score: 7  Pain Type: Acute pain;Surgical pain Pain Location: Back Pain Orientation: Right Pain Descriptors / Indicators: Aching Home Living/Prior Functioning Home Living Family/patient expects to be discharged to:: Private residence Living Arrangements: Spouse/significant other Available Help at Discharge: Family Type of Home: House Home Access: Stairs to enter Technical brewer of Steps: 2 Entrance Stairs-Rails: None Home Layout: Multi-level, 1/2 bath on main level Alternate Level Stairs-Number of Steps: 14 Alternate Level Stairs-Rails: Right Bathroom Shower/Tub: Multimedia programmer: Handicapped height  Lives With: Spouse Prior Function Level of Independence: Independent with basic ADLs, Independent with gait, Independent with homemaking with ambulation, Independent with transfers (very recently needed Rollator)  Able to Take Stairs?: Yes Driving: Yes Vocation: Part time employment Vision Baseline Vision/History: 0 No visual deficits Ability to See in Adequate Light: 0 Adequate Patient Visual Report: No change from baseline Vision Assessment?: No apparent visual deficits Perception   Perception: Within Functional Limits Praxis Praxis: Intact Cognition Overall Cognitive Status: Within Functional Limits for tasks assessed Arousal/Alertness: Awake/alert Orientation Level: Person;Place;Situation Person: Oriented Place: Oriented Situation: Oriented Year: 2022 Month: December  Day of Week: Correct Memory: Appears intact Immediate Memory Recall: Sock;Blue;Bed Memory Recall Sock: Without Cue Memory Recall Blue: Without Cue Memory Recall Bed: Without Cue Attention: Selective Selective Attention: Appears intact Awareness: Impaired Awareness Impairment: Anticipatory impairment Problem Solving: Impaired Behaviors: Impulsive Safety/Judgment: Impaired Sensation Sensation Light Touch: Appears Intact Hot/Cold: Not tested Proprioception: Appears Intact Stereognosis: Appears Intact Coordination Gross Motor Movements are Fluid and Coordinated: No Fine Motor Movements are Fluid and Coordinated: Yes Heel Shin Test: limited AROM due to pain/weakness Motor  Motor Motor: Abnormal postural alignment and control Motor - Skilled Clinical Observations: general weakness, antalgic gait  Trunk/Postural Assessment  Cervical Assessment Cervical Assessment: Within Functional Limits Thoracic Assessment Thoracic Assessment:  (rounded shoulders) Lumbar Assessment Lumbar Assessment:  (posterior pelvic) Postural Control Postural Control: Deficits on evaluation Righting Reactions: delayed and inadequate Protective Responses: delayed and inadequate  Balance Balance Balance Assessed: Yes Standardized Balance Assessment Standardized Balance Assessment: Timed Up and Go Test Timed Up and Go Test TUG: Normal TUG Normal TUG (seconds): 110.43 Static Sitting Balance Static Sitting - Balance Support: Feet supported Static Sitting - Level of Assistance: 6: Modified independent (Device/Increase time) Dynamic Sitting Balance Dynamic Sitting - Balance Support: During functional  activity;Feet supported Dynamic Sitting - Level of Assistance: 5: Stand by assistance Static Standing Balance Static Standing - Balance Support: During functional activity;Bilateral upper extremity supported Static Standing - Level of Assistance: 4: Min assist Dynamic Standing Balance Dynamic Standing - Balance Support: During functional activity;Bilateral upper extremity supported Dynamic Standing - Level of Assistance: 4: Min assist Extremity/Trunk Assessment RUE Assessment RUE Assessment: Within Functional Limits LUE Assessment LUE Assessment: Within Functional Limits  Care Tool Care Tool Self Care Eating   Eating Assist Level: Independent    Oral Care    Oral Care Assist Level: Independent    Bathing   Body parts bathed by patient: Right arm;Left arm;Chest;Abdomen;Front perineal area;Buttocks;Right upper leg;Left upper leg;Right lower leg;Left lower leg;Face     Assist Level: Contact Guard/Touching assist    Upper Body Dressing(including orthotics)   What is the patient wearing?: Pull over shirt;Orthosis   Assist Level: Minimal Assistance - Patient > 75%    Lower Body Dressing (excluding footwear)   What is the patient wearing?: Underwear/pull up;Pants Assist for lower body dressing: Contact Guard/Touching assist    Putting on/Taking off footwear   What is the patient wearing?: Socks;Shoes Assist for footwear: Minimal Assistance - Patient > 75%       Care Tool Toileting Toileting activity   Assist for toileting: Contact Guard/Touching assist     Care Tool Bed Mobility Roll left and right activity   Roll left and right assist level: Supervision/Verbal cueing    Sit to lying activity   Sit to lying assist level: Supervision/Verbal cueing    Lying to sitting on side of bed activity   Lying to sitting on side of bed assist level: the ability to move from lying on the back to sitting on the side of the bed with no back support.: Supervision/Verbal cueing      Care Tool Transfers Sit to stand transfer   Sit to stand assist level: Contact Guard/Touching assist    Chair/bed transfer   Chair/bed transfer assist level: Contact Guard/Touching assist     Toilet transfer   Assist Level: Contact Guard/Touching assist     Care Tool Cognition  Expression of Ideas and Wants Expression of Ideas and Wants: 4. Without difficulty (complex and basic) - expresses complex messages without difficulty and with  speech that is clear and easy to understand  Understanding Verbal and Non-Verbal Content Understanding Verbal and Non-Verbal Content: 3. Usually understands - understands most conversations, but misses some part/intent of message. Requires cues at times to understand   Memory/Recall Ability Memory/Recall Ability : Current season;Location of own room;Staff names and faces;That he or she is in a hospital/hospital unit   Refer to Care Plan for [place] 1 OT Short Term Goal 1 (Week 1): STG=LTG  Recommendations for other services: None    Skilled Therapeutic Intervention 1:1 Eval initiated with goals, purpose and purpose of OT discussed. Pt reports he lives at a level 7 pain and was able to continue with therapy with rest breaks as needed. ADL retraining at shower level. Pt ambulated to the bathroom with RW with min A to the toilet with BSC over the commode. Pt does heavily rely on his UE s to offset the pain in his back and right hip. Pt did need frequent cues to adhere to back precautions (especially bending). Pt showered sit to stand and with long handled sponge intermittent with min A. Pt prefers to cross LEs over knee to wash feet- reports no extra pain when doing this. With setup Pt able to don orthosis and shirt with setup. Ambulated to the bed to don LB clothing. Pt declined use of reacher after shown how to use but was successful with donning underwear and pants with setup. Pt able to socks by crossing Les and A for shoes  due to time.     ADL ADL Eating: Independent Grooming: Independent Upper Body Bathing: Supervision/safety Lower Body Bathing: Supervision/safety Upper Body Dressing: Minimal assistance Lower Body Dressing: Minimal assistance Toileting: Minimal assistance Toilet Transfer: Minimal assistance Toilet Transfer Method: Ambulating Walk-In Shower Transfer: Minimal assistance Youth worker: Grab bars Mobility  Bed Mobility Bed Mobility: Rolling Right;Rolling Left;Supine to Sit;Sit to Supine Rolling Right: Supervision/verbal cueing Rolling Left: Supervision/Verbal cueing Supine to Sit: Supervision/Verbal cueing Sit to Supine: Supervision/Verbal cueing Transfers Sit to Stand: Minimal Assistance - Patient > 75% Stand to Sit: Minimal Assistance - Patient > 75%   Discharge Criteria: Patient will be discharged from OT if patient refuses treatment 3 consecutive times without medical reason, if treatment goals not met, if there is a change in medical status, if patient makes no progress towards goals or if patient is discharged from hospital.  The above assessment, treatment plan, treatment alternatives and goals were discussed and mutually agreed upon: by patient  Nicoletta Ba 02/13/2021, 2:46 PM

## 2021-02-13 NOTE — Plan of Care (Signed)
  Problem: RH Balance Goal: LTG Patient will maintain dynamic standing with ADLs (OT) Description: LTG:  Patient will maintain dynamic standing balance with assist during activities of daily living (OT)  Flowsheets (Taken 02/13/2021 1505) LTG: Pt will maintain dynamic standing balance during ADLs with: Independent with assistive device   Problem: Sit to Stand Goal: LTG:  Patient will perform sit to stand in prep for activites of daily living with assistance level (OT) Description: LTG:  Patient will perform sit to stand in prep for activites of daily living with assistance level (OT) Flowsheets (Taken 02/13/2021 1505) LTG: PT will perform sit to stand in prep for activites of daily living with assistance level: Independent with assistive device   Problem: RH Bathing Goal: LTG Patient will bathe all body parts with assist levels (OT) Description: LTG: Patient will bathe all body parts with assist levels (OT) Flowsheets (Taken 02/13/2021 1505) LTG: Pt will perform bathing with assistance level/cueing: Independent with assistive device    Problem: RH Dressing Goal: LTG Patient will perform upper body dressing (OT) Description: LTG Patient will perform upper body dressing with assist, with/without cues (OT). Flowsheets (Taken 02/13/2021 1505) LTG: Pt will perform upper body dressing with assistance level of: Independent with assistive device Goal: LTG Patient will perform lower body dressing w/assist (OT) Description: LTG: Patient will perform lower body dressing with assist, with/without cues in positioning using equipment (OT) Flowsheets (Taken 02/13/2021 1505) LTG: Pt will perform lower body dressing with assistance level of: Independent with assistive device   Problem: RH Toileting Goal: LTG Patient will perform toileting task (3/3 steps) with assistance level (OT) Description: LTG: Patient will perform toileting task (3/3 steps) with assistance level (OT)  Flowsheets (Taken 02/13/2021  1505) LTG: Pt will perform toileting task (3/3 steps) with assistance level: Independent with assistive device   Problem: RH Toilet Transfers Goal: LTG Patient will perform toilet transfers w/assist (OT) Description: LTG: Patient will perform toilet transfers with assist, with/without cues using equipment (OT) Flowsheets (Taken 02/13/2021 1505) LTG: Pt will perform toilet transfers with assistance level of: Independent with assistive device   Problem: RH Tub/Shower Transfers Goal: LTG Patient will perform tub/shower transfers w/assist (OT) Description: LTG: Patient will perform tub/shower transfers with assist, with/without cues using equipment (OT) Flowsheets (Taken 02/13/2021 1505) LTG: Pt will perform tub/shower stall transfers with assistance level of: Independent with assistive device

## 2021-02-13 NOTE — Progress Notes (Signed)
Inpatient Rehabilitation Care Coordinator Assessment and Plan Patient Details  Name: Todd Chapman MRN: [id number] Date of Birth: [birth year]  Today's Date: 02/13/2021  Hospital Problems: Principal Problem:   Vertebral compression fracture [facility])  Past Medical History:  Past Medical History:  Diagnosis Date   Arthritis    "thumbs, back" (02/16/2017)   Bradycardia 02/2017   Dyspnea    with exertion   Flu 2 weeks ago   GERD (gastroesophageal reflux disease)    Hepatitis A as child   HLD (hyperlipidemia)    Persistent atrial fibrillation ([place]) 12/18/2016   Pneumonia 1999; 2018   Presence of permanent cardiac pacemaker    st jude   Prostate cancer [facility])    Past Surgical History:  Past Surgical History:  Procedure Laterality Date   APPENDECTOMY     age 21   CARDIOVERSION  02/16/2017   CARDIOVERSION N/A 02/16/2017   Procedure: CARDIOVERSION;  Surgeon: Thompson Grayer, MD;  Location: [facility];  Service: Cardiovascular;  Laterality: N/A;   COLONOSCOPY WITH PROPOFOL N/A 12/13/2012   Procedure: COLONOSCOPY WITH PROPOFOL;  Surgeon: Garlan Fair, MD;  [facility];  Service: Endoscopy;  Laterality: N/A;   FRACTURE SURGERY     HYDROCELE EXCISION Left 05/16/2018   Procedure: HYDROCELECTOMY ADULT;  Surgeon: [name], MD;  Location: [facility];  Service: Urology;  Laterality: Left;   INSERT / REPLACE / REMOVE PACEMAKER  02/16/2017   KYPHOPLASTY N/A 02/10/2021   Procedure: KYPHOPLASTY L-5;  Surgeon: Newman Pies, MD;  Location: [place];  Service: Neurosurgery;  Laterality: N/A;   LAPAROSCOPY N/A 06/04/2020   Procedure: POSSIBLE LAPAROTOMY POSSIBLE BOWEL RESSECTION;  Surgeon: Clovis Riley, MD;  [facility];  Service: General;  Laterality: N/A;   PACEMAKER IMPLANT N/A 02/16/2017   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 by Dr Rayann Heman   PROSTATE BIOPSY  11/23/2019   WRIST  FRACTURE SURGERY Left    with pins   Social History:  reports that he quit smoking about 55 years ago. His smoking use included cigarettes. He has a 0.96 pack-year smoking history. He has never used smokeless tobacco. He reports current alcohol use. He reports that he does not use drugs.  Family / Support Systems Marital Status: Married How Long?: 33 years Patient Roles: Spouse, Parent Spouse/Significant Other: [name] (wife): [phone] Children: two adult children (from previous marriage)- son lives in [place]; dtr lives in [place]. Other Supports: children PRN Anticipated Caregiver: Wife Ability/Limitations of Caregiver: Wife reports she intends to work from home to provide care, however pt remain intermittent level of care. Caregiver Availability: Intermittent Family Dynamics: Pt lives with his wife and they both continue to work.  Social History Preferred language: English Religion: Protestant Cultural Background: Pt has worked as a Chief Executive Officer for 50 years. 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 Employment: 16 (years) Return to Work Plans: Human resources officer Issues: Denies Guardian/Conservator: N/A   Abuse/Neglect Abuse/Neglect Assessment Can Be Completed: Yes Physical Abuse: Denies Verbal Abuse: Denies Sexual Abuse: Denies Exploitation of patient/patient's resources: Denies Self-Neglect: Denies Possible abuse reported to:: Other (Comment) (None)  Patient response to: Social Isolation - How often do you feel lonely or isolated from those around you?: Never  Emotional Status  Pt's affect, behavior and adjustment status: Pt in good spirits at time of visit Recent Psychosocial Issues: Denies Psychiatric  History: Denies Substance Abuse History: Pt admits he quit smoing cigarettes 4 years ago because he did not like it anymore. EtoH- atleast 5xs per week. Denies rec drug use.  Patient / Family Perceptions, Expectations & Goals Pt/Family understanding of illness & functional limitations: Pt and family has a general understanding of pt care needs Premorbid pt/family roles/activities: Independent Anticipated changes in roles/activities/participation: Assistance with ADLs/IADLs Pt/family expectations/goals: Pt gfoal si to work on "physical strength due to job duties, and remvign pain in hip.Marland KitchenMarland KitchenI would like ot be strong and live an active life."  [employer]: None Premorbid Home Care/DME Agencies: None Transportation available at discharge: Wife Is the patient able to respond to transportation needs?: Yes In the past 12 months, has lack of transportation kept you from medical appointments or from getting medications?: No  Discharge Planning Living Arrangements: Spouse/significant other Support Systems: Spouse/significant other Type of Residence: Private residence Insurance Resources: [employer], Multimedia programmer (specify) (BCBS Supplement) Financial Resources: Employment Financial Screen Referred: No Living Expenses: Own Money Management: Patient, Spouse Does the patient have any problems obtaining your medications?: No Home Management: Pt reports he and wife both manage home care needs Patient/Family Preliminary Plans: TBD Care Coordinator Barriers to Discharge: Decreased caregiver support, Lack of/limited family support Care Coordinator Barriers to Discharge Comments: 2 flights of stairs to bedrooms on 3rd floor; wife will fully work from home Care Coordinator Anticipated Follow Up Needs: [place] Additional Notes/Comments: pt will need to be intermittent support at d/c  Clinical Impression SW met with pt in room to introduce self, explain role, and  discuss discharge process. 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 Pt wife [name] is his HCPOA. DME: high rise toilets seats in home and SPC. Pt aware SW to follow-up with his wife.   1421-SW spoke with pt wife [name] to introduce self, explain role, and discuss discharge process. Reports she will now be working from home full time. She will only have short breaks in between therapy sessions, no more than 5-7 minutes. SW informed will continue to provide updates as they are available and will update after team conference on Tuesday.   Rana Snare 02/13/2021, 3:34 PM

## 2021-02-13 NOTE — Progress Notes (Signed)
Physical Therapy Session Note  Patient Details  Name: Todd Chapman MRN: [id number] Date of Birth: [birth year]  Today's Date: 02/13/2021 PT Individual Time: 1435-1545 PT Individual Time Calculation (min): 70 min   Short Term Goals: Week 1:  PT Short Term Goal 1 (Week 1): Patient will recall 3/3 back precautions with no additional cuing PT Short Term Goal 2 (Week 1): Patient will complete bed <> wc transfer with LRAD and CGA PT Short Term Goal 3 (Week 1): Patient will ambulate >47f with LRAD and CGA PT Short Term Goal 4 (Week 1): Patient will initiate stair training  Skilled Therapeutic Interventions/Progress Updates: Pt presented in recliner agreeable to therapy. Pt states pain 7/10 which is pt's baseline however requesting pain meds knowing will have increased pain during session. Pt inquiring about pain meds with PTA explaining per MD note currently on PRN 4hr oxycodone and BID OxyContin, PTA deferred to nsg details and pain meds received at end of session. PTA providing rest breaks as needed. Pt then performed Sit to stand from recliner with CGA and ambulated towards nsg station ~339fwith RW and CGA. Pt noted to have forward flexed posture and heavy reliance on BUE when ambulating. At seated rest PTA adjusted RW and pt was able to ambulate additional 1087fith improved posture but continued to increase push through with BUE when advancing LLE. Pt transported remaining distance  to rehab gym and performed stand pivot to mat. Pt asking about LOS in regards that has mediation on Thursday and unsure if should reschedule or such. Explained that current anticipated LOS 10-12 days which would put past Thursday and pt indicating may still be on significant pain meds. Pt with enough insight that indicated should have assistant perform mediation vs himself. Pt then participated in toe taps to 4in block for weight shifting and BLE strengthening. Pt also participated in Sit to stand from elevated mat ~25in 2  x 5 without AD for forced use of BUE. Pt then ambulated additional [id number] RW then took seated rest and propelled w/c using BLE to [facility] station. Pt was then able to ambulate from nsg station back to recliner with RW and w/c follow and CGA. Pt returned to recliner at end of session and requesting to doff LSO, educated pt that will need to place back on when prepared to transfer back to bed. Pt verbalized understanding. Pt left in recliner at end of session with seat alarm on, call bell within reach and needs met.      Therapy Documentation Precautions:  Precautions Precautions: Back, Fall Precaution Booklet Issued: Yes (comment) Restrictions Weight Bearing Restrictions: No Other Position/Activity Restrictions: Per Ortho consult, WBAT bil hips General:   Vital Signs: Therapy Vitals Temp: 99.2 F (37.3 C) Pulse Rate: 91 Resp: 18 BP: 108/82 Patient Position (if appropriate): Sitting Oxygen Therapy SpO2: 96 % O2 Device: Room Air Pain: Pain Assessment Pain Scale: 0-10 Pain Score: 8  Pain Type: Acute pain;Surgical pain Pain Location: Back Pain Orientation: Right Pain Descriptors / Indicators: Aching Pain Frequency: Constant Pain Onset: On-going Patients Stated Pain Goal: 3 Pain Intervention(s): Medication (See eMAR) Multiple Pain Sites: No Mobility: Transfers Transfers: Sit to Stand;Stand to Sit;Stand Pivot Transfers Sit to Stand: Minimal Assistance - Patient > 75% Stand to Sit: Minimal Assistance - Patient > 75% Stand Pivot Transfers: Minimal Assistance - Patient > 75% Stand Pivot Transfer Details: Verbal cues for safe use of DME/AE;Verbal cues for precautions/safety;Verbal cues for gait pattern Transfer (Assistive device): Rolling walker Locomotion :  Trunk/Postural Assessment : Cervical Assessment Cervical Assessment: Within Functional Limits Thoracic Assessment Thoracic Assessment:  (rounded shoulders) Lumbar Assessment Lumbar Assessment:  (posterior  pelvic) Postural Control Righting Reactions: delayed and inadequate Protective Responses: delayed and inadequate  Balance: Balance Balance Assessed: Yes Static Sitting Balance Static Sitting - Level of Assistance: 6: Modified independent (Device/Increase time) Dynamic Sitting Balance Dynamic Sitting - Balance Support: During functional activity;Feet supported Dynamic Sitting - Level of Assistance: 5: Stand by assistance Static Standing Balance Static Standing - Balance Support: During functional activity;Bilateral upper extremity supported Static Standing - Level of Assistance: 4: Min assist Dynamic Standing Balance Dynamic Standing - Balance Support: During functional activity;Bilateral upper extremity supported Dynamic Standing - Level of Assistance: 4: Min assist Exercises:   Other Treatments:      Therapy/Group: Individual Therapy  Taina Landry 02/13/2021, 4:27 PM

## 2021-02-13 NOTE — H&P (Signed)
Physical Medicine and Rehabilitation Admission H&P  CC: Vertebral compression fracture  HPI: Todd Chapman is a 75 year old male who initially presented to the emergency department with acute onset of mid lumbar back pain on 02/06/2021.  A new inferior endplate compression deformity involving L5 vertebral body was noted.  No cauda equina syndrome noted.  Family medicine was contacted for admission.  Neurosurgery was consulted. He underwent L5 kyphoplasty with vertebral body biopsies by Dr. Arnoldo Morale on February 10, 2021.  Because of his persistent low back pain, orthopedic surgery was consulted.  MRI of the pelvis was performed and revealed bilateral acetabular/supra acetabular stress fractures in the pelvis.  These findings were not felt to be clinically significant or would preclude progression to weightbearing.  Metabolic bone disease work-up in progress. He will need inpatient rehab therapies for decreased functional mobility due to acute on chronic back pain from compression and stress fractures.  He is interviewed on the orthopedic floor where he is out of bed to chair complaining of mild right lower back pain.  He states he is able to ambulate with rolling walker but cannot balance himself without support.  He is tolerating his diet and voiding spontaneously.  Local history significant for prostate cancer status post radiation therapy treatment and hormone suppressive therapy.  No known metastasis. Prior history of right elbow fracture and left wrist fracture. Past surgical history significant for diagnostic laparoscopy with lysis of adhesive band on 06/04/2020 secondary to small bowel obstruction. Past medical history is significant for persistent atrial fibrillation on chronic apixaban therapy.  Status post pacemaker placement.  History of dyslipidemia.  He is not diabetic.  No history of coronary artery disease.  He lives with his wife in a 3 story town home. 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   Next of kin: wife, Todd Chapman  Currently received the good news by phone that his L5 biopsy shows no cancer!  Review of Systems  Respiratory:  Negative for shortness of breath.   Gastrointestinal:  Negative for nausea and vomiting.  Musculoskeletal:  Positive for back pain.  Past Medical History:  Diagnosis Date   Arthritis    "thumbs, back" (02/16/2017)   Bradycardia 02/2017   Dyspnea    with exertion   Flu 2 weeks ago   GERD (gastroesophageal reflux disease)    Hepatitis A as child   HLD (hyperlipidemia)    Persistent atrial fibrillation ([place]) 12/18/2016   Pneumonia 1999; 2018   Presence of permanent cardiac pacemaker    st jude   Prostate cancer [facility])    Past Surgical History:  Procedure Laterality Date   APPENDECTOMY     age 68   CARDIOVERSION  02/16/2017   CARDIOVERSION N/A 02/16/2017   Procedure: CARDIOVERSION;  Surgeon: Thompson Grayer, MD;  Location: [place] [facility];  Service: Cardiovascular;  Laterality: N/A;   COLONOSCOPY WITH PROPOFOL N/A 12/13/2012   Procedure: COLONOSCOPY WITH PROPOFOL;  Surgeon: Garlan Fair, MD;  Location: [facility];  Service: Endoscopy;  Laterality: N/A;   FRACTURE SURGERY     HYDROCELE EXCISION Left 05/16/2018   Procedure: HYDROCELECTOMY ADULT;  Surgeon: [name], MD;  Location: [facility];  Service: Urology;  Laterality: Left;   INSERT / REPLACE / REMOVE PACEMAKER  02/16/2017   KYPHOPLASTY N/A 02/10/2021   Procedure: KYPHOPLASTY L-5;  Surgeon: Newman Pies, MD;  Location: [place];  Service: Neurosurgery;  Laterality: N/A;   LAPAROSCOPY N/A 06/04/2020  Procedure: POSSIBLE LAPAROTOMY POSSIBLE BOWEL RESSECTION;  Surgeon: Clovis Riley, MD;  Location: [facility];  Service: General;  Laterality: N/A;   PACEMAKER IMPLANT N/A 02/16/2017   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 His smoking use included cigarettes. He has a 0.96 pack-year smoking history. He has never used smokeless tobacco. He reports current alcohol use. He reports that he does not use drugs. 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     calcitonin, salmon, (MIACALCIN/FORTICAL) 200 UNIT/ACT nasal spray Place 1 spray into alternate nostrils daily. 3.7 mL 12   calcium carbonate (TUMS - DOSED IN MG ELEMENTAL CALCIUM) 500 MG chewable tablet Chew 2 tablets by mouth daily as needed for indigestion or heartburn.     ELIQUIS 5 MG TABS tablet TAKE 1 TABLET(5 MG) BY MOUTH TWICE DAILY 180 tablet 1   furosemide (LASIX) 20 MG tablet Take 20 mg by mouth daily.     Multiple Vitamin (MULTIVITAMIN WITH MINERALS) TABS tablet Take 1 tablet by mouth daily.     oxyCODONE 10 MG TABS Take 1 tablet (10 mg total) by mouth every 4 (four) hours. 30 tablet 0   pantoprazole (PROTONIX) 40 MG tablet Take 40 mg daily by mouth.  2   polyethylene glycol (MIRALAX / GLYCOLAX) 17 g packet Take 17 g by mouth daily. 14 each 0   Propylene Glycol (SYSTANE BALANCE OP) Place 1 drop into both eyes daily.      rosuvastatin (CRESTOR) 10 MG tablet Take 10 mg by mouth every morning.     sodium chloride (OCEAN) 0.65 % SOLN nasal spray Place 1 spray into both nostrils daily as needed for congestion.     tamsulosin (FLOMAX) 0.4 MG CAPS capsule TAKE 1 CAPSULE(0.4 MG) BY MOUTH DAILY AFTER AND SUPPER 30 capsule 5   trolamine salicylate (ASPERCREME) 10 % cream Apply 1 application topically daily.      Drug Regimen Review  Drug regimen was reviewed and remains appropriate with no significant issues identified  Home: Home Living Family/patient expects to be discharged to:: Private residence Living Arrangements: Spouse/significant other Available Help at Discharge: Family Type of Home: House Home Access: Stairs to enter Technical brewer of Steps: 2 Home Layout: Multi-level, Able to live on main level with bedroom/bathroom Alternate Level Stairs-Number of Steps: 14 Alternate Level Stairs-Rails: Right Bathroom Shower/Tub: Multimedia programmer: Handicapped height  Lives With: Spouse   Home: Home Living  Family/patient expects to be discharged to:: Private residence Living Arrangements: Spouse/significant other Available Help at Discharge: Family Type of Home: House Home Access: Stairs to enter Technical brewer of Steps: 2 Entrance Stairs-Rails: None Home Layout: Multi-level, Able to live on main level with bedroom/bathroom Alternate Level Stairs-Number of Steps: 14 Alternate Level Stairs-Rails: Right Bathroom Shower/Tub: Multimedia programmer: Handicapped height Home Equipment: Western - single point, Rollator (4 wheels)   Functional History: Prior Function Prior Level of Function : Independent/Modified Independent Mobility Comments: Has been using cane for ambulation ADLs Comments: independent   Functional Status:  Mobility: Bed Mobility Overal bed mobility: Modified Independent Bed Mobility: Rolling, Sidelying to Sit, Sit to Sidelying Rolling:  Supervision Sidelying to sit: Supervision Sit to sidelying: Supervision General bed mobility comments: Sitting EOB on arrival Transfers Overall transfer level: Needs assistance Equipment used: Rolling walker (2 wheels) Transfers: Sit to/from Stand Sit to Stand: Mod assist General transfer comment: Light mod assist to steady; very slow rise, pt dependent on UEs pushing up on RW to get to fully upright standing Ambulation/Gait Ambulation/Gait assistance: Mod assist Gait Distance (Feet): 9 Feet (6+3) Assistive device: Rolling walker (2 wheels) Gait Pattern/deviations: Decreased step length - right, Decreased step length - left General Gait Details: Employed step-by-step cues for gait today ("RW, R step, L step"), and that helped with pt doing more to advance the RW; very short steps, and still painful with standing, but less stiff, tense, and anxious than yesterday's session Gait velocity: very slow   ADL: ADL Overall ADL's : Needs assistance/impaired Eating/Feeding: Independent Grooming: Set up, Sitting Upper Body Bathing: Set up, Sitting Lower Body Bathing: Moderate assistance, Sitting/lateral leans Lower Body Bathing Details (indicate cue type and reason): educated in LB bathing leaning side to side in sitting Upper Body Dressing : Set up, Sitting Lower Body Dressing: Moderate assistance, Sit to/from stand, +2 for safety/equipment Lower Body Dressing Details (indicate cue type and reason): able to cross foot over opposite knee to don socks Toilet Transfer: Moderate assistance, +2 for physical assistance, +2 for safety/equipment, Ambulation, BSC/3in1, Rolling walker (2 wheels), Cueing for safety Toileting- Clothing Manipulation and Hygiene: Minimal assistance, Sit to/from stand Functional mobility during ADLs: Moderate assistance, Maximal assistance, +2 for physical assistance, +2 for safety/equipment, Cueing for safety, Rolling walker (2 wheels)   Cognition: Cognition Overall  Cognitive Status: Within Functional Limits for tasks assessed Orientation Level: Oriented X4 Cognition Arousal/Alertness: Awake/alert Behavior During Therapy: WFL for tasks assessed/performed Overall Cognitive Status: Within Functional Limits for tasks assessed  Physical Exam: Blood pressure (!) 149/111, pulse 96, temperature 97.9 F (36.6 C), resp. rate 18, height 6' 2.02" (1.88 m), weight 88.8 kg. Physical Exam Gen: no distress, normal appearing HEENT: oral mucosa pink and moist, NCAT Cardio: Reg rate Chest: normal effort, normal rate of breathing Abd: soft, non-distended Ext: no edema Psych: pleasant, normal affect, personable and pleasant Skin: incision with honeycomb dressing and steristrips- c/D/i Neuro: CN 2-12 intact. Alert and oriented x4.  Musculoskeletal: 5/5 strength, sensation intact.  Results for orders placed or performed during the hospital encounter of 02/12/21 (from the past 48 hour(s))  Comprehensive metabolic panel     Status: Abnormal   Collection Time: 02/13/21  5:17 AM  Result Value Ref Range   Sodium 130 (L) 135 - 145 mmol/L   Potassium 4.4 3.5 - 5.1 mmol/L   Chloride 98 98 - 111 mmol/L   CO2 27 22 - 32 mmol/L   Glucose, Bld 111 (H) 70 - 99 mg/dL  Comment: Glucose reference range applies only to samples taken after fasting for at least 8 hours.   BUN 17 8 - 23 mg/dL   Creatinine, Ser 0.85 0.61 - 1.24 mg/dL   Calcium 10.2 8.9 - 10.3 mg/dL   Total Protein 5.4 (L) 6.5 - 8.1 g/dL   Albumin 2.8 (L) 3.5 - 5.0 g/dL   AST 20 15 - 41 U/L   ALT 15 0 - 44 U/L   Alkaline Phosphatase 78 38 - 126 U/L   Total Bilirubin 0.9 0.3 - 1.2 mg/dL   GFR, Estimated >60 >60 mL/min    Comment: (NOTE) Calculated using the CKD-EPI Creatinine Equation (2021)    Anion gap 5 5 - 15    Comment: Performed at [facility], [place] [address], [place], [place] [address]  CBC WITH DIFFERENTIAL     Status: Abnormal   Collection Time: 02/13/21  5:17 AM  Result Value Ref Range    WBC 4.3 4.0 - 10.5 K/uL   RBC 3.17 (L) 4.22 - 5.81 MIL/uL   Hemoglobin 9.8 (L) 13.0 - 17.0 g/dL   HCT 29.4 (L) 39.0 - 52.0 %   MCV 92.7 80.0 - 100.0 fL   MCH 30.9 26.0 - 34.0 pg   MCHC 33.3 30.0 - 36.0 g/dL   RDW 13.3 11.5 - 15.5 %   Platelets 203 150 - 400 K/uL   nRBC 0.0 0.0 - 0.2 %   Neutrophils Relative % 61 %   Neutro Abs 2.7 1.7 - 7.7 K/uL   Lymphocytes Relative 12 %   Lymphs Abs 0.5 (L) 0.7 - 4.0 K/uL   Monocytes Relative 18 %   Monocytes Absolute 0.8 0.1 - 1.0 K/uL   Eosinophils Relative 5 %   Eosinophils Absolute 0.2 0.0 - 0.5 K/uL   Basophils Relative 1 %   Basophils Absolute 0.0 0.0 - 0.1 K/uL   Immature Granulocytes 3 %   Abs Immature Granulocytes 0.11 (H) 0.00 - 0.07 K/uL    Comment: Performed at [facility], [address], [place], [place] [address]   No results found.     Medical Problem List and Plan: 1. 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  -patient may shower but incision must be covered  -ELOS/Goals: 10-14 days S 2.  Antithrombotics: -DVT/anticoagulation:  Pharmaceutical: Other (comment) Eliquis  -antiplatelet therapy: none 3. Pain Management: oxycodone IR 10 mg every 4 hours; Tylenol 650 mg every 6 hours; Flexeril 10 mg TID PRN 4. Mood: LCSW to provide support  -antipsychotic agents: n/a 5. Neuropsych: This patient is capable of making decisions on his own behalf. 6. Skin/Wound Care: incision care:dry dressing and monitor; routine skin checks 7. Fluids/Electrolytes/Nutrition: routine ins and outs with follow-up chemistries 8. Persistent atrial fibrillation: rate controlled; continue apixaban 9. Dyslipidemia: continue Crestor 10 mg daily, heart healthy diet 10: History of prostate cancer; no evidence currently of metastases; completed radiation therapy and hormonal therapy [facility]). L5 biopsy with no evidence of metastasis. Continue  calcitonin, Flomax. Check vitamin D level tomorrow. 11: mild normocytic, normochromic anemia: monitor CBC, repeat tomorrow 12 Cancer-related fatigue: discussed with him over the counter N-acetyl-cysteine 600mg  BID versus Modafinil and he prefers to start with the former- he will ask his wife to purchase and advised him to give medication to nurse for [place] to place patient may use own med order.   I have personally performed a face to  face diagnostic evaluation, including, but not limited to relevant history and physical exam findings, of this patient and developed relevant assessment and plan.  Additionally, I have reviewed and concur with the physician assistant's documentation above.  Risa Grill, PA  Izora Ribas, MD  02/13/2021

## 2021-02-13 NOTE — Progress Notes (Signed)
Subjective: The patient is alert and pleasant.  He is in no apparent distress.  He says he is "better".  He feels he is making good progress.  Objective: Vital signs in last 24 hours: Temp:  [97.9 F (36.6 C)-98.7 F (37.1 C)] 97.9 F (36.6 C) (12/08 0534) Pulse Rate:  [73-96] 96 (12/08 0534) Resp:  [16-18] 18 (12/08 0534) BP: (141-157)/(88-112) 149/111 (12/08 0534) SpO2:  [95 %-100 %] 100 % (12/07 2316) Weight:  [88.8 kg] 88.8 kg (12/08 0138) Estimated body mass index is 25.12 kg/m as calculated from the following:   Height as of this encounter: 6' 2.02" (1.88 m).   Weight as of this encounter: 88.8 kg.   Intake/Output from previous day: 12/07 0701 - 12/08 0700 In: -  Out: 700 [Urine:700] Intake/Output this shift: Total I/O In: 360 [P.O.:360] Out: 1250 [Urine:1250]  Physical exam the patient is alert and oriented.  His lower extremity strength is normal.  Lab Results: Recent Labs    02/11/21 0057 02/13/21 0517  WBC 5.2 4.3  HGB 10.6* 9.8*  HCT 33.2* 29.4*  PLT 194 203   BMET Recent Labs    02/12/21 0213 02/13/21 0517  NA 131* 130*  K 4.2 4.4  CL 98 98  CO2 24 27  GLUCOSE 115* 111*  BUN 16 17  CREATININE 0.80 0.85  CALCIUM 10.1 10.2    Studies/Results: No results found.  Assessment/Plan: Postop day #3: The patient is recovering well from his kyphoplasty.  We discussed his bone biopsy results, i.e. no evidence of cancer.  He seems to make making good progress in rehab.  I have answered all his questions.  I will plan to see him in follow-up in the office in a few weeks.  Please call if I can be of further assistance.  LOS: 1 day     [name] 02/13/2021, 10:56 AM     Patient ID: Todd Chapman, male   DOB: December 11, 1945, 75 y.o.   MRN: [id number]

## 2021-02-13 NOTE — Progress Notes (Signed)
Inpatient Rehabilitation  Patient information reviewed and entered into eRehab system by Raylan Hanton M. Yukio Bisping, M.A., CCC/SLP, PPS Coordinator.  Information including medical coding, functional ability and quality indicators will be reviewed and updated through discharge.    

## 2021-02-13 NOTE — Progress Notes (Signed)
  Plan: Will change Oxycodone 10 mg q4 hours to Oxycontin 15 mg BID Has hypersensitivity to Morphine- makes confused- will add to allergy list.

## 2021-02-13 NOTE — Discharge Instructions (Addendum)
Inpatient Rehab Discharge Instructions  Todd Chapman Discharge date and time:  02/21/2021  Activities/Precautions/ Functional Status: Activity: activity as tolerated Diet: regular diet Wound Care: keep wound clean and dry   Functional status:  ___ No restrictions     ___ Walk up steps independently _X__ 24/7 supervision/assistance   ___ Walk up steps with assistance ___ Intermittent supervision/assistance  __X_ Bathe/dress independently ___ Walk with walker     ___ Bathe/dress with assistance ___ Walk Independently    ___ Shower independently _X__ Walk with walker/supervision   _X__ Shower with assistance __X_ No alcohol   ___ Return to work/school ________   COMMUNITY REFERRALS UPON DISCHARGE:     Outpatient: PT                  Agency:[facility]                        Address: [place] [place], [place], [place] [address]                       Phone:[phone]           Appointment Date/Time:*Please expect follow-up within 7-10 business days to schedule your appointment. If you have not received follow-up, be sure to contact the site directly.*   Medical Equipment/Items Ordered:rolling walker                                                 Agency/Supplier:[employer] [phone]   Special Instructions: No driving smoking or alcohol   My questions have been answered and I understand these instructions. I will adhere to these goals and the provided educational materials after my discharge from the hospital.  Patient/Caregiver Signature _______________________________ Date __________  Clinician Signature _______________________________________ Date __________  Please bring this form and your medication list with you to all your follow-up doctor's appointments.        Information on my medicine - ELIQUIS (apixaban)  Why was Eliquis prescribed for you? Eliquis was prescribed for you to reduce the risk of a blood clot forming  that can cause a stroke if you have a medical condition called atrial fibrillation (a type of irregular heartbeat).  What do You need to know about Eliquis ? Take your Eliquis TWICE DAILY - one tablet in the morning and one tablet in the evening with or without food. If you have difficulty swallowing the tablet whole please discuss with your pharmacist how to take the medication safely.  Take Eliquis exactly as prescribed by your doctor and DO NOT stop taking Eliquis without talking to the doctor who prescribed the medication.  Stopping may increase your risk of developing a stroke.  Refill your prescription before you run out.  After discharge, you should have regular check-up appointments with your healthcare provider that is prescribing your Eliquis.  In the future your dose may need to be changed if your kidney function or weight changes by a significant amount or as you get older.  What do you do if you miss a dose? If you miss a dose, take it as soon as you remember on the same day and resume taking twice daily.  Do not take more than one dose of ELIQUIS at the same time to make up a missed dose.  Important  Safety Information A possible side effect of Eliquis is bleeding. You should call your healthcare provider right away if you experience any of the following: Bleeding from an injury or your nose that does not stop. Unusual colored urine (red or dark brown) or unusual colored stools (red or black). Unusual bruising for unknown reasons. A serious fall or if you hit your head (even if there is no bleeding).  Some medicines may interact with Eliquis and might increase your risk of bleeding or clotting while on Eliquis. To help avoid this, consult your healthcare provider or pharmacist prior to using any new prescription or non-prescription medications, including herbals, vitamins, non-steroidal anti-inflammatory drugs (NSAIDs) and supplements.  This website has more information on  Eliquis (apixaban): http://www.eliquis.com/eliquis/home

## 2021-02-13 NOTE — Evaluation (Signed)
Physical Therapy Assessment and Plan  Patient Details  Name: Todd Chapman MRN: [id number] Date of Birth: 1945/05/15  PT Diagnosis: Abnormal posture, Abnormality of gait, Difficulty walking, Low back pain, and Muscle weakness Rehab Potential: Good ELOS: 10-12 days   Today's Date: 02/13/2021 PT Individual Time: 1100-1200 PT Individual Time Calculation (min): 60 min    Hospital Problem: Principal Problem:   Vertebral compression fracture [facility])   Past Medical History:  Past Medical History:  Diagnosis Date   Arthritis    "thumbs, back" (02/16/2017)   Bradycardia 02/2017   Dyspnea    with exertion   Flu 2 weeks ago   GERD (gastroesophageal reflux disease)    Hepatitis A as child   HLD (hyperlipidemia)    Persistent atrial fibrillation ([place]) 12/18/2016   Pneumonia 1999; 2018   Presence of permanent cardiac pacemaker    st jude   Prostate cancer [facility])    Past Surgical History:  Past Surgical History:  Procedure Laterality Date   APPENDECTOMY     age 71   CARDIOVERSION  02/16/2017   CARDIOVERSION N/A 02/16/2017   Procedure: CARDIOVERSION;  Surgeon: Thompson Grayer, MD;  Location: [facility];  Service: Cardiovascular;  Laterality: N/A;   COLONOSCOPY WITH PROPOFOL N/A 12/13/2012   Procedure: COLONOSCOPY WITH PROPOFOL;  Surgeon: Garlan Fair, MD;  Location: [facility];  Service: Endoscopy;  Laterality: N/A;   FRACTURE SURGERY     HYDROCELE EXCISION Left 05/16/2018   Procedure: HYDROCELECTOMY ADULT;  Surgeon: [name], MD;  [facility];  Service: Urology;  Laterality: Left;   INSERT / REPLACE / REMOVE PACEMAKER  02/16/2017   KYPHOPLASTY N/A 02/10/2021   Procedure: KYPHOPLASTY L-5;  Surgeon: Newman Pies, MD;  Location: [facility];  Service: Neurosurgery;  Laterality: N/A;   LAPAROSCOPY N/A 06/04/2020   Procedure: POSSIBLE LAPAROTOMY POSSIBLE BOWEL RESSECTION;  Surgeon: Clovis Riley, MD;  Location: [facility];  Service: General;   Laterality: N/A;   PACEMAKER IMPLANT N/A 02/16/2017   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 year old male with PMH of afib (on eliquis with pacemaker), prostate cancer (treated with radiation), and aspiration PNA admitted on 02/06/21 to [facility] with radiating back pain with concern for malignant process.  ED course showed WBC 15 (in the setting of recent prednisone taper), HR 162, BP 149/112, resp 29, SpO2 100% on room air.  CT lumbar spine showed compression fracture at L5, supported by abd/pelvic CT.  Neurosurgery was consulted and pt underwent kyphoplasty on 12/5 per Dr. Arnoldo Morale.  MRI lumbar/pelvis showed bilateral acetabular stress fractures.  Orthopedics consulted and recommended pain management and WBAT BLE.  Pain currently controlled on PO medication.  Hospital course pain management and ABLA.  Therapy evaluations completed and pt was recommended for CIR.   Patient currently requires min with mobility secondary to muscle weakness, decreased cardiorespiratoy endurance, decreased problem solving and decreased safety awareness, and decreased sitting balance, decreased standing balance, decreased postural control, decreased balance strategies, and difficulty maintaining precautions.  Prior to hospitalization, patient was modified independent  with mobility and lived with Spouse in a House (townhouse (3 floors)) home.  Home access is 2Stairs to enter.  Patient will benefit from skilled PT intervention to maximize safe functional mobility, minimize fall  risk, and decrease caregiver burden for planned discharge home with intermittent assist.  Anticipate patient will benefit from follow up [place] at discharge.  PT - End of Session Activity Tolerance: Tolerates 30+ min activity with multiple  rests Endurance Deficit: Yes PT Assessment Rehab Potential (ACUTE/IP ONLY): Good PT Barriers to Discharge: Home environment access/layout;Decreased caregiver support PT Patient demonstrates impairments in the following area(s): Balance;Behavior;Edema;Endurance;Motor;Nutrition;Pain;Perception;Safety;Skin Integrity;Sensory PT Transfers Functional Problem(s): Bed Mobility;Bed to Chair;Car;Furniture PT Locomotion Functional Problem(s): Ambulation;Stairs PT Plan PT Intensity: Minimum of 1-2 x/day ,45 to 90 minutes PT Frequency: 5 out of 7 days PT Duration Estimated Length of Stay: 10-12 days PT Treatment/Interventions: Ambulation/gait training;Cognitive remediation/compensation;Discharge planning;DME/adaptive equipment instruction;Functional mobility training;Pain management;Psychosocial support;Splinting/orthotics;Therapeutic Activities;UE/LE Strength taining/ROM;Visual/perceptual remediation/compensation;Balance/vestibular training;Community reintegration;Disease management/prevention;Functional electrical stimulation;Neuromuscular re-education;Patient/family education;Skin care/wound management;Stair training;Therapeutic Exercise;UE/LE Coordination activities;Wheelchair propulsion/positioning PT Transfers Anticipated Outcome(s): ModI PT Locomotion Anticipated Outcome(s): supervision PT Recommendation Recommendations for Other Services: Therapeutic Recreation consult Therapeutic Recreation Interventions: Stress management Follow Up Recommendations: Home health PT;Outpatient PT Patient destination: Home Equipment Recommended: To be determined   PT Evaluation Precautions/Restrictions Restrictions Weight Bearing Restrictions: No General   Vital Signs Pain Pain Assessment Pain Scale: 0-10 Pain Score: 7  Pain Type: Acute pain;Surgical pain Pain Location: Back Pain Orientation: Right Pain Descriptors / Indicators: Aching Pain Frequency: Constant Pain Onset: On-going Patients Stated Pain  Goal: 3 Pain Intervention(s): Medication (See eMAR) Multiple Pain Sites: No Pain Interference Pain Interference Pain Effect on Sleep: 3. Frequently Pain Interference with Therapy Activities: 3. Frequently Pain Interference with Day-to-Day Activities: 4. Almost constantly Home Living/Prior Functioning   Vision/Perception  Vision - History Ability to See in Adequate Light: 0 Adequate Perception Perception: Within Functional Limits Praxis Praxis: Intact  Cognition Overall Cognitive Status: No family/caregiver present to determine baseline cognitive functioning (Patient with very poor insight into deficits and very poor safety awareness) Arousal/Alertness: Awake/alert Orientation Level: Oriented X4 Awareness: Impaired Problem Solving: Impaired Safety/Judgment: Impaired Sensation Sensation Light Touch: Appears Intact Hot/Cold: Not tested Proprioception: Appears Intact Stereognosis: Appears Intact Coordination Gross Motor Movements are Fluid and Coordinated: No Fine Motor Movements are Fluid and Coordinated: Yes Heel Shin Test: limited AROM due to pain/weakness Motor  Motor Motor: Abnormal tone;Abnormal postural alignment and control Motor - Skilled Clinical Observations: general weakness, antalgic gait   Trunk/Postural Assessment  Cervical Assessment Cervical Assessment: Within Functional Limits Thoracic Assessment Thoracic Assessment: Exceptions to [facility] (rounded shoulders) Lumbar Assessment Lumbar Assessment: Exceptions to [facility] (posterior pelvic tilt) Postural Control Postural Control: Deficits on evaluation Righting Reactions: delayed and inadequate Protective Responses: delayed and inadequate  Balance Balance Balance Assessed: Yes Standardized Balance Assessment Standardized Balance Assessment: Timed Up and Go Test Timed Up and Go Test TUG: Normal TUG Normal TUG (seconds): 110.43 Static Sitting Balance Static Sitting - Balance Support: Feet supported Static  Sitting - Level of Assistance: 5: Stand by assistance Dynamic Sitting Balance Dynamic Sitting - Balance Support: During functional activity;Feet supported Dynamic Sitting - Level of Assistance: 5: Stand by assistance Static Standing Balance Static Standing - Balance Support: During functional activity;Bilateral upper extremity supported Static Standing - Level of Assistance: 4: Min assist Dynamic Standing Balance Dynamic Standing - Balance Support: During functional activity;Bilateral upper extremity supported Dynamic Standing - Level of Assistance: 4: Min assist Extremity Assessment      RLE Assessment RLE Assessment: Exceptions to [facility] General Strength Comments: Grossly 4/5 LLE Assessment LLE Assessment: Exceptions to [facility] General Strength Comments: Grossly 4/5  Care Tool Care Tool Bed Mobility Roll left and right activity   Roll left  and right assist level: Supervision/Verbal cueing    Sit to lying activity   Sit to lying assist level: Supervision/Verbal cueing    Lying to sitting on side of bed activity   Lying to sitting on side of bed assist level: the ability to move from lying on the back to sitting on the side of the bed with no back support.: Supervision/Verbal cueing     Care Tool Transfers Sit to stand transfer   Sit to stand assist level: Supervision/Verbal cueing    Chair/bed transfer   Chair/bed transfer assist level: Minimal Assistance - Patient > 75%     Toilet transfer   Assist Level: Minimal Assistance - Patient > 75%    Car transfer Car transfer activity did not occur: Safety/medical concerns        Care Tool Locomotion Ambulation   Assist level: Minimal Assistance - Patient > 75% Assistive device: Walker-rolling Max distance: 20  Walk 10 feet activity   Assist level: Minimal Assistance - Patient > 75% Assistive device: Walker-rolling   Walk 50 feet with 2 turns activity Walk 50 feet with 2 turns activity did not occur: Safety/medical  concerns      Walk 150 feet activity Walk 150 feet activity did not occur: Safety/medical concerns      Walk 10 feet on uneven surfaces activity Walk 10 feet on uneven surfaces activity did not occur: Safety/medical concerns      Stairs Stair activity did not occur: Safety/medical concerns        Walk up/down 1 step activity Walk up/down 1 step or curb (drop down) activity did not occur: Safety/medical concerns      Walk up/down 4 steps activity Walk up/down 4 steps activity did not occur: Safety/medical concerns      Walk up/down 12 steps activity Walk up/down 12 steps activity did not occur: Safety/medical concerns      Pick up small objects from floor Pick up small object from the floor (from standing position) activity did not occur: Safety/medical concerns      Wheelchair Is the patient using a wheelchair?: No          Wheel 50 feet with 2 turns activity      Wheel 150 feet activity        Refer to Care Plan for Long Term Goals  SHORT TERM GOAL WEEK 1 PT Short Term Goal 1 (Week 1): Patient will recall 3/3 back precautions with no additional cuing PT Short Term Goal 2 (Week 1): Patient will complete bed <> wc transfer with LRAD and CGA PT Short Term Goal 3 (Week 1): Patient will ambulate >74f with LRAD and CGA PT Short Term Goal 4 (Week 1): Patient will initiate stair training  Recommendations for other services: Therapeutic Recreation  Stress management  Skilled Therapeutic Intervention Mobility Bed Mobility Bed Mobility: Rolling Right;Rolling Left;Supine to Sit;Sit to Supine Rolling Right: Supervision/verbal cueing Rolling Left: Supervision/Verbal cueing Supine to Sit: Supervision/Verbal cueing Sit to Supine: Supervision/Verbal cueing Transfers Transfers: Sit to Stand;Stand to Sit;Stand Pivot Transfers Sit to Stand: Minimal Assistance - Patient > 75% Stand to Sit: Minimal Assistance - Patient > 75% Stand Pivot Transfers: Minimal Assistance - Patient >  75% Stand Pivot Transfer Details: Verbal cues for safe use of DME/AE;Verbal cues for precautions/safety;Verbal cues for gait pattern Transfer (Assistive device): Rolling walker Locomotion  Gait Ambulation: Yes Gait Assistance: Minimal Assistance - Patient > 75% Gait Distance (Feet): 20 Feet Assistive device: Rolling walker Gait Assistance Details: Verbal cues  for safe use of DME/AE;Verbal cues for precautions/safety;Verbal cues for gait pattern;Verbal cues for technique Gait Gait: Yes Gait Pattern: Impaired Gait Pattern: Step-through pattern;Trunk flexed;Poor foot clearance - left;Poor foot clearance - right Gait velocity: very slow Stairs / Additional Locomotion Stairs: No Wheelchair Mobility Wheelchair Mobility: No   Discharge Criteria: Patient will be discharged from PT if patient refuses treatment 3 consecutive times without medical reason, if treatment goals not met, if there is a change in medical status, if patient makes no progress towards goals or if patient is discharged from hospital.  The above assessment, treatment plan, treatment alternatives and goals were discussed and mutually agreed upon: by patient  Debbora Dus 02/13/2021, 12:24 PM

## 2021-02-13 NOTE — Progress Notes (Signed)
Admitted late last night. Head to toe assessment done with Sadra (RN) validating initial skin assessment. Surgical incision to the lower back with honeycomb dressing and 2 strips. Edges well attached. No signs of infection noted. Pain medications given at midnight. Scheduled pain medication (Oxycodone 10 mg) for 4 am was not given by the clock as patient was sleeping. Pain meds given after 5 am when patient was awake. Patient was not happy but reassured and pain administration plan going forward (night shift) will be adhered to. Day shift staff to be informed. Will order breakfast later today. Care tools initiated. Patient eagerly awaiting OT and PT assessment. Safety maintained at all times.

## 2021-02-13 NOTE — Plan of Care (Signed)
  Problem: RH Balance Goal: LTG Patient will maintain dynamic sitting balance (PT) Description: LTG:  Patient will maintain dynamic sitting balance with assistance during mobility activities (PT) Flowsheets (Taken 02/13/2021 1213) LTG: Pt will maintain dynamic sitting balance during mobility activities with:: Independent with assistive device  Goal: LTG Patient will maintain dynamic standing balance (PT) Description: LTG:  Patient will maintain dynamic standing balance with assistance during mobility activities (PT) Flowsheets (Taken 02/13/2021 1213) LTG: Pt will maintain dynamic standing balance during mobility activities with:: Independent with assistive device    Problem: Sit to Stand Goal: LTG:  Patient will perform sit to stand with assistance level (PT) Description: LTG:  Patient will perform sit to stand with assistance level (PT) Flowsheets (Taken 02/13/2021 1213) LTG: PT will perform sit to stand in preparation for functional mobility with assistance level: Independent with assistive device   Problem: RH Bed Mobility Goal: LTG Patient will perform bed mobility with assist (PT) Description: LTG: Patient will perform bed mobility with assistance, with/without cues (PT). Flowsheets (Taken 02/13/2021 1213) LTG: Pt will perform bed mobility with assistance level of: Independent with assistive device    Problem: RH Bed to Chair Transfers Goal: LTG Patient will perform bed/chair transfers w/assist (PT) Description: LTG: Patient will perform bed to chair transfers with assistance (PT). Flowsheets (Taken 02/13/2021 1213) LTG: Pt will perform Bed to Chair Transfers with assistance level: Independent with assistive device    Problem: RH Car Transfers Goal: LTG Patient will perform car transfers with assist (PT) Description: LTG: Patient will perform car transfers with assistance (PT). Flowsheets (Taken 02/13/2021 1213) LTG: Pt will perform car transfers with assist:: Supervision/Verbal  cueing   Problem: RH Ambulation Goal: LTG Patient will ambulate in controlled environment (PT) Description: LTG: Patient will ambulate in a controlled environment, # of feet with assistance (PT). Flowsheets (Taken 02/13/2021 1213) LTG: Pt will ambulate in controlled environ  assist needed:: Supervision/Verbal cueing LTG: Ambulation distance in controlled environment: 150 Goal: LTG Patient will ambulate in home environment (PT) Description: LTG: Patient will ambulate in home environment, # of feet with assistance (PT). Flowsheets (Taken 02/13/2021 1213) LTG: Pt will ambulate in home environ  assist needed:: Independent with assistive device LTG: Ambulation distance in home environment: 50   Problem: RH Stairs Goal: LTG Patient will ambulate up and down stairs w/assist (PT) Description: LTG: Patient will ambulate up and down # of stairs with assistance (PT) Flowsheets (Taken 02/13/2021 1213) LTG: Pt will ambulate up/down stairs assist needed:: Supervision/Verbal cueing LTG: Pt will  ambulate up and down number of stairs: 14 Note: R HR per home set up

## 2021-02-13 NOTE — Progress Notes (Signed)
Inpatient Rehabilitation Admission Medication Review by a Pharmacist  A complete drug regimen review was completed for this patient to identify any potential clinically significant medication issues.  High Risk Drug Classes Is patient taking? Indication by Medication  Antipsychotic No   Anticoagulant Yes Apixaban- AF  Antibiotic No   Opioid Yes Oxycodone- pain  Antiplatelet No   Hypoglycemics/insulin No   Vasoactive Medication Yes Lasix, flomax- hypertension, BPH  Chemotherapy No   Other Yes Protoni- GERD Crestor- HLD Miacalcin- osteoporosis / osteopenia     Type of Medication Issue Identified Description of Issue Recommendation(s)  Drug Interaction(s) (clinically significant)     Duplicate Therapy     Allergy     No Medication Administration End Date     Incorrect Dose     Additional Drug Therapy Needed     Significant med changes from prior encounter (inform family/care partners about these prior to discharge).    Other       Clinically significant medication issues were identified that warrant physician communication and completion of prescribed/recommended actions by midnight of the next day:  No   Time spent performing this drug regimen review (minutes):  30   Todd Chapman BS, PharmD, BCPS Clinical Pharmacist 02/13/2021 9:06 AM

## 2021-02-14 DIAGNOSIS — S32050G Wedge compression fracture of fifth lumbar vertebra, subsequent encounter for fracture with delayed healing: Secondary | ICD-10-CM | POA: Diagnosis not present

## 2021-02-14 LAB — TESTOSTERONE, FREE: Testosterone, Free: <0.2 pg/mL — ABNORMAL LOW (ref 6.6–18.1)

## 2021-02-14 NOTE — Progress Notes (Signed)
Physical Therapy Session Note  Patient Details  Name: Todd Chapman MRN: [id number] Date of Birth: December 16, 1945  Today's Date: 02/14/2021 PT Individual Time: 1224-8250 PT Individual Time Calculation (min): 43 min   Short Term Goals: Week 1:  PT Short Term Goal 1 (Week 1): Patient will recall 3/3 back precautions with no additional cuing PT Short Term Goal 2 (Week 1): Patient will complete bed <> wc transfer with LRAD and CGA PT Short Term Goal 3 (Week 1): Patient will ambulate >21ft with LRAD and CGA PT Short Term Goal 4 (Week 1): Patient will initiate stair training  Skilled Therapeutic Interventions/Progress Updates:    Patient received sitting up in recliner, agreeable to PT. He reports 7/10 pain primarily in R hip, premedicated. PT providing rest breaks, distractions and repositioning to assist with pain management. He requested to use bathroom- ambulatory transfer with RW and CGA. Patient electing to remain standing to void with CGA. Patient unable to void. Standing hand hygiene with CGA. Patient ambulating to therapy gym with RW and CGA, distance >165ft. Patient with forward flexed posture and increased reliance on UE on RW, but less so than evaluation. Patient negotiating x4 steps with B HR and CGA, then x4 steps with R HR per home set up. Patient reporting that to enter house he has 2 8" steps and then threshold, 14 6" steps with R HR to 2nd floor and "some 6", some 8" steps" to 3rd floor. Patient reporting that it's not necessary for him to go to 3rd floor of house immediately upon dc. Patient continues to require cues for basic safety and adhering to spinal precautions. Returning to recliner, chair alarm on, call light within reach.   Therapy Documentation Precautions:  Precautions Precautions: Back, Fall Precaution Booklet Issued: Yes (comment) Restrictions Weight Bearing Restrictions: No Other Position/Activity Restrictions: Per Ortho consult, WBAT bil hips   Therapy/Group:  Individual Therapy  Karoline Caldwell, PT, DPT, CBIS  02/14/2021, 7:26 AM

## 2021-02-14 NOTE — Progress Notes (Signed)
Patient ID: Todd Chapman, male   DOB: 1945-10-20, 75 y.o.   MRN: [id number]  SW provided pt with statement of service and resource for VA if he would like to pursue for VA disability.  SW called pt wife Todd Chapman to inform on ELOS and will follow-up once after team conference on Tuesday.  Loralee Pacas, MSW, [place] Office: [phone] Cell: [phone] Fax: [phone]

## 2021-02-14 NOTE — Progress Notes (Signed)
Occupational Therapy Session Note  Patient Details  Name: Todd Chapman MRN: [id number] Date of Birth: 1945/12/04  Today's Date: 02/14/2021 OT Individual Time: 1450-1535 OT Individual Time Calculation (min): 45 min    Short Term Goals: Week 1:  OT Short Term Goal 1 (Week 1): STG=LTG  Skilled Therapeutic Interventions/Progress Updates:  Skilled OT intervention completed with focus on functional ambulation, pain management education, education on back precautions, activity tolerance. Pt received seated in recliner, agreeable to session. Pt noted to be very talkative, with re-directive cues needed throughout session to attend to tasks. Pt with increased concern about pain medicines and how they effect him. Educated on other pain management strategies, with pt requesting ice at end of session to try. Pt sit > stand by pushing up on both thighs at [place], then using RW for stand pivot to w/c with CGA. Transported to gym with total A for time. Pt able to recall 2/3 back precautions correctly, with education provided on the difference between the three per pt request. Pt participated in functional ambulation task by retrieving horse shoe around the room, then ambulating back to chair to toss in bucket in order to promote increased activity tolerance needed for functional transfers as well as to increase recall of precautions during reaching. Pt able tolerate about 70 ft of ambulation using RW with CGA, before increased pain in back reported and seated break needed, then about 30 ft on second trial. Pt with decreased recall not to twist when reaching for item or when turning RW, with cues needed to adhere to precautions and methods of doing so. However, pt with good recall not to bend forward, unprompted. Transported pt in w/c with total A for time, back to room. Pt left seated in recliner, with chair alarm on and all needs in reach at end of session and NT in room changing linens- notified NT to provide pt with  ice pack for back pain.  Therapy Documentation Precautions:  Precautions Precautions: Back, Fall Precaution Booklet Issued: Yes (comment) Restrictions Weight Bearing Restrictions: No Other Position/Activity Restrictions: Per Ortho consult, WBAT bil hips  Pain: Unrated pain in low back/R hip, NT aware and in room to apply ice pack to back/hip at end of session   Therapy/Group: Individual Therapy  Aydan Levitz E Jaydence Arnesen 02/14/2021, 8:04 AM

## 2021-02-14 NOTE — Progress Notes (Signed)
Physical Therapy Session Note  Patient Details  Name: Todd Chapman MRN: [id number] Date of Birth: August 14, 1945  Today's Date: 02/14/2021 PT Individual Time: 0905-1020 and 4356-8616 PT Individual Time Calculation (min): 75 min and 30 min  Short Term Goals: Week 1:  PT Short Term Goal 1 (Week 1): Patient will recall 3/3 back precautions with no additional cuing PT Short Term Goal 2 (Week 1): Patient will complete bed <> wc transfer with LRAD and CGA PT Short Term Goal 3 (Week 1): Patient will ambulate >62ft with LRAD and CGA PT Short Term Goal 4 (Week 1): Patient will initiate stair training  Skilled Therapeutic Interventions/Progress Updates: Pt presented in bed agreeable to therapy. Pt states pain 4/10 has been premedicated. Per nsg pt noted to have significant spillage of urine with urinal use. Per pt positioning of bed and male urinal cause some difficulty. Pt educated on use of bed positioning buttons to flatten lower part of bed and PTA obtained male urinal to which pt seemed accepting to. Pt then doffed remaining clothes and donned brief and pants in supine with supervision. Performed supine to sit with supervision mostly maintaining spinal precautions and donned shirt with set up. Pt donned shoes however did not maintain spinal precautions, suggested using long handled shoes horn to which pt was receptive to. Pt then performed ambulatory transfer to sink for oral hygiene. Per pt was previously sitting at sink to brush teeth and shave and PTA advised best to continue this practice to maintain spinal precautions. Pt sat in w/c and performed oral hygiene mod I. Once completed pt ambulated to elevators ~140ft with CGA. Per pt pain increased with ambulation however improved with seated rest. Pt then ambulated to stairs at rehab gym with CGA. After brief rest performed stairs trianing at 3in step. Pt able to perform with CGA and step to pattern both ascending and descending. Pt then ambulated to mat  and participated in several short bouts of horseshoes decreasing UE reliance. Pt able to maintain fair static balance throughout. After seated rest pt ambulated back to room with x 1 standing rest. In room pt sat in recliner with LSO removed. Pt left in recliner at end of session with seat alarm on, call bell within reach and needs met.   Tx2: Pt presented in recliner asleep and requiring increased noxious stimulation to arouse. Once awake pt indicating that pain 4/10, discussed with pt use of PRN meds for pain management as pt stating meds make him sleepy. Adv if pain under control does not need to take additional pain meds which may cause drowsiness, pt verbalized understanding.  Pt agreeable to ambulation. Performed Sit to stand with CGA and ambulated to day room with RW and CGA. At hight low mat participated in bean bag toss without AD. Pt able to perform reaching high/low maintaining spinal precautions with CGA and no LOB noted. Pt then ambulated and used reacher to pick up bags. Due to pt's height and short reacher pt occasionally have to perform mini squat to obtain bag. Discussed purchasing longer reacher vs bending or squatting. Pt was able to perform mini squats with CGA although pt stating increased pain. After seated rest pt performed stand pivot to w/c and transported back to room. Pt then performed squat pivot to recliner. Pt left in recliner at end of session with seat alarm on, call bell within reach and needs met.      Therapy Documentation Precautions:  Precautions Precautions: Back, Fall Precaution Booklet Issued: Yes (  comment) Restrictions Weight Bearing Restrictions: No Other Position/Activity Restrictions: Per Ortho consult, WBAT bil hips General:   Vital Signs: Therapy Vitals Temp: 97.8 F (36.6 C) Pulse Rate: 66 Resp: 20 BP: (!) 143/102 Patient Position (if appropriate): Sitting Oxygen Therapy SpO2: 92 % O2 Device: Room Air Pain:   Mobility:   Locomotion :     Trunk/Postural Assessment :    Balance:   Exercises:   Other Treatments:      Therapy/Group: Individual Therapy  Taite Baldassari 02/14/2021, 4:29 PM

## 2021-02-14 NOTE — Progress Notes (Signed)
PROGRESS NOTE   Subjective/Complaints:   Pt reports pain somewhat better since started Oxycontin yesterday- has gotten down to <7/10 more frequently/when didn't at all before.   Bowels OK.  Pain is 7/10 right now but due to long acting and short acting.   Feels like might be a little slower- he thinks due to pain meds- will cancel his mediation scheduled next Thursday since he's on the pain meds.    ROS:  Pt denies SOB, abd pain, CP, N/V/C/D, and vision changes   Objective:   No results found. Recent Labs    02/13/21 0517  WBC 4.3  HGB 9.8*  HCT 29.4*  PLT 203   Recent Labs    02/12/21 0213 02/13/21 0517  NA 131* 130*  K 4.2 4.4  CL 98 98  CO2 24 27  GLUCOSE 115* 111*  BUN 16 17  CREATININE 0.80 0.85  CALCIUM 10.1  9.1 10.2    Intake/Output Summary (Last 24 hours) at 02/14/2021 0851 Last data filed at 02/14/2021 0715 Gross per 24 hour  Intake 840 ml  Output 2600 ml  Net -1760 ml        Physical Exam: Vital Signs Blood pressure 139/82, pulse 79, temperature 100.1 F (37.8 C), resp. rate 17, height 6' 2.02" (1.88 m), weight 88.8 kg, SpO2 98 %.    General: awake, alert, appropriate, sitting up in bed- woke abruptly when I woke him up; NAD HENT: conjugate gaze; oropharynx moist CV: regular  but borderline tachycardic rate; no JVD Pulmonary: CTA B/L; no W/R/R- good air movement GI: soft, NT, ND, (+)BS Psychiatric: appropriate- very talkative Neurological: alert- very trace delayed responses; sometimes tangential  Skin: incision with honeycomb dressing and steristrips- c/D/i Neuro: CN 2-12 intact. Alert and oriented x4.  Musculoskeletal: 5/5 strength, sensation intact.     Assessment/Plan: 1. Functional deficits which require 3+ hours per day of interdisciplinary therapy in a comprehensive inpatient rehab setting. Physiatrist is providing close team supervision and 24 hour management of  active medical problems listed below. Physiatrist and rehab team continue to assess barriers to discharge/monitor patient progress toward functional and medical goals  Care Tool:  Bathing    Body parts bathed by patient: Right arm, Left arm, Chest, Abdomen, Front perineal area, Buttocks, Right upper leg, Left upper leg, Right lower leg, Left lower leg, Face         Bathing assist Assist Level: Contact Guard/Touching assist     Upper Body Dressing/Undressing Upper body dressing   What is the patient wearing?: Pull over shirt, Orthosis    Upper body assist Assist Level: Minimal Assistance - Patient > 75%    Lower Body Dressing/Undressing Lower body dressing      What is the patient wearing?: Underwear/pull up, Pants     Lower body assist Assist for lower body dressing: Contact Guard/Touching assist     Toileting Toileting    Toileting assist Assist for toileting: Contact Guard/Touching assist     Transfers Chair/bed transfer  Transfers assist     Chair/bed transfer assist level: Contact Guard/Touching assist     Locomotion Ambulation   Ambulation assist      Assist level: Minimal  Assistance - Patient > 75% Assistive device: Walker-rolling Max distance: 20   Walk 10 feet activity   Assist     Assist level: Minimal Assistance - Patient > 75% Assistive device: Walker-rolling   Walk 50 feet activity   Assist Walk 50 feet with 2 turns activity did not occur: Safety/medical concerns         Walk 150 feet activity   Assist Walk 150 feet activity did not occur: Safety/medical concerns         Walk 10 feet on uneven surface  activity   Assist Walk 10 feet on uneven surfaces activity did not occur: Safety/medical concerns         Wheelchair     Assist Is the patient using a wheelchair?: No             Wheelchair 50 feet with 2 turns activity    Assist            Wheelchair 150 feet activity     Assist           Blood pressure 139/82, pulse 79, temperature 100.1 F (37.8 C), resp. rate 17, height 6' 2.02" (1.88 m), weight 88.8 kg, SpO2 98 %.  Medical Problem List and Plan: 1. 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             -patient may shower but incision must be covered             -ELOS/Goals: 10-14 days S  -con't CIR PT and OT-  2.  Antithrombotics: -DVT/anticoagulation:  Pharmaceutical: Other (comment) Eliquis             -antiplatelet therapy: none 3. Pain Management: oxycodone IR 10 mg every 4 hours; Tylenol 650 mg every 6 hours; Flexeril 10 mg TID PRN  12/9- changed scheduled oxycodone 10 mg to Oxycontin 15 mg BID and con't prn Oxy q3 hours prn. Pain doing better- con't new regimen 4. Mood: LCSW to provide support             -antipsychotic agents: n/a 5. Neuropsych: This patient is capable of making decisions on his own behalf. 6. Skin/Wound Care: incision care:dry dressing and monitor; routine skin checks 7. Fluids/Electrolytes/Nutrition: routine ins and outs with follow-up chemistries 8. Persistent atrial fibrillation: rate controlled; continue apixaban 9. Dyslipidemia: continue Crestor 10 mg daily, heart healthy diet 10: History of prostate cancer; no evidence currently of metastases; completed radiation therapy and hormonal therapy [facility]). L5 biopsy with no evidence of metastasis. Continue calcitonin, Flomax. Check vitamin D level tomorrow.  12/9- Vit D level ok  at 39.3 11: mild normocytic, normochromic anemia: monitor CBC, repeat tomorrow 12 Cancer-related fatigue: discussed with him over the counter N-acetyl-cysteine 600mg  BID versus Modafinil and he prefers to start with the former- he will ask his wife to purchase and advised him to give medication to nurse for [place] to place patient may use own med order. 13. HTN- DBP higher than expected  12/9- BP 149/111-  will monitor for trend 14. Hyponatremia  12/9- Na 130 down from 131- will recheck Saturday and if continues to drop, will place on fluid restrictions.      LOS: 2 days A FACE TO FACE EVALUATION WAS PERFORMED  Kaizley Aja 02/14/2021, 8:51 AM

## 2021-02-14 NOTE — Care Management (Signed)
Inpatient [facility] Individual Statement of Services  Patient Name:  Todd Chapman  Date:  02/14/2021  Welcome to the [facility].  Our goal is to provide you with an individualized program based on your diagnosis and situation, designed to meet your specific needs.  With this comprehensive rehabilitation program, you will be expected to participate in at least 3 hours of rehabilitation therapies Monday-Friday, with modified therapy programming on the weekends.  Your rehabilitation program will include the following services:  Physical Therapy (PT), Occupational Therapy (OT), 24 hour per day rehabilitation nursing, Therapeutic Recreaction (TR), Psychology, Neuropsychology, Care Coordinator, Rehabilitation Medicine, [place], and Other  Weekly team conferences will be held on Tuesdays to discuss your progress.  Your Inpatient Rehabilitation Care Coordinator will talk with you frequently to get your input and to update you on team discussions.  Team conferences with you and your family in attendance may also be held.  Expected length of stay: 6-12 days    Overall anticipated outcome: Independent with an Assistive Device  Depending on your progress and recovery, your program may change. Your Inpatient Rehabilitation Care Coordinator will coordinate services and will keep you informed of any changes. Your Inpatient Rehabilitation Care Coordinator's name and contact numbers are listed  below.  The following services may also be recommended but are not provided by the [facility] will be made to provide these services after discharge if needed.  Arrangements include referral to agencies that provide these services.  Your insurance has been verified to be:  Medicare A/B  Your  primary doctor is:  [name]  Pertinent information will be shared with your doctor and your insurance company.  Inpatient Rehabilitation Care Coordinator:  Cathleen Corti [phone] or [phone]  Information discussed with and copy given to patient by: Rana Snare, 02/14/2021, 11:52 AM

## 2021-02-15 DIAGNOSIS — S32050G Wedge compression fracture of fifth lumbar vertebra, subsequent encounter for fracture with delayed healing: Secondary | ICD-10-CM | POA: Diagnosis not present

## 2021-02-15 LAB — BASIC METABOLIC PANEL
Anion gap: 6 (ref 5–15)
BUN: 21 mg/dL (ref 8–23)
CO2: 28 mmol/L (ref 22–32)
Calcium: 10.1 mg/dL (ref 8.9–10.3)
Chloride: 96 mmol/L — ABNORMAL LOW (ref 98–111)
Creatinine, Ser: 0.87 mg/dL (ref 0.61–1.24)
GFR, Estimated: >60 mL/min (ref >60)
Glucose, Bld: 112 mg/dL — ABNORMAL HIGH (ref 70–99)
Potassium: 4.2 mmol/L (ref 3.5–5.1)
Sodium: 130 mmol/L — ABNORMAL LOW (ref 135–145)

## 2021-02-15 NOTE — Progress Notes (Signed)
Occupational Therapy Session Note  Patient Details  Name: Todd Chapman MRN: [id number] Date of Birth: 06-17-1945  Today's Date: 02/16/2021 OT Individual Time: 8676-1950 OT Individual Time Calculation (min): 57 min    Short Term Goals: Week 1:  OT Short Term Goal 1 (Week 1): STG=LTG  Skilled Therapeutic Interventions/Progress Updates:    Pt greeted in the bathroom, just turned off the water in the shower and sitting on TTB. Pt stating that the [place] sponge was "useless," that he showered himself at sit<stand level given setup assistance from nursing. Pt used the RW with close supervision assistance to ambulate to EOB. He then dressed at sit<stand level using RW, cues for rearranging clothing and laterally leaning for clothing placed beside him to avoid twisting. Cues also to hinge at the hips when meeting demands of LB self care while adhering to his back precautions. Pt able to don Teds on his own today given supervision using adaptive bag method, able to utilize figure 4 position bilaterally. Supervision/cues for donning his LSO. Pt then ambulated using RW with close w/c follow, made it ~22ft before needing to sit in the dayroom. He then ambulated ~79ft back to his room and transferred to the recliner. Pt states that he has some lumbar flank pain but wants to just "work it out" with stretching/therapy vs using ice or pain medicine. He also really wants to work on the stairs due to wanting to be Mod I with stairs in order to safely access 3 levels of his home. Advised him to address this with PT who was going to arrive within the next few minutes. He remained sitting up in the recliner, all needs within reach and chair alarm set. Tx focus placed on adaptive self care skills, dynamic balance, and activity tolerance.   Therapy Documentation Precautions:  Precautions Precautions: Back, Fall Precaution Booklet Issued: Yes (comment) Restrictions Weight Bearing Restrictions: No Other  Position/Activity Restrictions: Per Ortho consult, WBAT bil hips  ADL: ADL Eating: Independent Grooming: Independent Upper Body Bathing: Supervision/safety Lower Body Bathing: Supervision/safety Upper Body Dressing: Minimal assistance Lower Body Dressing: Minimal assistance Toileting: Minimal assistance Toilet Transfer: Minimal assistance Toilet Transfer Method: Ambulating Walk-In Shower Transfer: Minimal assistance Youth worker: Grab bars   Therapy/Group: Individual Therapy  [name] [name] 02/16/2021, 12:42 PM

## 2021-02-15 NOTE — Progress Notes (Signed)
PROGRESS NOTE   Subjective/Complaints:  Pt reports pain meds "just right"- trying to avoid prn meds due to confusion/sedation.  LBM yesterday.   ROS:  Pt denies SOB, abd pain, CP, N/V/C/D, and vision changes    Objective:   No results found. Recent Labs    02/13/21 0517  WBC 4.3  HGB 9.8*  HCT 29.4*  PLT 203   Recent Labs    02/13/21 0517 02/15/21 0503  NA 130* 130*  K 4.4 4.2  CL 98 96*  CO2 27 28  GLUCOSE 111* 112*  BUN 17 21  CREATININE 0.85 0.87  CALCIUM 10.2 10.1    Intake/Output Summary (Last 24 hours) at 02/15/2021 1023 Last data filed at 02/15/2021 0735 Gross per 24 hour  Intake 960 ml  Output 1225 ml  Net -265 ml        Physical Exam: Vital Signs Blood pressure 124/90, pulse 68, temperature 98.8 F (37.1 C), temperature source Oral, resp. rate 17, height 6' 2.02" (1.88 m), weight 88.8 kg, SpO2 98 %.    General: awake, alert, appropriate, sitting up in bed; NAD HENT: conjugate gaze; oropharynx moist CV: regular rate; no JVD Pulmonary: CTA B/L; no W/R/R- good air movement GI: soft, NT, ND, (+)BS Psychiatric: appropriate; interactive Neurological: no change as below; alert - very trace delayed responses; sometimes tangential  Skin: incision with honeycomb dressing and steristrips- c/D/i Neuro: CN 2-12 intact. Alert and oriented x4.  Musculoskeletal: 5/5 strength, sensation intact.     Assessment/Plan: 1. Functional deficits which require 3+ hours per day of interdisciplinary therapy in a comprehensive inpatient rehab setting. Physiatrist is providing close team supervision and 24 hour management of active medical problems listed below. Physiatrist and rehab team continue to assess barriers to discharge/monitor patient progress toward functional and medical goals  Care Tool:  Bathing    Body parts bathed by patient: Right arm, Left arm, Chest, Abdomen, Front perineal area,  Buttocks, Right upper leg, Left upper leg, Right lower leg, Left lower leg, Face         Bathing assist Assist Level: Contact Guard/Touching assist     Upper Body Dressing/Undressing Upper body dressing   What is the patient wearing?: Pull over shirt, Orthosis    Upper body assist Assist Level: Minimal Assistance - Patient > 75%    Lower Body Dressing/Undressing Lower body dressing      What is the patient wearing?: Underwear/pull up, Pants     Lower body assist Assist for lower body dressing: Set up assist     Toileting Toileting    Toileting assist Assist for toileting: Contact Guard/Touching assist     Transfers Chair/bed transfer  Transfers assist     Chair/bed transfer assist level: Independent with assistive device Chair/bed transfer assistive device: Programmer, multimedia   Ambulation assist      Assist level: Minimal Assistance - Patient > 75% Assistive device: Walker-rolling Max distance: 20   Walk 10 feet activity   Assist     Assist level: Minimal Assistance - Patient > 75% Assistive device: Walker-rolling   Walk 50 feet activity   Assist Walk 50 feet with 2 turns activity did  not occur: Safety/medical concerns         Walk 150 feet activity   Assist Walk 150 feet activity did not occur: Safety/medical concerns         Walk 10 feet on uneven surface  activity   Assist Walk 10 feet on uneven surfaces activity did not occur: Safety/medical concerns         Wheelchair     Assist Is the patient using a wheelchair?: No             Wheelchair 50 feet with 2 turns activity    Assist            Wheelchair 150 feet activity     Assist          Blood pressure 124/90, pulse 68, temperature 98.8 F (37.1 C), temperature source Oral, resp. rate 17, height 6' 2.02" (1.88 m), weight 88.8 kg, SpO2 98 %.  Medical Problem List and Plan: 1. 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             -patient may shower but incision must be covered             -ELOS/Goals: 10-14 days S  Con't CIR_ PT and OT 2.  Antithrombotics: -DVT/anticoagulation:  Pharmaceutical: Other (comment) Eliquis             -antiplatelet therapy: none 3. Pain Management: oxycodone IR 10 mg every 4 hours; Tylenol 650 mg every 6 hours; Flexeril 10 mg TID PRN  12/9- changed scheduled oxycodone 10 mg to Oxycontin 15 mg BID and con't prn Oxy q3 hours prn. Pain doing better- con't new regimen  12/10- pain meds "Just right". Con't regimen 4. Mood: LCSW to provide support             -antipsychotic agents: n/a 5. Neuropsych: This patient is capable of making decisions on his own behalf. 6. Skin/Wound Care: incision care:dry dressing and monitor; routine skin checks 7. Fluids/Electrolytes/Nutrition: routine ins and outs with follow-up chemistries 8. Persistent atrial fibrillation: rate controlled; continue apixaban  12/10- Afib stable- rate controlled- con't regimen 9. Dyslipidemia: continue Crestor 10 mg daily, heart healthy diet 10: History of prostate cancer; no evidence currently of metastases; completed radiation therapy and hormonal therapy [facility]). L5 biopsy with no evidence of metastasis. Continue calcitonin, Flomax. Check vitamin D level tomorrow.  12/9- Vit D level ok  at 39.3 11: mild normocytic, normochromic anemia: monitor CBC, repeat tomorrow 12 Cancer-related fatigue: discussed with him over the counter N-acetyl-cysteine 600mg  BID versus Modafinil and he prefers to start with the former- he will ask his wife to purchase and advised him to give medication to nurse for [place] to place patient may use own med order. 13. HTN- DBP higher than expected  12/9- BP 149/111- will monitor for trend  12/10- BP much better today- con't to monitor 14. Hyponatremia  12/9- Na 130 down from 131- will recheck  Saturday and if continues to drop, will place on fluid restrictions.   12/10- Na 130- will recheck weekly on qmonday.      LOS: 3 days A FACE TO FACE EVALUATION WAS PERFORMED  Todd Chapman 02/15/2021, 10:23 AM

## 2021-02-15 NOTE — IPOC Note (Signed)
Overall Plan of Care [facility]) Patient Details Name: Todd Chapman MRN: [id number] DOB: 1945-11-20  Admitting Diagnosis: Vertebral compression fracture [facility])  Hospital Problems: Principal Problem:   Vertebral compression fracture ([place])     Functional Problem List: Nursing Endurance, Medication Management, Pain, Safety, Skin Integrity  PT Balance, Behavior, Edema, Endurance, Motor, Nutrition, Pain, Perception, Safety, Skin Integrity, Sensory  OT Balance, Safety, Edema, Endurance, Motor, Pain  SLP    TR         Basic ADL's: OT Bathing, Dressing, Toileting     Advanced  ADL's: OT       Transfers: PT Bed Mobility, Bed to Chair, Car, Manufacturing systems engineer, Metallurgist: PT Ambulation, Stairs     Additional Impairments: OT Fuctional Use of Upper Extremity  SLP        TR      Anticipated Outcomes Item Anticipated Outcome  Self Feeding n/a  Swallowing      Basic self-care  mod I  Toileting  mod I   Bathroom Transfers mod I  Bowel/Bladder  n/a  Transfers  ModI  Locomotion  supervision  Communication     Cognition     Pain  < 3  Safety/Judgment  supervision   Therapy Plan: PT Intensity: Minimum of 1-2 x/day ,45 to 90 minutes PT Frequency: 5 out of 7 days PT Duration Estimated Length of Stay: 10-12 days OT Intensity: Minimum of 1-2 x/day, 45 to 90 minutes OT Frequency: 5 out of 7 days OT Duration/Estimated Length of Stay: ~ 6-9 days     Due to the current state of emergency, patients may not be receiving their 3-hours of Medicare-mandated therapy.   Team Interventions: Nursing Interventions Patient/Family Education, Disease Management/Prevention, Pain Management, Medication Management, Skin Care/Wound Management, Discharge Planning  PT interventions Ambulation/gait training, Cognitive remediation/compensation, Discharge planning, DME/adaptive equipment instruction, Functional mobility training, Pain management, Psychosocial support,  Splinting/orthotics, Therapeutic Activities, UE/LE Strength taining/ROM, Visual/perceptual remediation/compensation, Training and development officer, Community reintegration, Disease management/prevention, Functional electrical stimulation, Neuromuscular re-education, Patient/family education, Skin care/wound management, Stair training, Therapeutic Exercise, UE/LE Coordination activities, Wheelchair propulsion/positioning  OT Interventions Training and development officer, Discharge planning, Pain management, Self Care/advanced ADL retraining, Therapeutic Activities, Functional mobility training, Patient/family education, [employer] reintegration, Engineer, drilling, Neuromuscular re-education, Psychosocial support, UE/LE Strength taining/ROM, Therapeutic Exercise  SLP Interventions    TR Interventions    SW/CM Interventions Discharge Planning, Psychosocial Support, Patient/Family Education   Barriers to Discharge MD  Medical stability, Home enviroment access/loayout, Wound care, Lack of/limited family support, Weight bearing restrictions, and pain  Nursing Decreased caregiver support, Home environment access/layout, Wound Care, Lack of/limited family support, Weight, Weight bearing restrictions, Medication compliance Lives in multi-level home with 2 steps to enter. No rails. 1/2 bath on main level. Full flight of stairs with rails. Lives with spouse who can provide supervision assist at discharge.  PT Home environment access/layout, Decreased caregiver support    OT      SLP      SW Decreased caregiver support, Lack of/limited family support 2 flights of stairs to bedrooms on 3rd floor; wife will fully work from home   Team Discharge Planning: Destination: PT-Home ,OT- Home , SLP-  Projected Follow-up: PT-Home health PT, Outpatient PT, OT-  Home health OT, SLP-  Projected Equipment Needs: PT-To be determined, OT- 3 in 1 bedside comode, SLP-  Equipment Details: PT- , OT-already has a  shower chair Patient/family involved in discharge planning: PT- Patient,  OT-Patient, SLP-   MD ELOS:  6-9 days Medical Rehab Prognosis:  Good Assessment: 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  Goals mod I to supervision    See Team Conference Notes for weekly updates to the plan of care

## 2021-02-16 NOTE — Progress Notes (Signed)
Patient showed to this RN a small amount of sputum with blood that came out when he coughed on a tissue paper. Patient concerned that last time this happened was he had pneumonia. Informed patient that he is on blood thinner. Sputum saved for MD to see.

## 2021-02-16 NOTE — Progress Notes (Signed)
Physical Therapy Session Note  Patient Details  Name: Todd Chapman MRN: [id number] Date of Birth: Aug 15, 1945  Today's Date: 02/16/2021 PT Individual Time: 7846-9629 and 1405-1510 PT Individual Time Calculation (min): 72 min  and 65 min   Short Term Goals: Week 1:  PT Short Term Goal 1 (Week 1): Patient will recall 3/3 back precautions with no additional cuing PT Short Term Goal 2 (Week 1): Patient will complete bed <> wc transfer with LRAD and CGA PT Short Term Goal 3 (Week 1): Patient will ambulate >2ft with LRAD and CGA PT Short Term Goal 4 (Week 1): Patient will initiate stair training  Skilled Therapeutic Interventions/Progress Updates:    Session 1: Pt received sitting in recliner and eager to participate in therapy session. Reports his goal is to be able to navigate stairs without his wife's assistance as she works 7am-6pm and cannot be interrupted during her work time, but she can schedule times during the day to assist him. Pt is very invested in his medical care and throughout session discusses what he has experienced and what his current medical plan is - appears to find comfort in knowing he has a good understanding of what is happening anatomically - therapist able to confirm what pt is stating based on MD notes as able and otherwise educates pt on discussing the rest with his medical team.   Sit<>stands throughout session using RW with CGA for safety - pt prefers not to push up with his hands in order to focus on his B LE strengthening throughout session - does well maintaining spine precautions.  Gait training ~172ft to main therapy gym using RW with CGA for safety and w/c follow in event of pain onset - once arrived to gym pt reports sudden spike in his pain requiring quick chair for seated break, states his pain was building up while walking but then he had a sudden onset of sharp pain - throughout demos reciprocal stepping pattern with good upright posture and adequate gait  speed.  Pt reports very point specific tenderness to touch on R PSIS (posterior superior iliac spine) to the point that therapist is unable to even lightly touch it - requires seated rest break and distraction for pain management - pt declines taking medications at this time despite reporting feeling as though he needs them - states he wants to know how much pain he truly experiences during mobility in order to determine if he actually needs the pain meds.  Gait ~71ft to stairs using RW with CGA, continued w/c follow in event of increased pain for need of seated break.  Stair navigation ascending/descending 4 (6" height) steps using R HR only to simulate home environment - CGA for safety - cuing for partial side stepping technique to allow B UE support on the 1 HR - step-to pattern leading with L LE on ascend and R LE on descent; however, he reports his wife may have 2nd HR installed, but not by D/C. Pt reports significant amount of pain at the top of the stairs requiring standing rest break and then once at the bottom pt wants to remain standing and attempts to reposition to off-weight R LE for pain management but this is not effective so responsive to therapist encouraging seated break.   Pt eager to attempt stair navigation in stairwell in preparation for going upstairs at home - therapist placed chair at the top of the stairs for seated break. Ascended/descended 11 steps using B UE support on R  HR only with step-to pattern leading with L LE on ascent and R LE on descent - CGA for safety - seated break at top and bottom of stairs for pain management; however, pt denies any sharp pain.  Educated pt that in the beginning at home he will need his wife's supervision during stair navigation, at minimum, to ensure his safety.   Gait training ~134ft back to room using RW with CGA - same gait pattern as above. Pt states he wants to attempt stand pivot to recliner without AD; however, therapist reinforced the  importance of utilizing RW to off-weight R LE while his fx is healing because he is only WBAT and has significant pain when not using UE support - pt receptive to this education. Once sitting in room pt reflective on his CLOF and starts D/C planning with him stating he will likely need to take a leave of absence from work because he doesn't want to be on pain medication while working and cannot currently go without it. Therapist reinforced importance of his health and recovery being the main priority at this time. Pt left seated in recliner with needs in reach and chair alarm on.    Session 2: Pt received sitting in recliner, asleep but easily awakens and agreeable to therapy session. Sit<>stands with close supervision - pt continues to limit pushing up through arms stating he wants to work on B LE strengthening.   Gait training ~139ft to main therapy gym using RW with CGA/close supervision for safety with w/c follow in event of increase in pain - continues to have reciprocal stepping pattern with adequate gait speed and no overt signs of significant postural instability.   Reports his wife does not plan to take any time off from work, but she does work from home as described above and the plan is for a neighbor to come stay when pt's wife needs to go to somewhere.   When inquired about how long pt feels he needs CIR level therapy pt states: "I am hurting way to much to even think about going home soon, but am hoping I will feel better in the next few days." Pt wants to push himself and often does not verbalize his pain until it is very significant of at least 7-8/10.   Discussed plan for team conference on Tuesday to discuss D/C plan with initial ELOS of 10-12 days and CLOF. Educated pt that we recommend family education/training prior to D/C home and pt reports his wife's only day off each week is on Mondays - pt called wife and she is available to attend therapy sessions tomorrow AM to see his CLOF  and help with discussions to aid in D/C planning prior to conference on Tuesday.   Stair navigation training ascending/descending 11 steps in stairwell using B UE support on R HR only with step-to pattern leading with L LE on ascent and R LE on descent - CGA for safety but pt demos good technique and carryover from AM - chair at top and bottom of stairs for seated rest beaks for pain management.  Discussed pt's frequent need for seated rest breaks due to pain with ambulation and suggested trying rollator. Therapist retrieved standard rollator; however, may want to consider trying bariatric rollator if the seat is higher. Educated pt on proper use of brakes for transfers and need to put rollator against wall if going to use it for seated break. Gait training ~186ft back to room using rollator with CGA and  pt demos safe management of AD at this time and good recall/carryover of placing AD against wall and locking brakes to take 1x seated rest.  At end of session, pt left seated in recliner (w/c cushion in recliner seat) with needs in reach and chair alarm on.   Therapy Documentation Precautions:  Precautions Precautions: Back, Fall Precaution Booklet Issued: Yes (comment) Restrictions Weight Bearing Restrictions: No Other Position/Activity Restrictions: Per Ortho consult, WBAT bil hips   Pain: Session 1: Reports pain specifically located at PSIS; however, denies wanting medication during session stating he wants to know whether he truly needs it - therapist provides and encourages seated rest breaks for pain management as well as provides distraction.  Session 2:  Rates 7/10 pain located at PSIS that increases to 8.5/10 after stair navigation - pt continues to decline additional medication administration at this time therefore therapist provides frequent seated rest breaks and distraction for pain management.   Therapy/Group: Individual Therapy  Tawana Scale , PT, DPT, NCS,  CSRS  02/16/2021, 7:54 AM

## 2021-02-17 ENCOUNTER — Inpatient Hospital Stay (HOSPITAL_COMMUNITY): Payer: Medicare Other

## 2021-02-17 DIAGNOSIS — S32050G Wedge compression fracture of fifth lumbar vertebra, subsequent encounter for fracture with delayed healing: Secondary | ICD-10-CM | POA: Diagnosis not present

## 2021-02-17 LAB — CBC WITH DIFFERENTIAL/PLATELET
Abs Immature Granulocytes: 0.03 10*3/uL (ref 0.00–0.07)
Basophils Absolute: 0 10*3/uL (ref 0.0–0.1)
Basophils Relative: 0 %
Eosinophils Absolute: 0.2 10*3/uL (ref 0.0–0.5)
Eosinophils Relative: 6 %
HCT: 27.9 % — ABNORMAL LOW (ref 39.0–52.0)
Hemoglobin: 9.2 g/dL — ABNORMAL LOW (ref 13.0–17.0)
Immature Granulocytes: 1 %
Lymphocytes Relative: 14 %
Lymphs Abs: 0.5 10*3/uL — ABNORMAL LOW (ref 0.7–4.0)
MCH: 30.7 pg (ref 26.0–34.0)
MCHC: 33 g/dL (ref 30.0–36.0)
MCV: 93 fL (ref 80.0–100.0)
Monocytes Absolute: 0.5 10*3/uL (ref 0.1–1.0)
Monocytes Relative: 15 %
Neutro Abs: 2.2 10*3/uL (ref 1.7–7.7)
Neutrophils Relative %: 64 %
Platelets: 247 10*3/uL (ref 150–400)
RBC: 3 MIL/uL — ABNORMAL LOW (ref 4.22–5.81)
RDW: 13.3 % (ref 11.5–15.5)
WBC: 3.4 10*3/uL — ABNORMAL LOW (ref 4.0–10.5)
nRBC: 0 % (ref 0.0–0.2)

## 2021-02-17 LAB — COMPREHENSIVE METABOLIC PANEL
ALT: 15 U/L (ref 0–44)
AST: 20 U/L (ref 15–41)
Albumin: 2.9 g/dL — ABNORMAL LOW (ref 3.5–5.0)
Alkaline Phosphatase: 76 U/L (ref 38–126)
Anion gap: 5 (ref 5–15)
BUN: 13 mg/dL (ref 8–23)
CO2: 29 mmol/L (ref 22–32)
Calcium: 9.9 mg/dL (ref 8.9–10.3)
Chloride: 99 mmol/L (ref 98–111)
Creatinine, Ser: 0.96 mg/dL (ref 0.61–1.24)
GFR, Estimated: >60 mL/min (ref >60)
Glucose, Bld: 101 mg/dL — ABNORMAL HIGH (ref 70–99)
Potassium: 4.1 mmol/L (ref 3.5–5.1)
Sodium: 133 mmol/L — ABNORMAL LOW (ref 135–145)
Total Bilirubin: 1 mg/dL (ref 0.3–1.2)
Total Protein: 5.4 g/dL — ABNORMAL LOW (ref 6.5–8.1)

## 2021-02-17 IMAGING — DX DG CHEST 2V
2 series · 3 of 3 positions shown · non-contrast
Comparison: [DATE].

CLINICAL DATA: Cough.

EXAM:
CHEST - 2 VIEW

[Series 2: chest lat · 0.14mm/px · 2 of 2 slices shown]
[im 1/2]
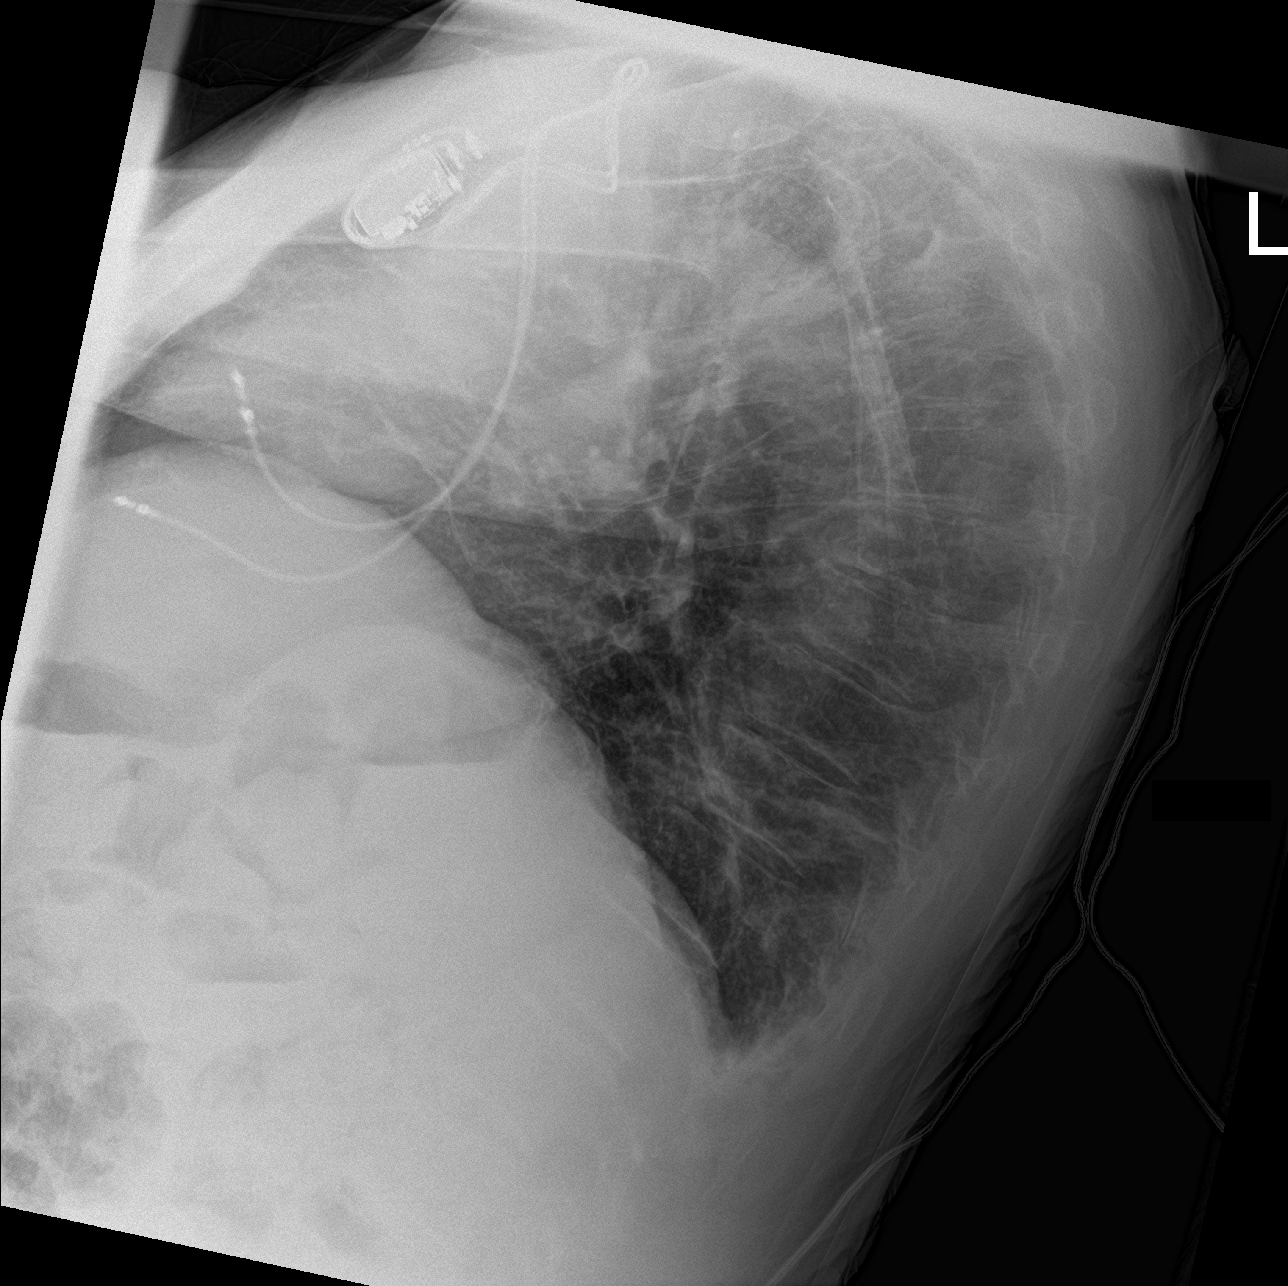
[im 2/2]
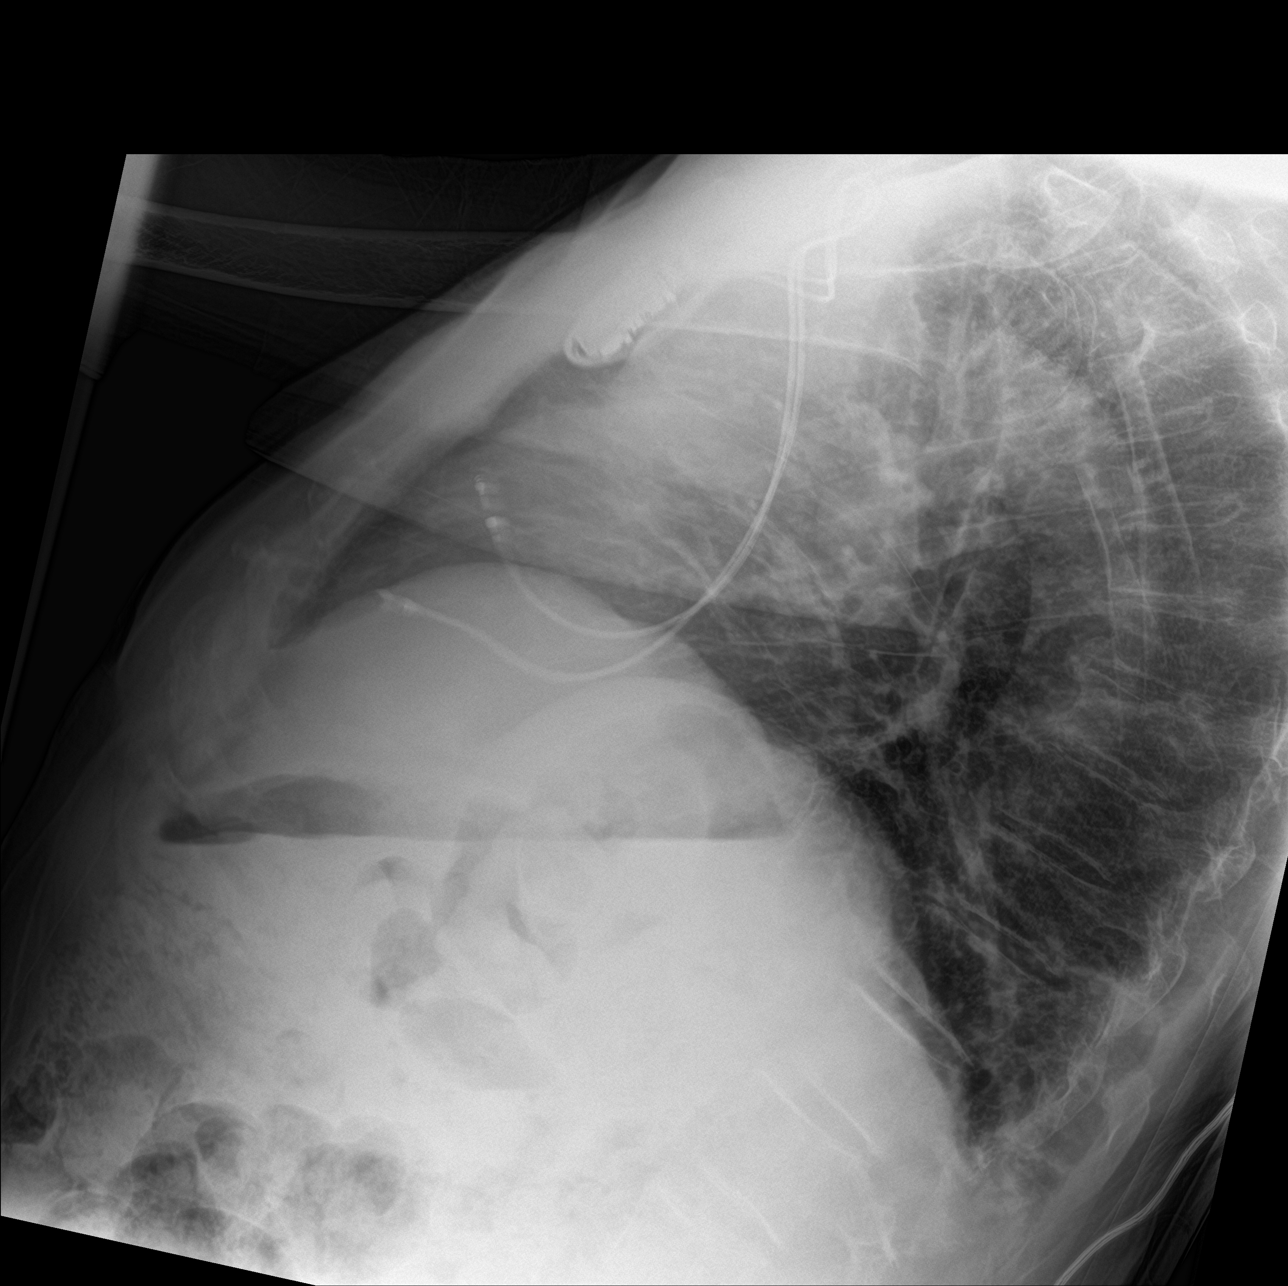

[chest ap]
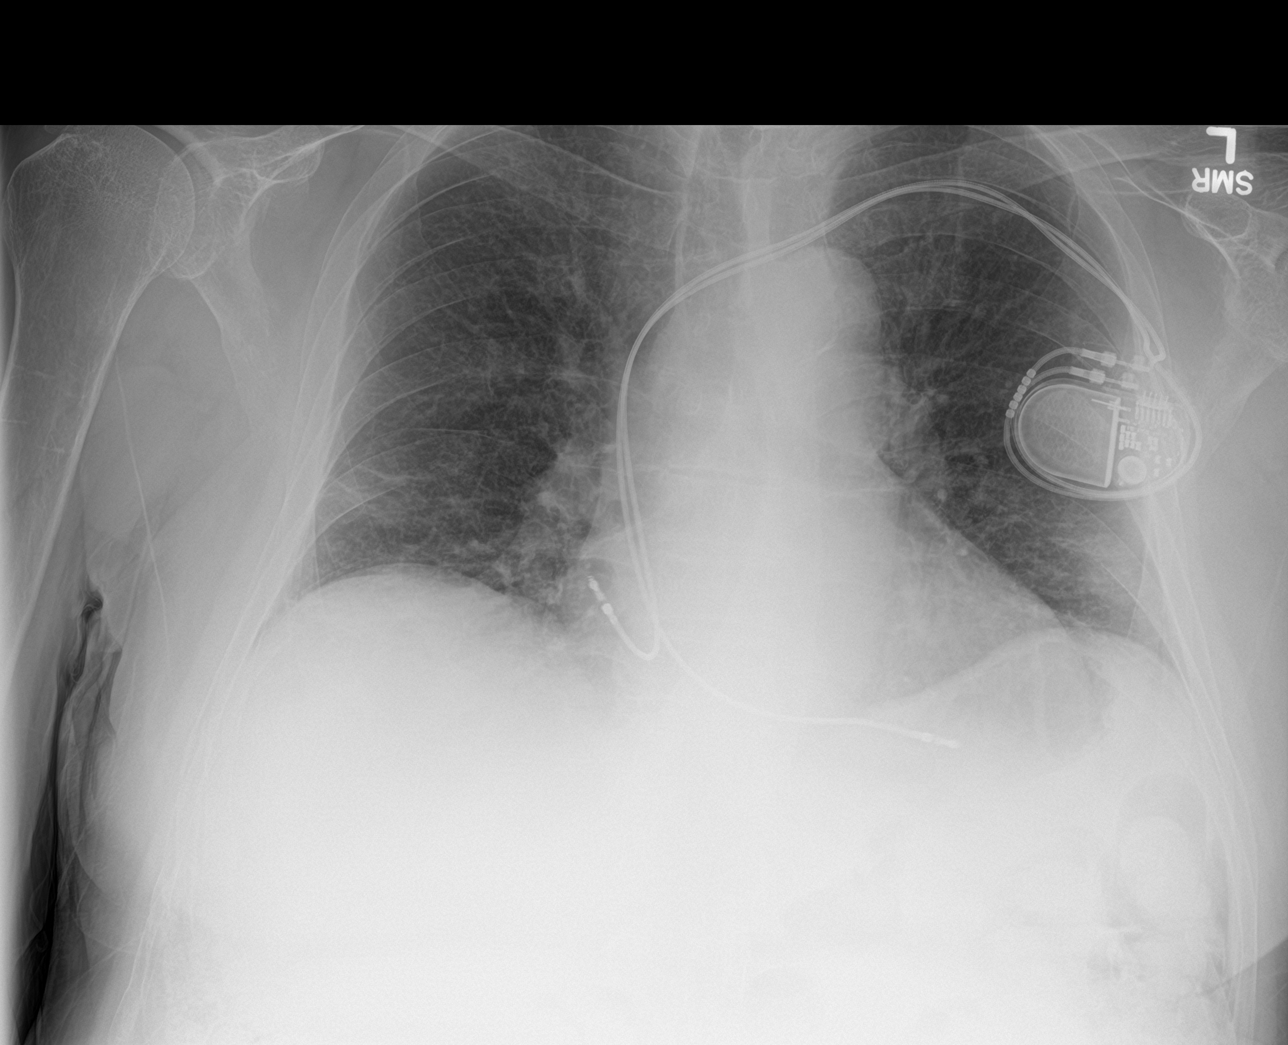

[3 of 3 positions shown; findings below may reference images not displayed]

FINDINGS: Stable cardiomediastinal silhouette. Hypoinflation of the lungs is
noted with minimal bibasilar subsegmental atelectasis. Left-sided
pacemaker is unchanged in position. Bony thorax is unremarkable.
IMPRESSION: Hypoinflation of the lungs with minimal bibasilar subsegmental
atelectasis.

## 2021-02-17 NOTE — Progress Notes (Signed)
Physical Therapy Session Note  Patient Details  Name: Todd Chapman MRN: [id number] Date of Birth: June 08, 1945  Today's Date: 02/17/2021 PT Individual Time: 1100-1200 PT Individual Time Calculation (min): 60 min   Short Term Goals: Week 1:  PT Short Term Goal 1 (Week 1): Patient will recall 3/3 back precautions with no additional cuing PT Short Term Goal 2 (Week 1): Patient will complete bed <> wc transfer with LRAD and CGA PT Short Term Goal 3 (Week 1): Patient will ambulate >12ft with LRAD and CGA PT Short Term Goal 4 (Week 1): Patient will initiate stair training  Skilled Therapeutic Interventions/Progress Updates:    Pt received seated in bed with wife [name] present for hands-on family education session. Pt reports some pain in his R posterior hip region at rest that increases with mobility. Pt reports being premedicated prior to start of therapy session, declines further intervention. Assisted pt with donning knee-high TEDs and shoes at bed level for time conservation. Bed mobility Supervision. Pt is setup A to don LSO while seated EOB. Reviewed back precautions with pt able to recall 3/3 verbally but needs cues to adhere to precautions in functional setting. Sit to stand with CGA to RW during session. Ambulation x 100 ft, x 200 ft with RW and CGA for balance, antalgic gait pattern with heavy UE reliance on RW due to pain in LE and WBAT for BLE. Reviewed stair management in home environment including setting up appropriate chair on landing for rest break and appropriate RW management. Pt able to ascend/descend 11 x 6" stairs with R handrail laterally x 2 reps with assist from therapist and then progression to assist from his wife. Pt and wife feel comfortable with stair navigation in the home. Pt returned to room and left seated in recliner with needs in reach, chair alarm in place, wife present.  Therapy Documentation Precautions:  Precautions Precautions: Back, Fall Precaution Booklet  Issued: Yes (comment) Restrictions Weight Bearing Restrictions: No Other Position/Activity Restrictions: Per Ortho consult, WBAT bil hips      Therapy/Group: Individual Therapy   Excell Seltzer, PT, DPT, CSRS  02/17/2021, 12:18 PM

## 2021-02-17 NOTE — Progress Notes (Signed)
PROGRESS NOTE   Subjective/Complaints:  Pt reports cannot go home before pain is controlled off meds- thinks needs to have surgery for "other compression fx's"- explained has has the acetabular fx's for months and will take a lot more months to heal them- we don't have a surgical intervention to fix- nor an easy answer to pain except pain meds.  Also concerned spit up blood colored phlegm yesterday after I left- wanted it shown to me-it was not- shown to nurse- no other episodes- per nurse, was blood flecked.  "Prone to pneumonia"- explained we are happy to check CXR- showed some minimal atelectasis. Did have to take 2 prns last night.  Still has a phlegm like cough..   ROS:  Pt denies SOB even though has new cough, abd pain, CP, N/V/C/D, and vision changes   Objective:   DG Chest 2 View  Result Date: 02/17/2021 CLINICAL DATA:  Cough. EXAM: CHEST - 2 VIEW COMPARISON:  February 06, 2021. FINDINGS: Stable cardiomediastinal silhouette. Hypoinflation of the lungs is noted with minimal bibasilar subsegmental atelectasis. 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 Bony thorax is unremarkable. IMPRESSION: Hypoinflation of the lungs with minimal bibasilar subsegmental atelectasis. Electronically Signed   By: Marijo Conception M.D.   On: 02/17/2021 10:10   Recent Labs    02/17/21 0528  WBC 3.4*  HGB 9.2*  HCT 27.9*  PLT 247   Recent Labs    02/15/21 0503 02/17/21 0528  NA 130* 133*  K 4.2 4.1  CL 96* 99  CO2 28 29  GLUCOSE 112* 101*  BUN 21 13  CREATININE 0.87 0.96  CALCIUM 10.1 9.9    Intake/Output Summary (Last 24 hours) at 02/17/2021 1732 Last data filed at 02/17/2021 1308 Gross per 24 hour  Intake 600 ml  Output 1325 ml  Net -725 ml        Physical Exam: Vital Signs Blood pressure (!) 144/85, pulse 71, temperature 98.4 F (36.9 C), temperature source Oral, resp. rate 16, height 6' 2.02" (1.88 m), weight 97.4  kg, SpO2 97 %.     General: awake, alert, appropriate,  sitting up in bed; NAD HENT: conjugate gaze; oropharynx moist CV: regular rate; no JVD Pulmonary: CTA B/L; no W/R/R- good air movement- sounds great GI: soft, NT, ND, (+)BS Psychiatric: appropriate; talkative, focused on phlegm and cough Neurological: trace delayed responses; Skin: incision with honeycomb dressing and steristrips- c/D/i Neuro: CN 2-12 intact. Alert and oriented x4.  Musculoskeletal: 5/5 strength, sensation intact.     Assessment/Plan: 1. Functional deficits which require 3+ hours per day of interdisciplinary therapy in a comprehensive inpatient rehab setting. Physiatrist is providing close team supervision and 24 hour management of active medical problems listed below. Physiatrist and rehab team continue to assess barriers to discharge/monitor patient progress toward functional and medical goals  Care Tool:  Bathing    Body parts bathed by patient: Right arm, Left arm, Chest, Abdomen, Front perineal area, Buttocks, Right upper leg, Left upper leg, Right lower leg, Left lower leg, Face         Bathing assist Assist Level: Set up assist (per pt report)     Upper Body Dressing/Undressing Upper  body dressing   What is the patient wearing?: Pull over shirt, Orthosis    Upper body assist Assist Level: Supervision/Verbal cueing    Lower Body Dressing/Undressing Lower body dressing      What is the patient wearing?: Underwear/pull up, Pants     Lower body assist Assist for lower body dressing: Supervision/Verbal cueing     Toileting Toileting    Toileting assist Assist for toileting: Contact Guard/Touching assist     Transfers Chair/bed transfer  Transfers assist     Chair/bed transfer assist level: Contact Guard/Touching assist Chair/bed transfer assistive device: Programmer, multimedia   Ambulation assist      Assist level: Contact Guard/Touching assist Assistive  device: Walker-rolling Max distance: 200'   Walk 10 feet activity   Assist     Assist level: Contact Guard/Touching assist Assistive device: Walker-rolling   Walk 50 feet activity   Assist Walk 50 feet with 2 turns activity did not occur: Safety/medical concerns  Assist level: Contact Guard/Touching assist Assistive device: Walker-rolling    Walk 150 feet activity   Assist Walk 150 feet activity did not occur: Safety/medical concerns  Assist level: Contact Guard/Touching assist Assistive device: Walker-rolling    Walk 10 feet on uneven surface  activity   Assist Walk 10 feet on uneven surfaces activity did not occur: Safety/medical concerns         Wheelchair     Assist Is the patient using a wheelchair?: No             Wheelchair 50 feet with 2 turns activity    Assist            Wheelchair 150 feet activity     Assist          Blood pressure (!) 144/85, pulse 71, temperature 98.4 F (36.9 C), temperature source Oral, resp. rate 16, height 6' 2.02" (1.88 m), weight 97.4 kg, SpO2 97 %.  Medical Problem List and Plan: 1. 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             -patient may shower but incision must be covered             -ELOS/Goals: 10-14 days S  Con't CIR_ PT and OT- pt insists he leave Friday due to help from wife who also works.  2.  Antithrombotics: -DVT/anticoagulation:  Pharmaceutical: Other (comment) Eliquis             -antiplatelet therapy: none 3. Pain Management: oxycodone IR 10 mg every 4 hours; Tylenol 650 mg every 6 hours; Flexeril 10 mg TID PRN  12/9- changed scheduled oxycodone 10 mg to Oxycontin 15 mg BID and con't prn Oxy q3 hours prn. Pain doing better- con't new regimen  12/12- when heard might leave midweek, said pain is not as well controlled- con't regimen 4. Mood: LCSW to provide  support             -antipsychotic agents: n/a 5. Neuropsych: This patient is capable of making decisions on his own behalf. 6. Skin/Wound Care: incision care:dry dressing and monitor; routine skin checks 7. Fluids/Electrolytes/Nutrition: routine ins and outs with follow-up chemistries 8. Persistent atrial fibrillation: rate controlled; continue apixaban  12/10- Afib stable- rate controlled- con't regimen 9. Dyslipidemia: continue Crestor 10 mg daily, heart healthy diet 10: History of prostate cancer; no evidence currently of metastases; completed radiation therapy and hormonal therapy (  [place]). L5 biopsy with no evidence of metastasis. Continue calcitonin, Flomax. Check vitamin D level tomorrow.  12/9- Vit D level ok  at 39.3 11: mild normocytic, normochromic anemia: monitor CBC, repeat tomorrow 12 Cancer-related fatigue: discussed with him over the counter N-acetyl-cysteine 600mg  BID versus Modafinil and he prefers to start with the former- he will ask his wife to purchase and advised him to give medication to nurse for [place] to place patient may use own med order. 13. HTN- DBP higher than expected  12/12- BP slightly labile- slightly elevated today- con't regimen 14. Hyponatremia  12/9- Na 130 down from 131- will recheck Saturday and if continues to drop, will place on fluid restrictions.   12/10- Na 130- will recheck weekly on qmonday.   12/12- Na up to 133 without fluid restrictions 15. Phlegm-y cough  12/12- will check CXR/ PA and lateral- shows minimal atelectasis- will order flutter valve and ask nursing to teach how to use. Reassure that lungs doing ok   I spent a total of 38 minutes on total care- >50% coordination of care over reading CXR myself- as well as d/w pt prolonged period about pain is going to be a more prolonged issue- cannot keep due to pain-      LOS: 5 days A FACE TO FACE EVALUATION WAS PERFORMED  Todd Chapman 02/17/2021, 5:32 PM

## 2021-02-17 NOTE — Progress Notes (Signed)
Occupational Therapy Session Note  Patient Details  Name: Todd Chapman MRN: [id number] Date of Birth: November 18, 1945  Today's Date: 02/17/2021 OT Individual Time: 1300-1345 OT Individual Time Calculation (min): 45 min    Short Term Goals: Week 1:  OT Short Term Goal 1 (Week 1): STG=LTG  Skilled Therapeutic Interventions/Progress Updates:    Pt resting in recliner upon arrival. Pt requested to use toilet. Pt also requested to stand at toilet to urinate. Pt amb with RW to bathroom with CGA. Pt stood at toilet with CGA. Toleting tasks with CGA. Pt returned to room and stood at sink to wash hands. Revisited shower arrangement at home. Pt practiced side stepping into shower and using grab bar to sit in shower seat. Pt completed task with CGA. Medication mgmt questions referred to MD. Educated pt/wife on importance of resting when pain becomes unmanageable. Pt reports that rest usually helps resolve pain to level he is able to perform tasks. Pt reports his pain is constant at 7/10. Pt remained in relciner with seat alarm activated. All needs within reach. Wife present.  Therapy Documentation Precautions:  Precautions Precautions: Back, Fall Precaution Booklet Issued: Yes (comment) Restrictions Weight Bearing Restrictions: No Other Position/Activity Restrictions: Per Ortho consult, WBAT bil hips Pain:  Pt resports 7/10 back pain escalating with activity; relieved with rest in chair  Therapy/Group: Individual Therapy  Leroy Libman 02/17/2021, 2:34 PM

## 2021-02-17 NOTE — Progress Notes (Signed)
Occupational Therapy Session Note  Patient Details  Name: Todd Chapman MRN: [id number] Date of Birth: [birth year]  Today's Date: 02/17/2021 OT Individual Time: 4818-5631 OT Individual Time Calculation (min): 40 min    Short Term Goals: Week 1:  OT Short Term Goal 1 (Week 1): STG=LTG  Skilled Therapeutic Interventions/Progress Updates:    Pt resting in bed upon arrival with wife present. OT intervention with focus on DME recommendations, discharge planning/recommendations , walk in shower transfers, functional amb with RW, and safety awareness. Supine<>EOB with supervision. Pt required min A for donning LSO while seated EOB. Sit<>stand and amb X 4' to shower frame to practice stepping over frame. Home shower has one grab bar placed on wall that is behind pt when stepping into shower. Will problem solve options. Pt has rollator and RW in room but will probably recommend RW upon discharge. Amb and simulated shower transfer with CGA. Pt returned to bed in preparation for transport to x-ray. Pt missed 35 mins skilled OT services.   Therapy Documentation Precautions:  Precautions Precautions: Back, Fall Precaution Booklet Issued: Yes (comment) Restrictions Weight Bearing Restrictions: No Other Position/Activity Restrictions: Per Ortho consult, WBAT bil hips General: General OT Amount of Missed Time: 35 Minutes  Pain: Pt c/o 7/10 pain in lower back and Rt hip; meds admin prior to therapy session   Therapy/Group: Individual Therapy  Leroy Libman 02/17/2021, 9:51 AM

## 2021-02-18 DIAGNOSIS — S32050G Wedge compression fracture of fifth lumbar vertebra, subsequent encounter for fracture with delayed healing: Secondary | ICD-10-CM | POA: Diagnosis not present

## 2021-02-18 NOTE — Progress Notes (Signed)
Remote pacemaker transmission.   

## 2021-02-18 NOTE — Progress Notes (Signed)
Occupational Therapy Session Note  Patient Details  Name: Todd Chapman MRN: [id number] Date of Birth: [birth year]  Today's Date: 02/18/2021 OT Individual Time: 0973-5329 OT Individual Time Calculation (min): 75 min    Short Term Goals: Week 1:  OT Short Term Goal 1 (Week 1): STG=LTG  Skilled Therapeutic Interventions/Progress Updates:    Pt resting in bed upon arrival. OT intervention with focus on functional amb with RW, toileting, bathing at shower level, dressing with sit<>stand from EOB, activity tolerance, and safety awareness to increase independence with BADLs. All functional tasks and amb at supervision level. Pt stood in shower approx 50% to time. Pt stood at toilet to urinate. Sit<>stand from lower toilet seat with CGA. Pt able to power up without physical assistance. Pt completes LB dressing with supervision while following back precautions. Pt used "assistive device" to don [employer]. Pt commented that bathing/dressing is somewhat exhausting. Pt remained in recliner with all needs within reach and seat alarm activated.   Therapy Documentation Precautions:  Precautions Precautions: Back, Fall Precaution Booklet Issued: Yes (comment) Restrictions Weight Bearing Restrictions: No Other Position/Activity Restrictions: Per Ortho consult, WBAT bil hips  Pain: Pt resports 7/10 back pain (chronic); activity, warm shower, and rest  Therapy/Group: Individual Therapy  Leroy Libman 02/18/2021, 10:32 AM

## 2021-02-18 NOTE — Progress Notes (Signed)
PROGRESS NOTE   Subjective/Complaints:   Pt reports pain doing better today- still 6-7/10 usually- said nursing went over with him when to ask for prns and is using when needed.  Said progressing nicely.  Mainly took prn to sleep so pain didn't impact sleep last night.  Using ICS but a few times per day.  Still occ cough, but better.    ROS:  Pt denies SOB, abd pain, CP, N/V/C/D, and vision changes   Objective:   DG Chest 2 View  Result Date: 02/17/2021 CLINICAL DATA:  Cough. EXAM: CHEST - 2 VIEW COMPARISON:  February 06, 2021. FINDINGS: Stable cardiomediastinal silhouette. Hypoinflation of the lungs is noted with minimal bibasilar subsegmental atelectasis. 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 Bony thorax is unremarkable. IMPRESSION: Hypoinflation of the lungs with minimal bibasilar subsegmental atelectasis. Electronically Signed   By: Marijo Conception M.D.   On: 02/17/2021 10:10   Recent Labs    02/17/21 0528  WBC 3.4*  HGB 9.2*  HCT 27.9*  PLT 247   Recent Labs    02/17/21 0528  NA 133*  K 4.1  CL 99  CO2 29  GLUCOSE 101*  BUN 13  CREATININE 0.96  CALCIUM 9.9    Intake/Output Summary (Last 24 hours) at 02/18/2021 0908 Last data filed at 02/17/2021 1847 Gross per 24 hour  Intake 720 ml  Output --  Net 720 ml        Physical Exam: Vital Signs Blood pressure (!) 152/97, pulse 71, temperature 98.4 F (36.9 C), resp. rate 18, height 6' 2.02" (1.88 m), weight 97.4 kg, SpO2 99 %.      General: awake, alert, appropriate, sitting up in bed; wife on phone; NAD HENT: conjugate gaze; oropharynx moist CV: regular rate- in afib; no JVD Pulmonary: CTA B/L; no W/R/R- good air movement- sounds great- very trace decreased at bases B/L GI: soft, NT, ND, (+)BS Psychiatric: appropriate- interactive Neurological: Ox3 Fewer delayed responses Skin: incision with honeycomb dressing and steristrips-  c/D/i Neuro: CN 2-12 intact. Alert and oriented x4.  Musculoskeletal: 5/5 strength, sensation intact.     Assessment/Plan: 1. Functional deficits which require 3+ hours per day of interdisciplinary therapy in a comprehensive inpatient rehab setting. Physiatrist is providing close team supervision and 24 hour management of active medical problems listed below. Physiatrist and rehab team continue to assess barriers to discharge/monitor patient progress toward functional and medical goals  Care Tool:  Bathing    Body parts bathed by patient: Right arm, Left arm, Chest, Abdomen, Front perineal area, Buttocks, Right upper leg, Left upper leg, Right lower leg, Left lower leg, Face         Bathing assist Assist Level: Set up assist (per pt report)     Upper Body Dressing/Undressing Upper body dressing   What is the patient wearing?: Pull over shirt, Orthosis    Upper body assist Assist Level: Supervision/Verbal cueing    Lower Body Dressing/Undressing Lower body dressing      What is the patient wearing?: Underwear/pull up, Pants     Lower body assist Assist for lower body dressing: Supervision/Verbal cueing     Toileting Toileting  Toileting assist Assist for toileting: Contact Guard/Touching assist     Transfers Chair/bed transfer  Transfers assist     Chair/bed transfer assist level: Contact Guard/Touching assist Chair/bed transfer assistive device: Programmer, multimedia   Ambulation assist      Assist level: Contact Guard/Touching assist Assistive device: Walker-rolling Max distance: 200'   Walk 10 feet activity   Assist     Assist level: Contact Guard/Touching assist Assistive device: Walker-rolling   Walk 50 feet activity   Assist Walk 50 feet with 2 turns activity did not occur: Safety/medical concerns  Assist level: Contact Guard/Touching assist Assistive device: Walker-rolling    Walk 150 feet activity   Assist Walk  150 feet activity did not occur: Safety/medical concerns  Assist level: Contact Guard/Touching assist Assistive device: Walker-rolling    Walk 10 feet on uneven surface  activity   Assist Walk 10 feet on uneven surfaces activity did not occur: Safety/medical concerns         Wheelchair     Assist Is the patient using a wheelchair?: No             Wheelchair 50 feet with 2 turns activity    Assist            Wheelchair 150 feet activity     Assist          Blood pressure (!) 152/97, pulse 71, temperature 98.4 F (36.9 C), resp. rate 18, height 6' 2.02" (1.88 m), weight 97.4 kg, SpO2 99 %.  Medical Problem List and Plan: 1. 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             -patient may shower but incision must be covered             -ELOS/Goals: 10-14 days S  con't CIR- PT and OT-pt would like to leave Friday  - explained to wife I don't think Saturday is needed for d/c- team conference today to determine length of stay.  2.  Antithrombotics: -DVT/anticoagulation:  Pharmaceutical: Other (comment) Eliquis             -antiplatelet therapy: none 3. Pain Management: oxycodone IR 10 mg every 4 hours; Tylenol 650 mg every 6 hours; Flexeril 10 mg TID PRN  12/9- changed scheduled oxycodone 10 mg to Oxycontin 15 mg BID and con't prn Oxy q3 hours prn. Pain doing better- con't new regimen  12/13- pain "progressing"- wife asking about when pain meds will be weaned so doesn't have difficulties with addiction.  4. Mood: LCSW to provide support             -antipsychotic agents: n/a 5. Neuropsych: This patient is capable of making decisions on his own behalf. 6. Skin/Wound Care: incision care:dry dressing and monitor; routine skin checks 7. Fluids/Electrolytes/Nutrition: routine ins and outs with follow-up chemistries 8. Persistent atrial  fibrillation: rate controlled; continue apixaban  12/13- Afib stable- rate controlle-d con't regimen 9. Dyslipidemia: continue Crestor 10 mg daily, heart healthy diet 10: History of prostate cancer; no evidence currently of metastases; completed radiation therapy and hormonal therapy [facility]). L5 biopsy with no evidence of metastasis. Continue calcitonin, Flomax. Check vitamin D level tomorrow.  12/9- Vit D level ok  at 39.3 11: mild normocytic, normochromic anemia: monitor CBC, repeat tomorrow 12 Cancer-related fatigue: discussed with him over the counter N-acetyl-cysteine 600mg  BID versus Modafinil and he prefers to start with  the former- he will ask his wife to purchase and advised him to give medication to nurse for [place] to place patient may use own med order. 13. HTN- DBP higher than expected  12/12- BP slightly labile- slightly elevated today- con't regimen 14. Hyponatremia  12/9- Na 130 down from 131- will recheck Saturday and if continues to drop, will place on fluid restrictions.   12/10- Na 130- will recheck weekly on qmonday.   12/12- Na up to 133 without fluid restrictions 15. Phlegm-y cough  12/12- will check CXR/ PA and lateral- shows minimal atelectasis- will order flutter valve and ask nursing to teach how to use. Reassure that lungs doing ok  12/13- explained needs to use ICS or flutter valve q2 hours while awake-    I spent a total of 36 minutes on total care today- >50% coordination of care- d/w pt, and wife about care and plan; also extra time spent on team conference.  LOS: 6 days A FACE TO FACE EVALUATION WAS PERFORMED  Abbygayle Helfand 02/18/2021, 9:08 AM

## 2021-02-18 NOTE — Progress Notes (Signed)
Physical Therapy Session Note  Patient Details  Name: Todd Chapman MRN: [id number] Date of Birth: [birth year]  Today's Date: 02/18/2021 PT Individual Time: 1600-1715 PT Individual Time Calculation (min): 75 min   Short Term Goals: Week 1:  PT Short Term Goal 1 (Week 1): Patient will recall 3/3 back precautions with no additional cuing PT Short Term Goal 2 (Week 1): Patient will complete bed <> wc transfer with LRAD and CGA PT Short Term Goal 3 (Week 1): Patient will ambulate >51ft with LRAD and CGA PT Short Term Goal 4 (Week 1): Patient will initiate stair training  Skilled Therapeutic Interventions/Progress Updates:    Pt received seated in recliner in room, agreeable to PT session. Pt reports 6/10 pain in R posterior hip region, receives pain medication at beginning of session. Sit to stand with Supervision to RW during session. Ambulatory transfer to toilet with RW and Supervision. Pt is Supervision for standing balance while toileting, independent for clothing management and pericare. Pt has increase in R posterior hip pain following toileting, requires seated rest break to recover. Ambulation x 150 ft with RW and Supervision. Remainder of session focus on BLE standing strengthening HEP, see below, handout provided. Pt returned to bed at end of session, needs in reach.  Access Code: [facility] URL: [email or web address] Date: 02/18/2021 Prepared by: Excell Seltzer  Exercises Mini Squat with Counter Support - 1 x daily - 7 x weekly - 3 sets - 10 reps Forward Step Up with Counter Support - 1 x daily - 7 x weekly - 3 sets - 10 reps Lateral Step Up with Counter Support - 1 x daily - 7 x weekly - 3 sets - 10 reps Side Stepping with Counter Support - 1 x daily - 7 x weekly - 3 sets - 20 reps    Therapy Documentation Precautions:  Precautions Precautions: Back, Fall Precaution Booklet Issued: Yes (comment) Restrictions Weight Bearing Restrictions: No Other  Position/Activity Restrictions: Per Ortho consult, WBAT bil hips       Therapy/Group: Individual Therapy   Excell Seltzer, PT, DPT, CSRS  02/18/2021, 5:30 PM

## 2021-02-18 NOTE — Progress Notes (Signed)
Physical Therapy Session Note  Patient Details  Name: Todd Chapman MRN: [id number] Date of Birth: 1945-09-10  Today's Date: 02/18/2021 PT Individual Time: 8527-7824 PT Individual Time Calculation (min): 43 min   Short Term Goals: Week 1:  PT Short Term Goal 1 (Week 1): Patient will recall 3/3 back precautions with no additional cuing PT Short Term Goal 2 (Week 1): Patient will complete bed <> wc transfer with LRAD and CGA PT Short Term Goal 3 (Week 1): Patient will ambulate >70ft with LRAD and CGA PT Short Term Goal 4 (Week 1): Patient will initiate stair training  Skilled Therapeutic Interventions/Progress Updates:  Pt received seated in recliner in room, reported 7/10 pain in B hips and back and declined pain meds. Offered repositioning and distraction throughout session for pain modulation. Pt requested to void, performed sit <>stand w/CGA and pt ambulated 20' to toilet w/RW and CGA. Pt voided continently, requested privacy which therapist granted. Pt ambulated 15' to sink w/CGA and RW and performed hand hygiene w/S*. Pt requested to focus on BUE strengthening during session. Pt ambulated >50' to day room gym w/RW and CGA. Noted mild kyphotic posture, narrow BOS and decreased step clearance bilaterally. No concurrent verbal cues provided as they distracted pt.   The following exercises were performed at edge of mat for improved shoulder stability, strength and core stabilization per pt request:  -Bicep curls w/contralateral isometric hold, 4x15 w/7# Dbs  -Seated Y's and T's w/4# Dbs, 2x10 per direction  -Overhead presses w/5# dowel, 2x10   Sit <>stand from mat w/CGA and pt ambulated >75' back to room w/RW and CGA. Pt was left seated in recliner in room, all needs in reach, declined pain meds.    Therapy Documentation Precautions:  Precautions Precautions: Back, Fall Precaution Booklet Issued: Yes (comment) Restrictions Weight Bearing Restrictions: No Other Position/Activity  Restrictions: Per Ortho consult, WBAT bil hips   Therapy/Group: Individual Therapy Cruzita Lederer Farrie Sann, PT, DPT  02/18/2021, 7:51 AM

## 2021-02-18 NOTE — Progress Notes (Signed)
Patient ID: Todd Chapman, male   DOB: Aug 22, 1945, 75 y.o.   MRN: [id number]  SW met with pt and pt wife conference call to provide updates from team conference, and d/c date 02/21/21. SW discussed discharge process. SW to order RW, and preferred outpatient location is [place] [facility] for outpatient PT.   SW ordered RW with [employer] via parachute.   Loralee Pacas, MSW, [place] Office: [phone] Cell: [phone] Fax: [phone]

## 2021-02-18 NOTE — Progress Notes (Signed)
Occupational Therapy Session Note  Patient Details  Name: Todd Chapman MRN: [id number] Date of Birth: 1945-03-16  Today's Date: 02/18/2021 OT Individual Time: 1130-1200 OT Individual Time Calculation (min): 30 min    Short Term Goals: Week 1:  OT Short Term Goal 1 (Week 1): STG=LTG  Skilled Therapeutic Interventions/Progress Updates:    Pt resting in recliner upon arrival. Pt provided with [employer] for pt to purchase for 2nd RW at home. OT intervention with focus on functional amb with RW, sit<>stand, safety awareness, and activity tolerance. Sit<>stand without UE support with supervision. Pt reported increased pain initially with amb to [place]; relieved after rest in arm chair. Pt amb with RW to main gym and rested. Pt amb with RW back to room and returned to recliner. Pt remained in relciner with all needs within reach. Seat alarm activated.   Therapy Documentation Precautions:  Precautions Precautions: Back, Fall Precaution Booklet Issued: Yes (comment) Restrictions Weight Bearing Restrictions: No Other Position/Activity Restrictions: Per Ortho consult, WBAT bil hips  Pain:  Pt reports pain at 7/10 (chronic) but increased with ambulation; relieved with resting in chair   Therapy/Group: Individual Therapy  Leroy Libman 02/18/2021, 12:02 PM

## 2021-02-19 MED ORDER — OXYCODONE HCL 5 MG PO TABS: 5.0000 mg | ORAL_TABLET | Freq: Four times a day (QID) | ORAL | 0 refills | Status: DC | PRN

## 2021-02-19 MED ORDER — METHOCARBAMOL 500 MG PO TABS: 500.0000 mg | ORAL_TABLET | Freq: Four times a day (QID) | ORAL | Status: DC | PRN

## 2021-02-19 MED ORDER — OXYCODONE HCL ER 15 MG PO T12A: 15.0000 mg | EXTENDED_RELEASE_TABLET | Freq: Two times a day (BID) | ORAL | 0 refills | Status: DC

## 2021-02-19 NOTE — Progress Notes (Signed)
Physical Therapy Session Note  Patient Details  Name: Todd Chapman MRN: [id number] Date of Birth: [birth year]  Today's Date: 02/19/2021 PT Individual Time: 1000-1100 PT Individual Time Calculation (min): 60 min   Short Term Goals: Week 1:  PT Short Term Goal 1 (Week 1): Patient will recall 3/3 back precautions with no additional cuing PT Short Term Goal 2 (Week 1): Patient will complete bed <> wc transfer with LRAD and CGA PT Short Term Goal 3 (Week 1): Patient will ambulate >13ft with LRAD and CGA PT Short Term Goal 4 (Week 1): Patient will initiate stair training  Skilled Therapeutic Interventions/Progress Updates:    Pt received seated in w/c in room, agreeable to PT session. Pt reports urge to urinate. Sit to stand with no AD and CGA. Ambulatory transfer into bathroom with no AD and CGA to trial mobility without AD. Pt exhibits shuffling gait pattern and "furniture-walking" to reach bathroom without RW. Standing balance while toileting at mod I level. Pt returns to sitting in w/c with no AD and CGA. Pt in agreement with therapist that it is best to continue to use RW for increased safety and balance and decreased pain with mobility. Pt has onset of R posterior hip pain following short distance gait with no AD, able to receive pain medication from nursing. Ambulation 2 x 200 ft with RW at Supervision level. Pt's RW he will d/c home with arrives and adjusted to appropriate height for patient. Ascend/descend 12 x 6" stairs in stairwell with R handrail at Supervision level, simulating RW management at top/bottom of stairs and taking a break on a chair on the landing. Pt demonstrates good understanding of sequencing and safety regarding stair navigation. Pt left seated in w/c in room with needs in reach, chair alarm in place at end of session.  Therapy Documentation Precautions:  Precautions Precautions: Back, Fall Precaution Booklet Issued: Yes (comment) Restrictions Weight Bearing  Restrictions: No Other Position/Activity Restrictions: Per Ortho consult, WBAT bil hips     Therapy/Group: Individual Therapy   Excell Seltzer, PT, DPT, CSRS  02/19/2021, 2:04 PM

## 2021-02-19 NOTE — Progress Notes (Signed)
Physical Therapy Discharge Summary  Patient Details  Name: Todd Chapman MRN: [id number] Date of Birth: 09-27-1945  Today's Date: 02/20/2021  Patient has met 9 of 9 long term goals due to improved activity tolerance, improved balance, increased strength, decreased pain, and improved coordination.  Patient to discharge at an ambulatory level Modified Independent with RW.   Patient's care partner is independent to provide the necessary physical and cognitive assistance at discharge. Pt's wife [name] has completed hands-on family education and training.  Reasons goals not met: N/A all goals met  Recommendation:  Patient will benefit from ongoing skilled PT services in outpatient setting to continue to advance safe functional mobility, address ongoing impairments in endurance, strength, balance, safety, independence with functional mobility, pain management, and minimize fall risk.  Equipment: RW  Reasons for discharge: treatment goals met and discharge from hospital  Patient/family agrees with progress made and goals achieved: Yes  PT Discharge Precautions/Restrictions Precautions Precautions: Back;Fall Precaution Booklet Issued: Yes (comment) Restrictions Weight Bearing Restrictions: No Other Position/Activity Restrictions: Per Ortho consult, WBAT bil hips Vital Signs Therapy Vitals Temp: 98 F (36.7 C) Temp Source: Oral Pulse Rate: 74 Resp: 16 BP: 120/73 Patient Position (if appropriate): Sitting Oxygen Therapy SpO2: 98 % O2 Device: Room Air Pain Pain Assessment Pain Scale: 0-10 Pain Score: 3  Pain Interference Pain Interference Pain Effect on Sleep: 3. Frequently Pain Interference with Therapy Activities: 2. Occasionally Pain Interference with Day-to-Day Activities: 1. Rarely or not at all Vision/Perception  Vision - History Ability to See in Adequate Light: 0 Adequate Perception Perception: Within Functional Limits Praxis Praxis: Intact  Cognition Overall  Cognitive Status: Within Functional Limits for tasks assessed Arousal/Alertness: Awake/alert Orientation Level: Oriented X4 Year: 2022 Month: December Day of Week: Correct Attention: Selective Selective Attention: Appears intact Memory: Appears intact Immediate Memory Recall: Sock;Blue;Bed Memory Recall Sock: Without Cue Memory Recall Blue: Without Cue Memory Recall Bed: Without Cue Awareness: Appears intact Problem Solving: Appears intact Safety/Judgment: Appears intact Sensation Sensation Light Touch: Appears Intact Hot/Cold: Appears Intact Proprioception: Appears Intact Stereognosis: Appears Intact Coordination Gross Motor Movements are Fluid and Coordinated: Yes Fine Motor Movements are Fluid and Coordinated: Yes Finger Nose Finger Test: [facility] Motor  Motor Motor: Abnormal postural alignment and control Motor - Discharge Observations: mild weakness noted, improved from eval  Mobility Bed Mobility Bed Mobility: Rolling Right;Rolling Left;Supine to Sit;Sit to Supine Rolling Right: Independent Rolling Left: Independent Supine to Sit: Independent Sit to Supine: Independent Transfers Transfers: Sit to Stand;Stand to Sit;Stand Pivot Transfers Sit to Stand: Independent with assistive device Stand to Sit: Independent with assistive device Stand Pivot Transfers: Independent with assistive device Transfer (Assistive device): Rolling walker Locomotion  Gait Ambulation: Yes Gait Assistance: Supervision/Verbal cueing Gait Distance (Feet): 300 Feet Assistive device: Rolling walker Gait Gait: Yes Gait Pattern: Impaired Gait Pattern: Step-through pattern;Poor foot clearance - right;Poor foot clearance - left;Trunk flexed (improved trunk flexion from day of eval) Stairs / Additional Locomotion Stairs: Yes Stairs Assistance: Supervision/Verbal cueing Stair Management Technique: One rail Right Number of Stairs: 12 Height of Stairs: 6 Ramp: Supervision/Verbal cueing Curb:  Supervision/Verbal cueing Pick up small object from the floor assist level: Independent with assistive device Wheelchair Mobility Wheelchair Mobility: No  Trunk/Postural Assessment  Cervical Assessment Cervical Assessment: Within Functional Limits Thoracic Assessment Thoracic Assessment: Exceptions to [facility] (rounded shoulders) Lumbar Assessment Lumbar Assessment: Exceptions to [facility] (posterior pelvic tilt) Postural Control Postural Control: Deficits on evaluation  Balance Balance Balance Assessed: Yes Static Sitting Balance Static Sitting - Balance  Support: Feet supported Static Sitting - Level of Assistance: 7: Independent Dynamic Sitting Balance Dynamic Sitting - Balance Support: During functional activity;Feet supported Dynamic Sitting - Level of Assistance: 6: Modified independent (Device/Increase time) Static Standing Balance Static Standing - Balance Support: During functional activity;Bilateral upper extremity supported Static Standing - Level of Assistance: 6: Modified independent (Device/Increase time) Dynamic Standing Balance Dynamic Standing - Balance Support: During functional activity;Bilateral upper extremity supported Dynamic Standing - Level of Assistance: 6: Modified independent (Device/Increase time) Extremity Assessment  RUE Assessment RUE Assessment: Within Functional Limits LUE Assessment LUE Assessment: Within Functional Limits RLE Assessment RLE Assessment: Exceptions to [facility] General Strength Comments: grossly 4+/5 LLE Assessment LLE Assessment: Exceptions to [facility] General Strength Comments: Grossly 4+/5    Rosita DeChalus 02/20/2021, 4:28 PM

## 2021-02-19 NOTE — Patient Care Conference (Signed)
Inpatient RehabilitationTeam Conference and Plan of Care Update Date: 01/19/2021   Time: 11:06 AM    Patient Name: Todd Chapman      Medical Record Number: [id number]  Date of Birth: January 20, 1946 Sex: Male         Room/Bed: 4W12C/4W12C-02 Payor Info: Payor: MEDICARE / Plan: MEDICARE PART A AND B / Product Type: *No Product type* /    Admit Date/Time:  02/12/2021 11:17 PM  Primary Diagnosis:  Vertebral compression fracture [facility])  Hospital Problems: Principal Problem:   Vertebral compression fracture [facility])    Expected Discharge Date: Expected Discharge Date: 02/21/21  Team Members Present: Physician leading conference: Dr. Courtney Heys Social Worker Present: Loralee Pacas, [place] Nurse Present: Dorthula Nettles, RN PT Present: Excell Seltzer, PT OT Present: Roanna Epley, COTA;Jennifer Tamala Julian, OT PPS Coordinator present : Gunnar Fusi, SLP     Current Status/Progress Goal Weekly Team Focus  Bowel/Bladder   continent b/b  remain continent  assess toileting needs as needed   Swallow/Nutrition/ Hydration             ADL's   bathing-supervision; UB/LB dressing-supervision; tranfsers-CGA  mod I overall  education; safety awareness, activity tolerance, functional transfers   Mobility   CGA overall with RW  Supervision to mod I  family education, d/c planning, safety awareness, pain management   Communication             Safety/Cognition/ Behavioral Observations            Pain   scheduled pain medication working appropriately  <3  assess pain q 4hr and prn   Skin   CDI  remain free from breakdown  assess skin q 4hr and prn     Discharge Planning:  D/c to home with 24/7 care from pt wife.   Team Discussion: Main issue is atelectasis. Encourage to use incentive spirometer more often. Memory is improving. Scheduled medications working for pain. Will have intermittent supervision at discharge. Supervision/contact guard currently. Spouse has attended family education.  Ambulating to bathroom. Following back precautions.  Patient on target to meet rehab goals: yes, supervision goals for PT and mod I goals for OT.  *See Care Plan and progress notes for long and short-term goals.   Revisions to Treatment Plan:  Finalizing discharge orders.  Teaching Needs: Family education, medication/pain management, safety awareness, transfer/gait training, etc.  Current Barriers to Discharge: Decreased caregiver support, Home enviroment access/layout, and Weight bearing restrictions  Possible Resolutions to Barriers: Family education Order DME Follow-up PT/OT     Medical Summary Current Status: no surgery to fix pain issues - d/w'd pt- continent B/B- on Oxycontin and prn oxycodone- skin OK- atelectasis-  Barriers to Discharge: Decreased family/caregiver support;Behavior;Home enviroment access/layout;Medical stability;Other (comments);Weight bearing restrictions  Barriers to Discharge Comments: needs ICS/flutter valve- is ordered- goals mod I to superviison- wife needs him at  intermittent supervision. Possible Resolutions to [employer] Focus: wife did family education- yesterday- on track for goals- confusion better-  Friday 02/21/21 for d/c.   Continued Need for Acute Rehabilitation Level of Care: The patient requires daily medical management by a physician with specialized training in physical medicine and rehabilitation for the following reasons: Direction of a multidisciplinary physical rehabilitation program to maximize functional independence : Yes Medical management of patient stability for increased activity during participation in an intensive rehabilitation regime.: Yes Analysis of laboratory values and/or radiology reports with any subsequent need for medication adjustment and/or medical intervention. : Yes   I attest that I was  present, lead the team conference, and concur with the assessment and plan of the team.   Cristi Loron 02/19/2021, 12:55 PM

## 2021-02-19 NOTE — Progress Notes (Signed)
I confirmed with patient's pharmacy [facility] [place] that they have oxycodone dosages available. The following were electronically prescribed today in anticipation of discharge on Friday, February 21, 2021:  Oxycodone 15 mg immediate release BID #14 Oxycodone 5 mg immediate release q 6 hours prn #28

## 2021-02-19 NOTE — Progress Notes (Signed)
PROGRESS NOTE   Subjective/Complaints:   Pt reports 4 hours/sleep - per nursing poor sleep due to concern about working or ability to work/pain meds due to pain.   Also notes has sharp shooting pains when takes a few steps in hips/low back- hasn't been taking Flexeril since it makes him too sedated.  Will switch to Robaxin/   ROS:  Pt denies SOB, abd pain, CP, N/V/C/D, and vision changes    Objective:   DG Chest 2 View  Result Date: 02/17/2021 CLINICAL DATA:  Cough. EXAM: CHEST - 2 VIEW COMPARISON:  February 06, 2021. FINDINGS: Stable cardiomediastinal silhouette. Hypoinflation of the lungs is noted with minimal bibasilar subsegmental atelectasis. 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 Bony thorax is unremarkable. IMPRESSION: Hypoinflation of the lungs with minimal bibasilar subsegmental atelectasis. Electronically Signed   By: Marijo Conception M.D.   On: 02/17/2021 10:10   Recent Labs    02/17/21 0528  WBC 3.4*  HGB 9.2*  HCT 27.9*  PLT 247   Recent Labs    02/17/21 0528  NA 133*  K 4.1  CL 99  CO2 29  GLUCOSE 101*  BUN 13  CREATININE 0.96  CALCIUM 9.9    Intake/Output Summary (Last 24 hours) at 02/19/2021 0815 Last data filed at 02/18/2021 2357 Gross per 24 hour  Intake 240 ml  Output 750 ml  Net -510 ml        Physical Exam: Vital Signs Blood pressure (!) 155/89, pulse 63, temperature 98.7 F (37.1 C), resp. rate 16, height 6' 2.02" (1.88 m), weight 93.1 kg, SpO2 98 %.       General: awake, alert, appropriate, sitting up in bed;  NAD HENT: conjugate gaze; oropharynx moist CV: regular rate; no JVD Pulmonary: CTA B/L; no W/R/R- good air movement GI: soft, NT, ND, (+)BS Psychiatric: appropriate- interactive Neurological: Ox3 Very sharp this AM Skin: incision  with steristrips in place Neuro: CN 2-12 intact. Alert and oriented x4.  Musculoskeletal: 5/5 strength, sensation intact.      Assessment/Plan: 1. Functional deficits which require 3+ hours per day of interdisciplinary therapy in a comprehensive inpatient rehab setting. Physiatrist is providing close team supervision and 24 hour management of active medical problems listed below. Physiatrist and rehab team continue to assess barriers to discharge/monitor patient progress toward functional and medical goals  Care Tool:  Bathing    Body parts bathed by patient: Right arm, Left arm, Chest, Abdomen, Front perineal area, Buttocks, Right upper leg, Left upper leg, Right lower leg, Left lower leg, Face         Bathing assist Assist Level: Supervision/Verbal cueing     Upper Body Dressing/Undressing Upper body dressing   What is the patient wearing?: Pull over shirt, Orthosis    Upper body assist Assist Level: Supervision/Verbal cueing    Lower Body Dressing/Undressing Lower body dressing      What is the patient wearing?: Underwear/pull up, Pants     Lower body assist Assist for lower body dressing: Supervision/Verbal cueing     Toileting Toileting    Toileting assist Assist for toileting: Supervision/Verbal cueing     Transfers Chair/bed transfer  Transfers assist  Chair/bed transfer assist level: Supervision/Verbal cueing Chair/bed transfer assistive device: Museum/gallery exhibitions officer assist      Assist level: Supervision/Verbal cueing Assistive device: Walker-rolling Max distance: 150'   Walk 10 feet activity   Assist     Assist level: Supervision/Verbal cueing Assistive device: Walker-rolling   Walk 50 feet activity   Assist Walk 50 feet with 2 turns activity did not occur: Safety/medical concerns  Assist level: Supervision/Verbal cueing Assistive device: Walker-rolling    Walk 150 feet activity   Assist Walk 150 feet activity did not occur: Safety/medical concerns  Assist level: Supervision/Verbal cueing Assistive device:  Walker-rolling    Walk 10 feet on uneven surface  activity   Assist Walk 10 feet on uneven surfaces activity did not occur: Safety/medical concerns         Wheelchair     Assist Is the patient using a wheelchair?: No             Wheelchair 50 feet with 2 turns activity    Assist            Wheelchair 150 feet activity     Assist          Blood pressure (!) 155/89, pulse 63, temperature 98.7 F (37.1 C), resp. rate 16, height 6' 2.02" (1.88 m), weight 93.1 kg, SpO2 98 %.  Medical Problem List and Plan: 1. 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             -patient may shower but incision must be covered             -ELOS/Goals: 10-14 days S  12/14- d/c 12/16/Friday- con't CIR- PT and OT- discussed d/c plans- as well as pain meds and will pick up meds at his pharmacy. Will get 7 days of pain meds- then call clinic for refill of 30 days.  2.  Antithrombotics: -DVT/anticoagulation:  Pharmaceutical: Other (comment) Eliquis             -antiplatelet therapy: none 3. Pain Management: oxycodone IR 10 mg every 4 hours; Tylenol 650 mg every 6 hours; Flexeril 10 mg TID PRN  12/9- changed scheduled oxycodone 10 mg to Oxycontin 15 mg BID and con't prn Oxy q3 hours prn. Pain doing better- con't new regimen  12/14- will switch Flexeril to Robaxin 500 mg q6 hours prn- asked pt to ask for it.  4. Mood: LCSW to provide support             -antipsychotic agents: n/a 5. Neuropsych: This patient is capable of making decisions on his own behalf. 6. Skin/Wound Care: incision care:dry dressing and monitor; routine skin checks 7. Fluids/Electrolytes/Nutrition: routine ins and outs with follow-up chemistries 8. Persistent atrial fibrillation: rate controlled; continue apixaban  12/13- Afib stable- rate controlle-d con't regimen 9. Dyslipidemia: continue Crestor  10 mg daily, heart healthy diet 10: History of prostate cancer; no evidence currently of metastases; completed radiation therapy and hormonal therapy [facility]). L5 biopsy with no evidence of metastasis. Continue calcitonin, Flomax. Check vitamin D level tomorrow.  12/9- Vit D level ok  at 39.3 11: mild normocytic, normochromic anemia: monitor CBC, repeat tomorrow 12 Cancer-related fatigue: discussed with him over the counter N-acetyl-cysteine 600mg  BID versus Modafinil and he prefers to start with the former- he will ask his wife to purchase and advised him to give medication to nurse for [place] to  place patient may use own med order. 13. HTN- DBP higher than expected  112/14- BP 155/89 this AM- which is high for him- likely due to pain- will wait, since usually controlled to increase meds 14. Hyponatremia  12/9- Na 130 down from 131- will recheck Saturday and if continues to drop, will place on fluid restrictions.   12/10- Na 130- will recheck weekly on qmonday.   12/12- Na up to 133 without fluid restrictions 15. Phlegm-y cough  12/12- will check CXR/ PA and lateral- shows minimal atelectasis- will order flutter valve and ask nursing to teach how to use. Reassure that lungs doing ok  12/13- explained needs to use ICS or flutter valve q2 hours while awake-   12/14- sounds clear- less coughing.     LOS: 7 days A FACE TO FACE EVALUATION WAS PERFORMED  Audia Amick 02/19/2021, 8:15 AM

## 2021-02-19 NOTE — Progress Notes (Signed)
Physical Therapy Session Note  Patient Details  Name: Todd Chapman MRN: [id number] Date of Birth: Sep 18, 1945  Today's Date: 02/19/2021 PT Individual Time: 4239-5320 PT Individual Time Calculation (min): 42 min   Short Term Goals: Week 1:  PT Short Term Goal 1 (Week 1): Patient will recall 3/3 back precautions with no additional cuing PT Short Term Goal 2 (Week 1): Patient will complete bed <> wc transfer with LRAD and CGA PT Short Term Goal 3 (Week 1): Patient will ambulate >86ft with LRAD and CGA PT Short Term Goal 4 (Week 1): Patient will initiate stair training  Skilled Therapeutic Interventions/Progress Updates: Pt presented in recliner agreeable to therapy. Pt states pain 7-8/10 with activity, no pain meds requested during session. Pt noted to have recently used urinal therefore pt performed Sit to stand from recliner to pull pants over hips. Pt requesting to work on BLE strengthening. Pt ambulated to rehab gym with RW and close supervision with slight forward flexed posture noted. In parallel bars pt performed seated LAQ with 2lb cuff, seated march, standing hip abd/add, standing hamstring curls 2 x 10 each. Pt then performed sidestepping with orange resistance band at hips 63ft x 3 each direction. During rest breaks pt inquiring about healing time and states will go to orthopedic regarding any additional intervention for hip fx. Discussed with pt as currently WBAT will continue to heal without surgical intervention however always pt's right to get another opinion. Pt ambulated back to room in same manner as prior to returned to recliner. Pt left in recliner with call bell within reach and needs met.      Therapy Documentation Precautions:  Precautions Precautions: Back, Fall Precaution Booklet Issued: Yes (comment) Restrictions Weight Bearing Restrictions: No Other Position/Activity Restrictions: Per Ortho consult, WBAT bil hips General:   Vital Signs: Therapy Vitals Temp: 98.2  F (36.8 C) Temp Source: Oral Pulse Rate: 75 Resp: 15 BP: 117/77 Patient Position (if appropriate): Sitting Oxygen Therapy SpO2: 98 % O2 Device: Room Air Pain:   Mobility:   Locomotion :    Trunk/Postural Assessment :    Balance:   Exercises:   Other Treatments:      Therapy/Group: Individual Therapy  Dorthea Maina 02/19/2021, 4:02 PM

## 2021-02-19 NOTE — Progress Notes (Signed)
Occupational Therapy Session Note  Patient Details  Name: PREVIN JIAN MRN: [id number] Date of Birth: 10/06/1945  Today's Date: 02/19/2021 OT Individual Time: 7867-6720 OT Individual Time Calculation (min): 70 min    Short Term Goals: Week 1:  OT Short Term Goal 1 (Week 1): STG=LTG  Skilled Therapeutic Interventions/Progress Updates:    OT interventin with focus on toileting, bathing at shower level, and dressing with sit<>stand from EOB. Amb in room with RW at supervision level. Bahting with sit<>stand in shower with supervsion. Pt stood approx 50% of time. Pt stood at toilet to urinate with supervsion. Pt returned to EOB and completed dressing tasks with supervision. Pt elected to remain in w/c with seat alarm activated. Ongoing education on home safety.   Therapy Documentation Precautions:  Precautions Precautions: Back, Fall Precaution Booklet Issued: Yes (comment) Restrictions Weight Bearing Restrictions: No Other Position/Activity Restrictions: Per Ortho consult, WBAT bil hips Pain: Pain Assessment Pain Scale: 0-10 Pain Score: 7  Pain Type: Chronic pain Pain Location: Hip Pain Orientation: Right Pain Descriptors / Indicators: Aching;Constant;Crushing Pain Frequency: Intermittent Patients Stated Pain Goal: 2 Pain Intervention(s): Rest Multiple Pain Sites: No   Therapy/Group: Individual Therapy  Leroy Libman 02/19/2021, 9:36 AM

## 2021-02-19 NOTE — Discharge Summary (Signed)
Bilateral acetabular/supra acetabular stress fractures of the pelvisPhysician Discharge Summary  Patient ID: ISAIS KLIPFEL MRN: [id number] DOB/AGE: October 15, 1945 75 y.o.  Admit date: 02/12/2021 Discharge date: 02/21/2021  Discharge Diagnoses:  Principal Problem:   Vertebral compression fracture (HCC) Principle Problem: Functional deficits due to vertebral body compression fracture Active Problems:  Acute on chronic back pain Inferior endplate compression deformity involving L5 vertebral body Bilateral acetabular/supra-acetabular stress fractures of the pelvis Prostate cancer Persistent atrial fibrillation on anticoagulation Dyslipidemia Anemia Cancer-related fatigue Elevated diastolic BP Hyponatremia Cough   Discharged Condition: good  Significant Diagnostic Studies: DG Chest 2 View  Result Date: 02/17/2021 CLINICAL DATA:  Cough. EXAM: CHEST - 2 VIEW COMPARISON:  February 06, 2021. FINDINGS: Stable cardiomediastinal silhouette. Hypoinflation of the lungs is noted with minimal bibasilar subsegmental atelectasis. 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 Bony thorax is unremarkable. IMPRESSION: Hypoinflation of the lungs with minimal bibasilar subsegmental atelectasis. Electronically Signed   By: Marijo Conception M.D.   On: 02/17/2021 10:10   DG Lumbar Spine 2-3 Views  Result Date: 02/10/2021 CLINICAL DATA:  Kyphoplasty EXAM: LUMBAR SPINE - 2-3 VIEW COMPARISON:  MRI 02/07/2021 FINDINGS: Single low resolution intraoperative spot view of the lumbar spine. Total fluoroscopy time was 2 minutes 28 seconds. The image demonstrates vertebral augmentation at L5. IMPRESSION: Intraoperative fluoroscopic assistance provided during kyphoplasty Electronically Signed   By: Donavan Foil M.D.   On: 02/10/2021 16:15   MR Lumbar Spine W Wo Contrast  Result Date: 02/07/2021 CLINICAL DATA:  Low back pain, infection suspected; compression fracture on recent CT, history of prostate cancer EXAM: MRI  LUMBAR SPINE WITHOUT AND WITH CONTRAST TECHNIQUE: Multiplanar and multiecho pulse sequences of the lumbar spine were obtained without and with intravenous contrast. CONTRAST:  71mL GADAVIST GADOBUTROL 1 MMOL/ML IV SOLN COMPARISON:  Correlation made with CT 02/06/2021 FINDINGS: Motion artifact is present. Segmentation: Standard. Alignment:  Grade 1 anterolisthesis at L4-L5. Vertebrae: L5 inferior endplate compression fracture with less than 50% loss of height. Marrow edema and enhancement are present. Minimal adjacent ventral epidural enhancement. There is mild marrow edema associated with L5-S1 facet arthropathy. Degenerative endplate irregularity at L4-L5. Schmorl's node at superior L1 endplate with adjacent STIR hyperintensity and enhancement. Conus medullaris and cauda equina: Conus extends to the L1 level. Conus and cauda equina appear normal. Paraspinal and other soft tissues: Unremarkable. No significant paraspinal edema or mass. Disc levels: L1-L2:  No stenosis. L2-L3: Minimal disc bulge with endplate osteophytic ridging. Mild facet arthropathy. No stenosis. L3-L4: Minimal disc bulge with endplate osteophytic ridging. Mild facet arthropathy. No stenosis. L4-L5: Anterolisthesis with uncovering of disc. Marked facet arthropathy with ligamentum flavum infolding and joint effusions. Small extra-spinal synovial cyst formation. Mild canal stenosis. Moderate right and minor left foraminal stenosis. L5-S1: Moderate facet arthropathy with ligamentum flavum infolding and joint effusions. Small extra-spinal synovial cyst formation. No stenosis. IMPRESSION: Acute to subacute L5 inferior endplate compression fracture with less than 50% loss of height and no significant retropulsion. No definite evidence of underlying lesion. Given prostate cancer history, a follow-up study is recommended. Abnormal signal and enhancement adjacent to L1 Schmorl's node is likely degenerative but can also be reevaluated at the time of  follow-up. Facet predominant degenerative changes as detailed above. Electronically Signed   By: Macy Mis M.D.   On: 02/07/2021 15:50   MR PELVIS W WO CONTRAST  Result Date: 02/07/2021 CLINICAL DATA:  Low back and pelvic pain. History of prostate cancer EXAM: MRI PELVIS WITHOUT CONTRAST TECHNIQUE: Multiplanar multisequence MR  imaging of the pelvis was performed. No intravenous contrast was administered. Multiplanar multisequence MR imaging of the pelvis was performed both before and after administration of intravenous contrast. Compatible pacemaker noted, manufacturer guidelines followed for scanning. Please note that today's exam was performed using routine MRI musculoskeletal pelvis protocol, and a dedicated prostate MRI was not performed. COMPARISON:  CT scan 02/06/2021 FINDINGS: Bones:Inferior endplate compression fracture at L5 is observed for example on image 35 series 17, please see report from dedicated lumbar spine MRI. Bilateral acetabular stress fractures primarily involving the acetabular roof but also the acetabular walls as shown on images 10 through 15 of series 15. In addition, there is subtle focal edema in the left pubic bone and adjacent superior ramus from stress fracture or nondisplaced fracture. The prior hip MRI from 11/04/2020 there was a left acetabular stress fracture, currently there is also symmetric involvement of the right side. Given the morphology and appearance I am skeptical of underlying osseous metastatic disease. Articular cartilage and labrum Articular cartilage:  Degenerative chondral thinning in both hips. Labrum:  N/A Joint or bursal effusion Joint effusion: Absent Bursae: Fluid tracks along the deep surface of the iliacus muscles bilaterally, a nonspecific finding which can be associated with other regional pelvic abnormalities. Muscles and tendons Minimal accentuated T2 signal anteriorly in the gluteus minimus and medius muscles in a symmetric manner, without  associated significant abnormal enhancement, likely incidental. There is low-grade edema in the hip adductor musculature for example on image 19 series 15 in a symmetric manner. Other findings Minimal presacral edema. No prostatomegaly. Sigmoid colon diverticulosis. Despite efforts by the technologist and patient, motion artifact is present on today's exam and could not be eliminated. This reduces exam sensitivity and specificity. IMPRESSION: 1. Bilateral acetabular/supra-acetabular stress fractures in the pelvis. There is likewise some stress fracture involving the left medial superior pubic ramus. This overall represents a progression compared to the previous unilateral left acetabular stress fracture shown on 11/04/2020. 2. L5 compression fracture as reported on recent MRI. 3. There is some low-grade edema along pelvic musculature, most notably the hip adductor musculature and along the deep side of the iliacus muscles. Electronically Signed   By: Van Clines M.D.   On: 02/07/2021 16:17   CT ABDOMEN PELVIS W CONTRAST  Result Date: 02/06/2021 CLINICAL DATA:  Abdominal pain EXAM: CT ABDOMEN AND PELVIS WITH CONTRAST TECHNIQUE: Multidetector CT imaging of the abdomen and pelvis was performed using the standard protocol following bolus administration of intravenous contrast. CONTRAST:  141mL OMNIPAQUE IOHEXOL 300 MG/ML  SOLN COMPARISON:  06/01/2020 FINDINGS: Lower chest: Patchy areas of ground-glass and airspace attenuation identified within the right middle lobe and right lower which appears similar to 12/16/2020. Small scattered lung nodules are again noted imaged portions of the right lung. Hepatobiliary: No focal liver abnormality. Moderate gallbladder distension. No gallstones, gallbladder wall thickening or pericholecystic inflammation. No significant bile duct dilatation. Pancreas: Unremarkable. No pancreatic ductal dilatation or surrounding inflammatory changes. Spleen: Normal in size without focal  abnormality. Adrenals/Urinary Tract: Normal adrenal glands. The kidneys are unremarkable. No mass, nephrolithiasis or hydronephrosis. Urinary bladder is unremarkable. Stomach/Bowel: Stomach appears normal. Status post appendectomy. No pathologic dilatation of the large or small bowel loops to suggest recurrent bowel obstruction. Distal colonic diverticula noted without signs of acute diverticulitis. Vascular/Lymphatic: Aortic atherosclerosis without aneurysm. No signs of abdominopelvic adenopathy. Reproductive: Fiducial markers identified within the prostate gland. Other: No free fluid or fluid collections. Musculoskeletal: The bones appear osteopenic. Degenerative disc disease is identified at  L4-5 and L5-S1. New inferior endplate compression deformity is noted involving the L5 vertebral body, image 119/6. IMPRESSION: 1. No acute findings identified within the abdomen or pelvis. No signs of recurrent bowel obstruction. 2. Ground-glass and airspace densities are noted within the right middle lobe and right lower lobe with scattered small lung nodules. Findings are similar to 12/16/2020 and are favored to represent sequelae of atypical infectious process. 3. New inferior endplate compression deformity involving the L5 vertebral body. 4. Aortic Atherosclerosis (ICD10-I70.0). Electronically Signed   By: Kerby Moors M.D.   On: 02/06/2021 16:30   CT L-SPINE NO CHARGE  Result Date: 02/06/2021 CLINICAL DATA:  Low back pain EXAM: CT LUMBAR SPINE WITHOUT CONTRAST TECHNIQUE: Multidetector CT imaging of the lumbar spine was performed without intravenous contrast administration. Multiplanar CT image reconstructions were also generated. COMPARISON:  Correlation made with CT abdomen March 2022 FINDINGS: Segmentation: 5 lumbar type vertebrae. Alignment: Similar grade 1 anterolisthesis at L4-L5. Vertebrae: There is new loss of height at the inferior endplate of L5. Height loss is less than 50%. Chronic large Schmorl's node  of the L1 superior endplate. Paraspinal and other soft tissues: Extra-spinal findings are better evaluated on concurrent dedicated imaging. Disc levels: Multilevel disc space narrowing. Vacuum disc phenomena at L4-L5 and L5-S1. Facet hypertrophy is greatest at L4-L5 and L5-S1. There is no high-grade canal narrowing identified. Mild foraminal narrowing at lower lumbar levels. IMPRESSION: Age-indeterminate compression fracture at the inferior endplate of L5 with less than 50% loss height (new since March 2022). Degenerative changes without high-grade stenosis. Lower lumbar facet degeneration. Electronically Signed   By: Macy Mis M.D.   On: 02/06/2021 16:26   DG Chest Port 1 View  Result Date: 02/06/2021 CLINICAL DATA:  Fever. EXAM: PORTABLE CHEST 1 VIEW COMPARISON:  August 13, 2020. FINDINGS: Stable cardiomediastinal silhouette. 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 Decreased right basilar opacity is noted suggesting improving atelectasis or infiltrate left lung is clear. Bony thorax is unremarkable. IMPRESSION: Improved right basilar opacity as described above. Electronically Signed   By: Marijo Conception M.D.   On: 02/06/2021 14:32   DG C-Arm 1-60 Min-No Report  Result Date: 02/10/2021 Fluoroscopy was utilized by the requesting physician.  No radiographic interpretation.   CUP PACEART REMOTE DEVICE CHECK  Result Date: 02/10/2021 Scheduled remote reviewed. Normal device function.  Next remote 91 days. LR   Labs:  Basic Metabolic Panel: Recent Labs  Lab 02/15/21 0503 02/17/21 0528  NA 130* 133*  K 4.2 4.1  CL 96* 99  CO2 28 29  GLUCOSE 112* 101*  BUN 21 13  CREATININE 0.87 0.96  CALCIUM 10.1 9.9    CBC: Recent Labs  Lab 02/17/21 0528  WBC 3.4*  NEUTROABS 2.2  HGB 9.2*  HCT 27.9*  MCV 93.0  PLT 247    CBG: No results for input(s): GLUCAP in the last 168 hours.  Family history:  Mother: CVA, CAD, stroke Father: CAD, AMI Maternal grandmother: stomach  cancer  Brief HPI:   Todd Chapman is a 75 y.o. male who initially presented to the emergency department with acute onset of mid lumbar back pain on 02/06/2021.  A new inferior endplate compression deformity involving L5 vertebral body was noted.  No cauda equina syndrome noted.  Family medicine was contacted for admission.  Neurosurgery was consulted. He underwent L5 kyphoplasty with vertebral body biopsies by Dr. Arnoldo Morale on February 10, 2021.  Because of his persistent low back pain, orthopedic surgery was consulted.  MRI of the pelvis was performed and revealed bilateral acetabular/supra acetabular stress fractures in the pelvis.  These findings were not felt to be clinically significant or would preclude progression to weightbearing.  Metabolic bone disease work-up in progress. He will need inpatient rehab therapies for decreased functional mobility due to acute on chronic back pain from compression and stress fractures.     Hospital Course: JERIAH CORKUM was admitted to rehab 02/12/2021 for inpatient therapies to consist of PT, ST and OT at least three hours five days a week. Past admission physiatrist, therapy team and rehab RN have worked together to provide customized collaborative inpatient rehab. Pain was monitored and oxycodone dose adjusted. Hypersensitivity to morphine characterized by confusion. Flexeril caused oversedation therefore discontinued and started on Robaxin. POD 3 kyphoplasty with normal LE strength. Biopsy negative for malignancy. Mild hyponatremia resolved without fluid restriction. Elevated diastolic BP monitored. Likely secondary to pain syndrome. No antihypertensives required. Prostate cancer stable. Vitamin D level normal.  Mild, asymptomatic  anemia. No transfusion required. He was tolerating his diet and having bowel movements. Incision healing without signs of infection. Discussed cancer related fatigue and started on N-acetyl cysteine. Complained of productive cough  without fever. Chest x-ray performed with minimal bibasilar atelectasis. Flutter valve/IS provided. Overall improvement in pain with current regimen.  Blood pressures were monitored on TID basis and he had elevated diastolic BP readings into the 90s-100s. This was likely secondary to pain and was monitored. He did not require medication.  Rehab course: During patient's stay in rehab weekly team conferences were held to monitor patient's progress, set goals and discuss barriers to discharge. At admission, patient required min with mobility secondary to muscle weakness, decreased cardiorespiratoy endurance, decreased problem solving and decreased safety awareness, and decreased sitting balance, decreased standing balance, decreased postural control, decreased balance strategies, and difficulty maintaining precautions.   Discharge physical exam: BP: 168/87 , pulse 66, temp 98.1, RR 16, SpO2 100% RA Consitutional: no apparent distress HEENT: conjugate gaze; oropharynx moist CV: regular rate; no JVD Psychiatric: Appropriate, interactive Neurologic: Alert and oriented x 4, cranial nerves II through XII intact. Skin: Incision healing without signs of infection Musculoskeletal: 5/5 strength bilaterally, sensation intact   He has had improvement in activity tolerance, balance, postural control as well as ability to compensate for deficits. He has had improvement in functional ability, balance as well as improvement in awareness. OT intervention with focus on functional amb with RW and dynamic standing balance to increase independence with BADLs. Pt amb with RW from room to ADL apartment and sat in standard chair to rest before continuing on to ortho gym. BITS activities on compliant and noncompliant surface-1x2 min and 1x1 min on compliant surface. No LOB with CGA.   Disposition: Discharged home   Diet: Heart healthy  Special Instructions: No driving, alcohol consumption or tobacco use  30-35  minutes spent completing discharge summary and discharge planning and instructions. Discharge Instructions     Ambulatory referral to Physical Medicine Rehab   Complete by: As directed    Hospital follow-up   Discharge patient   Complete by: As directed    Discharge disposition: 01-Home or Self Care   Discharge patient date: 02/21/2021      Allergies as of 02/21/2021       Reactions   Atorvastatin Palpitations   Ezetimibe Palpitations   Morphine And Related Other (See Comments)   Pt has hypersensitivity to morphine- makes confused after prolonged usage  Medication List     STOP taking these medications    Oxycodone HCl 10 MG Tabs Replaced by: oxyCODONE 15 mg 12 hr tablet       TAKE these medications    acetaminophen 325 MG tablet Commonly known as: TYLENOL Take 2 tablets (650 mg total) by mouth every 6 (six) hours. What changed:  when to take this reasons to take this   calcitonin (salmon) 200 UNIT/ACT nasal spray Commonly known as: MIACALCIN/FORTICAL Place 1 spray into alternate nostrils daily.   calcium carbonate 500 MG chewable tablet Commonly known as: TUMS - dosed in mg elemental calcium Chew 2 tablets by mouth daily as needed for indigestion or heartburn.   Eliquis 5 MG Tabs tablet Generic drug: apixaban TAKE 1 TABLET(5 MG) BY MOUTH TWICE DAILY   furosemide 20 MG tablet Commonly known as: LASIX Take 20 mg by mouth daily.   multivitamin with minerals Tabs tablet Take 1 tablet by mouth daily.   oxyCODONE 15 mg 12 hr tablet Commonly known as: OXYCONTIN Take 1 tablet (15 mg total) by mouth every 12 (twelve) hours. Replaces: Oxycodone HCl 10 MG Tabs   oxyCODONE 5 MG immediate release tablet Commonly known as: Roxicodone Take 1 tablet (5 mg total) by mouth every 3 (three) hours as needed.   pantoprazole 40 MG tablet Commonly known as: PROTONIX Take 40 mg daily by mouth.   polyethylene glycol 17 g packet Commonly known as: MIRALAX /  GLYCOLAX Take 17 g by mouth daily.   rosuvastatin 10 MG tablet Commonly known as: CRESTOR Take 10 mg by mouth every morning.   senna 8.6 MG Tabs tablet Commonly known as: SENOKOT Take 1 tablet (8.6 mg total) by mouth daily. Start taking on: February 22, 2021   sodium chloride 0.65 % Soln nasal spray Commonly known as: OCEAN Place 1 spray into both nostrils daily as needed for congestion.   SYSTANE BALANCE OP Place 1 drop into both eyes daily.   tamsulosin 0.4 MG Caps capsule Commonly known as: FLOMAX TAKE 1 CAPSULE(0.4 MG) BY MOUTH DAILY AFTER AND SUPPER   trolamine salicylate 10 % cream Commonly known as: ASPERCREME Apply 1 application topically daily.        Follow-up Information     Newman Pies, MD. Call in 3 week(s).   Specialty: Neurosurgery Why: s/p kyphoplasty Contact information: [address] [place] [address] [place] [address] [phone]         Altamese Osino, MD. Call in 4 week(s).   Specialty: Orthopedic Surgery Why: s/p hospitalization Contact information: [place] [address] [phone]         Courtney Heys, MD Follow up.   Specialty: Physical Medicine and Rehabilitation Why: office will call you to arrange your appt (sent) Contact information: [address] [place] [address] [phone]                 Signed: Barbie Banner 02/21/2021, 11:05 AM

## 2021-02-19 NOTE — Progress Notes (Signed)
Patient ID: Todd Chapman, male   DOB: [birth year], 75 y.o.   MRN: [id number]  SW faxed outpatient PT order at [facility] [place] (G:[phone]/M:[phone]).  Loralee Pacas, MSW, [place] Office: [phone] Cell: [phone] Fax: [phone]

## 2021-02-19 NOTE — Progress Notes (Signed)
Occupational Therapy Session Note  Patient Details  Name: Todd Chapman MRN: [id number] Date of Birth: [birth year]  Today's Date: 02/19/2021 OT Individual Time: 1115-1200 OT Individual Time Calculation (min): 45 min    Short Term Goals: Week 1:  OT Short Term Goal 1 (Week 1): STG=LTG  Skilled Therapeutic Interventions/Progress Updates:    Pt resting in w/c upon arrival. OT intervention with focus on functional amb with RW and dynamic standing balance to increase independence with BADLs. Pt amb with RW from room to ADL apartment and sat in standard chair to rest before continuing on to ortho gym. BITS activities on compliant and noncompliant surface-1x2 min and 1x1 min on compliant surface. No LOB with CGA. Pt returned to main gym and completed standing task at rebounder: compliant surface and noncompliant surace 10 tosses X 3. No LOB and CGA. Pt returned to room and amb into bathroom. Standing at toilet to urinate with supervision. Pt returned to room and sat in recliner. All needs within reach and seat alarm activated.   Therapy Documentation Precautions:  Precautions Precautions: Back, Fall Precaution Booklet Issued: Yes (comment) Restrictions Weight Bearing Restrictions: No Other Position/Activity Restrictions: Per Ortho consult, WBAT bil hips   Pain: Pt reports 4/10 low back/hip pain; activity and rest   Therapy/Group: Individual Therapy  Leroy Libman 02/19/2021, 12:06 PM

## 2021-02-20 DIAGNOSIS — I4821 Permanent atrial fibrillation: Secondary | ICD-10-CM

## 2021-02-20 DIAGNOSIS — I10 Essential (primary) hypertension: Secondary | ICD-10-CM | POA: Diagnosis not present

## 2021-02-20 DIAGNOSIS — S32050G Wedge compression fracture of fifth lumbar vertebra, subsequent encounter for fracture with delayed healing: Secondary | ICD-10-CM | POA: Diagnosis not present

## 2021-02-20 NOTE — Progress Notes (Signed)
Occupational Therapy Discharge Summary  Patient Details  Name: Todd Chapman MRN: [id number] Date of Birth: 11/24/1945  Patient has met 8 of 8 long term goals due to improved activity tolerance, improved balance, postural control, ability to compensate for deficits, functional use of  RIGHT lower and LEFT lower extremity, and improved coordination.  Pt made excellent progress with BADLs and functional transfers. Pt is mod I for all BADLs, including toileting. Pt is mod I with RW for all functional transfers. Pt's wife has participated in education sessions and provides the appropriate level of supervision. Patient to discharge at overall Modified Independent level.  Patient's care partner is independent to provide the necessary physical assistance at discharge.     Recommendation:  No f/u at this time  Equipment: No equipment provided  Reasons for discharge: treatment goals met and discharge from hospital  Patient/family agrees with progress made and goals achieved: Yes  OT Discharge Vision Baseline Vision/History: 0 No visual deficits Patient Visual Report: No change from baseline Vision Assessment?: No apparent visual deficits Perception  Perception: Within Functional Limits Praxis Praxis: Intact Cognition Overall Cognitive Status: Within Functional Limits for tasks assessed Arousal/Alertness: Awake/alert Orientation Level: Oriented X4 Year: 2022 Month: December Day of Week: Correct Attention: Selective Selective Attention: Appears intact Memory: Appears intact Immediate Memory Recall: Sock;Blue;Bed Memory Recall Sock: Without Cue Memory Recall Blue: Without Cue Memory Recall Bed: Without Cue Awareness: Appears intact Problem Solving: Appears intact Safety/Judgment: Appears intact Sensation Sensation Light Touch: Appears Intact Hot/Cold: Appears Intact Proprioception: Appears Intact Stereognosis: Appears Intact Coordination Gross Motor Movements are Fluid and  Coordinated: Yes Fine Motor Movements are Fluid and Coordinated: Yes Finger Nose Finger Test: [facility] Motor  Motor Motor: Abnormal postural alignment and control Motor - Discharge Observations: mild weakness noted, improved from eval    Trunk/Postural Assessment  Cervical Assessment Cervical Assessment: Within Functional Limits Thoracic Assessment Thoracic Assessment:  (rounded shoulders) Lumbar Assessment Lumbar Assessment:  (posterior pelvic tilt)  Balance Static Sitting Balance Static Sitting - Level of Assistance: 7: Independent Dynamic Sitting Balance Dynamic Sitting - Level of Assistance: 6: Modified independent (Device/Increase time) Extremity/Trunk Assessment RUE Assessment RUE Assessment: Within Functional Limits LUE Assessment LUE Assessment: Within Functional Limits   Leroy Libman 02/20/2021, 3:06 PM

## 2021-02-20 NOTE — Progress Notes (Addendum)
Occupational Therapy Session Note  Patient Details  Name: Todd Chapman MRN: [id number] Date of Birth: December 15, 1945  Today's Date: 02/20/2021 OT Individual Time: 1300-1345 OT Individual Time Calculation (min): 45 min    Short Term Goals: Week 1:  OT Short Term Goal 1 (Week 1): STG=LTG  Skilled Therapeutic Interventions/Progress Updates:    OT intervention with focus on functional amb with RW and standing balance in preparation for discharge home tomorrow. Pt amb with RW to ortho gym and engaged in standing activities at [employer] without BUE support of RW. No LOB. Pt returned to room and sat in recliner. Pt pleased with progress and prepared for discharge home tomorrow.   Therapy Documentation Precautions:  Precautions Precautions: Back, Fall Precaution Booklet Issued: Yes (comment) Restrictions Weight Bearing Restrictions: No Other Position/Activity Restrictions: Per Ortho consult, WBAT bil hips Pain: Pain Assessment Pain Scale: 0-10 Pain Score: 3 ; rest    Therapy/Group: Individual Therapy  Leroy Libman 02/20/2021, 3:18 PM

## 2021-02-20 NOTE — Progress Notes (Signed)
Occupational Therapy Session Note  Patient Details  Name: Todd Chapman MRN: [id number] Date of Birth: [birth year]  Today's Date: 02/20/2021 OT Individual Time: 1017-1057 OT Individual Time Calculation (min): 40 min    Short Term Goals: Week 1:  OT Short Term Goal 1 (Week 1): STG=LTG  Skilled Therapeutic Interventions/Progress Updates: Pt greeted  seated in recliner  agreeable to OT intervention. Session focus on education related to pain mgmt strategies. Education and handouts provided on holistic pain mgmt such as deep breathing, progressive muscle relaxation, imagery, and challenging irrational thoughts. Discussed using heat vs ice depending on type of pain such as tightness vs aching. Discussed use of aromatherapy for pain mgmt such as lavender for pain mgmt. Dicussed making sure pt stays on top of pain meds and sticks to pain medication regimen as determined by physician. Additionally talked about light stretches for relaxation such as shoulder rolls etc.  Pt appreciative of education and pt left  seated in recliner with all needs within reach.                      Therapy Documentation Precautions:  Precautions Precautions: Back, Fall Precaution Booklet Issued: Yes (comment) Restrictions Weight Bearing Restrictions: No Other Position/Activity Restrictions: Per Ortho consult, WBAT bil hips Pain: Unrated general  pain reported, extensive education provided on pain mgmt strategies as indicated above.    Therapy/Group: Individual Therapy  Precious Haws 02/20/2021, 12:13 PM

## 2021-02-20 NOTE — Progress Notes (Signed)
Occupational Therapy Session Note  Patient Details  Name: LASON EVELAND MRN: [id number] Date of Birth: February 29, 1946  Today's Date: 02/20/2021 OT Individual Time: 7062-3762 OT Individual Time Calculation (min): 75 min    Short Term Goals: Week 1:  OT Short Term Goal 1 (Week 1): STG=LTG  Skilled Therapeutic Interventions/Progress Updates:    Pt resting in bed upon arrival and ready to start the day. OT intervention with focus on BADLs, functional amb with RW, standing balance, safety awareness, and ongoing discharge planning. Pt amb in room with RW to bathroom to use toilet prior to transfer to shower for bathing. Pt gathered supplies and prepared for shower. Pt completed bathing and dressing with at mod I. No unsafe behaviors or LOB noted. Reviewed home safety recommendations. Pt remained seated in recliner with all needs within reach. Pt pleased with progress and ready for discharge home tomorrow.  Therapy Documentation Precautions:  Precautions Precautions: Back, Fall Precaution Booklet Issued: Yes (comment) Restrictions Weight Bearing Restrictions: No Other Position/Activity Restrictions: Per Ortho consult, WBAT bil hips Pain: Pt c/o 7/10 back pain; stretching and rest/repositioning  Therapy/Group: Individual Therapy  Leroy Libman 02/20/2021, 9:39 AM

## 2021-02-20 NOTE — Progress Notes (Signed)
PROGRESS NOTE   Subjective/Complaints:  Up in shower. Feeling well. Excited about going home tomorrow and how well he's doing  ROS: Patient denies fever, rash, sore throat, blurred vision, nausea, vomiting, diarrhea, cough, shortness of breath or chest pain,  headache, or mood change.     Objective:   No results found. No results for input(s): WBC, HGB, HCT, PLT in the last 72 hours.  No results for input(s): NA, K, CL, CO2, GLUCOSE, BUN, CREATININE, CALCIUM in the last 72 hours.   Intake/Output Summary (Last 24 hours) at 02/20/2021 1000 Last data filed at 02/20/2021 0835 Gross per 24 hour  Intake 954 ml  Output 1050 ml  Net -96 ml        Physical Exam: Vital Signs Blood pressure (!) 142/98, pulse 64, temperature 97.9 F (36.6 C), resp. rate 19, height 6' 2.02" (1.88 m), weight 93.1 kg, SpO2 99 %.   Constitutional: No distress . Vital signs reviewed. HEENT: NCAT, EOMI, oral membranes moist Neck: supple Cardiovascular: RRR without murmur. No JVD    Respiratory/Chest: CTA Bilaterally without wheezes or rales. Normal effort    GI/Abdomen: BS +, non-tender, non-distended Ext: no clubbing, cyanosis, or edema Psych: pleasant and cooperative  Neurological: Ox3 Very sharp this AM Skin: incision  with steristrips in place Neuro: CN 2-12 intact. Alert and oriented x4.   5/5 strength, sensation intact.     Assessment/Plan: 1. Functional deficits which require 3+ hours per day of interdisciplinary therapy in a comprehensive inpatient rehab setting. Physiatrist is providing close team supervision and 24 hour management of active medical problems listed below. Physiatrist and rehab team continue to assess barriers to discharge/monitor patient progress toward functional and medical goals  Care Tool:  Bathing    Body parts bathed by patient: Right arm, Left arm, Chest, Abdomen, Front perineal area, Buttocks, Right  upper leg, Left upper leg, Right lower leg, Left lower leg, Face         Bathing assist Assist Level: Independent with assistive device     Upper Body Dressing/Undressing Upper body dressing   What is the patient wearing?: Pull over shirt, Orthosis    Upper body assist Assist Level: Independent with assistive device    Lower Body Dressing/Undressing Lower body dressing      What is the patient wearing?: Underwear/pull up, Pants     Lower body assist Assist for lower body dressing: Independent with assitive device     Toileting Toileting    Toileting assist Assist for toileting: Independent with assistive device     Transfers Chair/bed transfer  Transfers assist     Chair/bed transfer assist level: Supervision/Verbal cueing Chair/bed transfer assistive device: Programmer, multimedia   Ambulation assist      Assist level: Supervision/Verbal cueing Assistive device: Walker-rolling Max distance: 200'   Walk 10 feet activity   Assist     Assist level: Supervision/Verbal cueing Assistive device: Walker-rolling   Walk 50 feet activity   Assist Walk 50 feet with 2 turns activity did not occur: Safety/medical concerns  Assist level: Supervision/Verbal cueing Assistive device: Walker-rolling    Walk 150 feet activity   Assist Walk 150 feet activity  did not occur: Safety/medical concerns  Assist level: Supervision/Verbal cueing Assistive device: Walker-rolling    Walk 10 feet on uneven surface  activity   Assist Walk 10 feet on uneven surfaces activity did not occur: Safety/medical concerns         Wheelchair     Assist Is the patient using a wheelchair?: No             Wheelchair 50 feet with 2 turns activity    Assist            Wheelchair 150 feet activity     Assist          Blood pressure (!) 142/98, pulse 64, temperature 97.9 F (36.6 C), resp. rate 19, height 6' 2.02" (1.88 m), weight 93.1  kg, SpO2 99 %.  Medical Problem List and Plan: 1. 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             -patient may shower but incision must be covered             -ELOS/Goals: 10-14 days S  Dc home 12/16. Will get 7 days of pain meds- then call clinic for refill of 30 days.  2.  Antithrombotics: -DVT/anticoagulation:  Pharmaceutical: Other (comment) Eliquis             -antiplatelet therapy: none 3. Pain Management: oxycodone IR 10 mg every 4 hours; Tylenol 650 mg every 6 hours; Flexeril 10 mg TID PRN  12/9- changed scheduled oxycodone 10 mg to Oxycontin 15 mg BID and con't prn Oxy q3 hours prn. Pain doing better- con't new regimen  12/16- changed Flexeril to Robaxin 500 mg q6 hours prn  4. Mood: LCSW to provide support             -antipsychotic agents: n/a 5. Neuropsych: This patient is capable of making decisions on his own behalf. 6. Skin/Wound Care: incision care:dry dressing and monitor; routine skin checks 7. Fluids/Electrolytes/Nutrition: routine ins and outs with follow-up chemistries 8. Persistent atrial fibrillation: rate controlled; continue apixaban  12/15- Afib stable- rate controlle-d con't regimen 9. Dyslipidemia: continue Crestor 10 mg daily, heart healthy diet 10: History of prostate cancer; no evidence currently of metastases; completed radiation therapy and hormonal therapy [facility]). L5 biopsy with no evidence of metastasis. Continue calcitonin, Flomax. Check vitamin D level tomorrow.  12/9- Vit D level ok  at 39.3 11: mild normocytic, normochromic anemia: monitor CBC, repeat tomorrow 12 Cancer-related fatigue: discussed with him over the counter N-acetyl-cysteine 600mg  BID versus Modafinil and he prefers to start with the former- he will ask his wife to purchase and advised him to give medication to nurse for [place] to place patient may use own med order. 13. HTN-  DBP higher than expected  112/15- DBP 98, has been generally controlled except for a few readings,no changes today 14. Hyponatremia  12/9- Na 130 down from 131- will recheck Saturday and if continues to drop, will place on fluid restrictions.   12/10- Na 130- will recheck weekly on qmonday.   12/12- Na up to 133 without fluid restrictions 15. Phlegm-y cough  12/12- will check CXR/ PA and lateral- shows minimal atelectasis- will order flutter valve and ask nursing to teach how to use. Reassure that lungs doing ok  12/13- explained needs to use ICS or flutter valve q2 hours while awake-   12/15 appears improved   LOS: 8 days  A FACE TO FACE EVALUATION WAS PERFORMED  Meredith Staggers 02/20/2021, 10:00 AM

## 2021-02-20 NOTE — Progress Notes (Signed)
Physical Therapy Session Note  Patient Details  Name: [name] MRN: [id number] Date of Birth: Nov 13, 1945  Today's Date: 02/20/2021 PT Individual Time: 4401-0272 PT Individual Time Calculation (min): 70 min   Short Term Goals: Week 1:  PT Short Term Goal 1 (Week 1): Patient will recall 3/3 back precautions with no additional cuing PT Short Term Goal 2 (Week 1): Patient will complete bed <> wc transfer with LRAD and CGA PT Short Term Goal 3 (Week 1): Patient will ambulate >23ft with LRAD and CGA PT Short Term Goal 4 (Week 1): Patient will initiate stair training  Skilled Therapeutic Interventions/Progress Updates: Pt presented in recliner agreeable to therapy. Pt states pain 3/10, no intervention requested, but rest breaks provided as needed. Pt indicated had been instructed pre-surgery some gentle stretches (pelvic tilts, LTR within small range, and hamstring stretches) and inquiring about whether could perform. PTA reviewed and instructed on maintaining small range to maintain spinal precautions. Pt then ambulated to ADL apt and demonstrated furniture transfer and bed mobility on standard bed which pt was able to perform with mod I. Pt then performed ambulation on compliant/uneven surface with close supervision and picked up cup off ground with use of reacher. Pt was able to demonstrate good insight to not bend to pick up object without cues. Pt then participated in LE therex as review from HEP as noted below. Pt then ambulated back to room mod I and discussed pt being made mod I in room with pt verbalizing understanding. Pt left in room at end of session with call bell within reach and needs met.      Therapy Documentation Precautions:  Precautions Precautions: Back, Fall Precaution Booklet Issued: Yes (comment) Restrictions Weight Bearing Restrictions: No Other Position/Activity Restrictions: Per Ortho consult, WBAT bil hips General:   Vital Signs: Therapy Vitals Temp: 98 F  (36.7 C) Temp Source: Oral Pulse Rate: 74 Resp: 16 BP: 120/73 Patient Position (if appropriate): Sitting Oxygen Therapy SpO2: 98 % O2 Device: Room Air Pain: Pain Assessment Pain Scale: 0-10 Pain Score: 3  Mobility:   Locomotion :    Trunk/Postural Assessment : Cervical Assessment Cervical Assessment: Within Functional Limits Thoracic Assessment Thoracic Assessment:  (rounded shoulders) Lumbar Assessment Lumbar Assessment:  (posterior pelvic tilt)  Balance: Static Sitting Balance Static Sitting - Level of Assistance: 7: Independent Dynamic Sitting Balance Dynamic Sitting - Level of Assistance: 6: Modified independent (Device/Increase time) Exercises:   Other Treatments:      Therapy/Group: Individual Therapy  [name] 02/20/2021, 4:22 PM

## 2021-02-21 ENCOUNTER — Other Ambulatory Visit: Payer: Self-pay | Admitting: Physician Assistant

## 2021-02-21 DIAGNOSIS — E871 Hypo-osmolality and hyponatremia: Secondary | ICD-10-CM

## 2021-02-21 DIAGNOSIS — I4819 Other persistent atrial fibrillation: Secondary | ICD-10-CM

## 2021-02-21 MED ORDER — CALCITONIN (SALMON) 200 UNIT/ACT NA SOLN: 1.0000 | Freq: Every day | NASAL | 0 refills | Status: DC

## 2021-02-21 MED ORDER — OXYCODONE HCL 5 MG PO TABS: 5.0000 mg | ORAL_TABLET | ORAL | 0 refills | Status: DC | PRN

## 2021-02-21 MED ORDER — ACETAMINOPHEN 325 MG PO TABS: 650.0000 mg | ORAL_TABLET | Freq: Four times a day (QID) | ORAL | Status: AC

## 2021-02-21 MED ORDER — POLYETHYLENE GLYCOL 3350 17 G PO PACK: 17.0000 g | PACK | Freq: Every day | ORAL | 0 refills | Status: DC

## 2021-02-21 MED ORDER — SENNA 8.6 MG PO TABS: 1.0000 | ORAL_TABLET | Freq: Every day | ORAL | 0 refills | Status: DC

## 2021-02-21 NOTE — Progress Notes (Signed)
Inpatient Rehabilitation Discharge Medication Review by a Pharmacist  A complete drug regimen review was completed for this patient to identify any potential clinically significant medication issues.  High Risk Drug Classes Is patient taking? Indication by Medication  Antipsychotic No   Anticoagulant Yes Eliquis for CVA prevention with afib.  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    Other       Clinically significant medication issues were identified that warrant physician communication and completion of prescribed/recommended actions by midnight of the next day:  No  Time spent performing this drug regimen review (minutes):  3min  Bernedette Auston S. Alford Highland, PharmD, BCPS Clinical Staff Pharmacist [email or web address] [place] 02/21/2021 9:53 AM

## 2021-02-21 NOTE — Progress Notes (Signed)
PROGRESS NOTE   Subjective/Complaints: Pt up in bed. Ready to go home. Had questions about follow up, meds. Pleased with progress!  ROS: Patient denies fever, rash, sore throat, blurred vision, nausea, vomiting, diarrhea, cough, shortness of breath or chest pain,  headache, or mood change.     Objective:   No results found. No results for input(s): WBC, HGB, HCT, PLT in the last 72 hours.  No results for input(s): NA, K, CL, CO2, GLUCOSE, BUN, CREATININE, CALCIUM in the last 72 hours.   Intake/Output Summary (Last 24 hours) at 02/21/2021 0926 Last data filed at 02/21/2021 0600 Gross per 24 hour  Intake 480 ml  Output 1150 ml  Net -670 ml        Physical Exam: Vital Signs Blood pressure (!) 162/87, pulse 66, temperature 98.1 F (36.7 C), resp. rate 16, height 6' 2.02" (1.88 m), weight 93.1 kg, SpO2 100 %.   Constitutional: No distress . Vital signs reviewed. HEENT: NCAT, EOMI, oral membranes moist Neck: supple Cardiovascular: RRR without murmur. No JVD    Respiratory/Chest: CTA Bilaterally without wheezes or rales. Normal effort    GI/Abdomen: BS +, non-tender, non-distended Ext: no clubbing, cyanosis, or edema Psych: pleasant and cooperative  Neurological: Ox3 Very sharp this AM Skin: incision cdi  with steristrips in place Neuro: CN 2-12 intact. Alert and oriented x4.   5/5 strength, sensation intact.     Assessment/Plan: 1. Functional deficits which require 3+ hours per day of interdisciplinary therapy in a comprehensive inpatient rehab setting. Physiatrist is providing close team supervision and 24 hour management of active medical problems listed below. Physiatrist and rehab team continue to assess barriers to discharge/monitor patient progress toward functional and medical goals  Care Tool:  Bathing    Body parts bathed by patient: Right arm, Left arm, Chest, Abdomen, Front perineal area,  Buttocks, Right upper leg, Left upper leg, Right lower leg, Left lower leg, Face         Bathing assist Assist Level: Independent with assistive device     Upper Body Dressing/Undressing Upper body dressing   What is the patient wearing?: Pull over shirt, Orthosis    Upper body assist Assist Level: Independent with assistive device    Lower Body Dressing/Undressing Lower body dressing      What is the patient wearing?: Underwear/pull up, Pants     Lower body assist Assist for lower body dressing: Independent with assitive device     Toileting Toileting    Toileting assist Assist for toileting: Independent with assistive device     Transfers Chair/bed transfer  Transfers assist     Chair/bed transfer assist level: Independent with assistive device Chair/bed transfer assistive device: Museum/gallery exhibitions officer assist      Assist level: Supervision/Verbal cueing Assistive device: Walker-rolling Max distance: 366ft   Walk 10 feet activity   Assist     Assist level: Independent with assistive device Assistive device: Walker-rolling   Walk 50 feet activity   Assist Walk 50 feet with 2 turns activity did not occur: Safety/medical concerns  Assist level: Independent with assistive device Assistive device: Walker-rolling    Walk 150 feet  activity   Assist Walk 150 feet activity did not occur: Safety/medical concerns  Assist level: Supervision/Verbal cueing Assistive device: Walker-rolling    Walk 10 feet on uneven surface  activity   Assist Walk 10 feet on uneven surfaces activity did not occur: Safety/medical concerns   Assist level: Supervision/Verbal cueing Assistive device: Walker-rolling   Wheelchair     Assist Is the patient using a wheelchair?: No             Wheelchair 50 feet with 2 turns activity    Assist            Wheelchair 150 feet activity     Assist          Blood  pressure (!) 162/87, pulse 66, temperature 98.1 F (36.7 C), resp. rate 16, height 6' 2.02" (1.88 m), weight 93.1 kg, SpO2 100 %.  Medical Problem List and Plan: 1. 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             -dc home today - Will get 7 days of pain meds- then call clinic for refill of 30 days.  2.  Antithrombotics: -DVT/anticoagulation:  Pharmaceutical: Other (comment) Eliquis             -antiplatelet therapy: none 3. Pain Management: oxycodone IR 10 mg every 4 hours; Tylenol 650 mg every 6 hours; Flexeril 10 mg TID PRN  12/9- changed scheduled oxycodone 10 mg to Oxycontin 15 mg BID and con't prn Oxy q3 hours prn. Pain doing better- con't new regimen  -Robaxin 500 mg q6 hours prn  4. Mood: LCSW to provide support             -antipsychotic agents: n/a 5. Neuropsych: This patient is capable of making decisions on his own behalf. 6. Skin/Wound Care: incision care:dry dressing and monitor; routine skin checks 7. Fluids/Electrolytes/Nutrition: routine ins and outs with follow-up chemistries 8. Persistent atrial fibrillation: rate controlled; continue apixaban  12/16- Afib stable- rate controlle-d con't regimen 9. Dyslipidemia: continue Crestor 10 mg daily, heart healthy diet 10: History of prostate cancer; no evidence currently of metastases; completed radiation therapy and hormonal therapy [facility]). L5 biopsy with no evidence of metastasis. Continue calcitonin, Flomax. Check vitamin D level tomorrow.  12/9- Vit D level ok  at 39.3 11: mild normocytic, normochromic anemia: monitor CBC, repeat tomorrow 12 Cancer-related fatigue: discussed with him over the counter N-acetyl-cysteine 600mg  BID versus Modafinil and he prefers to start with the former- he will ask his wife to purchase and advised him to give medication to nurse for [place] to place patient may use own med order. 13.  HTN- DBP higher than expected  12/16- DBP sl elevated again.   -address further as outpt and follow trends 14. Hyponatremia  12/9- Na 130 down from 131- will recheck Saturday and if continues to drop, will place on fluid restrictions.   12/10- Na 130- will recheck weekly on qmonday.   12/12- Na up to 133 without fluid restrictions 15. Phlegm-y cough  12/16 resolved  LOS: 9 days A FACE TO FACE EVALUATION WAS PERFORMED  Meredith Staggers 02/21/2021, 9:26 AM

## 2021-02-21 NOTE — Progress Notes (Signed)
Patient discharged this shift via wheelchair by staff. Voices understanding of discharge instructions

## 2021-02-21 NOTE — Progress Notes (Signed)
Inpatient Rehabilitation Care Coordinator Discharge Note   Patient Details  Name: Todd Chapman MRN: [id number] Date of Birth: 1945-06-22   Discharge location: D/c to home with support from his wife  Length of Stay: 8 days  Discharge activity level: Mod I with RW  Home/community participation: Limited  Patient response YY:TKPTWS Literacy - How often do you need to have someone help you when you read instructions, pamphlets, or other written material from your doctor or pharmacy?: Never  Patient response FK:CLEXNT Isolation - How often do you feel lonely or isolated from those around you?: Never  Services provided included: MD, RD, PT, OT, RN, CM, TR, Pharmacy, Neuropsych, SW  Financial Services:  Financial Services Utilized: [employer] BCBS Supplement  Choices offered to/list presented to: Yes  Follow-up services arranged:  Outpatient, DME    Outpatient Servicies: [facility] for outpatient PT DME : [place] for RW    Patient response to transportation need: Is the patient able to respond to transportation needs?: Yes In the past 12 months, has lack of transportation kept you from medical appointments or from getting medications?: No In the past 12 months, has lack of transportation kept you from meetings, work, or from getting things needed for daily living?: No   Comments (or additional information):  Patient/Family verbalized understanding of follow-up arrangements:  Yes  Individual responsible for coordination of the follow-up plan: contact pt#[phone] or pt wife Todd Chapman#[phone]  Confirmed correct DME delivered: Todd Chapman 02/21/2021    Todd Chapman

## 2021-02-23 LAB — TESTOSTERONE, % FREE: Testosterone-% Free: 0.5 % (ref 0.2–0.7)

## 2021-02-24 ENCOUNTER — Encounter: Payer: Self-pay | Admitting: Physical Medicine and Rehabilitation

## 2021-02-26 ENCOUNTER — Other Ambulatory Visit: Payer: Self-pay

## 2021-02-26 MED ORDER — OXYCODONE HCL ER 15 MG PO T12A: 15.0000 mg | EXTENDED_RELEASE_TABLET | Freq: Two times a day (BID) | ORAL | 0 refills | Status: DC

## 2021-02-26 MED ORDER — OXYCODONE HCL 5 MG PO TABS: 5.0000 mg | ORAL_TABLET | ORAL | 0 refills | Status: DC | PRN

## 2021-02-26 NOTE — Telephone Encounter (Signed)
PMP report: Filled  Written  ID  Drug  QTY  Days  Prescriber  RX #  Dispenser  Refill  Daily Dose*  Pymt Type  PMP  02/21/2021 02/21/2021 1  Oxycodone Hcl (Ir) 5 Mg Tablet 56.00 10 Sa Set [id number] [facility] (0300) 0/0 42.00 MME Medicare [place] 02/20/2021 02/19/2021 1  Oxycontin Er 15 Mg Tablet 14.00 7 Sa Set [id number] [facility] (6226) 0/0 45.00 MME Medicare [place] 01/23/2021 01/23/2021 1  Oxycodone-Acetaminophen 5-325 60.00 30 Ro Gat [id number] [facility] (2563) 0/0 15.00 MME Medicare [facility] discharged on 02/13/2020. Next appointment here 05/02/2021.

## 2021-03-04 DIAGNOSIS — M84350D Stress fracture, pelvis, subsequent encounter for fracture with routine healing: Secondary | ICD-10-CM | POA: Diagnosis not present

## 2021-03-04 DIAGNOSIS — M7061 Trochanteric bursitis, right hip: Secondary | ICD-10-CM | POA: Diagnosis not present

## 2021-03-04 DIAGNOSIS — M7062 Trochanteric bursitis, left hip: Secondary | ICD-10-CM | POA: Diagnosis not present

## 2021-03-06 NOTE — Therapy (Addendum)
OUTPATIENT PHYSICAL THERAPY THORACOLUMBAR EVALUATION   Patient Name: Todd Chapman MRN: [id number] DOB:1945/05/18, 75 y.o., male Today's Date: 03/07/2021   PT End of Session - 03/07/21 1009     Visit Number 1    Number of Visits 18    Date for PT Re-Evaluation 05/30/21    Authorization Type Medicare A&B    Progress Note Due on Visit 10    PT Start Time 0851    PT Stop Time 0941    PT Time Calculation (min) 50 min    Activity Tolerance Patient tolerated treatment well    Behavior During Therapy [facility] for tasks assessed/performed             Past Medical History:  Diagnosis Date   Arthritis    "thumbs, back" (02/16/2017)   Bradycardia 02/2017   Dyspnea    with exertion   Flu 2 weeks ago   GERD (gastroesophageal reflux disease)    Hepatitis A as child   HLD (hyperlipidemia)    Persistent atrial fibrillation ([employer]) 12/18/2016   Pneumonia 1999; 2018   Presence of permanent cardiac pacemaker    st jude   Prostate cancer [facility])    Past Surgical History:  Procedure Laterality Date   APPENDECTOMY     age 48   CARDIOVERSION  02/16/2017   CARDIOVERSION N/A 02/16/2017   Procedure: CARDIOVERSION;  Surgeon: [place] Grayer, MD;  Location: [place] [facility];  Service: Cardiovascular;  Laterality: N/A;   COLONOSCOPY WITH PROPOFOL N/A 12/13/2012   Procedure: COLONOSCOPY WITH PROPOFOL;  Surgeon: Garlan Fair, MD;  Location: [facility];  Service: Endoscopy;  Laterality: N/A;   FRACTURE SURGERY     HYDROCELE EXCISION Left 05/16/2018   Procedure: HYDROCELECTOMY ADULT;  Surgeon: [name], MD;  Location: [facility];  Service: Urology;  Laterality: Left;   INSERT / REPLACE / REMOVE PACEMAKER  02/16/2017   KYPHOPLASTY N/A 02/10/2021   Procedure: KYPHOPLASTY L-5;  Surgeon: Newman Pies, MD;  Location: [place];  Service: Neurosurgery;  Laterality: N/A;   LAPAROSCOPY N/A 06/04/2020   Procedure: POSSIBLE LAPAROTOMY POSSIBLE BOWEL RESSECTION;  Surgeon: Clovis Riley, MD;  Location: [facility];  Service: General;  Laterality: N/A;   PACEMAKER IMPLANT N/A 02/16/2017   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 by Dr Rayann Heman   PROSTATE BIOPSY  11/23/2019   WRIST FRACTURE SURGERY Left    with pins   Patient Active Problem List   Diagnosis Date Noted   Vertebral compression fracture ([place]) 02/12/2021   Bilateral acetabular fractures, closed, initial encounter ([place]) 02/08/2021   Closed compression fracture of L5 lumbar vertebra, initial encounter ([place]) 02/08/2021   Back pain 02/06/2021   SBO (small bowel obstruction) ([place]) 06/01/2020   Pacemaker 05/13/2020   Malignant neoplasm of prostate ([place]) 01/09/2020   Annual physical exam 01/08/2020   Acute upper respiratory infection 01/08/2020   Asymptomatic varicose veins of lower extremity 01/08/2020   Bronchitis 01/08/2020   Cataract 01/08/2020   Constipation 01/08/2020   Diverticulosis of large intestine without perforation or abscess without bleeding 01/08/2020   Screening for gout 01/08/2020   False positive serological test for syphilis 01/08/2020   Gastro-esophageal reflux disease without esophagitis 01/08/2020   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   History of colonic polyps 01/08/2020   Impacted cerumen 01/08/2020   Insomnia 01/08/2020   Osteoarthritis 01/08/2020   Polypharmacy 01/08/2020   Pure hypercholesterolemia 01/08/2020   Rosacea 01/08/2020   Combined forms of age-related  cataract of both eyes 07/13/2019   Elevated blood pressure reading 11/08/2018   Sick sinus syndrome ([place]) 02/16/2017   Persistent atrial fibrillation ([place]) 12/18/2016   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   Nonexudative age-related macular degeneration, bilateral, 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   Dermatochalasis of both upper eyelids 07/25/2014   Nuclear sclerosis of both eyes 07/25/2014   Bradycardia 05/29/2014   Premature atrial contractions 05/29/2014    Premature junctional contractions ([place]) 05/29/2014   Hyperlipidemia 05/29/2014   Encounter for general adult medical examination with abnormal findings 04/24/2014   Vitamin D deficiency 04/24/2014    PCP: [name], MD  REFERRING PROVIDER: Barbie Banner, PA-C   REFERRING DIAG: [phone] (ICD-10-CM) - Collapsed vertebra, not elsewhere classified, site unspecified, initial encounter for fracture   THERAPY DIAG:  Chronic low back pain without sciatica, unspecified back pain laterality  Muscle weakness (generalized)  Difficulty in walking, not elsewhere classified  ONSET DATE: Bilateral L5 kyphoplasty and vertebral body biopsies on 02/10/2021  SUBJECTIVE:                                                                                                                                                                                           SUBJECTIVE STATEMENT: -Pt began having back pain in late 2021 with no specific MOI though did coincide with dx of prostate CA.  Pt had Rx for prostate CA in Jan and Feb of 2022.  Pt continued to have lumbar pain though had an exacerbation in October for no specific reason.  He went to the ED in November due to pain.  Pt had a MRI on 02/07/2021 which showed Grade 1 anterolisthesis at L4-L5 and L5 inferior endplate compression fracture with less than 50% loss of height.    -Pt underwent Bilateral L5 kyphoplasty and vertebral body biopsies by Dr. Earle Gell on 02/10/2021.  Pt states the biopsies came back negative for CA.  Pt was discharged from hospital on 02/21/2021 and used the walker for 5 more days. Pt had PT in the hospital.   -Pt has increased pain and is limited with standing duration and ambulation distance.  Pt has difficulty and pain with bed mobility.  Pt is typically able to stand in the shower but does have to sit occasionally. Pt is limited with preparing food and cooking.  Pt walks with and without walker and uses his brace most of the day.   He is unable to workout in the gym   -Pt saw Dr. Lyla Glassing on 03/04/2021 and gave him an order for PT for Pubic Rami stress fractures.  Eval and treat bilt pubic rami  stress fx and troch bursitis.      PERTINENT HISTORY:  02/10/2021 bilat L5 kyphoplasty,  bilat acetabular stress fractures and pubic rami stress Fx; Grade 1 anterolisthesis at L4-L5; Pacemaker, A-fib, hx of prostate CA   PAIN:  Are you having pain? Yes VAS scale: 4/10 current, 8/10 worst, 4/10 best Pain location: R sided LBP and posterior hip pain. Aggravating factors: standing Relieving factors: meds  PRECAUTIONS: Back--No bending, lifting > 10# , twisting  WEIGHT BEARING RESTRICTIONS No  FALLS:  Has patient fallen in last 6 months? No  LIVING ENVIRONMENT: Lives with: lives with their family and lives with their spouse Lives in: 3 story home Stairs: Yes Has following equipment at home: Single point cane and Environmental consultant - 2 wheeled  OCCUPATION: English as a second language teacher.  Not performing work activities  PLOF: Independent  PATIENT GOALS to return to the gym; improve functional mobility   OBJECTIVE:   DIAGNOSTIC FINDINGS: (In Epic) -MRI on 02/07/2021 (prior to surgery) which showed Grade 1 anterolisthesis at L4-L5 and L5 inferior endplate compression fracture with less than 50% loss of height. -Earlier MRI showed Moderate osteoarthritis of bilateral hips and bilat acetabular stress/insufficiency fractures.  MRI indicated differential considerations include severe stress reaction possible developing insufficiency fracture versus underlying malignancy secondary to metastatic disease given the patient's history of prostate cancer. -MRI pelvis: 1. Bilateral acetabular/supra-acetabular stress fractures in the pelvis. There is likewise some stress fracture involving the left medial superior pubic ramus. This overall represents a progression compared to the previous unilateral left acetabular stress fracture shown on  11/04/2020.  PATIENT SURVEYS:  FOTO 34 with a goal of 46   COGNITION:  Overall cognitive status: Within functional limits for tasks assessed     SENSATION:  Light touch: Appears intact; 2+ sensation to LT t/o bilat LE dermatomes  OBSERVATION:  Small incisions are intact without any signs of infection. Dry and healing well.  Pt not wearing brace today.  POSTURE:  Pt feels better sitting in chair with back supported.    LUMBAR AROM AND LE STRENGTH:  Not Tested due to recent surgery    FUNCTIONAL TESTS:  Pt able to perform sit to stand transfers independently with usage of hands.  Pt is limited with standing duration.   GAIT: Comments: Pt entered clinic ambulating with walker folded up and pushing it on one side.  He ambulates with an antalgic limp without walker.   Pt demonstrates improved gait without limping when using FWW.  Pt required no assistance from PT with ambulation.     TODAY'S TREATMENT  Educated pt on neutral spine and demonstrated neutral spine and good posture to pt.   See Pt education for further Rx.   PATIENT EDUCATION:  Education details: Educated pt on back precautions including to avoid twisting, lifting, and bending.   Educated pt on the importance of neutral spine.  Educated pt on Surgical procedure, post op precautions, dx, POC, and relevant anatomy.  Person educated: Patient Education method: Explanation, demonstration Education comprehension: verbalized understanding and returned demonstration   HOME EXERCISE PROGRAM: Pt has a HEP from hospital.  ASSESSMENT:  CLINICAL IMPRESSION: Patient is a 75 y.o. male 3 weeks and 4 days s/p Bilateral L5 kyphoplasty and vertebral body biopsies presenting to the clinic with lumbar and hip pain, muscle weakness, difficulty in walking, and limited mobility.  Pt reports the biopsies came back negative for CA.  Pt also known to have bilat acetabular stress fractures.  He saw Dr. Lyla Glassing and was given  an order  for PT for Pubic Rami stress fractures.  Pt is limited with ambulation distance and walks some with and some without walker.  He uses his back brace most of the day but did not wear it today.  Pt is very limited with standing duration and is limited with IADLs and household chores.  He is unable to workout in the gym.  Patient should benefit from skilled PT to address above impairments and improve overall function.   . Objective impairments include Abnormal gait, decreased activity tolerance, decreased balance, decreased endurance, decreased mobility, difficulty walking, decreased ROM, decreased strength, hypomobility, impaired flexibility, postural dysfunction, and pain. These impairments are limiting patient from cleaning, community activity, meal prep, occupation, laundry, shopping, and ambulation . Personal factors including 3+ comorbidities:  bilat acetabular stress fractures and pubic rami stress Fx; Pacemaker, A-fib, and hx of prostate CA   are also affecting patient's functional outcome.    REHAB POTENTIAL: Good  CLINICAL DECISION MAKING: Evolving/moderate complexity  EVALUATION COMPLEXITY: Moderate   GOALS:   SHORT TERM GOALS:  STG Name Target Date Goal status  1 Pt will be independent and compliant with HEP for improved pain, strength, and mobility.  Baseline:  03/28/2021 INITIAL  2 Pt will demo improved limp with gait without AD Baseline:  04/04/2021 INITIAL  3 Pt will progress with walking program without adverse effects.  Baseline: 04/18/2021 INITIAL  4 Pt will be able to perform bed mobility without increased pain or difficulty.  Baseline: 04/11/2021 INITIAL  5 Pt will perform core exercises without adverse effects for improved core strength in order to perform daily activities and functional mobility with reduced stress on lumbar, increased ease, and reduced pain Baseline: 04/04/2021 INITIAL             LONG TERM GOALS:   LTG Name Target Date Goal status  1 Pt will be able  to perform occupational activities without significant pain or difficulty. Baseline: 05/30/2021 INITIAL  2 Pt will be able to perform extended community ambulation without an AD without increased pain or difficulty.  Baseline: 05/30/2021 INITIAL  3 Pt will ambulate without an AD without limping.  Baseline: 05/16/2021 INITIAL  4 Pt will be able to perform IADLs and household chores including his normal standing activities such as preparing food and cooking without significant pain or limitation.  Baseline: 05/30/2021 INITIAL    PLAN: PT FREQUENCY:  1 time per week currently and will transition to 2x/wk when needed  PT DURATION: other: 10-12 weeks  PLANNED INTERVENTIONS: Therapeutic exercises, Therapeutic activity, Neuro Muscular re-education, Balance training, Gait training, Patient/Family education, Stair training, DME instructions, Aquatic Therapy, Cryotherapy, Taping, and Manual therapy  PLAN FOR NEXT SESSION: NO E-STIM--Pt has a PACEMAKER.  Review his home exercise program from hospital. Teach and perform log rolling. Cont per kyphoplasty protocol with regards to stress fractures.    Selinda Michaels III PT, DPT 03/07/21 3:43 PM

## 2021-03-07 ENCOUNTER — Encounter (HOSPITAL_BASED_OUTPATIENT_CLINIC_OR_DEPARTMENT_OTHER): Payer: Self-pay | Admitting: Physical Therapy

## 2021-03-07 ENCOUNTER — Other Ambulatory Visit: Payer: Self-pay

## 2021-03-07 ENCOUNTER — Ambulatory Visit (HOSPITAL_BASED_OUTPATIENT_CLINIC_OR_DEPARTMENT_OTHER): Payer: Medicare Other | Attending: Physician Assistant | Admitting: Physical Therapy

## 2021-03-07 DIAGNOSIS — R262 Difficulty in walking, not elsewhere classified: Secondary | ICD-10-CM

## 2021-03-07 DIAGNOSIS — M6281 Muscle weakness (generalized): Secondary | ICD-10-CM | POA: Diagnosis not present

## 2021-03-07 DIAGNOSIS — G8929 Other chronic pain: Secondary | ICD-10-CM | POA: Insufficient documentation

## 2021-03-07 DIAGNOSIS — M545 Low back pain, unspecified: Secondary | ICD-10-CM

## 2021-03-24 ENCOUNTER — Encounter (HOSPITAL_BASED_OUTPATIENT_CLINIC_OR_DEPARTMENT_OTHER): Payer: Self-pay | Admitting: Physical Therapy

## 2021-03-24 ENCOUNTER — Ambulatory Visit (HOSPITAL_BASED_OUTPATIENT_CLINIC_OR_DEPARTMENT_OTHER): Payer: Medicare Other | Attending: Physician Assistant | Admitting: Physical Therapy

## 2021-03-24 ENCOUNTER — Other Ambulatory Visit: Payer: Self-pay

## 2021-03-24 DIAGNOSIS — R262 Difficulty in walking, not elsewhere classified: Secondary | ICD-10-CM | POA: Insufficient documentation

## 2021-03-24 DIAGNOSIS — M6281 Muscle weakness (generalized): Secondary | ICD-10-CM | POA: Insufficient documentation

## 2021-03-24 DIAGNOSIS — M545 Low back pain, unspecified: Secondary | ICD-10-CM | POA: Insufficient documentation

## 2021-03-24 DIAGNOSIS — G8929 Other chronic pain: Secondary | ICD-10-CM | POA: Diagnosis not present

## 2021-03-24 NOTE — Therapy (Signed)
OUTPATIENT PHYSICAL THERAPY TREATMENT NOTE   Patient Name: Todd Chapman MRN: [id number] DOB:[birth year], 76 y.o., male Today's Date: 03/24/2021  PCP: [name], MD    PT End of Session - 03/24/21 1433     Visit Number 2    Number of Visits 18    Date for PT Re-Evaluation 05/30/21    Authorization Type Medicare A&B    Progress Note Due on Visit 10    PT Start Time 1116    PT Stop Time 1201    PT Time Calculation (min) 45 min    Activity Tolerance Patient tolerated treatment well    Behavior During Therapy WFL for tasks assessed/performed             Past Medical History:  Diagnosis Date   Arthritis    "thumbs, back" (02/16/2017)   Bradycardia 02/2017   Dyspnea    with exertion   Flu 2 weeks ago   GERD (gastroesophageal reflux disease)    Hepatitis A as child   HLD (hyperlipidemia)    Persistent atrial fibrillation ([place]) 12/18/2016   Pneumonia 1999; 2018   Presence of permanent cardiac pacemaker    st jude   Prostate cancer [facility])    Past Surgical History:  Procedure Laterality Date   APPENDECTOMY     age 58   CARDIOVERSION  02/16/2017   CARDIOVERSION N/A 02/16/2017   Procedure: CARDIOVERSION;  Surgeon: Thompson Grayer, MD;  Location: [place] [facility];  Service: Cardiovascular;  Laterality: N/A;   COLONOSCOPY WITH PROPOFOL N/A 12/13/2012   Procedure: COLONOSCOPY WITH PROPOFOL;  Surgeon: Garlan Fair, MD;  Location: [facility];  Service: Endoscopy;  Laterality: N/A;   FRACTURE SURGERY     HYDROCELE EXCISION Left 05/16/2018   Procedure: HYDROCELECTOMY ADULT;  Surgeon: [name], MD;  Location: [facility];  Service: Urology;  Laterality: Left;   INSERT / REPLACE / REMOVE PACEMAKER  02/16/2017   KYPHOPLASTY N/A 02/10/2021   Procedure: KYPHOPLASTY L-5;  Surgeon: Newman Pies, MD;  Location: [place];  Service: Neurosurgery;  Laterality: N/A;   LAPAROSCOPY N/A 06/04/2020   Procedure: POSSIBLE LAPAROTOMY POSSIBLE BOWEL RESSECTION;   Surgeon: Clovis Riley, MD;  Location: [facility];  Service: General;  Laterality: N/A;   PACEMAKER IMPLANT N/A 02/16/2017   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 by Dr Rayann Heman   PROSTATE BIOPSY  11/23/2019   WRIST FRACTURE SURGERY Left    with pins   Patient Active Problem List   Diagnosis Date Noted   Vertebral compression fracture ([place]) 02/12/2021   Bilateral acetabular fractures, closed, initial encounter ([place]) 02/08/2021   Closed compression fracture of L5 lumbar vertebra, initial encounter ([place]) 02/08/2021   Back pain 02/06/2021   SBO (small bowel obstruction) ([place]) 06/01/2020   Pacemaker 05/13/2020   Malignant neoplasm of prostate ([place]) 01/09/2020   Annual physical exam 01/08/2020   Acute upper respiratory infection 01/08/2020   Asymptomatic varicose veins of lower extremity 01/08/2020   Bronchitis 01/08/2020   Cataract 01/08/2020   Constipation 01/08/2020   Diverticulosis of large intestine without perforation or abscess without bleeding 01/08/2020   Screening for gout 01/08/2020   False positive serological test for syphilis 01/08/2020   Gastro-esophageal reflux disease without esophagitis 01/08/2020   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   History of colonic polyps 01/08/2020   Impacted cerumen 01/08/2020   Insomnia 01/08/2020   Osteoarthritis 01/08/2020   Polypharmacy 01/08/2020   Pure hypercholesterolemia 01/08/2020   Rosacea 01/08/2020  Combined forms of age-related cataract of both eyes 07/13/2019   Elevated blood pressure reading 11/08/2018   Sick sinus syndrome ([place]) 02/16/2017   Persistent atrial fibrillation ([place]) 12/18/2016   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   Nonexudative age-related macular degeneration, bilateral, 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   Dermatochalasis of both upper eyelids 07/25/2014   Nuclear sclerosis of both eyes 07/25/2014   Bradycardia 05/29/2014   Premature atrial contractions  05/29/2014   Premature junctional contractions ([place]) 05/29/2014   Hyperlipidemia 05/29/2014   Encounter for general adult medical examination with abnormal findings 04/24/2014   Vitamin D deficiency 04/24/2014    REFERRING PROVIDER: Barbie Banner, PA-C    REFERRING DIAG: [phone] (ICD-10-CM) - Collapsed vertebra, not elsewhere classified, site unspecified, initial encounter for fracture    THERAPY DIAG:  Chronic low back pain without sciatica, unspecified back pain laterality   Muscle weakness (generalized)   Difficulty in walking, not elsewhere classified   ONSET DATE: Bilateral L5 kyphoplasty and vertebral body biopsies on 02/10/2021   SUBJECTIVE:                                                                                                                                                                                            SUBJECTIVE STATEMENT: -Pt began having back pain in late 2021 with no specific MOI though did coincide with dx of prostate CA.  Pt had Rx for prostate CA in Jan and Feb of 2022.   Pt underwent Bilateral L5 kyphoplasty and vertebral body biopsies by Dr. Earle Gell on 02/10/2021.  Pt states the biopsies came back negative for CA.  Pt was discharged from hospital on 02/21/2021 and used the walker for 5 more days. Pt had PT in the hospital.   -Pt saw Dr. Lyla Glassing on 03/04/2021 and gave him an order for PT for Pubic Rami stress fractures.  Eval and treat bilt pubic rami stress fx and troch bursitis.     -Pt is 6 weeks post op.  Pt denies any adverse effects after prior Rx.  Pt states he is unstable with gait but can use external supports with ambulating in his home.  Pt states he has a balance issue.  Pt doesn't use the walker inside his home.  Pt ambulated into facility without walker today but had his wife for support.  Pt uses his brace most of the day.  Pt reports improved ambulation since prior visit.  He is limited with ambulation distance.  Pt's standing  duration is limited though is improving.  Pt has been performing resisted exercises including LAQ with 5#, shoulder press with 10#, lateral  raises with 10#, tricep kickbacks with weight, sidestepping with band, and squats.  Pt's wife present during Rx and states he has swelling in [employer].   PERTINENT HISTORY:  02/10/2021 bilat L5 kyphoplasty,  bilat acetabular stress fractures and pubic rami stress Fx; Grade 1 anterolisthesis at L4-L5; Pacemaker, A-fib, hx of prostate CA    PAIN:  Are you having pain? Yes NPRS scale: 6/10 current, 8/10 worst, 4/10 best Pain location:  Central lumbar and R sided LBP  Aggravating factors: standing Relieving factors: meds   PRECAUTIONS: Back--No bending, lifting > 10# , twisting   WEIGHT BEARING RESTRICTIONS No     OCCUPATION: Arbitrator and mediator.  Not performing work activities   PLOF: Independent   PATIENT GOALS to return to the gym; improve functional mobility     OBJECTIVE:    DIAGNOSTIC FINDINGS: (In Epic) -MRI on 02/07/2021 (prior to surgery) which showed Grade 1 anterolisthesis at L4-L5 and L5 inferior endplate compression fracture with less than 50% loss of height. -Earlier MRI showed Moderate osteoarthritis of bilateral hips and bilat acetabular stress/insufficiency fractures.  MRI indicated differential considerations include severe stress reaction possible developing insufficiency fracture versus underlying malignancy secondary to metastatic disease given the patient's history of prostate cancer. -MRI pelvis: 1. Bilateral acetabular/supra-acetabular stress fractures in the pelvis. There is likewise some stress fracture involving the left medial superior pubic ramus. This overall represents a progression compared to the previous unilateral left acetabular stress fracture shown on 11/04/2020.       TODAY'S TREATMENT  Therapeutic Exercise:   -Reviewed current function, pain level, response to prior Rx, exercise program. -PT spent  Extensive time educating on what activities and exercises to avoids and the rationale.  PT answered Pt's questions.  See Pt education below for details. -Reviewed HEP from hospital.  Pt received a HEP handout and was educated in correct form and appropriate frequency. -Pt performed  Educated pt in correct performance and palpation of TrA contraction.  Pt performed TrA contractions in supine with 5sec hold.   Supine marching with TrA 2x10 reps  Supine heel slides with Tra 2x10 reps  Kinetic activities: -PT educated pt in correct form and positioning of log rolling.  Pt performed log rolling in the clinic with supine/sit transfers.          PATIENT EDUCATION:  Education details:   Answered Pt's questions.  Instructed pt to not perform UE resistive exercises until being cleared by MD to perform.  Instructed pt to not perform seated shoulder press with weight and rationale.  Educated pt on back precautions including to avoid twisting, lifting, and bending.           Educated pt on the importance of neutral spine.  Log rolling, post op precautions, dx, squatting form, POC, and relevant anatomy.  Used a spinal model to discuss dx and relevant anatomy.  Pt received a HEP handout and was educated in correct form and appropriate frequency.  PT instructed pt should not have increased pain and should stop exercises if he does have increased pain.   Person educated: Patient and wife Education method: Explanation, demonstration, verbal cues, tactile cues, and handout Education comprehension: verbalized understanding and returned demonstration, verbal cues and tactile cues required     HOME EXERCISE PROGRAM: Access Code: [email or web address] URL: [email or web address] Date: 03/24/2021 Prepared by: Ronny Flurry  Exercises Supine Transversus Abdominis Bracing - Hands on Stomach - 2-3 x daily - 7 x weekly - 1 sets - 10 reps - 5 seconds  hold Supine March - 1-2 x daily - 7 x weekly - 2 sets - 10  reps Supine Core Control with Heel Slide - 1-2 x daily - 7 x weekly - 1-2 sets - 10 reps     ASSESSMENT:   CLINICAL IMPRESSION: Pt entered clinic without walker and is wearing lumbar brace.  Pt is increasing ambulation distance and does still use the walker.  PT spent extensive time with pt education and answering questions today including appropriate vs inappropriate exercises at home.  PT educated pt with core exercises in supine and gave pt a handout.  Pt performed exercises well in the clinic with cuing and instruction in correct form.  Pt reports he could feel some soreness in his core mm.  He responded well to Rx reporting minimally increased pain after Rx.   Patient should benefit from skilled PT to address goals and impairments and improve overall function.        Objective impairments include Abnormal gait, decreased activity tolerance, decreased balance, decreased endurance, decreased mobility, difficulty walking, decreased ROM, decreased strength, hypomobility, impaired flexibility, postural dysfunction, and pain. These impairments are limiting patient from cleaning, community activity, meal prep, occupation, laundry, shopping, and ambulation . Personal factors including 3+ comorbidities:  bilat acetabular stress fractures and pubic rami stress Fx; Pacemaker, A-fib, and hx of prostate CA   are also affecting patient's functional outcome.      REHAB POTENTIAL: Good   CLINICAL DECISION MAKING: Evolving/moderate complexity   EVALUATION COMPLEXITY: Moderate     GOALS:     SHORT TERM GOALS:   STG Name Target Date Goal status  1 Pt will be independent and compliant with HEP for improved pain, strength, and mobility.  Baseline:  03/28/2021 INITIAL  2 Pt will demo improved limp with gait without AD Baseline:  04/04/2021 INITIAL  3 Pt will progress with walking program without adverse effects.  Baseline: 04/18/2021 INITIAL  4 Pt will be able to perform bed mobility without increased  pain or difficulty.  Baseline: 04/11/2021 INITIAL  5 Pt will perform core exercises without adverse effects for improved core strength in order to perform daily activities and functional mobility with reduced stress on lumbar, increased ease, and reduced pain Baseline: 04/04/2021 INITIAL                      LONG TERM GOALS:    LTG Name Target Date Goal status  1 Pt will be able to perform occupational activities without significant pain or difficulty. Baseline: 05/30/2021 INITIAL  2 Pt will be able to perform extended community ambulation without an AD without increased pain or difficulty.  Baseline: 05/30/2021 INITIAL  3 Pt will ambulate without an AD without limping.  Baseline: 05/16/2021 INITIAL  4 Pt will be able to perform IADLs and household chores including his normal standing activities such as preparing food and cooking without significant pain or limitation.  Baseline: 05/30/2021 INITIAL      PLAN: PT FREQUENCY:  1 time per week currently and will transition to 2x/wk when needed   PT DURATION: other: 10-12 weeks   PLANNED INTERVENTIONS: Therapeutic exercises, Therapeutic activity, Neuro Muscular re-education, Balance training, Gait training, Patient/Family education, Stair training, DME instructions, Aquatic Therapy, Cryotherapy, Taping, and Manual therapy   PLAN FOR NEXT SESSION: NO E-STIM--Pt has a PACEMAKER.  Review and perform HEP.  Progress core exercises per kyphoplasty protocol.  Perform DLS on airex pad next visit.  Cont per kyphoplasty protocol with regards  to stress fractures.       Selinda Michaels III PT, DPT 03/24/21 5:31 PM

## 2021-03-26 DIAGNOSIS — C61 Malignant neoplasm of prostate: Secondary | ICD-10-CM | POA: Diagnosis not present

## 2021-03-31 ENCOUNTER — Other Ambulatory Visit: Payer: Self-pay | Admitting: Physician Assistant

## 2021-03-31 ENCOUNTER — Encounter (HOSPITAL_BASED_OUTPATIENT_CLINIC_OR_DEPARTMENT_OTHER): Payer: Medicare Other | Admitting: Physical Therapy

## 2021-04-02 ENCOUNTER — Ambulatory Visit (HOSPITAL_BASED_OUTPATIENT_CLINIC_OR_DEPARTMENT_OTHER): Payer: Medicare Other | Admitting: Physical Therapy

## 2021-04-02 ENCOUNTER — Encounter (HOSPITAL_BASED_OUTPATIENT_CLINIC_OR_DEPARTMENT_OTHER): Payer: Self-pay | Admitting: Physical Therapy

## 2021-04-02 ENCOUNTER — Other Ambulatory Visit: Payer: Self-pay

## 2021-04-02 DIAGNOSIS — G8929 Other chronic pain: Secondary | ICD-10-CM | POA: Diagnosis not present

## 2021-04-02 DIAGNOSIS — M545 Low back pain, unspecified: Secondary | ICD-10-CM

## 2021-04-02 DIAGNOSIS — R262 Difficulty in walking, not elsewhere classified: Secondary | ICD-10-CM

## 2021-04-02 DIAGNOSIS — M6281 Muscle weakness (generalized): Secondary | ICD-10-CM

## 2021-04-02 NOTE — Therapy (Signed)
OUTPATIENT PHYSICAL THERAPY TREATMENT NOTE   Patient Name: Todd Chapman MRN: [id number] DOB:[birth year], 76 y.o., male Today's Date: 04/02/2021  PCP: [name], MD      Past Medical History:  Diagnosis Date   Arthritis    "thumbs, back" (02/16/2017)   Bradycardia 02/2017   Dyspnea    with exertion   Flu 2 weeks ago   GERD (gastroesophageal reflux disease)    Hepatitis A as child   HLD (hyperlipidemia)    Persistent atrial fibrillation ([place]) 12/18/2016   Pneumonia 1999; 2018   Presence of permanent cardiac pacemaker    st jude   Prostate cancer [facility])    Past Surgical History:  Procedure Laterality Date   APPENDECTOMY     age 1   CARDIOVERSION  02/16/2017   CARDIOVERSION N/A 02/16/2017   Procedure: CARDIOVERSION;  Surgeon: Thompson Grayer, MD;  Location: [facility];  Service: Cardiovascular;  Laterality: N/A;   COLONOSCOPY WITH PROPOFOL N/A 12/13/2012   Procedure: COLONOSCOPY WITH PROPOFOL;  Surgeon: Garlan Fair, MD;  Location: [facility];  Service: Endoscopy;  Laterality: N/A;   FRACTURE SURGERY     HYDROCELE EXCISION Left 05/16/2018   Procedure: HYDROCELECTOMY ADULT;  Surgeon: [name], MD;  Location: [facility];  Service: Urology;  Laterality: Left;   INSERT / REPLACE / REMOVE PACEMAKER  02/16/2017   KYPHOPLASTY N/A 02/10/2021   Procedure: KYPHOPLASTY L-5;  Surgeon: Newman Pies, MD;  Location: [facility];  Service: Neurosurgery;  Laterality: N/A;   LAPAROSCOPY N/A 06/04/2020   Procedure: POSSIBLE LAPAROTOMY POSSIBLE BOWEL RESSECTION;  Surgeon: Clovis Riley, MD;  Location: [facility];  Service: General;  Laterality: N/A;   PACEMAKER IMPLANT N/A 02/16/2017   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 by Dr Rayann Heman   PROSTATE BIOPSY  11/23/2019   WRIST FRACTURE SURGERY Left    with pins   Patient Active Problem List   Diagnosis Date Noted   Vertebral compression  fracture ([place]) 02/12/2021   Bilateral acetabular fractures, closed, initial encounter ([place]) 02/08/2021   Closed compression fracture of L5 lumbar vertebra, initial encounter ([place]) 02/08/2021   Back pain 02/06/2021   SBO (small bowel obstruction) ([place]) 06/01/2020   Pacemaker 05/13/2020   Malignant neoplasm of prostate ([place]) 01/09/2020   Annual physical exam 01/08/2020   Acute upper respiratory infection 01/08/2020   Asymptomatic varicose veins of lower extremity 01/08/2020   Bronchitis 01/08/2020   Cataract 01/08/2020   Constipation 01/08/2020   Diverticulosis of large intestine without perforation or abscess without bleeding 01/08/2020   Screening for gout 01/08/2020   False positive serological test for syphilis 01/08/2020   Gastro-esophageal reflux disease without esophagitis 01/08/2020   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   History of colonic polyps 01/08/2020   Impacted cerumen 01/08/2020   Insomnia 01/08/2020   Osteoarthritis 01/08/2020   Polypharmacy 01/08/2020   Pure hypercholesterolemia 01/08/2020   Rosacea 01/08/2020   Combined forms of age-related cataract of both eyes 07/13/2019   Elevated blood pressure reading 11/08/2018   Sick sinus syndrome ([place]) 02/16/2017   Persistent atrial fibrillation ([place]) 12/18/2016   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   Nonexudative age-related macular degeneration, bilateral, 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   Dermatochalasis of both upper eyelids 07/25/2014   Nuclear sclerosis of both eyes 07/25/2014   Bradycardia 05/29/2014   Premature atrial contractions 05/29/2014   Premature junctional contractions ([place]) 05/29/2014   Hyperlipidemia 05/29/2014   Encounter for general adult medical examination  with abnormal findings 04/24/2014   Vitamin D deficiency 04/24/2014    REFERRING PROVIDER: Barbie Banner, PA-C    REFERRING DIAG: [phone] (ICD-10-CM) - Collapsed vertebra, not elsewhere classified, site unspecified, initial encounter for  fracture    THERAPY DIAG:  Chronic low back pain without sciatica, unspecified back pain laterality   Muscle weakness (generalized)   Difficulty in walking, not elsewhere classified   ONSET DATE: Bilateral L5 kyphoplasty and vertebral body biopsies on 02/10/2021   SUBJECTIVE:                                                                                                                                                                                            SUBJECTIVE STATEMENT: -Pt began having back pain in late 2021 with no specific MOI though did coincide with dx of prostate CA.  Pt had Rx for prostate CA in Jan and Feb of 2022.   Pt underwent Bilateral L5 kyphoplasty and vertebral body biopsies by Dr. Earle Gell on 02/10/2021.  Pt states the biopsies came back negative for CA.  Pt was discharged from hospital on 02/21/2021 and used the walker for 5 more days. Pt had PT in the hospital.   -Pt saw Dr. Lyla Glassing on 03/04/2021 and gave him an order for PT for Pubic Rami stress fractures.  Eval and treat bilt pubic rami stress fx and troch bursitis.     -Pt is 6 weeks post op.  Pt denies any adverse effects after prior Rx.  Pt states he is unstable with gait but can use external supports with ambulating in his home.  Pt states he has a balance issue.  Pt doesn't use the walker inside his home.  Pt ambulated into facility without walker today but had his wife for support.  Pt uses his brace most of the day.  Pt reports improved ambulation since prior visit.  He is limited with ambulation distance.  Pt's standing duration is limited though is improving.  Pt has been performing resisted exercises including LAQ with 5#, shoulder press with 10#, lateral raises with 10#, tricep kickbacks with weight, sidestepping with band, and squats.  Pt's wife present during Rx and states he has swelling in [place].  1/25:  Patient reports he has been doing well with his home exercises. He is having some pain in  his right gluteal. He reports the pain has been about the same. He doesn't feel like the exercises increase it.    PERTINENT HISTORY:  02/10/2021 bilat L5 kyphoplasty,  bilat acetabular stress fractures and pubic rami stress Fx; Grade 1 anterolisthesis at L4-L5; Pacemaker, A-fib, hx of  prostate CA    PAIN:  Are you having pain? Yes 1/25 NPRS scale: 6/10 current, 8/10 worst, 4/10 best Pain location:  Central lumbar and R sided LBP  Aggravating factors: standing Relieving factors: meds   PRECAUTIONS: Back--No bending, lifting > 10# , twisting   WEIGHT BEARING RESTRICTIONS No     OCCUPATION: Arbitrator and mediator.  Not performing work activities   PLOF: Independent   PATIENT GOALS to return to the gym; improve functional mobility     OBJECTIVE:    DIAGNOSTIC FINDINGS: (In Epic) -MRI on 02/07/2021 (prior to surgery) which showed Grade 1 anterolisthesis at L4-L5 and L5 inferior endplate compression fracture with less than 50% loss of height. -Earlier MRI showed Moderate osteoarthritis of bilateral hips and bilat acetabular stress/insufficiency fractures.  MRI indicated differential considerations include severe stress reaction possible developing insufficiency fracture versus underlying malignancy secondary to metastatic disease given the patient's history of prostate cancer. -MRI pelvis: 1. Bilateral acetabular/supra-acetabular stress fractures in the pelvis. There is likewise some stress fracture involving the left medial superior pubic ramus. This overall represents a progression compared to the previous unilateral left acetabular stress fracture shown on 11/04/2020.       TODAY'S TREATMENT  1/25 Supine marching with TrA 2x10 reps also reviewed switch progression  Supine PPT with TRA 2x15   LTR x15 each side 5 second hold in low range  Gluteal stretch 3x20 sec hold   Standing shoulder extension 2x10 red also given green band for home  Standing scap retraction 2x10 red band  also given green   Reviewed self soft tissue mobilization with tennis ball        1/16  Therapeutic Exercise:   -Reviewed current function, pain level, response to prior Rx, exercise program. -PT spent Extensive time educating on what activities and exercises to avoids and the rationale.  PT answered Pt's questions.  See Pt education below for details. -Reviewed HEP from hospital.  Pt received a HEP handout and was educated in correct form and appropriate frequency. -Pt performed  Educated pt in correct performance and palpation of TrA contraction.  Pt performed TrA contractions in supine with 5sec hold.   Supine marching with TrA 2x10 reps  Supine heel slides with Tra 2x10 reps  Kinetic activities: -PT educated pt in correct form and positioning of log rolling.  Pt performed log rolling in the clinic with supine/sit transfers.          PATIENT EDUCATION:  Education details:   Answered Pt's questions.  Instructed pt to not perform UE resistive exercises until being cleared by MD to perform.  Instructed pt to not perform seated shoulder press with weight and rationale.  Educated pt on back precautions including to avoid twisting, lifting, and bending.           Educated pt on the importance of neutral spine.  Log rolling, post op precautions, dx, squatting form, POC, and relevant anatomy.  Used a spinal model to discuss dx and relevant anatomy.  Pt received a HEP handout and was educated in correct form and appropriate frequency.  PT instructed pt should not have increased pain and should stop exercises if he does have increased pain.   Person educated: Patient and wife Education method: Explanation, demonstration, verbal cues, tactile cues, and handout Education comprehension: verbalized understanding and returned demonstration, verbal cues and tactile cues required     HOME EXERCISE PROGRAM: Access Code: [email or web address] URL: [email or web address] Date:  03/24/2021 Prepared by: Ronny Flurry  Exercises  Supine Transversus Abdominis Bracing - Hands on Stomach - 2-3 x daily - 7 x weekly - 1 sets - 10 reps - 5 seconds hold Supine March - 1-2 x daily - 7 x weekly - 2 sets - 10 reps Supine Core Control with Heel Slide - 1-2 x daily - 7 x weekly - 1-2 sets - 10 reps     ASSESSMENT:   CLINICAL IMPRESSION: Patient tolerated treatment well. He has been doing a light rotation stretch at home. Therapy reviewed the technique. He was advised to move in motions that he has. Therapy also reviewed a gluteal stretch with the patient. He reported it felt good. Therapy gave him standing core strengthening exercises. He is anxious to get back in the gym, but he was advised we will work with him to progress him in a safe manor back to gym activity.    Objective impairments include Abnormal gait, decreased activity tolerance, decreased balance, decreased endurance, decreased mobility, difficulty walking, decreased ROM, decreased strength, hypomobility, impaired flexibility, postural dysfunction, and pain. These impairments are limiting patient from cleaning, community activity, meal prep, occupation, laundry, shopping, and ambulation . Personal factors including 3+ comorbidities:  bilat acetabular stress fractures and pubic rami stress Fx; Pacemaker, A-fib, and hx of prostate CA   are also affecting patient's functional outcome.      REHAB POTENTIAL: Good   CLINICAL DECISION MAKING: Evolving/moderate complexity   EVALUATION COMPLEXITY: Moderate     GOALS:     SHORT TERM GOALS:   STG Name Target Date Goal status  1 Pt will be independent and compliant with HEP for improved pain, strength, and mobility.  Baseline:  03/28/2021 INITIAL  2 Pt will demo improved limp with gait without AD Baseline:  04/04/2021 INITIAL  3 Pt will progress with walking program without adverse effects.  Baseline: 04/18/2021 INITIAL  4 Pt will be able to perform bed mobility without  increased pain or difficulty.  Baseline: 04/11/2021 INITIAL  5 Pt will perform core exercises without adverse effects for improved core strength in order to perform daily activities and functional mobility with reduced stress on lumbar, increased ease, and reduced pain Baseline: 04/04/2021 INITIAL                      LONG TERM GOALS:    LTG Name Target Date Goal status  1 Pt will be able to perform occupational activities without significant pain or difficulty. Baseline: 05/30/2021 INITIAL  2 Pt will be able to perform extended community ambulation without an AD without increased pain or difficulty.  Baseline: 05/30/2021 INITIAL  3 Pt will ambulate without an AD without limping.  Baseline: 05/16/2021 INITIAL  4 Pt will be able to perform IADLs and household chores including his normal standing activities such as preparing food and cooking without significant pain or limitation.  Baseline: 05/30/2021 INITIAL      PLAN: PT FREQUENCY:  1 time per week currently and will transition to 2x/wk when needed   PT DURATION: other: 10-12 weeks   PLANNED INTERVENTIONS: Therapeutic exercises, Therapeutic activity, Neuro Muscular re-education, Balance training, Gait training, Patient/Family education, Stair training, DME instructions, Aquatic Therapy, Cryotherapy, Taping, and Manual therapy   PLAN FOR NEXT SESSION: NO E-STIM--Pt has a PACEMAKER.  Review and perform HEP.  Progress core exercises per kyphoplasty protocol.  Perform DLS on airex pad next visit.  Cont per kyphoplasty protocol with regards to stress fractures.       Carolyne Littles PT, DPT  04/02/21 11:06 AM

## 2021-04-04 ENCOUNTER — Other Ambulatory Visit: Payer: Self-pay

## 2021-04-04 ENCOUNTER — Encounter (HOSPITAL_BASED_OUTPATIENT_CLINIC_OR_DEPARTMENT_OTHER): Payer: Self-pay | Admitting: Physical Therapy

## 2021-04-04 ENCOUNTER — Ambulatory Visit (HOSPITAL_BASED_OUTPATIENT_CLINIC_OR_DEPARTMENT_OTHER): Payer: Medicare Other | Admitting: Physical Therapy

## 2021-04-04 DIAGNOSIS — G8929 Other chronic pain: Secondary | ICD-10-CM | POA: Diagnosis not present

## 2021-04-04 DIAGNOSIS — M545 Low back pain, unspecified: Secondary | ICD-10-CM

## 2021-04-04 DIAGNOSIS — M6281 Muscle weakness (generalized): Secondary | ICD-10-CM

## 2021-04-04 DIAGNOSIS — R262 Difficulty in walking, not elsewhere classified: Secondary | ICD-10-CM | POA: Diagnosis not present

## 2021-04-04 NOTE — Therapy (Signed)
OUTPATIENT PHYSICAL THERAPY TREATMENT NOTE   Patient Name: Todd Chapman MRN: [id number] DOB:04-14-1945, 76 y.o., male Today's Date: 04/04/2021  PCP: [name], MD    PT End of Session - 04/04/21 1054     Visit Number 4    Number of Visits 18    Date for PT Re-Evaluation 05/30/21    Authorization Type Medicare A&B    Progress Note Due on Visit 10    PT Start Time 1056    PT Stop Time 1150    PT Time Calculation (min) 54 min    Activity Tolerance Patient tolerated treatment well    Behavior During Therapy WFL for tasks assessed/performed              Past Medical History:  Diagnosis Date   Arthritis    "thumbs, back" (02/16/2017)   Bradycardia 02/2017   Dyspnea    with exertion   Flu 2 weeks ago   GERD (gastroesophageal reflux disease)    Hepatitis A as child   HLD (hyperlipidemia)    Persistent atrial fibrillation ([place]) 12/18/2016   Pneumonia 1999; 2018   Presence of permanent cardiac pacemaker    st jude   Prostate cancer [facility])    Past Surgical History:  Procedure Laterality Date   APPENDECTOMY     age 26   CARDIOVERSION  02/16/2017   CARDIOVERSION N/A 02/16/2017   Procedure: CARDIOVERSION;  Surgeon: Thompson Grayer, MD;  Location: [place] [facility];  Service: Cardiovascular;  Laterality: N/A;   COLONOSCOPY WITH PROPOFOL N/A 12/13/2012   Procedure: COLONOSCOPY WITH PROPOFOL;  Surgeon: Garlan Fair, MD;  Location: [facility];  Service: Endoscopy;  Laterality: N/A;   FRACTURE SURGERY     HYDROCELE EXCISION Left 05/16/2018   Procedure: HYDROCELECTOMY ADULT;  Surgeon: [name], MD;  Location: [facility];  Service: Urology;  Laterality: Left;   INSERT / REPLACE / REMOVE PACEMAKER  02/16/2017   KYPHOPLASTY N/A 02/10/2021   Procedure: KYPHOPLASTY L-5;  Surgeon: Newman Pies, MD;  Location: [place];  Service: Neurosurgery;  Laterality: N/A;   LAPAROSCOPY N/A 06/04/2020   Procedure: POSSIBLE LAPAROTOMY POSSIBLE BOWEL RESSECTION;   Surgeon: Clovis Riley, MD;  Location: [facility];  Service: General;  Laterality: N/A;   PACEMAKER IMPLANT N/A 02/16/2017   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 by Dr Rayann Heman   PROSTATE BIOPSY  11/23/2019   WRIST FRACTURE SURGERY Left    with pins   Patient Active Problem List   Diagnosis Date Noted   Vertebral compression fracture ([place]) 02/12/2021   Bilateral acetabular fractures, closed, initial encounter ([place]) 02/08/2021   Closed compression fracture of L5 lumbar vertebra, initial encounter ([place]) 02/08/2021   Back pain 02/06/2021   SBO (small bowel obstruction) ([place]) 06/01/2020   Pacemaker 05/13/2020   Malignant neoplasm of prostate ([place]) 01/09/2020   Annual physical exam 01/08/2020   Acute upper respiratory infection 01/08/2020   Asymptomatic varicose veins of lower extremity 01/08/2020   Bronchitis 01/08/2020   Cataract 01/08/2020   Constipation 01/08/2020   Diverticulosis of large intestine without perforation or abscess without bleeding 01/08/2020   Screening for gout 01/08/2020   False positive serological test for syphilis 01/08/2020   Gastro-esophageal reflux disease without esophagitis 01/08/2020   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   History of colonic polyps 01/08/2020   Impacted cerumen 01/08/2020   Insomnia 01/08/2020   Osteoarthritis 01/08/2020   Polypharmacy 01/08/2020   Pure hypercholesterolemia 01/08/2020   Rosacea  01/08/2020   Combined forms of age-related cataract of both eyes 07/13/2019   Elevated blood pressure reading 11/08/2018   Sick sinus syndrome ([place]) 02/16/2017   Persistent atrial fibrillation ([place]) 12/18/2016   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   Nonexudative age-related macular degeneration, bilateral, 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   Dermatochalasis of both upper eyelids 07/25/2014   Nuclear sclerosis of both eyes 07/25/2014   Bradycardia 05/29/2014   Premature atrial contractions  05/29/2014   Premature junctional contractions ([place]) 05/29/2014   Hyperlipidemia 05/29/2014   Encounter for general adult medical examination with abnormal findings 04/24/2014   Vitamin D deficiency 04/24/2014    REFERRING PROVIDER: Barbie Banner, PA-C    REFERRING DIAG: [phone] (ICD-10-CM) - Collapsed vertebra, not elsewhere classified, site unspecified, initial encounter for fracture    THERAPY DIAG:  Chronic low back pain without sciatica, unspecified back pain laterality   Muscle weakness (generalized)   Difficulty in walking, not elsewhere classified   ONSET DATE: Bilateral L5 kyphoplasty and vertebral body biopsies on 02/10/2021   SUBJECTIVE:                                                                                                                                                                                            SUBJECTIVE STATEMENT: EVAL: -Pt began having back pain in late 2021 with no specific MOI though did coincide with dx of prostate CA.  Pt had Rx for prostate CA in Jan and Feb of 2022.   Pt underwent Bilateral L5 kyphoplasty and vertebral body biopsies by Dr. Earle Gell on 02/10/2021.  Pt states the biopsies came back negative for CA.  Pt was discharged from hospital on 02/21/2021 and used the walker for 5 more days. Pt had PT in the hospital.   -Pt saw Dr. Lyla Glassing on 03/04/2021 and gave him an order for PT for Pubic Rami stress fractures.  Eval and treat bilt pubic rami stress fx and troch bursitis.     -Pt is 6 weeks post op.  Pt denies any adverse effects after prior Rx.  Pt states he is unstable with gait but can use external supports with ambulating in his home.  Pt states he has a balance issue.  Pt doesn't use the walker inside his home.  Pt ambulated into facility without walker today but had his wife for support.  Pt uses his brace most of the day.  Pt reports improved ambulation since prior visit.  He is limited with ambulation distance.  Pt's  standing duration is limited though is improving.  Pt has been performing resisted exercises including LAQ with 5#, shoulder  press with 10#, lateral raises with 10#, tricep kickbacks with weight, sidestepping with band, and squats.  Pt's wife present during Rx and states he has swelling in bilat Les.  04/04/21: Pt reports he is taking less pain medicine.  The stretches "help immensely".    PERTINENT HISTORY:  02/10/2021 bilat L5 kyphoplasty,  bilat acetabular stress fractures and pubic rami stress Fx; Grade 1 anterolisthesis at L4-L5; Pacemaker, A-fib, hx of prostate CA    PAIN:  Are you having pain? Yes 1/25 NPRS scale: 5/10 current, nagging, dull, aching Pain location:  Central lumbar and R sided LBP  Aggravating factors: standing Relieving factors: meds   PRECAUTIONS: Back--No bending, lifting > 10# , twisting   WEIGHT BEARING RESTRICTIONS No     PATIENT GOALS to return to the gym; improve functional mobility     OBJECTIVE:    DIAGNOSTIC FINDINGS: (In Epic) -MRI on 02/07/2021 (prior to surgery) which showed Grade 1 anterolisthesis at L4-L5 and L5 inferior endplate compression fracture with less than 50% loss of height. -Earlier MRI showed Moderate osteoarthritis of bilateral hips and bilat acetabular stress/insufficiency fractures.  MRI indicated differential considerations include severe stress reaction possible developing insufficiency fracture versus underlying malignancy secondary to metastatic disease given the patient's history of prostate cancer. -MRI pelvis: 1. Bilateral acetabular/supra-acetabular stress fractures in the pelvis. There is likewise some stress fracture involving the left medial superior pubic ramus. This overall represents a progression compared to the previous unilateral left acetabular stress fracture shown on 11/04/2020.     04/04/21:   SLS - LLE x 12 sec,  RLE x 5 sec (with pain)   TODAY'S TREATMENT  04/04/21: Therapeutic Exercise:   NuStep L5 (legs  only)  Supine piriformis stretch, Travell, 20 sec x 3 reps each leg.   Supine heel slides with TA (leg off of table) x 10 each.   Supine marching x 10 with TA  LTR with limited range, arms in T  Supine hamstring stretch with strap x 2 reps of 20 LLE, x 15 sec on RLE (increased buttock pain)  Supine R LE nerve glides with hip flexed at 90, flex/ext knee x 10  Standing resisted bilat shoulder ext, red band x 10, 3 sec pause in retraction  Standing resisted bilat shoulder row, red band x 10, 3 sec pause in retraction  Side stepping holding onto wall x 10 ft R/L with core engaged, cues to slow speed and not drag Rt foot.  Seated TA with pressing into/up on table x 5 sec x 3 reps   Discussed option for shin stretch from quadruped (per pt request, with demo provided)      1/25 Supine marching with TrA 2x10 reps also reviewed switch progression  Supine PPT with TRA 2x15   LTR x15 each side 5 second hold in low range  Gluteal stretch 3x20 sec hold   Standing shoulder extension 2x10 red also given green band for home  Standing scap retraction 2x10 red band also given green   Reviewed self soft tissue mobilization with tennis ball        1/16  Therapeutic Exercise:   -Reviewed current function, pain level, response to prior Rx, exercise program. -PT spent Extensive time educating on what activities and exercises to avoids and the rationale.  PT answered Pt's questions.  See Pt education below for details. -Reviewed HEP from hospital.  Pt received a HEP handout and was educated in correct form and appropriate frequency. -Pt performed  Educated pt in  correct performance and palpation of TrA contraction.  Pt performed TrA contractions in supine with 5sec hold.   Supine marching with TrA 2x10 reps  Supine heel slides with Tra 2x10 reps  Kinetic activities: -PT educated pt in correct form and positioning of log rolling.  Pt performed log rolling in the clinic with supine/sit transfers.           PATIENT EDUCATION:  Education details:  Reviewed HEP, added additional exercises.   Person educated: Patient  Education method: Explanation, demonstration, verbal cues, tactile cues, and handout Education comprehension: verbalized understanding and returned demonstration, verbal cues and tactile cues required     HOME EXERCISE PROGRAM: Access Code: [email or web address] URL: [email or web address] Date: 04/04/2021 Prepared by: [facility]  Exercises Supine Lower Trunk Rotation - 1 x daily - 7 x weekly - 3 sets - 10 reps Standing Glute Med Mobilization with Small Ball on Wall - 1 x daily - 7 x weekly - 3 sets - 10 reps Supine Piriformis Stretch with Foot on Ground - 1 x daily - 7 x weekly - 3 sets - 10 reps Supine March - 1-2 x daily - 7 x weekly - 2 sets - 10 reps Supine Core Control with Heel Slide - 1-2 x daily - 7 x weekly - 1-2 sets - 10 reps Shoulder extension with resistance - Neutral - 1 x daily - 7 x weekly - 2 sets - 10 reps - 3 hold Scapular Retraction with Resistance - 1 x daily - 7 x weekly - 2 sets - 10 reps - 3 hold Seated Transversus Abdominis Bracing - 1 x daily - 7 x weekly - 10 reps - 5-10 seconds hold Side Stepping with Counter Support - 1 x daily - 7 x weekly - 3 sets - 10 reps    ASSESSMENT:   CLINICAL IMPRESSION: Pt tolerated exercises well without increase in pain, just fatigue.  He did report increased back and Rt buttock pain when testing SLS today; resolved with rest.  Encouraged pt to dial back bicycle in supine to avoid aggravating LB symptoms.  Minor cues given throughout on form.  Pt has met STG 1 +2.     Objective impairments include Abnormal gait, decreased activity tolerance, decreased balance, decreased endurance, decreased mobility, difficulty walking, decreased ROM, decreased strength, hypomobility, impaired flexibility, postural dysfunction, and pain. These impairments are limiting patient from cleaning,  community activity, meal prep, occupation, laundry, shopping, and ambulation . Personal factors including 3+ comorbidities:  bilat acetabular stress fractures and pubic rami stress Fx; Pacemaker, A-fib, and hx of prostate CA   are also affecting patient's functional outcome.      REHAB POTENTIAL: Good   CLINICAL DECISION MAKING: Evolving/moderate complexity   EVALUATION COMPLEXITY: Moderate     GOALS:     SHORT TERM GOALS:   STG Name Target Date Goal status  1 Pt will be independent and compliant with HEP for improved pain, strength, and mobility.  Baseline:  03/28/2021 ACHIEVED  2 Pt will demo improved limp with gait without AD Baseline:  04/04/2021 ACHIEVED  3 Pt will progress with walking program without adverse effects.  Baseline: 04/18/2021 ONGOING  4 Pt will be able to perform bed mobility without increased pain or difficulty.  Baseline: 04/11/2021 ONGOING  5 Pt will perform core exercises without adverse effects for improved core strength in order to perform daily activities and functional mobility with reduced stress on lumbar, increased ease, and reduced pain Baseline: 04/04/2021 ONGOING  LONG TERM GOALS:    LTG Name Target Date Goal status  1 Pt will be able to perform occupational activities without significant pain or difficulty. Baseline: 05/30/2021 INITIAL  2 Pt will be able to perform extended community ambulation without an AD without increased pain or difficulty.  Baseline: 05/30/2021 INITIAL  3 Pt will ambulate without an AD without limping.  Baseline: 05/16/2021 INITIAL  4 Pt will be able to perform IADLs and household chores including his normal standing activities such as preparing food and cooking without significant pain or limitation.  Baseline: 05/30/2021 INITIAL      PLAN: PT FREQUENCY:  1 time per week currently and will transition to 2x/wk when needed   PT DURATION: other: 10-12 weeks   PLANNED INTERVENTIONS: Therapeutic exercises,  Therapeutic activity, Neuro Muscular re-education, Balance training, Gait training, Patient/Family education, Stair training, DME instructions, Aquatic Therapy, Cryotherapy, Taping, and Manual therapy   PLAN FOR NEXT SESSION: NO E-STIM--Pt has a PACEMAKER.   Progress core exercises per kyphoplasty protocol.   Cont per kyphoplasty protocol with regards to stress fractures. Progress HEP as tolerated.      Kerin Perna, PTA 04/04/21 12:17 PM

## 2021-04-07 ENCOUNTER — Other Ambulatory Visit: Payer: Self-pay

## 2021-04-07 ENCOUNTER — Ambulatory Visit (HOSPITAL_BASED_OUTPATIENT_CLINIC_OR_DEPARTMENT_OTHER): Payer: Medicare Other | Admitting: Physical Therapy

## 2021-04-07 DIAGNOSIS — M6281 Muscle weakness (generalized): Secondary | ICD-10-CM

## 2021-04-07 DIAGNOSIS — G8929 Other chronic pain: Secondary | ICD-10-CM | POA: Diagnosis not present

## 2021-04-07 DIAGNOSIS — M545 Low back pain, unspecified: Secondary | ICD-10-CM

## 2021-04-07 DIAGNOSIS — R262 Difficulty in walking, not elsewhere classified: Secondary | ICD-10-CM

## 2021-04-07 NOTE — Therapy (Signed)
OUTPATIENT PHYSICAL THERAPY TREATMENT NOTE   Patient Name: Todd Chapman MRN: [id number] DOB:22-Oct-1945, 76 y.o., male Today's Date: 04/08/2021  PCP: [name], MD    PT End of Session - 04/07/21 1239     Visit Number 5    Number of Visits 18    Date for PT Re-Evaluation 05/30/21    Authorization Type Medicare A&B    Progress Note Due on Visit 10    PT Start Time 1156    PT Stop Time 8563    PT Time Calculation (min) 35 min    Activity Tolerance Patient tolerated treatment well    Behavior During Therapy WFL for tasks assessed/performed               Past Medical History:  Diagnosis Date   Arthritis    "thumbs, back" (02/16/2017)   Bradycardia 02/2017   Dyspnea    with exertion   Flu 2 weeks ago   GERD (gastroesophageal reflux disease)    Hepatitis A as child   HLD (hyperlipidemia)    Persistent atrial fibrillation ([place]) 12/18/2016   Pneumonia 1999; 2018   Presence of permanent cardiac pacemaker    st jude   Prostate cancer [facility])    Past Surgical History:  Procedure Laterality Date   APPENDECTOMY     age 34   CARDIOVERSION  02/16/2017   CARDIOVERSION N/A 02/16/2017   Procedure: CARDIOVERSION;  Surgeon: Thompson Grayer, MD;  Location: [place] [facility];  Service: Cardiovascular;  Laterality: N/A;   COLONOSCOPY WITH PROPOFOL N/A 12/13/2012   Procedure: COLONOSCOPY WITH PROPOFOL;  Surgeon: Garlan Fair, MD;  [facility];  Service: Endoscopy;  Laterality: N/A;   FRACTURE SURGERY     HYDROCELE EXCISION Left 05/16/2018   Procedure: HYDROCELECTOMY ADULT;  Surgeon: [name], MD;  Location: [facility];  Service: Urology;  Laterality: Left;   INSERT / REPLACE / REMOVE PACEMAKER  02/16/2017   KYPHOPLASTY N/A 02/10/2021   Procedure: KYPHOPLASTY L-5;  Surgeon: Newman Pies, MD;  Location: [place];  Service: Neurosurgery;  Laterality: N/A;   LAPAROSCOPY N/A 06/04/2020   Procedure: POSSIBLE LAPAROTOMY POSSIBLE BOWEL  RESSECTION;  Surgeon: Clovis Riley, MD;  [facility];  Service: General;  Laterality: N/A;   PACEMAKER IMPLANT N/A 02/16/2017   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 by Dr Rayann Heman   PROSTATE BIOPSY  11/23/2019   WRIST FRACTURE SURGERY Left    with pins   Patient Active Problem List   Diagnosis Date Noted   Vertebral compression fracture ([place]) 02/12/2021   Bilateral acetabular fractures, closed, initial encounter ([place]) 02/08/2021   Closed compression fracture of L5 lumbar vertebra, initial encounter ([place]) 02/08/2021   Back pain 02/06/2021   SBO (small bowel obstruction) ([place]) 06/01/2020   Pacemaker 05/13/2020   Malignant neoplasm of prostate ([place]) 01/09/2020   Annual physical exam 01/08/2020   Acute upper respiratory infection 01/08/2020   Asymptomatic varicose veins of lower extremity 01/08/2020   Bronchitis 01/08/2020   Cataract 01/08/2020   Constipation 01/08/2020   Diverticulosis of large intestine without perforation or abscess without bleeding 01/08/2020   Screening for gout 01/08/2020   False positive serological test for syphilis 01/08/2020   Gastro-esophageal reflux disease without esophagitis 01/08/2020   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   History of colonic polyps 01/08/2020   Impacted cerumen 01/08/2020   Insomnia 01/08/2020   Osteoarthritis 01/08/2020   Polypharmacy 01/08/2020   Pure hypercholesterolemia 01/08/2020  Rosacea 01/08/2020   Combined forms of age-related cataract of both eyes 07/13/2019   Elevated blood pressure reading 11/08/2018   Sick sinus syndrome ([place]) 02/16/2017   Persistent atrial fibrillation ([place]) 12/18/2016   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   Nonexudative age-related macular degeneration, bilateral, 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   Dermatochalasis of both upper eyelids 07/25/2014   Nuclear sclerosis of both eyes 07/25/2014   Bradycardia 05/29/2014   Premature atrial  contractions 05/29/2014   Premature junctional contractions ([place]) 05/29/2014   Hyperlipidemia 05/29/2014   Encounter for general adult medical examination with abnormal findings 04/24/2014   Vitamin D deficiency 04/24/2014    REFERRING PROVIDER: Barbie Banner, PA-C    REFERRING DIAG: [id number] (ICD-10-CM) - Collapsed vertebra, not elsewhere classified, site unspecified, initial encounter for fracture    THERAPY DIAG:  Chronic low back pain without sciatica, unspecified back pain laterality   Muscle weakness (generalized)   Difficulty in walking, not elsewhere classified   ONSET DATE: Bilateral L5 kyphoplasty and vertebral body biopsies on 02/10/2021   SUBJECTIVE:                                                                                                                                                                                            SUBJECTIVE STATEMENT:   -Pt saw Dr. Lyla Glassing on 03/04/2021 and gave him an order for PT for Pubic Rami stress fractures.  Eval and treat bilt pubic rami stress fx and troch bursitis.   -Pt is 8 weeks s/p Bilateral L5 kyphoplasty and vertebral body biopsies by Dr. Earle Gell on 02/10/2021.  Pt states the biopsies came back negative for CA.   -Pt reports he is taking less pain medicine, but he did have to take oxycodone yesterday.  He denies any adverse effects after prior Rx.  He has some pain with HEP though states it's better.  Pt states he went to the grocery store with wife and was out and about for 5 hours.  He states that was too much, he had increased pain.  Pt reports improved ambulation.  Pt reports compliance with HEP.     PERTINENT HISTORY:  02/10/2021 bilat L5 kyphoplasty,  bilat acetabular stress fractures and pubic rami stress Fx; Grade 1 anterolisthesis at L4-L5; PACEMAKER, A-fib, hx of prostate CA    PAIN:  Are you having pain? Yes NPRS scale: 5-6/10 current Pain location:  Central lumbar and R sided LBP  Aggravating  factors: standing Relieving factors: meds   PRECAUTIONS: Back--No bending, lifting > 10# , twisting   WEIGHT BEARING RESTRICTIONS No     PATIENT GOALS to return  to the gym; improve functional mobility     OBJECTIVE:    DIAGNOSTIC FINDINGS: (In Epic) -MRI on 02/07/2021 (prior to surgery) which showed Grade 1 anterolisthesis at L4-L5 and L5 inferior endplate compression fracture with less than 50% loss of height. -Earlier MRI showed Moderate osteoarthritis of bilateral hips and bilat acetabular stress/insufficiency fractures.  MRI indicated differential considerations include severe stress reaction possible developing insufficiency fracture versus underlying malignancy secondary to metastatic disease given the patient's history of prostate cancer. -MRI pelvis: 1. Bilateral acetabular/supra-acetabular stress fractures in the pelvis. There is likewise some stress fracture involving the left medial superior pubic ramus. This overall represents a progression compared to the previous unilateral left acetabular stress fracture shown on 11/04/2020.    TODAY'S TREATMENT  Therapeutic Exercise:  -Reviewed response to prior Rx, HEP compliance, current function, and pain level. -Pt performed:  Supine marching with TrA 2x10  Supine alt UE/LE with TrA 2x10   Supine alt LE ext with TrA 2x10  Supine HS stretch with strap 3x20-30 sec bilat    Standing shoulder extension with RTB with TrA 2x10  Standing scap retraction with GTB with TrA 2x10  Side stepping holding onto wall  Standing on airex with FA x 50 sec, FT 3x30 sec, FA with EC 3x20 sec       PATIENT EDUCATION:  Education details:  Exercise form and POC. Person educated: Patient  Education method: Explanation, demonstration, verbal cues, tactile cues Education comprehension: verbalized understanding and returned demonstration, verbal cues and tactile cues required     HOME EXERCISE PROGRAM: Access Code: [email or web address] URL:  [email or web address] Date: 04/04/2021 Prepared by: [place]  Exercises Supine Lower Trunk Rotation - 1 x daily - 7 x weekly - 3 sets - 10 reps Standing Glute Med Mobilization with Small Ball on Wall - 1 x daily - 7 x weekly - 3 sets - 10 reps Supine Piriformis Stretch with Foot on Ground - 1 x daily - 7 x weekly - 3 sets - 10 reps Supine March - 1-2 x daily - 7 x weekly - 2 sets - 10 reps Supine Core Control with Heel Slide - 1-2 x daily - 7 x weekly - 1-2 sets - 10 reps Shoulder extension with resistance - Neutral - 1 x daily - 7 x weekly - 2 sets - 10 reps - 3 hold Scapular Retraction with Resistance - 1 x daily - 7 x weekly - 2 sets - 10 reps - 3 hold Seated Transversus Abdominis Bracing - 1 x daily - 7 x weekly - 10 reps - 5-10 seconds hold Side Stepping with Counter Support - 1 x daily - 7 x weekly - 3 sets - 10 reps    ASSESSMENT:   CLINICAL IMPRESSION: Pt is progressing well with function, strength, and tolerance to activity.  He is progressing with core exercises and performed exercises well with cuing for correct form.  He is compliant with HEP.  Pt reports improved ambulation and is increasing his community ambulation.  Pt responded well to Rx having no increased pain after Rx.   Pt should cont to benefit from cont skilled PT services to address ongoing goals and to restore desired level of function.    Objective impairments include Abnormal gait, decreased activity tolerance, decreased balance, decreased endurance, decreased mobility, difficulty walking, decreased ROM, decreased strength, hypomobility, impaired flexibility, postural dysfunction, and pain. These impairments are limiting patient from cleaning, community activity, meal prep, occupation, laundry, shopping, and ambulation .  Personal factors including 3+ comorbidities:  bilat acetabular stress fractures and pubic rami stress Fx; Pacemaker, A-fib, and hx of prostate CA   are also  affecting patient's functional outcome.      REHAB POTENTIAL: Good   CLINICAL DECISION MAKING: Evolving/moderate complexity   EVALUATION COMPLEXITY: Moderate     GOALS:     SHORT TERM GOALS:   STG Name Target Date Goal status  1 Pt will be independent and compliant with HEP for improved pain, strength, and mobility.  Baseline:  03/28/2021 ACHIEVED  2 Pt will demo improved limp with gait without AD Baseline:  04/04/2021 ACHIEVED  3 Pt will progress with walking program without adverse effects.  Baseline: 04/18/2021 ONGOING  4 Pt will be able to perform bed mobility without increased pain or difficulty.  Baseline: 04/11/2021 ONGOING  5 Pt will perform core exercises without adverse effects for improved core strength in order to perform daily activities and functional mobility with reduced stress on lumbar, increased ease, and reduced pain Baseline: 04/04/2021 ONGOING                      LONG TERM GOALS:    LTG Name Target Date Goal status  1 Pt will be able to perform occupational activities without significant pain or difficulty. Baseline: 05/30/2021 INITIAL  2 Pt will be able to perform extended community ambulation without an AD without increased pain or difficulty.  Baseline: 05/30/2021 INITIAL  3 Pt will ambulate without an AD without limping.  Baseline: 05/16/2021 INITIAL  4 Pt will be able to perform IADLs and household chores including his normal standing activities such as preparing food and cooking without significant pain or limitation.  Baseline: 05/30/2021 INITIAL      PLAN: PT FREQUENCY:  1 time per week currently and will transition to 2x/wk when needed   PT DURATION: other: 10-12 weeks   PLANNED INTERVENTIONS: Therapeutic exercises, Therapeutic activity, Neuro Muscular re-education, Balance training, Gait training, Patient/Family education, Stair training, DME instructions, Aquatic Therapy, Cryotherapy, Taping, and Manual therapy   PLAN FOR NEXT SESSION: NO  E-STIM--Pt has a PACEMAKER.   Progress core exercises per kyphoplasty protocol.   Cont per kyphoplasty protocol with regards to stress fractures. Progress HEP as tolerated.      Selinda Michaels III PT, DPT 04/08/21 8:16 AM

## 2021-04-08 ENCOUNTER — Encounter (HOSPITAL_BASED_OUTPATIENT_CLINIC_OR_DEPARTMENT_OTHER): Payer: Self-pay | Admitting: Physical Therapy

## 2021-04-09 ENCOUNTER — Encounter (HOSPITAL_BASED_OUTPATIENT_CLINIC_OR_DEPARTMENT_OTHER): Payer: Self-pay | Admitting: Physical Therapy

## 2021-04-09 ENCOUNTER — Other Ambulatory Visit: Payer: Self-pay

## 2021-04-09 ENCOUNTER — Ambulatory Visit (HOSPITAL_BASED_OUTPATIENT_CLINIC_OR_DEPARTMENT_OTHER): Payer: Medicare Other | Attending: Physician Assistant | Admitting: Physical Therapy

## 2021-04-09 DIAGNOSIS — R262 Difficulty in walking, not elsewhere classified: Secondary | ICD-10-CM | POA: Insufficient documentation

## 2021-04-09 DIAGNOSIS — M545 Low back pain, unspecified: Secondary | ICD-10-CM | POA: Diagnosis not present

## 2021-04-09 DIAGNOSIS — M6281 Muscle weakness (generalized): Secondary | ICD-10-CM | POA: Diagnosis not present

## 2021-04-09 DIAGNOSIS — G8929 Other chronic pain: Secondary | ICD-10-CM | POA: Diagnosis not present

## 2021-04-09 NOTE — Therapy (Signed)
OUTPATIENT PHYSICAL THERAPY TREATMENT NOTE   Patient Name: Todd Chapman MRN: [id number] DOB:04/10/1945, 76 y.o., male Today's Date: 04/09/2021  PCP: [name], MD    PT End of Session - 04/09/21 1200     Visit Number 6    Number of Visits 18    Date for PT Re-Evaluation 05/30/21    Authorization Type Medicare A&B    Progress Note Due on Visit 10    PT Start Time 1151    PT Stop Time 1233    PT Time Calculation (min) 42 min    Activity Tolerance Patient tolerated treatment well    Behavior During Therapy WFL for tasks assessed/performed               Past Medical History:  Diagnosis Date   Arthritis    "thumbs, back" (02/16/2017)   Bradycardia 02/2017   Dyspnea    with exertion   Flu 2 weeks ago   GERD (gastroesophageal reflux disease)    Hepatitis A as child   HLD (hyperlipidemia)    Persistent atrial fibrillation ([place]) 12/18/2016   Pneumonia 1999; 2018   Presence of permanent cardiac pacemaker    st jude   Prostate cancer [facility])    Past Surgical History:  Procedure Laterality Date   APPENDECTOMY     age 62   CARDIOVERSION  02/16/2017   CARDIOVERSION N/A 02/16/2017   Procedure: CARDIOVERSION;  Surgeon: Thompson Grayer, MD;  Location: [place] [facility];  Service: Cardiovascular;  Laterality: N/A;   COLONOSCOPY WITH PROPOFOL N/A 12/13/2012   Procedure: COLONOSCOPY WITH PROPOFOL;  Surgeon: Garlan Fair, MD;  [facility];  Service: Endoscopy;  Laterality: N/A;   FRACTURE SURGERY     HYDROCELE EXCISION Left 05/16/2018   Procedure: HYDROCELECTOMY ADULT;  Surgeon: [name], MD;  [facility];  Service: Urology;  Laterality: Left;   INSERT / REPLACE / REMOVE PACEMAKER  02/16/2017   KYPHOPLASTY N/A 02/10/2021   Procedure: KYPHOPLASTY L-5;  Surgeon: Newman Pies, MD;  Location: [place];  Service: Neurosurgery;  Laterality: N/A;   LAPAROSCOPY N/A 06/04/2020   Procedure: POSSIBLE LAPAROTOMY POSSIBLE BOWEL  RESSECTION;  Surgeon: Clovis Riley, MD;  [facility];  Service: General;  Laterality: N/A;   PACEMAKER IMPLANT N/A 02/16/2017   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 by Dr Rayann Heman   PROSTATE BIOPSY  11/23/2019   WRIST FRACTURE SURGERY Left    with pins   Patient Active Problem List   Diagnosis Date Noted   Vertebral compression fracture ([place]) 02/12/2021   Bilateral acetabular fractures, closed, initial encounter ([place]) 02/08/2021   Closed compression fracture of L5 lumbar vertebra, initial encounter ([place]) 02/08/2021   Back pain 02/06/2021   SBO (small bowel obstruction) ([place]) 06/01/2020   Pacemaker 05/13/2020   Malignant neoplasm of prostate ([place]) 01/09/2020   Annual physical exam 01/08/2020   Acute upper respiratory infection 01/08/2020   Asymptomatic varicose veins of lower extremity 01/08/2020   Bronchitis 01/08/2020   Cataract 01/08/2020   Constipation 01/08/2020   Diverticulosis of large intestine without perforation or abscess without bleeding 01/08/2020   Screening for gout 01/08/2020   False positive serological test for syphilis 01/08/2020   Gastro-esophageal reflux disease without esophagitis 01/08/2020   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   History of colonic polyps 01/08/2020   Impacted cerumen 01/08/2020   Insomnia 01/08/2020   Osteoarthritis 01/08/2020   Polypharmacy 01/08/2020   Pure hypercholesterolemia 01/08/2020  Rosacea 01/08/2020   Combined forms of age-related cataract of both eyes 07/13/2019   Elevated blood pressure reading 11/08/2018   Sick sinus syndrome ([place]) 02/16/2017   Persistent atrial fibrillation ([place]) 12/18/2016   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   Nonexudative age-related macular degeneration, bilateral, 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   Dermatochalasis of both upper eyelids 07/25/2014   Nuclear sclerosis of both eyes 07/25/2014   Bradycardia 05/29/2014   Premature atrial  contractions 05/29/2014   Premature junctional contractions ([place]) 05/29/2014   Hyperlipidemia 05/29/2014   Encounter for general adult medical examination with abnormal findings 04/24/2014   Vitamin D deficiency 04/24/2014    REFERRING PROVIDER: Barbie Banner, PA-C    REFERRING DIAG: [id number] (ICD-10-CM) - Collapsed vertebra, not elsewhere classified, site unspecified, initial encounter for fracture    THERAPY DIAG:  Chronic low back pain without sciatica, unspecified back pain laterality   Muscle weakness (generalized)   Difficulty in walking, not elsewhere classified   ONSET DATE: Bilateral L5 kyphoplasty and vertebral body biopsies on 02/10/2021   SUBJECTIVE:                                                                                                                                                                                            SUBJECTIVE STATEMENT:   -Pt saw Dr. Lyla Glassing on 03/04/2021 and gave him an order for PT for Pubic Rami stress fractures.  Eval and treat bilt pubic rami stress fx and troch bursitis.   -Pt is 8 weeks and 2 days s/p Bilateral L5 kyphoplasty and vertebral body biopsies by Dr. Earle Gell on 02/10/2021.  Pt states the biopsies came back negative for CA.   -Pt reports he is taking less pain medicine.  He denies any adverse effects after prior Rx.  Pt reports compliance with HEP.  Pt states he feels better after he performs his home exercises.  Pt reports having pain at the end of the day.  Pt reports improved ambulation.  Pt is not driving and has not been released yet to drive.      PERTINENT HISTORY:  02/10/2021 bilat L5 kyphoplasty,  bilat acetabular stress fractures and pubic rami stress Fx; Grade 1 anterolisthesis at L4-L5; PACEMAKER, A-fib, hx of prostate CA    PAIN:  Are you having pain? Yes NPRS scale: 5-6/10 current Pain location:  Central lumbar and R sided LBP  Aggravating factors: standing Relieving factors: meds    PRECAUTIONS: Back--No bending, lifting > 10# , twisting   WEIGHT BEARING RESTRICTIONS No     PATIENT GOALS to return to the gym; improve functional mobility  OBJECTIVE:    DIAGNOSTIC FINDINGS: (In Epic) -MRI on 02/07/2021 (prior to surgery) which showed Grade 1 anterolisthesis at L4-L5 and L5 inferior endplate compression fracture with less than 50% loss of height. -Earlier MRI showed Moderate osteoarthritis of bilateral hips and bilat acetabular stress/insufficiency fractures.  MRI indicated differential considerations include severe stress reaction possible developing insufficiency fracture versus underlying malignancy secondary to metastatic disease given the patient's history of prostate cancer. -MRI pelvis: 1. Bilateral acetabular/supra-acetabular stress fractures in the pelvis. There is likewise some stress fracture involving the left medial superior pubic ramus. This overall represents a progression compared to the previous unilateral left acetabular stress fracture shown on 11/04/2020.    TODAY'S TREATMENT  Therapeutic Exercise:  -Reviewed response to prior Rx, HEP compliance, current function, and pain level. -Pt performed:  Supine alt UE/LE with TrA 3x10   Supine alt LE ext with TrA 2x10 while holding ball  Supine manual HS stretch 3x20-30 sec bilat.  Pt also received supine manual HS stretch last visit and did not perform HS stretch with strap   Standing shoulder extension with RTB with TrA 2x10  Standing scap retraction with GTB with TrA 2x10  Side stepping holding onto wall  Standing on airex with  FT 3x30 sec, FA with EC 3x20 sec Nustep at L5 x 4 mins       PATIENT EDUCATION:  Education details:  Exercise form and POC. Person educated: Patient  Education method: Explanation, demonstration, verbal cues, tactile cues Education comprehension: verbalized understanding and returned demonstration, verbal cues and tactile cues required     HOME EXERCISE  PROGRAM: Access Code: [email or web address] URL: [email or web address] Date: 04/04/2021 Prepared by: [place]  Exercises Supine Lower Trunk Rotation - 1 x daily - 7 x weekly - 3 sets - 10 reps Standing Glute Med Mobilization with Small Ball on Wall - 1 x daily - 7 x weekly - 3 sets - 10 reps Supine Piriformis Stretch with Foot on Ground - 1 x daily - 7 x weekly - 3 sets - 10 reps Supine March - 1-2 x daily - 7 x weekly - 2 sets - 10 reps Supine Core Control with Heel Slide - 1-2 x daily - 7 x weekly - 1-2 sets - 10 reps Shoulder extension with resistance - Neutral - 1 x daily - 7 x weekly - 2 sets - 10 reps - 3 hold Scapular Retraction with Resistance - 1 x daily - 7 x weekly - 2 sets - 10 reps - 3 hold Seated Transversus Abdominis Bracing - 1 x daily - 7 x weekly - 10 reps - 5-10 seconds hold Side Stepping with Counter Support - 1 x daily - 7 x weekly - 3 sets - 10 reps    ASSESSMENT:   CLINICAL IMPRESSION: Pt is progressing well with function, strength, and tolerance to activity.  He is progressing with core exercises and performed exercises well with cuing for correct form.  Pt demonstrates improved exercise form with cuing.  He is compliant with HEP.  Pt is increasing his community ambulation.  Pt responded well to Rx stating he felt a little better after Rx, 5/10 pain.  Pt should cont to benefit from cont skilled PT services to address ongoing goals and to restore desired level of function.   Objective impairments include Abnormal gait, decreased activity tolerance, decreased balance, decreased endurance, decreased mobility, difficulty walking, decreased ROM, decreased strength, hypomobility, impaired flexibility, postural dysfunction, and pain. These impairments are limiting patient  from cleaning, community activity, meal prep, occupation, laundry, shopping, and ambulation . Personal factors including 3+ comorbidities:  bilat acetabular stress fractures and  pubic rami stress Fx; Pacemaker, A-fib, and hx of prostate CA   are also affecting patient's functional outcome.      REHAB POTENTIAL: Good   CLINICAL DECISION MAKING: Evolving/moderate complexity   EVALUATION COMPLEXITY: Moderate     GOALS:     SHORT TERM GOALS:   STG Name Target Date Goal status  1 Pt will be independent and compliant with HEP for improved pain, strength, and mobility.  Baseline:  03/28/2021 ACHIEVED  2 Pt will demo improved limp with gait without AD Baseline:  04/04/2021 ACHIEVED  3 Pt will progress with walking program without adverse effects.  Baseline: 04/18/2021 ONGOING  4 Pt will be able to perform bed mobility without increased pain or difficulty.  Baseline: 04/11/2021 ONGOING  5 Pt will perform core exercises without adverse effects for improved core strength in order to perform daily activities and functional mobility with reduced stress on lumbar, increased ease, and reduced pain Baseline: 04/04/2021 ONGOING                      LONG TERM GOALS:    LTG Name Target Date Goal status  1 Pt will be able to perform occupational activities without significant pain or difficulty. Baseline: 05/30/2021 INITIAL  2 Pt will be able to perform extended community ambulation without an AD without increased pain or difficulty.  Baseline: 05/30/2021 INITIAL  3 Pt will ambulate without an AD without limping.  Baseline: 05/16/2021 INITIAL  4 Pt will be able to perform IADLs and household chores including his normal standing activities such as preparing food and cooking without significant pain or limitation.  Baseline: 05/30/2021 INITIAL      PLAN: PT FREQUENCY:  1 time per week currently and will transition to 2x/wk when needed   PT DURATION: other: 10-12 weeks   PLANNED INTERVENTIONS: Therapeutic exercises, Therapeutic activity, Neuro Muscular re-education, Balance training, Gait training, Patient/Family education, Stair training, DME instructions, Aquatic Therapy,  Cryotherapy, Taping, and Manual therapy   PLAN FOR NEXT SESSION: NO E-STIM--Pt has a PACEMAKER.   Progress core exercises per kyphoplasty protocol.   Cont per kyphoplasty protocol with regards to stress fractures. Progress HEP as tolerated.      Selinda Michaels III PT, DPT 04/09/21 11:12 PM

## 2021-04-11 ENCOUNTER — Other Ambulatory Visit: Payer: Self-pay | Admitting: Internal Medicine

## 2021-04-11 DIAGNOSIS — I4819 Other persistent atrial fibrillation: Secondary | ICD-10-CM

## 2021-04-11 NOTE — Telephone Encounter (Signed)
Prescription refill request for Eliquis received. Indication: afib  Last office visit: [place], 05/13/2020 Scr: 0.96, 02/17/2021 Age: 76 yo  Weight: 93.1 kg   Refill sent.

## 2021-04-14 ENCOUNTER — Encounter (HOSPITAL_BASED_OUTPATIENT_CLINIC_OR_DEPARTMENT_OTHER): Payer: Medicare Other | Admitting: Physical Therapy

## 2021-04-14 ENCOUNTER — Encounter (HOSPITAL_BASED_OUTPATIENT_CLINIC_OR_DEPARTMENT_OTHER): Payer: Self-pay

## 2021-04-16 ENCOUNTER — Encounter (HOSPITAL_BASED_OUTPATIENT_CLINIC_OR_DEPARTMENT_OTHER): Payer: Self-pay | Admitting: Physical Therapy

## 2021-04-16 ENCOUNTER — Other Ambulatory Visit: Payer: Self-pay

## 2021-04-16 ENCOUNTER — Ambulatory Visit (HOSPITAL_BASED_OUTPATIENT_CLINIC_OR_DEPARTMENT_OTHER): Payer: Medicare Other | Admitting: Physical Therapy

## 2021-04-16 ENCOUNTER — Telehealth: Payer: Self-pay | Admitting: *Deleted

## 2021-04-16 DIAGNOSIS — G8929 Other chronic pain: Secondary | ICD-10-CM | POA: Diagnosis not present

## 2021-04-16 DIAGNOSIS — M6281 Muscle weakness (generalized): Secondary | ICD-10-CM | POA: Diagnosis not present

## 2021-04-16 DIAGNOSIS — R262 Difficulty in walking, not elsewhere classified: Secondary | ICD-10-CM | POA: Diagnosis not present

## 2021-04-16 DIAGNOSIS — M545 Low back pain, unspecified: Secondary | ICD-10-CM

## 2021-04-16 NOTE — Therapy (Signed)
OUTPATIENT PHYSICAL THERAPY TREATMENT NOTE   Patient Name: Todd Chapman MRN: [id number] DOB:23-May-1945, 76 y.o., male Today's Date: 04/16/2021  PCP: [name], MD    PT End of Session - 04/16/21 1157     Visit Number 7    Number of Visits 18    Date for PT Re-Evaluation 05/30/21    Authorization Type Medicare A&B    Progress Note Due on Visit 10    PT Start Time 1146    PT Stop Time 1224    PT Time Calculation (min) 38 min    Activity Tolerance Patient tolerated treatment well    Behavior During Therapy WFL for tasks assessed/performed                Past Medical History:  Diagnosis Date   Arthritis    "thumbs, back" (02/16/2017)   Bradycardia 02/2017   Dyspnea    with exertion   Flu 2 weeks ago   GERD (gastroesophageal reflux disease)    Hepatitis A as child   HLD (hyperlipidemia)    Persistent atrial fibrillation ([place]) 12/18/2016   Pneumonia 1999; 2018   Presence of permanent cardiac pacemaker    st jude   Prostate cancer [facility])    Past Surgical History:  Procedure Laterality Date   APPENDECTOMY     age 61   CARDIOVERSION  02/16/2017   CARDIOVERSION N/A 02/16/2017   Procedure: CARDIOVERSION;  Surgeon: Thompson Grayer, MD;  Location: [facility];  Service: Cardiovascular;  Laterality: N/A;   COLONOSCOPY WITH PROPOFOL N/A 12/13/2012   Procedure: COLONOSCOPY WITH PROPOFOL;  Surgeon: Garlan Fair, MD;  Location: [facility];  Service: Endoscopy;  Laterality: N/A;   FRACTURE SURGERY     HYDROCELE EXCISION Left 05/16/2018   Procedure: HYDROCELECTOMY ADULT;  Surgeon: [name], MD;  Location: [facility];  Service: Urology;  Laterality: Left;   INSERT / REPLACE / REMOVE PACEMAKER  02/16/2017   KYPHOPLASTY N/A 02/10/2021   Procedure: KYPHOPLASTY L-5;  Surgeon: Newman Pies, MD;  Location: [place];  Service: Neurosurgery;  Laterality: N/A;   LAPAROSCOPY N/A 06/04/2020   Procedure: POSSIBLE LAPAROTOMY POSSIBLE BOWEL  RESSECTION;  Surgeon: Clovis Riley, MD;  Location: [facility];  Service: General;  Laterality: N/A;   PACEMAKER IMPLANT N/A 02/16/2017   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 by Dr Rayann Heman   PROSTATE BIOPSY  11/23/2019   WRIST FRACTURE SURGERY Left    with pins   Patient Active Problem List   Diagnosis Date Noted   Vertebral compression fracture ([place]) 02/12/2021   Bilateral acetabular fractures, closed, initial encounter ([place]) 02/08/2021   Closed compression fracture of L5 lumbar vertebra, initial encounter ([place]) 02/08/2021   Back pain 02/06/2021   SBO (small bowel obstruction) ([place]) 06/01/2020   Pacemaker 05/13/2020   Malignant neoplasm of prostate ([place]) 01/09/2020   Annual physical exam 01/08/2020   Acute upper respiratory infection 01/08/2020   Asymptomatic varicose veins of lower extremity 01/08/2020   Bronchitis 01/08/2020   Cataract 01/08/2020   Constipation 01/08/2020   Diverticulosis of large intestine without perforation or abscess without bleeding 01/08/2020   Screening for gout 01/08/2020   False positive serological test for syphilis 01/08/2020   Gastro-esophageal reflux disease without esophagitis 01/08/2020   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   History of colonic polyps 01/08/2020   Impacted cerumen 01/08/2020   Insomnia 01/08/2020   Osteoarthritis 01/08/2020   Polypharmacy 01/08/2020   Pure hypercholesterolemia 01/08/2020  Rosacea 01/08/2020   Combined forms of age-related cataract of both eyes 07/13/2019   Elevated blood pressure reading 11/08/2018   Sick sinus syndrome ([place]) 02/16/2017   Persistent atrial fibrillation ([place]) 12/18/2016   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   Nonexudative age-related macular degeneration, bilateral, 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   Dermatochalasis of both upper eyelids 07/25/2014   Nuclear sclerosis of both eyes 07/25/2014   Bradycardia 05/29/2014   Premature atrial  contractions 05/29/2014   Premature junctional contractions ([place]) 05/29/2014   Hyperlipidemia 05/29/2014   Encounter for general adult medical examination with abnormal findings 04/24/2014   Vitamin D deficiency 04/24/2014    REFERRING PROVIDER: Barbie Banner, PA-C    REFERRING DIAG: [phone] (ICD-10-CM) - Collapsed vertebra, not elsewhere classified, site unspecified, initial encounter for fracture    THERAPY DIAG:  Chronic low back pain without sciatica, unspecified back pain laterality   Muscle weakness (generalized)   Difficulty in walking, not elsewhere classified   ONSET DATE: Bilateral L5 kyphoplasty and vertebral body biopsies on 02/10/2021   SUBJECTIVE:                                                                                                                                                                                            SUBJECTIVE STATEMENT:   -Pt saw Dr. Lyla Glassing on 03/04/2021 and gave him an order for PT for Pubic Rami stress fractures.  Eval and treat bilt pubic rami stress fx and troch bursitis.   -Pt is 9 weeks and 2 days s/p Bilateral L5 kyphoplasty and vertebral body biopsies by Dr. Earle Gell on 02/10/2021.  Pt states the biopsies came back negative for CA.   -Pt reports compliance with HEP.  Pt states he feels better after he performs his home exercises.  Pt denies any adverse effects after prior Rx, just some soreness in knee. Pt sits anywhere from 2-4 hours at a time at work.  He reports having pain at the end of the day and has difficulty sleeping.  Pt took Tylenol yesterday evening and slept better last night.            PERTINENT HISTORY:  02/10/2021 bilat L5 kyphoplasty,  bilat acetabular stress fractures and pubic rami stress Fx; Grade 1 anterolisthesis at L4-L5; PACEMAKER, A-fib, hx of prostate CA    PAIN:  Are you having pain? Yes NPRS scale: 5/10 current Pain location:  Central lumbar and R sided LBP  Aggravating factors:  standing Relieving factors: meds   PRECAUTIONS: Back--No bending, lifting > 10# , twisting   WEIGHT BEARING RESTRICTIONS No     PATIENT GOALS to  return to the gym; improve functional mobility     OBJECTIVE:    DIAGNOSTIC FINDINGS: (In Epic) -MRI on 02/07/2021 (prior to surgery) which showed Grade 1 anterolisthesis at L4-L5 and L5 inferior endplate compression fracture with less than 50% loss of height. -Earlier MRI showed Moderate osteoarthritis of bilateral hips and bilat acetabular stress/insufficiency fractures.  MRI indicated differential considerations include severe stress reaction possible developing insufficiency fracture versus underlying malignancy secondary to metastatic disease given the patient's history of prostate cancer. -MRI pelvis: 1. Bilateral acetabular/supra-acetabular stress fractures in the pelvis. There is likewise some stress fracture involving the left medial superior pubic ramus. This overall represents a progression compared to the previous unilateral left acetabular stress fracture shown on 11/04/2020.    TODAY'S TREATMENT  Therapeutic Exercise:  -Reviewed response to prior Rx, HEP compliance, current function, and pain level. -Pt performed:  Supine alt UE/LE with TrA 3x10   Supine alt LE ext with TrA 2x10 while holding ball  Supine manual HS stretch 3x20-30 sec bilat.     Standing shoulder extension with GTB with TrA 2x10  Standing scap retraction with GTB with TrA 2x10  Side stepping holding onto wall  -See below for pt education  Neuro Re-ed Activities: Standing on airex with  FT 3x30 sec, FA with EC 3x20 sec Tandem stance 2x20 sec bilat with occasional UE assist      PATIENT EDUCATION:  Education details:  PT answered Pt's questions.  Exercise form.  Instructed pt to not sit for the length of time he is at work.  Instructed him to try to get up at least every 45 mins to 1 hour.   Person educated: Patient  Education method: Explanation,  demonstration, verbal cues, tactile cues Education comprehension: verbalized understanding and returned demonstration, verbal cues and tactile cues required     HOME EXERCISE PROGRAM: Access Code: [email or web address] URL: [email or web address] Date: 04/04/2021 Prepared by: [place]  Exercises Supine Lower Trunk Rotation - 1 x daily - 7 x weekly - 3 sets - 10 reps Standing Glute Med Mobilization with Small Ball on Wall - 1 x daily - 7 x weekly - 3 sets - 10 reps Supine Piriformis Stretch with Foot on Ground - 1 x daily - 7 x weekly - 3 sets - 10 reps Supine March - 1-2 x daily - 7 x weekly - 2 sets - 10 reps Supine Core Control with Heel Slide - 1-2 x daily - 7 x weekly - 1-2 sets - 10 reps Shoulder extension with resistance - Neutral - 1 x daily - 7 x weekly - 2 sets - 10 reps - 3 hold Scapular Retraction with Resistance - 1 x daily - 7 x weekly - 2 sets - 10 reps - 3 hold Seated Transversus Abdominis Bracing - 1 x daily - 7 x weekly - 10 reps - 5-10 seconds hold Side Stepping with Counter Support - 1 x daily - 7 x weekly - 3 sets - 10 reps    ASSESSMENT:   CLINICAL IMPRESSION: Pt is progressing well with function, strength, and tolerance to activity.  He is progressing with core exercises and demonstrates improved form with exercises.  He is compliant with HEP.  Pt has increased pain toward the end of the day and can sit for long periods of time.  PT educated pt with decreasing duration of sitting by standing up and taking a few steps intermittently.  Pt responded well to Rx having no c/o's and no  increased pain  after Rx.  Pt should cont to benefit from cont skilled PT services to address ongoing goals and to restore desired level of function.   Objective impairments include Abnormal gait, decreased activity tolerance, decreased balance, decreased endurance, decreased mobility, difficulty walking, decreased ROM, decreased strength, hypomobility, impaired  flexibility, postural dysfunction, and pain. These impairments are limiting patient from cleaning, community activity, meal prep, occupation, laundry, shopping, and ambulation . Personal factors including 3+ comorbidities:  bilat acetabular stress fractures and pubic rami stress Fx; Pacemaker, A-fib, and hx of prostate CA   are also affecting patient's functional outcome.      REHAB POTENTIAL: Good   CLINICAL DECISION MAKING: Evolving/moderate complexity   EVALUATION COMPLEXITY: Moderate     GOALS:     SHORT TERM GOALS:   STG Name Target Date Goal status  1 Pt will be independent and compliant with HEP for improved pain, strength, and mobility.  Baseline:  03/28/2021 ACHIEVED  2 Pt will demo improved limp with gait without AD Baseline:  04/04/2021 ACHIEVED  3 Pt will progress with walking program without adverse effects.  Baseline: 04/18/2021 ONGOING  4 Pt will be able to perform bed mobility without increased pain or difficulty.  Baseline: 04/11/2021 ONGOING  5 Pt will perform core exercises without adverse effects for improved core strength in order to perform daily activities and functional mobility with reduced stress on lumbar, increased ease, and reduced pain Baseline: 04/04/2021 ONGOING                      LONG TERM GOALS:    LTG Name Target Date Goal status  1 Pt will be able to perform occupational activities without significant pain or difficulty. Baseline: 05/30/2021 INITIAL  2 Pt will be able to perform extended community ambulation without an AD without increased pain or difficulty.  Baseline: 05/30/2021 INITIAL  3 Pt will ambulate without an AD without limping.  Baseline: 05/16/2021 INITIAL  4 Pt will be able to perform IADLs and household chores including his normal standing activities such as preparing food and cooking without significant pain or limitation.  Baseline: 05/30/2021 INITIAL      PLAN: PT FREQUENCY:  1 time per week currently and will transition to 2x/wk  when needed   PT DURATION: other: 10-12 weeks   PLANNED INTERVENTIONS: Therapeutic exercises, Therapeutic activity, Neuro Muscular re-education, Balance training, Gait training, Patient/Family education, Stair training, DME instructions, Aquatic Therapy, Cryotherapy, Taping, and Manual therapy   PLAN FOR NEXT SESSION: NO E-STIM--Pt has a PACEMAKER.   Progress core exercises per kyphoplasty protocol.   Cont per kyphoplasty protocol with regards to stress fractures. Progress HEP as tolerated.      Selinda Michaels III PT, DPT 04/16/21 4:37 PM

## 2021-04-16 NOTE — Telephone Encounter (Signed)
Todd Chapman asked for refills on his oxycontin 15 mg and oxycodone 5 mg. He takes in the evening and night so that he can work during the day and do his therapy.  He has appt on 05/02/21 with Dr Dagoberto Ligas. Per PMP last fill date was 02/28/21.

## 2021-04-17 MED ORDER — OXYCODONE HCL ER 15 MG PO T12A: 15.0000 mg | EXTENDED_RELEASE_TABLET | Freq: Two times a day (BID) | ORAL | 0 refills | Status: DC

## 2021-04-17 MED ORDER — OXYCODONE HCL 5 MG PO TABS: 5.0000 mg | ORAL_TABLET | ORAL | 0 refills | Status: DC | PRN

## 2021-04-17 NOTE — Telephone Encounter (Signed)
Notified of refills.

## 2021-04-21 ENCOUNTER — Other Ambulatory Visit: Payer: Self-pay

## 2021-04-21 ENCOUNTER — Encounter (HOSPITAL_BASED_OUTPATIENT_CLINIC_OR_DEPARTMENT_OTHER): Payer: Self-pay | Admitting: Physical Therapy

## 2021-04-21 ENCOUNTER — Ambulatory Visit (HOSPITAL_BASED_OUTPATIENT_CLINIC_OR_DEPARTMENT_OTHER): Payer: Medicare Other | Admitting: Physical Therapy

## 2021-04-21 DIAGNOSIS — M545 Low back pain, unspecified: Secondary | ICD-10-CM

## 2021-04-21 DIAGNOSIS — M6281 Muscle weakness (generalized): Secondary | ICD-10-CM

## 2021-04-21 DIAGNOSIS — G8929 Other chronic pain: Secondary | ICD-10-CM | POA: Diagnosis not present

## 2021-04-21 DIAGNOSIS — R262 Difficulty in walking, not elsewhere classified: Secondary | ICD-10-CM | POA: Diagnosis not present

## 2021-04-21 NOTE — Therapy (Signed)
OUTPATIENT PHYSICAL THERAPY TREATMENT NOTE   Patient Name: Todd Chapman MRN: [id number] DOB:1945/08/11, 76 y.o., male Today's Date: 04/21/2021  PCP: [name], MD    PT End of Session - 04/21/21 1155     Visit Number 8    Number of Visits 18    Date for PT Re-Evaluation 05/30/21    Authorization Type Medicare A&B    Progress Note Due on Visit 10    PT Start Time 1152    PT Stop Time 1234    PT Time Calculation (min) 42 min    Activity Tolerance Patient tolerated treatment well    Behavior During Therapy WFL for tasks assessed/performed                Past Medical History:  Diagnosis Date   Arthritis    "thumbs, back" (02/16/2017)   Bradycardia 02/2017   Dyspnea    with exertion   Flu 2 weeks ago   GERD (gastroesophageal reflux disease)    Hepatitis A as child   HLD (hyperlipidemia)    Persistent atrial fibrillation ([place]) 12/18/2016   Pneumonia 1999; 2018   Presence of permanent cardiac pacemaker    st jude   Prostate cancer [facility])    Past Surgical History:  Procedure Laterality Date   APPENDECTOMY     age 52   CARDIOVERSION  02/16/2017   CARDIOVERSION N/A 02/16/2017   Procedure: CARDIOVERSION;  Surgeon: Thompson Grayer, MD;  Location: [place] [facility];  Service: Cardiovascular;  Laterality: N/A;   COLONOSCOPY WITH PROPOFOL N/A 12/13/2012   Procedure: COLONOSCOPY WITH PROPOFOL;  Surgeon: Garlan Fair, MD;  Location: [facility];  Service: Endoscopy;  Laterality: N/A;   FRACTURE SURGERY     HYDROCELE EXCISION Left 05/16/2018   Procedure: HYDROCELECTOMY ADULT;  Surgeon: [name], MD;  Location: [facility];  Service: Urology;  Laterality: Left;   INSERT / REPLACE / REMOVE PACEMAKER  02/16/2017   KYPHOPLASTY N/A 02/10/2021   Procedure: KYPHOPLASTY L-5;  Surgeon: Newman Pies, MD;  Location: [place];  Service: Neurosurgery;  Laterality: N/A;   LAPAROSCOPY N/A 06/04/2020   Procedure: POSSIBLE LAPAROTOMY POSSIBLE BOWEL  RESSECTION;  Surgeon: Clovis Riley, MD;  Location: [facility];  Service: General;  Laterality: N/A;   PACEMAKER IMPLANT N/A 02/16/2017   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 by Dr Rayann Heman   PROSTATE BIOPSY  11/23/2019   WRIST FRACTURE SURGERY Left    with pins   Patient Active Problem List   Diagnosis Date Noted   Vertebral compression fracture ([place]) 02/12/2021   Bilateral acetabular fractures, closed, initial encounter ([place]) 02/08/2021   Closed compression fracture of L5 lumbar vertebra, initial encounter ([place]) 02/08/2021   Back pain 02/06/2021   SBO (small bowel obstruction) ([place]) 06/01/2020   Pacemaker 05/13/2020   Malignant neoplasm of prostate ([place]) 01/09/2020   Annual physical exam 01/08/2020   Acute upper respiratory infection 01/08/2020   Asymptomatic varicose veins of lower extremity 01/08/2020   Bronchitis 01/08/2020   Cataract 01/08/2020   Constipation 01/08/2020   Diverticulosis of large intestine without perforation or abscess without bleeding 01/08/2020   Screening for gout 01/08/2020   False positive serological test for syphilis 01/08/2020   Gastro-esophageal reflux disease without esophagitis 01/08/2020   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   History of colonic polyps 01/08/2020   Impacted cerumen 01/08/2020   Insomnia 01/08/2020   Osteoarthritis 01/08/2020   Polypharmacy 01/08/2020   Pure hypercholesterolemia 01/08/2020  Rosacea 01/08/2020   Combined forms of age-related cataract of both eyes 07/13/2019   Elevated blood pressure reading 11/08/2018   Sick sinus syndrome ([place]) 02/16/2017   Persistent atrial fibrillation ([place]) 12/18/2016   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   Nonexudative age-related macular degeneration, bilateral, 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   Dermatochalasis of both upper eyelids 07/25/2014   Nuclear sclerosis of both eyes 07/25/2014   Bradycardia 05/29/2014   Premature atrial  contractions 05/29/2014   Premature junctional contractions ([place]) 05/29/2014   Hyperlipidemia 05/29/2014   Encounter for general adult medical examination with abnormal findings 04/24/2014   Vitamin D deficiency 04/24/2014    REFERRING PROVIDER: Barbie Banner, PA-C    REFERRING DIAG: [phone] (ICD-10-CM) - Collapsed vertebra, not elsewhere classified, site unspecified, initial encounter for fracture    THERAPY DIAG:  Chronic low back pain without sciatica, unspecified back pain laterality   Muscle weakness (generalized)   Difficulty in walking, not elsewhere classified   ONSET DATE: Bilateral L5 kyphoplasty and vertebral body biopsies on 02/10/2021   SUBJECTIVE:                                                                                                                                                                                            SUBJECTIVE STATEMENT:   -Pt saw Dr. Lyla Glassing on 03/04/2021 and gave him an order for PT for Pubic Rami stress fractures.  Eval and treat bilt pubic rami stress fx and troch bursitis.   -Pt is 10 weeks s/p Bilateral L5 kyphoplasty and vertebral body biopsies by Dr. Earle Gell on 02/10/2021.  Pt states the biopsies came back negative for CA.   -Pt reports compliance with HEP.  Pt states he feels better after he performs his home exercises.  Pt denies any adverse effects after prior Rx. Pt sits anywhere from 2-4 hours at a time at work.  He reports having pain at the end of the day and has difficulty sleeping.  Pt took Tylenol yesterday evening and slept better last night.      Pt states he has less pain overall.  Pt reports he was able to stand at a function for 5-10 mins without having to sit down which is an improvement.  Pt states he stood to cook for the superbowl yesterday and had to lean on the counter and sit multiple times while cooking.  Pt reports he has weakness in back and LEs.  Pt states he tried to perform q-ped birddogs at home.         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 Yes NPRS scale: 4/10 current Pain location:  R sided LBP  Aggravating factors: standing Relieving factors: meds   PRECAUTIONS: Back--No bending, lifting > 10# , twisting   WEIGHT BEARING RESTRICTIONS No     PATIENT GOALS to return to the gym; improve functional mobility     OBJECTIVE:    DIAGNOSTIC FINDINGS: (In Epic) -MRI on 02/07/2021 (prior to surgery) which showed Grade 1 anterolisthesis at L4-L5 and L5 inferior endplate compression fracture with less than 50% loss of height. -Earlier MRI showed Moderate osteoarthritis of bilateral hips and bilat acetabular stress/insufficiency fractures.  MRI indicated differential considerations include severe stress reaction possible developing insufficiency fracture versus underlying malignancy secondary to metastatic disease given the patient's history of prostate cancer. -MRI pelvis: 1. Bilateral acetabular/supra-acetabular stress fractures in the pelvis. There is likewise some stress fracture involving the left medial superior pubic ramus. This overall represents a progression compared to the previous unilateral left acetabular stress fracture shown on 11/04/2020.    TODAY'S TREATMENT  Therapeutic Exercise:  -Reviewed response to prior Rx, HEP compliance, current function, and pain level. -Pt performed:  Supine alt UE/LE with TrA 3x10   Supine alt LE ext with TrA 2x10 while holding 2#  Supine manual HS stretch 3x20-30 sec bilat.     Standing shoulder extension with GTB with TrA 3x10  Standing scap retraction with GTB with TrA 3x10  Side stepping holding onto wall  Pt performed step ups on 4 inch step with TrA x 7 reps on R and x10 reps on L PT observed how pt was trying to perform birddogs at home and instructed him  performing alt UE raises.  He performed LEs thru a limited ROM.  Q-ped alt UE raises approx 10-12 reps  -See below for pt education  Neuro Re-ed Activities: Standing on airex with  FT 1x1 min, FA with EC 3x20 sec Tandem stance 2x20 sec bilat with occasional UE assist Marching on airex 2x10 reps      PATIENT EDUCATION:  Education details:  PT answered Pt's questions.  Exercise form.  Instructed pt in performing q ped alt UE raises instead of birddogs due to his form and weakness.  Reviewed HEP. Person educated: Patient  Education method: Explanation, demonstration, verbal cues, tactile cues Education comprehension: verbalized understanding and returned demonstration, verbal cues and tactile cues required     HOME EXERCISE PROGRAM: Access Code: [email or web address] URL: [email or web address] Date: 04/04/2021 Prepared by: [place]  Exercises Supine Lower Trunk Rotation - 1 x daily - 7 x weekly - 3 sets - 10 reps Standing Glute Med Mobilization with Small Ball on Wall - 1 x daily - 7 x weekly - 3 sets - 10 reps Supine Piriformis Stretch with Foot on Ground - 1 x daily - 7 x weekly - 3 sets - 10 reps Supine March - 1-2 x daily - 7 x weekly - 2 sets - 10 reps Supine Core Control with Heel Slide - 1-2 x daily - 7 x weekly - 1-2 sets - 10 reps Shoulder extension with resistance - Neutral - 1 x daily - 7 x weekly - 2 sets - 10 reps - 3 hold Scapular Retraction with Resistance - 1 x daily - 7 x weekly - 2 sets - 10 reps - 3 hold Seated Transversus Abdominis Bracing - 1 x daily - 7  x weekly - 10 reps - 5-10 seconds hold Side Stepping with Counter Support - 1 x daily - 7 x weekly - 3 sets - 10 reps    ASSESSMENT:   CLINICAL IMPRESSION: Pt is improving with standing duration including functional standing tolerance though continues to be limited.  Pt reports less pain overall.  Pt has tried to perform birddogs at home and PT observed pt today.  Pt has core  weakness and is unable to perform correctly.  PT instructed pt in just performing alt UE raises with TrA and Pt agreed.  Pt performed exercises well with cuing and instruction in correct form.  PT had pt stop step ups on R LE due to R knee pain.  Pt reports having a prior R knee injury. Pt had no hip/pelvis pain, lumbar, or L LE  pain with step ups.  Pt responded well to Rx reporting improved pain to 2-3/10 after Rx.  Pt should cont to benefit from cont skilled PT services to address ongoing goals and to restore desired level of function.   Objective impairments include Abnormal gait, decreased activity tolerance, decreased balance, decreased endurance, decreased mobility, difficulty walking, decreased ROM, decreased strength, hypomobility, impaired flexibility, postural dysfunction, and pain. These impairments are limiting patient from cleaning, community activity, meal prep, occupation, laundry, shopping, and ambulation . Personal factors including 3+ comorbidities:  bilat acetabular stress fractures and pubic rami stress Fx; Pacemaker, A-fib, and hx of prostate CA   are also affecting patient's functional outcome.      REHAB POTENTIAL: Good   CLINICAL DECISION MAKING: Evolving/moderate complexity   EVALUATION COMPLEXITY: Moderate     GOALS:     SHORT TERM GOALS:   STG Name Target Date Goal status  1 Pt will be independent and compliant with HEP for improved pain, strength, and mobility.  Baseline:  03/28/2021 ACHIEVED  2 Pt will demo improved limp with gait without AD Baseline:  04/04/2021 ACHIEVED  3 Pt will progress with walking program without adverse effects.  Baseline: 04/18/2021 ONGOING  4 Pt will be able to perform bed mobility without increased pain or difficulty.  Baseline: 04/11/2021 ONGOING  5 Pt will perform core exercises without adverse effects for improved core strength in order to perform daily activities and functional mobility with reduced stress on lumbar, increased ease,  and reduced pain Baseline: 04/04/2021 ONGOING                      LONG TERM GOALS:    LTG Name Target Date Goal status  1 Pt will be able to perform occupational activities without significant pain or difficulty. Baseline: 05/30/2021 INITIAL  2 Pt will be able to perform extended community ambulation without an AD without increased pain or difficulty.  Baseline: 05/30/2021 INITIAL  3 Pt will ambulate without an AD without limping.  Baseline: 05/16/2021 INITIAL  4 Pt will be able to perform IADLs and household chores including his normal standing activities such as preparing food and cooking without significant pain or limitation.  Baseline: 05/30/2021 INITIAL      PLAN:   PLANNED INTERVENTIONS: Therapeutic exercises, Therapeutic activity, Neuro Muscular re-education, Balance training, Gait training, Patient/Family education, Stair training, DME instructions, Aquatic Therapy, Cryotherapy, Taping, and Manual therapy   PLAN FOR NEXT SESSION: NO E-STIM--Pt has a PACEMAKER.   Progress core exercises per kyphoplasty protocol.   Cont per kyphoplasty protocol with regards to stress fractures. Progress HEP as tolerated.      Jasha Hodzic  Aline Brochure III PT, DPT 04/21/21 4:08 PM

## 2021-04-23 ENCOUNTER — Other Ambulatory Visit: Payer: Self-pay

## 2021-04-23 ENCOUNTER — Encounter (HOSPITAL_BASED_OUTPATIENT_CLINIC_OR_DEPARTMENT_OTHER): Payer: Self-pay | Admitting: Physical Therapy

## 2021-04-23 ENCOUNTER — Ambulatory Visit (HOSPITAL_BASED_OUTPATIENT_CLINIC_OR_DEPARTMENT_OTHER): Payer: Medicare Other | Admitting: Physical Therapy

## 2021-04-23 DIAGNOSIS — R262 Difficulty in walking, not elsewhere classified: Secondary | ICD-10-CM | POA: Diagnosis not present

## 2021-04-23 DIAGNOSIS — M545 Low back pain, unspecified: Secondary | ICD-10-CM | POA: Diagnosis not present

## 2021-04-23 DIAGNOSIS — M6281 Muscle weakness (generalized): Secondary | ICD-10-CM | POA: Diagnosis not present

## 2021-04-23 DIAGNOSIS — G8929 Other chronic pain: Secondary | ICD-10-CM | POA: Diagnosis not present

## 2021-04-23 NOTE — Therapy (Signed)
OUTPATIENT PHYSICAL THERAPY TREATMENT NOTE   Patient Name: Todd Chapman MRN: [id number] DOB:1945/03/24, 76 y.o., male Today's Date: 04/23/2021  PCP: [name], MD    PT End of Session - 04/23/21 1159     Visit Number 9    Number of Visits 18    Date for PT Re-Evaluation 05/30/21    Authorization Type Medicare A&B    Progress Note Due on Visit 10    PT Start Time 1152    PT Stop Time 1234    PT Time Calculation (min) 42 min    Activity Tolerance Patient tolerated treatment well    Behavior During Therapy WFL for tasks assessed/performed                 Past Medical History:  Diagnosis Date   Arthritis    "thumbs, back" (02/16/2017)   Bradycardia 02/2017   Dyspnea    with exertion   Flu 2 weeks ago   GERD (gastroesophageal reflux disease)    Hepatitis A as child   HLD (hyperlipidemia)    Persistent atrial fibrillation ([place]) 12/18/2016   Pneumonia 1999; 2018   Presence of permanent cardiac pacemaker    st jude   Prostate cancer [facility])    Past Surgical History:  Procedure Laterality Date   APPENDECTOMY     age 51   CARDIOVERSION  02/16/2017   CARDIOVERSION N/A 02/16/2017   Procedure: CARDIOVERSION;  Surgeon: Thompson Grayer, MD;  Location: [place] [facility];  Service: Cardiovascular;  Laterality: N/A;   COLONOSCOPY WITH PROPOFOL N/A 12/13/2012   Procedure: COLONOSCOPY WITH PROPOFOL;  Surgeon: Garlan Fair, MD;  Location: [facility];  Service: Endoscopy;  Laterality: N/A;   FRACTURE SURGERY     HYDROCELE EXCISION Left 05/16/2018   Procedure: HYDROCELECTOMY ADULT;  Surgeon: [name], MD;  Location: [facility];  Service: Urology;  Laterality: Left;   INSERT / REPLACE / REMOVE PACEMAKER  02/16/2017   KYPHOPLASTY N/A 02/10/2021   Procedure: KYPHOPLASTY L-5;  Surgeon: Newman Pies, MD;  Location: [place];  Service: Neurosurgery;  Laterality: N/A;   LAPAROSCOPY N/A 06/04/2020   Procedure: POSSIBLE LAPAROTOMY POSSIBLE BOWEL  RESSECTION;  Surgeon: Clovis Riley, MD;  Location: [facility];  Service: General;  Laterality: N/A;   PACEMAKER IMPLANT N/A 02/16/2017   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 by Dr Rayann Heman   PROSTATE BIOPSY  11/23/2019   WRIST FRACTURE SURGERY Left    with pins   Patient Active Problem List   Diagnosis Date Noted   Vertebral compression fracture ([place]) 02/12/2021   Bilateral acetabular fractures, closed, initial encounter ([place]) 02/08/2021   Closed compression fracture of L5 lumbar vertebra, initial encounter ([place]) 02/08/2021   Back pain 02/06/2021   SBO (small bowel obstruction) ([place]) 06/01/2020   Pacemaker 05/13/2020   Malignant neoplasm of prostate ([place]) 01/09/2020   Annual physical exam 01/08/2020   Acute upper respiratory infection 01/08/2020   Asymptomatic varicose veins of lower extremity 01/08/2020   Bronchitis 01/08/2020   Cataract 01/08/2020   Constipation 01/08/2020   Diverticulosis of large intestine without perforation or abscess without bleeding 01/08/2020   Screening for gout 01/08/2020   False positive serological test for syphilis 01/08/2020   Gastro-esophageal reflux disease without esophagitis 01/08/2020   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   History of colonic polyps 01/08/2020   Impacted cerumen 01/08/2020   Insomnia 01/08/2020   Osteoarthritis 01/08/2020   Polypharmacy 01/08/2020   Pure hypercholesterolemia 01/08/2020  Rosacea 01/08/2020   Combined forms of age-related cataract of both eyes 07/13/2019   Elevated blood pressure reading 11/08/2018   Sick sinus syndrome ([place]) 02/16/2017   Persistent atrial fibrillation ([place]) 12/18/2016   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   Nonexudative age-related macular degeneration, bilateral, 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   Dermatochalasis of both upper eyelids 07/25/2014   Nuclear sclerosis of both eyes 07/25/2014   Bradycardia 05/29/2014   Premature atrial  contractions 05/29/2014   Premature junctional contractions ([place]) 05/29/2014   Hyperlipidemia 05/29/2014   Encounter for general adult medical examination with abnormal findings 04/24/2014   Vitamin D deficiency 04/24/2014    REFERRING PROVIDER: Barbie Banner, PA-C    REFERRING DIAG: [phone] (ICD-10-CM) - Collapsed vertebra, not elsewhere classified, site unspecified, initial encounter for fracture    THERAPY DIAG:  Chronic low back pain without sciatica, unspecified back pain laterality   Muscle weakness (generalized)   Difficulty in walking, not elsewhere classified   ONSET DATE: Bilateral L5 kyphoplasty and vertebral body biopsies on 02/10/2021   SUBJECTIVE:                                                                                                                                                                                            SUBJECTIVE STATEMENT:   -Pt saw Dr. Lyla Glassing on 03/04/2021 and gave him an order for PT for Pubic Rami stress fractures.  Eval and treat bilt pubic rami stress fx and troch bursitis.   -Pt is 10 weeks and 2 days s/p Bilateral L5 kyphoplasty and vertebral body biopsies by Dr. Earle Gell on 02/10/2021.  Pt states the biopsies came back negative for CA.   -Pt reports compliance with HEP.  Pt states he feels better after he performs his home exercises.  Pt denies any adverse effects after prior Rx. Pt sits anywhere from 2-4 hours at a time at work.  He reports having pain at the end of the day and has difficulty sleeping if he has been sitting more at work.        Pt states he has less pain overall.  Pt is able to ambulate 1/2 mile currently.  Pt reports he was able to stand at a function for 5-10 mins without having to sit down which is an improvement.  Pt states he stood to cook for the superbowl and had to lean on the counter and sit multiple times while cooking.  Pt reports he has weakness in back and LEs.  Pt states he tried to perform q-ped  birddogs at home.  Pt reports he has 3-4/10 pain currently.  He  woke up with 4-5/10 and felt better after his stretches.  Pt states he is not using oxycontin, but is using oxycodone quick release. Pt states his surgeon is allowing him to drive and he has driven short distances.         PERTINENT HISTORY:  02/10/2021 bilat L5 kyphoplasty,  bilat acetabular stress fractures and pubic rami stress Fx; Grade 1 anterolisthesis at L4-L5; PACEMAKER, A-fib, hx of prostate CA    PAIN:  Are you having pain? Yes NPRS scale: 3-4/10 current Pain location:  R sided LBP  Aggravating factors: standing Relieving factors:  tylenol, oxycodone   PRECAUTIONS: Back--No bending, lifting > 10# , twisting   WEIGHT BEARING RESTRICTIONS No     PATIENT GOALS to return to the gym; improve functional mobility     OBJECTIVE:    DIAGNOSTIC FINDINGS: (In Epic) -MRI on 02/07/2021 (prior to surgery) which showed Grade 1 anterolisthesis at L4-L5 and L5 inferior endplate compression fracture with less than 50% loss of height. -Earlier MRI showed Moderate osteoarthritis of bilateral hips and bilat acetabular stress/insufficiency fractures.  MRI indicated differential considerations include severe stress reaction possible developing insufficiency fracture versus underlying malignancy secondary to metastatic disease given the patient's history of prostate cancer. -MRI pelvis: 1. Bilateral acetabular/supra-acetabular stress fractures in the pelvis. There is likewise some stress fracture involving the left medial superior pubic ramus. This overall represents a progression compared to the previous unilateral left acetabular stress fracture shown on 11/04/2020.    TODAY'S TREATMENT  Therapeutic Exercise:  -Reviewed response to prior Rx, HEP compliance, current function, and pain level. -Pt performed:  Supine alt UE/LE with TrA 3x10   Supine alt LE ext with TrA 3x10 while holding 2#  Supine manual HS stretch 3x20-30 sec bilat.    Supine bridge 2 x 10 reps with education to not perform too high.    Standing shoulder extension with GTB with TrA 3x10  Standing scap retraction with GTB with TrA 3x10  Side stepping without UE support on wall  Pt performed step ups on 4 inch step with TrA x 7 reps on R and x10 reps on L  Q-ped alt UE raises approx 10-12 reps   NuStep L5 (legs only) x 5 mins  -See below for pt education  Neuro Re-ed Activities: Standing on airex with FA with EC 3x20 sec Tandem stance 2x20 sec bilat with occasional UE assist Marching on airex 3 x 10 reps      PATIENT EDUCATION:  Education details:  PT answered Pt's questions.  Exercise form.  Instructed pt in performing q ped alt UE raises instead of birddogs due to his form and weakness.  Reviewed HEP. Person educated: Patient  Education method: Explanation, demonstration, verbal cues, tactile cues Education comprehension: verbalized understanding and returned demonstration, verbal cues and tactile cues required     HOME EXERCISE PROGRAM: Access Code: [email or web address] URL: [email or web address] Date: 04/04/2021 Prepared by: [place]  Exercises Supine Lower Trunk Rotation - 1 x daily - 7 x weekly - 3 sets - 10 reps Standing Glute Med Mobilization with Small Ball on Wall - 1 x daily - 7 x weekly - 3 sets - 10 reps Supine Piriformis Stretch with Foot on Ground - 1 x daily - 7 x weekly - 3 sets - 10 reps Supine March - 1-2 x daily - 7 x weekly - 2 sets - 10 reps Supine Core Control with Heel Slide - 1-2 x daily - 7 x weekly -  1-2 sets - 10 reps Shoulder extension with resistance - Neutral - 1 x daily - 7 x weekly - 2 sets - 10 reps - 3 hold Scapular Retraction with Resistance - 1 x daily - 7 x weekly - 2 sets - 10 reps - 3 hold Seated Transversus Abdominis Bracing - 1 x daily - 7 x weekly - 10 reps - 5-10 seconds hold Side Stepping with Counter Support - 1 x daily - 7 x weekly - 3 sets - 10 reps     ASSESSMENT:   CLINICAL IMPRESSION: Pt is progressing well with pain, sx's, and function as evidenced by subjective reports.  He is limited with mobility and IADLs though is improving.  He is limited with tolerance to activity though is improving.  Pt is progressing with exercises per protocol and is improving with core strength as evidenced by performance of exercises.  Pt responded well to Rx reporting improved pain from 3-4/10 before Rx to 0/10 after Rx though does have some soreness in lumbar.   Pt should cont to benefit from cont skilled PT services to address ongoing goals and to restore desired level of function.   Objective impairments include Abnormal gait, decreased activity tolerance, decreased balance, decreased endurance, decreased mobility, difficulty walking, decreased ROM, decreased strength, hypomobility, impaired flexibility, postural dysfunction, and pain. These impairments are limiting patient from cleaning, community activity, meal prep, occupation, laundry, shopping, and ambulation . Personal factors including 3+ comorbidities:  bilat acetabular stress fractures and pubic rami stress Fx; Pacemaker, A-fib, and hx of prostate CA   are also affecting patient's functional outcome.      REHAB POTENTIAL: Good   CLINICAL DECISION MAKING: Evolving/moderate complexity   EVALUATION COMPLEXITY: Moderate     GOALS:     SHORT TERM GOALS:   STG Name Target Date Goal status  1 Pt will be independent and compliant with HEP for improved pain, strength, and mobility.  Baseline:  03/28/2021 ACHIEVED  2 Pt will demo improved limp with gait without AD Baseline:  04/04/2021 ACHIEVED  3 Pt will progress with walking program without adverse effects.  Baseline: 04/18/2021 ONGOING  4 Pt will be able to perform bed mobility without increased pain or difficulty.  Baseline: 04/11/2021 ONGOING  5 Pt will perform core exercises without adverse effects for improved core strength in order to perform  daily activities and functional mobility with reduced stress on lumbar, increased ease, and reduced pain Baseline: 04/04/2021 ONGOING                      LONG TERM GOALS:    LTG Name Target Date Goal status  1 Pt will be able to perform occupational activities without significant pain or difficulty. Baseline: 05/30/2021 INITIAL  2 Pt will be able to perform extended community ambulation without an AD without increased pain or difficulty.  Baseline: 05/30/2021 INITIAL  3 Pt will ambulate without an AD without limping.  Baseline: 05/16/2021 INITIAL  4 Pt will be able to perform IADLs and household chores including his normal standing activities such as preparing food and cooking without significant pain or limitation.  Baseline: 05/30/2021 INITIAL      PLAN:   PLANNED INTERVENTIONS: Therapeutic exercises, Therapeutic activity, Neuro Muscular re-education, Balance training, Gait training, Patient/Family education, Stair training, DME instructions, Aquatic Therapy, Cryotherapy, Taping, and Manual therapy   PLAN FOR NEXT SESSION: NO E-STIM--Pt has a PACEMAKER.   Progress core exercises per kyphoplasty protocol.   Cont per kyphoplasty  protocol with regards to stress fractures. Progress HEP as tolerated. PN next visit.     Selinda Michaels III PT, DPT 04/23/21 12:42 PM

## 2021-04-24 DIAGNOSIS — Z0000 Encounter for general adult medical examination without abnormal findings: Secondary | ICD-10-CM | POA: Diagnosis not present

## 2021-04-24 DIAGNOSIS — Z1211 Encounter for screening for malignant neoplasm of colon: Secondary | ICD-10-CM | POA: Diagnosis not present

## 2021-04-24 DIAGNOSIS — M461 Sacroiliitis, not elsewhere classified: Secondary | ICD-10-CM | POA: Diagnosis not present

## 2021-04-24 DIAGNOSIS — K219 Gastro-esophageal reflux disease without esophagitis: Secondary | ICD-10-CM | POA: Diagnosis not present

## 2021-04-24 DIAGNOSIS — K317 Polyp of stomach and duodenum: Secondary | ICD-10-CM | POA: Diagnosis not present

## 2021-04-24 DIAGNOSIS — Z23 Encounter for immunization: Secondary | ICD-10-CM | POA: Diagnosis not present

## 2021-04-24 DIAGNOSIS — K5730 Diverticulosis of large intestine without perforation or abscess without bleeding: Secondary | ICD-10-CM | POA: Diagnosis not present

## 2021-04-24 DIAGNOSIS — D6869 Other thrombophilia: Secondary | ICD-10-CM | POA: Diagnosis not present

## 2021-04-24 DIAGNOSIS — C61 Malignant neoplasm of prostate: Secondary | ICD-10-CM | POA: Diagnosis not present

## 2021-04-24 DIAGNOSIS — E559 Vitamin D deficiency, unspecified: Secondary | ICD-10-CM | POA: Diagnosis not present

## 2021-04-24 DIAGNOSIS — Z79899 Other long term (current) drug therapy: Secondary | ICD-10-CM | POA: Diagnosis not present

## 2021-04-24 DIAGNOSIS — Z950 Presence of cardiac pacemaker: Secondary | ICD-10-CM | POA: Diagnosis not present

## 2021-04-24 DIAGNOSIS — I4891 Unspecified atrial fibrillation: Secondary | ICD-10-CM | POA: Diagnosis not present

## 2021-04-24 DIAGNOSIS — D123 Benign neoplasm of transverse colon: Secondary | ICD-10-CM | POA: Diagnosis not present

## 2021-04-24 DIAGNOSIS — Z1389 Encounter for screening for other disorder: Secondary | ICD-10-CM | POA: Diagnosis not present

## 2021-04-28 ENCOUNTER — Encounter (HOSPITAL_BASED_OUTPATIENT_CLINIC_OR_DEPARTMENT_OTHER): Payer: Medicare Other | Admitting: Physical Therapy

## 2021-04-29 ENCOUNTER — Encounter (HOSPITAL_BASED_OUTPATIENT_CLINIC_OR_DEPARTMENT_OTHER): Payer: Self-pay | Admitting: Physical Therapy

## 2021-04-29 ENCOUNTER — Ambulatory Visit (HOSPITAL_BASED_OUTPATIENT_CLINIC_OR_DEPARTMENT_OTHER): Payer: Medicare Other | Admitting: Physical Therapy

## 2021-04-29 ENCOUNTER — Other Ambulatory Visit: Payer: Self-pay

## 2021-04-29 DIAGNOSIS — M545 Low back pain, unspecified: Secondary | ICD-10-CM | POA: Diagnosis not present

## 2021-04-29 DIAGNOSIS — M6281 Muscle weakness (generalized): Secondary | ICD-10-CM

## 2021-04-29 DIAGNOSIS — G8929 Other chronic pain: Secondary | ICD-10-CM

## 2021-04-29 DIAGNOSIS — R262 Difficulty in walking, not elsewhere classified: Secondary | ICD-10-CM

## 2021-04-29 NOTE — Therapy (Signed)
OUTPATIENT PHYSICAL THERAPY TREATMENT NOTE Progress Note Reporting Period 03/07/21 to 04/29/21  See note below for Objective Data and Assessment of Progress/Goals.       Patient Name: Todd Chapman MRN: [id number] DOB:1945-06-02, 76 y.o., male Today's Date: 04/30/2021  PCP: [name], MD    PT End of Session - 04/29/21 1515     Visit Number 10    Number of Visits 18    Date for PT Re-Evaluation 05/30/21    Authorization Type Medicare A&B    Progress Note Due on Visit 64    PT Start Time 1515    PT Stop Time 1600    PT Time Calculation (min) 45 min    Activity Tolerance Patient tolerated treatment well    Behavior During Therapy [facility] for tasks assessed/performed                 Past Medical History:  Diagnosis Date   Arthritis    "thumbs, back" (02/16/2017)   Bradycardia 02/2017   Dyspnea    with exertion   Flu 2 weeks ago   GERD (gastroesophageal reflux disease)    Hepatitis A as child   HLD (hyperlipidemia)    Persistent atrial fibrillation ([place]) 12/18/2016   Pneumonia 1999; 2018   Presence of permanent cardiac pacemaker    st jude   Prostate cancer [facility])    Past Surgical History:  Procedure Laterality Date   APPENDECTOMY     age 36   CARDIOVERSION  02/16/2017   CARDIOVERSION N/A 02/16/2017   Procedure: CARDIOVERSION;  Surgeon: Thompson Grayer, MD;  Location: [place] [facility];  Service: Cardiovascular;  Laterality: N/A;   COLONOSCOPY WITH PROPOFOL N/A 12/13/2012   Procedure: COLONOSCOPY WITH PROPOFOL;  Surgeon: Garlan Fair, MD;  [facility];  Service: Endoscopy;  Laterality: N/A;   FRACTURE SURGERY     HYDROCELE EXCISION Left 05/16/2018   Procedure: HYDROCELECTOMY ADULT;  Surgeon: [name], MD;  Location: [facility];  Service: Urology;  Laterality: Left;   INSERT / REPLACE / REMOVE PACEMAKER  02/16/2017   KYPHOPLASTY N/A 02/10/2021   Procedure: KYPHOPLASTY L-5;  Surgeon: Newman Pies, MD;  Location:  [place];  Service: Neurosurgery;  Laterality: N/A;   LAPAROSCOPY N/A 06/04/2020   Procedure: POSSIBLE LAPAROTOMY POSSIBLE BOWEL RESSECTION;  Surgeon: Clovis Riley, MD;  [facility];  Service: General;  Laterality: N/A;   PACEMAKER IMPLANT N/A 02/16/2017   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 by Dr Rayann Heman   PROSTATE BIOPSY  11/23/2019   WRIST FRACTURE SURGERY Left    with pins   Patient Active Problem List   Diagnosis Date Noted   Vertebral compression fracture ([place]) 02/12/2021   Bilateral acetabular fractures, closed, initial encounter ([place]) 02/08/2021   Closed compression fracture of L5 lumbar vertebra, initial encounter ([place]) 02/08/2021   Back pain 02/06/2021   SBO (small bowel obstruction) ([place]) 06/01/2020   Pacemaker 05/13/2020   Malignant neoplasm of prostate ([place]) 01/09/2020   Annual physical exam 01/08/2020   Acute upper respiratory infection 01/08/2020   Asymptomatic varicose veins of lower extremity 01/08/2020   Bronchitis 01/08/2020   Cataract 01/08/2020   Constipation 01/08/2020   Diverticulosis of large intestine without perforation or abscess without bleeding 01/08/2020   Screening for gout 01/08/2020   False positive serological test for syphilis 01/08/2020   Gastro-esophageal reflux disease without esophagitis 01/08/2020   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   History of colonic polyps 01/08/2020  Impacted cerumen 01/08/2020   Insomnia 01/08/2020   Osteoarthritis 01/08/2020   Polypharmacy 01/08/2020   Pure hypercholesterolemia 01/08/2020   Rosacea 01/08/2020   Combined forms of age-related cataract of both eyes 07/13/2019   Elevated blood pressure reading 11/08/2018   Sick sinus syndrome ([place]) 02/16/2017   Persistent atrial fibrillation ([place]) 12/18/2016   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   Nonexudative age-related macular degeneration, bilateral, 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    Dermatochalasis of both upper eyelids 07/25/2014   Nuclear sclerosis of both eyes 07/25/2014   Bradycardia 05/29/2014   Premature atrial contractions 05/29/2014   Premature junctional contractions ([place]) 05/29/2014   Hyperlipidemia 05/29/2014   Encounter for general adult medical examination with abnormal findings 04/24/2014   Vitamin D deficiency 04/24/2014    REFERRING PROVIDER: Barbie Banner, PA-C    REFERRING DIAG: [phone] (ICD-10-CM) - Collapsed vertebra, not elsewhere classified, site unspecified, initial encounter for fracture    THERAPY DIAG:  Chronic low back pain without sciatica, unspecified back pain laterality   Muscle weakness (generalized)   Difficulty in walking, not elsewhere classified   ONSET DATE: Bilateral L5 kyphoplasty and vertebral body biopsies on 02/10/2021   SUBJECTIVE:                                                                                                                                                                                            SUBJECTIVE STATEMENT:   I do my printouts every day, sometimes BID so I am always sore. I feel like I have made progress in my strength each day. Strength improvements in muscles around L5 but I do have about 7/10 pain around Right SIJ- because I walked 4 days last week, about 1/4 mile each time. I sat for about 5 hours at work today and it hurts badly every afternoon.        PERTINENT HISTORY:  02/10/2021 bilat L5 kyphoplasty,  bilat acetabular stress fractures and pubic rami stress Fx; Grade 1 anterolisthesis at L4-L5; PACEMAKER, A-fib, hx of prostate CA    PAIN:  Are you having pain? Yes NPRS scale: 7/10 current Pain location:  R sided LBP-SIJ and lateral to it Aggravating factors: walking Relieving factors:  stretching   PRECAUTIONS: Back--No bending, lifting > 10# , twisting   WEIGHT BEARING RESTRICTIONS No     PATIENT GOALS to return to the gym; improve functional mobility      OBJECTIVE:    DIAGNOSTIC FINDINGS: (In Epic) -MRI on 02/07/2021 (prior to surgery) which showed Grade 1 anterolisthesis at L4-L5 and L5 inferior endplate compression fracture with less than 50% loss of height. -Earlier  MRI showed Moderate osteoarthritis of bilateral hips and bilat acetabular stress/insufficiency fractures.  MRI indicated differential considerations include severe stress reaction possible developing insufficiency fracture versus underlying malignancy secondary to metastatic disease given the patient's history of prostate cancer. -MRI pelvis: 1. Bilateral acetabular/supra-acetabular stress fractures in the pelvis. There is likewise some stress fracture involving the left medial superior pubic ramus. This overall represents a progression compared to the previous unilateral left acetabular stress fracture shown on 11/04/2020.   PATIENT SURVEYS:  FOTO 41 on 2/21 with a goal of 58     COGNITION:          Overall cognitive status: Within functional limits for tasks assessed                        SENSATION:          Light touch: Appears intact   OBSERVATION:           Small incisions are intact without any signs of infection. Dry and healing well.  Pt not wearing brace today.   POSTURE:  Pt feels better sitting in chair with back supported.       LUMBAR AROM AND LE STRENGTH:  Not Tested due to noted LLD & pelvic rotation        GAIT: No AD noted today, mild antalgic pattern on Right   TODAY'S TREATMENT  Reviewed all goals, HEP and POC for progress note visit Heel lift inserted into shoe & analyzed gait and alignment      PATIENT EDUCATION:  Education details:  anatomy of condition, POC, HEP, exercise form/rationale Person educated: Patient  Education method: Explanation, demonstration, verbal cues, tactile cues Education comprehension: verbalized understanding and returned demonstration, verbal cues and tactile cues required     HOME EXERCISE PROGRAM: Access  Code: [email or web address] URL: [email or web address]    ASSESSMENT:   CLINICAL IMPRESSION: 2-layer heel lift in Left shoe made him feel like he was not limping during gait. Overall he is making great progress toward his goals and requires further progression to continue until meeting them.   Objective impairments include Abnormal gait, decreased activity tolerance, decreased balance, decreased endurance, decreased mobility, difficulty walking, decreased ROM, decreased strength, hypomobility, impaired flexibility, postural dysfunction, and pain. These impairments are limiting patient from cleaning, community activity, meal prep, occupation, laundry, shopping, and ambulation . Personal factors including 3+ comorbidities:  bilat acetabular stress fractures and pubic rami stress Fx; Pacemaker, A-fib, and hx of prostate CA   are also affecting patient's functional outcome.      REHAB POTENTIAL: Good   CLINICAL DECISION MAKING: Evolving/moderate complexity   EVALUATION COMPLEXITY: Moderate     GOALS:     SHORT TERM GOALS:   STG Name Target Date Goal status  1 Pt will be independent and compliant with HEP for improved pain, strength, and mobility.  Baseline:  03/28/2021 ACHIEVED  2 Pt will demo improved limp with gait without AD Baseline:  04/04/2021 ACHIEVED  3 Pt will progress with walking program without adverse effects.  Baseline: 2/21 walking 1/4 miles hurts back 04/18/2021 ONGOING  4 Pt will be able to perform bed mobility without increased pain or difficulty.  Baseline: 04/11/2021 Achieved  5 Pt will perform core exercises without adverse effects for improved core strength in order to perform daily activities and functional mobility with reduced stress on lumbar, increased ease, and reduced pain Baseline: 04/04/2021 Achieved  LONG TERM GOALS:    LTG Name Target Date Goal status  1 Pt will be able to perform occupational activities without significant pain or  difficulty. Baseline: 05/30/2021 INITIAL  2 Pt will be able to perform extended community ambulation without an AD without increased pain or difficulty.  Baseline: 05/30/2021 INITIAL  3 Pt will ambulate without an AD without limping.  Baseline: 05/16/2021 INITIAL  4 Pt will be able to perform IADLs and household chores including his normal standing activities such as preparing food and cooking without significant pain or limitation.  Baseline: 05/30/2021 INITIAL      PLAN:   PLANNED INTERVENTIONS: Therapeutic exercises, Therapeutic activity, Neuro Muscular re-education, Balance training, Gait training, Patient/Family education, Stair training, DME instructions, Aquatic Therapy, Cryotherapy, Taping, and Manual therapy   PLAN FOR NEXT SESSION: NO E-STIM--Pt has a PACEMAKER.   Progress core exercises per kyphoplasty protocol.   Cont per kyphoplasty protocol with regards to stress fractures. Progress HEP as tolerated    Jaqua Ching C. Dhana Totton PT, DPT 04/30/21 8:13 PM

## 2021-04-30 ENCOUNTER — Ambulatory Visit (HOSPITAL_BASED_OUTPATIENT_CLINIC_OR_DEPARTMENT_OTHER): Payer: Medicare Other | Admitting: Physical Therapy

## 2021-04-30 DIAGNOSIS — M6281 Muscle weakness (generalized): Secondary | ICD-10-CM | POA: Diagnosis not present

## 2021-04-30 DIAGNOSIS — M545 Low back pain, unspecified: Secondary | ICD-10-CM

## 2021-04-30 DIAGNOSIS — R262 Difficulty in walking, not elsewhere classified: Secondary | ICD-10-CM

## 2021-04-30 DIAGNOSIS — G8929 Other chronic pain: Secondary | ICD-10-CM | POA: Diagnosis not present

## 2021-04-30 NOTE — Therapy (Signed)
OUTPATIENT PHYSICAL THERAPY TREATMENT NOTE   Patient Name: Todd Chapman MRN: [id number] DOB:[birth year], 76 y.o., male Today's Date: 05/01/2021  PCP: [name], MD    PT End of Session - 04/30/21 1314     Visit Number 11    Number of Visits 18    Date for PT Re-Evaluation 05/30/21    Authorization Type Medicare A&B    Progress Note Due on Visit 20    PT Start Time 1157    PT Stop Time 1236    PT Time Calculation (min) 39 min    Activity Tolerance Patient tolerated treatment well    Behavior During Therapy WFL for tasks assessed/performed                  Past Medical History:  Diagnosis Date   Arthritis    "thumbs, back" (02/16/2017)   Bradycardia 02/2017   Dyspnea    with exertion   Flu 2 weeks ago   GERD (gastroesophageal reflux disease)    Hepatitis A as child   HLD (hyperlipidemia)    Persistent atrial fibrillation ([place]) 12/18/2016   Pneumonia 1999; 2018   Presence of permanent cardiac pacemaker    st jude   Prostate cancer [facility])    Past Surgical History:  Procedure Laterality Date   APPENDECTOMY     age 75   CARDIOVERSION  02/16/2017   CARDIOVERSION N/A 02/16/2017   Procedure: CARDIOVERSION;  Surgeon: Thompson Grayer, MD;  Location: [place] [facility];  Service: Cardiovascular;  Laterality: N/A;   COLONOSCOPY WITH PROPOFOL N/A 12/13/2012   Procedure: COLONOSCOPY WITH PROPOFOL;  Surgeon: Garlan Fair, MD;  Location: [facility];  Service: Endoscopy;  Laterality: N/A;   FRACTURE SURGERY     HYDROCELE EXCISION Left 05/16/2018   Procedure: HYDROCELECTOMY ADULT;  Surgeon: [name], MD;  Location: [facility];  Service: Urology;  Laterality: Left;   INSERT / REPLACE / REMOVE PACEMAKER  02/16/2017   KYPHOPLASTY N/A 02/10/2021   Procedure: KYPHOPLASTY L-5;  Surgeon: Newman Pies, MD;  Location: [place];  Service: Neurosurgery;  Laterality: N/A;   LAPAROSCOPY N/A 06/04/2020   Procedure: POSSIBLE LAPAROTOMY POSSIBLE BOWEL  RESSECTION;  Surgeon: Clovis Riley, MD;  Location: [facility];  Service: General;  Laterality: N/A;   PACEMAKER IMPLANT N/A 02/16/2017   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 by Dr Rayann Heman   PROSTATE BIOPSY  11/23/2019   WRIST FRACTURE SURGERY Left    with pins   Patient Active Problem List   Diagnosis Date Noted   Vertebral compression fracture ([place]) 02/12/2021   Bilateral acetabular fractures, closed, initial encounter ([place]) 02/08/2021   Closed compression fracture of L5 lumbar vertebra, initial encounter ([place]) 02/08/2021   Back pain 02/06/2021   SBO (small bowel obstruction) ([place]) 06/01/2020   Pacemaker 05/13/2020   Malignant neoplasm of prostate ([place]) 01/09/2020   Annual physical exam 01/08/2020   Acute upper respiratory infection 01/08/2020   Asymptomatic varicose veins of lower extremity 01/08/2020   Bronchitis 01/08/2020   Cataract 01/08/2020   Constipation 01/08/2020   Diverticulosis of large intestine without perforation or abscess without bleeding 01/08/2020   Screening for gout 01/08/2020   False positive serological test for syphilis 01/08/2020   Gastro-esophageal reflux disease without esophagitis 01/08/2020   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   History of colonic polyps 01/08/2020   Impacted cerumen 01/08/2020   Insomnia 01/08/2020   Osteoarthritis 01/08/2020   Polypharmacy 01/08/2020   Pure hypercholesterolemia  01/08/2020   Rosacea 01/08/2020   Combined forms of age-related cataract of both eyes 07/13/2019   Elevated blood pressure reading 11/08/2018   Sick sinus syndrome ([place]) 02/16/2017   Persistent atrial fibrillation ([place]) 12/18/2016   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   Nonexudative age-related macular degeneration, bilateral, 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   Dermatochalasis of both upper eyelids 07/25/2014   Nuclear sclerosis of both eyes 07/25/2014   Bradycardia 05/29/2014   Premature atrial  contractions 05/29/2014   Premature junctional contractions ([place]) 05/29/2014   Hyperlipidemia 05/29/2014   Encounter for general adult medical examination with abnormal findings 04/24/2014   Vitamin D deficiency 04/24/2014    REFERRING PROVIDER: Barbie Banner, PA-C    REFERRING DIAG: [phone] (ICD-10-CM) - Collapsed vertebra, not elsewhere classified, site unspecified, initial encounter for fracture    THERAPY DIAG:  Chronic low back pain without sciatica, unspecified back pain laterality   Muscle weakness (generalized)   Difficulty in walking, not elsewhere classified   ONSET DATE: Bilateral L5 kyphoplasty and vertebral body biopsies on 02/10/2021   SUBJECTIVE:                                                                                                                                                                                            SUBJECTIVE STATEMENT:   -Pt saw Dr. Lyla Glassing on 03/04/2021 and gave him an order for PT for Pubic Rami stress fractures.  Eval and treat bilt pubic rami stress fx and troch bursitis.   -Pt is s/p Bilateral L5 kyphoplasty and vertebral body biopsies by Dr. Earle Gell on 02/10/2021.  Pt states the biopsies came back negative for CA.    -Pt reports compliance with HEP.  Pt states he feels better after he performs his home exercises.  Pt denies any adverse effects after prior Rx. Pt sits anywhere from 2-4 hours at a time at work and has not been getting up at work frequently.  He is going to try today.  He reports having pain at the end of the day and has difficulty sleeping if he has been sitting more at work.        -Pt states he has less pain overall.  Pt reports he has weakness in back and LEs.  Pt states he is not using oxycontin, but is using oxycodone quick release. Pt states his surgeon is allowing him to drive and he has driven short distances.    -Pt states he had an evaluation last PT session, his 10th visit.  Pt reports he received  a shoe lift to put in his left foot.  He feels  that his gait is better with improved limp using the heel lift.  He did have increased pain the following AM which is today.  He reports having a sharp pain in the R SI area.  He felt fine after Rx though did have increased pain the following AM which is today.  Pt reports he is having increased pain.         PERTINENT HISTORY:  02/10/2021 bilat L5 kyphoplasty,  bilat acetabular stress fractures and pubic rami stress Fx; Grade 1 anterolisthesis at L4-L5; PACEMAKER, A-fib, hx of prostate CA    PAIN:  Are you having pain? Yes NPRS scale: 7/10 current Pain location:  R SI to center of lumbar Aggravating factors: standing Relieving factors:  tylenol, oxycodone   PRECAUTIONS: Back--No bending, lifting > 10# , twisting   WEIGHT BEARING RESTRICTIONS No     PATIENT GOALS to return to the gym; improve functional mobility     OBJECTIVE:    DIAGNOSTIC FINDINGS: (In Epic) -MRI on 02/07/2021 (prior to surgery) which showed Grade 1 anterolisthesis at L4-L5 and L5 inferior endplate compression fracture with less than 50% loss of height. -Earlier MRI showed Moderate osteoarthritis of bilateral hips and bilat acetabular stress/insufficiency fractures.  MRI indicated differential considerations include severe stress reaction possible developing insufficiency fracture versus underlying malignancy secondary to metastatic disease given the patient's history of prostate cancer. -MRI pelvis: 1. Bilateral acetabular/supra-acetabular stress fractures in the pelvis. There is likewise some stress fracture involving the left medial superior pubic ramus. This overall represents a progression compared to the previous unilateral left acetabular stress fracture shown on 11/04/2020.    TODAY'S TREATMENT  Therapeutic Exercise:  -Reviewed response to prior Rx, HEP compliance, current function, and pain level. -Pt performed:  Supine alt UE/LE with TrA 3x10   Supine alt LE  ext with TrA 3x10 while holding 2#  Supine manual HS stretch 3x20-30 sec bilat.   Supine bridge 2 x 10 reps with education to not perform too high.    Pt performed step ups on 4 inch step with TrA x 7 reps on R and x10 reps on L  Q-ped alt UE raises approx 2x10 reps   NuStep L5 (legs only) x 6 mins  -See below for pt education  Neuro Re-ed Activities: Standing on airex with FA with EC 3x20 sec Tandem stance 2x20 sec bilat with occasional UE assist Marching on airex 3 x 10 reps      PATIENT EDUCATION:  Education details:  Educated pt with progressively increasing duration of heel lift.  PT answered Pt's questions.  Exercise form.   Education method: Explanation, demonstration, verbal cues, tactile cues Education comprehension: verbalized understanding and returned demonstration, verbal cues and tactile cues required     HOME EXERCISE PROGRAM: Access Code: [email or web address] URL: [email or web address] Date: 04/04/2021 Prepared by: [place]  Exercises Supine Lower Trunk Rotation - 1 x daily - 7 x weekly - 3 sets - 10 reps Standing Glute Med Mobilization with Small Ball on Wall - 1 x daily - 7 x weekly - 3 sets - 10 reps Supine Piriformis Stretch with Foot on Ground - 1 x daily - 7 x weekly - 3 sets - 10 reps Supine March - 1-2 x daily - 7 x weekly - 2 sets - 10 reps Supine Core Control with Heel Slide - 1-2 x daily - 7 x weekly - 1-2 sets - 10 reps Shoulder extension with resistance - Neutral - 1 x daily -  7 x weekly - 2 sets - 10 reps - 3 hold Scapular Retraction with Resistance - 1 x daily - 7 x weekly - 2 sets - 10 reps - 3 hold Seated Transversus Abdominis Bracing - 1 x daily - 7 x weekly - 10 reps - 5-10 seconds hold Side Stepping with Counter Support - 1 x daily - 7 x weekly - 3 sets - 10 reps    ASSESSMENT:   CLINICAL IMPRESSION: Pt is progressing well with pain, sx's, and function.  He is limited with mobility, IADLs, and tolerance to  activity though is improving.  He continues to sit for long periods at work and is not getting up frequently.  Pt is progressing with exercises per protocol and is improving with core strength as evidenced by performance of exercises.  Pt responded well to Rx reporting improved pain from 7/10 before Rx to 5/10 after Rx.  Pt should cont to benefit from cont skilled PT services to address ongoing goals and to restore desired level of function.   Objective impairments include Abnormal gait, decreased activity tolerance, decreased balance, decreased endurance, decreased mobility, difficulty walking, decreased ROM, decreased strength, hypomobility, impaired flexibility, postural dysfunction, and pain. These impairments are limiting patient from cleaning, community activity, meal prep, occupation, laundry, shopping, and ambulation . Personal factors including 3+ comorbidities:  bilat acetabular stress fractures and pubic rami stress Fx; Pacemaker, A-fib, and hx of prostate CA   are also affecting patient's functional outcome.      REHAB POTENTIAL: Good   CLINICAL DECISION MAKING: Evolving/moderate complexity   EVALUATION COMPLEXITY: Moderate     GOALS:     SHORT TERM GOALS:   STG Name Target Date Goal status  1 Pt will be independent and compliant with HEP for improved pain, strength, and mobility.  Baseline:  03/28/2021 ACHIEVED  2 Pt will demo improved limp with gait without AD Baseline:  04/04/2021 ACHIEVED  3 Pt will progress with walking program without adverse effects.  Baseline: 04/18/2021 ONGOING  4 Pt will be able to perform bed mobility without increased pain or difficulty.  Baseline: 04/11/2021 ONGOING  5 Pt will perform core exercises without adverse effects for improved core strength in order to perform daily activities and functional mobility with reduced stress on lumbar, increased ease, and reduced pain Baseline: 04/04/2021 ONGOING                      LONG TERM GOALS:    LTG  Name Target Date Goal status  1 Pt will be able to perform occupational activities without significant pain or difficulty. Baseline: 05/30/2021 INITIAL  2 Pt will be able to perform extended community ambulation without an AD without increased pain or difficulty.  Baseline: 05/30/2021 INITIAL  3 Pt will ambulate without an AD without limping.  Baseline: 05/16/2021 INITIAL  4 Pt will be able to perform IADLs and household chores including his normal standing activities such as preparing food and cooking without significant pain or limitation.  Baseline: 05/30/2021 INITIAL      PLAN:   PLANNED INTERVENTIONS: Therapeutic exercises, Therapeutic activity, Neuro Muscular re-education, Balance training, Gait training, Patient/Family education, Stair training, DME instructions, Aquatic Therapy, Cryotherapy, Taping, and Manual therapy   PLAN FOR NEXT SESSION: NO E-STIM--Pt has a PACEMAKER.   Progress core exercises per kyphoplasty protocol.   Cont per kyphoplasty protocol with regards to stress fractures. Progress HEP as tolerated.      Selinda Michaels III PT, DPT 05/01/21  11:49 AM

## 2021-05-01 ENCOUNTER — Encounter (HOSPITAL_BASED_OUTPATIENT_CLINIC_OR_DEPARTMENT_OTHER): Payer: Self-pay | Admitting: Physical Therapy

## 2021-05-02 ENCOUNTER — Other Ambulatory Visit: Payer: Self-pay

## 2021-05-02 ENCOUNTER — Encounter: Payer: Self-pay | Admitting: Physical Medicine and Rehabilitation

## 2021-05-02 ENCOUNTER — Encounter
Payer: Medicare Other | Attending: Physical Medicine and Rehabilitation | Admitting: Physical Medicine and Rehabilitation

## 2021-05-02 VITALS — BP 123/73 | HR 69 | Ht 74.0 in | Wt 202.0 lb

## 2021-05-02 DIAGNOSIS — S32050G Wedge compression fracture of fifth lumbar vertebra, subsequent encounter for fracture with delayed healing: Secondary | ICD-10-CM | POA: Diagnosis not present

## 2021-05-02 DIAGNOSIS — M5416 Radiculopathy, lumbar region: Secondary | ICD-10-CM | POA: Diagnosis not present

## 2021-05-02 NOTE — Progress Notes (Signed)
Subjective:    Patient ID: Todd Chapman, male    DOB: [birth year], 76 y.o.   MRN: [id number]  HPI Pt is a 76 yr old male with hx of chronic back pain with L5 vertebral body compression fx s/p kyphoplasty, hx of prostate CA- no mets; cancer related fatigue, HTN; hyponatremia in hospital;  Here for hospital f/u. 02/19/21- d/c  Doing pretty well.  Slow healer per pt/NSU.  No using any assistive device and doing stairs.    Went back to work- can drive and go to [employer].  Contributing member of household.  By end of day, is really tired and having pain still.   Can walk 20 minutes before work in AM and 20 minutes in evening-    Pain management- using tylenol- 650 mg 4x/day- q6 hours. Occ will sneak one more in.  If pain wakes him up, will take oxycodone in middle of night or occ around 5-6pm will take oxycodone.  Doesn't take frequently- max 2x/day. No opiates when working.  Nagging pain- moving slow still.  Muscles in places didn't know about.   Does PT- at [place]- 2x/week. Didn't get in til 1 month after d/c.  Does HEP daily.   No constipation- takes miralax- stopped senna- 1/2 capful and changed diet.   L leg shorter than R leg- testing shoe inserts and stopped limping.   Also burning pain in band across low back.  .   Pain Inventory Average Pain 6 Pain Right Now 6 My pain is constant, burning, and aching  In the last 24 hours, has pain interfered with the following? General activity 5 Relation with others 3 Enjoyment of life 4 What TIME of day is your pain at its worst? evening and night Sleep (in general) Fair  Pain is worse with: sitting, inactivity, and some activites Pain improves with: rest, therapy/exercise, pacing activities, and medication Relief from Meds: 9  walk without assistance how many minutes can you walk? 25 ability to climb steps?  yes do you drive?  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  weakness trouble  walking  Hospital f/u  Hospital f/u    Family History  Problem Relation Age of Onset   CVA Mother    Coronary artery disease Mother    Stroke Mother    Coronary artery disease Father    Heart attack Father    Stomach cancer Maternal Grandmother    Breast cancer Neg Hx    Prostate cancer Neg Hx    Colon cancer Neg Hx    Pancreatic cancer Neg Hx    Social History   Socioeconomic History   Marital status: Married    Spouse name: Not on file   Number of children: 2   Years of education: Not on file   Highest education level: Not on file  Occupational History   Not on file  Tobacco Use   Smoking status: Former    Packs/day: 0.12    Years: 8.00    Pack years: 0.96    Types: Cigarettes    Quit date: 11/24/1965    Years since quitting: 55.4   Smokeless tobacco: Never  Vaping Use   Vaping Use: Never used  Substance and Sexual Activity   Alcohol use: Yes    Comment: 02/16/2017 "might have 1 drink/month"   Drug use: No   Sexual activity: Yes  Other Topics Concern   Not on file  Social History Narrative  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 Strain: Not on file  Food Insecurity: Not on file  Transportation Needs: Not on file  Physical Activity: Not on file  Stress: Not on file  Social Connections: Not on file   Past Surgical History:  Procedure Laterality Date   APPENDECTOMY     age 29   CARDIOVERSION  02/16/2017   CARDIOVERSION N/A 02/16/2017   Procedure: CARDIOVERSION;  Surgeon: Thompson Grayer, MD;  Location: [facility];  Service: Cardiovascular;  Laterality: N/A;   COLONOSCOPY WITH PROPOFOL N/A 12/13/2012   Procedure: COLONOSCOPY WITH PROPOFOL;  Surgeon: Garlan Fair, MD;  [facility];  Service: Endoscopy;  Laterality: N/A;   FRACTURE SURGERY     HYDROCELE EXCISION Left 05/16/2018   Procedure: HYDROCELECTOMY ADULT;  Surgeon: [name], MD;  Location: [facility];  Service: Urology;   Laterality: Left;   INSERT / REPLACE / REMOVE PACEMAKER  02/16/2017   KYPHOPLASTY N/A 02/10/2021   Procedure: KYPHOPLASTY L-5;  Surgeon: Newman Pies, MD;  Location: [place];  Service: Neurosurgery;  Laterality: N/A;   LAPAROSCOPY N/A 06/04/2020   Procedure: POSSIBLE LAPAROTOMY POSSIBLE BOWEL RESSECTION;  Surgeon: Clovis Riley, MD;  [facility];  Service: General;  Laterality: N/A;   PACEMAKER IMPLANT N/A 02/16/2017   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 by Dr Rayann Heman   PROSTATE BIOPSY  11/23/2019   WRIST FRACTURE SURGERY Left    with pins   Past Medical History:  Diagnosis Date   Arthritis    "thumbs, back" (02/16/2017)   Bradycardia 02/2017   Dyspnea    with exertion   Flu 2 weeks ago   GERD (gastroesophageal reflux disease)    Hepatitis A as child   HLD (hyperlipidemia)    Persistent atrial fibrillation ([place]) 12/18/2016   Pneumonia 1999; 2018   Presence of permanent cardiac pacemaker    st jude   Prostate cancer ([facility])    BP 123/73    Pulse 69    Ht 6\' 2"  (1.88 m)    Wt 202 lb (91.6 kg)    SpO2 95%    BMI 25.94 kg/m   Opioid Risk Score:   Fall Risk Score:  `1  Depression screen PHQ 2/9  Depression screen PHQ 2/9 05/02/2021  Decreased Interest 0  Down, Depressed, Hopeless 0  PHQ - 2 Score 0  Altered sleeping 3  Tired, decreased energy 2  Change in appetite 0  Feeling bad or failure about yourself  0  Trouble concentrating 0  Moving slowly or fidgety/restless 0  Suicidal thoughts 0  PHQ-9 Score 5  Difficult doing work/chores Somewhat difficult     Review of Systems  Musculoskeletal:  Positive for back pain and gait problem.  Neurological:  Positive for weakness.  All other systems reviewed and are negative.     Objective:   Physical Exam  Awake, alert, appropriate, no assistive device, NAD MS: RLE- HF/KE/KF 5-/5; DF 4/5; PF 5-/5 LLE- 5-/5 throughout  TTP across low back in band of  pain at L5 TTP esp at R lumbar spine  Extremities: 2+ LE edema- wearing TED hose B/L  Pitting edema          Assessment & Plan:   Pt is a 76 yr old male with hx of chronic back pain with L5 vertebral body compression fx s/p kyphoplasty, hx of prostate CA- no mets; cancer related  fatigue, HTN; hyponatremia in hospital;  Here for hospital f/u on kyphoplasty.  Still getting hormonal therapy and having night sweats.   Continue home exercise program at least 5 days/week for life.   2.  3-6 months- so March to June is when likely will be able to return to gym. Ask PT for more specific guidelines.   3. Get back to pool-  can get back now.   4. Can change next pain meds to Hydrocodone- if needs another refill any time soon.    5.  Bringing weight down- at 202- was 185- in [employer]    6.  F/U in 3 months See how things going

## 2021-05-02 NOTE — Patient Instructions (Signed)
Pt is a 76 yr old male with hx of chronic back pain with L5 vertebral body compression fx s/p kyphoplasty, hx of prostate CA- no mets; cancer related fatigue, HTN; hyponatremia in hospital;  Here for hospital f/u on kyphoplasty.  Still getting hormonal therapy and having night sweats.   Continue home exercise program at least 5 days/week for life.   2.  3-6 months- so March to June is when likely will be able to return to gym. Ask PT for more specific guidelines.   3. Get back to pool-  can get back now.   4. Can change next pain meds to Hydrocodone- if needs another refill any time soon.    5.  Bringing weight down- at 202- was 185- in [employer]    6.  F/U in 3 months See how things going

## 2021-05-05 ENCOUNTER — Ambulatory Visit (HOSPITAL_BASED_OUTPATIENT_CLINIC_OR_DEPARTMENT_OTHER): Payer: Medicare Other | Admitting: Physical Therapy

## 2021-05-05 ENCOUNTER — Encounter (HOSPITAL_BASED_OUTPATIENT_CLINIC_OR_DEPARTMENT_OTHER): Payer: Self-pay | Admitting: Physical Therapy

## 2021-05-05 ENCOUNTER — Other Ambulatory Visit: Payer: Self-pay

## 2021-05-05 DIAGNOSIS — M545 Low back pain, unspecified: Secondary | ICD-10-CM | POA: Diagnosis not present

## 2021-05-05 DIAGNOSIS — M6281 Muscle weakness (generalized): Secondary | ICD-10-CM | POA: Diagnosis not present

## 2021-05-05 DIAGNOSIS — R262 Difficulty in walking, not elsewhere classified: Secondary | ICD-10-CM | POA: Diagnosis not present

## 2021-05-05 DIAGNOSIS — G8929 Other chronic pain: Secondary | ICD-10-CM | POA: Diagnosis not present

## 2021-05-05 NOTE — Therapy (Signed)
OUTPATIENT PHYSICAL THERAPY TREATMENT NOTE   Patient Name: Todd Chapman MRN: [id number] DOB:[birth year], 76 y.o., male Today's Date: 05/05/2021  PCP: [name], MD    PT End of Session - 05/05/21 1153     Visit Number 12    Number of Visits 18    Date for PT Re-Evaluation 05/30/21    Authorization Type Medicare A&B    Progress Note Due on Visit 20    PT Start Time 1148    PT Stop Time 1230    PT Time Calculation (min) 42 min    Activity Tolerance Patient tolerated treatment well    Behavior During Therapy WFL for tasks assessed/performed                  Past Medical History:  Diagnosis Date   Arthritis    "thumbs, back" (02/16/2017)   Bradycardia 02/2017   Dyspnea    with exertion   Flu 2 weeks ago   GERD (gastroesophageal reflux disease)    Hepatitis A as child   HLD (hyperlipidemia)    Persistent atrial fibrillation ([place]) 12/18/2016   Pneumonia 1999; 2018   Presence of permanent cardiac pacemaker    st jude   Prostate cancer [facility])    Past Surgical History:  Procedure Laterality Date   APPENDECTOMY     age 67   CARDIOVERSION  02/16/2017   CARDIOVERSION N/A 02/16/2017   Procedure: CARDIOVERSION;  Surgeon: Thompson Grayer, MD;  Location: [place] [facility];  Service: Cardiovascular;  Laterality: N/A;   COLONOSCOPY WITH PROPOFOL N/A 12/13/2012   Procedure: COLONOSCOPY WITH PROPOFOL;  Surgeon: Garlan Fair, MD;  [facility];  Service: Endoscopy;  Laterality: N/A;   FRACTURE SURGERY     HYDROCELE EXCISION Left 05/16/2018   Procedure: HYDROCELECTOMY ADULT;  Surgeon: [name], MD;  Location: [facility];  Service: Urology;  Laterality: Left;   INSERT / REPLACE / REMOVE PACEMAKER  02/16/2017   KYPHOPLASTY N/A 02/10/2021   Procedure: KYPHOPLASTY L-5;  Surgeon: Newman Pies, MD;  Location: [place];  Service: Neurosurgery;  Laterality: N/A;   LAPAROSCOPY N/A 06/04/2020   Procedure: POSSIBLE LAPAROTOMY POSSIBLE BOWEL  RESSECTION;  Surgeon: Clovis Riley, MD;  [facility];  Service: General;  Laterality: N/A;   PACEMAKER IMPLANT N/A 02/16/2017   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 by Dr Rayann Heman   PROSTATE BIOPSY  11/23/2019   WRIST FRACTURE SURGERY Left    with pins   Patient Active Problem List   Diagnosis Date Noted   Lumbar radiculopathy 05/02/2021   Vertebral compression fracture ([place]) 02/12/2021   Bilateral acetabular fractures, closed, initial encounter ([place]) 02/08/2021   Closed compression fracture of L5 lumbar vertebra, initial encounter ([place]) 02/08/2021   Back pain 02/06/2021   SBO (small bowel obstruction) ([place]) 06/01/2020   Pacemaker 05/13/2020   Malignant neoplasm of prostate ([place]) 01/09/2020   Annual physical exam 01/08/2020   Acute upper respiratory infection 01/08/2020   Asymptomatic varicose veins of lower extremity 01/08/2020   Bronchitis 01/08/2020   Cataract 01/08/2020   Constipation 01/08/2020   Diverticulosis of large intestine without perforation or abscess without bleeding 01/08/2020   Screening for gout 01/08/2020   False positive serological test for syphilis 01/08/2020   Gastro-esophageal reflux disease without esophagitis 01/08/2020   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   History of colonic polyps 01/08/2020   Impacted cerumen 01/08/2020   Insomnia 01/08/2020   Osteoarthritis 01/08/2020   Polypharmacy  01/08/2020   Pure hypercholesterolemia 01/08/2020   Rosacea 01/08/2020   Combined forms of age-related cataract of both eyes 07/13/2019   Elevated blood pressure reading 11/08/2018   Sick sinus syndrome ([place]) 02/16/2017   Persistent atrial fibrillation ([place]) 12/18/2016   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   Nonexudative age-related macular degeneration, bilateral, 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   Dermatochalasis of both upper eyelids 07/25/2014   Nuclear sclerosis of both eyes 07/25/2014    Bradycardia 05/29/2014   Premature atrial contractions 05/29/2014   Premature junctional contractions ([place]) 05/29/2014   Hyperlipidemia 05/29/2014   Encounter for general adult medical examination with abnormal findings 04/24/2014   Vitamin D deficiency 04/24/2014    REFERRING PROVIDER: Barbie Banner, PA-C    REFERRING DIAG: [phone] (ICD-10-CM) - Collapsed vertebra, not elsewhere classified, site unspecified, initial encounter for fracture    THERAPY DIAG:  Chronic low back pain without sciatica, unspecified back pain laterality   Muscle weakness (generalized)   Difficulty in walking, not elsewhere classified   ONSET DATE: Bilateral L5 kyphoplasty and vertebral body biopsies on 02/10/2021   SUBJECTIVE:                                                                                                                                                                                            SUBJECTIVE STATEMENT:  2/27: I have some soreness around the SIJ. I felt a little less stable with the lift in Felt more ache after the walk. When I did the side stepping on the bridge it made me sore.    -Pt saw Dr. Lyla Glassing on 03/04/2021 and gave him an order for PT for Pubic Rami stress fractures.  Eval and treat bilt pubic rami stress fx and troch bursitis.   -Pt is s/p Bilateral L5 kyphoplasty and vertebral body biopsies by Dr. Earle Gell on 02/10/2021.  Pt states the biopsies came back negative for CA.    -Pt reports compliance with HEP.  Pt states he feels better after he performs his home exercises.  Pt denies any adverse effects after prior Rx. Pt sits anywhere from 2-4 hours at a time at work and has not been getting up at work frequently.  He is going to try today.  He reports having pain at the end of the day and has difficulty sleeping if he has been sitting more at work.        -Pt states he has less pain overall.  Pt reports he has weakness in back and LEs.  Pt states he is not using  oxycontin, but is using oxycodone quick release. Pt states his  surgeon is allowing him to drive and he has driven short distances.    -Pt states he had an evaluation last PT session, his 10th visit.  Pt reports he received a shoe lift to put in his left foot.  He feels that his gait is better with improved limp using the heel lift.  He did have increased pain the following AM which is today.  He reports having a sharp pain in the R SI area.  He felt fine after Rx though did have increased pain the following AM which is today.  Pt reports he is having increased pain.         PERTINENT HISTORY:  02/10/2021 bilat L5 kyphoplasty,  bilat acetabular stress fractures and pubic rami stress Fx; Grade 1 anterolisthesis at L4-L5; PACEMAKER, A-fib, hx of prostate CA    PAIN:  Are you having pain? Yes NPRS scale: 7/10 current Pain location:  R SI to center of lumbar Aggravating factors: standing Relieving factors:  tylenol, oxycodone   PRECAUTIONS: Back--No bending, lifting > 10# , twisting   WEIGHT BEARING RESTRICTIONS No     PATIENT GOALS to return to the gym; improve functional mobility     OBJECTIVE:    DIAGNOSTIC FINDINGS: (In Epic) -MRI on 02/07/2021 (prior to surgery) which showed Grade 1 anterolisthesis at L4-L5 and L5 inferior endplate compression fracture with less than 50% loss of height. -Earlier MRI showed Moderate osteoarthritis of bilateral hips and bilat acetabular stress/insufficiency fractures.  MRI indicated differential considerations include severe stress reaction possible developing insufficiency fracture versus underlying malignancy secondary to metastatic disease given the patient's history of prostate cancer. -MRI pelvis: 1. Bilateral acetabular/supra-acetabular stress fractures in the pelvis. There is likewise some stress fracture involving the left medial superior pubic ramus. This overall represents a progression compared to the previous unilateral left acetabular stress  fracture shown on 11/04/2020.    TODAY'S TREATMENT  2/27:  MANUAL- STM & trigger point pressure release to Rt glut med/min, TFL, Rt thoraco lumbar paraspinals; bil SIJ mobilizations  Seated resting posture/core contraction  Seated figure 4  Seated iso abd/add using UEs       PATIENT EDUCATION:  Education details:  anatomy of condition- SIJ & surgical region   Education method: Explanation, demonstration, verbal cues, tactile cues Education comprehension: verbalized understanding and returned demonstration, verbal cues and tactile cues required     HOME EXERCISE PROGRAM: Access Code: [email or web address] URL: [email or web address]     ASSESSMENT:   CLINICAL IMPRESSION:  Reduced concordant pain with soft tissue work in [place] hip region. Pt reports he was doing good morning exercise that placed strain on lower back and we discussed the importance of core strength. Will be sitting through hearings over zoom and provided exercises that he can do while seated.   Objective impairments include Abnormal gait, decreased activity tolerance, decreased balance, decreased endurance, decreased mobility, difficulty walking, decreased ROM, decreased strength, hypomobility, impaired flexibility, postural dysfunction, and pain. These impairments are limiting patient from cleaning, community activity, meal prep, occupation, laundry, shopping, and ambulation . Personal factors including 3+ comorbidities:  bilat acetabular stress fractures and pubic rami stress Fx; Pacemaker, A-fib, and hx of prostate CA   are also affecting patient's functional outcome.      REHAB POTENTIAL: Good   CLINICAL DECISION MAKING: Evolving/moderate complexity   EVALUATION COMPLEXITY: Moderate     GOALS:     SHORT TERM GOALS:   STG Name Target Date Goal status  1 Pt will be independent and compliant  with HEP for improved pain, strength, and mobility.  Baseline:  03/28/2021 ACHIEVED  2 Pt will demo improved limp with  gait without AD Baseline:  04/04/2021 ACHIEVED  3 Pt will progress with walking program without adverse effects.  Baseline: 04/18/2021 ONGOING  4 Pt will be able to perform bed mobility without increased pain or difficulty.  Baseline: 04/11/2021 ACHIEVED  5 Pt will perform core exercises without adverse effects for improved core strength in order to perform daily activities and functional mobility with reduced stress on lumbar, increased ease, and reduced pain Baseline: 04/04/2021 ONGOING                      LONG TERM GOALS:    LTG Name Target Date Goal status  1 Pt will be able to perform occupational activities without significant pain or difficulty. Baseline: 05/30/2021 INITIAL  2 Pt will be able to perform extended community ambulation without an AD without increased pain or difficulty.  Baseline: 05/30/2021 INITIAL  3 Pt will ambulate without an AD without limping.  Baseline: 05/16/2021 INITIAL  4 Pt will be able to perform IADLs and household chores including his normal standing activities such as preparing food and cooking without significant pain or limitation.  Baseline: 05/30/2021 INITIAL      PLAN:   PLANNED INTERVENTIONS: Therapeutic exercises, Therapeutic activity, Neuro Muscular re-education, Balance training, Gait training, Patient/Family education, Stair training, DME instructions, Aquatic Therapy, Cryotherapy, Taping, and Manual therapy   PLAN FOR NEXT SESSION: NO E-STIM--Pt has a PACEMAKER.   Progress core exercises per kyphoplasty protocol.   Evaluate outcome of STM   Carliyah Cotterman C. Vanya Carberry PT, DPT 05/05/21 12:47 PM

## 2021-05-07 ENCOUNTER — Encounter (HOSPITAL_BASED_OUTPATIENT_CLINIC_OR_DEPARTMENT_OTHER): Payer: Medicare Other | Admitting: Physical Therapy

## 2021-05-09 ENCOUNTER — Ambulatory Visit (INDEPENDENT_AMBULATORY_CARE_PROVIDER_SITE_OTHER): Payer: Medicare Other

## 2021-05-09 DIAGNOSIS — I495 Sick sinus syndrome: Secondary | ICD-10-CM | POA: Diagnosis not present

## 2021-05-09 LAB — CUP PACEART REMOTE DEVICE CHECK
Battery Remaining Longevity: 68 mo
Battery Remaining Percentage: 59 %
Battery Voltage: 3.01 V
Brady Statistic RV Percent Paced: 50 %
Date Time Interrogation Session: 20230303021250
Implantable Lead Implant Date: 20181211
Implantable Lead Implant Date: 20181211
Implantable Lead Location: 753859
Implantable Lead Location: 753860
Implantable Pulse Generator Implant Date: 20181211
Lead Channel Impedance Value: 350 Ohm
Lead Channel Pacing Threshold Amplitude: 0.5 V
Lead Channel Pacing Threshold Pulse Width: 0.5 ms
Lead Channel Sensing Intrinsic Amplitude: 10.4 mV
Lead Channel Setting Pacing Amplitude: 2.5 V
Lead Channel Setting Pacing Pulse Width: 0.5 ms
Lead Channel Setting Sensing Sensitivity: 2 mV
Pulse Gen Model: 2272
Pulse Gen Serial Number: 8967264

## 2021-05-11 NOTE — Therapy (Signed)
OUTPATIENT PHYSICAL THERAPY TREATMENT NOTE   Patient Name: Todd Chapman MRN: [id number] DOB:Sep 16, 1945, 76 y.o., male Today's Date: 05/13/2021  PCP: [name], MD    PT End of Session - 05/12/21 1251     Visit Number 13    Number of Visits 18    Date for PT Re-Evaluation 05/30/21    Authorization Type Medicare A&B    Progress Note Due on Visit 55    PT Start Time 1158    PT Stop Time 1240    PT Time Calculation (min) 42 min    Activity Tolerance Patient tolerated treatment well    Behavior During Therapy WFL for tasks assessed/performed                   Past Medical History:  Diagnosis Date   Arthritis    "thumbs, back" (02/16/2017)   Bradycardia 02/2017   Dyspnea    with exertion   Flu 2 weeks ago   GERD (gastroesophageal reflux disease)    Hepatitis A as child   HLD (hyperlipidemia)    Persistent atrial fibrillation ([place]) 12/18/2016   Pneumonia 1999; 2018   Presence of permanent cardiac pacemaker    st jude   Prostate cancer [facility]    Past Surgical History:  Procedure Laterality Date   APPENDECTOMY     age 71   CARDIOVERSION  02/16/2017   CARDIOVERSION N/A 02/16/2017   Procedure: CARDIOVERSION;  Surgeon: Thompson Grayer, MD;  Location: [place] [facility];  Service: Cardiovascular;  Laterality: N/A;   COLONOSCOPY WITH PROPOFOL N/A 12/13/2012   Procedure: COLONOSCOPY WITH PROPOFOL;  Surgeon: Garlan Fair, MD;  [facility];  Service: Endoscopy;  Laterality: N/A;   FRACTURE SURGERY     HYDROCELE EXCISION Left 05/16/2018   Procedure: HYDROCELECTOMY ADULT;  Surgeon: [name], MD;  [facility];  Service: Urology;  Laterality: Left;   INSERT / REPLACE / REMOVE PACEMAKER  02/16/2017   KYPHOPLASTY N/A 02/10/2021   Procedure: KYPHOPLASTY L-5;  Surgeon: Newman Pies, MD;  Location: [place];  Service: Neurosurgery;  Laterality: N/A;   LAPAROSCOPY N/A 06/04/2020   Procedure: POSSIBLE LAPAROTOMY POSSIBLE BOWEL  RESSECTION;  Surgeon: Clovis Riley, MD;  [facility];  Service: General;  Laterality: N/A;   PACEMAKER IMPLANT N/A 02/16/2017   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 by Dr Rayann Heman   PROSTATE BIOPSY  11/23/2019   WRIST FRACTURE SURGERY Left    with pins   Patient Active Problem List   Diagnosis Date Noted   Lumbar radiculopathy 05/02/2021   Vertebral compression fracture ([place]) 02/12/2021   Bilateral acetabular fractures, closed, initial encounter ([place]) 02/08/2021   Closed compression fracture of L5 lumbar vertebra, initial encounter ([place]) 02/08/2021   Back pain 02/06/2021   SBO (small bowel obstruction) ([place]) 06/01/2020   Pacemaker 05/13/2020   Malignant neoplasm of prostate ([place]) 01/09/2020   Annual physical exam 01/08/2020   Acute upper respiratory infection 01/08/2020   Asymptomatic varicose veins of lower extremity 01/08/2020   Bronchitis 01/08/2020   Cataract 01/08/2020   Constipation 01/08/2020   Diverticulosis of large intestine without perforation or abscess without bleeding 01/08/2020   Screening for gout 01/08/2020   False positive serological test for syphilis 01/08/2020   Gastro-esophageal reflux disease without esophagitis 01/08/2020   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   History of colonic polyps 01/08/2020   Impacted cerumen 01/08/2020   Insomnia 01/08/2020   Osteoarthritis 01/08/2020  Polypharmacy 01/08/2020   Pure hypercholesterolemia 01/08/2020   Rosacea 01/08/2020   Combined forms of age-related cataract of both eyes 07/13/2019   Elevated blood pressure reading 11/08/2018   Sick sinus syndrome ([place]) 02/16/2017   Persistent atrial fibrillation ([place]) 12/18/2016   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   Nonexudative age-related macular degeneration, bilateral, 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   Dermatochalasis of both upper eyelids 07/25/2014   Nuclear sclerosis of both eyes 07/25/2014    Bradycardia 05/29/2014   Premature atrial contractions 05/29/2014   Premature junctional contractions ([place]) 05/29/2014   Hyperlipidemia 05/29/2014   Encounter for general adult medical examination with abnormal findings 04/24/2014   Vitamin D deficiency 04/24/2014    REFERRING PROVIDER: Barbie Banner, PA-C    REFERRING DIAG: [phone] (ICD-10-CM) - Collapsed vertebra, not elsewhere classified, site unspecified, initial encounter for fracture    THERAPY DIAG:  Chronic low back pain without sciatica, unspecified back pain laterality   Muscle weakness (generalized)   Difficulty in walking, not elsewhere classified   ONSET DATE: Bilateral L5 kyphoplasty and vertebral body biopsies on 02/10/2021   SUBJECTIVE:                                                                                                                                                                                            SUBJECTIVE STATEMENT: -Pt saw Dr. Lyla Glassing on 03/04/2021 and gave him an order for PT for Pubic Rami stress fractures.  Eval and treat bilt pubic rami stress fx and troch bursitis.   -Pt is 13 weeks s/p Bilateral L5 kyphoplasty and vertebral body biopsies by Dr. Earle Gell on 02/10/2021.    -Pt reports compliance with HEP.  Pt states he walked 1/4 mile for 4 days straight before prior Rx.  He received soft tissue work last Rx and felt much much better.  He states the soft tissue work helped tremendously.  He states his R SI felt like his other side after the soft tissue work.  He has walked 1/4 mile for 3 more days consecutively and has pain.  -Pt hasn't seen surgeon but did see Dr. Dagoberto Ligas.  Pt states MD informed him he could return to gym exercises in 3-6 months post op and PT would direct him in appropriate gym program.  MD note confirms.   PERTINENT HISTORY:  02/10/2021 bilat L5 kyphoplasty,  bilat acetabular stress fractures and pubic rami stress Fx; Grade 1 anterolisthesis at L4-L5; PACEMAKER,  A-fib, hx of prostate CA    PAIN:  Are you having pain? Yes NPRS scale: 6/10 current Pain location:  R SI to center of lumbar Aggravating  factors: standing Relieving factors:  tylenol, oxycodone   PRECAUTIONS: Back--No bending, lifting > 10# , twisting   WEIGHT BEARING RESTRICTIONS No     PATIENT GOALS to return to the gym; improve functional mobility     OBJECTIVE:    DIAGNOSTIC FINDINGS: (In Epic) -MRI on 02/07/2021 (prior to surgery) which showed Grade 1 anterolisthesis at L4-L5 and L5 inferior endplate compression fracture with less than 50% loss of height. -Earlier MRI showed Moderate osteoarthritis of bilateral hips and bilat acetabular stress/insufficiency fractures.  MRI indicated differential considerations include severe stress reaction possible developing insufficiency fracture versus underlying malignancy secondary to metastatic disease given the patient's history of prostate cancer. -MRI pelvis: 1. Bilateral acetabular/supra-acetabular stress fractures in the pelvis. There is likewise some stress fracture involving the left medial superior pubic ramus. This overall represents a progression compared to the previous unilateral left acetabular stress fracture shown on 11/04/2020.      TODAY'S TREATMENT     Therapeutic Exercise:  -Reviewed response to prior Rx, HEP compliance, current function, and pain level. -Pt performed:           Supine manual HS stretch 3x20-30 sec bilat.   Supine bridge 3 x 10 reps with decreased height.                          Q-ped alt UE raises approx 2x10 reps Attempted Q-ped alt LE extension though stopped due to limited motion with R LE and pain at R SI NuStep L5 (legs only) x 6 mins  Mini squats with TrA 2x10 reps -See below for Pt education  MANUAL Therapy: STM & trigger point pressure release to Rt glute and STM to Rt lumbar paraspinals in L S/L'ing with pillow b/w knees       PATIENT EDUCATION:  Education details:  PT spent  time answering pt's questions concerning appropriate exercises and stretches.  HEP, protocol, and POC. Education method: Explanation, demonstration, verbal cues, tactile cues Education comprehension: verbalized understanding and returned demonstration, verbal cues and tactile cues required     HOME EXERCISE PROGRAM: Access Code: [email or web address] URL: [email or web address]     ASSESSMENT:   CLINICAL IMPRESSION:  PT spent time answering pt's questions and educated pt concerning appropriate exercises and stretches and precautions.  Pt performed exercises well with cuing for correct form and positioning.  Pt attempted Q-ped alt LE extension though stopped due to limited ROM and pain with increasing ROM.  Pt reports much improved pain after prior Rx with soft tissue work.  Pt received STM today also and states he feels better after STM and reports reduced pain after STM at the end of Rx.     Objective impairments include Abnormal gait, decreased activity tolerance, decreased balance, decreased endurance, decreased mobility, difficulty walking, decreased ROM, decreased strength, hypomobility, impaired flexibility, postural dysfunction, and pain. These impairments are limiting patient from cleaning, community activity, meal prep, occupation, laundry, shopping, and ambulation . Personal factors including 3+ comorbidities:  bilat acetabular stress fractures and pubic rami stress Fx; Pacemaker, A-fib, and hx of prostate CA   are also affecting patient's functional outcome.      REHAB POTENTIAL: Good   CLINICAL DECISION MAKING: Evolving/moderate complexity   EVALUATION COMPLEXITY: Moderate     GOALS:     SHORT TERM GOALS:   STG Name Target Date Goal status  1 Pt will be independent and compliant with HEP for improved pain, strength, and mobility.  Baseline:  03/28/2021 ACHIEVED  2 Pt will demo improved limp with gait without AD Baseline:  04/04/2021 ACHIEVED  3 Pt will progress with  walking program without adverse effects.  Baseline: 04/18/2021 ONGOING  4 Pt will be able to perform bed mobility without increased pain or difficulty.  Baseline: 04/11/2021 ACHIEVED  5 Pt will perform core exercises without adverse effects for improved core strength in order to perform daily activities and functional mobility with reduced stress on lumbar, increased ease, and reduced pain Baseline: 04/04/2021 ONGOING                      LONG TERM GOALS:    LTG Name Target Date Goal status  1 Pt will be able to perform occupational activities without significant pain or difficulty. Baseline: 05/30/2021 INITIAL  2 Pt will be able to perform extended community ambulation without an AD without increased pain or difficulty.  Baseline: 05/30/2021 INITIAL  3 Pt will ambulate without an AD without limping.  Baseline: 05/16/2021 INITIAL  4 Pt will be able to perform IADLs and household chores including his normal standing activities such as preparing food and cooking without significant pain or limitation.  Baseline: 05/30/2021 INITIAL      PLAN:   PLANNED INTERVENTIONS: Therapeutic exercises, Therapeutic activity, Neuro Muscular re-education, Balance training, Gait training, Patient/Family education, Stair training, DME instructions, Aquatic Therapy, Cryotherapy, Taping, and Manual therapy   PLAN FOR NEXT SESSION: NO E-STIM--Pt has a PACEMAKER.   Progress core exercises per kyphoplasty protocol.   Evaluate outcome of STM   Selinda Michaels III PT, DPT 05/13/21 1:32 PM

## 2021-05-12 ENCOUNTER — Ambulatory Visit (HOSPITAL_BASED_OUTPATIENT_CLINIC_OR_DEPARTMENT_OTHER): Payer: Medicare Other | Attending: Physician Assistant | Admitting: Physical Therapy

## 2021-05-12 ENCOUNTER — Other Ambulatory Visit: Payer: Self-pay

## 2021-05-12 DIAGNOSIS — M545 Low back pain, unspecified: Secondary | ICD-10-CM | POA: Insufficient documentation

## 2021-05-12 DIAGNOSIS — R262 Difficulty in walking, not elsewhere classified: Secondary | ICD-10-CM | POA: Diagnosis not present

## 2021-05-12 DIAGNOSIS — M6281 Muscle weakness (generalized): Secondary | ICD-10-CM | POA: Insufficient documentation

## 2021-05-12 DIAGNOSIS — S32050G Wedge compression fracture of fifth lumbar vertebra, subsequent encounter for fracture with delayed healing: Secondary | ICD-10-CM | POA: Insufficient documentation

## 2021-05-12 DIAGNOSIS — G8929 Other chronic pain: Secondary | ICD-10-CM | POA: Insufficient documentation

## 2021-05-13 ENCOUNTER — Encounter (HOSPITAL_BASED_OUTPATIENT_CLINIC_OR_DEPARTMENT_OTHER): Payer: Self-pay | Admitting: Physical Therapy

## 2021-05-14 ENCOUNTER — Telehealth: Payer: Self-pay

## 2021-05-14 ENCOUNTER — Ambulatory Visit (HOSPITAL_BASED_OUTPATIENT_CLINIC_OR_DEPARTMENT_OTHER): Payer: Medicare Other | Admitting: Physical Therapy

## 2021-05-14 ENCOUNTER — Encounter (HOSPITAL_BASED_OUTPATIENT_CLINIC_OR_DEPARTMENT_OTHER): Payer: Self-pay | Admitting: Physical Therapy

## 2021-05-14 ENCOUNTER — Other Ambulatory Visit: Payer: Self-pay

## 2021-05-14 DIAGNOSIS — R262 Difficulty in walking, not elsewhere classified: Secondary | ICD-10-CM | POA: Diagnosis not present

## 2021-05-14 DIAGNOSIS — M6281 Muscle weakness (generalized): Secondary | ICD-10-CM

## 2021-05-14 DIAGNOSIS — M545 Low back pain, unspecified: Secondary | ICD-10-CM

## 2021-05-14 DIAGNOSIS — S32050G Wedge compression fracture of fifth lumbar vertebra, subsequent encounter for fracture with delayed healing: Secondary | ICD-10-CM | POA: Diagnosis not present

## 2021-05-14 DIAGNOSIS — G8929 Other chronic pain: Secondary | ICD-10-CM | POA: Diagnosis not present

## 2021-05-14 MED ORDER — HYDROCODONE-ACETAMINOPHEN 5-325 MG PO TABS: 1.0000 | ORAL_TABLET | Freq: Four times a day (QID) | ORAL | 0 refills | Status: DC | PRN

## 2021-05-14 NOTE — Telephone Encounter (Signed)
Todd Chapman has called in for his pain medication refill. He stated it will be okay to change the Oxycodone to Hydrocodone as discussed at the last office visit. Please send new Rx to [facility] on [address].  ? ?PMP REPORT: ?Filled  Written  ID  Drug  QTY  Days  Prescriber  RX #  Dispenser  Refill  Daily Dose*  Pymt Type  PMP  ?04/17/2021 04/17/2021 1  ?Oxycodone Hcl (Ir) 5 Mg Tablet ?150.00 30 Me Lov [id number] [facility] ([id number]) 0/0 37.50 MME Comm Ins [place] ?02/28/2021 02/26/2021 1  ?Oxycontin Er 15 Mg Tablet ?60.00 30 Me Lov [id number] [facility] ([id number]) 0/0 45.00 MME Medicare [place] ?

## 2021-05-14 NOTE — Therapy (Signed)
OUTPATIENT PHYSICAL THERAPY TREATMENT NOTE   Patient Name: Todd Chapman MRN: [id number] DOB:[birth year], 76 y.o., male Today's Date: 05/14/2021  PCP: [name], MD    PT End of Session - 05/14/21 1521     Visit Number 14    Number of Visits 18    Date for PT Re-Evaluation 05/30/21    Authorization Type Medicare A&B    PT Start Time 1520    PT Stop Time 1545   pt needed to leave early today   PT Time Calculation (min) 25 min    Activity Tolerance Patient tolerated treatment well    Behavior During Therapy [facility] for tasks assessed/performed                    Past Medical History:  Diagnosis Date   Arthritis    "thumbs, back" (02/16/2017)   Bradycardia 02/2017   Dyspnea    with exertion   Flu 2 weeks ago   GERD (gastroesophageal reflux disease)    Hepatitis A as child   HLD (hyperlipidemia)    Persistent atrial fibrillation ([place]) 12/18/2016   Pneumonia 1999; 2018   Presence of permanent cardiac pacemaker    st jude   Prostate cancer [facility])    Past Surgical History:  Procedure Laterality Date   APPENDECTOMY     age 21   CARDIOVERSION  02/16/2017   CARDIOVERSION N/A 02/16/2017   Procedure: CARDIOVERSION;  Surgeon: Thompson Grayer, MD;  Location: [place] [facility];  Service: Cardiovascular;  Laterality: N/A;   COLONOSCOPY WITH PROPOFOL N/A 12/13/2012   Procedure: COLONOSCOPY WITH PROPOFOL;  Surgeon: Garlan Fair, MD;  [facility];  Service: Endoscopy;  Laterality: N/A;   FRACTURE SURGERY     HYDROCELE EXCISION Left 05/16/2018   Procedure: HYDROCELECTOMY ADULT;  Surgeon: [name], MD;  Location[facility];  Service: Urology;  Laterality: Left;   INSERT / REPLACE / REMOVE PACEMAKER  02/16/2017   KYPHOPLASTY N/A 02/10/2021   Procedure: KYPHOPLASTY L-5;  Surgeon: Newman Pies, MD;  Location: [place];  Service: Neurosurgery;  Laterality: N/A;   LAPAROSCOPY N/A 06/04/2020   Procedure: POSSIBLE LAPAROTOMY POSSIBLE BOWEL  RESSECTION;  Surgeon: Clovis Riley, MD;  Location: [facility];  Service: General;  Laterality: N/A;   PACEMAKER IMPLANT N/A 02/16/2017   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 by Dr Rayann Heman   PROSTATE BIOPSY  11/23/2019   WRIST FRACTURE SURGERY Left    with pins   Patient Active Problem List   Diagnosis Date Noted   Lumbar radiculopathy 05/02/2021   Vertebral compression fracture ([place]) 02/12/2021   Bilateral acetabular fractures, closed, initial encounter ([place]) 02/08/2021   Closed compression fracture of L5 lumbar vertebra, initial encounter ([place]) 02/08/2021   Back pain 02/06/2021   SBO (small bowel obstruction) ([place]) 06/01/2020   Pacemaker 05/13/2020   Malignant neoplasm of prostate ([place]) 01/09/2020   Annual physical exam 01/08/2020   Acute upper respiratory infection 01/08/2020   Asymptomatic varicose veins of lower extremity 01/08/2020   Bronchitis 01/08/2020   Cataract 01/08/2020   Constipation 01/08/2020   Diverticulosis of large intestine without perforation or abscess without bleeding 01/08/2020   Screening for gout 01/08/2020   False positive serological test for syphilis 01/08/2020   Gastro-esophageal reflux disease without esophagitis 01/08/2020   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   History of colonic polyps 01/08/2020   Impacted cerumen 01/08/2020   Insomnia 01/08/2020   Osteoarthritis 01/08/2020   Polypharmacy  01/08/2020   Pure hypercholesterolemia 01/08/2020   Rosacea 01/08/2020   Combined forms of age-related cataract of both eyes 07/13/2019   Elevated blood pressure reading 11/08/2018   Sick sinus syndrome ([place]) 02/16/2017   Persistent atrial fibrillation ([place]) 12/18/2016   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   Nonexudative age-related macular degeneration, bilateral, 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   Dermatochalasis of both upper eyelids 07/25/2014   Nuclear sclerosis of both eyes 07/25/2014    Bradycardia 05/29/2014   Premature atrial contractions 05/29/2014   Premature junctional contractions ([place]) 05/29/2014   Hyperlipidemia 05/29/2014   Encounter for general adult medical examination with abnormal findings 04/24/2014   Vitamin D deficiency 04/24/2014    REFERRING PROVIDER: Barbie Banner, PA-C    REFERRING DIAG: [phone] (ICD-10-CM) - Collapsed vertebra, not elsewhere classified, site unspecified, initial encounter for fracture    THERAPY DIAG:  Chronic low back pain without sciatica, unspecified back pain laterality   Muscle weakness (generalized)   Difficulty in walking, not elsewhere classified   ONSET DATE: Bilateral L5 kyphoplasty and vertebral body biopsies on 02/10/2021   SUBJECTIVE:                                                                                                                                                                                            SUBJECTIVE STATEMENT: Next morning after PT session all tightness was back. Walking 1/4 mile daily which irritates it.    PERTINENT HISTORY:  02/10/2021 bilat L5 kyphoplasty,  bilat acetabular stress fractures and pubic rami stress Fx; Grade 1 anterolisthesis at L4-L5; PACEMAKER, A-fib, hx of prostate CA    PAIN:  Are you having pain? Yes NPRS scale: 6/10 current Pain location:  R SI to center of lumbar Aggravating factors: standing Relieving factors:  tylenol, oxycodone   PRECAUTIONS: Back--No bending, lifting > 10# , twisting   WEIGHT BEARING RESTRICTIONS No     PATIENT GOALS to return to the gym; improve functional mobility     OBJECTIVE:    DIAGNOSTIC FINDINGS: (In Epic) -MRI on 02/07/2021 (prior to surgery) which showed Grade 1 anterolisthesis at L4-L5 and L5 inferior endplate compression fracture with less than 50% loss of height. -Earlier MRI showed Moderate osteoarthritis of bilateral hips and bilat acetabular stress/insufficiency fractures.  MRI indicated differential  considerations include severe stress reaction possible developing insufficiency fracture versus underlying malignancy secondary to metastatic disease given the patient's history of prostate cancer. -MRI pelvis: 1. Bilateral acetabular/supra-acetabular stress fractures in the pelvis. There is likewise some stress fracture involving the left medial superior pubic ramus. This overall represents a progression compared to the previous unilateral  left acetabular stress fracture shown on 11/04/2020.      TODAY'S TREATMENT  05/14/21: MANUAL: STM around Rt SIJ, PA spring hold to Lt sacral upper and lower quadrants Bridge with ball squeeze Hooklying clam-green Bridge with green band at knees  05/12/21: Therapeutic Exercise:  -Reviewed response to prior Rx, HEP compliance, current function, and pain level. -Pt performed:           Supine manual HS stretch 3x20-30 sec bilat.   Supine bridge 3 x 10 reps with decreased height.                          Q-ped alt UE raises approx 2x10 reps Attempted Q-ped alt LE extension though stopped due to limited motion with R LE and pain at R SI NuStep L5 (legs only) x 6 mins  Mini squats with TrA 2x10 reps -See below for Pt education  MANUAL Therapy: STM & trigger point pressure release to Rt glute and STM to Rt lumbar paraspinals in L S/L'ing with pillow b/w knees       PATIENT EDUCATION:  Education details:  exercise form/rationale Education method: Explanation, demonstration, verbal cues, tactile cues Education comprehension: verbalized understanding and returned demonstration, verbal cues and tactile cues required     HOME EXERCISE PROGRAM: Access Code: [email or web address] URL: [email or web address]     ASSESSMENT:   CLINICAL IMPRESSION: Incr spring mobility in Rt SIJ compared to Lt with trigger points continued in gluts- pt requested to avoid using TPDN. Will focus on stability and reduce repetitive motion of walking until next appointment.    Objective impairments include Abnormal gait, decreased activity tolerance, decreased balance, decreased endurance, decreased mobility, difficulty walking, decreased ROM, decreased strength, hypomobility, impaired flexibility, postural dysfunction, and pain. These impairments are limiting patient from cleaning, community activity, meal prep, occupation, laundry, shopping, and ambulation . Personal factors including 3+ comorbidities:  bilat acetabular stress fractures and pubic rami stress Fx; Pacemaker, A-fib, and hx of prostate CA   are also affecting patient's functional outcome.      REHAB POTENTIAL: Good   CLINICAL DECISION MAKING: Evolving/moderate complexity   EVALUATION COMPLEXITY: Moderate     GOALS:     SHORT TERM GOALS:   STG Name Target Date Goal status  1 Pt will be independent and compliant with HEP for improved pain, strength, and mobility.  Baseline:  03/28/2021 ACHIEVED  2 Pt will demo improved limp with gait without AD Baseline:  04/04/2021 ACHIEVED  3 Pt will progress with walking program without adverse effects.  Baseline: 04/18/2021 ONGOING  4 Pt will be able to perform bed mobility without increased pain or difficulty.  Baseline: 04/11/2021 ACHIEVED  5 Pt will perform core exercises without adverse effects for improved core strength in order to perform daily activities and functional mobility with reduced stress on lumbar, increased ease, and reduced pain Baseline: 04/04/2021 ONGOING                      LONG TERM GOALS:    LTG Name Target Date Goal status  1 Pt will be able to perform occupational activities without significant pain or difficulty. Baseline: 05/30/2021 INITIAL  2 Pt will be able to perform extended community ambulation without an AD without increased pain or difficulty.  Baseline: 05/30/2021 INITIAL  3 Pt will ambulate without an AD without limping.  Baseline: 05/16/2021 INITIAL  4 Pt will be able to perform IADLs and household chores including  his normal standing activities such as preparing food and cooking without significant pain or limitation.  Baseline: 05/30/2021 INITIAL      PLAN:   PLANNED INTERVENTIONS: Therapeutic exercises, Therapeutic activity, Neuro Muscular re-education, Balance training, Gait training, Patient/Family education, Stair training, DME instructions, Aquatic Therapy, Cryotherapy, Taping, and Manual therapy   PLAN FOR NEXT SESSION: NO E-STIM--Pt has a PACEMAKER.   Progress core exercises per kyphoplasty protocol.   Re-eval & recert, outcome of focus on stability vs walking?  Elizabethann Lackey C. Kaycee Mcgaugh PT, DPT 05/14/21 3:49 PM

## 2021-05-19 NOTE — Progress Notes (Signed)
Remote pacemaker transmission.   

## 2021-05-25 NOTE — Therapy (Signed)
?OUTPATIENT PHYSICAL THERAPY TREATMENT NOTE ? ? ?Patient Name: Todd Chapman ?MRN: [id number] ?DOB:Feb 03, 1946, 76 y.o., male ?Today's Date: 05/18/2021 ? ?PCP: [name], MD ? ? ? ?05/26/2021   ? 1246  ?PT Visits / Re-Eval ?  ?Visit Number 15  ?Number of Visits 18  ?Date for PT Re-Evaluation 05/30/2021  ?Authorization ?  ?Authorization Type Medicare A&B  ?Authorization Time Period --  ?Authorization - Visit Number --  ?Authorization - Number of Visits --  ?Progress Note Due on Visit 20  ?PT Time Calculation ?  ?PT Start Time 1152  ?PT Stop Time 1237  ?PT Time Calculation (min) 45 min  ?PT - End of Session ?  ?Equipment Utilized During Treatment --  ?Activity Tolerance Patient tolerated treatment well  ?Behavior During Therapy [facility] for tasks assessed/performed  ? ? ? ? ? ? ? ? ? ?Past Medical History:  ?Diagnosis Date  ? Arthritis   ? "thumbs, back" (02/16/2017)  ? Bradycardia 02/2017  ? Dyspnea   ? with exertion  ? Flu 2 weeks ago  ? GERD (gastroesophageal reflux disease)   ? Hepatitis A as child  ? HLD (hyperlipidemia)   ? Persistent atrial fibrillation ([place]) 12/18/2016  ? Pneumonia 1999; 2018  ? Presence of permanent cardiac pacemaker   ? st jude  ? Prostate cancer ([place])   ? ?Past Surgical History:  ?Procedure Laterality Date  ? APPENDECTOMY    ? age 32  ? CARDIOVERSION  02/16/2017  ? CARDIOVERSION N/A 02/16/2017  ? Procedure: CARDIOVERSION;  Surgeon: Thompson Grayer, MD;  Location: [place] [facility];  Service: Cardiovascular;  Laterality: N/A;  ? COLONOSCOPY WITH PROPOFOL N/A 12/13/2012  ? Procedure: COLONOSCOPY WITH PROPOFOL;  Surgeon: Garlan Fair, MD;  [facility];  Service: Endoscopy;  Laterality: N/A;  ? FRACTURE SURGERY    ? HYDROCELE EXCISION Left 05/16/2018  ? Procedure: HYDROCELECTOMY ADULT;  Surgeon: [name], MD;  Location: [facility];  Service: Urology;  Laterality: Left;  ? INSERT / REPLACE / REMOVE PACEMAKER  02/16/2017  ? KYPHOPLASTY N/A 02/10/2021  ? Procedure:  KYPHOPLASTY L-5;  Surgeon: Newman Pies, MD;  Location: [place];  Service: Neurosurgery;  Laterality: N/A;  ? LAPAROSCOPY N/A 06/04/2020  ? Procedure: POSSIBLE LAPAROTOMY POSSIBLE BOWEL RESSECTION;  Surgeon: Clovis Riley, MD;  [facility];  Service: General;  Laterality: N/A;  ? PACEMAKER IMPLANT N/A 02/16/2017  ? 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 PROSTATE BIOPSY  11/23/2019  ? WRIST FRACTURE SURGERY Left   ? with pins  ? ?Patient Active Problem List  ? Diagnosis Date Noted  ? Lumbar radiculopathy 05/02/2021  ? Vertebral compression fracture ([place]) 02/12/2021  ? Bilateral acetabular fractures, closed, initial encounter ([place]) 02/08/2021  ? Closed compression fracture of L5 lumbar vertebra, initial encounter ([place]) 02/08/2021  ? Back pain 02/06/2021  ? SBO (small bowel obstruction) ([place]) 06/01/2020  ? Pacemaker 05/13/2020  ? Malignant neoplasm of prostate ([place]) 01/09/2020  ? Annual physical exam 01/08/2020  ? Acute upper respiratory infection 01/08/2020  ? Asymptomatic varicose veins of lower extremity 01/08/2020  ? Bronchitis 01/08/2020  ? Cataract 01/08/2020  ? Constipation 01/08/2020  ? Diverticulosis of large intestine without perforation or abscess without bleeding 01/08/2020  ? Screening for gout 01/08/2020  ? False positive serological test for syphilis 01/08/2020  ? Gastro-esophageal reflux disease without esophagitis 01/08/2020  ? 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 History of colonic polyps 01/08/2020  ?  Impacted cerumen 01/08/2020  ? Insomnia 01/08/2020  ? Osteoarthritis 01/08/2020  ? Polypharmacy 01/08/2020  ? Pure hypercholesterolemia 01/08/2020  ? Rosacea 01/08/2020  ? Combined forms of age-related cataract of both eyes 07/13/2019  ? Elevated blood pressure reading 11/08/2018  ? Sick sinus syndrome ([place]) 02/16/2017  ? Persistent atrial fibrillation ([place]) 12/18/2016  ? 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  Nonexudative age-related macular degeneration, bilateral, 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  ? Dermatochalasis of both upper eyelids 07/25/2014  ? Nuclear sclerosis of both eyes 07/25/2014  ? Bradycardia 05/29/2014  ? Premature atrial contractions 05/29/2014  ? Premature junctional contractions ([place]) 05/29/2014  ? Hyperlipidemia 05/29/2014  ? Encounter for general adult medical examination with abnormal findings 04/24/2014  ? Vitamin D deficiency 04/24/2014  ? ? ?REFERRING PROVIDER: Barbie Banner, PA-C  ?  ?REFERRING DIAG: M48.50XA (ICD-10-CM) - Collapsed vertebra, not elsewhere classified, site unspecified, initial encounter for fracture  ?  ?THERAPY DIAG:  ?Chronic low back pain without sciatica, unspecified back pain laterality ?  ?Muscle weakness (generalized) ?  ?Difficulty in walking, not elsewhere classified ?  ?ONSET DATE: Bilateral L5 kyphoplasty and vertebral body biopsies on 02/10/2021 ?  ?SUBJECTIVE:                                                                                                                                                                                          ?  ?SUBJECTIVE STATEMENT: ?Pt is 15 weeks s/p bilat L5 kyphoplasty.  Pt states he felt good after prior Rx though tightness did return.  Pt reports he is walking less as directed by PT.  Pt states his legs feel "rubbery" and reports having weakness in his LE's. ?  ?PERTINENT HISTORY:  ?02/10/2021 bilat L5 kyphoplasty,  bilat acetabular stress fractures and pubic rami stress Fx; Grade 1 anterolisthesis at L4-L5; PACEMAKER, A-fib, hx of prostate CA  ?  ?PAIN:  ?Are you having pain? Yes ?NPRS scale: 4-5/10 / 2/10 current ?Pain location:  R SI area / lumbar  ?Aggravating factors: standing ?Relieving factors:  tylenol, oxycodone ?  ?PRECAUTIONS: Back--No bending, lifting > 10# , twisting ?  ?WEIGHT BEARING RESTRICTIONS No ?  ?  ?PATIENT GOALS to return to the gym; improve functional mobility ?  ?  ?OBJECTIVE:  ?  ?DIAGNOSTIC FINDINGS:  (In Epic) ?-MRI on 02/07/2021 (prior to surgery) which showed Grade 1 anterolisthesis at L4-L5 and L5 inferior endplate compression fracture with less than 50% loss of height. ?-Earlier MRI showed Moderate osteoarthritis of bilateral hips and bilat acetabular stress/insufficiency fractures.  MRI indicated differential considerations include severe stress reaction possible developing insufficiency fracture versus underlying  malignancy secondary to metastatic disease given the patient's history of prostate cancer. ?-MRI pelvis: 1. Bilateral acetabular/supra-acetabular stress fractures in the pelvis. There is likewise some stress fracture involving the left medial superior pubic ramus. This overall represents a progression compared to the previous unilateral left acetabular stress fracture shown on 11/04/2020. ? ? ? ?  ?TODAY'S TREATMENT  ?Therapeutic Exercise:  ?-Reviewed response to prior Rx, HEP compliance, current function, and pain level. ?-Pt performed: ?          supine bridge x 10 reps, with GTB around thighs 2x10, on airex without band x 10 reps ?Hooklying clam-green 2x10 reps ?Supine manual HS stretch 3x20-30 sec bilat.   ?                        Q-ped alt UE raises approx 2x10 reps ?NuStep L5 (legs only) x 5 mins ? Standing on Airex: ?  Rows with GTB with TrA contraction 3x10 reps ?  Shoulder ext with GTB with TrA contraction 3x10 reps ?-See below for Pt education ? ?MANUAL Therapy: STM & trigger point pressure release to Rt glute and STM to Rt lumbar paraspinals in L S/L'ing with pillow b/w knees ? ? ? ?   ?PATIENT EDUCATION:  ?Education details:  PT answered questions.  POC.  exercise form/rationale ?Education method: Explanation, demonstration, verbal cues, tactile cues ?Education comprehension: verbalized understanding and returned demonstration, verbal cues and tactile cues required ?  ?  ?HOME EXERCISE PROGRAM: ?Access Code: [email or web address] ?URL: [email or web address] ? ? ?  ?ASSESSMENT: ?   ?CLINICAL IMPRESSION: ?Pt performed exercises well.  He is motivated and gives good effort with all exercises.  Progressed standing rows and shld ext to standing on airex for improved core activation.  Pt continue

## 2021-05-26 ENCOUNTER — Other Ambulatory Visit: Payer: Self-pay

## 2021-05-26 ENCOUNTER — Ambulatory Visit (HOSPITAL_BASED_OUTPATIENT_CLINIC_OR_DEPARTMENT_OTHER): Payer: Medicare Other | Admitting: Physical Therapy

## 2021-05-26 DIAGNOSIS — G8929 Other chronic pain: Secondary | ICD-10-CM | POA: Diagnosis not present

## 2021-05-26 DIAGNOSIS — M6281 Muscle weakness (generalized): Secondary | ICD-10-CM

## 2021-05-26 DIAGNOSIS — M545 Low back pain, unspecified: Secondary | ICD-10-CM | POA: Diagnosis not present

## 2021-05-26 DIAGNOSIS — R262 Difficulty in walking, not elsewhere classified: Secondary | ICD-10-CM | POA: Diagnosis not present

## 2021-05-26 DIAGNOSIS — S32050G Wedge compression fracture of fifth lumbar vertebra, subsequent encounter for fracture with delayed healing: Secondary | ICD-10-CM | POA: Diagnosis not present

## 2021-05-27 ENCOUNTER — Encounter (HOSPITAL_BASED_OUTPATIENT_CLINIC_OR_DEPARTMENT_OTHER): Payer: Self-pay | Admitting: Physical Therapy

## 2021-05-29 ENCOUNTER — Ambulatory Visit (HOSPITAL_BASED_OUTPATIENT_CLINIC_OR_DEPARTMENT_OTHER): Payer: Medicare Other | Admitting: Physical Therapy

## 2021-05-29 ENCOUNTER — Other Ambulatory Visit: Payer: Self-pay

## 2021-05-29 ENCOUNTER — Encounter (HOSPITAL_BASED_OUTPATIENT_CLINIC_OR_DEPARTMENT_OTHER): Payer: Medicare Other | Admitting: Physical Therapy

## 2021-05-29 DIAGNOSIS — S32050G Wedge compression fracture of fifth lumbar vertebra, subsequent encounter for fracture with delayed healing: Secondary | ICD-10-CM | POA: Diagnosis not present

## 2021-05-29 DIAGNOSIS — M545 Low back pain, unspecified: Secondary | ICD-10-CM | POA: Diagnosis not present

## 2021-05-29 DIAGNOSIS — M6281 Muscle weakness (generalized): Secondary | ICD-10-CM

## 2021-05-29 DIAGNOSIS — G8929 Other chronic pain: Secondary | ICD-10-CM | POA: Diagnosis not present

## 2021-05-29 DIAGNOSIS — R262 Difficulty in walking, not elsewhere classified: Secondary | ICD-10-CM

## 2021-05-29 NOTE — Therapy (Signed)
?OUTPATIENT PHYSICAL THERAPY TREATMENT NOTE ?Progress Note ?Reporting Period 04/30/2021 to 05/28/2021 ? ?See note below for Objective Data and Assessment of Progress/Goals.  ? ? ? ? ?Patient Name: Todd Chapman ?MRN: [id number] ?DOB:01/13/1946, 76 y.o., male ?Today's Date: 05/30/2021 ? ?PCP: [name], MD ? ? ? PT End of Session - 05/30/21 1306   ? ? Visit Number 16   ? Number of Visits 24   ? Date for PT Re-Evaluation 06/26/21   ? Authorization Type Medicare A&B   ? Progress Note Due on Visit 24   ? PT Start Time [phone]   ? PT Stop Time 1657   ? PT Time Calculation (min) 44 min   ? Activity Tolerance Patient tolerated treatment well   ? Behavior During Therapy [facility] for tasks assessed/performed   ? ?  ?  ? ?  ? ? ? ? ? ? ?Past Medical History:  ?Diagnosis Date  ? Arthritis   ? "thumbs, back" (02/16/2017)  ? Bradycardia 02/2017  ? Dyspnea   ? with exertion  ? Flu 2 weeks ago  ? GERD (gastroesophageal reflux disease)   ? Hepatitis A as child  ? HLD (hyperlipidemia)   ? Persistent atrial fibrillation ([place]) 12/18/2016  ? Pneumonia 1999; 2018  ? Presence of permanent cardiac pacemaker   ? st jude  ? Prostate cancer ([place])   ? ?Past Surgical History:  ?Procedure Laterality Date  ? APPENDECTOMY    ? age 52  ? CARDIOVERSION  02/16/2017  ? CARDIOVERSION N/A 02/16/2017  ? Procedure: CARDIOVERSION;  Surgeon: Thompson Grayer, MD;  Location: [place] [facility];  Service: Cardiovascular;  Laterality: N/A;  ? COLONOSCOPY WITH PROPOFOL N/A 12/13/2012  ? Procedure: COLONOSCOPY WITH PROPOFOL;  Surgeon: Garlan Fair, MD;  Location: [facility];  Service: Endoscopy;  Laterality: N/A;  ? FRACTURE SURGERY    ? HYDROCELE EXCISION Left 05/16/2018  ? Procedure: HYDROCELECTOMY ADULT;  Surgeon: [name], MD;  Location: [facility];  Service: Urology;  Laterality: Left;  ? INSERT / REPLACE / REMOVE PACEMAKER  02/16/2017  ? KYPHOPLASTY N/A 02/10/2021  ? Procedure: KYPHOPLASTY L-5;  Surgeon: Newman Pies, MD;  Location:  [place];  Service: Neurosurgery;  Laterality: N/A;  ? LAPAROSCOPY N/A 06/04/2020  ? Procedure: POSSIBLE LAPAROTOMY POSSIBLE BOWEL RESSECTION;  Surgeon: Clovis Riley, MD;  Location: [facility];  Service: General;  Laterality: N/A;  ? PACEMAKER IMPLANT N/A 02/16/2017  ? 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 PROSTATE BIOPSY  11/23/2019  ? WRIST FRACTURE SURGERY Left   ? with pins  ? ?Patient Active Problem List  ? Diagnosis Date Noted  ? Lumbar radiculopathy 05/02/2021  ? Vertebral compression fracture ([place]) 02/12/2021  ? Bilateral acetabular fractures, closed, initial encounter ([place]) 02/08/2021  ? Closed compression fracture of L5 lumbar vertebra, initial encounter ([place]) 02/08/2021  ? Back pain 02/06/2021  ? SBO (small bowel obstruction) ([place]) 06/01/2020  ? Pacemaker 05/13/2020  ? Malignant neoplasm of prostate ([place]) 01/09/2020  ? Annual physical exam 01/08/2020  ? Acute upper respiratory infection 01/08/2020  ? Asymptomatic varicose veins of lower extremity 01/08/2020  ? Bronchitis 01/08/2020  ? Cataract 01/08/2020  ? Constipation 01/08/2020  ? Diverticulosis of large intestine without perforation or abscess without bleeding 01/08/2020  ? Screening for gout 01/08/2020  ? False positive serological test for syphilis 01/08/2020  ? Gastro-esophageal reflux disease without esophagitis 01/08/2020  ? 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 History  of colonic polyps 01/08/2020  ? Impacted cerumen 01/08/2020  ? Insomnia 01/08/2020  ? Osteoarthritis 01/08/2020  ? Polypharmacy 01/08/2020  ? Pure hypercholesterolemia 01/08/2020  ? Rosacea 01/08/2020  ? Combined forms of age-related cataract of both eyes 07/13/2019  ? Elevated blood pressure reading 11/08/2018  ? Sick sinus syndrome ([place]) 02/16/2017  ? Persistent atrial fibrillation ([place]) 12/18/2016  ? 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 Nonexudative age-related macular degeneration, bilateral, early  dry stage 10/07/2015  ? Dermatochalasis of both upper eyelids 07/25/2014  ? Nuclear sclerosis of both eyes 07/25/2014  ? Bradycardia 05/29/2014  ? Premature atrial contractions 05/29/2014  ? Premature junctional contractions ([place]) 05/29/2014  ? Hyperlipidemia 05/29/2014  ? Encounter for general adult medical examination with abnormal findings 04/24/2014  ? Vitamin D deficiency 04/24/2014  ? ? ?REFERRING PROVIDER: Barbie Banner, PA-C  ?  ?REFERRING DIAG: M48.50XA (ICD-10-CM) - Collapsed vertebra, not elsewhere classified, site unspecified, initial encounter for fracture  ?  ?THERAPY DIAG:  ?Chronic low back pain without sciatica, unspecified back pain laterality ?  ?Muscle weakness (generalized) ?  ?Difficulty in walking, not elsewhere classified ?  ?ONSET DATE: Bilateral L5 kyphoplasty and vertebral body biopsies on 02/10/2021 ?  ?SUBJECTIVE:                                                                                                                                                                                          ?  ?SUBJECTIVE STATEMENT: ?Pt is 15 weeks and 4 days s/p bilat L5 kyphoplasty.  Pt states he felt good after prior Rx though tightness did return.  Pt reports he is walking less as directed by PT.  Pt states his legs feel "rubbery" and reports having weakness in his LE's. ?Pt states he hasn't been performing walking program and feels better.  Pt denies any adverse effects after prior Rx.  Pt states he doesn't have a sharp pain anymore.  He primarily has soreness than pain though does have pain after sitting at work.   ? ?He is not taking oxycodone.  Pt has improved with ambulation though is still limited with distance.  He has actually stopped his walking program recently and feels better.  He has more difficulty ambulating later in the day and after work.  He has increased pain and fatigue with ambulation.  Pt c/o's of weakness in pelvis and LE's.   ?  ?PERTINENT HISTORY:  ?02/10/2021 bilat L5  kyphoplasty,  bilat acetabular stress fractures and pubic rami stress Fx; Grade 1 anterolisthesis at L4-L5; PACEMAKER, A-fib, hx of prostate CA  ?  ?PAIN:  ?Are you having pain? Yes ?  NPRS scale: 3/10 though more soreness than pain ; 7/10 worst.   ?Pain location:  R SI area / lumbar  ?Aggravating factors: standing ?Relieving factors:  tylenol, oxycodone ?  ?PRECAUTIONS: Back--No bending, lifting > 10# , twisting ?  ?WEIGHT BEARING RESTRICTIONS No ?  ?  ?PATIENT GOALS to return to the gym; improve functional mobility ?  ?  ?OBJECTIVE:  ?  ?DIAGNOSTIC FINDINGS: (In Epic) ?-MRI on 02/07/2021 (prior to surgery) which showed Grade 1 anterolisthesis at L4-L5 and L5 inferior endplate compression fracture with less than 50% loss of height. ?-Earlier MRI showed Moderate osteoarthritis of bilateral hips and bilat acetabular stress/insufficiency fractures.  MRI indicated differential considerations include severe stress reaction possible developing insufficiency fracture versus underlying malignancy secondary to metastatic disease given the patient's history of prostate cancer. ?-MRI pelvis: 1. Bilateral acetabular/supra-acetabular stress fractures in the pelvis. There is likewise some stress fracture involving the left medial superior pubic ramus. This overall represents a progression compared to the previous unilateral left acetabular stress fracture shown on 11/04/2020. ? ? ? ?  ?TODAY'S TREATMENT  ? ?PHYSICAL PERFORMANCE TESTING: ? ? PATIENT SURVEYS:  ?FOTO 84 with a goal of 49 ; improved from prior score of 41 ? ? ?GAIT:   ?Pt ambulated with a good heel to toe gait pattern with no significant limp.  Pt slightly favoring R LE with increased stance on L. ? ?STRENGTH: ? Hip flexion:  R:  4+/5, L:  5/5 ? Hip abd:  4/5 bilat ? Knee extension:  5/5 bilat ? Knee flexion:  5/5 bilat ? ?PALPATION: ?TTP in lower lumbar paraspinals and SI area R > L.  Tender to palpate bilat glutes ? ?-Reviewed current function, pain level, HEP  compliance, and reported functional progress/deficits. ?-See below for pt education ? ? ?Therapeutic Exercise:  ?-Reviewed response to prior Rx. ?-Pt performed: ?       NuStep L5 (legs only) x 5 mins ? Stand

## 2021-05-30 ENCOUNTER — Encounter (HOSPITAL_BASED_OUTPATIENT_CLINIC_OR_DEPARTMENT_OTHER): Payer: Self-pay | Admitting: Physical Therapy

## 2021-06-02 ENCOUNTER — Ambulatory Visit (HOSPITAL_BASED_OUTPATIENT_CLINIC_OR_DEPARTMENT_OTHER): Payer: Medicare Other | Admitting: Physical Therapy

## 2021-06-02 ENCOUNTER — Encounter (HOSPITAL_BASED_OUTPATIENT_CLINIC_OR_DEPARTMENT_OTHER): Payer: Self-pay | Admitting: Physical Therapy

## 2021-06-02 ENCOUNTER — Other Ambulatory Visit: Payer: Self-pay

## 2021-06-02 DIAGNOSIS — M545 Low back pain, unspecified: Secondary | ICD-10-CM | POA: Diagnosis not present

## 2021-06-02 DIAGNOSIS — M6281 Muscle weakness (generalized): Secondary | ICD-10-CM

## 2021-06-02 DIAGNOSIS — G8929 Other chronic pain: Secondary | ICD-10-CM | POA: Diagnosis not present

## 2021-06-02 DIAGNOSIS — S32050G Wedge compression fracture of fifth lumbar vertebra, subsequent encounter for fracture with delayed healing: Secondary | ICD-10-CM | POA: Diagnosis not present

## 2021-06-02 DIAGNOSIS — R262 Difficulty in walking, not elsewhere classified: Secondary | ICD-10-CM | POA: Diagnosis not present

## 2021-06-02 NOTE — Therapy (Signed)
?OUTPATIENT PHYSICAL THERAPY TREATMENT NOTE ? ? ? ? ?Patient Name: Todd Chapman ?MRN: [id number] ?DOB:Aug 04, 1945, 76 y.o., male ?Today's Date: 06/02/2021 ? ?PCP: [name], MD ? ? ? PT End of Session - 06/02/21 1652   ? ? Visit Number 17   ? Number of Visits 26   ? Date for PT Re-Evaluation 07/04/21   ? Authorization Type Medicare A&B   ? Progress Note Due on Visit 26   ? PT Start Time 2683   ? PT Stop Time 4196   ? PT Time Calculation (min) 43 min   ? Activity Tolerance Patient tolerated treatment well   ? Behavior During Therapy [facility] for tasks assessed/performed   ? ?  ?  ? ?  ? ? ? ? ? ? ? ?Past Medical History:  ?Diagnosis Date  ? Arthritis   ? "thumbs, back" (02/16/2017)  ? Bradycardia 02/2017  ? Dyspnea   ? with exertion  ? Flu 2 weeks ago  ? GERD (gastroesophageal reflux disease)   ? Hepatitis A as child  ? HLD (hyperlipidemia)   ? Persistent atrial fibrillation ([place]) 12/18/2016  ? Pneumonia 1999; 2018  ? Presence of permanent cardiac pacemaker   ? st jude  ? Prostate cancer ([place])   ? ?Past Surgical History:  ?Procedure Laterality Date  ? APPENDECTOMY    ? age 54  ? CARDIOVERSION  02/16/2017  ? CARDIOVERSION N/A 02/16/2017  ? Procedure: CARDIOVERSION;  Surgeon: Thompson Grayer, MD;  Location: [place] [facility];  Service: Cardiovascular;  Laterality: N/A;  ? COLONOSCOPY WITH PROPOFOL N/A 12/13/2012  ? Procedure: COLONOSCOPY WITH PROPOFOL;  Surgeon: Garlan Fair, MD;  [facility];  Service: Endoscopy;  Laterality: N/A;  ? FRACTURE SURGERY    ? HYDROCELE EXCISION Left 05/16/2018  ? Procedure: HYDROCELECTOMY ADULT;  Surgeon: [name], MD;  Location: [facility];  Service: Urology;  Laterality: Left;  ? INSERT / REPLACE / REMOVE PACEMAKER  02/16/2017  ? KYPHOPLASTY N/A 02/10/2021  ? Procedure: KYPHOPLASTY L-5;  Surgeon: Newman Pies, MD;  Location: [place];  Service: Neurosurgery;  Laterality: N/A;  ? LAPAROSCOPY N/A 06/04/2020  ? Procedure: POSSIBLE LAPAROTOMY POSSIBLE  BOWEL RESSECTION;  Surgeon: Clovis Riley, MD;  [facility];  Service: General;  Laterality: N/A;  ? PACEMAKER IMPLANT N/A 02/16/2017  ? 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 PROSTATE BIOPSY  11/23/2019  ? WRIST FRACTURE SURGERY Left   ? with pins  ? ?Patient Active Problem List  ? Diagnosis Date Noted  ? Lumbar radiculopathy 05/02/2021  ? Vertebral compression fracture ([place]) 02/12/2021  ? Bilateral acetabular fractures, closed, initial encounter ([place]) 02/08/2021  ? Closed compression fracture of L5 lumbar vertebra, initial encounter ([place]) 02/08/2021  ? Back pain 02/06/2021  ? SBO (small bowel obstruction) ([place]) 06/01/2020  ? Pacemaker 05/13/2020  ? Malignant neoplasm of prostate ([place]) 01/09/2020  ? Annual physical exam 01/08/2020  ? Acute upper respiratory infection 01/08/2020  ? Asymptomatic varicose veins of lower extremity 01/08/2020  ? Bronchitis 01/08/2020  ? Cataract 01/08/2020  ? Constipation 01/08/2020  ? Diverticulosis of large intestine without perforation or abscess without bleeding 01/08/2020  ? Screening for gout 01/08/2020  ? False positive serological test for syphilis 01/08/2020  ? Gastro-esophageal reflux disease without esophagitis 01/08/2020  ? 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 History of colonic polyps 01/08/2020  ? Impacted cerumen 01/08/2020  ? Insomnia 01/08/2020  ? Osteoarthritis 01/08/2020  ?  Polypharmacy 01/08/2020  ? Pure hypercholesterolemia 01/08/2020  ? Rosacea 01/08/2020  ? Combined forms of age-related cataract of both eyes 07/13/2019  ? Elevated blood pressure reading 11/08/2018  ? Sick sinus syndrome ([place]) 02/16/2017  ? Persistent atrial fibrillation ([place]) 12/18/2016  ? 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 Nonexudative age-related macular degeneration, bilateral, 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  ? Dermatochalasis of both upper eyelids 07/25/2014  ? Nuclear sclerosis of both eyes 07/25/2014  ?  Bradycardia 05/29/2014  ? Premature atrial contractions 05/29/2014  ? Premature junctional contractions ([place]) 05/29/2014  ? Hyperlipidemia 05/29/2014  ? Encounter for general adult medical examination with abnormal findings 04/24/2014  ? Vitamin D deficiency 04/24/2014  ? ? ?REFERRING PROVIDER: Barbie Banner, PA-C  ?  ?REFERRING DIAG: M48.50XA (ICD-10-CM) - Collapsed vertebra, not elsewhere classified, site unspecified, initial encounter for fracture  ?  ?THERAPY DIAG:  ?Chronic low back pain without sciatica, unspecified back pain laterality ?  ?Muscle weakness (generalized) ?  ?Difficulty in walking, not elsewhere classified ?  ?ONSET DATE: Bilateral L5 kyphoplasty and vertebral body biopsies on 02/10/2021 ?  ?SUBJECTIVE:                                                                                                                                                                                          ?  ?SUBJECTIVE STATEMENT: ?Laying off the walking significantly helped with SI pain but the side effect of the mental aspect arose. I am still hurting in the same place-helped to exercise throughout the day but overall sometimes it is severe. Higher pain after sitting today to work. The seated exercises are helping.  ?  ?PERTINENT HISTORY:  ?02/10/2021 bilat L5 kyphoplasty,  bilat acetabular stress fractures and pubic rami stress Fx; Grade 1 anterolisthesis at L4-L5; PACEMAKER, A-fib, hx of prostate CA  ?  ?PAIN:  ?Are you having pain? Yes ?NPRS scale: 5/10  ?Pain location:  R SI area / lumbar  ?Aggravating factors: standing ?Relieving factors:  tylenol, oxycodone ?  ?PRECAUTIONS: Back--No bending, lifting > 10# , twisting ?  ?WEIGHT BEARING RESTRICTIONS No ?  ?  ?PATIENT GOALS to return to the gym; improve functional mobility ?  ?  ?OBJECTIVE:  ?  ?DIAGNOSTIC FINDINGS: (In Epic) ?-MRI on 02/07/2021 (prior to surgery) which showed Grade 1 anterolisthesis at L4-L5 and L5 inferior endplate compression fracture with  less than 50% loss of height. ?-Earlier MRI showed Moderate osteoarthritis of bilateral hips and bilat acetabular stress/insufficiency fractures.  MRI indicated differential considerations include severe stress reaction possible developing insufficiency fracture versus underlying malignancy secondary to metastatic disease given the patient's history of prostate  cancer. ?-MRI pelvis: 1. Bilateral acetabular/supra-acetabular stress fractures in the pelvis. There is likewise some stress fracture involving the left medial superior pubic ramus. This overall represents a progression compared to the previous unilateral left acetabular stress fracture shown on 11/04/2020. ? ? ? ?  ?TODAY'S TREATMENT  ? ?Gym machines:  ?Low impact cardio- nu step, xbike, elliptical ?Knee extension 25lb ?Knee flexion 35 lb ?Cable lat pull-seated and standing ?Discussed aquatic exercise & progression to find tolerance ? ? ? PATIENT SURVEYS:  ?FOTO 44 with a goal of 49 ; improved from prior score of 41 ? ? ?GAIT:   ?Pt ambulated with a good heel to toe gait pattern with no significant limp.  Pt slightly favoring R LE with increased stance on L. ? ?STRENGTH: ? Hip flexion:  R:  4+/5, L:  5/5 ? Hip abd:  4/5 bilat ? Knee extension:  5/5 bilat ? Knee flexion:  5/5 bilat ? ?PALPATION: ?TTP in lower lumbar paraspinals and SI area R > L.  Tender to palpate bilat glutes ? ?-Reviewed current function, pain level, HEP compliance, and reported functional progress/deficits. ?-See below for pt education ? ? ? ? ? ?   ?PATIENT EDUCATION:  ?Education details:  aquatic PT process and benefits.  Objective findings.  PT answered questions.  POC.  exercise form/rationale ?Education method: Explanation, demonstration, verbal cues, tactile cues ?Education comprehension: verbalized understanding and returned demonstration, verbal cues and tactile cues required ?  ?  ?HOME EXERCISE PROGRAM: ?Access Code: [email or web address] ?URL: [email or web address] ? ? ?   ?ASSESSMENT: ?  ?CLINICAL IMPRESSION: ?Overall pt is able to demonstrate excellent upright posture but still complaining of pain levels consistent with fractures in SIJ. Reviewed proper posture in gym machinery

## 2021-06-03 ENCOUNTER — Other Ambulatory Visit: Payer: Self-pay | Admitting: Internal Medicine

## 2021-06-03 DIAGNOSIS — E559 Vitamin D deficiency, unspecified: Secondary | ICD-10-CM

## 2021-06-04 ENCOUNTER — Encounter (HOSPITAL_BASED_OUTPATIENT_CLINIC_OR_DEPARTMENT_OTHER): Payer: Self-pay | Admitting: Physical Therapy

## 2021-06-04 ENCOUNTER — Other Ambulatory Visit: Payer: Self-pay

## 2021-06-04 ENCOUNTER — Ambulatory Visit (HOSPITAL_BASED_OUTPATIENT_CLINIC_OR_DEPARTMENT_OTHER): Payer: Medicare Other | Admitting: Physical Therapy

## 2021-06-04 DIAGNOSIS — R262 Difficulty in walking, not elsewhere classified: Secondary | ICD-10-CM | POA: Diagnosis not present

## 2021-06-04 DIAGNOSIS — M6281 Muscle weakness (generalized): Secondary | ICD-10-CM | POA: Diagnosis not present

## 2021-06-04 DIAGNOSIS — M545 Low back pain, unspecified: Secondary | ICD-10-CM

## 2021-06-04 DIAGNOSIS — G8929 Other chronic pain: Secondary | ICD-10-CM | POA: Diagnosis not present

## 2021-06-04 DIAGNOSIS — S32050G Wedge compression fracture of fifth lumbar vertebra, subsequent encounter for fracture with delayed healing: Secondary | ICD-10-CM

## 2021-06-04 NOTE — Therapy (Signed)
?OUTPATIENT PHYSICAL THERAPY TREATMENT NOTE ? ? ? ? ?Patient Name: Todd Chapman ?MRN: [id number] ?DOB:Jun 20, 1945, 76 y.o., male ?Today's Date: 06/04/2021 ? ?PCP: [name], MD ? ? ? PT End of Session - 06/04/21 0818   ? ? Visit Number 18   ? Number of Visits 26   ? Date for PT Re-Evaluation 07/04/21   ? Authorization Type Medicare A&B   ? Progress Note Due on Visit 26   ? PT Start Time 0818   ? PT Stop Time 0900   ? PT Time Calculation (min) 42 min   ? Activity Tolerance Patient tolerated treatment well   ? Behavior During Therapy [facility] for tasks assessed/performed   ? ?  ?  ? ?  ? ? ? ? ? ? ? ?Past Medical History:  ?Diagnosis Date  ? Arthritis   ? "thumbs, back" (02/16/2017)  ? Bradycardia 02/2017  ? Dyspnea   ? with exertion  ? Flu 2 weeks ago  ? GERD (gastroesophageal reflux disease)   ? Hepatitis A as child  ? HLD (hyperlipidemia)   ? Persistent atrial fibrillation ([place]) 12/18/2016  ? Pneumonia 1999; 2018  ? Presence of permanent cardiac pacemaker   ? st jude  ? Prostate cancer ([place])   ? ?Past Surgical History:  ?Procedure Laterality Date  ? APPENDECTOMY    ? age 28  ? CARDIOVERSION  02/16/2017  ? CARDIOVERSION N/A 02/16/2017  ? Procedure: CARDIOVERSION;  Surgeon: Thompson Grayer, MD;  Location: [place] [facility];  Service: Cardiovascular;  Laterality: N/A;  ? COLONOSCOPY WITH PROPOFOL N/A 12/13/2012  ? Procedure: COLONOSCOPY WITH PROPOFOL;  Surgeon: Garlan Fair, MD;  [facility]: [facility];  Service: Endoscopy;  Laterality: N/A;  ? FRACTURE SURGERY    ? HYDROCELE EXCISION Left 05/16/2018  ? Procedure: HYDROCELECTOMY ADULT;  Surgeon: [name], MD;  [facility]: [facility];  Service: Urology;  Laterality: Left;  ? INSERT / REPLACE / REMOVE PACEMAKER  02/16/2017  ? KYPHOPLASTY N/A 02/10/2021  ? Procedure: KYPHOPLASTY L-5;  Surgeon: Newman Pies, MD;  Location: [place];  Service: Neurosurgery;  Laterality: N/A;  ? LAPAROSCOPY N/A 06/04/2020  ? Procedure: POSSIBLE LAPAROTOMY POSSIBLE  BOWEL RESSECTION;  Surgeon: Clovis Riley, MD;  [facility]: [facility];  Service: General;  Laterality: N/A;  ? PACEMAKER IMPLANT N/A 02/16/2017  ? 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 PROSTATE BIOPSY  11/23/2019  ? WRIST FRACTURE SURGERY Left   ? with pins  ? ?Patient Active Problem List  ? Diagnosis Date Noted  ? Lumbar radiculopathy 05/02/2021  ? Vertebral compression fracture ([place]) 02/12/2021  ? Bilateral acetabular fractures, closed, initial encounter ([place]) 02/08/2021  ? Closed compression fracture of L5 lumbar vertebra, initial encounter ([place]) 02/08/2021  ? Back pain 02/06/2021  ? SBO (small bowel obstruction) ([place]) 06/01/2020  ? Pacemaker 05/13/2020  ? Malignant neoplasm of prostate ([place]) 01/09/2020  ? Annual physical exam 01/08/2020  ? Acute upper respiratory infection 01/08/2020  ? Asymptomatic varicose veins of lower extremity 01/08/2020  ? Bronchitis 01/08/2020  ? Cataract 01/08/2020  ? Constipation 01/08/2020  ? Diverticulosis of large intestine without perforation or abscess without bleeding 01/08/2020  ? Screening for gout 01/08/2020  ? False positive serological test for syphilis 01/08/2020  ? Gastro-esophageal reflux disease without esophagitis 01/08/2020  ? 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 History of colonic polyps 01/08/2020  ? Impacted cerumen 01/08/2020  ? Insomnia 01/08/2020  ? Osteoarthritis 01/08/2020  ?  Polypharmacy 01/08/2020  ? Pure hypercholesterolemia 01/08/2020  ? Rosacea 01/08/2020  ? Combined forms of age-related cataract of both eyes 07/13/2019  ? Elevated blood pressure reading 11/08/2018  ? Sick sinus syndrome ([place]) 02/16/2017  ? Persistent atrial fibrillation ([place]) 12/18/2016  ? 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 Nonexudative age-related macular degeneration, bilateral, 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  ? Dermatochalasis of both upper eyelids 07/25/2014  ? Nuclear sclerosis of both eyes 07/25/2014  ?  Bradycardia 05/29/2014  ? Premature atrial contractions 05/29/2014  ? Premature junctional contractions ([place]) 05/29/2014  ? Hyperlipidemia 05/29/2014  ? Encounter for general adult medical examination with abnormal findings 04/24/2014  ? Vitamin D deficiency 04/24/2014  ? ? ?REFERRING PROVIDER: Barbie Banner, PA-C  ?  ?REFERRING DIAG: M48.50XA (ICD-10-CM) - Collapsed vertebra, not elsewhere classified, site unspecified, initial encounter for fracture  ?  ?THERAPY DIAG:  ?Chronic low back pain without sciatica, unspecified back pain laterality ?  ?Muscle weakness (generalized) ?  ?Difficulty in walking, not elsewhere classified ?  ?ONSET DATE: Bilateral L5 kyphoplasty and vertebral body biopsies on 02/10/2021 ?  ?SUBJECTIVE:                                                                                                                                                                                          ?  ?SUBJECTIVE STATEMENT: ?Exercise really seems to decrease the pain. Worked out at [employer] yesterday without increased discomfort  today. ?  ?PERTINENT HISTORY:  ?02/10/2021 bilat L5 kyphoplasty,  bilat acetabular stress fractures and pubic rami stress Fx; Grade 1 anterolisthesis at L4-L5; PACEMAKER, A-fib, hx of prostate CA  ?  ?PAIN:  ?Are you having pain? Yes ?NPRS scale: 3/10  ?Pain location:  R SI area / lumbar  ?Aggravating factors: standing ?Relieving factors:  tylenol, oxycodone ?  ?PRECAUTIONS: Back--No bending, lifting > 10# , twisting ?  ?WEIGHT BEARING RESTRICTIONS No ?  ?  ?PATIENT GOALS to return to the gym; improve functional mobility ?  ?  ?OBJECTIVE:  ?  ?DIAGNOSTIC FINDINGS: (In Epic) ?-MRI on 02/07/2021 (prior to surgery) which showed Grade 1 anterolisthesis at L4-L5 and L5 inferior endplate compression fracture with less than 50% loss of height. ?-Earlier MRI showed Moderate osteoarthritis of bilateral hips and bilat acetabular stress/insufficiency fractures.  MRI indicated differential  considerations include severe stress reaction possible developing insufficiency fracture versus underlying malignancy secondary to metastatic disease given the patient's history of prostate cancer. ?-MRI pelvis: 1. Bilateral acetabular/supra-acetabular stress fractures in the pelvis. There is likewise some stress fracture involving the left medial superior pubic ramus. This overall represents a progression compared to the  previous unilateral left acetabular stress fracture shown on 11/04/2020. ? ? ? ?  ?TODAY'S TREATMENT  ? ? ?Pt seen for aquatic therapy today.  Treatment took place in water 3.25-4.8 ft in depth at the [employer] pool. Temp of water was 91?.  Pt entered/exited the pool via stairs step through pattern independently with bilat rail. ? Walking ?Seated stretching ?Seated flutter and add/abd 3x 20-25 reps  ?Hand buoy push down  ?Kick board push pull 3 x20 Feet together , last trail in tandem   ?Plank on bench hip extension x10 ?Plank on yellow noodle 2x 30s hold ?2 widths with yellow buoys submerged for core engage ? ? ?AquaticREHABdocumentation: Water will reduce edema around joints through hydrostatic pressure that allows for improved ROM, Water will allow for reduced gait deviation due to reduced joint loading through buoyancy to help patient improve posture without excess stress and pain. , Water will allow for work on balance using up thrust to improve posture. The principles of viscosity will help slow movement allowing for better processing time during fall recovery practice, Water will aid with movement using the current and laminar flow while the buoyancy reduces weight bearing, and Hydrostatic pressure also supports joints by unweighting joint load by at least 50 % in 3-4 feet depth water. 80% in chest to neck deep water.  ? ? ? ? PATIENT SURVEYS:  ?FOTO 50 with a goal of 49 ; improved from prior score of 41 ? ? ?GAIT:   ?Pt ambulated with a good heel to toe gait pattern with no  significant limp.  Pt slightly favoring R LE with increased stance on L. ? ?STRENGTH: ? Hip flexion:  R:  4+/5, L:  5/5 ? Hip abd:  4/5 bilat ? Knee extension:  5/5 bilat ? Knee flexion:  5/5 bilat ? ?PALPATION: ?TTP

## 2021-06-05 DIAGNOSIS — S32050D Wedge compression fracture of fifth lumbar vertebra, subsequent encounter for fracture with routine healing: Secondary | ICD-10-CM | POA: Diagnosis not present

## 2021-06-06 DIAGNOSIS — K219 Gastro-esophageal reflux disease without esophagitis: Secondary | ICD-10-CM | POA: Diagnosis not present

## 2021-06-06 DIAGNOSIS — E785 Hyperlipidemia, unspecified: Secondary | ICD-10-CM | POA: Diagnosis not present

## 2021-06-06 DIAGNOSIS — I4891 Unspecified atrial fibrillation: Secondary | ICD-10-CM | POA: Diagnosis not present

## 2021-06-07 DEATH — deceased

## 2021-06-08 NOTE — Therapy (Incomplete)
?OUTPATIENT PHYSICAL THERAPY TREATMENT NOTE ? ? ? ? ?Patient Name: Todd Chapman ?MRN: [id number] ?DOB:1945/08/25, 76 y.o., male ?Today's Date: 06/08/2021 ? ?PCP: [name], MD ? ? ? ? ? ? ? ? ? ? ?Past Medical History:  ?Diagnosis Date  ? Arthritis   ? "thumbs, back" (02/16/2017)  ? Bradycardia 02/2017  ? Dyspnea   ? with exertion  ? Flu 2 weeks ago  ? GERD (gastroesophageal reflux disease)   ? Hepatitis A as child  ? HLD (hyperlipidemia)   ? Persistent atrial fibrillation ([place]) 12/18/2016  ? Pneumonia 1999; 2018  ? Presence of permanent cardiac pacemaker   ? st jude  ? Prostate cancer ([place])   ? ?Past Surgical History:  ?Procedure Laterality Date  ? APPENDECTOMY    ? age 49  ? CARDIOVERSION  02/16/2017  ? CARDIOVERSION N/A 02/16/2017  ? Procedure: CARDIOVERSION;  Surgeon: Thompson Grayer, MD;  Location: [place] [facility];  Service: Cardiovascular;  Laterality: N/A;  ? COLONOSCOPY WITH PROPOFOL N/A 12/13/2012  ? Procedure: COLONOSCOPY WITH PROPOFOL;  Surgeon: Garlan Fair, MD;  [facility];  Service: Endoscopy;  Laterality: N/A;  ? FRACTURE SURGERY    ? HYDROCELE EXCISION Left 05/16/2018  ? Procedure: HYDROCELECTOMY ADULT;  Surgeon: [name], MD;  Location: [facility];  Service: Urology;  Laterality: Left;  ? INSERT / REPLACE / REMOVE PACEMAKER  02/16/2017  ? KYPHOPLASTY N/A 02/10/2021  ? Procedure: KYPHOPLASTY L-5;  Surgeon: Newman Pies, MD;  Location: [place];  Service: Neurosurgery;  Laterality: N/A;  ? LAPAROSCOPY N/A 06/04/2020  ? Procedure: POSSIBLE LAPAROTOMY POSSIBLE BOWEL RESSECTION;  Surgeon: Clovis Riley, MD;  [facility];  Service: General;  Laterality: N/A;  ? PACEMAKER IMPLANT N/A 02/16/2017  ? 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 PROSTATE BIOPSY  11/23/2019  ? WRIST FRACTURE SURGERY Left   ? with pins  ? ?Patient Active Problem List  ? Diagnosis Date Noted  ? Lumbar  radiculopathy 05/02/2021  ? Vertebral compression fracture ([place]) 02/12/2021  ? Bilateral acetabular fractures, closed, initial encounter ([place]) 02/08/2021  ? Closed compression fracture of L5 lumbar vertebra, initial encounter ([place]) 02/08/2021  ? Back pain 02/06/2021  ? SBO (small bowel obstruction) ([place]) 06/01/2020  ? Pacemaker 05/13/2020  ? Malignant neoplasm of prostate ([place]) 01/09/2020  ? Annual physical exam 01/08/2020  ? Acute upper respiratory infection 01/08/2020  ? Asymptomatic varicose veins of lower extremity 01/08/2020  ? Bronchitis 01/08/2020  ? Cataract 01/08/2020  ? Constipation 01/08/2020  ? Diverticulosis of large intestine without perforation or abscess without bleeding 01/08/2020  ? Screening for gout 01/08/2020  ? False positive serological test for syphilis 01/08/2020  ? Gastro-esophageal reflux disease without esophagitis 01/08/2020  ? 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 History of colonic polyps 01/08/2020  ? Impacted cerumen 01/08/2020  ? Insomnia 01/08/2020  ? Osteoarthritis 01/08/2020  ? Polypharmacy 01/08/2020  ? Pure hypercholesterolemia 01/08/2020  ? Rosacea 01/08/2020  ? Combined forms of age-related cataract of both eyes 07/13/2019  ? Elevated blood pressure reading 11/08/2018  ? Sick sinus syndrome ([place]) 02/16/2017  ? Persistent atrial fibrillation ([place]) 12/18/2016  ? 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 Nonexudative age-related macular degeneration, bilateral, 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  ? Dermatochalasis of both upper eyelids 07/25/2014  ? Nuclear sclerosis of both eyes 07/25/2014  ? Bradycardia 05/29/2014  ? Premature atrial contractions 05/29/2014  ? Premature junctional contractions ([place]) 05/29/2014  ?  Hyperlipidemia 05/29/2014  ? Encounter for general adult medical examination with abnormal findings 04/24/2014  ? Vitamin D deficiency 04/24/2014  ? ? ?REFERRING PROVIDER: Barbie Banner, PA-C  ?  ?REFERRING DIAG: M48.50XA (ICD-10-CM) - Collapsed vertebra, not elsewhere  classified, site unspecified, initial encounter for fracture  ?  ?THERAPY DIAG:  ?Chronic low back pain without sciatica, unspecified back pain laterality ?  ?Muscle weakness (generalized) ?  ?Difficulty in walking, not elsewhere classified ?  ?ONSET DATE: Bilateral L5 kyphoplasty and vertebral body biopsies on 02/10/2021 ?  ?SUBJECTIVE:                                                                                                                                                                                          ?  ?SUBJECTIVE STATEMENT: ?Exercise really seems to decrease the pain. Worked out at [employer] yesterday without increased discomfort  today. ?  ?PERTINENT HISTORY:  ?02/10/2021 bilat L5 kyphoplasty,  bilat acetabular stress fractures and pubic rami stress Fx; Grade 1 anterolisthesis at L4-L5; PACEMAKER, A-fib, hx of prostate CA  ?  ?PAIN:  ?Are you having pain? Yes ?NPRS scale: 3/10  ?Pain location:  R SI area / lumbar  ?Aggravating factors: standing ?Relieving factors:  tylenol, oxycodone ?  ?PRECAUTIONS: Back--No bending, lifting > 10# , twisting ?  ?WEIGHT BEARING RESTRICTIONS No ?  ?  ?PATIENT GOALS to return to the gym; improve functional mobility ?  ?  ?OBJECTIVE:  ?  ?DIAGNOSTIC FINDINGS: (In Epic) ?-MRI on 02/07/2021 (prior to surgery) which showed Grade 1 anterolisthesis at L4-L5 and L5 inferior endplate compression fracture with less than 50% loss of height. ?-Earlier MRI showed Moderate osteoarthritis of bilateral hips and bilat acetabular stress/insufficiency fractures.  MRI indicated differential considerations include severe stress reaction possible developing insufficiency fracture versus underlying malignancy secondary to metastatic disease given the patient's history of prostate cancer. ?-MRI pelvis: 1. Bilateral acetabular/supra-acetabular stress fractures in the pelvis. There is likewise some stress fracture involving the left medial superior pubic ramus. This overall represents a  progression compared to the previous unilateral left acetabular stress fracture shown on 11/04/2020. ? ? ? ?  ?TODAY'S TREATMENT  ? ? ?Pt seen for aquatic therapy today.  Treatment took place in water 3.25-4.8 ft in depth at the [employer] [facility] pool. Temp of water was 91?.  Pt entered/exited the pool via stairs step through pattern independently with bilat rail. ?Walking ?Seated stretching ?Seated flutter and add/abd 3x 20-25 reps  ?Hand buoy push down  ?Kick board push pull 3 x20 Feet together , last trail in tandem   ?Plank on bench hip extension x10 ?Plank on  yellow noodle 2x 30s hold ?2 widths with yellow buoys submerged for core engage ? ?Ambulating Forward, sidestepping,    Backward? ?Step ups ?Lateral step ups ?Tandem gait ?Shoulder extension with TrA with noodle ? ? ?AquaticREHABdocumentation: Water will reduce edema around joints through hydrostatic pressure that allows for improved ROM, Water will allow for reduced gait deviation due to reduced joint loading through buoyancy to help patient improve posture without excess stress and pain. , Water will allow for work on balance using up thrust to improve posture. The principles of viscosity will help slow movement allowing for better processing time during fall recovery practice, Water will aid with movement using the current and laminar flow while the buoyancy reduces weight bearing, and Hydrostatic pressure also supports joints by unweighting joint load by at least 50 % in 3-4 feet depth water. 80% in chest to neck deep water.  ? ? ? ? PATIENT SURVEYS:  ?FOTO 41 with a goal of 49 ; improved from prior score of 41 ? ? ?GAIT:   ?Pt ambulated with a good heel to toe gait pattern with no significant limp.  Pt slightly favoring R LE with increased stance on L. ? ?STRENGTH: ? Hip flexion:  R:  4+/5, L:  5/5 ? Hip abd:  4/5 bilat ? Knee extension:  5/5 bilat ? Knee flexion:  5/5 bilat ? ?PALPATION: ?TTP in lower lumbar paraspinals and SI area R > L.  Tender  to palpate bilat glutes ? ?-Reviewed current function, pain level, HEP compliance, and reported functional progress/deficits. ?-See below for pt education ? ? ? ? ? ?   ?PATIENT EDUCATION:  ?Education details:  aquatic

## 2021-06-09 ENCOUNTER — Inpatient Hospital Stay (HOSPITAL_BASED_OUTPATIENT_CLINIC_OR_DEPARTMENT_OTHER)
Admission: EM | Admit: 2021-06-09 | Discharge: 2021-06-11 | DRG: 871 | Disposition: A | Discharge status: Home / Self Care | Payer: Medicare Other | Attending: Internal Medicine | Admitting: Internal Medicine

## 2021-06-09 ENCOUNTER — Emergency Department (HOSPITAL_BASED_OUTPATIENT_CLINIC_OR_DEPARTMENT_OTHER): Payer: Medicare Other

## 2021-06-09 ENCOUNTER — Ambulatory Visit (HOSPITAL_BASED_OUTPATIENT_CLINIC_OR_DEPARTMENT_OTHER): Payer: Medicare Other | Admitting: Physical Therapy

## 2021-06-09 ENCOUNTER — Other Ambulatory Visit: Payer: Self-pay

## 2021-06-09 ENCOUNTER — Encounter (HOSPITAL_BASED_OUTPATIENT_CLINIC_OR_DEPARTMENT_OTHER): Payer: Self-pay

## 2021-06-09 ENCOUNTER — Encounter (HOSPITAL_BASED_OUTPATIENT_CLINIC_OR_DEPARTMENT_OTHER): Payer: Self-pay | Admitting: Emergency Medicine

## 2021-06-09 DIAGNOSIS — J189 Pneumonia, unspecified organism: Secondary | ICD-10-CM | POA: Diagnosis present

## 2021-06-09 DIAGNOSIS — M545 Low back pain, unspecified: Secondary | ICD-10-CM | POA: Diagnosis not present

## 2021-06-09 DIAGNOSIS — Z950 Presence of cardiac pacemaker: Secondary | ICD-10-CM | POA: Diagnosis present

## 2021-06-09 DIAGNOSIS — Z8701 Personal history of pneumonia (recurrent): Secondary | ICD-10-CM

## 2021-06-09 DIAGNOSIS — Z8619 Personal history of other infectious and parasitic diseases: Secondary | ICD-10-CM

## 2021-06-09 DIAGNOSIS — K219 Gastro-esophageal reflux disease without esophagitis: Secondary | ICD-10-CM | POA: Diagnosis present

## 2021-06-09 DIAGNOSIS — K5903 Drug induced constipation: Secondary | ICD-10-CM

## 2021-06-09 DIAGNOSIS — Z8719 Personal history of other diseases of the digestive system: Secondary | ICD-10-CM

## 2021-06-09 DIAGNOSIS — Z87891 Personal history of nicotine dependence: Secondary | ICD-10-CM

## 2021-06-09 DIAGNOSIS — K828 Other specified diseases of gallbladder: Secondary | ICD-10-CM | POA: Diagnosis not present

## 2021-06-09 DIAGNOSIS — R0602 Shortness of breath: Secondary | ICD-10-CM | POA: Diagnosis not present

## 2021-06-09 DIAGNOSIS — Z8249 Family history of ischemic heart disease and other diseases of the circulatory system: Secondary | ICD-10-CM | POA: Diagnosis not present

## 2021-06-09 DIAGNOSIS — A419 Sepsis, unspecified organism: Secondary | ICD-10-CM | POA: Diagnosis not present

## 2021-06-09 DIAGNOSIS — M1909 Primary osteoarthritis, other specified site: Secondary | ICD-10-CM | POA: Diagnosis not present

## 2021-06-09 DIAGNOSIS — I495 Sick sinus syndrome: Secondary | ICD-10-CM | POA: Diagnosis present

## 2021-06-09 DIAGNOSIS — I4819 Other persistent atrial fibrillation: Secondary | ICD-10-CM | POA: Diagnosis present

## 2021-06-09 DIAGNOSIS — R6520 Severe sepsis without septic shock: Secondary | ICD-10-CM | POA: Diagnosis not present

## 2021-06-09 DIAGNOSIS — Z7901 Long term (current) use of anticoagulants: Secondary | ICD-10-CM | POA: Diagnosis not present

## 2021-06-09 DIAGNOSIS — E785 Hyperlipidemia, unspecified: Secondary | ICD-10-CM | POA: Diagnosis present

## 2021-06-09 DIAGNOSIS — J188 Other pneumonia, unspecified organism: Secondary | ICD-10-CM | POA: Diagnosis present

## 2021-06-09 DIAGNOSIS — Z20822 Contact with and (suspected) exposure to covid-19: Secondary | ICD-10-CM | POA: Diagnosis present

## 2021-06-09 DIAGNOSIS — R945 Abnormal results of liver function studies: Secondary | ICD-10-CM | POA: Diagnosis not present

## 2021-06-09 DIAGNOSIS — I4891 Unspecified atrial fibrillation: Secondary | ICD-10-CM | POA: Diagnosis not present

## 2021-06-09 DIAGNOSIS — G8929 Other chronic pain: Secondary | ICD-10-CM

## 2021-06-09 DIAGNOSIS — K5730 Diverticulosis of large intestine without perforation or abscess without bleeding: Secondary | ICD-10-CM | POA: Diagnosis not present

## 2021-06-09 DIAGNOSIS — R195 Other fecal abnormalities: Secondary | ICD-10-CM | POA: Diagnosis not present

## 2021-06-09 DIAGNOSIS — Z79899 Other long term (current) drug therapy: Secondary | ICD-10-CM | POA: Diagnosis not present

## 2021-06-09 DIAGNOSIS — M549 Dorsalgia, unspecified: Secondary | ICD-10-CM | POA: Diagnosis present

## 2021-06-09 DIAGNOSIS — R509 Fever, unspecified: Secondary | ICD-10-CM | POA: Diagnosis not present

## 2021-06-09 DIAGNOSIS — K769 Liver disease, unspecified: Secondary | ICD-10-CM

## 2021-06-09 DIAGNOSIS — J9601 Acute respiratory failure with hypoxia: Secondary | ICD-10-CM | POA: Diagnosis present

## 2021-06-09 DIAGNOSIS — K5900 Constipation, unspecified: Secondary | ICD-10-CM | POA: Diagnosis present

## 2021-06-09 DIAGNOSIS — Z8546 Personal history of malignant neoplasm of prostate: Secondary | ICD-10-CM

## 2021-06-09 LAB — CBC WITH DIFFERENTIAL/PLATELET
Abs Immature Granulocytes: 0.01 10*3/uL (ref 0.00–0.07)
Basophils Absolute: 0 10*3/uL (ref 0.0–0.1)
Basophils Relative: 0 %
Eosinophils Absolute: 0 10*3/uL (ref 0.0–0.5)
Eosinophils Relative: 0 %
HCT: 34.5 % — ABNORMAL LOW (ref 39.0–52.0)
Hemoglobin: 11.4 g/dL — ABNORMAL LOW (ref 13.0–17.0)
Immature Granulocytes: 0 %
Lymphocytes Relative: 6 %
Lymphs Abs: 0.2 10*3/uL — ABNORMAL LOW (ref 0.7–4.0)
MCH: 29.6 pg (ref 26.0–34.0)
MCHC: 33 g/dL (ref 30.0–36.0)
MCV: 89.6 fL (ref 80.0–100.0)
Monocytes Absolute: 0.3 10*3/uL (ref 0.1–1.0)
Monocytes Relative: 8 %
Neutro Abs: 3.2 10*3/uL (ref 1.7–7.7)
Neutrophils Relative %: 86 %
Platelets: 184 10*3/uL (ref 150–400)
RBC: 3.85 MIL/uL — ABNORMAL LOW (ref 4.22–5.81)
RDW: 14.1 % (ref 11.5–15.5)
WBC: 3.8 10*3/uL — ABNORMAL LOW (ref 4.0–10.5)
nRBC: 0 % (ref 0.0–0.2)

## 2021-06-09 LAB — PROCALCITONIN: Procalcitonin: 3.74 ng/mL

## 2021-06-09 LAB — COMPREHENSIVE METABOLIC PANEL
ALT: 18 U/L (ref 0–44)
AST: 28 U/L (ref 15–41)
Albumin: 4.3 g/dL (ref 3.5–5.0)
Alkaline Phosphatase: 95 U/L (ref 38–126)
Anion gap: 10 (ref 5–15)
BUN: 24 mg/dL — ABNORMAL HIGH (ref 8–23)
CO2: 24 mmol/L (ref 22–32)
Calcium: 10.4 mg/dL — ABNORMAL HIGH (ref 8.9–10.3)
Chloride: 103 mmol/L (ref 98–111)
Creatinine, Ser: 0.78 mg/dL (ref 0.61–1.24)
GFR, Estimated: >60 mL/min (ref >60)
Glucose, Bld: 119 mg/dL — ABNORMAL HIGH (ref 70–99)
Potassium: 4.3 mmol/L (ref 3.5–5.1)
Sodium: 137 mmol/L (ref 135–145)
Total Bilirubin: 1.4 mg/dL — ABNORMAL HIGH (ref 0.3–1.2)
Total Protein: 7 g/dL (ref 6.5–8.1)

## 2021-06-09 LAB — URINALYSIS, ROUTINE W REFLEX MICROSCOPIC
Bilirubin Urine: NEGATIVE
Glucose, UA: NEGATIVE mg/dL
Hgb urine dipstick: NEGATIVE
Ketones, ur: NEGATIVE mg/dL
Leukocytes,Ua: NEGATIVE
Nitrite: NEGATIVE
Specific Gravity, Urine: 1.02 (ref 1.005–1.030)
pH: 7 (ref 5.0–8.0)

## 2021-06-09 LAB — BRAIN NATRIURETIC PEPTIDE: B Natriuretic Peptide: 222.9 pg/mL — ABNORMAL HIGH (ref 0.0–100.0)

## 2021-06-09 LAB — RESP PANEL BY RT-PCR (FLU A&B, COVID) ARPGX2
Influenza A by PCR: NEGATIVE
Influenza B by PCR: NEGATIVE
SARS Coronavirus 2 by RT PCR: NEGATIVE

## 2021-06-09 LAB — LACTIC ACID, PLASMA
Lactic Acid, Venous: 2.2 mmol/L (ref 0.5–1.9)
Lactic Acid, Venous: 2 mmol/L (ref 0.5–1.9)

## 2021-06-09 IMAGING — CT CT ABD-PELV W/O CM
2 of 4 series · 15 of 46 positions shown, 17 images · non-contrast
Comparison: CT examination dated [DATE]

CLINICAL DATA: Bowel obstruction suspected.  Fever, vomiting.



[Series 3: abd pel wo · axial · 0.76mm/px · z∈[+834,+1299]mm · 12 of 103 slices shown, 14 images]
[im 5/103  soft-tissue]
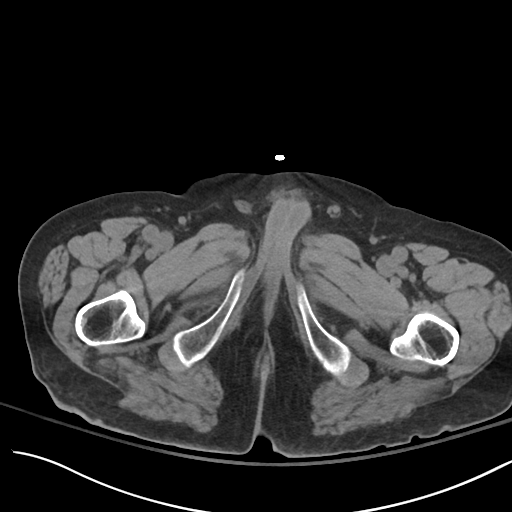
[im 5/103  bone]
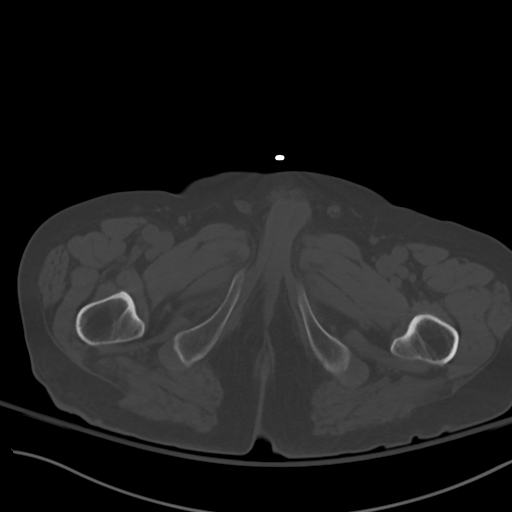
[im 15/103  soft-tissue]
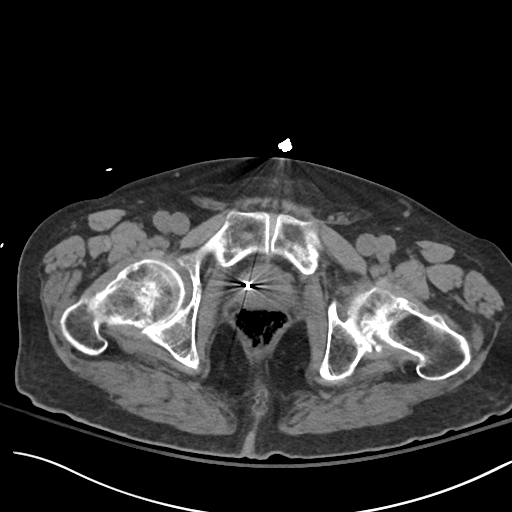
[im 25/103  soft-tissue]
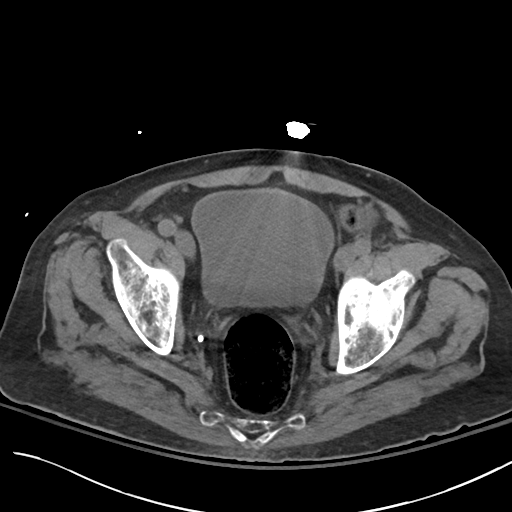
[im 30/103  soft-tissue]
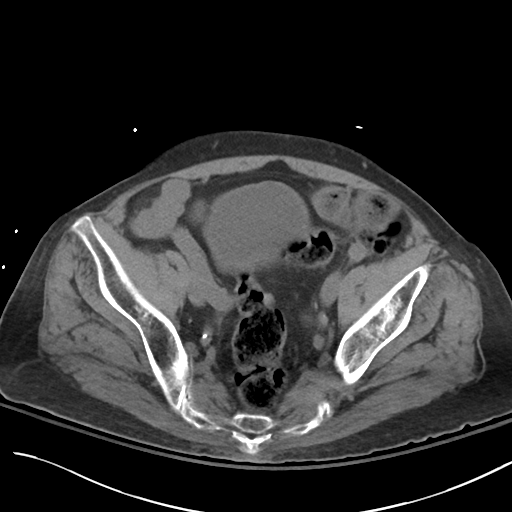
[im 39/103  soft-tissue]
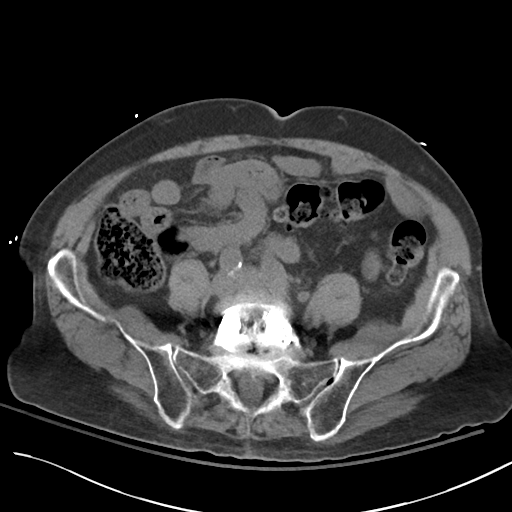
[im 49/103  soft-tissue]
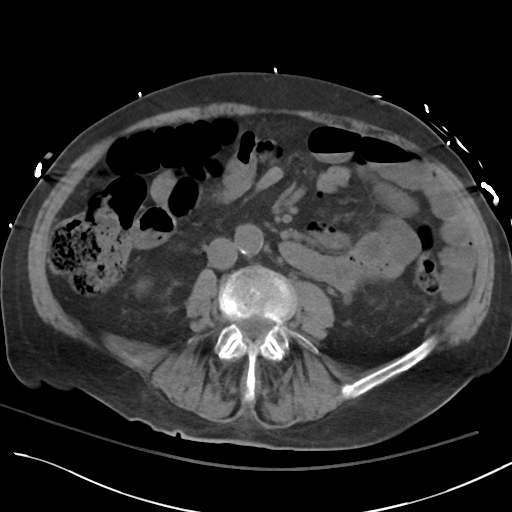
[im 54/103  soft-tissue]
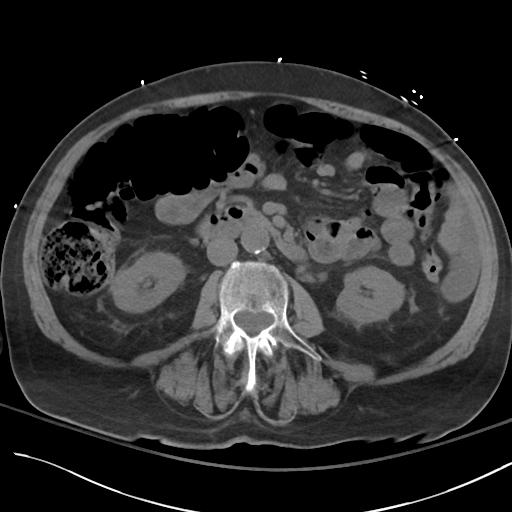
[im 64/103  soft-tissue]
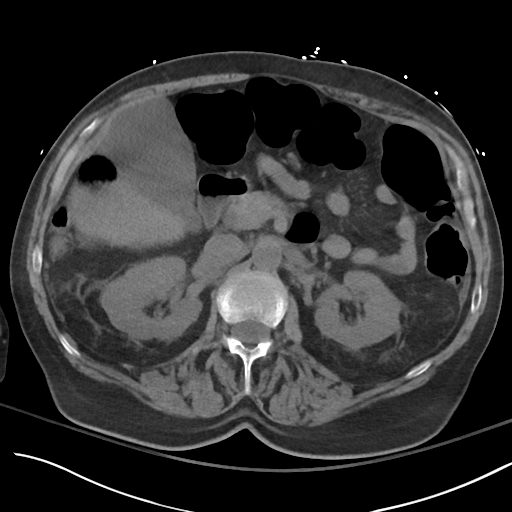
[im 73/103  soft-tissue]
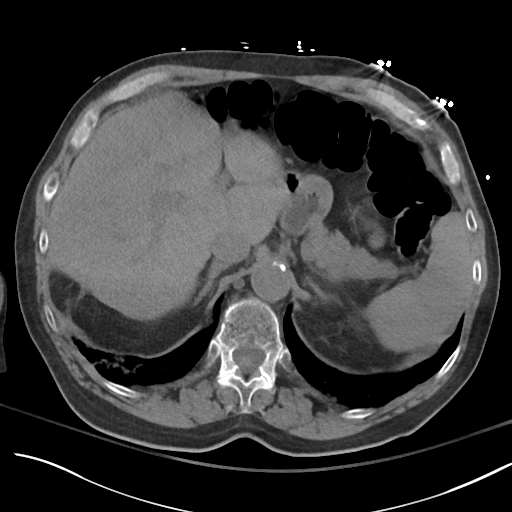
[im 73/103  bone]
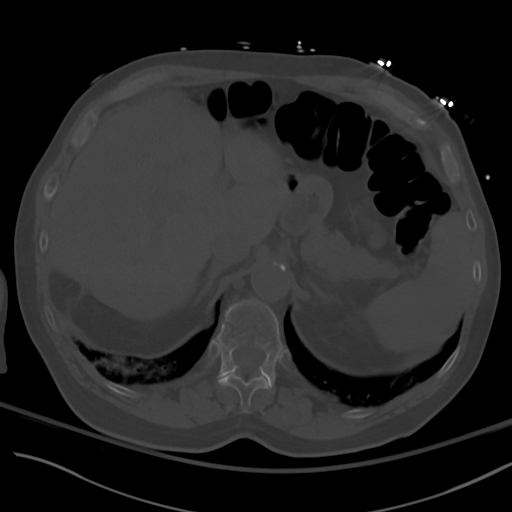
[im 78/103  soft-tissue]
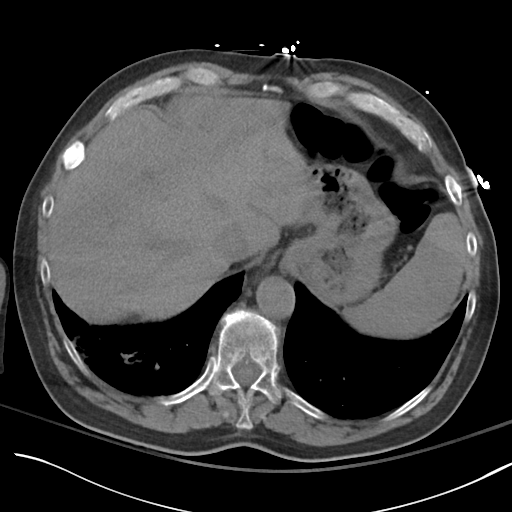
[im 88/103  soft-tissue]
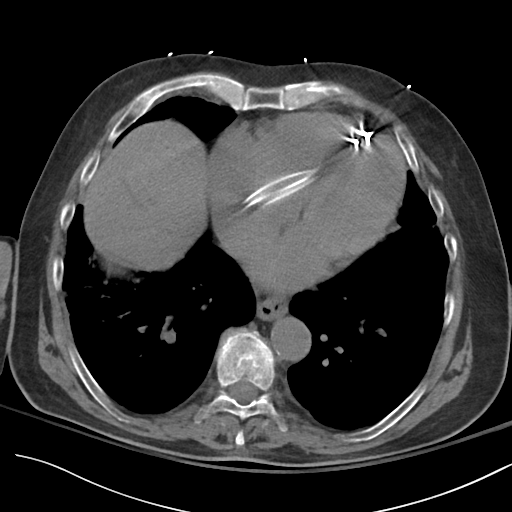
[im 98/103  soft-tissue]
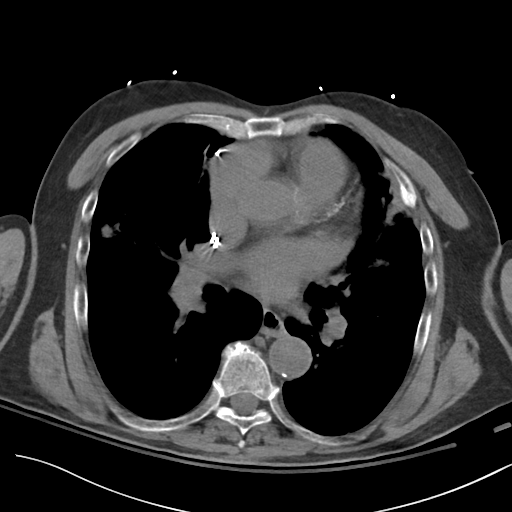

[Series 6: coronal · coronal · 0.84mm/px · 3 of 113 slices shown]
[im 38/113  soft-tissue]
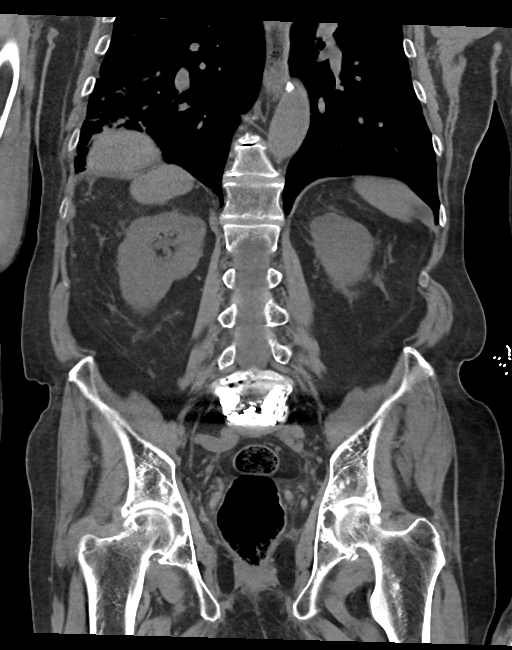
[im 50/113  soft-tissue]
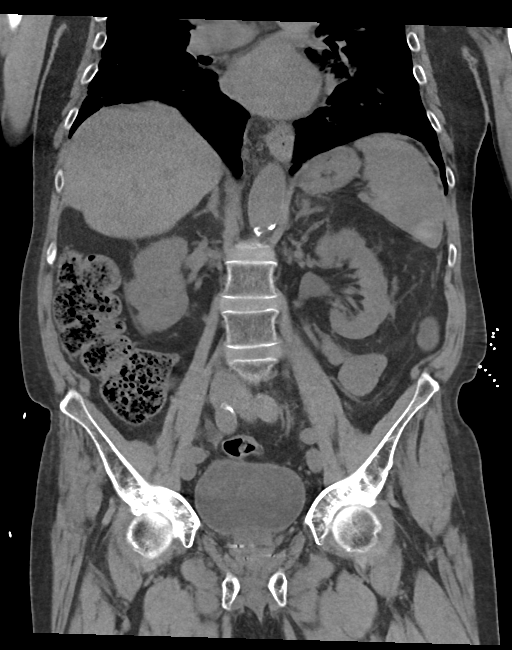
[im 63/113  soft-tissue]
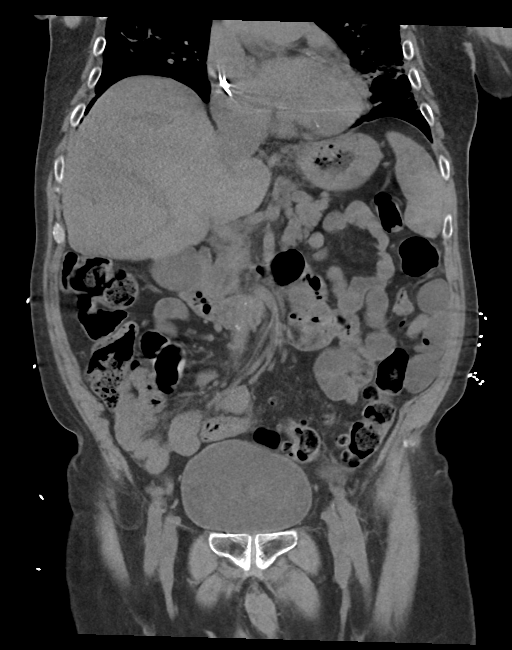

[15 of 46 positions shown; findings below may reference images not displayed]

FINDINGS: Lower chest: Reticulonodular and patchy opacities in the anterior
aspect of the left upper, right middle and right lower lobes
concerning for pneumonia.

Hepatobiliary: No focal liver abnormality is seen. No gallstones,
gallbladder wall thickening, or biliary dilatation. Gallbladder is
markedly distended.

Pancreas: Unremarkable. No pancreatic ductal dilatation or
surrounding inflammatory changes.

Spleen: Normal in size without focal abnormality.

Adrenals/Urinary Tract: Adrenal glands are unremarkable. Kidneys are
normal, without renal calculi, focal lesion, or hydronephrosis.
Bladder is unremarkable.

Stomach/Bowel: Stomach is within normal limits. Appendix not
visualized. No evidence of bowel wall thickening, distention, or
inflammatory changes. Scattered colonic diverticula without evidence
of acute diverticulitis. Moderate amount of stool in the distal
colon with gaseous distention of the proximal colon concerning for
constipation.

Vascular/Lymphatic: Aortic atherosclerosis. No enlarged abdominal or
pelvic lymph nodes.

Reproductive: Brachytherapy seeds in the prostate.

Other: No abdominal wall hernia or abnormality. No abdominopelvic
ascites.

Musculoskeletal: Multilevel degenerative disc disease of the lumbar
spine. Vertebroplasty of L5 vertebral body.
IMPRESSION: 1. Multiple reticulonodular and patchy opacities in bilateral lungs
concerning for pneumonia. Follow-up examination to resolution is
recommended.

2. Bowel loops are normal in caliber. Retained colonic stool
suggesting constipation. Scattered colonic diverticula without
evidence of acute diverticulitis.

3. No evidence of cholelithiasis, gallbladder is however distended.
Right upper quadrant sonogram could be considered for further
evaluation if clinically warranted.

4.  Additional chronic findings as above.

## 2021-06-09 IMAGING — DX DG CHEST 1V PORT
1 series · 1 of 1 positions shown · non-contrast
Comparison: [DATE]

CLINICAL DATA: Fever, shortness of breath

EXAM:
PORTABLE CHEST 1 VIEW

[chest ap]
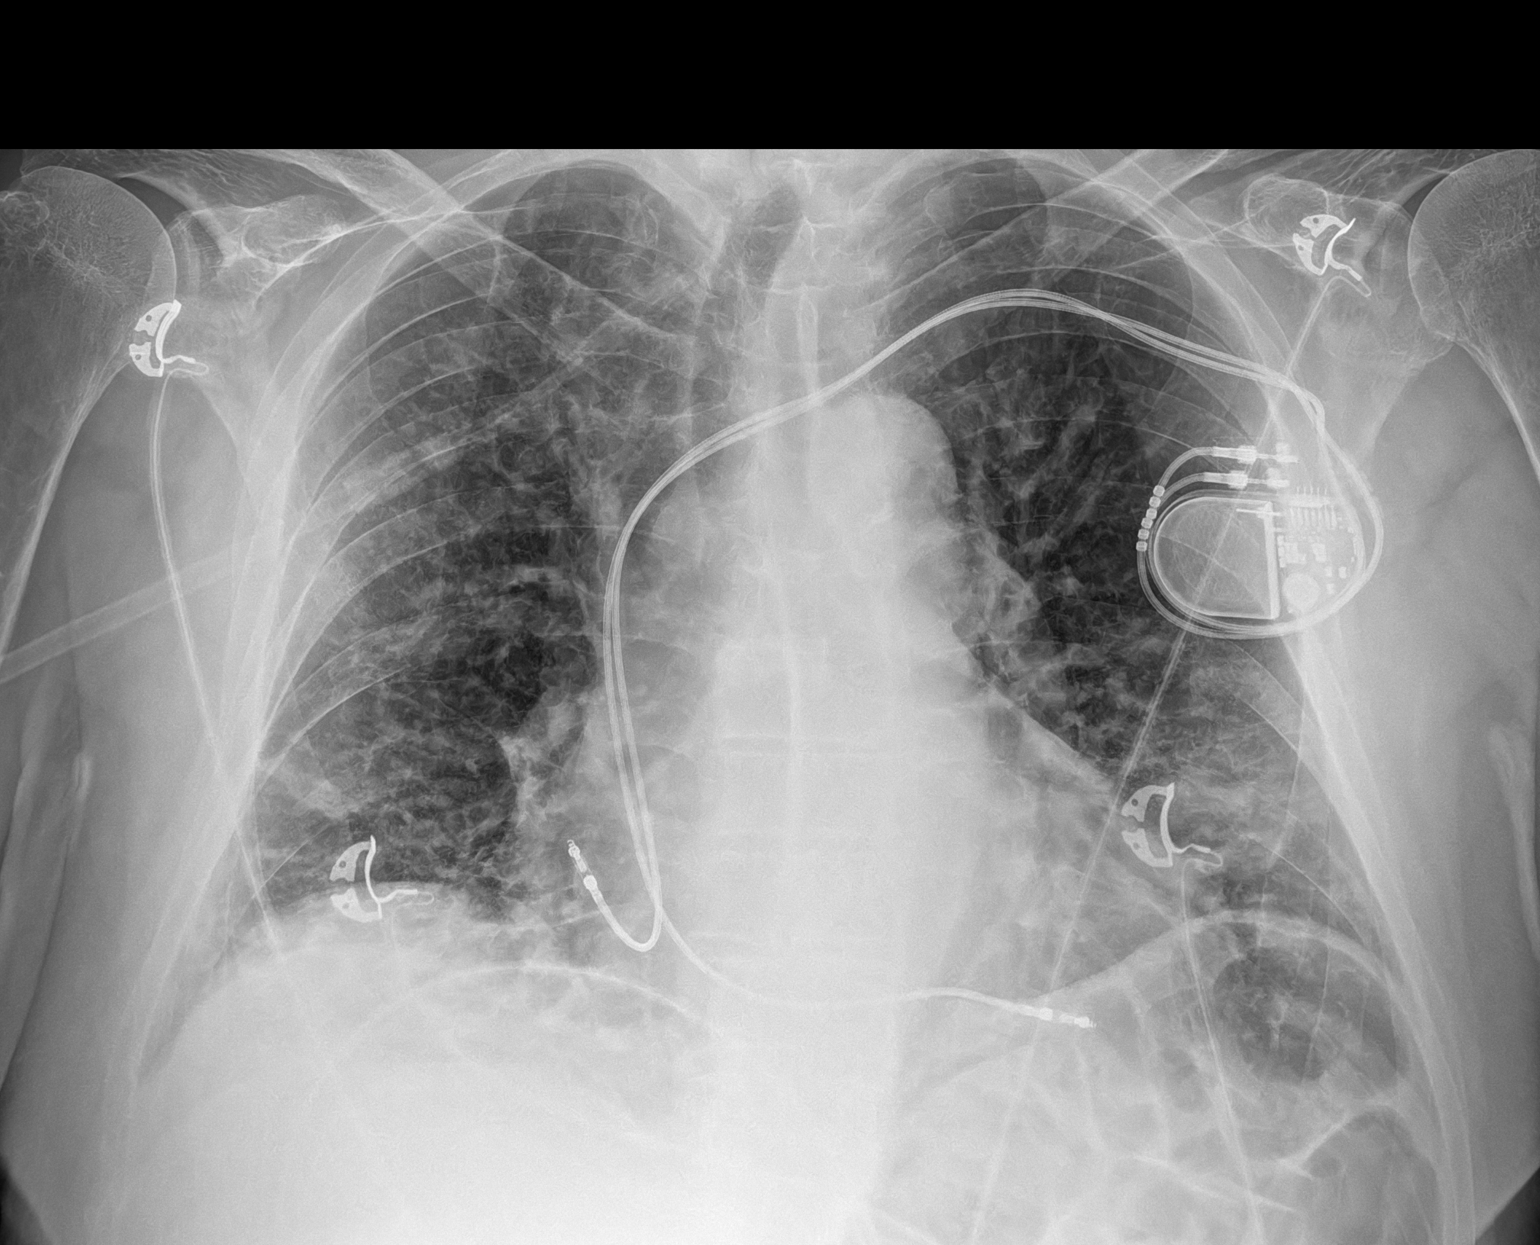

[1 of 1 positions shown; findings below may reference images not displayed]

FINDINGS: Left-sided implanted cardiac device. Stable cardiomediastinal
contours. Coarsened interstitial markings bilaterally. Streaky
bibasilar opacities with more focal airspace opacity in the left
lower lobe. No pleural effusion or pneumothorax.
IMPRESSION: Streaky bibasilar opacities with more focal airspace opacity in the
left lower lobe, suspicious for pneumonia.

## 2021-06-09 MED ORDER — SENNA 8.6 MG PO TABS
1.0000 | ORAL_TABLET | Freq: Every day | ORAL | Status: DC
  Administered 2021-06-10 – 2021-06-11 (×2): 8.6 mg via ORAL
  Filled 2021-06-09 (×2): qty 1

## 2021-06-09 MED ORDER — LACTATED RINGERS IV BOLUS (SEPSIS)
1000.0000 mL | Freq: Once | INTRAVENOUS | Status: AC
  Administered 2021-06-09: 1000 mL via INTRAVENOUS

## 2021-06-09 MED ORDER — ACETAMINOPHEN 325 MG PO TABS
650.0000 mg | ORAL_TABLET | Freq: Four times a day (QID) | ORAL | Status: DC | PRN
  Administered 2021-06-10 – 2021-06-11 (×5): 650 mg via ORAL
  Filled 2021-06-09 (×5): qty 2

## 2021-06-09 MED ORDER — BISACODYL 10 MG RE SUPP: 10.0000 mg | Freq: Every day | RECTAL | Status: DC | PRN

## 2021-06-09 MED ORDER — POLYETHYLENE GLYCOL 3350 17 G PO PACK
17.0000 g | PACK | Freq: Every day | ORAL | Status: DC
  Administered 2021-06-10 – 2021-06-11 (×2): 17 g via ORAL
  Filled 2021-06-09 (×2): qty 1

## 2021-06-09 MED ORDER — POLYVINYL ALCOHOL 1.4 % OP SOLN
Freq: Every day | OPHTHALMIC | Status: DC | PRN
Start: 2021-06-09 — End: 2021-06-11

## 2021-06-09 MED ORDER — SODIUM CHLORIDE 0.9 % IV SOLN
2.0000 g | INTRAVENOUS | Status: DC
  Administered 2021-06-09 – 2021-06-10 (×2): 2 g via INTRAVENOUS
  Filled 2021-06-09 (×3): qty 20

## 2021-06-09 MED ORDER — SALINE SPRAY 0.65 % NA SOLN
1.0000 | Freq: Every day | NASAL | Status: DC
  Administered 2021-06-09 – 2021-06-10 (×2): 1 via NASAL
  Filled 2021-06-09: qty 44

## 2021-06-09 MED ORDER — ADULT MULTIVITAMIN W/MINERALS CH
1.0000 | ORAL_TABLET | Freq: Every day | ORAL | Status: DC
  Administered 2021-06-10 – 2021-06-11 (×2): 1 via ORAL
  Filled 2021-06-09 (×2): qty 1

## 2021-06-09 MED ORDER — ROSUVASTATIN CALCIUM 10 MG PO TABS
10.0000 mg | ORAL_TABLET | Freq: Every morning | ORAL | Status: DC
  Administered 2021-06-10 – 2021-06-11 (×2): 10 mg via ORAL
  Filled 2021-06-09 (×2): qty 1

## 2021-06-09 MED ORDER — TAMSULOSIN HCL 0.4 MG PO CAPS
0.4000 mg | ORAL_CAPSULE | Freq: Every day | ORAL | Status: DC
  Administered 2021-06-10: 0.4 mg via ORAL
  Filled 2021-06-09: qty 1

## 2021-06-09 MED ORDER — BISACODYL 5 MG PO TBEC: 5.0000 mg | DELAYED_RELEASE_TABLET | Freq: Every day | ORAL | Status: DC | PRN

## 2021-06-09 MED ORDER — SODIUM CHLORIDE 0.9 % IV SOLN
500.0000 mg | INTRAVENOUS | Status: DC
  Administered 2021-06-09 – 2021-06-10 (×2): 500 mg via INTRAVENOUS
  Filled 2021-06-09 (×3): qty 5

## 2021-06-09 MED ORDER — LIDOCAINE 5 % EX PTCH
1.0000 | MEDICATED_PATCH | CUTANEOUS | Status: DC
  Administered 2021-06-09 – 2021-06-10 (×2): 1 via TRANSDERMAL
  Filled 2021-06-09 (×2): qty 1

## 2021-06-09 MED ORDER — PANTOPRAZOLE SODIUM 40 MG PO TBEC
40.0000 mg | DELAYED_RELEASE_TABLET | Freq: Every day | ORAL | Status: DC
  Administered 2021-06-10 – 2021-06-11 (×2): 40 mg via ORAL
  Filled 2021-06-09 (×2): qty 1

## 2021-06-09 MED ORDER — APIXABAN 5 MG PO TABS
5.0000 mg | ORAL_TABLET | Freq: Two times a day (BID) | ORAL | Status: DC
  Administered 2021-06-09 – 2021-06-10 (×2): 5 mg via ORAL
  Filled 2021-06-09 (×2): qty 1

## 2021-06-09 MED ORDER — ONDANSETRON HCL 4 MG/2ML IJ SOLN
4.0000 mg | Freq: Once | INTRAMUSCULAR | Status: AC
  Administered 2021-06-09: 4 mg via INTRAVENOUS
  Filled 2021-06-09: qty 2

## 2021-06-09 MED ORDER — MELOXICAM 7.5 MG PO TABS
3.7500 mg | ORAL_TABLET | Freq: Every day | ORAL | Status: DC | PRN
  Filled 2021-06-09: qty 1

## 2021-06-09 MED ORDER — HYDROCODONE-ACETAMINOPHEN 5-325 MG PO TABS
1.0000 | ORAL_TABLET | Freq: Four times a day (QID) | ORAL | Status: DC | PRN
  Administered 2021-06-09 – 2021-06-11 (×4): 1 via ORAL
  Filled 2021-06-09 (×4): qty 1

## 2021-06-09 MED ORDER — CALCIUM CARBONATE ANTACID 500 MG PO CHEW: 2.0000 | CHEWABLE_TABLET | Freq: Every day | ORAL | Status: DC | PRN

## 2021-06-09 MED ORDER — LIDOCAINE 5 % EX CREA: 1.0000 "application " | TOPICAL_CREAM | Freq: Every day | CUTANEOUS | Status: DC | PRN

## 2021-06-09 NOTE — ED Notes (Signed)
Patient bladder scan for 420 ml in bladder.  I/O cath for 650 ml.  Tolerated well ?

## 2021-06-09 NOTE — H&P (Signed)
?History and Physical  ? ? ?Patient: Todd Chapman:[id number] DOB: December 15, 1945 ?DOA: 06/09/2021 ?DOS: the patient was seen and examined on 06/09/2021 ?PCP: [name], MD  ?Patient coming from: Home ? ?Chief Complaint:  ?Chief Complaint  ?Patient presents with  ? Fever  ? ?HPI: Todd Chapman is a 76 y.o. male with medical history significant of A.Fib on eliquis, SSS s/p PPM, prostate CA. ? ?Pt presents to ED at [facility] with c/o fever, cough, congestion. ? ?Onset yesterday afternoon and persistent throughout last night. ? ?Tm 103 at home ? ?2 episodes of vomiting ? ?Diarrhea earlier in week but took imodium and now no BM for past 2-3 days. ? ?No CP. ? ?Does have SOB. ?  ?Review of Systems: As mentioned in the history of present illness. All other systems reviewed and are negative. ?Past Medical History:  ?Diagnosis Date  ? Arthritis   ? "thumbs, back" (02/16/2017)  ? Bradycardia 02/2017  ? Dyspnea   ? with exertion  ? Flu 2 weeks ago  ? GERD (gastroesophageal reflux disease)   ? Hepatitis A as child  ? HLD (hyperlipidemia)   ? Persistent atrial fibrillation ([place]) 12/18/2016  ? Pneumonia 1999; 2018  ? Presence of permanent cardiac pacemaker   ? st jude  ? Prostate cancer ([place])   ? ?Past Surgical History:  ?Procedure Laterality Date  ? APPENDECTOMY    ? age 46  ? CARDIOVERSION  02/16/2017  ? CARDIOVERSION N/A 02/16/2017  ? Procedure: CARDIOVERSION;  Surgeon: Thompson Grayer, MD;  Location: [facility];  Service: Cardiovascular;  Laterality: N/A;  ? COLONOSCOPY WITH PROPOFOL N/A 12/13/2012  ? Procedure: COLONOSCOPY WITH PROPOFOL;  Surgeon: Garlan Fair, MD;  Location: [facility];  Service: Endoscopy;  Laterality: N/A;  ? FRACTURE SURGERY    ? HYDROCELE EXCISION Left 05/16/2018  ? Procedure: HYDROCELECTOMY ADULT;  Surgeon: [name], MD;  Location: [facility];  Service: Urology;  Laterality: Left;  ? INSERT / REPLACE / REMOVE PACEMAKER  02/16/2017  ? KYPHOPLASTY N/A 02/10/2021  ?  Procedure: KYPHOPLASTY L-5;  Surgeon: Newman Pies, MD;  Location: [facility];  Service: Neurosurgery;  Laterality: N/A;  ? LAPAROSCOPY N/A 06/04/2020  ? Procedure: POSSIBLE LAPAROTOMY POSSIBLE BOWEL RESSECTION;  Surgeon: Clovis Riley, MD;  Location: [facility];  Service: General;  Laterality: N/A;  ? PACEMAKER IMPLANT N/A 02/16/2017  ? 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 PROSTATE BIOPSY  11/23/2019  ? WRIST FRACTURE SURGERY Left   ? with pins  ? ?Social History:  reports that he quit smoking about 55 years ago. His smoking use included cigarettes. He has a 0.96 pack-year smoking history. He has never used smokeless tobacco. He reports current alcohol use. He reports that he does not use drugs. ? ?Allergies  ?Allergen Reactions  ? Atorvastatin Palpitations  ? Ezetimibe Palpitations  ? Morphine And Related Other (See Comments)  ?  Pt has hypersensitivity to morphine- makes confused after prolonged usage  ? ? ?Family History  ?Problem Relation Age of Onset  ? CVA Mother   ? Coronary artery disease Mother   ? Stroke Mother   ? Coronary artery disease Father   ? Heart attack Father   ? Stomach cancer Maternal Grandmother   ? Breast cancer Neg Hx   ? Prostate cancer Neg Hx   ? Colon cancer Neg Hx   ? Pancreatic cancer Neg Hx   ? ? ?  Prior to Admission medications   ?Medication Sig Start Date End Date Taking? Authorizing Provider  ?acetaminophen (TYLENOL) 325 MG tablet Take 2 tablets (650 mg total) by mouth every 6 (six) hours. 02/21/21  Yes Setzer, Edman Circle, PA-C  ?calcium carbonate (TUMS - DOSED IN MG ELEMENTAL CALCIUM) 500 MG chewable tablet Chew 2 tablets by mouth daily as needed for indigestion or heartburn.   Yes [provider]  ?ELIQUIS 5 MG TABS tablet TAKE 1 TABLET(5 MG) BY MOUTH TWICE DAILY 04/11/21  Yes Allred, Jeneen Rinks, MD  ?furosemide (LASIX) 20 MG tablet Take 20 mg by mouth daily. 12/19/20  Yes [provider]   ?HYDROcodone-acetaminophen (NORCO) 5-325 MG tablet Take 1 tablet by mouth every 6 (six) hours as needed for moderate pain. 05/14/21  Yes Lovorn, Jinny Blossom, MD  ?Multiple Vitamin (MULTIVITAMIN WITH MINERALS) TABS tablet Take 1 tablet by mouth daily.   Yes [provider]  ?oxyCODONE (ROXICODONE) 5 MG immediate release tablet Take 1 tablet (5 mg total) by mouth every 3 (three) hours as needed. 04/17/21 04/17/22 Yes Lovorn, Jinny Blossom, MD  ?pantoprazole (PROTONIX) 40 MG tablet Take 40 mg daily by mouth. 10/16/16  Yes [provider]  ?polyethylene glycol (MIRALAX / GLYCOLAX) 17 g packet Take 17 g by mouth daily. 02/21/21  Yes Setzer, Edman Circle, PA-C  ?Propylene Glycol (SYSTANE BALANCE OP) Place 1 drop into both eyes daily.   Yes [provider]  ?rosuvastatin (CRESTOR) 10 MG tablet Take 10 mg by mouth every morning.   Yes [provider]  ?senna (SENOKOT) 8.6 MG TABS tablet Take 1 tablet (8.6 mg total) by mouth daily. 02/22/21  Yes Setzer, Edman Circle, PA-C  ?tamsulosin (FLOMAX) 0.4 MG CAPS capsule TAKE 1 CAPSULE(0.4 MG) BY MOUTH DAILY AFTER AND SUPPER 11/12/20  Yes Tyler Pita, MD  ?trolamine salicylate (ASPERCREME) 10 % cream Apply 1 application topically daily.   Yes [provider]  ?calcitonin, salmon, (MIACALCIN/FORTICAL) 200 UNIT/ACT nasal spray Place 1 spray into alternate nostrils daily. 02/21/21   Setzer, Edman Circle, PA-C  ?meloxicam (MOBIC) 7.5 MG tablet Take 7.5 mg by mouth daily as needed. 06/03/21   [provider]  ?sodium chloride (OCEAN) 0.65 % SOLN nasal spray Place 1 spray into both nostrils daily as needed for congestion.    [provider]  ? ? ?Physical Exam: ?Vitals:  ? 06/09/21 1630 06/09/21 1900 06/09/21 1923 06/09/21 2103  ?BP: 134/78 125/82  120/85  ?Pulse: 82 81  75  ?Resp: 20 (!) 21  20  ?Temp:   98.5 ?F (36.9 ?C) 100 ?F (37.8 ?C)  ?TempSrc:   Oral Oral  ?SpO2: 99% 99%  97%  ?Weight:      ?Height:      ? ?Constitutional: NAD, calm,  comfortable ?Eyes: PERRL, lids and conjunctivae normal ?ENMT: Mucous membranes are moist. Posterior pharynx clear of any exudate or lesions.Normal dentition.  ?Neck: normal, supple, no masses, no thyromegaly ?Respiratory: clear to auscultation bilaterally, no wheezing, no crackles. Normal respiratory effort. No accessory muscle use.  ?Cardiovascular: Regular rate and rhythm, no murmurs / rubs / gallops. No extremity edema. 2+ pedal pulses. No carotid bruits.  ?Abdomen: no tenderness, no masses palpated. No hepatosplenomegaly. Bowel sounds positive.  ?Musculoskeletal: no clubbing / cyanosis. No joint deformity upper and lower extremities. Good ROM, no contractures. Normal muscle tone.  ?Skin: no rashes, lesions, ulcers. No induration ?Neurologic: CN 2-12 grossly intact. Sensation intact, DTR normal. Strength 5/5 in all 4.  ?Psychiatric: Normal judgment and insight. Alert and  oriented x 3. Normal mood.  ? ?Data Reviewed: ? ?COVID and flu neg ? ?Lactates 2.2 - > 2.0 ? ?WBC 3.8k ? ?UA neg ? ?CT AP = Multifocal PNA + constipation ? ?Assessment and Plan: ?* Severe sepsis with acute organ dysfunction ([place]) ?Sepsis with new O2 requirement from multifocal pneumonia. ?PNA pathway ?Empiric rocephin + azithro ?BCx pending ?covid and flu neg ?Will order RVP ?Tele monitor ?Cont pulse ox ?O2 via [place] ?IVF: 1L bolus in ED ?Lactates: 2.2 -> 2.0 ? ?Acute respiratory failure with hypoxia ([place]) ?Patient has acute respiratory failure with hypoxia due to having a new oxygen requirement.  That is the patient has a PaO2 < 60 (pulse Ox < 90%) on room air. ? ?Multifocal pneumonia ?See above, seen on CT scan. ? ?Constipation ?Resume home Senna scheduled ?Resume home miralax scheduled ?Add PRN dulcolax ? ?Back pain ?Cont home pain meds including: ?PRN hydrocodone ?PRN tylenol ?PRN mobic ?Lidocaine patch ? ?Persistent atrial fibrillation ([place]) ?Cont eliquis ? ? ? ? ? Advance Care Planning:   Code Status: Full Code ? ?Consults: None ? ?Family  Communication: No family in room ? ?Severity of Illness: ?The appropriate patient status for this patient is INPATIENT. Inpatient status is judged to be reasonable and necessary in order to provide the required intensity of service to

## 2021-06-09 NOTE — ED Notes (Signed)
Patient transported to CT with RN 

## 2021-06-09 NOTE — ED Notes (Signed)
Blood cultures drawn before the start of antibotics ?

## 2021-06-09 NOTE — ED Notes (Signed)
CRITICAL VALUE STICKER ? ?CRITICAL VALUE: Lactic 2.2 ? ?RECEIVER (on-site recipient of call):Ziyana Morikawa, RN ? ?DATE & TIME NOTIFIED: 1507 06/09/2021 ? ?MD NOTIFIED: Tamera Punt, MD ? ?TIME OF NOTIFICATION:1508 ? ?RESPONSE: See orders ? ?

## 2021-06-09 NOTE — Assessment & Plan Note (Addendum)
1. Continue home Senna scheduled ?2. Continue home miralax scheduled ?3. No significant results despite cathartics and pt reported small "hard" stool prior to admit ?4. Results noted after soap suds enema given  ?5. Have prescribed stool softener on d/c ?

## 2021-06-09 NOTE — Assessment & Plan Note (Addendum)
Patient has acute respiratory failure with hypoxia due to having a new oxygen requirement, needing 1-2L [place] ?Successfully weaned to room air ?

## 2021-06-09 NOTE — Assessment & Plan Note (Addendum)
Remained rate controlled ?Cont eliquis. No evidence of acute blood loss ?

## 2021-06-09 NOTE — Assessment & Plan Note (Addendum)
Cont home pain meds including: ?1. PRN hydrocodone ?2. PRN tylenol ?3. PRN mobic ?4. Lidocaine patch ? ?Remained stable. Have ordered stool softener in addition to home stimulant laxative ?

## 2021-06-09 NOTE — ED Triage Notes (Addendum)
Pt arrives to ED with c/o fever, vomiting. This started early this morning. Pt reports x2 episodes of vomiting. Highest temperature at home 101.98F. He took 650 mg Tylenol x1 hr ago.  ?

## 2021-06-09 NOTE — Assessment & Plan Note (Addendum)
Sepsis with new O2 requirement from multifocal pneumonia. ?1. PNA pathway ?2. Empiric rocephin + azithro ?3. BCx neg ?4. Respiratory viral panel, covid and flu neg ?5. Lactate normalized with IVF ?

## 2021-06-09 NOTE — Assessment & Plan Note (Signed)
See above, seen on CT scan. ?

## 2021-06-09 NOTE — ED Provider Notes (Signed)
?[facility] ?Provider Note ? ? ?CSN: [id number] ?Arrival date & time: 06/09/21  1402 ? ?  ? ?History ? ?Chief Complaint  ?Patient presents with  ? Fever  ? ? ?Todd Chapman is a 76 y.o. male. ? ?Patient is a 76 year old male who presents with a febrile illness.  Per chart review, he has a history of atrial fibrillation on Eliquis, prostate cancer, hypertension.  He said he started feeling bad yesterday afternoon with some achiness and developed fevers through the night.  His Tmax is 103.  He has had some cough and congestion as well.  He has had 2 episodes of vomiting.  He had some diarrhea earlier in the week but took some Imodium and now has not had a bowel movement the last 2 to 3 days.  He had a very small bowel movement this morning.  He does have some abdominal discomfort.  He has a history of prior small bowel obstruction.  He denies any urinary symptoms.  No associated chest pain.  He is noted to have a little bit of shortness of breath.  He was found to be hypoxic in the ED with oxygen saturations of 87% on room air was placed on nasal cannula oxygen at 2 L/min. ? ? ?  ? ?Home Medications ?Prior to Admission medications   ?Medication Sig Start Date End Date Taking? Authorizing Provider  ?acetaminophen (TYLENOL) 325 MG tablet Take 2 tablets (650 mg total) by mouth every 6 (six) hours. 02/21/21   Setzer, Edman Circle, PA-C  ?calcitonin, salmon, (MIACALCIN/FORTICAL) 200 UNIT/ACT nasal spray Place 1 spray into alternate nostrils daily. 02/21/21   Setzer, Edman Circle, PA-C  ?calcium carbonate (TUMS - DOSED IN MG ELEMENTAL CALCIUM) 500 MG chewable tablet Chew 2 tablets by mouth daily as needed for indigestion or heartburn.    [provider]  ?ELIQUIS 5 MG TABS tablet TAKE 1 TABLET(5 MG) BY MOUTH TWICE DAILY 04/11/21   Allred, Jeneen Rinks, MD  ?furosemide (LASIX) 20 MG tablet Take 20 mg by mouth daily. 12/19/20   [provider]  ?HYDROcodone-acetaminophen (NORCO) 5-325 MG tablet  Take 1 tablet by mouth every 6 (six) hours as needed for moderate pain. 05/14/21   Lovorn, Jinny Blossom, MD  ?Multiple Vitamin (MULTIVITAMIN WITH MINERALS) TABS tablet Take 1 tablet by mouth daily.    [provider]  ?oxyCODONE (ROXICODONE) 5 MG immediate release tablet Take 1 tablet (5 mg total) by mouth every 3 (three) hours as needed. 04/17/21 04/17/22  Lovorn, Jinny Blossom, MD  ?pantoprazole (PROTONIX) 40 MG tablet Take 40 mg daily by mouth. 10/16/16   [provider]  ?polyethylene glycol (MIRALAX / GLYCOLAX) 17 g packet Take 17 g by mouth daily. 02/21/21   Setzer, Edman Circle, PA-C  ?Propylene Glycol (SYSTANE BALANCE OP) Place 1 drop into both eyes daily.    [provider]  ?rosuvastatin (CRESTOR) 10 MG tablet Take 10 mg by mouth every morning.    [provider]  ?senna (SENOKOT) 8.6 MG TABS tablet Take 1 tablet (8.6 mg total) by mouth daily. 02/22/21   Setzer, Edman Circle, PA-C  ?sodium chloride (OCEAN) 0.65 % SOLN nasal spray Place 1 spray into both nostrils daily as needed for congestion.    [provider]  ?tamsulosin (FLOMAX) 0.4 MG CAPS capsule TAKE 1 CAPSULE(0.4 MG) BY MOUTH DAILY AFTER AND SUPPER 11/12/20   Tyler Pita, MD  ?trolamine salicylate (ASPERCREME) 10 % cream Apply 1 application topically daily.    [provider]  ?   ? ?  Allergies    ?Atorvastatin, Ezetimibe, and Morphine and related   ? ?Review of Systems   ?Review of Systems  ?Constitutional:  Positive for chills, fatigue and fever. Negative for diaphoresis.  ?HENT:  Negative for congestion, rhinorrhea and sneezing.   ?Eyes: Negative.   ?Respiratory:  Positive for cough and shortness of breath. Negative for chest tightness.   ?Cardiovascular:  Negative for chest pain and leg swelling.  ?Gastrointestinal:  Positive for abdominal pain, nausea and vomiting. Negative for blood in stool and diarrhea.  ?Genitourinary:  Negative for difficulty urinating, flank pain, frequency and hematuria.  ?Musculoskeletal:   Positive for myalgias. Negative for arthralgias and back pain.  ?Skin:  Negative for rash.  ?Neurological:  Negative for dizziness, speech difficulty, weakness, numbness and headaches.  ? ?Physical Exam ?Updated Vital Signs ?BP 134/78   Pulse 82   Temp (!) 103.1 ?F (39.5 ?C) (Oral)   Resp 20   Ht '6\' 2"'$  (1.88 m)   Wt 90.7 kg   SpO2 99%   BMI 25.68 kg/m?  ?Physical Exam ?Constitutional:   ?   Appearance: He is well-developed.  ?HENT:  ?   Head: Normocephalic and atraumatic.  ?Eyes:  ?   Pupils: Pupils are equal, round, and reactive to light.  ?Cardiovascular:  ?   Rate and Rhythm: Normal rate and regular rhythm.  ?   Heart sounds: Normal heart sounds.  ?Pulmonary:  ?   Effort: Pulmonary effort is normal. No respiratory distress.  ?   Breath sounds: Normal breath sounds. No wheezing or rales.  ?Chest:  ?   Chest wall: No tenderness.  ?Abdominal:  ?   General: Bowel sounds are normal.  ?   Palpations: Abdomen is soft.  ?   Tenderness: There is no abdominal tenderness. There is no guarding or rebound.  ?Musculoskeletal:     ?   General: Normal range of motion.  ?   Cervical back: Normal range of motion and neck supple.  ?Lymphadenopathy:  ?   Cervical: No cervical adenopathy.  ?Skin: ?   General: Skin is warm and dry.  ?   Findings: No rash.  ?Neurological:  ?   Mental Status: He is alert and oriented to person, place, and time.  ? ? ?ED Results / Procedures / Treatments   ?Labs ?(all labs ordered are listed, but only abnormal results are displayed) ?Labs Reviewed  ?COMPREHENSIVE METABOLIC PANEL - Abnormal; Notable for the following components:  ?    Result Value  ? Glucose, Bld 119 (*)   ? BUN 24 (*)   ? Calcium 10.4 (*)   ? Total Bilirubin 1.4 (*)   ? All other components within normal limits  ?LACTIC ACID, PLASMA - Abnormal; Notable for the following components:  ? Lactic Acid, Venous 2.2 (*)   ? All other components within normal limits  ?CBC WITH DIFFERENTIAL/PLATELET - Abnormal; Notable for the following  components:  ? WBC 3.8 (*)   ? RBC 3.85 (*)   ? Hemoglobin 11.4 (*)   ? HCT 34.5 (*)   ? Lymphs Abs 0.2 (*)   ? All other components within normal limits  ?BRAIN NATRIURETIC PEPTIDE - Abnormal; Notable for the following components:  ? B Natriuretic Peptide 222.9 (*)   ? All other components within normal limits  ?RESP PANEL BY RT-PCR (FLU A&B, COVID) [id number]  ?CULTURE, BLOOD (ROUTINE X 2)  ?CULTURE, BLOOD (ROUTINE X 2)  ?LACTIC ACID, PLASMA  ?URINALYSIS, ROUTINE W REFLEX MICROSCOPIC  ? ? ?  EKG ?EKG Interpretation ? ?Date/Time:  Monday June 09 2021 14:37:19 EDT ?Ventricular Rate:  92 ?PR Interval:  161 ?QRS Duration: 95 ?QT Interval:  360 ?QTC Calculation: 446 ?R Axis:   36 ?Text Interpretation: Atrial flutter Ventricular premature complex Left atrial enlargement RSR' in V1 or V2, right VCD or RVH Confirmed by Malvin Johns [id number]) on 06/09/2021 3:29:03 PM ? ?Radiology ?CT Abdomen Pelvis Wo Contrast ? ?Result Date: 06/09/2021 ?CLINICAL DATA:  Bowel obstruction suspected.  Fever, vomiting. EXAM: CT ABDOMEN AND PELVIS WITHOUT CONTRAST TECHNIQUE: Multidetector CT imaging of the abdomen and pelvis was performed following the standard protocol without IV contrast. RADIATION DOSE REDUCTION: This exam was performed according to the departmental dose-optimization program which includes automated exposure control, adjustment of the mA and/or kV according to patient size and/or use of iterative reconstruction technique. COMPARISON:  CT examination dated February 06, 2021 FINDINGS: Lower chest: Reticulonodular and patchy opacities in the anterior aspect of the left upper, right middle and right lower lobes concerning for pneumonia. Hepatobiliary: No focal liver abnormality is seen. No gallstones, gallbladder wall thickening, or biliary dilatation. Gallbladder is markedly distended. Pancreas: Unremarkable. No pancreatic ductal dilatation or surrounding inflammatory changes. Spleen: Normal in size without focal abnormality.  Adrenals/Urinary Tract: Adrenal glands are unremarkable. Kidneys are normal, without renal calculi, focal lesion, or hydronephrosis. Bladder is unremarkable. Stomach/Bowel: Stomach is within normal limits. Appendix not vis

## 2021-06-10 ENCOUNTER — Inpatient Hospital Stay (HOSPITAL_COMMUNITY): Payer: Medicare Other

## 2021-06-10 DIAGNOSIS — R6520 Severe sepsis without septic shock: Secondary | ICD-10-CM | POA: Diagnosis not present

## 2021-06-10 DIAGNOSIS — A419 Sepsis, unspecified organism: Secondary | ICD-10-CM | POA: Diagnosis not present

## 2021-06-10 LAB — RESPIRATORY PANEL BY PCR

## 2021-06-10 LAB — BASIC METABOLIC PANEL
Anion gap: 4 — ABNORMAL LOW (ref 5–15)
BUN: 22 mg/dL (ref 8–23)
CO2: 26 mmol/L (ref 22–32)
Calcium: 9.9 mg/dL (ref 8.9–10.3)
Chloride: 106 mmol/L (ref 98–111)
Creatinine, Ser: 0.73 mg/dL (ref 0.61–1.24)
GFR, Estimated: >60 mL/min (ref >60)
Glucose, Bld: 110 mg/dL — ABNORMAL HIGH (ref 70–99)
Potassium: 4.6 mmol/L (ref 3.5–5.1)
Sodium: 136 mmol/L (ref 135–145)

## 2021-06-10 LAB — HIV ANTIBODY (ROUTINE TESTING W REFLEX): HIV Screen 4th Generation wRfx: NONREACTIVE

## 2021-06-10 LAB — CBC
HCT: 27.4 % — ABNORMAL LOW (ref 39.0–52.0)
Hemoglobin: 8.9 g/dL — ABNORMAL LOW (ref 13.0–17.0)
MCH: 30.1 pg (ref 26.0–34.0)
MCHC: 32.5 g/dL (ref 30.0–36.0)
MCV: 92.6 fL (ref 80.0–100.0)
Platelets: 116 10*3/uL — ABNORMAL LOW (ref 150–400)
RBC: 2.96 MIL/uL — ABNORMAL LOW (ref 4.22–5.81)
RDW: 14.4 % (ref 11.5–15.5)
WBC: 4.8 10*3/uL (ref 4.0–10.5)
nRBC: 0 % (ref 0.0–0.2)

## 2021-06-10 IMAGING — US US ABDOMEN LIMITED
1 series · 15 of 25 positions shown · non-contrast
Comparison: None.

CLINICAL DATA: Elevated liver enzymes

EXAM:
ULTRASOUND ABDOMEN LIMITED RIGHT UPPER QUADRANT

[Series 1: us abdomen limited ruq mc & wl · 15 of 71 slices shown]
[im 1/71]
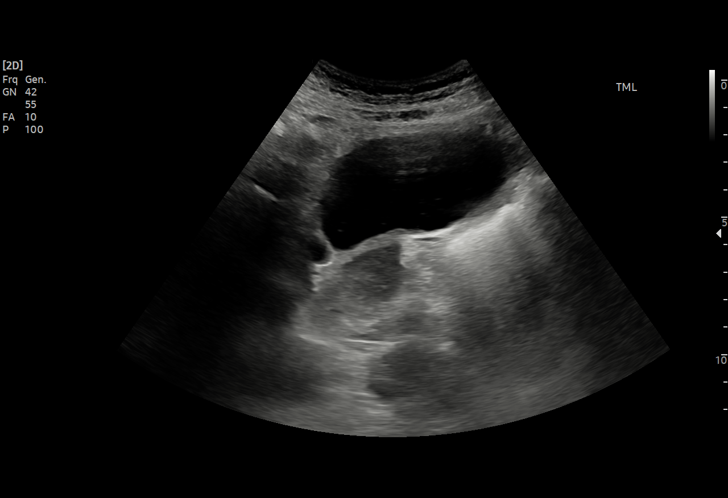
[im 6/71]
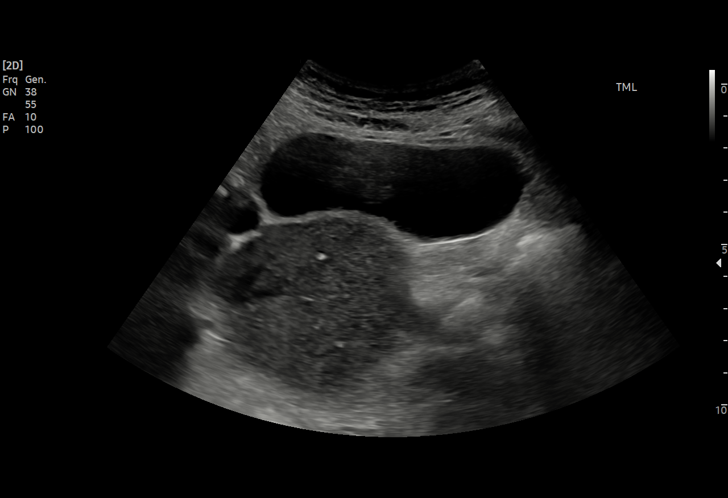
[im 12/71]
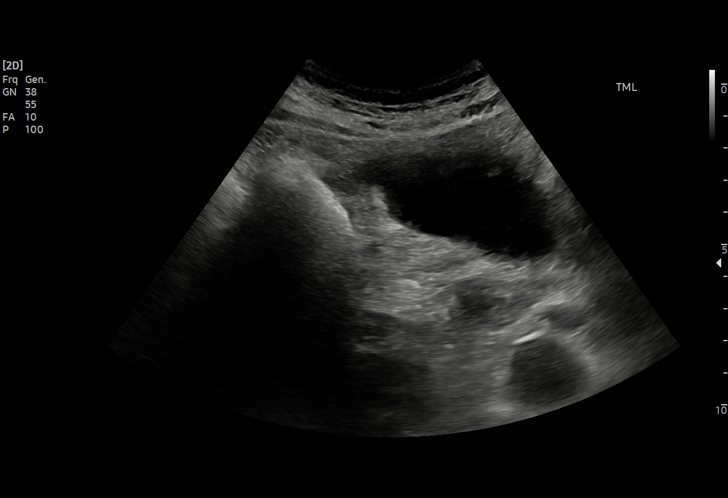
[im 15/71]
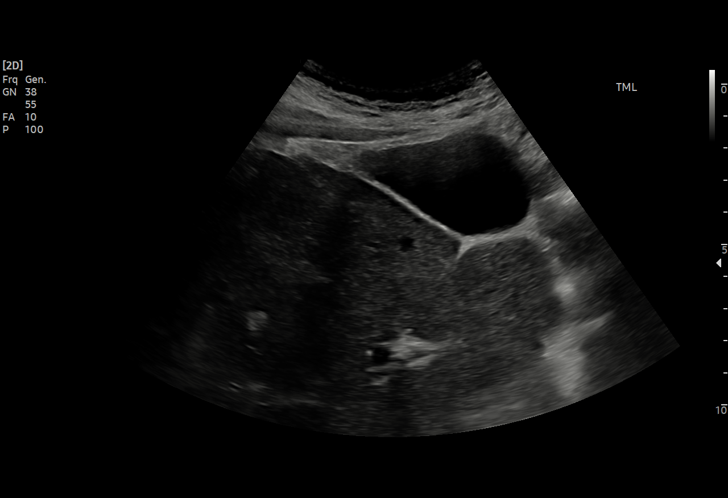
[im 21/71]
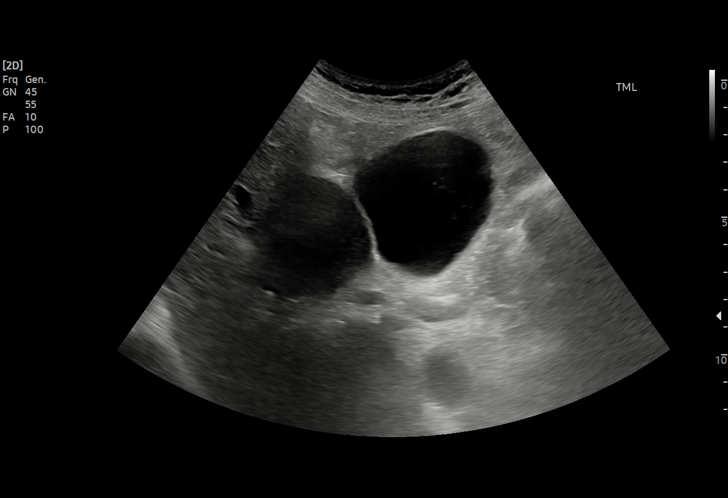
[im 27/71]
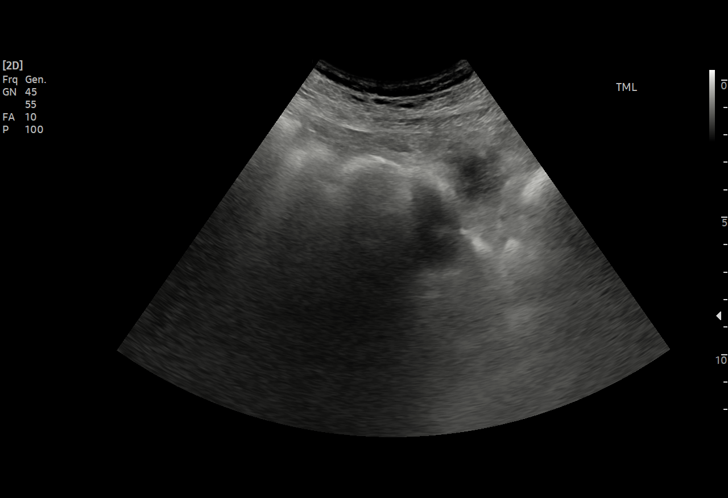
[im 30/71]
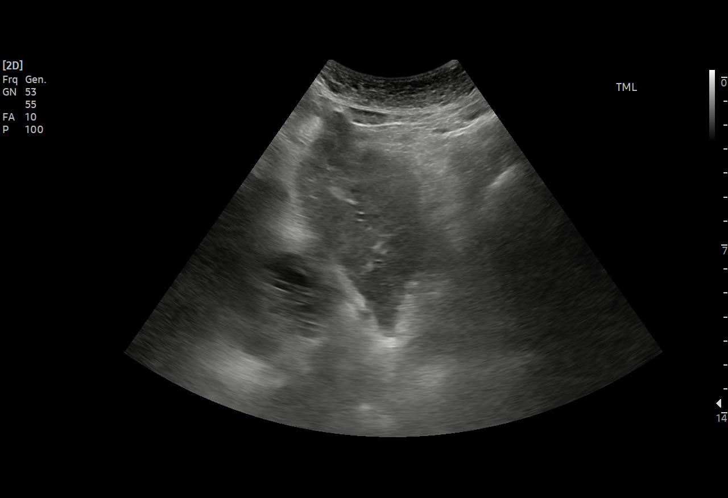
[im 36/71]
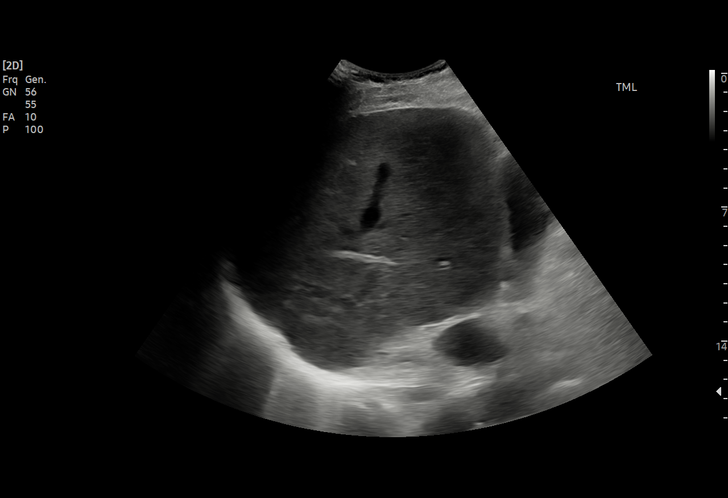
[im 41/71]
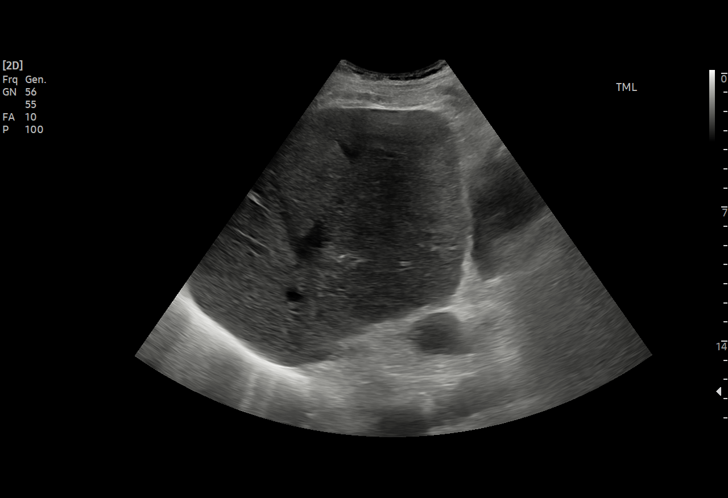
[im 44/71]
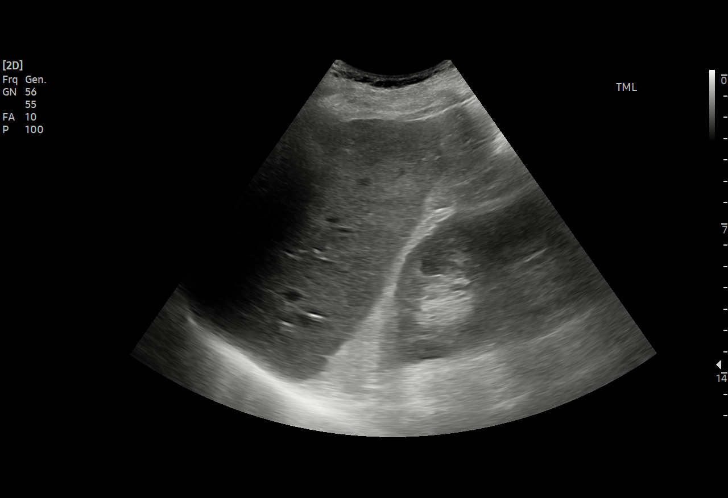
[im 50/71]
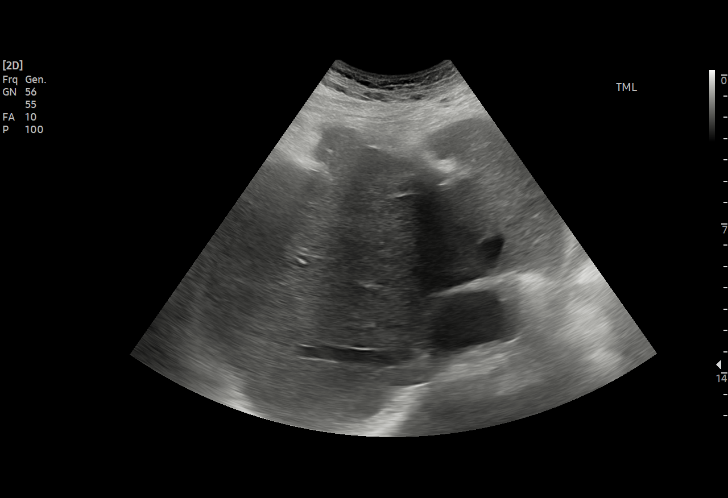
[im 56/71]
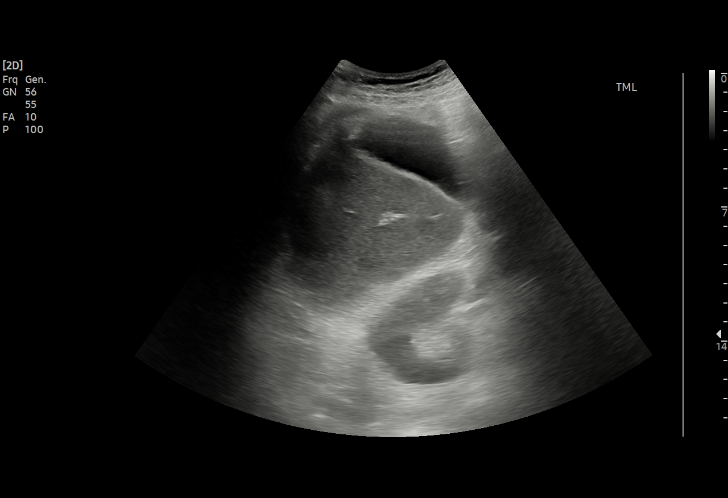
[im 59/71]
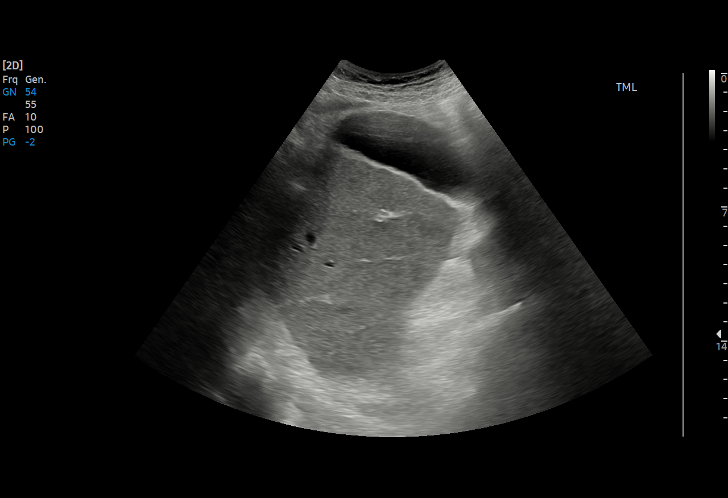
[im 65/71]
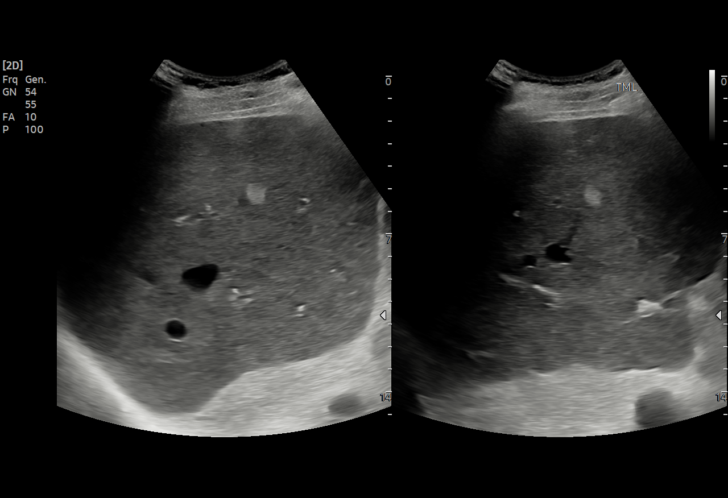
[im 71/71]
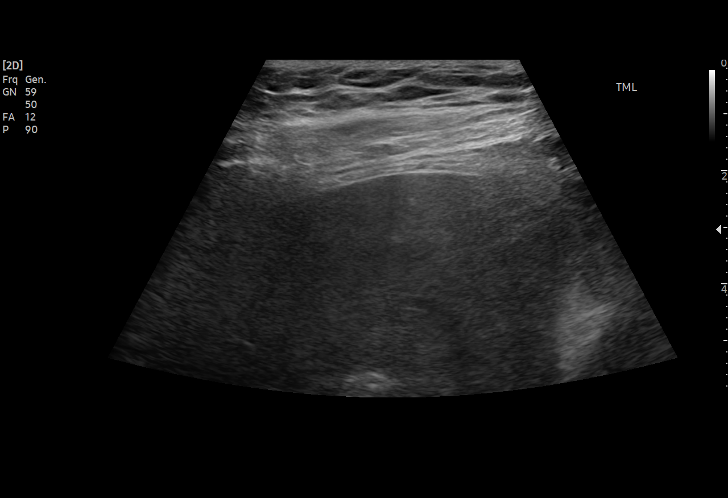

[15 of 25 positions shown; findings below may reference images not displayed]

FINDINGS: Gallbladder:

Distended and contains sludge with no gallbladder wall thickening.
No sonographic Murphy sign noted by sonographer.

Common bile duct:

Diameter: 8 mm

Liver:

Echogenic lesion seen in the liver measuring 0.9 x 0.9 x 0.9 cm. No
focal lesion identified. Increased parenchymal echogenicity. Portal
vein is patent on color Doppler imaging with normal direction of
blood flow towards the liver.

Other: None.
IMPRESSION: 1. Gallbladder is distended and contains sludge with no gallbladder
wall thickening and negative sonographic Murphy sign. Correlate for
right upper quadrant pain, if there is clinical concern HIDA scan
could be performed further evaluation.
2.   Hepatic steatosis.
3. Indeterminate echogenic lesion seen in the right lobe of the
liver measuring up to 0.9 cm. Recommend further evaluation with
contrast-enhanced liver MRI which can be performed non emergently.

## 2021-06-10 NOTE — Plan of Care (Signed)
Pt admitted this shift to unit. Oriented to room and call bell. No acute events overnight. ?Problem: Clinical Measurements: ?Goal: Ability to maintain clinical measurements within normal limits will improve ?Outcome: Progressing ?Goal: Will remain free from infection ?Outcome: Progressing ?Goal: Diagnostic test results will improve ?Outcome: Progressing ?Goal: Respiratory complications will improve ?Outcome: Progressing ?Goal: Cardiovascular complication will be avoided ?Outcome: Progressing ?  ?Problem: Activity: ?Goal: Risk for activity intolerance will decrease ?Outcome: Progressing ?  ?Problem: Nutrition: ?Goal: Adequate nutrition will be maintained ?Outcome: Progressing ?  ?Problem: Coping: ?Goal: Level of anxiety will decrease ?Outcome: Progressing ?  ?Problem: Pain Managment: ?Goal: General experience of comfort will improve ?Outcome: Progressing ?  ?Problem: Safety: ?Goal: Ability to remain free from injury will improve ?Outcome: Progressing ?  ?Problem: Skin Integrity: ?Goal: Risk for impaired skin integrity will decrease ?Outcome: Progressing ?  ?

## 2021-06-10 NOTE — Evaluation (Signed)
Clinical/Bedside Swallow Evaluation ?Patient Details  ?Name: Todd Chapman ?MRN: [id number] ?Date of Birth: 1945/04/06 ? ?Today's Date: 06/10/2021 ?Time: SLP Start Time (ACUTE ONLY): 2992 SLP Stop Time (ACUTE ONLY): 1735 ?SLP Time Calculation (min) (ACUTE ONLY): 10 min ? ?Past Medical History:  ?Past Medical History:  ?Diagnosis Date  ? Arthritis   ? "thumbs, back" (02/16/2017)  ? Bradycardia 02/2017  ? Dyspnea   ? with exertion  ? Flu 2 weeks ago  ? GERD (gastroesophageal reflux disease)   ? Hepatitis A as child  ? HLD (hyperlipidemia)   ? Persistent atrial fibrillation ([place]) 12/18/2016  ? Pneumonia 1999; 2018  ? Presence of permanent cardiac pacemaker   ? st jude  ? Prostate cancer ([place])   ? ?Past Surgical History:  ?Past Surgical History:  ?Procedure Laterality Date  ? APPENDECTOMY    ? age 70  ? CARDIOVERSION  02/16/2017  ? CARDIOVERSION N/A 02/16/2017  ? Procedure: CARDIOVERSION;  Surgeon: Thompson Grayer, MD;  Location: [facility];  Service: Cardiovascular;  Laterality: N/A;  ? COLONOSCOPY WITH PROPOFOL N/A 12/13/2012  ? Procedure: COLONOSCOPY WITH PROPOFOL;  Surgeon: Garlan Fair, MD;  Location: [facility];  Service: Endoscopy;  Laterality: N/A;  ? FRACTURE SURGERY    ? HYDROCELE EXCISION Left 05/16/2018  ? Procedure: HYDROCELECTOMY ADULT;  Surgeon: [name], MD;  Location: [facility];  Service: Urology;  Laterality: Left;  ? INSERT / REPLACE / REMOVE PACEMAKER  02/16/2017  ? KYPHOPLASTY N/A 02/10/2021  ? Procedure: KYPHOPLASTY L-5;  Surgeon: Newman Pies, MD;  Location: [facility];  Service: Neurosurgery;  Laterality: N/A;  ? LAPAROSCOPY N/A 06/04/2020  ? Procedure: POSSIBLE LAPAROTOMY POSSIBLE BOWEL RESSECTION;  Surgeon: Clovis Riley, MD;  Location: [facility];  Service: General;  Laterality: N/A;  ? PACEMAKER IMPLANT N/A 02/16/2017  ? 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 PROSTATE BIOPSY  11/23/2019   ? WRIST FRACTURE SURGERY Left   ? with pins  ? ?HPI:  ?Todd Chapman is a 76 y.o. male with PMH significant for persistent A-fib on Eliquis s/p PPM, HLD, prostate cancer.  Patient presented to the ED with complaint of fever, cough, congestion.  He started having fever of 103 at home from Sunday night, had 2 episodes of bloody vomiting.  He also had diarrhea earlier in the week but took Imodium and had no bowel movement in 2 to 3 days.  He reports that this is her third episode of pneumonia in last 1 year.  Follows up with Dr. Silas Flood as an outpatient.  Swallow evaluation ordered due to pt's pna. Pt was npo earlier for ultrasound.  ?  ?Assessment / Plan / Recommendation  ?Clinical Impression ? Pt presents with normal swallow function based on clinical evaluation. No focal CN deficits noted. Pt easily passed 3 ounce Yale water challenge and consumed solids - potatoes and carrots without any evidence of dysphagia.  Swallow was swift with adequate reciprocity of swallow/respiration with clear voice throughout all intake and no indications of retention, etc. Pt admits to h/o GERD stating he takes a PPI and has for several years.  Suspect his vomiting may have contributed to pna.   ? ?Recommend continue regular/thin diet as tolerated - No SLP follow up needed.  Reviewed with pt 3 ounce Yale - and potential for him to use in the future prn. ?SLP Visit Diagnosis: Dysphagia, unspecified (R13.10) ?   ?Aspiration Risk ?  No limitations  ?  ?Diet Recommendation Regular;Thin liquid  ? ?Liquid Administration via: Cup;Straw ?Medication Administration: Whole meds with liquid ?Supervision: Patient able to self feed ?Compensations: Slow rate;Small sips/bites ?Postural Changes: Seated upright at 90 degrees;Remain upright for at least 30 minutes after po intake  ?  ?Other  Recommendations Oral Care Recommendations: Oral care BID   ? ?Recommendations for follow up therapy are one component of a multi-disciplinary discharge planning  process, led by the attending physician.  Recommendations may be updated based on patient status, additional functional criteria and insurance authorization. ? ?Follow up Recommendations No SLP follow up  ? ? ?  ?Assistance Recommended at Discharge None  ?Functional Status Assessment Patient has not had a recent decline in their functional status  ?Frequency and Duration    ?  ?N/a  ?   ? ?Prognosis    ?N/a ?  ? ?Swallow Study   ?General Date of Onset: 06/10/21 ?HPI: Todd Chapman is a 75 y.o. male with PMH significant for persistent A-fib on Eliquis s/p PPM, HLD, prostate cancer.  Patient presented to the ED with complaint of fever, cough, congestion.  He started having fever of 103 at home from Sunday night, had 2 episodes of bloody vomiting.  He also had diarrhea earlier in the week but took Imodium and had no bowel movement in 2 to 3 days.  He reports that this is her third episode of pneumonia in last 1 year.  Follows up with Dr. Silas Flood as an outpatient.  Swallow evaluation ordered due to pt's pna. Pt was npo earlier for ultrasound. ?Type of Study: Bedside Swallow Evaluation ?Diet Prior to this Study: Regular;Thin liquids ?Temperature Spikes Noted: No ?Respiratory Status: Room air ?History of Recent Intubation: No ?Behavior/Cognition: Alert;Cooperative;Pleasant mood ?Oral Cavity Assessment: Within Functional Limits ?Oral Care Completed by SLP: No ?Oral Cavity - Dentition: Adequate natural dentition ?Vision: Functional for self-feeding ?Self-Feeding Abilities: Able to feed self ?Patient Positioning: Upright in bed ?Baseline Vocal Quality: Normal ?Volitional Cough: Strong ?Volitional Swallow: Able to elicit  ?  ?Oral/Motor/Sensory Function Overall Oral Motor/Sensory Function: Within functional limits   ?Ice Chips Ice chips: Not tested   ?Thin Liquid Thin Liquid: Within functional limits ?Presentation: Cup  ?  ?Nectar Thick Nectar Thick Liquid: Not tested   ?Honey Thick Honey Thick Liquid: Not tested    ?Puree Puree: Within functional limits ?Presentation: Self Fed;Spoon   ?Solid ? ? ?  Solid: Within functional limits ?Presentation: Self Fed  ? ?  ? ?Macario Golds ?06/10/2021,5:45 PM ? ?Kathleen Lime, MS CCC SLP ?Acute Rehab Services ?Office [phone] ?Pager [phone] ? ? ?

## 2021-06-10 NOTE — Progress Notes (Addendum)
?PROGRESS NOTE ? ?Todd Chapman  ?DOB: 30-May-1945  ?PCP: [name], MD ?UVO:[id number]  ?DOA: 06/09/2021 ? LOS: 1 day  ?Hospital Day: 2 ? ?Brief narrative: ?LEDON WEIHE is a 76 y.o. male with PMH significant for persistent A-fib on Eliquis s/p PPM, HLD, prostate cancer. ?Patient presented to the ED with complaint of fever, cough, congestion. ?He started having fever of 103 at home from Sunday night, had 2 episodes of bloody vomiting.  He also had diarrhea earlier in the week but took Imodium and had no bowel movement in 2 to 3 days. ?He reports that this is her third episode of pneumonia in last 1 year. ?Follows up with Dr. Silas Flood as an outpatient. ? ?In the ED, patient had temperature 103.1 ?Lactic acid level was elevated to 2, procalcitonin level was elevated to 3.74. ?Respiratory virus panel negative for COVID and influenza PCR ? ?CT scan of the chest showed ?Multiple reticulonodular and patchy opacities in bilateral lungs concerning for pneumonia. Follow-up examination to resolution is recommended. ?Bowel loops are normal in caliber. Retained colonic stool suggesting constipation. Scattered colonic diverticula without evidence of acute diverticulitis. ?No evidence of cholelithiasis, gallbladder is however distended. Right upper quadrant sonogram could be considered for further evaluation if clinically warranted. ? ?Blood cultures sent.  Patient was started on broad-spectrum antibiotics ?Admitted to hospitalist service ?  ?Subjective: ?Patient was seen and examined this morning. ?Propped up in bed.  Not in distress.  Not on supplemental oxygen. ?No recurrence of fever since yesterday afternoon. ?Denies any choking episodes ? ?Principal Problem: ?  Severe sepsis with acute organ dysfunction ([place]) ?Active Problems: ?  Acute respiratory failure with hypoxia ([place]) ?  Constipation ?  Multifocal pneumonia ?  Persistent atrial fibrillation ([place]) ?  Back pain ?  Pacemaker ?  ? ?Assessment and Plan: ?Sepsis due  to multifocal pneumonia ?Acute respite failure with hypoxia ?-Presented with fever, cough, congestion.   ?-CT scan of chest finding as above with multiple reticulonodular and patchy opacities in both lungs. ?-Blood cultures sent.  Sputum culture ordered.  Started on IV Rocephin and azithromycin.  QTc 446 ms ?-COVID PCR pending.  Recent negative. ?-Continue the same.  Encourage oral hydration. ?-Monitor fever, WBC count, lactic acid, procalcitonin trend ?-Currently on 1 to 2 L oxygen by nasal collar at rest.  Check on ambulation ?-Sees Dr. Silas Flood as an outpatient. ?-Obtain speech therapy evaluation to rule out aspiration tendency ?Recent Labs  ?Lab 06/09/21 ?1426 06/09/21 ?1645 06/09/21 ?2227 06/10/21 ?0431  ?WBC 3.8*  --   --  4.8  ?LATICACIDVEN 2.2* 2.0*  --   --   ?PROCALCITON  --   --  3.74  --   ? ?Rusty dark vomiting ?Chronic anemia ?-Patient mentions an episode of rusty dark vomiting.  He denies any history of GI bleeding in the past. ?-Hemoglobin at baseline close to 9. Probably hemoconcentrated on admission yesterday.  Currently at his baseline.  Repeat hemoglobin tomorrow.  Obtain FOBT.  Patient is chronically on Eliquis for A-fib.  I will put it on hold for now. ?-If any evidence of active GI bleeding, may need GI evaluation. ?Recent Labs  ?  02/08/21 ?7425 02/09/21 ?1131 02/11/21 ?9563 02/13/21 ?8756 02/17/21 ?4332 06/09/21 ?1426 06/10/21 ?0431  ?HGB 9.7*   < > 10.6* 9.8* 9.2* 11.4* 8.9*  ?MCV 94.6   < > 94.9 92.7 93.0 89.6 92.6  ?FERRITIN 129  --   --   --   --   --   --   ?  RETICCTPCT 1.9  --   --   --   --   --   --   ? < > = values in this interval not displayed.  ? ?Persistent atrial fibrillation ?-eliquis on hold with concern of GI bleeding ?-has a PPM in place. ? ?HLD ?-Crestor ? ?Distended gallbladder ?-Per CT abdomen, patient has no evidence of cholelithiasis, gallbladder is however distended.  ?-Obtain right upper quadrant sonogram for further evaluation. ?-LFTs normal except for slightly  elevated total bilirubin ? ?Constipation ?-Per report, earlier in the week he had diarrhea after which he took Lomotil and got constipated.   ?-Last BM 2 days prior to presentation.  CT abdomen did not show any evidence of bowel obstruction.   ?-Currently on scheduled senna, as needed MiraLAX and Dulcolax  ? ?History of prostate cancer ?-Patient follows up with urologist Dr. Diona Fanti. ?-Continue Flomax ? 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 ?Goals of care ?  Code Status: Full Code  ? ? ?Mobility: Encourage ambulation ? ?Nutritional status:  ?Body mass index is 25.68 kg/m?.  ?  ?  ? ? ? ? ?Diet:  ?Diet Order   ? ?       ?  Diet Heart Room service appropriate? Yes; Fluid consistency: Thin  Diet effective now       ?  ? ?  ?  ? ?  ? ? ?DVT prophylaxis: SCDs ?Place and maintain sequential compression device Start: 06/10/21 1038 ?  ?Antimicrobials: IV Rocephin, azithromycin ?Fluid: None ?Consultants: None ?Family Communication: None at bedside ? ?Status is: Inpatient ? ?Continue in-hospital care because: Ongoing management of sepsis ?Level of care: Telemetry  ? ?Dispo: The patient is from: Home ?             Anticipated d/c is to: Home in 1 to 2 days ?             Patient currently is not medically stable to d/c. ?  Difficult to place patient No ? ? ? ? ?Infusions:  ? azithromycin 500 mg (06/09/21 1609)  ? cefTRIAXone (ROCEPHIN)  IV Stopped (06/09/21 1608)  ? ? ?Scheduled Meds: ? lidocaine  1 patch Transdermal Q24H  ? multivitamin with minerals  1 tablet Oral Daily  ? pantoprazole  40 mg Oral Daily  ? polyethylene glycol  17 g Oral Daily  ? rosuvastatin  10 mg Oral q morning  ? senna  1 tablet Oral Daily  ? sodium chloride  1 spray Each Nare QHS  ? tamsulosin  0.4 mg Oral QPC supper  ? ? ?PRN meds: ?acetaminophen, bisacodyl, bisacodyl, calcium carbonate, HYDROcodone-acetaminophen, Lidocaine, polyvinyl alcohol  ? ?Antimicrobials: ?Anti-infectives  (From admission, onward)  ? ? Start     Dose/Rate Route Frequency Ordered Stop  ? 06/09/21 1530  cefTRIAXone (ROCEPHIN) 2 g in sodium chloride 0.9 % 100 mL IVPB       ? 2 g ?200 mL/hr over 30 Minutes Intravenous Every 24 hours 06/09/21 1526 06/14/21 1529  ? 06/09/21 1530  azithromycin (ZITHROMAX) 500 mg in sodium chloride 0.9 % 250 mL IVPB       ? 500 mg ?250 mL/hr over 60 Minutes Intravenous Every 24 hours 06/09/21 1526 06/14/21 1529  ? ?  ? ? ?Objective: ?Vitals:  ? 06/10/21 0458 06/10/21 0944  ?BP: (!) 137/98 117/66  ?Pulse: 74 64  ?Resp: 20 19  ?Temp: 98.6 ?F (37 ?C) 98.7 ?F (37.1 ?C)  ?  SpO2: (!) 89% 100%  ? ? ?Intake/Output Summary (Last 24 hours) at 06/10/2021 1037 ?Last data filed at 06/10/2021 2831 ?Gross per 24 hour  ?Intake 480 ml  ?Output 1 ml  ?Net 479 ml  ? ?Filed Weights  ? 06/09/21 1417  ?Weight: 90.7 kg  ? ?Weight change:  ?Body mass index is 25.68 kg/m?.  ? ?Physical Exam: ?General exam: Pleasant, elderly Caucasian male.  Not in physical distress ?Skin: No rashes, lesions or ulcers. ?HEENT: Atraumatic, normocephalic, no obvious bleeding ?Lungs: Clear to auscultation bilaterally ?CVS: Regular rate and rhythm, no murmur ?GI/Abd soft, nontender, nondistended, bowel sound present ?CNS: Alert, awake, oriented x3 ?Psychiatry: Mood appropriate ?Extremities: No pedal edema, no calf tenderness ? ?Data Review: I have personally reviewed the laboratory data and studies available. ? ?F/u labs ordered ?Unresulted Labs (From admission, onward)  ? ?  Start     Ordered  ? 06/11/21 0500  Lactic acid, plasma  Tomorrow morning,   R       ? 06/10/21 1033  ? 06/11/21 0500  CBC with Differential/Platelet  Tomorrow morning,   R       ? 06/10/21 1033  ? 06/11/21 0500  Comprehensive metabolic panel  Tomorrow morning,   R       ? 06/10/21 1033  ? 06/11/21 0500  Vitamin B12  (Anemia Panel ([facility]))  Tomorrow morning,   R       ? 06/10/21 1033  ? 06/11/21 0500  Folate  (Anemia Panel ([facility]))  Tomorrow morning,   R       ? 06/10/21 1033   ? 06/11/21 0500  Iron and TIBC  (Anemia Panel ([facility]L))  Tomorrow morning,   R       ? 06/10/21 1033  ? 06/11/21 0500  Ferritin  (Anemia Panel (PNL))  Tomorrow morning,   R       ? 06/10/21 1033  ? 04/05/

## 2021-06-10 NOTE — Evaluation (Signed)
SLP Cancellation Note ? ?Patient Details ?Name: DELANCE WEIDE ?MRN: [id number] ?DOB: 1945/04/26 ? ? ?Cancelled treatment:       Reason Eval/Treat Not Completed: Other (comment) (pt npo for abdominal US, will continue efforts) ? ? ?Macario Golds ?06/10/2021, 2:39 PM ? ?Kathleen Lime, MS CCC SLP ?Acute Rehab Services ?Office [phone] ?Pager [phone] ? ? ?

## 2021-06-11 ENCOUNTER — Ambulatory Visit (HOSPITAL_BASED_OUTPATIENT_CLINIC_OR_DEPARTMENT_OTHER): Payer: Medicare Other | Admitting: Physical Therapy

## 2021-06-11 DIAGNOSIS — K769 Liver disease, unspecified: Secondary | ICD-10-CM

## 2021-06-11 LAB — RETICULOCYTES
Immature Retic Fract: 19 % — ABNORMAL HIGH (ref 2.3–15.9)
RBC.: 2.9 MIL/uL — ABNORMAL LOW (ref 4.22–5.81)
Retic Count, Absolute: 49.6 10*3/uL (ref 19.0–186.0)
Retic Ct Pct: 1.7 % (ref 0.4–3.1)

## 2021-06-11 LAB — COMPREHENSIVE METABOLIC PANEL
ALT: 36 U/L (ref 0–44)
AST: 54 U/L — ABNORMAL HIGH (ref 15–41)
Albumin: 3.2 g/dL — ABNORMAL LOW (ref 3.5–5.0)
Alkaline Phosphatase: 97 U/L (ref 38–126)
Anion gap: 5 (ref 5–15)
BUN: 23 mg/dL (ref 8–23)
CO2: 26 mmol/L (ref 22–32)
Calcium: 9.9 mg/dL (ref 8.9–10.3)
Chloride: 103 mmol/L (ref 98–111)
Creatinine, Ser: 0.77 mg/dL (ref 0.61–1.24)
GFR, Estimated: >60 mL/min (ref >60)
Glucose, Bld: 117 mg/dL — ABNORMAL HIGH (ref 70–99)
Potassium: 4.5 mmol/L (ref 3.5–5.1)
Sodium: 134 mmol/L — ABNORMAL LOW (ref 135–145)
Total Bilirubin: 1.3 mg/dL — ABNORMAL HIGH (ref 0.3–1.2)
Total Protein: 5.8 g/dL — ABNORMAL LOW (ref 6.5–8.1)

## 2021-06-11 LAB — CBC WITH DIFFERENTIAL/PLATELET
Abs Immature Granulocytes: 0.05 10*3/uL (ref 0.00–0.07)
Basophils Absolute: 0 10*3/uL (ref 0.0–0.1)
Basophils Relative: 0 %
Eosinophils Absolute: 0.1 10*3/uL (ref 0.0–0.5)
Eosinophils Relative: 2 %
HCT: 27 % — ABNORMAL LOW (ref 39.0–52.0)
Hemoglobin: 8.7 g/dL — ABNORMAL LOW (ref 13.0–17.0)
Immature Granulocytes: 1 %
Lymphocytes Relative: 8 %
Lymphs Abs: 0.5 10*3/uL — ABNORMAL LOW (ref 0.7–4.0)
MCH: 30 pg (ref 26.0–34.0)
MCHC: 32.2 g/dL (ref 30.0–36.0)
MCV: 93.1 fL (ref 80.0–100.0)
Monocytes Absolute: 0.6 10*3/uL (ref 0.1–1.0)
Monocytes Relative: 10 %
Neutro Abs: 4.7 10*3/uL (ref 1.7–7.7)
Neutrophils Relative %: 79 %
Platelets: 119 10*3/uL — ABNORMAL LOW (ref 150–400)
RBC: 2.9 MIL/uL — ABNORMAL LOW (ref 4.22–5.81)
RDW: 14.2 % (ref 11.5–15.5)
WBC: 6 10*3/uL (ref 4.0–10.5)
nRBC: 0 % (ref 0.0–0.2)

## 2021-06-11 LAB — FOLATE: Folate: 11.6 ng/mL (ref >5.9)

## 2021-06-11 LAB — IRON AND TIBC
Iron: 17 ug/dL — ABNORMAL LOW (ref 45–182)
Saturation Ratios: 7 % — ABNORMAL LOW (ref 17.9–39.5)
TIBC: 253 ug/dL (ref 250–450)
UIBC: 236 ug/dL

## 2021-06-11 LAB — FERRITIN: Ferritin: 140 ng/mL (ref 24–336)

## 2021-06-11 LAB — VITAMIN B12: Vitamin B-12: 201 pg/mL (ref 180–914)

## 2021-06-11 LAB — LACTIC ACID, PLASMA: Lactic Acid, Venous: 0.8 mmol/L (ref 0.5–1.9)

## 2021-06-11 MED ORDER — APIXABAN 5 MG PO TABS
5.0000 mg | ORAL_TABLET | Freq: Two times a day (BID) | ORAL | Status: DC
  Administered 2021-06-11: 5 mg via ORAL
  Filled 2021-06-11: qty 1

## 2021-06-11 MED ORDER — DOCUSATE SODIUM 100 MG PO CAPS: 100.0000 mg | ORAL_CAPSULE | Freq: Two times a day (BID) | ORAL | 2 refills | Status: DC

## 2021-06-11 MED ORDER — AZITHROMYCIN 250 MG PO TABS
500.0000 mg | ORAL_TABLET | Freq: Every day | ORAL | Status: DC
  Administered 2021-06-11: 500 mg via ORAL
  Filled 2021-06-11: qty 2

## 2021-06-11 MED ORDER — AZITHROMYCIN 500 MG PO TABS: ORAL_TABLET | ORAL | 0 refills | Status: DC

## 2021-06-11 MED ORDER — CEFDINIR 300 MG PO CAPS: 300.0000 mg | ORAL_CAPSULE | Freq: Two times a day (BID) | ORAL | 0 refills | Status: AC

## 2021-06-11 NOTE — Hospital Course (Signed)
76 y.o. male with PMH significant for persistent A-fib on Eliquis s/p PPM, HLD, prostate cancer. ?Patient presented to the ED with complaint of fever, cough, congestion. ?He started having fever of 103 at home from Sunday night, had 2 episodes of bloody vomiting.  He also had diarrhea earlier in the week but took Imodium and had no bowel movement in 2 to 3 days. ?He reports that this is her third episode of pneumonia in last 1 year. ?Follows up with Dr. Silas Flood as an outpatient. ?  ?In the ED, patient had temperature 103.1 ?Lactic acid level was elevated to 2, procalcitonin level was elevated to 3.74. ?Respiratory virus panel negative for COVID and influenza PCR ? ?Patient initially required between 1-2L [place] which was weaned to room air ?

## 2021-06-11 NOTE — Clinical Social Work Note (Signed)
?  Transition of Care (TOC) Screening Note ? ? ?Patient Details  ?Name: Todd Chapman ?Date of Birth: August 31, 1945 ? ? ?Transition of Care (TOC) CM/SW Contact:    ?Trish Mage, LCSW ?Phone Number: ?06/11/2021, 11:46 AM ? ? ? ?Transition of Care Department [facility]) has reviewed patient and no TOC needs have been identified at this time. We will continue to monitor patient advancement through interdisciplinary progression rounds. If new patient transition needs arise, please place a TOC consult. ? ? ?

## 2021-06-11 NOTE — Progress Notes (Signed)
PHARMACIST - PHYSICIAN COMMUNICATION ?  ?CONCERNING: Antibiotic IV to Oral Route Change Policy ?  ?RECOMMENDATION: ?This patient is receiving azithromycin  by the intravenous route.  Based on criteria approved by the Pharmacy and Therapeutics Committee, the antibiotic(s) is/are being converted to the equivalent oral dose form(s). ?  ?  ?DESCRIPTION: ? ?These criteria include: ?Patient being treated for a respiratory tract infection, urinary tract infection, cellulitis or clostridium difficile associated diarrhea if on metronidazole ?The patient is not neutropenic and does not exhibit a GI malabsorption state ?The patient is eating (either orally or via tube) and/or has been taking other orally administered medications for a least 24 hours ?The patient is improving clinically and has a Tmax < 100.5 ?  ?If you have questions about this conversion, please contact the Pharmacy Department  ?'[]'$   [phone] )  [facility] ?'[]'$   [phone] )  [facility] ?'[]'$   [phone] )  [facility] ?'[]'$   [phone] )  [facility] ?'[x]'$   [phone] )  [facility]  ? ? ?Jimmy Footman, PharmD, BCPS, BCIDP ?Infectious Diseases Clinical Pharmacist ?Phone: [phone] ?06/11/2021 9:35 AM ? ?

## 2021-06-11 NOTE — Assessment & Plan Note (Signed)
-  Incidental 0.9cm lesion noted on R lobe of liver measuring 0.9cm ?-Earlier CT abd reviewed with distended gall bladder, however no liver abnormality seen ?-Radiology had recommended follow up with non-emergent contrast-enhanced MRI of liver, would defer to primary for f/u ?

## 2021-06-11 NOTE — Discharge Summary (Signed)
?Physician Discharge Summary ?  ?Patient: Todd Chapman MRN: [id number] DOB: 05-16-1945  ?Admit date:     06/09/2021  ?Discharge date: 06/11/21  ?Discharge Physician: Marylu Lund  ? ?PCP: [name], MD  ? ?Recommendations at discharge:  ? ? Follow up with PCP in 1-2 weeks ?Recommend referring for outpatient contrast-enhanced liver MRI to follow up on 0.9cm liver lesion seen incidentally on RUQ [place] ? ?Discharge Diagnoses: ?Principal Problem: ?  Severe sepsis with acute organ dysfunction ([place]) ?Active Problems: ?  Acute respiratory failure with hypoxia ([place]) ?  Constipation ?  Multifocal pneumonia ?  Persistent atrial fibrillation ([place]) ?  Back pain ?  Pacemaker ?  Liver lesion ? ?Resolved Problems: ?  * No resolved hospital problems. * ? ?Hospital Course: ?76 y.o. male with PMH significant for persistent A-fib on Eliquis s/p PPM, HLD, prostate cancer. ?Patient presented to the ED with complaint of fever, cough, congestion. ?He started having fever of 103 at home from Sunday night, had 2 episodes of bloody vomiting.  He also had diarrhea earlier in the week but took Imodium and had no bowel movement in 2 to 3 days. ?He reports that this is her third episode of pneumonia in last 1 year. ?Follows up with Dr. Silas Flood as an outpatient. ?  ?In the ED, patient had temperature 103.1 ?Lactic acid level was elevated to 2, procalcitonin level was elevated to 3.74. ?Respiratory virus panel negative for COVID and influenza PCR ? ?Patient initially required between 1-2L [place] which was weaned to room air ? ?Assessment and Plan: ?* Severe sepsis with acute organ dysfunction ([place]) ?Sepsis with new O2 requirement from multifocal pneumonia. ?PNA pathway ?Empiric rocephin + azithro ?BCx neg ?Respiratory viral panel, covid and flu neg ?Lactate normalized with IVF ? ?Acute respiratory failure with hypoxia ([place]) ?Patient has acute respiratory failure with hypoxia due to having a new oxygen requirement, needing 1-2L [place] ?Successfully  weaned to room air ? ?Multifocal pneumonia ?See above, seen on CT scan. ? ?Constipation ?Continue home Senna scheduled ?Continue home miralax scheduled ?No significant results despite cathartics and pt reported small "hard" stool prior to admit ?Results noted after soap suds enema given  ?Have prescribed stool softener on d/c ? ?Back pain ?Cont home pain meds including: ?PRN hydrocodone ?PRN tylenol ?PRN mobic ?Lidocaine patch ? ?Remained stable. Have ordered stool softener in addition to home stimulant laxative ? ?Persistent atrial fibrillation ([place]) ?Remained rate controlled ?Cont eliquis. No evidence of acute blood loss ? ?Liver lesion ?-Incidental 0.9cm lesion noted on R lobe of liver measuring 0.9cm ?-Earlier CT abd reviewed with distended gall bladder, however no liver abnormality seen ?-Radiology had recommended follow up with non-emergent contrast-enhanced MRI of liver, would defer to primary for f/u ? ? ? ? ?  ? ? ?Consultants:  ?Procedures performed:   ?Disposition: Home ?Diet recommendation:  ?Regular diet ?DISCHARGE MEDICATION: ?Allergies as of 06/11/2021   ? ?   Reactions  ? Atorvastatin Palpitations  ? Ezetimibe Palpitations  ? Morphine And Related Other (See Comments)  ? Pt has hypersensitivity to morphine- makes confused after prolonged usage  ? ?  ? ?  ?Medication List  ?  ? ?TAKE these medications   ? ?acetaminophen 325 MG tablet ?Commonly known as: TYLENOL ?Take 2 tablets (650 mg total) by mouth every 6 (six) hours. ?  ?azithromycin 500 MG tablet ?Commonly known as: ZITHROMAX ?1 tab po daily x 5 more days, zero refills ?  ?calcium carbonate 500 MG chewable tablet ?Commonly known  as: TUMS - dosed in mg elemental calcium ?Chew 2 tablets by mouth daily as needed for indigestion or heartburn. ?  ?cefdinir 300 MG capsule ?Commonly known as: OMNICEF ?Take 1 capsule (300 mg total) by mouth 2 (two) times daily for 5 days. ?  ?docusate sodium 100 MG capsule ?Commonly known as: Colace ?Take 1 capsule (100 mg  total) by mouth 2 (two) times daily. ?  ?Eliquis 5 MG Tabs tablet ?Generic drug: apixaban ?TAKE 1 TABLET(5 MG) BY MOUTH TWICE DAILY ?  ?furosemide 20 MG tablet ?Commonly known as: LASIX ?Take 20 mg by mouth daily. ?  ?HYDROcodone-acetaminophen 5-325 MG tablet ?Commonly known as: Norco ?Take 1 tablet by mouth every 6 (six) hours as needed for moderate pain. ?  ?lidocaine 5 % ?Commonly known as: LIDODERM ?Place 1 patch onto the skin daily. Remove & Discard patch within 12 hours or as directed by MD ?  ?Lidocaine 5 % Crea ?Apply 1 application. topically daily as needed (pain). ?  ?meloxicam 7.5 MG tablet ?Commonly known as: MOBIC ?Take 3.75-7.5 mg by mouth daily as needed for pain. ?  ?multivitamin with minerals Tabs tablet ?Take 1 tablet by mouth daily. ?  ?pantoprazole 40 MG tablet ?Commonly known as: PROTONIX ?Take 40 mg daily by mouth. ?  ?polyethylene glycol 17 g packet ?Commonly known as: MIRALAX / GLYCOLAX ?Take 17 g by mouth daily. ?  ?rosuvastatin 10 MG tablet ?Commonly known as: CRESTOR ?Take 10 mg by mouth every morning. ?  ?senna 8.6 MG Tabs tablet ?Commonly known as: SENOKOT ?Take 1 tablet (8.6 mg total) by mouth daily. ?  ?sodium chloride 0.65 % Soln nasal spray ?Commonly known as: OCEAN ?Place 1 spray into both nostrils at bedtime. ?  ?SYSTANE BALANCE OP ?Place 1 drop into both eyes daily as needed (dry eyes). ?  ?tamsulosin 0.4 MG Caps capsule ?Commonly known as: FLOMAX ?TAKE 1 CAPSULE(0.4 MG) BY MOUTH DAILY AFTER AND SUPPER ?  ?trolamine salicylate 10 % cream ?Commonly known as: ASPERCREME ?Apply 1 application topically daily. ?  ? ?  ? ? Follow-up Information   ? ? [name], MD Follow up in 2 week(s).   ?Specialty: Internal Medicine ?Why: Hospital follow up ?Contact information: ?[address] ?[place] [address] ?[phone] ? ? ?  ?  ? ? Thompson Grayer, MD .   ?Specialty: Cardiology ?Contact information: ?[address] ?[address] ?[place] [address] ?[phone] ? ? ?  ?   ? ?  ?  ? ?  ? ?Discharge Exam: ?Danley Danker Weights  ? 06/09/21 1417  ?Weight: 90.7 kg  ? ?General exam: Awake, laying in bed, in nad ?Respiratory system: Normal respiratory effort, no wheezing ?Cardiovascular system: regular rate, s1, s2 ?Gastrointestinal system: Soft, nondistended, positive BS ?Central nervous system: CN2-12 grossly intact, strength intact ?Extremities: Perfused, no clubbing ?Skin: Normal skin turgor, no notable skin lesions seen ?Psychiatry: Mood normal // no visual hallucinations  ? ? ?Condition at discharge: good ? ?The results of significant diagnostics from this hospitalization (including imaging, microbiology, ancillary and laboratory) are listed below for reference.  ? ?Imaging Studies: ?CT Abdomen Pelvis Wo Contrast ? ?Result Date: 06/09/2021 ?CLINICAL DATA:  Bowel obstruction suspected.  Fever, vomiting. EXAM: CT ABDOMEN AND PELVIS WITHOUT CONTRAST TECHNIQUE: Multidetector CT imaging of the abdomen and pelvis was performed following the standard protocol without IV contrast. RADIATION DOSE REDUCTION: This exam was performed according to the departmental dose-optimization program which includes automated exposure control, adjustment of the mA and/or  kV according to patient size and/or use of iterative reconstruction technique. COMPARISON:  CT examination dated February 06, 2021 FINDINGS: Lower chest: Reticulonodular and patchy opacities in the anterior aspect of the left upper, right middle and right lower lobes concerning for pneumonia. Hepatobiliary: No focal liver abnormality is seen. No gallstones, gallbladder wall thickening, or biliary dilatation. Gallbladder is markedly distended. Pancreas: Unremarkable. No pancreatic ductal dilatation or surrounding inflammatory changes. Spleen: Normal in size without focal abnormality. Adrenals/Urinary Tract: Adrenal glands are unremarkable. Kidneys are normal, without renal calculi, focal lesion, or hydronephrosis. Bladder is unremarkable.  Stomach/Bowel: Stomach is within normal limits. Appendix not visualized. No evidence of bowel wall thickening, distention, or inflammatory changes. Scattered colonic diverticula without evidence of acute diverticulit

## 2021-06-11 NOTE — Progress Notes (Signed)
Went over discharge instructions w/ pt. Pt verbalized understanding.  

## 2021-06-14 LAB — CULTURE, BLOOD (ROUTINE X 2)
Culture: NO GROWTH
Culture: NO GROWTH
Special Requests: ADEQUATE
Special Requests: ADEQUATE

## 2021-06-15 ENCOUNTER — Other Ambulatory Visit: Payer: Self-pay | Admitting: Radiation Oncology

## 2021-06-18 ENCOUNTER — Ambulatory Visit (HOSPITAL_BASED_OUTPATIENT_CLINIC_OR_DEPARTMENT_OTHER): Payer: Medicare Other | Admitting: Physical Therapy

## 2021-06-19 ENCOUNTER — Telehealth: Payer: Self-pay | Admitting: *Deleted

## 2021-06-19 NOTE — Telephone Encounter (Signed)
Mr Todd Chapman requests a refill on his hydrocodone.  ?

## 2021-06-20 ENCOUNTER — Ambulatory Visit (INDEPENDENT_AMBULATORY_CARE_PROVIDER_SITE_OTHER): Payer: Medicare Other | Admitting: Internal Medicine

## 2021-06-20 ENCOUNTER — Encounter (HOSPITAL_BASED_OUTPATIENT_CLINIC_OR_DEPARTMENT_OTHER): Payer: Self-pay | Admitting: Internal Medicine

## 2021-06-20 VITALS — BP 108/60 | HR 66 | Ht 74.0 in | Wt 209.4 lb

## 2021-06-20 DIAGNOSIS — Z950 Presence of cardiac pacemaker: Secondary | ICD-10-CM

## 2021-06-20 DIAGNOSIS — I4819 Other persistent atrial fibrillation: Secondary | ICD-10-CM

## 2021-06-20 DIAGNOSIS — I495 Sick sinus syndrome: Secondary | ICD-10-CM

## 2021-06-20 MED ORDER — HYDROCODONE-ACETAMINOPHEN 5-325 MG PO TABS: 1.0000 | ORAL_TABLET | Freq: Four times a day (QID) | ORAL | 0 refills | Status: DC | PRN

## 2021-06-20 NOTE — Telephone Encounter (Signed)
Dr Dagoberto Ligas note was reviewed.  ?PMP was Reviewed.  ?Hydrocodone e-scribed today.  ?Placed a call to Mr. Astorga, no answer. Left message to return the call with any questions.  ?

## 2021-06-20 NOTE — Patient Instructions (Addendum)
Medication Instructions:  ?Your physician recommends that you continue on your current medications as directed. Please refer to the Current Medication list given to you today. ? ?Labwork: ?None ordered. ? ?Testing/Procedures: ?None ordered. ? ?Follow-Up: ?Your physician wants you to follow-up in: one year with Thompson Grayer, MD  ?You will receive a reminder letter in the mail two months in advance. If you don't receive a letter, please call our office to schedule the follow-up appointment. ? ?Remote monitoring is used to monitor your Pacemaker from home. This monitoring reduces the number of office visits required to check your device to one time per year. It allows [place] to keep an eye on the functioning of your device to ensure it is working properly. You are scheduled for a device check from home on 08/08/2021. You may send your transmission at any time that day. If you have a wireless device, the transmission will be sent automatically. After your physician reviews your transmission, you will receive a postcard with your next transmission date. ? ?Any Other Special Instructions Will Be Listed Below (If Applicable). ? ?If you need a refill on your cardiac medications before your next appointment, please call your pharmacy.  ? ?Important Information About Sugar ? ? ? ? ? ? ? ?

## 2021-06-20 NOTE — Progress Notes (Signed)
? ? ? ?PCP: [name], MD ?  ?Primary EP:  Dr Rayann Heman ? ?Todd Chapman is a 76 y.o. male who presents today for routine electrophysiology followup.  Since last being seen in our clinic, the patient reports doing reasonably well.  He was hospitalized earlier this month with pneumonia.  He has been having fatigue and SOB over the past year.  Cardiac workup last year was unrevealing other than afib.  Today, he denies symptoms of palpitations, chest pain,   lower extremity edema, dizziness, presyncope, or syncope.  The patient is otherwise without complaint today.  ? ?Past Medical History:  ?Diagnosis Date  ? Arthritis   ? "thumbs, back" (02/16/2017)  ? Bradycardia 02/2017  ? Dyspnea   ? with exertion  ? Flu 2 weeks ago  ? GERD (gastroesophageal reflux disease)   ? Hepatitis A as child  ? HLD (hyperlipidemia)   ? Persistent atrial fibrillation ([place]) 12/18/2016  ? Pneumonia 1999; 2018  ? Presence of permanent cardiac pacemaker   ? st jude  ? Prostate cancer ([place])   ? ?Past Surgical History:  ?Procedure Laterality Date  ? APPENDECTOMY    ? age 53  ? CARDIOVERSION  02/16/2017  ? CARDIOVERSION N/A 02/16/2017  ? Procedure: CARDIOVERSION;  Surgeon: Thompson Grayer, MD;  Location: [facility];  Service: Cardiovascular;  Laterality: N/A;  ? COLONOSCOPY WITH PROPOFOL N/A 12/13/2012  ? Procedure: COLONOSCOPY WITH PROPOFOL;  Surgeon: Garlan Fair, MD;  Location: [facility];  Service: Endoscopy;  Laterality: N/A;  ? FRACTURE SURGERY    ? HYDROCELE EXCISION Left 05/16/2018  ? Procedure: HYDROCELECTOMY ADULT;  Surgeon: [name], MD;  Location[facility];  Service: Urology;  Laterality: Left;  ? INSERT / REPLACE / REMOVE PACEMAKER  02/16/2017  ? KYPHOPLASTY N/A 02/10/2021  ? Procedure: KYPHOPLASTY L-5;  Surgeon: Newman Pies, MD;  Location: [facility];  Service: Neurosurgery;  Laterality: N/A;  ? LAPAROSCOPY N/A 06/04/2020  ? Procedure: POSSIBLE LAPAROTOMY POSSIBLE BOWEL RESSECTION;  Surgeon:  Clovis Riley, MD;  Location: [facility];  Service: General;  Laterality: N/A;  ? PACEMAKER IMPLANT N/A 02/16/2017  ? 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 PROSTATE BIOPSY  11/23/2019  ? WRIST FRACTURE SURGERY Left   ? with pins  ? ? ?ROS- all systems are reviewed and negative except as per HPI above ? ?Current Outpatient Medications  ?Medication Sig Dispense Refill  ? acetaminophen (TYLENOL) 325 MG tablet Take 2 tablets (650 mg total) by mouth every 6 (six) hours.    ? calcium carbonate (TUMS - DOSED IN MG ELEMENTAL CALCIUM) 500 MG chewable tablet Chew 2 tablets by mouth daily as needed for indigestion or heartburn.    ? ELIQUIS 5 MG TABS tablet TAKE 1 TABLET(5 MG) BY MOUTH TWICE DAILY 180 tablet 1  ? furosemide (LASIX) 20 MG tablet Take 20 mg by mouth daily.    ? HYDROcodone-acetaminophen (NORCO) 5-325 MG tablet Take 1 tablet by mouth every 6 (six) hours as needed for moderate pain. 120 tablet 0  ? lidocaine (LIDODERM) 5 % Place 1 patch onto the skin daily. Remove & Discard patch within 12 hours or as directed by MD    ? Lidocaine 5 % CREA Apply 1 application. topically daily as needed (pain).    ? meloxicam (MOBIC) 7.5 MG tablet Take 3.75-7.5 mg by mouth daily as needed for pain.    ? Multiple Vitamin (MULTIVITAMIN WITH  MINERALS) TABS tablet Take 1 tablet by mouth daily.    ? Multiple Vitamins-Minerals (PRESERVISION AREDS 2) CAPS 1 capsule    ? pantoprazole (PROTONIX) 40 MG tablet Take 40 mg daily by mouth.  2  ? polyethylene glycol (MIRALAX / GLYCOLAX) 17 g packet Take 17 g by mouth daily. 30 each 0  ? Propylene Glycol (SYSTANE BALANCE OP) Place 1 drop into both eyes daily as needed (dry eyes).    ? rosuvastatin (CRESTOR) 10 MG tablet Take 10 mg by mouth every morning.    ? senna (SENOKOT) 8.6 MG TABS tablet Take 1 tablet (8.6 mg total) by mouth daily. 120 tablet 0  ? sodium chloride (OCEAN) 0.65 % SOLN nasal spray Place 1 spray into  both nostrils at bedtime.    ? tamsulosin (FLOMAX) 0.4 MG CAPS capsule TAKE 1 CAPSULE(0.4 MG) BY MOUTH DAILY AFTER AND SUPPER 30 capsule 5  ? trolamine salicylate (ASPERCREME) 10 % cream Apply 1 application topically daily.    ? azithromycin (ZITHROMAX) 500 MG tablet 1 tab po daily x 5 more days, zero refills (Patient not taking: Reported on 06/20/2021) 5 tablet 0  ? Cholecalciferol (VITAMIN D3) 125 MCG (5000 UT) CAPS 2 capsules    ? docusate sodium (COLACE) 100 MG capsule Take 1 capsule (100 mg total) by mouth 2 (two) times daily. (Patient not taking: Reported on 06/20/2021) 60 capsule 2  ? Lidocaine 5 % CREA 1 application as needed (Patient not taking: Reported on 06/20/2021)    ? Polyethyl Glycol-Propyl Glycol (SYSTANE) 0.4-0.3 % GEL ophthalmic gel See admin instructions.    ? ?No current facility-administered medications for this visit.  ? ? ?Physical Exam: ?Vitals:  ? 06/20/21 1411  ?BP: 108/60  ?Pulse: 66  ?SpO2: 97%  ?Weight: 209 lb 6.4 oz (95 kg)  ?Height: '6\' 2"'$  (1.88 m)  ? ? ?GEN- The patient is well appearing, alert and oriented x 3 today.   ?Head- normocephalic, atraumatic ?Eyes-  Sclera clear, conjunctiva pink ?Ears- hearing intact ?Oropharynx- clear ?Lungs- Clear to ausculation bilaterally, normal work of breathing ?Chest- pacemaker pocket is well healed ?Heart- iRRR ?GI- soft, NT, ND, + BS ?Extremities- no clubbing, cyanosis, or edema ? ?Pacemaker interrogation- reviewed in detail today,  See PACEART report ? ?Echo 07/18/20- EF 60%, severe atrial enlargement ?Myoview 07/18/20- normal study ? ?ekg tracing ordered today is personally reviewed and shows afib with demand V pacing ? ?Assessment and Plan: ? ?1. Symptomatic bradycardia  ?Normal pacemaker function ?See [name] Art report ?Device reprogrammed from VVI 60 bpm to VVI 50 bpm to minimize V pacing ?he is not device dependant today ? ?2. Permanent afib ?Rate controlled ?On eliquis for chads2vasc score of 2 ?May be contributing to his SOB.  We discussed AADs  as an option.  He would like to avoid this currently. ? ?3. SOB  ?Unclear etiology ?Echo and myoview from 2022 reviewed ?Afib as above ?He has had recent pneumonia and has been seen by pulmonary team ?Last PFTS from 2018 were normal.  He may benefit from having these repeated.  I will defer to PCP. ? ?Return in a year ? ?Thompson Grayer MD, FACC ?06/20/2021 ?2:20 PM ? ? ?

## 2021-06-23 ENCOUNTER — Ambulatory Visit (HOSPITAL_BASED_OUTPATIENT_CLINIC_OR_DEPARTMENT_OTHER): Payer: Medicare Other | Admitting: Physical Therapy

## 2021-06-23 DIAGNOSIS — M533 Sacrococcygeal disorders, not elsewhere classified: Secondary | ICD-10-CM | POA: Diagnosis not present

## 2021-06-23 DIAGNOSIS — R17 Unspecified jaundice: Secondary | ICD-10-CM | POA: Diagnosis not present

## 2021-06-23 DIAGNOSIS — M545 Low back pain, unspecified: Secondary | ICD-10-CM | POA: Diagnosis not present

## 2021-06-23 DIAGNOSIS — D649 Anemia, unspecified: Secondary | ICD-10-CM | POA: Diagnosis not present

## 2021-06-23 DIAGNOSIS — R0602 Shortness of breath: Secondary | ICD-10-CM | POA: Diagnosis not present

## 2021-06-25 ENCOUNTER — Ambulatory Visit (HOSPITAL_BASED_OUTPATIENT_CLINIC_OR_DEPARTMENT_OTHER): Payer: Medicare Other | Admitting: Physical Therapy

## 2021-06-30 ENCOUNTER — Encounter (HOSPITAL_BASED_OUTPATIENT_CLINIC_OR_DEPARTMENT_OTHER): Payer: Medicare Other | Admitting: Physical Therapy

## 2021-07-01 ENCOUNTER — Encounter (HOSPITAL_BASED_OUTPATIENT_CLINIC_OR_DEPARTMENT_OTHER): Payer: Self-pay | Admitting: Physical Therapy

## 2021-07-01 ENCOUNTER — Ambulatory Visit (HOSPITAL_BASED_OUTPATIENT_CLINIC_OR_DEPARTMENT_OTHER): Payer: Medicare Other | Attending: Physician Assistant | Admitting: Physical Therapy

## 2021-07-01 DIAGNOSIS — M6281 Muscle weakness (generalized): Secondary | ICD-10-CM | POA: Insufficient documentation

## 2021-07-01 DIAGNOSIS — R262 Difficulty in walking, not elsewhere classified: Secondary | ICD-10-CM | POA: Insufficient documentation

## 2021-07-01 DIAGNOSIS — M5459 Other low back pain: Secondary | ICD-10-CM | POA: Insufficient documentation

## 2021-07-01 NOTE — Therapy (Signed)
?OUTPATIENT PHYSICAL THERAPY TREATMENT NOTE / RECERTIFICATION ? ?Progress Note ?Reporting Period 06/02/2021 to 07/01/2021 ? ?See note below for Objective Data and Assessment of Progress/Goals.  ? ? ? ? ? ?Patient Name: Todd Chapman ?MRN: [id number] ?DOB:[birth year], 76 y.o., male ?Today's Date: 07/02/2021 ? ?PCP: [name], MD ? ? ? PT End of Session - 07/01/21 1446   ? ? Visit Number 19   ? Number of Visits 23   ? Date for PT Re-Evaluation 07/29/21   ? Authorization Type Medicare A&B   ? PT Start Time 4098   ? PT Stop Time 1529   ? PT Time Calculation (min) 50 min   ? Activity Tolerance Patient tolerated treatment well   ? Behavior During Therapy [facility] for tasks assessed/performed   ? ?  ?  ? ?  ? ? ? ? ? ? ? ? ?Past Medical History:  ?Diagnosis Date  ? Arthritis   ? "thumbs, back" (02/16/2017)  ? Bradycardia 02/2017  ? Dyspnea   ? with exertion  ? Flu 2 weeks ago  ? GERD (gastroesophageal reflux disease)   ? Hepatitis A as child  ? HLD (hyperlipidemia)   ? Persistent atrial fibrillation ([place]) 12/18/2016  ? Pneumonia 1999; 2018  ? Presence of permanent cardiac pacemaker   ? st jude  ? Prostate cancer ([place])   ? ?Past Surgical History:  ?Procedure Laterality Date  ? APPENDECTOMY    ? age 41  ? CARDIOVERSION  02/16/2017  ? CARDIOVERSION N/A 02/16/2017  ? Procedure: CARDIOVERSION;  Surgeon: Thompson Grayer, MD;  Location: [place] [facility];  Service: Cardiovascular;  Laterality: N/A;  ? COLONOSCOPY WITH PROPOFOL N/A 12/13/2012  ? Procedure: COLONOSCOPY WITH PROPOFOL;  Surgeon: Garlan Fair, MD;  [facility];  Service: Endoscopy;  Laterality: N/A;  ? FRACTURE SURGERY    ? HYDROCELE EXCISION Left 05/16/2018  ? Procedure: HYDROCELECTOMY ADULT;  Surgeon: [name], MD;  Location: [facility];  Service: Urology;  Laterality: Left;  ? INSERT / REPLACE / REMOVE PACEMAKER  02/16/2017  ? KYPHOPLASTY N/A 02/10/2021  ? Procedure: KYPHOPLASTY L-5;  Surgeon: Newman Pies, MD;  Location: [place];   Service: Neurosurgery;  Laterality: N/A;  ? LAPAROSCOPY N/A 06/04/2020  ? Procedure: POSSIBLE LAPAROTOMY POSSIBLE BOWEL RESSECTION;  Surgeon: Clovis Riley, MD;  Location: [facility];  Service: General;  Laterality: N/A;  ? PACEMAKER IMPLANT N/A 02/16/2017  ? 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 PROSTATE BIOPSY  11/23/2019  ? WRIST FRACTURE SURGERY Left   ? with pins  ? ?Patient Active Problem List  ? Diagnosis Date Noted  ? Liver lesion 06/11/2021  ? Severe sepsis with acute organ dysfunction ([place]) 06/09/2021  ? Multifocal pneumonia 06/09/2021  ? Acute respiratory failure with hypoxia ([place]) 06/09/2021  ? Lumbar radiculopathy 05/02/2021  ? Vertebral compression fracture ([place]) 02/12/2021  ? Bilateral acetabular fractures, closed, initial encounter ([place]) 02/08/2021  ? Closed compression fracture of L5 lumbar vertebra, initial encounter ([place]) 02/08/2021  ? Back pain 02/06/2021  ? SBO (small bowel obstruction) ([place]) 06/01/2020  ? Pacemaker 05/13/2020  ? Malignant neoplasm of prostate ([place]) 01/09/2020  ? Annual physical exam 01/08/2020  ? Acute upper respiratory infection 01/08/2020  ? Asymptomatic varicose veins of lower extremity 01/08/2020  ? Bronchitis 01/08/2020  ? Cataract 01/08/2020  ? Constipation 01/08/2020  ? Diverticulosis of large intestine without perforation or abscess without bleeding 01/08/2020  ? Screening for gout  01/08/2020  ? False positive serological test for syphilis 01/08/2020  ? Gastro-esophageal reflux disease without esophagitis 01/08/2020  ? 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 History of colonic polyps 01/08/2020  ? Impacted cerumen 01/08/2020  ? Insomnia 01/08/2020  ? Osteoarthritis 01/08/2020  ? Polypharmacy 01/08/2020  ? Pure hypercholesterolemia 01/08/2020  ? Rosacea 01/08/2020  ? Combined forms of age-related cataract of both eyes 07/13/2019  ? Elevated blood pressure reading 11/08/2018  ? Sick sinus syndrome  ([place]) 02/16/2017  ? Persistent atrial fibrillation ([place]) 12/18/2016  ? 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 Nonexudative age-related macular degeneration, bilateral, 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  ? Dermatochalasis of both upper eyelids 07/25/2014  ? Nuclear sclerosis of both eyes 07/25/2014  ? Bradycardia 05/29/2014  ? Premature atrial contractions 05/29/2014  ? Premature junctional contractions ([place]) 05/29/2014  ? Hyperlipidemia 05/29/2014  ? Encounter for general adult medical examination with abnormal findings 04/24/2014  ? Vitamin D deficiency 04/24/2014  ? ? ?REFERRING PROVIDER: Barbie Banner, PA-C  ?  ?REFERRING DIAG: M48.50XA (ICD-10-CM) - Collapsed vertebra, not elsewhere classified, site unspecified, initial encounter for fracture  ?  ?THERAPY DIAG:  ?Other Low Back Pain ?  ?Muscle weakness (generalized) ?  ?Difficulty in walking, not elsewhere classified ?  ?ONSET DATE: Bilateral L5 kyphoplasty and vertebral body biopsies on 02/10/2021 ?  ?SUBJECTIVE:                                                                                                                                                                                          ?  ?SUBJECTIVE STATEMENT: ?-Pt states he got sick after going to a car show.  Pt presented to the ER in early April and was admitted [facility].  He had a fever of 103 and was vomiting.  He was in the hospital for approx 3 days. MD notes indicated Severe sepsis with acute organ dysfunction,  Acute respiratory failure with hypoxia, ? Multifocal pneumonia,  Persistent atrial fibrillation, and Liver lesion.  Pt states they did a culture and he did not have sepsis.  Pt was on 1-2L of O2 while at ER and in the hospital.  They also did an [place] and found pt had a liver lesion and was referred to PCP.  He states his PCP doesn't think it's anything but is going to have a MRI.  Pt states he had his pacemaker checked on 06/20/2021 and he stated everything looked good.  Pt has a new MD  script for PT from Okey Dupre on 06/26/2021 who will be his new primary.  ? ?-Pt states he went to see his surgeon and saw  the PA.  He was informed he could perform a gym program and had no weight restrictions.  He was told to listen to his MD.  ? ?-Pt states he has been walking but not too long at a time.  Pt reports he has less pain in SI with ambulation.  Pt is taking meloxicam and is not taking oxycodone.  Pt reports improved thigh strength and improved stamina.  Pt has returned to gym exercises/program.  He has been performing gym exercises including lat pulldown machine, DB curls, knee ext machine, HS curl machine.  Pt states he felt good after aquatic Rx and did well with aquatic exercises.  ? ?-Pt is limited with extended ambulation distance.  He can walk 1/4 mile though has to stop 1-2 times.   Pt has pain with sitting.  He states he favors his R LE later in the day with gait.  Pt reports having pain with sitting too long.  ? ?   ? ? ?PERTINENT HISTORY:  ?02/10/2021 bilat L5 kyphoplasty,  bilat acetabular stress fractures and pubic rami stress Fx; Grade 1 anterolisthesis at L4-L5; PACEMAKER, A-fib, hx of prostate CA  ?  ?PAIN:  ?Are you having pain? Yes ?NPRS scale: 4/10 current, 3/10 best, 7/10 worst  ?Pain location:  R SI area / lumbar  ?  ?PRECAUTIONS: Back--No bending, lifting > 10# , twisting ?  ?WEIGHT BEARING RESTRICTIONS No ?  ?  ?PATIENT GOALS to return to the gym; improve functional mobility ?  ?  ?OBJECTIVE:  ?  ?DIAGNOSTIC FINDINGS: (In Epic) ?-MRI on 02/07/2021 (prior to surgery) which showed Grade 1 anterolisthesis at L4-L5 and L5 inferior endplate compression fracture with less than 50% loss of height. ?-Earlier MRI showed Moderate osteoarthritis of bilateral hips and bilat acetabular stress/insufficiency fractures.  MRI indicated differential considerations include severe stress reaction possible developing insufficiency fracture versus underlying malignancy secondary to metastatic  disease given the patient's history of prostate cancer. ?-MRI pelvis: 1. Bilateral acetabular/supra-acetabular stress fractures in the pelvis. There is likewise some stress fracture involving the left media

## 2021-07-02 ENCOUNTER — Ambulatory Visit (HOSPITAL_BASED_OUTPATIENT_CLINIC_OR_DEPARTMENT_OTHER): Payer: Medicare Other | Admitting: Physical Therapy

## 2021-07-02 ENCOUNTER — Encounter (HOSPITAL_BASED_OUTPATIENT_CLINIC_OR_DEPARTMENT_OTHER): Payer: Self-pay

## 2021-07-11 ENCOUNTER — Other Ambulatory Visit: Payer: Self-pay | Admitting: Internal Medicine

## 2021-07-11 DIAGNOSIS — K7689 Other specified diseases of liver: Secondary | ICD-10-CM

## 2021-07-15 ENCOUNTER — Ambulatory Visit (HOSPITAL_BASED_OUTPATIENT_CLINIC_OR_DEPARTMENT_OTHER): Payer: Medicare Other | Attending: Physician Assistant | Admitting: Physical Therapy

## 2021-07-15 DIAGNOSIS — M6281 Muscle weakness (generalized): Secondary | ICD-10-CM | POA: Insufficient documentation

## 2021-07-15 DIAGNOSIS — R262 Difficulty in walking, not elsewhere classified: Secondary | ICD-10-CM | POA: Insufficient documentation

## 2021-07-15 DIAGNOSIS — M5459 Other low back pain: Secondary | ICD-10-CM | POA: Diagnosis not present

## 2021-07-15 NOTE — Therapy (Signed)
?OUTPATIENT PHYSICAL THERAPY TREATMENT NOTE   ? ? ? ? ? ?Patient Name: Todd Chapman ?MRN: [id number] ?DOB:1945/09/11, 76 y.o., male ?Today's Date: 07/16/2021 ? ?PCP: [name], MD ? ? ? PT End of Session - 07/15/21 1617   ? ? Visit Number 20   ? Number of Visits 23   ? Date for PT Re-Evaluation 07/29/21   ? Authorization Type Medicare A&B   ? PT Start Time 1521   ? PT Stop Time 9528   ? PT Time Calculation (min) 45 min   ? Activity Tolerance Patient tolerated treatment well   ? Behavior During Therapy [facility] for tasks assessed/performed   ? ?  ?  ? ?  ? ? ? ? ? ? ? ? ? ?Past Medical History:  ?Diagnosis Date  ? Arthritis   ? "thumbs, back" (02/16/2017)  ? Bradycardia 02/2017  ? Dyspnea   ? with exertion  ? Flu 2 weeks ago  ? GERD (gastroesophageal reflux disease)   ? Hepatitis A as child  ? HLD (hyperlipidemia)   ? Persistent atrial fibrillation ([place]) 12/18/2016  ? Pneumonia 1999; 2018  ? Presence of permanent cardiac pacemaker   ? st jude  ? Prostate cancer ([place])   ? ?Past Surgical History:  ?Procedure Laterality Date  ? APPENDECTOMY    ? age 75  ? CARDIOVERSION  02/16/2017  ? CARDIOVERSION N/A 02/16/2017  ? Procedure: CARDIOVERSION;  Surgeon: Thompson Grayer, MD;  Location: [place] [facility];  Service: Cardiovascular;  Laterality: N/A;  ? COLONOSCOPY WITH PROPOFOL N/A 12/13/2012  ? Procedure: COLONOSCOPY WITH PROPOFOL;  Surgeon: Garlan Fair, MD;  [facility];  Service: Endoscopy;  Laterality: N/A;  ? FRACTURE SURGERY    ? HYDROCELE EXCISION Left 05/16/2018  ? Procedure: HYDROCELECTOMY ADULT;  Surgeon: [name], MD;  Location: [facility];  Service: Urology;  Laterality: Left;  ? INSERT / REPLACE / REMOVE PACEMAKER  02/16/2017  ? KYPHOPLASTY N/A 02/10/2021  ? Procedure: KYPHOPLASTY L-5;  Surgeon: Newman Pies, MD;  Location: [place];  Service: Neurosurgery;  Laterality: N/A;  ? LAPAROSCOPY N/A 06/04/2020  ? Procedure: POSSIBLE LAPAROTOMY POSSIBLE BOWEL RESSECTION;  Surgeon:  Clovis Riley, MD;  [facility];  Service: General;  Laterality: N/A;  ? PACEMAKER IMPLANT N/A 02/16/2017  ? 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 PROSTATE BIOPSY  11/23/2019  ? WRIST FRACTURE SURGERY Left   ? with pins  ? ?Patient Active Problem List  ? Diagnosis Date Noted  ? Liver lesion 06/11/2021  ? Severe sepsis with acute organ dysfunction ([place]) 06/09/2021  ? Multifocal pneumonia 06/09/2021  ? Acute respiratory failure with hypoxia ([place]) 06/09/2021  ? Lumbar radiculopathy 05/02/2021  ? Vertebral compression fracture ([place]) 02/12/2021  ? Bilateral acetabular fractures, closed, initial encounter ([place]) 02/08/2021  ? Closed compression fracture of L5 lumbar vertebra, initial encounter ([place]) 02/08/2021  ? Back pain 02/06/2021  ? SBO (small bowel obstruction) ([place]) 06/01/2020  ? Pacemaker 05/13/2020  ? Malignant neoplasm of prostate ([place]) 01/09/2020  ? Annual physical exam 01/08/2020  ? Acute upper respiratory infection 01/08/2020  ? Asymptomatic varicose veins of lower extremity 01/08/2020  ? Bronchitis 01/08/2020  ? Cataract 01/08/2020  ? Constipation 01/08/2020  ? Diverticulosis of large intestine without perforation or abscess without bleeding 01/08/2020  ? Screening for gout 01/08/2020  ? False positive serological test for syphilis 01/08/2020  ? Gastro-esophageal reflux disease without esophagitis 01/08/2020  ?  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 History of colonic polyps 01/08/2020  ? Impacted cerumen 01/08/2020  ? Insomnia 01/08/2020  ? Osteoarthritis 01/08/2020  ? Polypharmacy 01/08/2020  ? Pure hypercholesterolemia 01/08/2020  ? Rosacea 01/08/2020  ? Combined forms of age-related cataract of both eyes 07/13/2019  ? Elevated blood pressure reading 11/08/2018  ? Sick sinus syndrome ([place]) 02/16/2017  ? Persistent atrial fibrillation ([place]) 12/18/2016  ? 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 Nonexudative age-related  macular degeneration, bilateral, 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  ? Dermatochalasis of both upper eyelids 07/25/2014  ? Nuclear sclerosis of both eyes 07/25/2014  ? Bradycardia 05/29/2014  ? Premature atrial contractions 05/29/2014  ? Premature junctional contractions ([place]) 05/29/2014  ? Hyperlipidemia 05/29/2014  ? Encounter for general adult medical examination with abnormal findings 04/24/2014  ? Vitamin D deficiency 04/24/2014  ? ? ?REFERRING PROVIDER: Barbie Banner, PA-C  ?  ?REFERRING DIAG: M48.50XA (ICD-10-CM) - Collapsed vertebra, not elsewhere classified, site unspecified, initial encounter for fracture  ?  ?THERAPY DIAG:  ?Other Low Back Pain ?  ?Muscle weakness (generalized) ?  ?Difficulty in walking, not elsewhere classified ?  ?ONSET DATE: Bilateral L5 kyphoplasty and vertebral body biopsies on 02/10/2021 ?  ?SUBJECTIVE:                                                                                                                                                                                          ?  ?SUBJECTIVE STATEMENT: ?Pt has a new MD script for PT from Okey Dupre on 06/26/2021 who will be his new primary.  ? ?Pt apologizes for missing last PT Rx; he just forgot.  Pt states he has been traveling for work and c/o's of increased pain due to traveling.  Pt has increased pain with ambulation.  Pt states he was feeling better prior to being sick when he was starting the pool and gym exercises.  Pt states he feels so much better walking in the water. ?  ? ? ?PERTINENT HISTORY:  ?02/10/2021 bilat L5 kyphoplasty,  bilat acetabular stress fractures and pubic rami stress Fx; Grade 1 anterolisthesis at L4-L5; PACEMAKER, A-fib, hx of prostate CA  ?  ?PAIN:  ?Are you having pain? Yes ?NPRS scale: 7/10 current, 3/10 best, 7/10 worst  ?Pain location:  R SI area / lumbar  ?  ?PRECAUTIONS: Back--No bending, lifting > 10# , twisting ?  ?WEIGHT BEARING RESTRICTIONS No ?  ?  ?PATIENT GOALS to return to the  gym; improve functional mobility ?  ?  ?OBJECTIVE:  ?  ?DIAGNOSTIC FINDINGS: (In Epic) ?-MRI on 02/07/2021 (prior to surgery) which showed Grade 1  anterolisthesis at L4-L5 and L5 inferior endplate compression fracture with less than 50% loss of height. ?-Earlier MRI showed Moderate osteoarthritis of bilateral hips and bilat acetabular stress/insufficiency fractures.  MRI indicated differential considerations include severe stress reaction possible developing insufficiency fracture versus underlying malignancy secondary to metastatic disease given the patient's history of prostate cancer. ?-MRI pelvis: 1. Bilateral acetabular/supra-acetabular stress fractures in the pelvis. There is likewise some stress fracture involving the left medial superior pubic ramus. This overall represents a progression compared to the previous unilateral left acetabular stress fracture shown on 11/04/2020. ? ? ? ?  ?TODAY'S TREATMENT  ?  ?Pt seen for aquatic therapy today.  Treatment took place in water 3.25-4 ft 4 in depth at the [employer] pool. Temp of water was 91?.  Pt entered/exited the pool via stairs step through pattern independently with bilat rail. ?Pt ambulated fwd and bwd x 2 reps and sidestepped x 2 reps ?Kick board push pull with TrA 2x12-15 ?Shoulder extension with TrA with yellow noodle x 10 reps and x 5 reps, blue noodle x 5 reps ?Pt ambulated fwd x 2 laps ?Seated bicycles 2x20 reps ?Plank on bench x 30 seconds  ?Plank on yellow noodle 2 x 20-30s hold ?2 widths ambulating with yellow buoys submerged for core engage  ?  ?Pt requires buoyancy for support and to offload joints with strengthening exercises. Viscosity of the water is needed for resistance of strengthening; water current perturbations provides challenge to standing balance unsupported, requiring increased core activation.  Water will allow for reduced gait deviation due to reduced joint loading through buoyancy to help patient improve posture without  excess stress and pain.   ? ? ?   ?PATIENT EDUCATION:  ?Education details:  aquatic PT process and benefits.  PT answered questions.  POC.  exercise form/rationale.   ?Education method: Explanation, demonstration

## 2021-07-16 ENCOUNTER — Telehealth: Payer: Self-pay | Admitting: *Deleted

## 2021-07-16 ENCOUNTER — Encounter (HOSPITAL_BASED_OUTPATIENT_CLINIC_OR_DEPARTMENT_OTHER): Payer: Self-pay | Admitting: Physical Therapy

## 2021-07-16 NOTE — Telephone Encounter (Signed)
Requesting a refill of his hydrocodone. 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 Last fill date was 06/20/21. ?

## 2021-07-17 MED ORDER — HYDROCODONE-ACETAMINOPHEN 5-325 MG PO TABS: 1.0000 | ORAL_TABLET | Freq: Four times a day (QID) | ORAL | 0 refills | Status: DC | PRN

## 2021-07-17 NOTE — Telephone Encounter (Signed)
Patient is calling stating he called yesterday to request refill on Hydrocodone. He stated he is going out of town Friday and wants to get his refill before he leaves. Per PMP, last fill was 06/20/21 ?

## 2021-07-18 NOTE — Telephone Encounter (Signed)
Notified of refill. 

## 2021-07-22 ENCOUNTER — Ambulatory Visit (HOSPITAL_BASED_OUTPATIENT_CLINIC_OR_DEPARTMENT_OTHER): Payer: Medicare Other | Admitting: Physical Therapy

## 2021-07-22 DIAGNOSIS — M6281 Muscle weakness (generalized): Secondary | ICD-10-CM

## 2021-07-22 DIAGNOSIS — M5459 Other low back pain: Secondary | ICD-10-CM

## 2021-07-22 DIAGNOSIS — R262 Difficulty in walking, not elsewhere classified: Secondary | ICD-10-CM

## 2021-07-22 NOTE — Therapy (Signed)
?OUTPATIENT PHYSICAL THERAPY TREATMENT NOTE   ? ? ? ? ? ?Patient Name: Todd Chapman ?MRN: [id number] ?DOB:[birth year], 76 y.o., male ?Today's Date: 07/23/2021 ? ?PCP: [name], MD ? ? ? PT End of Session - 07/22/21 1617   ? ? Visit Number 21   ? Number of Visits 23   ? Date for PT Re-Evaluation 07/29/21   ? Authorization Type Medicare A&B   ? PT Start Time 1525   ? PT Stop Time 1751   ? PT Time Calculation (min) 40 min   ? Activity Tolerance Patient tolerated treatment well   ? Behavior During Therapy [facility] for tasks assessed/performed   ? ?  ?  ? ?  ? ? ? ? ? ? ? ? ? ? ?Past Medical History:  ?Diagnosis Date  ? Arthritis   ? "thumbs, back" (02/16/2017)  ? Bradycardia 02/2017  ? Dyspnea   ? with exertion  ? Flu 2 weeks ago  ? GERD (gastroesophageal reflux disease)   ? Hepatitis A as child  ? HLD (hyperlipidemia)   ? Persistent atrial fibrillation ([place]) 12/18/2016  ? Pneumonia 1999; 2018  ? Presence of permanent cardiac pacemaker   ? st jude  ? Prostate cancer ([place])   ? ?Past Surgical History:  ?Procedure Laterality Date  ? APPENDECTOMY    ? age 47  ? CARDIOVERSION  02/16/2017  ? CARDIOVERSION N/A 02/16/2017  ? Procedure: CARDIOVERSION;  Surgeon: Thompson Grayer, MD;  Location: [place] [facility];  Service: Cardiovascular;  Laterality: N/A;  ? COLONOSCOPY WITH PROPOFOL N/A 12/13/2012  ? Procedure: COLONOSCOPY WITH PROPOFOL;  Surgeon: Garlan Fair, MD;  [facility]: [facility];  Service: Endoscopy;  Laterality: N/A;  ? FRACTURE SURGERY    ? HYDROCELE EXCISION Left 05/16/2018  ? Procedure: HYDROCELECTOMY ADULT;  Surgeon: [name], MD;  [facility];  Service: Urology;  Laterality: Left;  ? INSERT / REPLACE / REMOVE PACEMAKER  02/16/2017  ? KYPHOPLASTY N/A 02/10/2021  ? Procedure: KYPHOPLASTY L-5;  Surgeon: Newman Pies, MD;  Location: [place];  Service: Neurosurgery;  Laterality: N/A;  ? LAPAROSCOPY N/A 06/04/2020  ? Procedure: POSSIBLE LAPAROTOMY POSSIBLE BOWEL RESSECTION;  Surgeon:  Clovis Riley, MD;  Location: [facility];  Service: General;  Laterality: N/A;  ? PACEMAKER IMPLANT N/A 02/16/2017  ? 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 PROSTATE BIOPSY  11/23/2019  ? WRIST FRACTURE SURGERY Left   ? with pins  ? ?Patient Active Problem List  ? Diagnosis Date Noted  ? Liver lesion 06/11/2021  ? Severe sepsis with acute organ dysfunction ([place]) 06/09/2021  ? Multifocal pneumonia 06/09/2021  ? Acute respiratory failure with hypoxia ([place]) 06/09/2021  ? Lumbar radiculopathy 05/02/2021  ? Vertebral compression fracture ([place]) 02/12/2021  ? Bilateral acetabular fractures, closed, initial encounter ([place]) 02/08/2021  ? Closed compression fracture of L5 lumbar vertebra, initial encounter ([place]) 02/08/2021  ? Back pain 02/06/2021  ? SBO (small bowel obstruction) ([place]) 06/01/2020  ? Pacemaker 05/13/2020  ? Malignant neoplasm of prostate ([place]) 01/09/2020  ? Annual physical exam 01/08/2020  ? Acute upper respiratory infection 01/08/2020  ? Asymptomatic varicose veins of lower extremity 01/08/2020  ? Bronchitis 01/08/2020  ? Cataract 01/08/2020  ? Constipation 01/08/2020  ? Diverticulosis of large intestine without perforation or abscess without bleeding 01/08/2020  ? Screening for gout 01/08/2020  ? False positive serological test for syphilis 01/08/2020  ? Gastro-esophageal reflux disease without esophagitis 01/08/2020  ?  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 History of colonic polyps 01/08/2020  ? Impacted cerumen 01/08/2020  ? Insomnia 01/08/2020  ? Osteoarthritis 01/08/2020  ? Polypharmacy 01/08/2020  ? Pure hypercholesterolemia 01/08/2020  ? Rosacea 01/08/2020  ? Combined forms of age-related cataract of both eyes 07/13/2019  ? Elevated blood pressure reading 11/08/2018  ? Sick sinus syndrome ([place]) 02/16/2017  ? Persistent atrial fibrillation ([place]) 12/18/2016  ? 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 Nonexudative age-related  macular degeneration, bilateral, 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  ? Dermatochalasis of both upper eyelids 07/25/2014  ? Nuclear sclerosis of both eyes 07/25/2014  ? Bradycardia 05/29/2014  ? Premature atrial contractions 05/29/2014  ? Premature junctional contractions ([place]) 05/29/2014  ? Hyperlipidemia 05/29/2014  ? Encounter for general adult medical examination with abnormal findings 04/24/2014  ? Vitamin D deficiency 04/24/2014  ? ? ?REFERRING PROVIDER: Barbie Banner, PA-C  ?  ?REFERRING DIAG: M48.50XA (ICD-10-CM) - Collapsed vertebra, not elsewhere classified, site unspecified, initial encounter for fracture  ?  ?THERAPY DIAG:  ?Other Low Back Pain ?  ?Muscle weakness (generalized) ?  ?Difficulty in walking, not elsewhere classified ?  ?ONSET DATE: Bilateral L5 kyphoplasty and vertebral body biopsies on 02/10/2021 ?  ?SUBJECTIVE:                                                                                                                                                                                          ?  ?SUBJECTIVE STATEMENT: ?Pt has a new MD script for PT from Okey Dupre on 06/26/2021 who will be his new primary.  ?Pt denies any adverse effects after prior aquatic Rx.  Pt states he felt improvement from the pool Rx.  Pt reports he had some soreness with some of the weighted exercises he does at home. Pt reports he is pretty sore today from work.  Pt reports pain with ambulation.   ?  ? ?PERTINENT HISTORY:  ?02/10/2021 bilat L5 kyphoplasty,  bilat acetabular stress fractures and pubic rami stress Fx; Grade 1 anterolisthesis at L4-L5; PACEMAKER, A-fib, hx of prostate CA  ?  ?PAIN:  ?Are you having pain? Yes ?NPRS scale: 5-6/10 current, 3/10 best, 7/10 worst  ?Pain location:  R SI area / lumbar  ?  ?PRECAUTIONS: Back--No bending, lifting > 10# , twisting ?  ?WEIGHT BEARING RESTRICTIONS No ?  ?  ?PATIENT GOALS to return to the gym; improve functional mobility ?  ?  ?OBJECTIVE:  ?  ?DIAGNOSTIC  FINDINGS: (In Epic) ?-MRI on 02/07/2021 (prior to surgery) which showed Grade 1 anterolisthesis at L4-L5 and L5 inferior endplate compression fracture with less than 50% loss of  height. ?-Earlier MRI showed Moderate osteoarthritis of bilateral hips and bilat acetabular stress/insufficiency fractures.  MRI indicated differential considerations include severe stress reaction possible developing insufficiency fracture versus underlying malignancy secondary to metastatic disease given the patient's history of prostate cancer. ?-MRI pelvis: 1. Bilateral acetabular/supra-acetabular stress fractures in the pelvis. There is likewise some stress fracture involving the left medial superior pubic ramus. This overall represents a progression compared to the previous unilateral left acetabular stress fracture shown on 11/04/2020. ? ? ? ?  ?TODAY'S TREATMENT  ?  ?Pt seen for aquatic therapy today.  Treatment took place in water 3.25-4 ft 4 in depth at the [employer] [facility] pool. Temp of water was 91?.  Pt entered/exited the pool via stairs step through pattern independently with bilat rail. ?Pt ambulated fwd and bwd x 2 reps and sidestepped x 2 reps ?Kick board push pull with TrA 2x12-15 ?Shoulder extension with TrA with blue noodle x 15 reps and with blue squoodle x 15 reps ?Mini squats with TrA 2x15 reps ?Standing HS stretch on step with UE support on bilat rails 2x20 sec bilat ?Seated bicycles 2x20 reps ?Plank on bench x 30 seconds  ?Plank on yellow noodle 2 x 20-25s hold with noodle supported against wall ?Pt ambulated fwd x 2 laps ?  ?  ?Pt requires buoyancy for support and to offload joints with strengthening exercises. Viscosity of the water is needed for resistance of strengthening; water current perturbations provides challenge to standing balance unsupported, requiring increased core activation.  Water will allow for reduced gait deviation due to reduced joint loading through buoyancy to help patient improve posture  without excess stress and pain.   ? ? ?   ?PATIENT EDUCATION:  ?Education details:  aquatic PT process and benefits.  PT answered questions.  POC.  exercise form/rationale.   ?Education method: Explanation, Consulting civil engineer

## 2021-07-23 ENCOUNTER — Encounter (HOSPITAL_BASED_OUTPATIENT_CLINIC_OR_DEPARTMENT_OTHER): Payer: Self-pay | Admitting: Physical Therapy

## 2021-07-24 ENCOUNTER — Other Ambulatory Visit (HOSPITAL_COMMUNITY): Payer: Self-pay | Admitting: Internal Medicine

## 2021-07-24 DIAGNOSIS — K7689 Other specified diseases of liver: Secondary | ICD-10-CM

## 2021-07-28 DIAGNOSIS — C61 Malignant neoplasm of prostate: Secondary | ICD-10-CM | POA: Diagnosis not present

## 2021-07-28 DIAGNOSIS — R3912 Poor urinary stream: Secondary | ICD-10-CM | POA: Diagnosis not present

## 2021-07-28 DIAGNOSIS — N401 Enlarged prostate with lower urinary tract symptoms: Secondary | ICD-10-CM | POA: Diagnosis not present

## 2021-07-28 DIAGNOSIS — R351 Nocturia: Secondary | ICD-10-CM | POA: Diagnosis not present

## 2021-07-30 ENCOUNTER — Ambulatory Visit (HOSPITAL_BASED_OUTPATIENT_CLINIC_OR_DEPARTMENT_OTHER): Payer: Medicare Other | Admitting: Physical Therapy

## 2021-07-30 DIAGNOSIS — M5459 Other low back pain: Secondary | ICD-10-CM | POA: Diagnosis not present

## 2021-07-30 DIAGNOSIS — M6281 Muscle weakness (generalized): Secondary | ICD-10-CM | POA: Diagnosis not present

## 2021-07-30 DIAGNOSIS — R262 Difficulty in walking, not elsewhere classified: Secondary | ICD-10-CM

## 2021-07-30 NOTE — Therapy (Incomplete)
OUTPATIENT PHYSICAL THERAPY TREATMENT NOTE        Patient Name: Todd Chapman MRN: [id number] DOB:[birth year], 76 y.o., male Today's Date: 07/31/2021  PCP: [name], MD    PT End of Session - 07/30/21 1625     Visit Number 22    Number of Visits 25    Date for PT Re-Evaluation 08/27/21    Authorization Type Medicare A&B    PT Start Time 1530    PT Stop Time 7322    PT Time Calculation (min) 39 min    Activity Tolerance --   limited due to pain   Behavior During Therapy [facility] for tasks assessed/performed                      Past Medical History:  Diagnosis Date   Arthritis    "thumbs, back" (02/16/2017)   Bradycardia 02/2017   Dyspnea    with exertion   Flu 2 weeks ago   GERD (gastroesophageal reflux disease)    Hepatitis A as child   HLD (hyperlipidemia)    Persistent atrial fibrillation ([place]) 12/18/2016   Pneumonia 1999; 2018   Presence of permanent cardiac pacemaker    st jude   Prostate cancer [facility])    Past Surgical History:  Procedure Laterality Date   APPENDECTOMY     age 81   CARDIOVERSION  02/16/2017   CARDIOVERSION N/A 02/16/2017   Procedure: CARDIOVERSION;  Surgeon: Thompson Grayer, MD;  Location: [place] [facility];  Service: Cardiovascular;  Laterality: N/A;   COLONOSCOPY WITH PROPOFOL N/A 12/13/2012   Procedure: COLONOSCOPY WITH PROPOFOL;  Surgeon: Garlan Fair, MD;  [facility];  Service: Endoscopy;  Laterality: N/A;   FRACTURE SURGERY     HYDROCELE EXCISION Left 05/16/2018   Procedure: HYDROCELECTOMY ADULT;  Surgeon: [name], MD;  Location: [facility];  Service: Urology;  Laterality: Left;   INSERT / REPLACE / REMOVE PACEMAKER  02/16/2017   KYPHOPLASTY N/A 02/10/2021   Procedure: KYPHOPLASTY L-5;  Surgeon: Newman Pies, MD;  Location: [place];  Service: Neurosurgery;  Laterality: N/A;   LAPAROSCOPY N/A 06/04/2020   Procedure: POSSIBLE LAPAROTOMY POSSIBLE BOWEL RESSECTION;  Surgeon: Clovis Riley, MD;  [facility];  Service: General;  Laterality: N/A;   PACEMAKER IMPLANT N/A 02/16/2017   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 by Dr Rayann Heman   PROSTATE BIOPSY  11/23/2019   WRIST FRACTURE SURGERY Left    with pins   Patient Active Problem List   Diagnosis Date Noted   Liver lesion 06/11/2021   Severe sepsis with acute organ dysfunction ([place]) 06/09/2021   Multifocal pneumonia 06/09/2021   Acute respiratory failure with hypoxia ([place]) 06/09/2021   Lumbar radiculopathy 05/02/2021   Vertebral compression fracture ([place]) 02/12/2021   Bilateral acetabular fractures, closed, initial encounter ([place]) 02/08/2021   Closed compression fracture of L5 lumbar vertebra, initial encounter ([place]) 02/08/2021   Back pain 02/06/2021   SBO (small bowel obstruction) ([place]) 06/01/2020   Pacemaker 05/13/2020   Malignant neoplasm of prostate ([place]) 01/09/2020   Annual physical exam 01/08/2020   Acute upper respiratory infection 01/08/2020   Asymptomatic varicose veins of lower extremity 01/08/2020   Bronchitis 01/08/2020   Cataract 01/08/2020   Constipation 01/08/2020   Diverticulosis of large intestine without perforation or abscess without bleeding 01/08/2020   Screening for gout 01/08/2020   False positive serological test for syphilis 01/08/2020   Gastro-esophageal reflux disease  without esophagitis 01/08/2020   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   History of colonic polyps 01/08/2020   Impacted cerumen 01/08/2020   Insomnia 01/08/2020   Osteoarthritis 01/08/2020   Polypharmacy 01/08/2020   Pure hypercholesterolemia 01/08/2020   Rosacea 01/08/2020   Combined forms of age-related cataract of both eyes 07/13/2019   Elevated blood pressure reading 11/08/2018   Sick sinus syndrome ([place]) 02/16/2017   Persistent atrial fibrillation ([place]) 12/18/2016   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   Nonexudative age-related macular  degeneration, bilateral, 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   Dermatochalasis of both upper eyelids 07/25/2014   Nuclear sclerosis of both eyes 07/25/2014   Bradycardia 05/29/2014   Premature atrial contractions 05/29/2014   Premature junctional contractions ([place]) 05/29/2014   Hyperlipidemia 05/29/2014   Encounter for general adult medical examination with abnormal findings 04/24/2014   Vitamin D deficiency 04/24/2014    REFERRING PROVIDER: Barbie Banner, PA-C    REFERRING DIAG: [id number] (ICD-10-CM) - Collapsed vertebra, not elsewhere classified, site unspecified, initial encounter for fracture    THERAPY DIAG:  Other Low Back Pain   Muscle weakness (generalized)   Difficulty in walking, not elsewhere classified   ONSET DATE: Bilateral L5 kyphoplasty and vertebral body biopsies on 02/10/2021   SUBJECTIVE:                                                                                                                                                                                            SUBJECTIVE STATEMENT:  Pt states he felt good after prior Rx last Tuesday.  Pt reports he began having a little pain on Friday though had significant pain on Saturday.  Pt is having increased pain with ambulation and had to start using a cane.  Pt has increased his medications to try to relieve his pain.  Pt has pain and difficulty moving when standing up after sitting at work.     PERTINENT HISTORY:  02/10/2021 bilat L5 kyphoplasty,  bilat acetabular stress fractures and pubic rami stress Fx; Grade 1 anterolisthesis at L4-L5; PACEMAKER, A-fib, hx of prostate CA    PAIN:  Are you having pain? Yes NPRS scale: 8/10 current, 3/10 best, 7/10 worst  Pain location:  R SI area / lumbar    PRECAUTIONS: Back--No bending, lifting > 10# , twisting   WEIGHT BEARING RESTRICTIONS No     PATIENT GOALS to return to the gym; improve functional mobility     OBJECTIVE:    DIAGNOSTIC FINDINGS: (In Epic) -MRI  on 02/07/2021 (prior to surgery) which showed Grade 1 anterolisthesis at L4-L5 and L5 inferior endplate compression fracture with less than 50%  loss of height. -Earlier MRI showed Moderate osteoarthritis of bilateral hips and bilat acetabular stress/insufficiency fractures.  MRI indicated differential considerations include severe stress reaction possible developing insufficiency fracture versus underlying malignancy secondary to metastatic disease given the patient's history of prostate cancer. -MRI pelvis: 1. Bilateral acetabular/supra-acetabular stress fractures in the pelvis. There is likewise some stress fracture involving the left medial superior pubic ramus. This overall represents a progression compared to the previous unilateral left acetabular stress fracture shown on 11/04/2020.      TODAY'S TREATMENT    -FOTO: 37 which has worsened from prior 47.  Goal of 49 at visit #13.  -GAIT:  Pt has increased antalgic limp and decreased gait speed.  He is ambulating with a SPC.   -Reviewed pt presentation, current function, pain level, and response to prior Rx.  -See below for pt education.  Pt seen for aquatic therapy today.  Treatment took place in water 3.25-4 ft 4 in depth at the [employer] pool. Temp of water was 91.  Pt entered/exited the pool via stairs step through pattern independently with bilat rail. Pt ambulated fwd and bwd x 2 reps and sidestepped x 2 reps Kick board push pull with TrA approx 10-12 reps Seated bicycles on pool bench Supine suspension floating with aquatic vest, ankle buoys, neck float, yellow noodle under knees, and blue noodle and PT moving pt thru water to improve pain and tightness and to promote relaxation     Pt requires buoyancy for support and to offload joints with strengthening exercises. Viscosity of the water is needed for resistance of strengthening; water current perturbations provides challenge to standing balance unsupported, requiring  increased core activation.  Water will allow for reduced gait deviation due to reduced joint loading through buoyancy to help patient improve posture without excess stress and pain.        PATIENT EDUCATION:  Education details:  aquatic PT process and benefits.  PT answered questions.  POC.  exercise form/rationale.   Education method: Explanation, demonstration, verbal cues, tactile cues and appro Education comprehension: verbalized understanding and returned demonstration, verbal cues and tactile cues required     HOME EXERCISE PROGRAM: Access Code: [email or web address] URL: [email or web address]     ASSESSMENT:   CLINICAL IMPRESSION: Pt presents to Rx reporting increased R sided lumbar/SI pain and is ambulating with a SPC.  He reports no specific reason for increased pain.  He has increased antalgic limp and decreased gait speed.  Pt reports having to increase his meds due to pain.  He continues to have pain with ambulation and also when he stands up after sitting awhile at work.  Pt responded well to prior aquatic Rx having improved pain overall and having improved pain ambulating in pool.  Today, pt had increased pain with aquatic exercises and was limited with aquatic exercises due to pain.  Pt had increased pain with ambulating in the pool even when increasing depth.  Pt felt fine performing NWB'ing exercises.  He reports feeling very good having improved pain with supine suspension floating.  Pt reports improved pain from 8/10 before Rx to 6/10 after Rx.  Pt's goals have not changed since prior PN except he is progressing toward LTG #5.  Due to pt's increased pain today, he could not perform his normal aquatic program and PT did not give aquatic HEP handout.  Pt may benefit from 2-3 more sessions of aquatic therapy to establish independence with aquatic HEP.     Objective impairments include Abnormal gait,  decreased activity tolerance, decreased balance, decreased endurance, decreased  mobility, difficulty walking, decreased ROM, decreased strength, hypomobility, impaired flexibility, postural dysfunction, and pain. These impairments are limiting patient from cleaning, community activity, meal prep, occupation, laundry, shopping, and ambulation . Personal factors including 3+ comorbidities:  bilat acetabular stress fractures and pubic rami stress Fx; Pacemaker, A-fib, and hx of prostate CA   are also affecting patient's functional outcome.      REHAB POTENTIAL: Good   CLINICAL DECISION MAKING: Evolving/moderate complexity   EVALUATION COMPLEXITY: Moderate     GOALS:     SHORT TERM GOALS:   STG Name Target Date Goal status  1 Pt will be independent and compliant with HEP for improved pain, strength, and mobility.  Baseline:  03/28/2021 ACHIEVED  2 Pt will demo improved limp with gait without AD Baseline:  04/04/2021 ACHIEVED  3 Pt will progress with walking program without adverse effects.  Baseline: 04/18/2021 NOT MET  4 Pt will be able to perform bed mobility without increased pain or difficulty.  Baseline: 04/11/2021 ACHIEVED  5 Pt will perform core exercises without adverse effects for improved core strength in order to perform daily activities and functional mobility with reduced stress on lumbar, increased ease, and reduced pain Baseline: 04/04/2021 GOAL MET                      LONG TERM GOALS:    LTG Name Target Date Goal status  1 Pt will be able to perform occupational activities without significant pain or difficulty. Baseline: 08/27/2021 PARTIALLY MET  2 Pt will be able to perform extended community ambulation without an AD without increased pain or difficulty.  Baseline: 5621/2023 ONGOING  3 Pt will ambulate without an AD without limping.  Baseline: 05/16/2021 GOAL MET  4 Pt will be able to perform IADLs and household chores including his normal standing activities such as preparing food and cooking without significant pain or limitation.  Baseline:  06/26/2021  GOAL MET  5 Pt will be independent with aquatic HEP for improved strength, ambulation, and mobility.   08/27/2021 PROGRESSING      PLAN:   FREQUENCY:  1 TIME PER WEEK, 2-3 visits  DURATION:  4 WEEKS   PLANNED INTERVENTIONS: Therapeutic exercises, Therapeutic activity, Neuro Muscular re-education, Balance training, Gait training, Patient/Family education, Stair training, DME instructions, Aquatic Therapy, Cryotherapy, Taping, and Manual therapy   PLAN FOR NEXT SESSION: NO E-STIM--Pt has a PACEMAKER.   Cont with aquatic therapy.  aquatic HEP.     Selinda Michaels III PT, DPT 07/31/21 3:01 PM

## 2021-07-31 ENCOUNTER — Encounter (HOSPITAL_BASED_OUTPATIENT_CLINIC_OR_DEPARTMENT_OTHER): Payer: Self-pay | Admitting: Physical Therapy

## 2021-07-31 NOTE — Addendum Note (Signed)
Addended by: Marijo Sanes on: 07/31/2021 03:12 PM   Modules accepted: Orders

## 2021-08-05 ENCOUNTER — Other Ambulatory Visit (HOSPITAL_BASED_OUTPATIENT_CLINIC_OR_DEPARTMENT_OTHER): Payer: Self-pay

## 2021-08-05 ENCOUNTER — Encounter (HOSPITAL_BASED_OUTPATIENT_CLINIC_OR_DEPARTMENT_OTHER): Payer: Self-pay | Admitting: Physical Therapy

## 2021-08-05 ENCOUNTER — Ambulatory Visit (HOSPITAL_BASED_OUTPATIENT_CLINIC_OR_DEPARTMENT_OTHER): Payer: Medicare Other | Admitting: Physical Therapy

## 2021-08-05 DIAGNOSIS — M5459 Other low back pain: Secondary | ICD-10-CM | POA: Diagnosis not present

## 2021-08-05 DIAGNOSIS — R262 Difficulty in walking, not elsewhere classified: Secondary | ICD-10-CM

## 2021-08-05 DIAGNOSIS — M6281 Muscle weakness (generalized): Secondary | ICD-10-CM | POA: Diagnosis not present

## 2021-08-05 NOTE — Therapy (Signed)
OUTPATIENT PHYSICAL THERAPY TREATMENT NOTE        Patient Name: Todd Chapman MRN: [id number] DOB:06/18/1945, 76 y.o., male Today's Date: 08/06/2021  PCP: [name], MD    PT End of Session - 08/05/21 1532     Visit Number 23    Number of Visits 25    Date for PT Re-Evaluation 08/27/21    Authorization Type Medicare A&B    PT Start Time 1522    PT Stop Time 1602    PT Time Calculation (min) 40 min    Activity Tolerance Patient tolerated treatment well    Behavior During Therapy [facility] for tasks assessed/performed                      Past Medical History:  Diagnosis Date   Arthritis    "thumbs, back" (02/16/2017)   Bradycardia 02/2017   Dyspnea    with exertion   Flu 2 weeks ago   GERD (gastroesophageal reflux disease)    Hepatitis A as child   HLD (hyperlipidemia)    Persistent atrial fibrillation ([place]) 12/18/2016   Pneumonia 1999; 2018   Presence of permanent cardiac pacemaker    st jude   Prostate cancer [facility])    Past Surgical History:  Procedure Laterality Date   APPENDECTOMY     age 27   CARDIOVERSION  02/16/2017   CARDIOVERSION N/A 02/16/2017   Procedure: CARDIOVERSION;  Surgeon: Thompson Grayer, MD;  Location: [place] [facility];  Service: Cardiovascular;  Laterality: N/A;   COLONOSCOPY WITH PROPOFOL N/A 12/13/2012   Procedure: COLONOSCOPY WITH PROPOFOL;  Surgeon: Garlan Fair, MD;  [facility];  Service: Endoscopy;  Laterality: N/A;   FRACTURE SURGERY     HYDROCELE EXCISION Left 05/16/2018   Procedure: HYDROCELECTOMY ADULT;  Surgeon: [name], MD;  [facility];  Service: Urology;  Laterality: Left;   INSERT / REPLACE / REMOVE PACEMAKER  02/16/2017   KYPHOPLASTY N/A 02/10/2021   Procedure: KYPHOPLASTY L-5;  Surgeon: Newman Pies, MD;  Location: [place];  Service: Neurosurgery;  Laterality: N/A;   LAPAROSCOPY N/A 06/04/2020   Procedure: POSSIBLE LAPAROTOMY POSSIBLE BOWEL RESSECTION;   Surgeon: Clovis Riley, MD;  [facility]: [facility];  Service: General;  Laterality: N/A;   PACEMAKER IMPLANT N/A 02/16/2017   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 by Dr Rayann Heman   PROSTATE BIOPSY  11/23/2019   WRIST FRACTURE SURGERY Left    with pins   Patient Active Problem List   Diagnosis Date Noted   Liver lesion 06/11/2021   Severe sepsis with acute organ dysfunction ([place]) 06/09/2021   Multifocal pneumonia 06/09/2021   Acute respiratory failure with hypoxia ([place]) 06/09/2021   Lumbar radiculopathy 05/02/2021   Vertebral compression fracture ([place]) 02/12/2021   Bilateral acetabular fractures, closed, initial encounter ([place]) 02/08/2021   Closed compression fracture of L5 lumbar vertebra, initial encounter ([place]) 02/08/2021   Back pain 02/06/2021   SBO (small bowel obstruction) ([place]) 06/01/2020   Pacemaker 05/13/2020   Malignant neoplasm of prostate ([place]) 01/09/2020   Annual physical exam 01/08/2020   Acute upper respiratory infection 01/08/2020   Asymptomatic varicose veins of lower extremity 01/08/2020   Bronchitis 01/08/2020   Cataract 01/08/2020   Constipation 01/08/2020   Diverticulosis of large intestine without perforation or abscess without bleeding 01/08/2020   Screening for gout 01/08/2020   False positive serological test for syphilis 01/08/2020   Gastro-esophageal reflux disease without esophagitis  01/08/2020   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   History of colonic polyps 01/08/2020   Impacted cerumen 01/08/2020   Insomnia 01/08/2020   Osteoarthritis 01/08/2020   Polypharmacy 01/08/2020   Pure hypercholesterolemia 01/08/2020   Rosacea 01/08/2020   Combined forms of age-related cataract of both eyes 07/13/2019   Elevated blood pressure reading 11/08/2018   Sick sinus syndrome ([place]) 02/16/2017   Persistent atrial fibrillation ([place]) 12/18/2016   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   Nonexudative  age-related macular degeneration, bilateral, 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   Dermatochalasis of both upper eyelids 07/25/2014   Nuclear sclerosis of both eyes 07/25/2014   Bradycardia 05/29/2014   Premature atrial contractions 05/29/2014   Premature junctional contractions ([place]) 05/29/2014   Hyperlipidemia 05/29/2014   Encounter for general adult medical examination with abnormal findings 04/24/2014   Vitamin D deficiency 04/24/2014    REFERRING PROVIDER: Barbie Banner, PA-C    REFERRING DIAG: [phone] (ICD-10-CM) - Collapsed vertebra, not elsewhere classified, site unspecified, initial encounter for fracture    THERAPY DIAG:  Other Low Back Pain   Muscle weakness (generalized)   Difficulty in walking, not elsewhere classified   ONSET DATE: Bilateral L5 kyphoplasty and vertebral body biopsies on 02/10/2021   SUBJECTIVE:                                                                                                                                                                                            SUBJECTIVE STATEMENT:  Pt states he is feeling better today.  He has been using more medication overall to reduce his pain.  Pt has not used the cane since prior Rx. Pt reports having no increased pain after prior Rx.  Pt joined [employer] and came to the pool on Saturday.  He walked and performed some of his exercises and reports no adverse effects afterwards.  Pt has pain and difficulty when standing up after sitting at work.     PERTINENT HISTORY:  02/10/2021 bilat L5 kyphoplasty,  bilat acetabular stress fractures and pubic rami stress Fx; Grade 1 anterolisthesis at L4-L5; PACEMAKER, A-fib, hx of prostate CA    PAIN:  Are you having pain? Yes NPRS scale: 5/10 current Pain location:  R SI area / lumbar    PRECAUTIONS: Back--No bending, lifting > 10# , twisting   WEIGHT BEARING RESTRICTIONS No     PATIENT GOALS to return to the gym; improve functional mobility      OBJECTIVE:    DIAGNOSTIC FINDINGS: (In Epic) -MRI on 02/07/2021 (prior to surgery) which showed Grade 1 anterolisthesis at L4-L5 and L5 inferior endplate compression fracture with less  than 50% loss of height. -Earlier MRI showed Moderate osteoarthritis of bilateral hips and bilat acetabular stress/insufficiency fractures.  MRI indicated differential considerations include severe stress reaction possible developing insufficiency fracture versus underlying malignancy secondary to metastatic disease given the patient's history of prostate cancer. -MRI pelvis: 1. Bilateral acetabular/supra-acetabular stress fractures in the pelvis. There is likewise some stress fracture involving the left medial superior pubic ramus. This overall represents a progression compared to the previous unilateral left acetabular stress fracture shown on 11/04/2020.      TODAY'S TREATMENT     Pt seen for aquatic therapy today.  Treatment took place in water 3.25-4 ft 4 in depth at the [employer] pool. Temp of water was 91.  Pt entered/exited the pool via stairs step through pattern independently with bilat rail.  Reviewed pt presentation, current function, pain level, and response to prior Rx.  Pt ambulated fwd and bwd x 2 reps and sidestepped x 2 reps Kick board push pull with TrA 2x12-15 Shoulder extension with TrA with aquatic barbell 2x20 reps Mini squats with TrA 2x15 reps Standing HS stretch on step with UE support on bilat rails 2x20 sec bilat Plank on bench x 30 seconds  Plank on yellow noodle 2 x 20-25s hold with noodle with occasional min assist for support at noodle Pt ambulated fwd with DB's submerged x 2 laps     Pt requires buoyancy for support and to offload joints with strengthening exercises. Viscosity of the water is needed for resistance of strengthening; water current perturbations provides challenge to standing balance unsupported, requiring increased core activation.  Water will  allow for reduced gait deviation due to reduced joint loading through buoyancy to help patient improve posture without excess stress and pain.        PATIENT EDUCATION:  Education details:  aquatic PT process and benefits.  PT answered questions.  POC.  exercise form/rationale.   Education method: Explanation, demonstration, verbal cues, tactile cues and appro Education comprehension: verbalized understanding and returned demonstration, verbal cues and tactile cues required     HOME EXERCISE PROGRAM: Access Code: [email or web address] URL: [email or web address]     ASSESSMENT:   CLINICAL IMPRESSION: Pt presents to Rx feeling much better today.  He is taking more medication and is not using the cane anymore.  Pt continues to have pain with sitting at work and with ambulation.  Pt demonstrates much improved tolerance to exercises today.  He performed aquatic exercises well with cuing for correct form and positioning.  Pt did require some assistance with plank using yellow noodle, but demonstrates much improved performance compared to prior attempt.  Pt reports increased pain from 5/10 before Rx to 6.5/10 after Rx.  Pt may benefit from 1-2 more sessions of aquatic therapy to establish independence with aquatic HEP.     Objective impairments include Abnormal gait, decreased activity tolerance, decreased balance, decreased endurance, decreased mobility, difficulty walking, decreased ROM, decreased strength, hypomobility, impaired flexibility, postural dysfunction, and pain. These impairments are limiting patient from cleaning, community activity, meal prep, occupation, laundry, shopping, and ambulation . Personal factors including 3+ comorbidities:  bilat acetabular stress fractures and pubic rami stress Fx; Pacemaker, A-fib, and hx of prostate CA   are also affecting patient's functional outcome.      REHAB POTENTIAL: Good   CLINICAL DECISION MAKING: Evolving/moderate complexity    EVALUATION COMPLEXITY: Moderate     GOALS:     SHORT TERM GOALS:   STG Name Target Date Goal status  1 Pt  will be independent and compliant with HEP for improved pain, strength, and mobility.  Baseline:  03/28/2021 ACHIEVED  2 Pt will demo improved limp with gait without AD Baseline:  04/04/2021 ACHIEVED  3 Pt will progress with walking program without adverse effects.  Baseline: 04/18/2021 NOT MET  4 Pt will be able to perform bed mobility without increased pain or difficulty.  Baseline: 04/11/2021 ACHIEVED  5 Pt will perform core exercises without adverse effects for improved core strength in order to perform daily activities and functional mobility with reduced stress on lumbar, increased ease, and reduced pain Baseline: 04/04/2021 GOAL MET                      LONG TERM GOALS:    LTG Name Target Date Goal status  1 Pt will be able to perform occupational activities without significant pain or difficulty. Baseline: 08/27/2021 PARTIALLY MET  2 Pt will be able to perform extended community ambulation without an AD without increased pain or difficulty.  Baseline: 5621/2023 ONGOING  3 Pt will ambulate without an AD without limping.  Baseline: 05/16/2021 GOAL MET  4 Pt will be able to perform IADLs and household chores including his normal standing activities such as preparing food and cooking without significant pain or limitation.  Baseline: 06/26/2021  GOAL MET  5 Pt will be independent with aquatic HEP for improved strength, ambulation, and mobility.   08/27/2021 PROGRESSING      PLAN:   FREQUENCY:  1 TIME PER WEEK, 2-3 visits  DURATION:  4 WEEKS   PLANNED INTERVENTIONS: Therapeutic exercises, Therapeutic activity, Neuro Muscular re-education, Balance training, Gait training, Patient/Family education, Stair training, DME instructions, Aquatic Therapy, Cryotherapy, Taping, and Manual therapy   PLAN FOR NEXT SESSION: NO E-STIM--Pt has a PACEMAKER.   Cont with aquatic therapy.   aquatic HEP.     Selinda Michaels III PT, DPT 08/06/21 5:07 PM

## 2021-08-07 ENCOUNTER — Other Ambulatory Visit (HOSPITAL_BASED_OUTPATIENT_CLINIC_OR_DEPARTMENT_OTHER): Payer: Self-pay

## 2021-08-07 MED ORDER — SHINGRIX 50 MCG/0.5ML IM SUSR
INTRAMUSCULAR | 1 refills | Status: DC
  Filled 2021-08-07: qty 0.5, 1d supply, fill #0
  Filled 2022-01-23: qty 0.5, 1d supply, fill #1

## 2021-08-08 ENCOUNTER — Other Ambulatory Visit (HOSPITAL_BASED_OUTPATIENT_CLINIC_OR_DEPARTMENT_OTHER): Payer: Self-pay

## 2021-08-08 ENCOUNTER — Ambulatory Visit (INDEPENDENT_AMBULATORY_CARE_PROVIDER_SITE_OTHER): Payer: Medicare Other

## 2021-08-08 DIAGNOSIS — I495 Sick sinus syndrome: Secondary | ICD-10-CM | POA: Diagnosis not present

## 2021-08-10 LAB — CUP PACEART REMOTE DEVICE CHECK
Battery Remaining Longevity: 77 mo
Battery Remaining Percentage: 58 %
Battery Voltage: 3.01 V
Brady Statistic RV Percent Paced: 5.5 %
Date Time Interrogation Session: 20230602235344
Implantable Lead Implant Date: 20181211
Implantable Lead Implant Date: 20181211
Implantable Lead Location: 753859
Implantable Lead Location: 753860
Implantable Pulse Generator Implant Date: 20181211
Lead Channel Impedance Value: 350 Ohm
Lead Channel Pacing Threshold Amplitude: 0.75 V
Lead Channel Pacing Threshold Pulse Width: 0.5 ms
Lead Channel Sensing Intrinsic Amplitude: 6.6 mV
Lead Channel Setting Pacing Amplitude: 2.5 V
Lead Channel Setting Pacing Pulse Width: 0.5 ms
Lead Channel Setting Sensing Sensitivity: 2 mV
Pulse Gen Model: 2272
Pulse Gen Serial Number: 8967264

## 2021-08-12 ENCOUNTER — Ambulatory Visit (HOSPITAL_BASED_OUTPATIENT_CLINIC_OR_DEPARTMENT_OTHER): Payer: Medicare Other | Attending: Physician Assistant | Admitting: Physical Therapy

## 2021-08-12 ENCOUNTER — Encounter (HOSPITAL_BASED_OUTPATIENT_CLINIC_OR_DEPARTMENT_OTHER): Payer: Self-pay | Admitting: Physical Therapy

## 2021-08-12 DIAGNOSIS — R262 Difficulty in walking, not elsewhere classified: Secondary | ICD-10-CM | POA: Insufficient documentation

## 2021-08-12 DIAGNOSIS — M6281 Muscle weakness (generalized): Secondary | ICD-10-CM | POA: Insufficient documentation

## 2021-08-12 DIAGNOSIS — M5459 Other low back pain: Secondary | ICD-10-CM | POA: Insufficient documentation

## 2021-08-12 NOTE — Therapy (Signed)
OUTPATIENT PHYSICAL THERAPY TREATMENT NOTE        Patient Name: Todd Chapman MRN: [id number] DOB:1945-06-16, 76 y.o., male Today's Date: 08/12/2021  PCP: [name], MD    PT End of Session - 08/12/21 1037     Visit Number 24    Number of Visits 25    Date for PT Re-Evaluation 08/27/21    Authorization Type Medicare A&B    PT Start Time 1045   pt late   PT Stop Time 1115    PT Time Calculation (min) 30 min    Activity Tolerance Patient tolerated treatment well    Behavior During Therapy WFL for tasks assessed/performed                      Past Medical History:  Diagnosis Date   Arthritis    "thumbs, back" (02/16/2017)   Bradycardia 02/2017   Dyspnea    with exertion   Flu 2 weeks ago   GERD (gastroesophageal reflux disease)    Hepatitis A as child   HLD (hyperlipidemia)    Persistent atrial fibrillation ([place]) 12/18/2016   Pneumonia 1999; 2018   Presence of permanent cardiac pacemaker    st jude   Prostate cancer [facility])    Past Surgical History:  Procedure Laterality Date   APPENDECTOMY     age 81   CARDIOVERSION  02/16/2017   CARDIOVERSION N/A 02/16/2017   Procedure: CARDIOVERSION;  Surgeon: Thompson Grayer, MD;  Location: [place] [facility];  Service: Cardiovascular;  Laterality: N/A;   COLONOSCOPY WITH PROPOFOL N/A 12/13/2012   Procedure: COLONOSCOPY WITH PROPOFOL;  Surgeon: Garlan Fair, MD;  [facility];  Service: Endoscopy;  Laterality: N/A;   FRACTURE SURGERY     HYDROCELE EXCISION Left 05/16/2018   Procedure: HYDROCELECTOMY ADULT;  Surgeon: [name], MD;  Location: [facility];  Service: Urology;  Laterality: Left;   INSERT / REPLACE / REMOVE PACEMAKER  02/16/2017   KYPHOPLASTY N/A 02/10/2021   Procedure: KYPHOPLASTY L-5;  Surgeon: Newman Pies, MD;  Location: [place];  Service: Neurosurgery;  Laterality: N/A;   LAPAROSCOPY N/A 06/04/2020   Procedure: POSSIBLE LAPAROTOMY POSSIBLE BOWEL RESSECTION;   Surgeon: Clovis Riley, MD;  [facility];  Service: General;  Laterality: N/A;   PACEMAKER IMPLANT N/A 02/16/2017   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 by Dr Rayann Heman   PROSTATE BIOPSY  11/23/2019   WRIST FRACTURE SURGERY Left    with pins   Patient Active Problem List   Diagnosis Date Noted   Liver lesion 06/11/2021   Severe sepsis with acute organ dysfunction ([place]) 06/09/2021   Multifocal pneumonia 06/09/2021   Acute respiratory failure with hypoxia ([place]) 06/09/2021   Lumbar radiculopathy 05/02/2021   Vertebral compression fracture ([place]) 02/12/2021   Bilateral acetabular fractures, closed, initial encounter ([place]) 02/08/2021   Closed compression fracture of L5 lumbar vertebra, initial encounter ([place]) 02/08/2021   Back pain 02/06/2021   SBO (small bowel obstruction) ([place]) 06/01/2020   Pacemaker 05/13/2020   Malignant neoplasm of prostate ([place]) 01/09/2020   Annual physical exam 01/08/2020   Acute upper respiratory infection 01/08/2020   Asymptomatic varicose veins of lower extremity 01/08/2020   Bronchitis 01/08/2020   Cataract 01/08/2020   Constipation 01/08/2020   Diverticulosis of large intestine without perforation or abscess without bleeding 01/08/2020   Screening for gout 01/08/2020   False positive serological test for syphilis 01/08/2020   Gastro-esophageal reflux  disease without esophagitis 01/08/2020   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   History of colonic polyps 01/08/2020   Impacted cerumen 01/08/2020   Insomnia 01/08/2020   Osteoarthritis 01/08/2020   Polypharmacy 01/08/2020   Pure hypercholesterolemia 01/08/2020   Rosacea 01/08/2020   Combined forms of age-related cataract of both eyes 07/13/2019   Elevated blood pressure reading 11/08/2018   Sick sinus syndrome ([place]) 02/16/2017   Persistent atrial fibrillation ([place]) 12/18/2016   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   Nonexudative  age-related macular degeneration, bilateral, 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   Dermatochalasis of both upper eyelids 07/25/2014   Nuclear sclerosis of both eyes 07/25/2014   Bradycardia 05/29/2014   Premature atrial contractions 05/29/2014   Premature junctional contractions ([place]) 05/29/2014   Hyperlipidemia 05/29/2014   Encounter for general adult medical examination with abnormal findings 04/24/2014   Vitamin D deficiency 04/24/2014    REFERRING PROVIDER: Barbie Banner, PA-C    REFERRING DIAG: [phone] (ICD-10-CM) - Collapsed vertebra, not elsewhere classified, site unspecified, initial encounter for fracture    THERAPY DIAG:  Other Low Back Pain   Muscle weakness (generalized)   Difficulty in walking, not elsewhere classified   ONSET DATE: Bilateral L5 kyphoplasty and vertebral body biopsies on 02/10/2021   SUBJECTIVE:                                                                                                                                                                                            SUBJECTIVE STATEMENT:  "I have walked 30 laps twice (lap pool) since last visit, pain in right side constant, no changes"    PERTINENT HISTORY:  02/10/2021 bilat L5 kyphoplasty,  bilat acetabular stress fractures and pubic rami stress Fx; Grade 1 anterolisthesis at L4-L5; PACEMAKER, A-fib, hx of prostate CA    PAIN:  Are you having pain? Yes NPRS scale: 4/10 current Pain location:  R SI area / lumbar    PRECAUTIONS: Back--No bending, lifting > 10# , twisting   WEIGHT BEARING RESTRICTIONS No     PATIENT GOALS to return to the gym; improve functional mobility     OBJECTIVE:    DIAGNOSTIC FINDINGS: (In Epic) -MRI on 02/07/2021 (prior to surgery) which showed Grade 1 anterolisthesis at L4-L5 and L5 inferior endplate compression fracture with less than 50% loss of height. -Earlier MRI showed Moderate osteoarthritis of bilateral hips and bilat acetabular stress/insufficiency  fractures.  MRI indicated differential considerations include severe stress reaction possible developing insufficiency fracture versus underlying malignancy secondary to metastatic disease given the patient's history of prostate cancer. -MRI pelvis: 1. Bilateral acetabular/supra-acetabular stress fractures in the pelvis. There is  likewise some stress fracture involving the left medial superior pubic ramus. This overall represents a progression compared to the previous unilateral left acetabular stress fracture shown on 11/04/2020.      TODAY'S TREATMENT     Pt seen for aquatic therapy today.  Treatment took place in water 3.25-4 ft 4 in depth at the [employer] pool. Temp of water was 91.  Pt entered/exited the pool via stairs step through pattern independently with bilat rail.  Reviewed pt presentation, current function, pain level, and response to prior Rx.  Pt ambulated fwd and bwd x 2 reps and sidestepped x 2 reps LB stretching: paraspinals and rotators Straddling noodle: cycling, add/abd and skiiing. Alternating 20 fast revolutions with 20 slow 4 trials ea for aerobic capacity and endurance training. Plank on yellow noodle 3 x 20-25s hold with noodle with occasional min assist for support at noodle and cues for execution UE pull down in plank 4x5 Pt ambulated fwd with DB's submerged x 2 laps     Pt requires buoyancy for support and to offload joints with strengthening exercises. Viscosity of the water is needed for resistance of strengthening; water current perturbations provides challenge to standing balance unsupported, requiring increased core activation.  Water will allow for reduced gait deviation due to reduced joint loading through buoyancy to help patient improve posture without excess stress and pain.        PATIENT EDUCATION:  Education details:  aquatic PT process and benefits.  PT answered questions.  POC.  exercise form/rationale.   Education method:  Explanation, demonstration, verbal cues, tactile cues and appro Education comprehension: verbalized understanding and returned demonstration, verbal cues and tactile cues required     HOME EXERCISE PROGRAM: Access Code: [email or web address] URL: [email or web address]     ASSESSMENT:   CLINICAL IMPRESSION: Progressed core strengthening with water pilates techniques.  He executes well with minor assist (from outside the pool), demonstration and moderate verbal cuing. No pain with technique.  Also introduced aerobic capacity challenge of straddling noodle cycling. Pt able to gain sitting balance with yellow hand buoy support. Both activities tolerated well with desired effect. Goals ongoing.   Objective impairments include Abnormal gait, decreased activity tolerance, decreased balance, decreased endurance, decreased mobility, difficulty walking, decreased ROM, decreased strength, hypomobility, impaired flexibility, postural dysfunction, and pain. These impairments are limiting patient from cleaning, community activity, meal prep, occupation, laundry, shopping, and ambulation . Personal factors including 3+ comorbidities:  bilat acetabular stress fractures and pubic rami stress Fx; Pacemaker, A-fib, and hx of prostate CA   are also affecting patient's functional outcome.      REHAB POTENTIAL: Good   CLINICAL DECISION MAKING: Evolving/moderate complexity   EVALUATION COMPLEXITY: Moderate     GOALS:     SHORT TERM GOALS:   STG Name Target Date Goal status  1 Pt will be independent and compliant with HEP for improved pain, strength, and mobility.  Baseline:  03/28/2021 ACHIEVED  2 Pt will demo improved limp with gait without AD Baseline:  04/04/2021 ACHIEVED  3 Pt will progress with walking program without adverse effects.  Baseline: 04/18/2021 NOT MET  4 Pt will be able to perform bed mobility without increased pain or difficulty.  Baseline: 04/11/2021 ACHIEVED  5 Pt will perform core  exercises without adverse effects for improved core strength in order to perform daily activities and functional mobility with reduced stress on lumbar, increased ease, and reduced pain Baseline: 04/04/2021 GOAL MET  LONG TERM GOALS:    LTG Name Target Date Goal status  1 Pt will be able to perform occupational activities without significant pain or difficulty. Baseline: 08/27/2021 PARTIALLY MET  2 Pt will be able to perform extended community ambulation without an AD without increased pain or difficulty.  Baseline: 5621/2023 ONGOING  3 Pt will ambulate without an AD without limping.  Baseline: 05/16/2021 GOAL MET  4 Pt will be able to perform IADLs and household chores including his normal standing activities such as preparing food and cooking without significant pain or limitation.  Baseline: 06/26/2021  GOAL MET  5 Pt will be independent with aquatic HEP for improved strength, ambulation, and mobility.   08/27/2021 PROGRESSING      PLAN:   FREQUENCY:  1 TIME PER WEEK, 2-3 visits  DURATION:  4 WEEKS   PLANNED INTERVENTIONS: Therapeutic exercises, Therapeutic activity, Neuro Muscular re-education, Balance training, Gait training, Patient/Family education, Stair training, DME instructions, Aquatic Therapy, Cryotherapy, Taping, and Manual therapy   PLAN FOR NEXT SESSION: NO E-STIM--Pt has a PACEMAKER.   Cont with aquatic therapy.  aquatic HEP.     Preesha Benjamin Tharon Aquas) Sherhonda Gaspar MPT 08/12/21 1:30 PM

## 2021-08-13 ENCOUNTER — Ambulatory Visit: Payer: Medicare Other | Admitting: Physical Medicine and Rehabilitation

## 2021-08-14 NOTE — Progress Notes (Signed)
Remote pacemaker transmission.   

## 2021-08-21 ENCOUNTER — Telehealth: Payer: Self-pay | Admitting: *Deleted

## 2021-08-21 MED ORDER — HYDROCODONE-ACETAMINOPHEN 5-325 MG PO TABS
1.0000 | ORAL_TABLET | Freq: Four times a day (QID) | ORAL | 0 refills | Status: DC | PRN
Start: 2021-08-21 — End: 2021-08-27

## 2021-08-21 NOTE — Telephone Encounter (Signed)
Notified of refill. 

## 2021-08-21 NOTE — Telephone Encounter (Signed)
Todd Chapman called for his refill on his hydrocodone 5-325 #[phone]. Last fill date was 07/18/21 per PMP.

## 2021-08-25 ENCOUNTER — Ambulatory Visit (HOSPITAL_BASED_OUTPATIENT_CLINIC_OR_DEPARTMENT_OTHER): Payer: Medicare Other | Admitting: Physical Therapy

## 2021-08-25 DIAGNOSIS — R262 Difficulty in walking, not elsewhere classified: Secondary | ICD-10-CM

## 2021-08-25 DIAGNOSIS — M5459 Other low back pain: Secondary | ICD-10-CM

## 2021-08-25 DIAGNOSIS — M6281 Muscle weakness (generalized): Secondary | ICD-10-CM | POA: Diagnosis not present

## 2021-08-25 NOTE — Therapy (Signed)
OUTPATIENT PHYSICAL THERAPY TREATMENT NOTE        Patient Name: Todd Chapman MRN: [id number] DOB:Sep 14, 1945, 76 y.o., male Today's Date: 08/25/2021  PCP: [name], MD               Past Medical History:  Diagnosis Date   Arthritis    "thumbs, back" (02/16/2017)   Bradycardia 02/2017   Dyspnea    with exertion   Flu 2 weeks ago   GERD (gastroesophageal reflux disease)    Hepatitis A as child   HLD (hyperlipidemia)    Persistent atrial fibrillation ([place]) 12/18/2016   Pneumonia 1999; 2018   Presence of permanent cardiac pacemaker    st jude   Prostate cancer [facility])    Past Surgical History:  Procedure Laterality Date   APPENDECTOMY     age 77   CARDIOVERSION  02/16/2017   CARDIOVERSION N/A 02/16/2017   Procedure: CARDIOVERSION;  Surgeon: Thompson Grayer, MD;  Location: [facility];  Service: Cardiovascular;  Laterality: N/A;   COLONOSCOPY WITH PROPOFOL N/A 12/13/2012   Procedure: COLONOSCOPY WITH PROPOFOL;  Surgeon: Garlan Fair, MD;  [facility];  Service: Endoscopy;  Laterality: N/A;   FRACTURE SURGERY     HYDROCELE EXCISION Left 05/16/2018   Procedure: HYDROCELECTOMY ADULT;  Surgeon: [name], MD;  Location: [facility];  Service: Urology;  Laterality: Left;   INSERT / REPLACE / REMOVE PACEMAKER  02/16/2017   KYPHOPLASTY N/A 02/10/2021   Procedure: KYPHOPLASTY L-5;  Surgeon: Newman Pies, MD;  Location: [facility];  Service: Neurosurgery;  Laterality: N/A;   LAPAROSCOPY N/A 06/04/2020   Procedure: POSSIBLE LAPAROTOMY POSSIBLE BOWEL RESSECTION;  Surgeon: Clovis Riley, MD;  [facility];  Service: General;  Laterality: N/A;   PACEMAKER IMPLANT N/A 02/16/2017   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 by Dr Rayann Heman   PROSTATE BIOPSY  11/23/2019   WRIST FRACTURE SURGERY Left    with pins   Patient Active Problem List   Diagnosis Date Noted    Liver lesion 06/11/2021   Severe sepsis with acute organ dysfunction ([place]) 06/09/2021   Multifocal pneumonia 06/09/2021   Acute respiratory failure with hypoxia ([place]) 06/09/2021   Lumbar radiculopathy 05/02/2021   Vertebral compression fracture ([place]) 02/12/2021   Bilateral acetabular fractures, closed, initial encounter ([place]) 02/08/2021   Closed compression fracture of L5 lumbar vertebra, initial encounter ([place]) 02/08/2021   Back pain 02/06/2021   SBO (small bowel obstruction) ([place]) 06/01/2020   Pacemaker 05/13/2020   Malignant neoplasm of prostate ([place]) 01/09/2020   Annual physical exam 01/08/2020   Acute upper respiratory infection 01/08/2020   Asymptomatic varicose veins of lower extremity 01/08/2020   Bronchitis 01/08/2020   Cataract 01/08/2020   Constipation 01/08/2020   Diverticulosis of large intestine without perforation or abscess without bleeding 01/08/2020   Screening for gout 01/08/2020   False positive serological test for syphilis 01/08/2020   Gastro-esophageal reflux disease without esophagitis 01/08/2020   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   History of colonic polyps 01/08/2020   Impacted cerumen 01/08/2020   Insomnia 01/08/2020   Osteoarthritis 01/08/2020   Polypharmacy 01/08/2020   Pure hypercholesterolemia 01/08/2020   Rosacea 01/08/2020   Combined forms of age-related cataract of both eyes 07/13/2019   Elevated blood pressure reading 11/08/2018   Sick sinus syndrome ([place]) 02/16/2017   Persistent atrial fibrillation ([place]) 12/18/2016   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   Nonexudative age-related macular degeneration, bilateral, early  dry stage 10/07/2015   Dermatochalasis of both upper eyelids 07/25/2014   Nuclear sclerosis of both eyes 07/25/2014   Bradycardia 05/29/2014   Premature atrial contractions 05/29/2014   Premature junctional contractions ([place]) 05/29/2014   Hyperlipidemia 05/29/2014   Encounter for general adult medical examination with  abnormal findings 04/24/2014   Vitamin D deficiency 04/24/2014    REFERRING PROVIDER: Barbie Banner, PA-C    REFERRING DIAG: [phone] (ICD-10-CM) - Collapsed vertebra, not elsewhere classified, site unspecified, initial encounter for fracture    THERAPY DIAG:  Other Low Back Pain   Muscle weakness (generalized)   Difficulty in walking, not elsewhere classified   ONSET DATE: Bilateral L5 kyphoplasty and vertebral body biopsies on 02/10/2021   SUBJECTIVE:                                                                                                                                                                                            SUBJECTIVE STATEMENT:  Pt reports he felt fine after prior Rx.  Pt reports he has been performing aquatic exercises without adverse effects.  He states he has soreness after the aquatic planks though no pain.  Pt reports he is able to perform the bicycles in the pool with the noodle and hand buoys.  Pt was on his feet for 10 hours at work including walking between rooms.  Pt was very tired but was able to perform.  Pt states that was "real progress".  He continues to have difficulty and pain with ambulation though states he is improving.  He continues to have pain with sitting at work.   PERTINENT HISTORY:  02/10/2021 bilat L5 kyphoplasty,  bilat acetabular stress fractures and pubic rami stress Fx; Grade 1 anterolisthesis at L4-L5; PACEMAKER, A-fib, hx of prostate CA    PAIN:  Are you having pain? Yes NPRS scale: 5/10 current, 7/10 worst, 4/10 best Pain location:  R SI area / lumbar    PRECAUTIONS: Back--No bending, lifting > 10# , twisting   WEIGHT BEARING RESTRICTIONS No     PATIENT GOALS to return to the gym; improve functional mobility     OBJECTIVE:    DIAGNOSTIC FINDINGS: (In Epic) -MRI on 02/07/2021 (prior to surgery) which showed Grade 1 anterolisthesis at L4-L5 and L5 inferior endplate compression fracture with less than 50% loss of  height. -Earlier MRI showed Moderate osteoarthritis of bilateral hips and bilat acetabular stress/insufficiency fractures.  MRI indicated differential considerations include severe stress reaction possible developing insufficiency fracture versus underlying malignancy secondary to metastatic disease given the patient's history of prostate cancer. -MRI pelvis: 1. Bilateral acetabular/supra-acetabular stress fractures in the  pelvis. There is likewise some stress fracture involving the left medial superior pubic ramus. This overall represents a progression compared to the previous unilateral left acetabular stress fracture shown on 11/04/2020.      TODAY'S TREATMENT    -Reviewed current function, pain levels, and response to prior Rx. -PT gave pt an aquatic HEP handout and went thru program with pt.  PT educated pt concerning aquatic exercises, exercises form, and appropriate progression.         PATIENT EDUCATION:  Education details:  aquatic HEP and was given a handout.  PT answered questions.  POC.  exercise form/rationale.   Education method: Explanation, demonstration Education comprehension: verbalized understanding     HOME EXERCISE PROGRAM: Access Code: [email or web address] URL: [email or web address]     ASSESSMENT:   CLINICAL IMPRESSION: Unable to perform aquatic therapy due to weather/thunder.    Progressed core strengthening with water pilates techniques.  He executes well with minor assist (from outside the pool), demonstration and moderate verbal cuing. No pain with technique.  Also introduced aerobic capacity challenge of straddling noodle cycling. Pt able to gain sitting balance with yellow hand buoy support. Both activities tolerated well with desired effect. Goals ongoing.   Objective impairments include Abnormal gait, decreased activity tolerance, decreased balance, decreased endurance, decreased mobility, difficulty walking, decreased ROM, decreased strength,  hypomobility, impaired flexibility, postural dysfunction, and pain. These impairments are limiting patient from cleaning, community activity, meal prep, occupation, laundry, shopping, and ambulation . Personal factors including 3+ comorbidities:  bilat acetabular stress fractures and pubic rami stress Fx; Pacemaker, A-fib, and hx of prostate CA   are also affecting patient's functional outcome.      REHAB POTENTIAL: Good   CLINICAL DECISION MAKING: Evolving/moderate complexity   EVALUATION COMPLEXITY: Moderate     GOALS:     SHORT TERM GOALS:   STG Name Target Date Goal status  1 Pt will be independent and compliant with HEP for improved pain, strength, and mobility.  Baseline:  03/28/2021 ACHIEVED  2 Pt will demo improved limp with gait without AD Baseline:  04/04/2021 ACHIEVED  3 Pt will progress with walking program without adverse effects.  Baseline: 04/18/2021 NOT MET  4 Pt will be able to perform bed mobility without increased pain or difficulty.  Baseline: 04/11/2021 ACHIEVED  5 Pt will perform core exercises without adverse effects for improved core strength in order to perform daily activities and functional mobility with reduced stress on lumbar, increased ease, and reduced pain Baseline: 04/04/2021 GOAL MET                      LONG TERM GOALS:    LTG Name Target Date Goal status  1 Pt will be able to perform occupational activities without significant pain or difficulty. Baseline: 08/27/2021 PARTIALLY MET  2 Pt will be able to perform extended community ambulation without an AD without increased pain or difficulty.  Baseline: 5621/2023 NOT MET  3 Pt will ambulate without an AD without limping.  Baseline: 05/16/2021 GOAL MET  4 Pt will be able to perform IADLs and household chores including his normal standing activities such as preparing food and cooking without significant pain or limitation.  Baseline: 06/26/2021  GOAL MET  5 Pt will be independent with aquatic HEP for  improved strength, ambulation, and mobility.   08/27/2021 GOAL MET      PLAN:   FREQUENCY:  1 TIME PER WEEK, 2-3 visits  DURATION:  4 WEEKS   PLANNED  INTERVENTIONS: Therapeutic exercises, Therapeutic activity, Neuro Muscular re-education, Balance training, Gait training, Patient/Family education, Stair training, DME instructions, Aquatic Therapy, Cryotherapy, Taping, and Manual therapy   PLAN FOR NEXT SESSION: NO E-STIM--Pt has a PACEMAKER.   Cont with aquatic therapy.  aquatic HEP.     [name] Tharon Aquas) [place] MPT 08/25/21 1:06 PM

## 2021-08-26 ENCOUNTER — Ambulatory Visit
Admission: RE | Admit: 2021-08-26 | Discharge: 2021-08-26 | Disposition: A | Discharge status: Home / Self Care | Payer: Medicare Other | Source: Ambulatory Visit | Attending: Internal Medicine | Admitting: Internal Medicine

## 2021-08-26 ENCOUNTER — Encounter (HOSPITAL_BASED_OUTPATIENT_CLINIC_OR_DEPARTMENT_OTHER): Payer: Self-pay | Admitting: Physical Therapy

## 2021-08-26 DIAGNOSIS — E559 Vitamin D deficiency, unspecified: Secondary | ICD-10-CM

## 2021-08-26 DIAGNOSIS — M810 Age-related osteoporosis without current pathological fracture: Secondary | ICD-10-CM | POA: Diagnosis not present

## 2021-08-27 ENCOUNTER — Encounter
Payer: Medicare Other | Attending: Physical Medicine and Rehabilitation | Admitting: Physical Medicine and Rehabilitation

## 2021-08-27 ENCOUNTER — Encounter: Payer: Self-pay | Admitting: Physical Medicine and Rehabilitation

## 2021-08-27 VITALS — BP 138/82 | HR 58 | Ht 74.0 in | Wt 212.0 lb

## 2021-08-27 DIAGNOSIS — Z79891 Long term (current) use of opiate analgesic: Secondary | ICD-10-CM | POA: Diagnosis not present

## 2021-08-27 DIAGNOSIS — Z5181 Encounter for therapeutic drug level monitoring: Secondary | ICD-10-CM | POA: Insufficient documentation

## 2021-08-27 DIAGNOSIS — G894 Chronic pain syndrome: Secondary | ICD-10-CM | POA: Insufficient documentation

## 2021-08-27 MED ORDER — HYDROCODONE-ACETAMINOPHEN 5-325 MG PO TABS: 1.0000 | ORAL_TABLET | Freq: Four times a day (QID) | ORAL | 0 refills | Status: DC | PRN

## 2021-08-27 NOTE — Progress Notes (Signed)
Subjective:    Patient ID: Todd Chapman, male    DOB: November 08, 1945, 76 y.o.   MRN: [id number]  HPI  Pt is a 76 yr old male with hx of chronic back pain with L5 vertebral body compression fx s/p kyphoplasty, hx of prostate CA- no mets; cancer related fatigue, HTN; hyponatremia in hospital;  Here for hospital f/u on kyphoplasty.  Still getting hormonal therapy and having night sweats. Here for f/u on chronic pain.   Is improving Graduated from PT this week-  Walking with no assistive device.  Carries cane in car, just in case.   Learning to manage pain more In AM- takes 1/2 Norco- and a tylenol.  And usual meds after breakfast- gets him through During day a tylenol. And in evening, sometimes takes 1 Norco and occ 1 later in evening.  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    Wife took gabapentin. Was asking about this.   10 hour mediation last week- and can sometimes have one that lasts 12-15 hours at a time.    Was doing aqua therapy with gym membership- trying to go gym machines and aqua therapy 3-4x/week.  Walking causes the most pain.  Likes the nu-step the best- to take weight off back the best.   Had bone scan yesterday- waiting for results.   Pain Inventory Average Pain 6 Pain Right Now 4 My pain is dull and aching  In the last 24 hours, has pain interfered with the following? General activity 7 Relation with others 7 Enjoyment of life 7 What TIME of day is your pain at its worst? evening and night Sleep (in general) Fair  Pain is worse with: walking, bending, sitting, inactivity, and standing Pain improves with: therapy/exercise and medication Relief from Meds: 9  Family History  Problem Relation Age of Onset   CVA Mother    Coronary artery disease Mother    Stroke Mother    Coronary artery disease Father    Heart attack Father    Stomach cancer Maternal Grandmother    Breast cancer Neg Hx    Prostate  cancer Neg Hx    Colon cancer Neg Hx    Pancreatic cancer Neg Hx    Social History   Socioeconomic History   Marital status: Married    Spouse name: Not on file   Number of children: 2   Years of education: Not on file   Highest education level: Not on file  Occupational History   Not on file  Tobacco Use   Smoking status: Former    Packs/day: 0.12    Years: 8.00    Total pack years: 0.96    Types: Cigarettes    Quit date: 11/24/1965    Years since quitting: 55.7   Smokeless tobacco: Never  Vaping Use   Vaping Use: Never used  Substance and Sexual Activity   Alcohol use: Yes    Comment: 02/16/2017 "might have 1 drink/month"   Drug use: No   Sexual activity: Yes  Other Topics Concern   Not on file  Social History Narrative   He is a judge   Social Determinants of Health   Financial Resource Strain: Not on file  Food Insecurity: Not on file  Transportation Needs: Not on file  Physical Activity: Not on file  Stress: Not on file  Social Connections: Not on file   Past Surgical History:  Procedure Laterality Date  APPENDECTOMY     age 30   CARDIOVERSION  02/16/2017   CARDIOVERSION N/A 02/16/2017   Procedure: CARDIOVERSION;  Surgeon: Thompson Grayer, MD;  Location: [facility];  Service: Cardiovascular;  Laterality: N/A;   COLONOSCOPY WITH PROPOFOL N/A 12/13/2012   Procedure: COLONOSCOPY WITH PROPOFOL;  Surgeon: Garlan Fair, MD;  [facility];  Service: Endoscopy;  Laterality: N/A;   FRACTURE SURGERY     HYDROCELE EXCISION Left 05/16/2018   Procedure: HYDROCELECTOMY ADULT;  Surgeon: [name], MD;  [facility];  Service: Urology;  Laterality: Left;   INSERT / REPLACE / REMOVE PACEMAKER  02/16/2017   KYPHOPLASTY N/A 02/10/2021   Procedure: KYPHOPLASTY L-5;  Surgeon: Newman Pies, MD;  Location: [facility];  Service: Neurosurgery;  Laterality: N/A;   LAPAROSCOPY N/A 06/04/2020   Procedure: POSSIBLE LAPAROTOMY POSSIBLE  BOWEL RESSECTION;  Surgeon: Clovis Riley, MD;  [facility];  Service: General;  Laterality: N/A;   PACEMAKER IMPLANT N/A 02/16/2017   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 by Dr Rayann Heman   PROSTATE BIOPSY  11/23/2019   WRIST FRACTURE SURGERY Left    with pins   Past Surgical History:  Procedure Laterality Date   APPENDECTOMY     age 57   CARDIOVERSION  02/16/2017   CARDIOVERSION N/A 02/16/2017   Procedure: CARDIOVERSION;  Surgeon: Thompson Grayer, MD;  Location: [facility];  Service: Cardiovascular;  Laterality: N/A;   COLONOSCOPY WITH PROPOFOL N/A 12/13/2012   Procedure: COLONOSCOPY WITH PROPOFOL;  Surgeon: Garlan Fair, MD;  [facility];  Service: Endoscopy;  Laterality: N/A;   FRACTURE SURGERY     HYDROCELE EXCISION Left 05/16/2018   Procedure: HYDROCELECTOMY ADULT;  Surgeon: [name], MD;  Location: [facility];  Service: Urology;  Laterality: Left;   INSERT / REPLACE / REMOVE PACEMAKER  02/16/2017   KYPHOPLASTY N/A 02/10/2021   Procedure: KYPHOPLASTY L-5;  Surgeon: Newman Pies, MD;  Location: [facility];  Service: Neurosurgery;  Laterality: N/A;   LAPAROSCOPY N/A 06/04/2020   Procedure: POSSIBLE LAPAROTOMY POSSIBLE BOWEL RESSECTION;  Surgeon: Clovis Riley, MD;  [facility];  Service: General;  Laterality: N/A;   PACEMAKER IMPLANT N/A 02/16/2017   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 by Dr Rayann Heman   PROSTATE BIOPSY  11/23/2019   WRIST FRACTURE SURGERY Left    with pins   Past Medical History:  Diagnosis Date   Arthritis    "thumbs, back" (02/16/2017)   Bradycardia 02/2017   Dyspnea    with exertion   Flu 2 weeks ago   GERD (gastroesophageal reflux disease)    Hepatitis A as child   HLD (hyperlipidemia)    Persistent atrial fibrillation ([place]) 12/18/2016   Pneumonia 1999; 2018   Presence of  permanent cardiac pacemaker    st jude   Prostate cancer ([facility])    BP 138/82   Pulse (!) 58   Ht '6\' 2"'$  (1.88 m)   Wt 212 lb (96.2 kg)   SpO2 98%   BMI 27.22 kg/m   Opioid Risk Score:   Fall Risk Score:  `1  Depression screen [facility] 2/9     05/02/2021    2:41 PM  Depression screen PHQ 2/9  Decreased Interest 0  Down, Depressed, Hopeless 0  PHQ - 2 Score 0  Altered sleeping 3  Tired, decreased energy 2  Change in appetite 0  Feeling bad or failure about yourself  0  Trouble concentrating 0  Moving slowly or fidgety/restless 0  Suicidal thoughts 0  PHQ-9 Score 5  Difficult doing work/chores Somewhat difficult      Review of Systems  Musculoskeletal:  Positive for back pain.  All other systems reviewed and are negative.     Objective:   Physical Exam  Awake, alert, appropriate, NAD Sitting on table       Assessment & Plan:   Pt is a 76 yr old male with hx of chronic back pain with L5 vertebral body compression fx s/p kyphoplasty, hx of prostate CA- no mets; cancer related fatigue, HTN; hyponatremia in hospital;  Here for hospital f/u on kyphoplasty.  Still getting hormonal therapy and having night sweats.   1. Pain is due to Degenerative disc disease/Osteoarthritis.   Will continue Hydrocodone 5/325 mg #120 tabs- will need to get filled 09/15/21. Sent in refill early so doesn't have to call here next time.    2.  Pcp will retire soon, but has another PCP to cover Dr Koleen Nimrod.   3. Need to do something 5 days/week. For back pain.   4. Focus on Nu-step - not hiking/walking.   5. DS today and opiate contract- done today  6. F/U in 102month  I spent a total of  234  minutes on total care today- >50% coordination of care- due to discussion about pain meds and opiate contract requirements.

## 2021-08-27 NOTE — Patient Instructions (Signed)
Pt is a 76 yr old male with hx of chronic back pain with L5 vertebral body compression fx s/p kyphoplasty, hx of prostate CA- no mets; cancer related fatigue, HTN; hyponatremia in hospital;  Here for hospital f/u on kyphoplasty.  Still getting hormonal therapy and having night sweats.   1. Pain is due to Degenerative disc disease/Osteoarthritis.   Will continue Hydrocodone 5/325 mg #120 tabs- will need to get filled 09/15/21. Sent in refill early so doesn't have to call here next time.    2.  Pcp will retire soon, but has another PCP to cover Dr Koleen Nimrod.   3. Need to do something 5 days/week. For back pain.   4. Focus on Nu-step - not hiking/walking.   5. DS today and opiate contract- done today  6. F/U in 53month

## 2021-09-03 DIAGNOSIS — L723 Sebaceous cyst: Secondary | ICD-10-CM | POA: Diagnosis not present

## 2021-09-03 DIAGNOSIS — D225 Melanocytic nevi of trunk: Secondary | ICD-10-CM | POA: Diagnosis not present

## 2021-09-03 DIAGNOSIS — L570 Actinic keratosis: Secondary | ICD-10-CM | POA: Diagnosis not present

## 2021-09-03 DIAGNOSIS — L821 Other seborrheic keratosis: Secondary | ICD-10-CM | POA: Diagnosis not present

## 2021-09-03 DIAGNOSIS — D2371 Other benign neoplasm of skin of right lower limb, including hip: Secondary | ICD-10-CM | POA: Diagnosis not present

## 2021-09-03 LAB — TOXASSURE SELECT,+ANTIDEPR,UR

## 2021-09-04 ENCOUNTER — Telehealth: Payer: Self-pay | Admitting: *Deleted

## 2021-09-04 NOTE — Telephone Encounter (Signed)
Urine drug screen for this encounter is consistent for prescribed medication. However it is also positive for alcohol as well.  A warning letter will be sent through [place] on consuming alcohol while taking narcotic medication.

## 2021-09-05 NOTE — Telephone Encounter (Signed)
Thank you- ML

## 2021-09-11 DIAGNOSIS — Z961 Presence of intraocular lens: Secondary | ICD-10-CM | POA: Diagnosis not present

## 2021-09-11 DIAGNOSIS — H353132 Nonexudative age-related macular degeneration, bilateral, intermediate dry stage: Secondary | ICD-10-CM | POA: Diagnosis not present

## 2021-09-11 DIAGNOSIS — D3132 Benign neoplasm of left choroid: Secondary | ICD-10-CM | POA: Diagnosis not present

## 2021-09-17 ENCOUNTER — Ambulatory Visit (HOSPITAL_COMMUNITY): Payer: Medicare Other

## 2021-09-19 ENCOUNTER — Other Ambulatory Visit (HOSPITAL_COMMUNITY): Payer: Medicare Other

## 2021-09-19 ENCOUNTER — Encounter (HOSPITAL_COMMUNITY): Payer: Self-pay

## 2021-09-25 DIAGNOSIS — M545 Low back pain, unspecified: Secondary | ICD-10-CM | POA: Diagnosis not present

## 2021-09-25 DIAGNOSIS — R030 Elevated blood-pressure reading, without diagnosis of hypertension: Secondary | ICD-10-CM | POA: Diagnosis not present

## 2021-09-25 DIAGNOSIS — G8929 Other chronic pain: Secondary | ICD-10-CM | POA: Diagnosis not present

## 2021-09-25 DIAGNOSIS — K769 Liver disease, unspecified: Secondary | ICD-10-CM | POA: Diagnosis not present

## 2021-09-25 DIAGNOSIS — M810 Age-related osteoporosis without current pathological fracture: Secondary | ICD-10-CM | POA: Diagnosis not present

## 2021-10-09 DIAGNOSIS — I10 Essential (primary) hypertension: Secondary | ICD-10-CM | POA: Diagnosis not present

## 2021-10-10 DIAGNOSIS — K769 Liver disease, unspecified: Secondary | ICD-10-CM | POA: Diagnosis not present

## 2021-10-23 ENCOUNTER — Other Ambulatory Visit: Payer: Self-pay | Admitting: Internal Medicine

## 2021-10-23 DIAGNOSIS — I4819 Other persistent atrial fibrillation: Secondary | ICD-10-CM

## 2021-10-23 NOTE — Telephone Encounter (Signed)
Prescription refill request for Eliquis received. Indication:Afib Last office visit:4/23 Scr:0.7 Age: 76 Weight:96.2 kg  Prescription refilled

## 2021-10-24 DIAGNOSIS — G4700 Insomnia, unspecified: Secondary | ICD-10-CM | POA: Diagnosis not present

## 2021-10-24 DIAGNOSIS — I10 Essential (primary) hypertension: Secondary | ICD-10-CM | POA: Diagnosis not present

## 2021-10-24 DIAGNOSIS — I4891 Unspecified atrial fibrillation: Secondary | ICD-10-CM | POA: Diagnosis not present

## 2021-10-24 DIAGNOSIS — E785 Hyperlipidemia, unspecified: Secondary | ICD-10-CM | POA: Diagnosis not present

## 2021-10-24 DIAGNOSIS — G8929 Other chronic pain: Secondary | ICD-10-CM | POA: Diagnosis not present

## 2021-10-24 DIAGNOSIS — K219 Gastro-esophageal reflux disease without esophagitis: Secondary | ICD-10-CM | POA: Diagnosis not present

## 2021-10-29 DIAGNOSIS — L570 Actinic keratosis: Secondary | ICD-10-CM | POA: Diagnosis not present

## 2021-11-07 ENCOUNTER — Ambulatory Visit (INDEPENDENT_AMBULATORY_CARE_PROVIDER_SITE_OTHER): Payer: Medicare Other

## 2021-11-07 DIAGNOSIS — I495 Sick sinus syndrome: Secondary | ICD-10-CM

## 2021-11-11 LAB — CUP PACEART REMOTE DEVICE CHECK
Battery Remaining Longevity: 75 mo
Battery Remaining Percentage: 56 %
Battery Voltage: 3.01 V
Brady Statistic RV Percent Paced: 7 %
Date Time Interrogation Session: 20230902003517
Implantable Lead Implant Date: 20181211
Implantable Lead Implant Date: 20181211
Implantable Lead Location: 753859
Implantable Lead Location: 753860
Implantable Pulse Generator Implant Date: 20181211
Lead Channel Impedance Value: 350 Ohm
Lead Channel Pacing Threshold Amplitude: 0.75 V
Lead Channel Pacing Threshold Pulse Width: 0.5 ms
Lead Channel Sensing Intrinsic Amplitude: 6.4 mV
Lead Channel Setting Pacing Amplitude: 2.5 V
Lead Channel Setting Pacing Pulse Width: 0.5 ms
Lead Channel Setting Sensing Sensitivity: 2 mV
Pulse Gen Model: 2272
Pulse Gen Serial Number: 8967264

## 2021-11-19 ENCOUNTER — Other Ambulatory Visit: Payer: Medicare Other

## 2021-11-27 NOTE — Progress Notes (Signed)
Remote pacemaker transmission.   

## 2021-12-03 DIAGNOSIS — M818 Other osteoporosis without current pathological fracture: Secondary | ICD-10-CM | POA: Diagnosis not present

## 2021-12-03 DIAGNOSIS — R351 Nocturia: Secondary | ICD-10-CM | POA: Diagnosis not present

## 2021-12-03 DIAGNOSIS — Z8546 Personal history of malignant neoplasm of prostate: Secondary | ICD-10-CM | POA: Diagnosis not present

## 2021-12-03 DIAGNOSIS — C61 Malignant neoplasm of prostate: Secondary | ICD-10-CM | POA: Diagnosis not present

## 2021-12-03 DIAGNOSIS — N401 Enlarged prostate with lower urinary tract symptoms: Secondary | ICD-10-CM | POA: Diagnosis not present

## 2021-12-10 ENCOUNTER — Encounter
Payer: Medicare Other | Attending: Physical Medicine and Rehabilitation | Admitting: Physical Medicine and Rehabilitation

## 2021-12-10 ENCOUNTER — Encounter: Payer: Self-pay | Admitting: Physical Medicine and Rehabilitation

## 2021-12-10 VITALS — BP 125/68 | HR 62 | Ht 74.0 in | Wt 216.6 lb

## 2021-12-10 DIAGNOSIS — M5416 Radiculopathy, lumbar region: Secondary | ICD-10-CM | POA: Diagnosis not present

## 2021-12-10 DIAGNOSIS — S32050G Wedge compression fracture of fifth lumbar vertebra, subsequent encounter for fracture with delayed healing: Secondary | ICD-10-CM | POA: Insufficient documentation

## 2021-12-10 MED ORDER — HYDROCODONE-ACETAMINOPHEN 5-325 MG PO TABS: 1.0000 | ORAL_TABLET | Freq: Four times a day (QID) | ORAL | 0 refills | Status: DC | PRN

## 2021-12-10 NOTE — Progress Notes (Signed)
Subjective:    Patient ID: Todd Chapman, male    DOB: December 16, 1945, 76 y.o.   MRN: [id number]  HPI Pt is a 76 yr old male with hx of chronic back pain with L5 vertebral body compression fx s/p kyphoplasty, hx of prostate CA- no mets; cancer related fatigue, HTN; hyponatremia in hospital;  Here for hospital f/u on kyphoplasty.  Still getting hormonal therapy and having night sweats.    Steadily improving and noticed strength doing better.   Tries to do stretching- massage therapy 2x/month.  Lymphatic drainage therapy for LE's. Daily/nightly.    In Afib all the time. Has to catch breath after 30 paces.    Still very busy- still judging and lawyering.   Takes norco very sparingly- to get some sleep at night-  Takes 1/2 to 1/4 of Norco at a time.   Wife, [name] broke wrist walking and R elbow- bene nursing here since Labor day-   Dr Koleen Nimrod is covering- Dr Ninetta Lights is retiring [facility]- cancer is GONE!!!!! Dr Beatrix Fetters is retiring.    Pain Inventory Average Pain 5 Pain Right Now 7 My pain is sharp, burning, dull, and stabbing  In the last 24 hours, has pain interfered with the following? General activity 5 Relation with others 5 Enjoyment of life 5 What TIME of day is your pain at its worst? evening and night Sleep (in general) Fair  Pain is worse with: unsure Pain improves with: medication Relief from Meds: 7  Family History  Problem Relation Age of Onset   CVA Mother    Coronary artery disease Mother    Stroke Mother    Coronary artery disease Father    Heart attack Father    Stomach cancer Maternal Grandmother    Breast cancer Neg Hx    Prostate cancer Neg Hx    Colon cancer Neg Hx    Pancreatic cancer Neg Hx    Social History   Socioeconomic History   Marital status: Married    Spouse name: Not on file   Number of children: 2   Years of education: Not on file   Highest education level: Not on file  Occupational History   Not on file  Tobacco Use    Smoking status: Former    Packs/day: 0.12    Years: 8.00    Total pack years: 0.96    Types: Cigarettes    Quit date: 11/24/1965    Years since quitting: 56.0   Smokeless tobacco: Never  Vaping Use   Vaping Use: Never used  Substance and Sexual Activity   Alcohol use: Yes    Comment: 02/16/2017 "might have 1 drink/month"   Drug use: No   Sexual activity: Yes  Other Topics Concern   Not on file  Social History Narrative   He is a judge   Social Determinants of Health   Financial Resource Strain: Not on file  Food Insecurity: Not on file  Transportation Needs: Not on file  Physical Activity: Not on file  Stress: Not on file  Social Connections: Not on file   Past Surgical History:  Procedure Laterality Date   APPENDECTOMY     age 62   CARDIOVERSION  02/16/2017   CARDIOVERSION N/A 02/16/2017   Procedure: CARDIOVERSION;  Surgeon: Thompson Grayer, MD;  Location: [facility];  Service: Cardiovascular;  Laterality: N/A;   COLONOSCOPY WITH PROPOFOL N/A 12/13/2012   Procedure: COLONOSCOPY WITH PROPOFOL;  Surgeon: Garlan Fair, MD;  Location:  [facility];  Service: Endoscopy;  Laterality: N/A;   FRACTURE SURGERY     HYDROCELE EXCISION Left 05/16/2018   Procedure: HYDROCELECTOMY ADULT;  Surgeon: [name], MD;  Location: [facility];  Service: Urology;  Laterality: Left;   INSERT / REPLACE / REMOVE PACEMAKER  02/16/2017   KYPHOPLASTY N/A 02/10/2021   Procedure: KYPHOPLASTY L-5;  Surgeon: Newman Pies, MD;  Location: [facility];  Service: Neurosurgery;  Laterality: N/A;   LAPAROSCOPY N/A 06/04/2020   Procedure: POSSIBLE LAPAROTOMY POSSIBLE BOWEL RESSECTION;  Surgeon: Clovis Riley, MD;  Location: [facility];  Service: General;  Laterality: N/A;   PACEMAKER IMPLANT N/A 02/16/2017   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 by Dr Rayann Heman   PROSTATE BIOPSY  11/23/2019   WRIST FRACTURE SURGERY Left     with pins   Past Surgical History:  Procedure Laterality Date   APPENDECTOMY     age 68   CARDIOVERSION  02/16/2017   CARDIOVERSION N/A 02/16/2017   Procedure: CARDIOVERSION;  Surgeon: Thompson Grayer, MD;  Location: [facility];  Service: Cardiovascular;  Laterality: N/A;   COLONOSCOPY WITH PROPOFOL N/A 12/13/2012   Procedure: COLONOSCOPY WITH PROPOFOL;  Surgeon: Garlan Fair, MD;  [facility];  Service: Endoscopy;  Laterality: N/A;   FRACTURE SURGERY     HYDROCELE EXCISION Left 05/16/2018   Procedure: HYDROCELECTOMY ADULT;  Surgeon: [name], MD;  [facility];  Service: Urology;  Laterality: Left;   INSERT / REPLACE / REMOVE PACEMAKER  02/16/2017   KYPHOPLASTY N/A 02/10/2021   Procedure: KYPHOPLASTY L-5;  Surgeon: Newman Pies, MD;  Location: [place];  Service: Neurosurgery;  Laterality: N/A;   LAPAROSCOPY N/A 06/04/2020   Procedure: POSSIBLE LAPAROTOMY POSSIBLE BOWEL RESSECTION;  Surgeon: Clovis Riley, MD;  [facility]: [facility];  Service: General;  Laterality: N/A;   PACEMAKER IMPLANT N/A 02/16/2017   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 by Dr Rayann Heman   PROSTATE BIOPSY  11/23/2019   WRIST FRACTURE SURGERY Left    with pins   Past Medical History:  Diagnosis Date   Arthritis    "thumbs, back" (02/16/2017)   Bradycardia 02/2017   Dyspnea    with exertion   Flu 2 weeks ago   GERD (gastroesophageal reflux disease)    Hepatitis A as child   HLD (hyperlipidemia)    Persistent atrial fibrillation ([place]) 12/18/2016   Pneumonia 1999; 2018   Presence of permanent cardiac pacemaker    st jude   Prostate cancer (HCC)    BP 125/68   Pulse 62   Ht '6\' 2"'$  (1.88 m)   Wt 216 lb 9.6 oz (98.2 kg)   SpO2 96%   BMI 27.81 kg/m   Opioid Risk Score:   Fall Risk Score:  `1  Depression screen [facility] 2/9     05/02/2021    2:41 PM  Depression screen PHQ 2/9  Decreased  Interest 0  Down, Depressed, Hopeless 0  PHQ - 2 Score 0  Altered sleeping 3  Tired, decreased energy 2  Change in appetite 0  Feeling bad or failure about yourself  0  Trouble concentrating 0  Moving slowly or fidgety/restless 0  Suicidal thoughts 0  PHQ-9 Score 5  Difficult doing work/chores Somewhat difficult     Review of Systems  Musculoskeletal:        Right buttocks pain  All other systems reviewed and are  negative.     Objective:   Physical Exam  Awake, alert, appropriate, NAD no assistive device Midline TTP and just to Right side at L5.       Assessment & Plan:   Pt is a 76 yr old male with hx of chronic back pain with L5 vertebral body compression fx s/p kyphoplasty, hx of prostate CA- no mets; cancer related fatigue, HTN; hyponatremia in hospital;  Here for hospital f/u on kyphoplasty.  Still getting hormonal therapy and having night sweats.    1. Continue stretching and ROM multiple times per day.    2. Refill Norco 5/325 mg q6 hours as needed #120  3. Doesn't need UDS today- did last visit.   4. F/U in 3 months

## 2021-12-10 NOTE — Patient Instructions (Signed)
Pt is a 76 yr old male with hx of chronic back pain with L5 vertebral body compression fx s/p kyphoplasty, hx of prostate CA- no mets; cancer related fatigue, HTN; hyponatremia in hospital;  Here for hospital f/u on kyphoplasty.  Still getting hormonal therapy and having night sweats.    1. Continue stretching and ROM multiple times per day.    2. Refill Norco 5/325 mg q6 hours as needed #120  3. Doesn't need UDS today- did last visit.   4. F/U in 3 months

## 2021-12-23 ENCOUNTER — Other Ambulatory Visit (HOSPITAL_BASED_OUTPATIENT_CLINIC_OR_DEPARTMENT_OTHER): Payer: Self-pay

## 2021-12-26 ENCOUNTER — Other Ambulatory Visit (HOSPITAL_BASED_OUTPATIENT_CLINIC_OR_DEPARTMENT_OTHER): Payer: Self-pay

## 2021-12-26 DIAGNOSIS — Z23 Encounter for immunization: Secondary | ICD-10-CM | POA: Diagnosis not present

## 2021-12-26 MED ORDER — COMIRNATY 30 MCG/0.3ML IM SUSY
PREFILLED_SYRINGE | INTRAMUSCULAR | 0 refills | Status: DC
  Filled 2021-12-26: qty 0.3, 1d supply, fill #0

## 2021-12-26 MED ORDER — INFLUENZA VAC A&B SA ADJ QUAD 0.5 ML IM PRSY
PREFILLED_SYRINGE | INTRAMUSCULAR | 0 refills | Status: DC
  Filled 2021-12-26: qty 0.5, 1d supply, fill #0

## 2022-01-23 ENCOUNTER — Other Ambulatory Visit (HOSPITAL_BASED_OUTPATIENT_CLINIC_OR_DEPARTMENT_OTHER): Payer: Self-pay

## 2022-02-05 ENCOUNTER — Other Ambulatory Visit (HOSPITAL_BASED_OUTPATIENT_CLINIC_OR_DEPARTMENT_OTHER): Payer: Self-pay

## 2022-02-06 ENCOUNTER — Ambulatory Visit (INDEPENDENT_AMBULATORY_CARE_PROVIDER_SITE_OTHER): Payer: Medicare Other

## 2022-02-06 DIAGNOSIS — I495 Sick sinus syndrome: Secondary | ICD-10-CM | POA: Diagnosis not present

## 2022-02-09 ENCOUNTER — Other Ambulatory Visit (HOSPITAL_BASED_OUTPATIENT_CLINIC_OR_DEPARTMENT_OTHER): Payer: Self-pay

## 2022-02-09 LAB — CUP PACEART REMOTE DEVICE CHECK
Date Time Interrogation Session: 20231201103208
Implantable Lead Connection Status: 753985
Implantable Lead Connection Status: 753985
Implantable Lead Implant Date: 20181211
Implantable Lead Implant Date: 20181211
Implantable Lead Location: 753859
Implantable Lead Location: 753860
Implantable Pulse Generator Implant Date: 20181211
Pulse Gen Model: 2272
Pulse Gen Serial Number: 8967264

## 2022-02-09 MED ORDER — AREXVY 120 MCG/0.5ML IM SUSR
INTRAMUSCULAR | 0 refills | Status: DC
  Filled 2022-02-09: qty 0.5, 1d supply, fill #0

## 2022-02-24 NOTE — Progress Notes (Signed)
Remote pacemaker transmission.   

## 2022-03-25 ENCOUNTER — Encounter: Payer: Self-pay | Admitting: Physical Medicine and Rehabilitation

## 2022-03-25 ENCOUNTER — Encounter
Payer: Medicare Other | Attending: Physical Medicine and Rehabilitation | Admitting: Physical Medicine and Rehabilitation

## 2022-03-25 VITALS — BP 137/82 | HR 61 | Wt 216.0 lb

## 2022-03-25 DIAGNOSIS — G894 Chronic pain syndrome: Secondary | ICD-10-CM

## 2022-03-25 DIAGNOSIS — M810 Age-related osteoporosis without current pathological fracture: Secondary | ICD-10-CM

## 2022-03-25 DIAGNOSIS — Z5181 Encounter for therapeutic drug level monitoring: Secondary | ICD-10-CM | POA: Diagnosis not present

## 2022-03-25 DIAGNOSIS — S32050G Wedge compression fracture of fifth lumbar vertebra, subsequent encounter for fracture with delayed healing: Secondary | ICD-10-CM

## 2022-03-25 DIAGNOSIS — M5416 Radiculopathy, lumbar region: Secondary | ICD-10-CM

## 2022-03-25 DIAGNOSIS — Z79891 Long term (current) use of opiate analgesic: Secondary | ICD-10-CM | POA: Diagnosis not present

## 2022-03-25 HISTORY — DX: Chronic pain syndrome: G89.4

## 2022-03-25 MED ORDER — HYDROCODONE-ACETAMINOPHEN 5-325 MG PO TABS: 1.0000 | ORAL_TABLET | Freq: Four times a day (QID) | ORAL | 0 refills | Status: DC | PRN

## 2022-03-25 MED ORDER — LIDOCAINE 5 % EX PTCH
2.0000 | MEDICATED_PATCH | CUTANEOUS | 1 refills | Status: DC
Start: 2022-03-25 — End: 2023-12-07

## 2022-03-25 NOTE — Patient Instructions (Signed)
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  Still getting hormonal therapy and having night sweats.  Per clinic policy, will do  urine drug screen- - will likely be negative for pain meds- but need to do per policy.   2. Try taking Tums 2 pills/day- will help with osteoporosis; con't Vit D and Fosamax.    3. Will retry Lidoderm patches-  2 patches-  12 hrs on; 12 hrs off- since having sciatic pain- and it's been helpful in past.    4. Hold pressure with tennis ball- for 2 minutes -4 minutes-   5. Refill Norco- up to 5/325 mg up to 4x day as needed-   6. Educated pt about why having pain in R "sciatic area"- and used pictures to do so.    7. Needs weight bearing exercises, so pool therapy will not help osteoporosis.    8. F/U in 3 months- on chronic pain/compression fx.

## 2022-03-25 NOTE — Progress Notes (Signed)
Subjective:    Patient ID: Todd Chapman, male    DOB: 11-21-1945, 77 y.o.   MRN: [id number]  HPI Pt is a 77 yr old male with hx of chronic back pain with L5 vertebral body compression fx s/p kyphoplasty, hx of prostate CA- no mets; cancer related fatigue, HTN; hyponatremia in hospital;  Here for  f/u on kyphoplasty and chronic pain due to compression fx at L5.  Still getting hormonal therapy and having night sweats.  Things better-  L5 compression fx- gets sore  Pain is now from sciatica- on R side- Tries ot keep it loose Found a good masseuse and helped a LOT-  Also helped circulation in legs, etc.  Spent a lot of money with her.    Has been able to get back into gym.   Hasn't been able to do elliptical yet.  Taking 3Grams of tylenol/day-   Use Norco occasionally, but not regularly.  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 Even 1/2 pill is helpful- usually takes 10 pills/month at most- tries ot break in half when takes.  Only takes when working-    Has ~ 8-10 pills/left- last filled 12/10/21.    Main side effect is constipation- nice to  have when need it-    Dr Beatrix Fetters- retiring in June 2024 gave him Fosamax.  Went for bone density scan- needs to take  Also Vit D and Calcium- didn't poop for 1 month.  Has true Osteoporosis- not taking calcium pills.   Lifting light weights.   Pain isolated in R sciatic area- not down leg, but mainly paraspinals on R around L5. Some SI joint pain.    Hasn't tried sitting on tennis ball    Pain Inventory Average Pain 4 Pain Right Now 4 My pain is intermittent, constant, burning, and dull  In the last 24 hours, has pain interfered with the following? General activity 6 Relation with others 6 Enjoyment of life 6 What TIME of day is your pain at its worst? daytime and night Sleep (in general) Fair  Pain is worse with: inactivity Pain improves with: rest, therapy/exercise,  and medication Relief from Meds: 7  Family History  Problem Relation Age of Onset   CVA Mother    Coronary artery disease Mother    Stroke Mother    Coronary artery disease Father    Heart attack Father    Stomach cancer Maternal Grandmother    Breast cancer Neg Hx    Prostate cancer Neg Hx    Colon cancer Neg Hx    Pancreatic cancer Neg Hx    Social History   Socioeconomic History   Marital status: Married    Spouse name: Not on file   Number of children: 2   Years of education: Not on file   Highest education level: Not on file  Occupational History   Not on file  Tobacco Use   Smoking status: Former    Packs/day: 0.12    Years: 8.00    Total pack years: 0.96    Types: Cigarettes    Quit date: 11/24/1965    Years since quitting: 56.3   Smokeless tobacco: Never  Vaping Use   Vaping Use: Never used  Substance and Sexual Activity   Alcohol use: Yes    Comment: 02/16/2017 "might have 1 drink/month"   Drug use: No   Sexual activity: Yes  Other Topics Concern   Not on file  Social History Narrative   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 Strain: Not on file  Food Insecurity: Not on file  Transportation Needs: Not on file  Physical Activity: Not on file  Stress: Not on file  Social Connections: Not on file   Past Surgical History:  Procedure Laterality Date   APPENDECTOMY     age 26   CARDIOVERSION  02/16/2017   CARDIOVERSION N/A 02/16/2017   Procedure: CARDIOVERSION;  Surgeon: Thompson Grayer, MD;  Location: [facility];  Service: Cardiovascular;  Laterality: N/A;   COLONOSCOPY WITH PROPOFOL N/A 12/13/2012   Procedure: COLONOSCOPY WITH PROPOFOL;  Surgeon: Garlan Fair, MD;  [facility];  Service: Endoscopy;  Laterality: N/A;   FRACTURE SURGERY     HYDROCELE EXCISION Left 05/16/2018   Procedure: HYDROCELECTOMY ADULT;  Surgeon: [name], MD;  Location: [facility];  Service: Urology;   Laterality: Left;   INSERT / REPLACE / REMOVE PACEMAKER  02/16/2017   KYPHOPLASTY N/A 02/10/2021   Procedure: KYPHOPLASTY L-5;  Surgeon: Newman Pies, MD;  Location: [facility] [facility];  Service: Neurosurgery;  Laterality: N/A;   LAPAROSCOPY N/A 06/04/2020   Procedure: POSSIBLE LAPAROTOMY POSSIBLE BOWEL RESSECTION;  Surgeon: Clovis Riley, MD;  [facility];  Service: General;  Laterality: N/A;   PACEMAKER IMPLANT N/A 02/16/2017   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 by Dr Rayann Heman   PROSTATE BIOPSY  11/23/2019   WRIST FRACTURE SURGERY Left    with pins   Past Surgical History:  Procedure Laterality Date   APPENDECTOMY     age 22   CARDIOVERSION  02/16/2017   CARDIOVERSION N/A 02/16/2017   Procedure: CARDIOVERSION;  Surgeon: Thompson Grayer, MD;  Location: [facility];  Service: Cardiovascular;  Laterality: N/A;   COLONOSCOPY WITH PROPOFOL N/A 12/13/2012   Procedure: COLONOSCOPY WITH PROPOFOL;  Surgeon: Garlan Fair, MD;  [facility];  Service: Endoscopy;  Laterality: N/A;   FRACTURE SURGERY     HYDROCELE EXCISION Left 05/16/2018   Procedure: HYDROCELECTOMY ADULT;  Surgeon: [name], MD;  Location: [facility];  Service: Urology;  Laterality: Left;   INSERT / REPLACE / REMOVE PACEMAKER  02/16/2017   KYPHOPLASTY N/A 02/10/2021   Procedure: KYPHOPLASTY L-5;  Surgeon: Newman Pies, MD;  Location: [place];  Service: Neurosurgery;  Laterality: N/A;   LAPAROSCOPY N/A 06/04/2020   Procedure: POSSIBLE LAPAROTOMY POSSIBLE BOWEL RESSECTION;  Surgeon: Clovis Riley, MD;  [facility]: [facility];  Service: General;  Laterality: N/A;   PACEMAKER IMPLANT N/A 02/16/2017   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 by Dr Rayann Heman   PROSTATE BIOPSY  11/23/2019   WRIST FRACTURE SURGERY Left    with pins   Past Medical History:  Diagnosis Date    Arthritis    "thumbs, back" (02/16/2017)   Bradycardia 02/2017   Dyspnea    with exertion   Flu 2 weeks ago   GERD (gastroesophageal reflux disease)    Hepatitis A as child   HLD (hyperlipidemia)    Persistent atrial fibrillation ([place]) 12/18/2016   Pneumonia 1999; 2018   Presence of permanent cardiac pacemaker    st jude   Prostate cancer (HCC)    Wt 216 lb (98 kg)   BMI 27.73 kg/m   Opioid Risk Score:   Fall Risk Score:  `1  Depression screen [facility] 2/9  05/02/2021    2:41 PM  Depression screen PHQ 2/9  Decreased Interest 0  Down, Depressed, Hopeless 0  PHQ - 2 Score 0  Altered sleeping 3  Tired, decreased energy 2  Change in appetite 0  Feeling bad or failure about yourself  0  Trouble concentrating 0  Moving slowly or fidgety/restless 0  Suicidal thoughts 0  PHQ-9 Score 5  Difficult doing work/chores Somewhat difficult    Review of Systems  Musculoskeletal:  Positive for back pain.  All other systems reviewed and are negative.      Objective:   Physical Exam  Awake, alert, appropriate, sitting on exam,table, NAD      Assessment & Plan:   Pt is a 77 yr old male with hx of chronic back pain with L5 vertebral body compression fx s/p kyphoplasty, hx of prostate CA- no mets; cancer related fatigue, HTN; hyponatremia in hospital;  Here for  f/u on kyphoplasty and chronic pain due to compression fx at L5.  Still getting hormonal therapy and having night sweats.  Per clinic policy, will do  urine drug screen- - will likely be negative for pain meds- but need to do per policy.   2. Try taking Tums 2 pills/day- will help with osteoporosis; con't Vit D and Fosamax.    3. Will retry Lidoderm patches-  2 patches-  12 hrs on; 12 hrs off- since having sciatic pain- and it's been helpful in past.    4. Hold pressure with tennis ball- for 2 minutes -4 minutes-   5. Refill Norco- up to 5/325 mg up to 4x day as needed-   6. Educated pt about why having pain in  R "sciatic area"- and used pictures to do so.    7. Needs weight bearing exercises, so pool therapy will not help osteoporosis.    8. F/U in 3 months- on chronic pain/compression fx.    I spent a total of  32  minutes on total care today- >50% coordination of care- due to d/w pt about osteoporosis, pain, and education.

## 2022-03-28 LAB — TOXASSURE SELECT,+ANTIDEPR,UR

## 2022-05-08 ENCOUNTER — Ambulatory Visit: Payer: Medicare Other

## 2022-05-08 DIAGNOSIS — I495 Sick sinus syndrome: Secondary | ICD-10-CM

## 2022-05-08 LAB — CUP PACEART REMOTE DEVICE CHECK
Battery Remaining Longevity: 72 mo
Battery Remaining Percentage: 53 %
Battery Voltage: 3.01 V
Brady Statistic RV Percent Paced: 5.6 %
Date Time Interrogation Session: 20240301041435
Implantable Lead Connection Status: 753985
Implantable Lead Connection Status: 753985
Implantable Lead Implant Date: 20181211
Implantable Lead Implant Date: 20181211
Implantable Lead Location: 753859
Implantable Lead Location: 753860
Implantable Pulse Generator Implant Date: 20181211
Lead Channel Impedance Value: 390 Ohm
Lead Channel Pacing Threshold Amplitude: 0.75 V
Lead Channel Pacing Threshold Pulse Width: 0.5 ms
Lead Channel Sensing Intrinsic Amplitude: 10.5 mV
Lead Channel Setting Pacing Amplitude: 2.5 V
Lead Channel Setting Pacing Pulse Width: 0.5 ms
Lead Channel Setting Sensing Sensitivity: 2 mV
Pulse Gen Model: 2272
Pulse Gen Serial Number: 8967264

## 2022-05-20 DIAGNOSIS — C61 Malignant neoplasm of prostate: Secondary | ICD-10-CM | POA: Diagnosis not present

## 2022-05-20 DIAGNOSIS — R351 Nocturia: Secondary | ICD-10-CM | POA: Diagnosis not present

## 2022-05-20 DIAGNOSIS — N401 Enlarged prostate with lower urinary tract symptoms: Secondary | ICD-10-CM | POA: Diagnosis not present

## 2022-06-01 ENCOUNTER — Other Ambulatory Visit: Payer: Self-pay

## 2022-06-01 DIAGNOSIS — I4819 Other persistent atrial fibrillation: Secondary | ICD-10-CM

## 2022-06-01 MED ORDER — APIXABAN 5 MG PO TABS: ORAL_TABLET | ORAL | 0 refills | Status: DC

## 2022-06-01 NOTE — Telephone Encounter (Signed)
Prescription refill request for Eliquis received. Indication: Afib  Last office visit: 06/20/21 ([place])  Scr: 0.77 (06/11/21)  Age: 77 Weight: 98kg  Appropriate dose. Refill sent.

## 2022-06-05 NOTE — Progress Notes (Signed)
Remote pacemaker transmission.   

## 2022-06-12 DIAGNOSIS — I10 Essential (primary) hypertension: Secondary | ICD-10-CM | POA: Diagnosis not present

## 2022-06-12 DIAGNOSIS — S0990XA Unspecified injury of head, initial encounter: Secondary | ICD-10-CM | POA: Diagnosis not present

## 2022-06-12 DIAGNOSIS — R0602 Shortness of breath: Secondary | ICD-10-CM | POA: Diagnosis not present

## 2022-06-12 DIAGNOSIS — I4891 Unspecified atrial fibrillation: Secondary | ICD-10-CM | POA: Diagnosis not present

## 2022-06-24 ENCOUNTER — Encounter: Payer: Self-pay | Admitting: Physical Medicine and Rehabilitation

## 2022-06-24 ENCOUNTER — Encounter
Payer: Medicare Other | Attending: Physical Medicine and Rehabilitation | Admitting: Physical Medicine and Rehabilitation

## 2022-06-24 VITALS — BP 138/92 | HR 58 | Ht 74.0 in | Wt 220.8 lb

## 2022-06-24 DIAGNOSIS — R0602 Shortness of breath: Secondary | ICD-10-CM | POA: Insufficient documentation

## 2022-06-24 DIAGNOSIS — G5703 Lesion of sciatic nerve, bilateral lower limbs: Secondary | ICD-10-CM | POA: Insufficient documentation

## 2022-06-24 DIAGNOSIS — M5416 Radiculopathy, lumbar region: Secondary | ICD-10-CM | POA: Insufficient documentation

## 2022-06-24 DIAGNOSIS — S32050G Wedge compression fracture of fifth lumbar vertebra, subsequent encounter for fracture with delayed healing: Secondary | ICD-10-CM | POA: Insufficient documentation

## 2022-06-24 NOTE — Progress Notes (Signed)
Subjective:    Patient ID: Todd Chapman, male    DOB: 05-11-1945, 77 y.o.   MRN: [id number]  HPI  Pt is a 77 yr old male with hx of chronic back pain with L5 vertebral body compression fx s/p kyphoplasty, hx of prostate CA- no mets; cancer related fatigue, HTN; hyponatremia in hospital;  Here for  f/u on kyphoplasty and chronic pain due to compression fx at L5.  Stopped Chemo/etc.   Dong fine Off Pain meds- taking tylenol now  Has some pain meds left, but not taking.   Getting through with tylenol, activity, massage and exercise.    HEP- legs, abds and hamstrings- and aerobic exercise- on ellipitcal or nustep- gets to use at [facility].  Goes 3x/week minimum, but does something every day.  Also does stretching 2x/day.  Sees Massage therapist- 2x/month-  for a little while, doesn't feel pain,   Otherwis,e dull ache and burning constantly.  Every so often, aching more intense in same spot- SI joint.  Piriformis is his main problem.   Does tennis ball and roller on buttocks as well-   Tylenol- Taking 3 Grams/day- 650 mg x2 in AM and 500 mg at 4pm and at night, takes 1000 mg at night.   Sleeping pretty well.  Getting up ot pee, not pain usually.   Stopped drinking alcohol entirely, before Thanksgiving 2023.   Trying to lose 5 lbs. Hard to do.  Working on staying erect- standing straight up as much as possible.    Getting sleepy more, and SOB.   Urologist told him - might get a little Testerone- since stopped chemo-     Social Hx: Still working.  Going to [employer] for work next week.     Pain Inventory Average Pain 7 Pain Right Now 7 My pain is constant and aching  In the last 24 hours, has pain interfered with the following? General activity 4 Relation with others 1 Enjoyment of life 4 What TIME of day is your pain at its worst? evening and night Sleep (in general) Fair  Pain is worse with: inactivity and some activites Pain improves with: rest and  therapy/exercise Relief from Meds:  na  Family History  Problem Relation Age of Onset   CVA Mother    Coronary artery disease Mother    Stroke Mother    Coronary artery disease Father    Heart attack Father    Stomach cancer Maternal Grandmother    Breast cancer Neg Hx    Prostate cancer Neg Hx    Colon cancer Neg Hx    Pancreatic cancer Neg Hx    Social History   Socioeconomic History   Marital status: Married    Spouse name: Not on file   Number of children: 2   Years of education: Not on file   Highest education level: Not on file  Occupational History   Not on file  Tobacco Use   Smoking status: Former    Packs/day: 0.12    Years: 8.00    Additional pack years: 0.00    Total pack years: 0.96    Types: Cigarettes    Quit date: 11/24/1965    Years since quitting: 56.6   Smokeless tobacco: Never  Vaping Use   Vaping Use: Never used  Substance and Sexual Activity   Alcohol use: Yes    Comment: 02/16/2017 "might have 1 drink/month"   Drug use: No   Sexual activity: Yes  Other Topics Concern  Not on file  Social History Narrative   He is a judge   Social Determinants of Health   Financial Resource Strain: Not on file  Food Insecurity: Not on file  Transportation Needs: Not on file  Physical Activity: Not on file  Stress: Not on file  Social Connections: Not on file   Past Surgical History:  Procedure Laterality Date   APPENDECTOMY     age 17   CARDIOVERSION  02/16/2017   CARDIOVERSION N/A 02/16/2017   Procedure: CARDIOVERSION;  Surgeon: Hillis Range, MD;  Location: [facility];  Service: Cardiovascular;  Laterality: N/A;   COLONOSCOPY WITH PROPOFOL N/A 12/13/2012   Procedure: COLONOSCOPY WITH PROPOFOL;  Surgeon: Charolett Bumpers, MD;  Location: [facility];  Service: Endoscopy;  Laterality: N/A;   FRACTURE SURGERY     HYDROCELE EXCISION Left 05/16/2018   Procedure: HYDROCELECTOMY ADULT;  Surgeon: Marcine Matar, MD;  Location: [facility];  Service: Urology;  Laterality: Left;   INSERT / REPLACE / REMOVE PACEMAKER  02/16/2017   KYPHOPLASTY N/A 02/10/2021   Procedure: KYPHOPLASTY L-5;  Surgeon: Tressie Stalker, MD;  Location: [facility] OR;  Service: Neurosurgery;  Laterality: N/A;   LAPAROSCOPY N/A 06/04/2020   Procedure: POSSIBLE LAPAROTOMY POSSIBLE BOWEL RESSECTION;  Surgeon: Berna Bue, MD;  Location: [facility];  Service: General;  Laterality: N/A;   PACEMAKER IMPLANT N/A 02/16/2017   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 by Dr Johney Frame   PROSTATE BIOPSY  11/23/2019   WRIST FRACTURE SURGERY Left    with pins   Past Surgical History:  Procedure Laterality Date   APPENDECTOMY     age 16   CARDIOVERSION  02/16/2017   CARDIOVERSION N/A 02/16/2017   Procedure: CARDIOVERSION;  Surgeon: Hillis Range, MD;  Location: [facility];  Service: Cardiovascular;  Laterality: N/A;   COLONOSCOPY WITH PROPOFOL N/A 12/13/2012   Procedure: COLONOSCOPY WITH PROPOFOL;  Surgeon: Charolett Bumpers, MD;  Location: [facility];  Service: Endoscopy;  Laterality: N/A;   FRACTURE SURGERY     HYDROCELE EXCISION Left 05/16/2018   Procedure: HYDROCELECTOMY ADULT;  Surgeon: Marcine Matar, MD;  Location: [facility];  Service: Urology;  Laterality: Left;   INSERT / REPLACE / REMOVE PACEMAKER  02/16/2017   KYPHOPLASTY N/A 02/10/2021   Procedure: KYPHOPLASTY L-5;  Surgeon: Tressie Stalker, MD;  Location: [facility] OR;  Service: Neurosurgery;  Laterality: N/A;   LAPAROSCOPY N/A 06/04/2020   Procedure: POSSIBLE LAPAROTOMY POSSIBLE BOWEL RESSECTION;  Surgeon: Berna Bue, MD;  Location: [facility];  Service: General;  Laterality: N/A;   PACEMAKER IMPLANT N/A 02/16/2017   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 by Dr Johney Frame   PROSTATE BIOPSY  11/23/2019   WRIST FRACTURE SURGERY Left    with pins   Past Medical  History:  Diagnosis Date   Arthritis    "thumbs, back" (02/16/2017)   Bradycardia 02/2017   Dyspnea    with exertion   Flu 2 weeks ago   GERD (gastroesophageal reflux disease)    Hepatitis A as child   HLD (hyperlipidemia)    Persistent atrial fibrillation 12/18/2016   Pneumonia 1999; 2018   Presence of permanent cardiac pacemaker    st jude   Prostate cancer    BP (!) 138/92   Pulse (!) 58   Ht 6\' 2"  (1.88 m)   Wt 220 lb 12.8 oz (100.2 kg)   SpO2  99%   BMI 28.35 kg/m   Opioid Risk Score:   Fall Risk Score:  `1  Depression screen [facility] 2/9     06/24/2022   11:02 AM 03/25/2022   11:44 AM 05/02/2021    2:41 PM  Depression screen PHQ 2/9  Decreased Interest 0 0 0  Down, Depressed, Hopeless 0 0 0  PHQ - 2 Score 0 0 0  Altered sleeping   3  Tired, decreased energy   2  Change in appetite   0  Feeling bad or failure about yourself    0  Trouble concentrating   0  Moving slowly or fidgety/restless   0  Suicidal thoughts   0  PHQ-9 Score   5  Difficult doing work/chores   Somewhat difficult     Review of Systems  Constitutional: Negative.   HENT: Negative.    Eyes: Negative.   Respiratory: Negative.    Cardiovascular: Negative.   Gastrointestinal: Negative.   Endocrine: Negative.   Genitourinary: Negative.   Musculoskeletal:  Positive for back pain.  Skin: Negative.   Allergic/Immunologic: Negative.   Neurological: Negative.   Hematological:  Bruises/bleeds easily.       Apixaban  Psychiatric/Behavioral: Negative.    All other systems reviewed and are negative.      Objective:   Physical Exam  Awake, alert, appropriate, sitting in chair, has better posture, NAD In afib- rate slightly bradycardic CTA B/_ no W/R/R- but decreased at bases B/L  TTP over piriformis B/L     Assessment & Plan:   Pt is a 77 yr old male with hx of chronic back pain with L5 vertebral body compression fx s/p kyphoplasty, hx of prostate CA- no mets; cancer related fatigue, HTN;  hyponatremia in hospital;  Here for  f/u on kyphoplasty and chronic pain due to compression fx at L5.    Take Tylenol 4 days/week in Am at 650 mg in AM; and 3 days/week 1300 mg in AM.   2.  Back brace- try to use no more than 6 hours/day for short term use and 4 hours/day long term use.    3.  Called a Flutter valve- need to  use- at least 4x/day- when awake- do 10 repetitions.  Breakfast, lunch dinner, bedtime- open the base of your lungs up- should hopefully help some Shortness of breath.  Instead of adding meds.    4. If that doesn't work, call PCP and see if can get inhaler as needed.    5. Smoked in early 20's- but stopped early on, so unlikely COPD. But could have from second hand smoke?   6. Suggest calling Pulmonary- has seen in past.    7. Will f/u in 6 months- spread time out, but since not prescribing pain meds, don't need to see regularly- can cancel if doing well.    I spent a total of 23   minutes on total care today- >50% coordination of care- due to discussion of pain meds- tylenol- d/w pt about breathing issues

## 2022-06-24 NOTE — Patient Instructions (Signed)
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    Take Tylenol 4 days/week in Am at 650 mg in AM; and 3 days/week 1300 mg in AM.   2.  Back brace- try to use no more than 6 hours/day for short term use and 4 hours/day long term use.    3.  Called a Flutter valve- need to  use- at least 4x/day- when awake- do 10 repetitions.  Breakfast, lunch dinner, bedtime- open the base of your lungs up- should hopefully help some Shortness of breath.  Instead of adding meds.    4. If that doesn't work, call PCP and see if can get inhaler as needed.    5. Smoked in early 20's- but stopped early on, so unlikely COPD. But could have from second hand smoke?   6. Suggest calling Pulmonary- has seen in past.    7. Will f/u in 6 months- spread time out, but since not prescribing pain meds, don't need to see regularly- can cancel if doing well.

## 2022-07-05 DIAGNOSIS — Z03818 Encounter for observation for suspected exposure to other biological agents ruled out: Secondary | ICD-10-CM | POA: Diagnosis not present

## 2022-07-05 DIAGNOSIS — R52 Pain, unspecified: Secondary | ICD-10-CM | POA: Diagnosis not present

## 2022-07-05 DIAGNOSIS — B349 Viral infection, unspecified: Secondary | ICD-10-CM | POA: Diagnosis not present

## 2022-07-05 DIAGNOSIS — R0981 Nasal congestion: Secondary | ICD-10-CM | POA: Diagnosis not present

## 2022-07-05 DIAGNOSIS — R051 Acute cough: Secondary | ICD-10-CM | POA: Diagnosis not present

## 2022-07-09 DIAGNOSIS — Z0000 Encounter for general adult medical examination without abnormal findings: Secondary | ICD-10-CM | POA: Diagnosis not present

## 2022-07-09 DIAGNOSIS — D649 Anemia, unspecified: Secondary | ICD-10-CM | POA: Diagnosis not present

## 2022-07-09 DIAGNOSIS — I4891 Unspecified atrial fibrillation: Secondary | ICD-10-CM | POA: Diagnosis not present

## 2022-07-09 DIAGNOSIS — D6869 Other thrombophilia: Secondary | ICD-10-CM | POA: Diagnosis not present

## 2022-07-09 DIAGNOSIS — Z79899 Other long term (current) drug therapy: Secondary | ICD-10-CM | POA: Diagnosis not present

## 2022-07-09 DIAGNOSIS — Z1331 Encounter for screening for depression: Secondary | ICD-10-CM | POA: Diagnosis not present

## 2022-07-09 DIAGNOSIS — K219 Gastro-esophageal reflux disease without esophagitis: Secondary | ICD-10-CM | POA: Diagnosis not present

## 2022-07-09 DIAGNOSIS — C61 Malignant neoplasm of prostate: Secondary | ICD-10-CM | POA: Diagnosis not present

## 2022-07-09 DIAGNOSIS — E559 Vitamin D deficiency, unspecified: Secondary | ICD-10-CM | POA: Diagnosis not present

## 2022-07-09 DIAGNOSIS — I10 Essential (primary) hypertension: Secondary | ICD-10-CM | POA: Diagnosis not present

## 2022-07-09 DIAGNOSIS — M8000XS Age-related osteoporosis with current pathological fracture, unspecified site, sequela: Secondary | ICD-10-CM | POA: Diagnosis not present

## 2022-07-09 DIAGNOSIS — R9389 Abnormal findings on diagnostic imaging of other specified body structures: Secondary | ICD-10-CM | POA: Diagnosis not present

## 2022-07-09 DIAGNOSIS — E7800 Pure hypercholesterolemia, unspecified: Secondary | ICD-10-CM | POA: Diagnosis not present

## 2022-07-09 DIAGNOSIS — R0602 Shortness of breath: Secondary | ICD-10-CM | POA: Diagnosis not present

## 2022-07-09 DIAGNOSIS — K5730 Diverticulosis of large intestine without perforation or abscess without bleeding: Secondary | ICD-10-CM | POA: Diagnosis not present

## 2022-07-10 ENCOUNTER — Other Ambulatory Visit: Payer: Self-pay | Admitting: Internal Medicine

## 2022-07-10 DIAGNOSIS — R9389 Abnormal findings on diagnostic imaging of other specified body structures: Secondary | ICD-10-CM

## 2022-07-10 DIAGNOSIS — I4891 Unspecified atrial fibrillation: Secondary | ICD-10-CM

## 2022-07-31 ENCOUNTER — Other Ambulatory Visit: Payer: Self-pay | Admitting: Internal Medicine

## 2022-07-31 ENCOUNTER — Ambulatory Visit
Admission: RE | Admit: 2022-07-31 | Discharge: 2022-07-31 | Disposition: A | Discharge status: Home / Self Care | Payer: Medicare Other | Source: Ambulatory Visit | Attending: Internal Medicine | Admitting: Internal Medicine

## 2022-07-31 DIAGNOSIS — M79644 Pain in right finger(s): Secondary | ICD-10-CM

## 2022-07-31 DIAGNOSIS — M19041 Primary osteoarthritis, right hand: Secondary | ICD-10-CM | POA: Diagnosis not present

## 2022-08-06 ENCOUNTER — Other Ambulatory Visit: Payer: Self-pay | Admitting: Cardiovascular Disease

## 2022-08-06 DIAGNOSIS — I4819 Other persistent atrial fibrillation: Secondary | ICD-10-CM

## 2022-08-07 ENCOUNTER — Ambulatory Visit (INDEPENDENT_AMBULATORY_CARE_PROVIDER_SITE_OTHER): Payer: Medicare Other

## 2022-08-07 DIAGNOSIS — I495 Sick sinus syndrome: Secondary | ICD-10-CM

## 2022-08-07 NOTE — Telephone Encounter (Signed)
Prescription refill request for Eliquis received. Indication:AFIB Last office visit:NEEDS APPT Scr:0.8  5/24 Age: 77 Weight:100.2  kg  Prescription refilled

## 2022-08-10 LAB — CUP PACEART REMOTE DEVICE CHECK
Battery Remaining Longevity: 70 mo
Battery Remaining Percentage: 51 %
Battery Voltage: 3.01 V
Brady Statistic RV Percent Paced: 4.7 %
Date Time Interrogation Session: 20240531235705
Implantable Lead Connection Status: 753985
Implantable Lead Connection Status: 753985
Implantable Lead Implant Date: 20181211
Implantable Lead Implant Date: 20181211
Implantable Lead Location: 753859
Implantable Lead Location: 753860
Implantable Pulse Generator Implant Date: 20181211
Lead Channel Impedance Value: 400 Ohm
Lead Channel Pacing Threshold Amplitude: 0.75 V
Lead Channel Pacing Threshold Pulse Width: 0.5 ms
Lead Channel Sensing Intrinsic Amplitude: 9 mV
Lead Channel Setting Pacing Amplitude: 2.5 V
Lead Channel Setting Pacing Pulse Width: 0.5 ms
Lead Channel Setting Sensing Sensitivity: 2 mV
Pulse Gen Model: 2272
Pulse Gen Serial Number: 8967264

## 2022-08-18 ENCOUNTER — Ambulatory Visit
Admission: RE | Admit: 2022-08-18 | Discharge: 2022-08-18 | Disposition: A | Discharge status: Home / Self Care | Payer: Medicare Other | Source: Ambulatory Visit | Attending: Internal Medicine | Admitting: Internal Medicine

## 2022-08-18 DIAGNOSIS — R918 Other nonspecific abnormal finding of lung field: Secondary | ICD-10-CM | POA: Diagnosis not present

## 2022-08-18 DIAGNOSIS — R0602 Shortness of breath: Secondary | ICD-10-CM | POA: Diagnosis not present

## 2022-08-18 DIAGNOSIS — K8020 Calculus of gallbladder without cholecystitis without obstruction: Secondary | ICD-10-CM | POA: Diagnosis not present

## 2022-08-18 DIAGNOSIS — I4891 Unspecified atrial fibrillation: Secondary | ICD-10-CM

## 2022-08-18 DIAGNOSIS — R9389 Abnormal findings on diagnostic imaging of other specified body structures: Secondary | ICD-10-CM

## 2022-08-18 DIAGNOSIS — J479 Bronchiectasis, uncomplicated: Secondary | ICD-10-CM | POA: Diagnosis not present

## 2022-08-23 ENCOUNTER — Emergency Department (HOSPITAL_BASED_OUTPATIENT_CLINIC_OR_DEPARTMENT_OTHER): Payer: Medicare Other | Admitting: Radiology

## 2022-08-23 ENCOUNTER — Emergency Department (HOSPITAL_BASED_OUTPATIENT_CLINIC_OR_DEPARTMENT_OTHER)
Admission: EM | Admit: 2022-08-23 | Discharge: 2022-08-23 | Disposition: A | Discharge status: Home / Self Care | Payer: Medicare Other | Attending: Emergency Medicine | Admitting: Emergency Medicine

## 2022-08-23 ENCOUNTER — Encounter (HOSPITAL_BASED_OUTPATIENT_CLINIC_OR_DEPARTMENT_OTHER): Payer: Self-pay

## 2022-08-23 DIAGNOSIS — R051 Acute cough: Secondary | ICD-10-CM | POA: Diagnosis not present

## 2022-08-23 DIAGNOSIS — Z950 Presence of cardiac pacemaker: Secondary | ICD-10-CM | POA: Diagnosis not present

## 2022-08-23 DIAGNOSIS — J471 Bronchiectasis with (acute) exacerbation: Secondary | ICD-10-CM | POA: Insufficient documentation

## 2022-08-23 DIAGNOSIS — R0602 Shortness of breath: Secondary | ICD-10-CM | POA: Diagnosis present

## 2022-08-23 DIAGNOSIS — E878 Other disorders of electrolyte and fluid balance, not elsewhere classified: Secondary | ICD-10-CM | POA: Insufficient documentation

## 2022-08-23 DIAGNOSIS — Z7901 Long term (current) use of anticoagulants: Secondary | ICD-10-CM | POA: Insufficient documentation

## 2022-08-23 DIAGNOSIS — J181 Lobar pneumonia, unspecified organism: Secondary | ICD-10-CM | POA: Insufficient documentation

## 2022-08-23 DIAGNOSIS — E871 Hypo-osmolality and hyponatremia: Secondary | ICD-10-CM | POA: Diagnosis not present

## 2022-08-23 DIAGNOSIS — J189 Pneumonia, unspecified organism: Secondary | ICD-10-CM

## 2022-08-23 LAB — CBC WITH DIFFERENTIAL/PLATELET
Abs Immature Granulocytes: 0.03 10*3/uL (ref 0.00–0.07)
Basophils Absolute: 0 10*3/uL (ref 0.0–0.1)
Basophils Relative: 0 %
Eosinophils Absolute: 0 10*3/uL (ref 0.0–0.5)
Eosinophils Relative: 0 %
HCT: 34.1 % — ABNORMAL LOW (ref 39.0–52.0)
Hemoglobin: 11.6 g/dL — ABNORMAL LOW (ref 13.0–17.0)
Immature Granulocytes: 0 %
Lymphocytes Relative: 4 %
Lymphs Abs: 0.4 10*3/uL — ABNORMAL LOW (ref 0.7–4.0)
MCH: 30.9 pg (ref 26.0–34.0)
MCHC: 34 g/dL (ref 30.0–36.0)
MCV: 90.7 fL (ref 80.0–100.0)
Monocytes Absolute: 0.6 10*3/uL (ref 0.1–1.0)
Monocytes Relative: 6 %
Neutro Abs: 8.6 10*3/uL — ABNORMAL HIGH (ref 1.7–7.7)
Neutrophils Relative %: 90 %
Platelets: 152 10*3/uL (ref 150–400)
RBC: 3.76 MIL/uL — ABNORMAL LOW (ref 4.22–5.81)
RDW: 13 % (ref 11.5–15.5)
WBC: 9.6 10*3/uL (ref 4.0–10.5)
nRBC: 0 % (ref 0.0–0.2)

## 2022-08-23 LAB — LACTIC ACID, PLASMA
Lactic Acid, Venous: 0.9 mmol/L (ref 0.5–1.9)
Lactic Acid, Venous: 1.3 mmol/L (ref 0.5–1.9)

## 2022-08-23 LAB — BASIC METABOLIC PANEL
Anion gap: 6 (ref 5–15)
BUN: 17 mg/dL (ref 8–23)
CO2: 28 mmol/L (ref 22–32)
Calcium: 10.5 mg/dL — ABNORMAL HIGH (ref 8.9–10.3)
Chloride: 97 mmol/L — ABNORMAL LOW (ref 98–111)
Creatinine, Ser: 0.74 mg/dL (ref 0.61–1.24)
GFR, Estimated: >60 mL/min (ref >60)
Glucose, Bld: 116 mg/dL — ABNORMAL HIGH (ref 70–99)
Potassium: 4.3 mmol/L (ref 3.5–5.1)
Sodium: 131 mmol/L — ABNORMAL LOW (ref 135–145)

## 2022-08-23 LAB — TROPONIN I (HIGH SENSITIVITY)
Troponin I (High Sensitivity): 28 ng/L — ABNORMAL HIGH (ref <18)
Troponin I (High Sensitivity): 26 ng/L — ABNORMAL HIGH (ref <18)

## 2022-08-23 LAB — BRAIN NATRIURETIC PEPTIDE: B Natriuretic Peptide: 156.5 pg/mL — ABNORMAL HIGH (ref 0.0–100.0)

## 2022-08-23 MED ORDER — ALBUTEROL SULFATE HFA 108 (90 BASE) MCG/ACT IN AERS: 1.0000 | INHALATION_SPRAY | Freq: Four times a day (QID) | RESPIRATORY_TRACT | 0 refills | Status: AC | PRN

## 2022-08-23 MED ORDER — MOXIFLOXACIN HCL 400 MG PO TABS: 400.0000 mg | ORAL_TABLET | Freq: Every day | ORAL | 0 refills | Status: AC

## 2022-08-23 MED ORDER — SODIUM CHLORIDE 0.9 % IV SOLN
500.0000 mg | Freq: Once | INTRAVENOUS | Status: AC
  Administered 2022-08-23: 500 mg via INTRAVENOUS
  Filled 2022-08-23: qty 5

## 2022-08-23 MED ORDER — LACTATED RINGERS IV BOLUS
1000.0000 mL | Freq: Once | INTRAVENOUS | Status: AC
  Administered 2022-08-23: 1000 mL via INTRAVENOUS

## 2022-08-23 MED ORDER — ACETAMINOPHEN 325 MG PO TABS
650.0000 mg | ORAL_TABLET | Freq: Once | ORAL | Status: AC
  Administered 2022-08-23: 650 mg via ORAL
  Filled 2022-08-23: qty 2

## 2022-08-23 MED ORDER — SODIUM CHLORIDE 0.9 % IV SOLN
1.0000 g | Freq: Once | INTRAVENOUS | Status: AC
  Administered 2022-08-23: 1 g via INTRAVENOUS
  Filled 2022-08-23: qty 10

## 2022-08-23 MED ORDER — PREDNISONE 10 MG PO TABS: 60.0000 mg | ORAL_TABLET | Freq: Every day | ORAL | 0 refills | Status: AC

## 2022-08-23 NOTE — Discharge Instructions (Addendum)
You have pneumonia but are not septic.  You have a history of bronchiectasis from your mass versus aspiration event during your hospitalization in 2022.  Recommend you follow-up outpatient with pulmonology regarding this.  Additionally we will treat you with antibiotics and steroids and an albuterol inhaler.  Considered and offered admission for observation which you have declined.  Will treat with a course of antibiotics.  Return for any severe worsening of symptoms otherwise follow-up with your PCP to ensure resolution.

## 2022-08-23 NOTE — ED Triage Notes (Signed)
Present for SOB.  Present to room in [facility].  Hx. CA  States has lung problems and last couple of days developed SOB.  Resp diminished at bases

## 2022-08-23 NOTE — ED Provider Notes (Addendum)
[place] EMERGENCY DEPARTMENT AT [facility] Provider Note   CSN: [id number] Arrival date & time: 08/23/22  1121     History  Chief Complaint  Patient presents with   Shortness of Breath    Todd Chapman is a 77 y.o. male.   Shortness of Breath Associated symptoms: cough      77 year old male with medical history significant for GERD, HLD, persistent atrial fibrillation, history of pneumonia, bradycardia, presence of permanent Saint Jude cardiac pacemaker who presents to the emergency department with pneumonia.  The patient states that he has had a week of cough.  He went to his PCP this morning for a checkup and was found to have a new consolidation in the right lung on chest x-ray read which may represent pneumonia.  He endorses a mild productive cough, denies any chest pain.  He is adamant that he does not want to come into the hospital.  He denies any fevers or chills.  Home Medications Prior to Admission medications   Medication Sig Start Date End Date Taking? Authorizing Provider  acetaminophen (TYLENOL) 325 MG tablet Take 2 tablets (650 mg total) by mouth every 6 (six) hours. 02/21/21   Setzer, Lynnell Jude, PA-C  albuterol (VENTOLIN HFA) 108 (90 Base) MCG/ACT inhaler Inhale 1-2 puffs into the lungs every 6 (six) hours as needed for wheezing or shortness of breath. 08/23/22  Yes Ernie Avena, MD  apixaban (ELIQUIS) 5 MG TABS tablet Take 1 tablet (5 mg total) by mouth 2 (two) times daily. Needs Cardiology Appt for Eliquis refills, call office 08/07/22   Sheilah Pigeon, PA-C  calcium carbonate (TUMS - DOSED IN MG ELEMENTAL CALCIUM) 500 MG chewable tablet Chew 2 tablets by mouth daily as needed for indigestion or heartburn.    [provider]  Cholecalciferol (VITAMIN D3) 125 MCG (5000 UT) CAPS 2 capsules    [provider]  HYDROcodone-acetaminophen (NORCO) 5-325 MG tablet Take 1 tablet by mouth every 6 (six) hours as needed for moderate  pain. Patient not taking: Reported on 06/24/2022 03/25/22   Lovorn, Aundra Millet, MD  lidocaine (LIDODERM) 5 % Place 2 patches onto the skin daily. 12 hrs on;12 hrs off- for R paraspinal/sciatic pain- Remove & Discard patch within 12 hours or as directed by MD 03/25/22   Genice Rouge, MD  Lidocaine 5 % CREA Apply 1 application. topically daily as needed (pain).    [provider]  Lidocaine 5 % CREA     [provider]  moxifloxacin (AVELOX) 400 MG tablet Take 1 tablet (400 mg total) by mouth daily at 8 pm for 10 days. 08/23/22 09/02/22 Yes Ernie Avena, MD  Multiple Vitamin (MULTIVITAMIN WITH MINERALS) TABS tablet Take 1 tablet by mouth daily.    [provider]  Multiple Vitamins-Minerals (PRESERVISION AREDS 2) CAPS 1 capsule    [provider]  olmesartan (BENICAR) 20 MG tablet Take 20 mg by mouth daily. 10/27/21   [provider]  pantoprazole (PROTONIX) 40 MG tablet Take 40 mg by mouth daily. 10/16/16   [provider]  Polyethyl Glycol-Propyl Glycol (SYSTANE) 0.4-0.3 % GEL ophthalmic gel See admin instructions.    [provider]  polyethylene glycol (MIRALAX / GLYCOLAX) 17 g packet Take 17 g by mouth daily. 02/21/21   Setzer, Lynnell Jude, PA-C  predniSONE (DELTASONE) 10 MG tablet Take 6 tablets (60 mg total) by mouth daily for 5 days. 08/23/22 08/28/22 Yes Ernie Avena, MD  Propylene Glycol (SYSTANE BALANCE OP) Place 1  drop into both eyes daily as needed (dry eyes).    [provider]  rosuvastatin (CRESTOR) 10 MG tablet Take 10 mg by mouth every morning.    [provider]  sodium chloride (OCEAN) 0.65 % SOLN nasal spray Place 1 spray into both nostrils at bedtime.    [provider]  trolamine salicylate (ASPERCREME) 10 % cream Apply 1 application  topically daily.    [provider]      Allergies    Atorvastatin, Ezetimibe, and Morphine and codeine    Review of Systems   Review of Systems   Respiratory:  Positive for cough and shortness of breath.   All other systems reviewed and are negative.   Physical Exam Updated Vital Signs BP 105/71   Pulse 76   Temp 98.6 F (37 C) (Oral)   Resp 18   Ht 6\' 2"  (1.88 m)   Wt 98 kg   SpO2 94%   BMI 27.73 kg/m  Physical Exam Pulmonary:     Breath sounds: Examination of the right-lower field reveals rhonchi. Rhonchi present.     ED Results / Procedures / Treatments   Labs (all labs ordered are listed, but only abnormal results are displayed) Labs Reviewed  BASIC METABOLIC PANEL - Abnormal; Notable for the following components:      Result Value   Sodium 131 (*)    Chloride 97 (*)    Glucose, Bld 116 (*)    Calcium 10.5 (*)    All other components within normal limits  CBC WITH DIFFERENTIAL/PLATELET - Abnormal; Notable for the following components:   RBC 3.76 (*)    Hemoglobin 11.6 (*)    HCT 34.1 (*)    Neutro Abs 8.6 (*)    Lymphs Abs 0.4 (*)    All other components within normal limits  BRAIN NATRIURETIC PEPTIDE - Abnormal; Notable for the following components:   B Natriuretic Peptide 156.5 (*)    All other components within normal limits  TROPONIN I (HIGH SENSITIVITY) - Abnormal; Notable for the following components:   Troponin I (High Sensitivity) 28 (*)    All other components within normal limits  TROPONIN I (HIGH SENSITIVITY) - Abnormal; Notable for the following components:   Troponin I (High Sensitivity) 26 (*)    All other components within normal limits  CULTURE, BLOOD (ROUTINE X 2)  CULTURE, BLOOD (ROUTINE X 2)  LACTIC ACID, PLASMA  LACTIC ACID, PLASMA    EKG None  Radiology No results found.  Procedures Procedures    Medications Ordered in ED Medications  acetaminophen (TYLENOL) tablet 650 mg (650 mg Oral Given 08/23/22 1248)  lactated ringers bolus 1,000 mL (0 mLs Intravenous Stopped 08/23/22 1409)  cefTRIAXone (ROCEPHIN) 1 g in sodium chloride 0.9 % 100 mL IVPB (0 g Intravenous Stopped  08/23/22 1327)  azithromycin (ZITHROMAX) 500 mg in sodium chloride 0.9 % 250 mL IVPB (0 mg Intravenous Stopped 08/23/22 1416)    ED Course/ Medical Decision Making/ A&P                             Medical Decision Making Amount and/or Complexity of Data Reviewed Labs: ordered.  Risk OTC drugs.     77 year old male with medical history significant for GERD, HLD, persistent atrial fibrillation, history of pneumonia, bradycardia, presence of permanent Saint Jude cardiac pacemaker who presents to the emergency department with pneumonia.  The patient states that he has had a  week of cough.  He went to his PCP this morning for a checkup and was found to have a new consolidation in the right lung on chest x-ray read which may represent pneumonia.  He endorses a mild productive cough, denies any chest pain.  He is adamant that he does not want to come into the hospital.  He denies any fevers or chills.  On arrival, patient was vitally stable, febrile temperature 100.7, not tachycardic or tachypneic, BP 131/69, saturating 95% on room air.  Atrial fibrillation that is rate controlled noted on cardiac telemetry.  Patient presenting with a cough for the past week, findings of pneumonia on chest x-ray.  IV access was obtained and the patient was administered an IV fluid bolus, covered with broad-spectrum antibiotics to include azithromycin and Rocephin.  He was administered Tylenol for fever control.  Laboratory evaluation significant for CBC without a leukocytosis, mild anemia to 11.6, troponins mildly elevated but flat at 28, downtrending to 26, BMP with mild hyponatremia 131, hypochloremia to 97, otherwise no significant abnormality, mildly elevated calcium at 10.5, normal renal function, BNP nonspecifically mildly elevated to 157, lactic acid normal at 1.3.  Blood cultures x 2 were collected.  Patient is overall well-appearing.  Of note, the patient has a longstanding history of bronchiectasis after a  massive aspiration event and pneumonia back in 2022 during the hospitalization.  He is previously followed outpatient with pulmonology for this.  I considered admission for observation versus discharge and outpatient management. His curb 65 score is a 1 indicating low risk group for 30-day mortality.  He is strongly requesting outpatient management. Not hypoxic, not septic, vitally stable and well appearing. Will discharge the patient on a course of Augmentin for further management.  I provided strict return precautions in the event of any severe worsening of symptoms.  Overall stable for discharge with plan for follow-up with his PCP.  Final Clinical Impression(s) / ED Diagnoses Final diagnoses:  Community acquired pneumonia of right lower lobe of lung  Bronchiectasis with acute exacerbation (HCC)    Rx / DC Orders ED Discharge Orders          Ordered    predniSONE (DELTASONE) 10 MG tablet  Daily        08/23/22 1536    moxifloxacin (AVELOX) 400 MG tablet  Daily        08/23/22 1536    albuterol (VENTOLIN HFA) 108 (90 Base) MCG/ACT inhaler  Every 6 hours PRN        08/23/22 1536                Ernie Avena, MD 08/23/22 1538

## 2022-08-24 LAB — CULTURE, BLOOD (ROUTINE X 2)

## 2022-08-25 LAB — CULTURE, BLOOD (ROUTINE X 2)

## 2022-08-25 NOTE — Progress Notes (Signed)
Remote pacemaker transmission.   

## 2022-08-26 LAB — CULTURE, BLOOD (ROUTINE X 2): Culture: NO GROWTH

## 2022-08-27 LAB — CULTURE, BLOOD (ROUTINE X 2): Special Requests: ADEQUATE

## 2022-08-28 LAB — CULTURE, BLOOD (ROUTINE X 2)
Culture: NO GROWTH
Special Requests: ADEQUATE

## 2022-09-08 ENCOUNTER — Other Ambulatory Visit: Payer: Self-pay | Admitting: Physician Assistant

## 2022-09-08 ENCOUNTER — Encounter: Payer: Self-pay | Admitting: *Deleted

## 2022-09-08 DIAGNOSIS — I4819 Other persistent atrial fibrillation: Secondary | ICD-10-CM

## 2022-09-08 NOTE — Telephone Encounter (Addendum)
Eliquis 5mg  refill request received. Patient is 77 years old, weight-98kg, Crea-0.74 on 08/23/22, Diagnosis-Afib, and last seen by Dr. Johney Frame on 06/20/21-pt needs an appt. Dose is appropriate based on dosing criteria.  Pt needs an appt with Cardiologist. Will send a message to Schedulers.    At 324pm, called pt since he needs an appointment. He stated he was in the middle of something and will call the Schedulers back and asked if I could send a MyChart to him to remind him to call the Schedulers.

## 2022-09-09 ENCOUNTER — Telehealth: Payer: Self-pay | Admitting: *Deleted

## 2022-09-09 DIAGNOSIS — I4819 Other persistent atrial fibrillation: Secondary | ICD-10-CM

## 2022-09-09 MED ORDER — APIXABAN 5 MG PO TABS
5.0000 mg | ORAL_TABLET | Freq: Two times a day (BID) | ORAL | 0 refills | Status: AC
Start: 2022-09-09 — End: ?

## 2022-09-09 NOTE — Telephone Encounter (Signed)
EP scheduler reached out to me after she spoke to pt to schedule his yearly follow up (former [facility] pt). Pt told her he was almost out of Eliquis w/ no more refills. Reviewed recent blood work. Called pt and informed will send in 90d Rx to pharmacy for him.  Patient verbalized understanding and agreeable to plan.  He appreciates the help with this.

## 2022-10-06 ENCOUNTER — Encounter: Payer: Self-pay | Admitting: Cardiology

## 2022-10-06 ENCOUNTER — Encounter: Payer: Self-pay | Admitting: Pulmonary Disease

## 2022-10-06 ENCOUNTER — Ambulatory Visit: Payer: Medicare Other | Attending: Cardiology | Admitting: Cardiology

## 2022-10-06 ENCOUNTER — Ambulatory Visit (INDEPENDENT_AMBULATORY_CARE_PROVIDER_SITE_OTHER): Payer: Medicare Other | Admitting: Pulmonary Disease

## 2022-10-06 VITALS — BP 130/80 | HR 60 | Ht 74.0 in | Wt 223.4 lb

## 2022-10-06 VITALS — BP 122/72 | HR 61 | Ht 74.0 in | Wt 221.6 lb

## 2022-10-06 DIAGNOSIS — I495 Sick sinus syndrome: Secondary | ICD-10-CM | POA: Diagnosis not present

## 2022-10-06 DIAGNOSIS — D6869 Other thrombophilia: Secondary | ICD-10-CM | POA: Diagnosis not present

## 2022-10-06 DIAGNOSIS — R0609 Other forms of dyspnea: Secondary | ICD-10-CM | POA: Diagnosis not present

## 2022-10-06 DIAGNOSIS — I4819 Other persistent atrial fibrillation: Secondary | ICD-10-CM | POA: Diagnosis not present

## 2022-10-06 LAB — CUP PACEART INCLINIC DEVICE CHECK
Battery Remaining Longevity: 73 mo
Battery Voltage: 3.01 V
Brady Statistic RA Percent Paced: 0 %
Brady Statistic RV Percent Paced: 4.2 %
Date Time Interrogation Session: 20240730165507
Implantable Lead Connection Status: 753985
Implantable Lead Connection Status: 753985
Implantable Lead Implant Date: 20181211
Implantable Lead Implant Date: 20181211
Implantable Lead Location: 753859
Implantable Lead Location: 753860
Implantable Pulse Generator Implant Date: 20181211
Lead Channel Impedance Value: 375 Ohm
Lead Channel Pacing Threshold Amplitude: 0.625 V
Lead Channel Pacing Threshold Amplitude: 1 V
Lead Channel Pacing Threshold Amplitude: 1 V
Lead Channel Pacing Threshold Pulse Width: 0.5 ms
Lead Channel Pacing Threshold Pulse Width: 0.5 ms
Lead Channel Pacing Threshold Pulse Width: 0.5 ms
Lead Channel Sensing Intrinsic Amplitude: 5 mV
Lead Channel Sensing Intrinsic Amplitude: 9.4 mV
Lead Channel Setting Pacing Amplitude: 2.5 V
Lead Channel Setting Pacing Pulse Width: 0.5 ms
Lead Channel Setting Sensing Sensitivity: 2 mV
Pulse Gen Model: 2272
Pulse Gen Serial Number: 8967264

## 2022-10-06 NOTE — Progress Notes (Signed)
@Patient  ID: Todd Chapman, male    DOB: [birth year], 77 y.o.   MRN: [id number]  Chief Complaint  Patient presents with   Hospitalization Follow-up    Pt states he is better, still sob Ct 6/11    Referring provider: No ref. provider found  HPI:   77 y.o. man whom we are seeing in follow up  for evaluation of abnormal chest imaging.  Chief complaint today is dyspnea on exertion and cough.  Most recent ED note reviewed.  Last seen 2022.  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  Has had worsening dyspnea exertion the last few months.  Had some fever and acute symptoms 08/2022 went to ED.  On my review interpretation CT chest is stable with stable nodular opacity without acute change.  He was diagnosed with pneumonia and put on Augmentin.  Went to PCP afterwards and got a second antibiotic.  Not sure what the name of this is.  Gradually improved.  He does have residual dyspnea.  But this preceded this acute illness he reports.  Large portion of time discussing additional etiologies of symptoms and treatment.  Discussed his healthcare values which appears more holistic or naturopathic in nature.  We discussed my limitations and suggestions as well as knowledge of risk and benefits of these interventions.   HPI at initial visit: Patient was admitted 06/01/2020 in setting of abdominal pain and nausea and vomiting.  Has been having chronic diarrhea but the prior 24 hours he had severe constipation.  Had projectile vomiting.  Came to the ED.  CT scan showed high-grade small bowel obstruction.  The portion of the right lower lobe that was seen on the CT scan on my interpretation reveals diffuse nodular and groundglass infiltrates consistent with pneumonia, notably left lower lobe is totally clear.  He was treated with Unasyn for aspiration pneumonia.  Went to the OR for lysis of adhesions for SBO.  He improved and was discharged on  06/06/2020.  Serial chest x-rays including 3/29, 4/11, and 6/7 were reviewed and on my interpretation reveal slowly and mildly improving right lower lobe infiltrate that persists over time.  Returns he feels, he rates himself a 7 out of 10.  He notes that he finished radiation for prostate cancer early March 2022.  He feels like he still recovering from that.  Said it wiped him out, but he felt quite fatigued.  Less active.  Reports he was hospitalized March 2022 and still feel like he is recovering from that.  Has some dyspnea on exertion.  However he does note that he does 20 minutes or so on elliptical routinely.  Can push up to 40 minutes but feels worn out, legs tired.  He occasionally every week or 2 coughs up some orange appearing phlegm from his lungs.  He has a history of reflux.  Treated pantoprazole.  Does note some hoarse voice occasionally in the morning but slowly clears over the course of the day.  Feels like there is phlegm there but nothing comes out.  He has mild nasal congestion that he treats with saline rinses.  Denies sensation of postnasal drip.  PMH: Hyperlipidemia, GERD, A. fib on Eliquis Surgical history: Appendectomy, lysis of adhesions pacemaker placement Family history: Mother with CVA, CAD, father with CAD Social history: Minimal to no smoking history, lives in [place] / Pulmonary Flowsheets:   ACT:      No data to display  MMRC:     No data to display          Epworth:      No data to display          Tests:   FENO:  No results found for: "NITRICOXIDE"  PFT:    Latest Ref Rng & Units 11/26/2016    8:36 AM  PFT Results  FVC-Pre L 4.80   FVC-Predicted Pre % 96   FVC-Post L 4.69   FVC-Predicted Post % 94   Pre FEV1/FVC % % 75   Post FEV1/FCV % % 82   FEV1-Pre L 3.59   FEV1-Predicted Pre % 98   FEV1-Post L 3.84   DLCO uncorrected ml/min/mmHg 30.77   DLCO UNC% % 81   DLVA Predicted % 87   TLC L 8.37   TLC %  Predicted % 106   RV % Predicted % 118   Personally reviewed and interpreted as normal spirometry, normal lung volumes, DLCO within normal limits  WALK:      No data to display          Imaging: Personally reviewed and as per EMR discussion of this note  Lab Results: Personally reviewed CBC    Component Value Date/Time   WBC 9.6 08/23/2022 1238   RBC 3.76 (L) 08/23/2022 1238   HGB 11.6 (L) 08/23/2022 1238   HGB 12.9 (L) 02/08/2017 1417   HCT 34.1 (L) 08/23/2022 1238   HCT 38.1 02/08/2017 1417   PLT 152 08/23/2022 1238   PLT 168 02/08/2017 1417   MCV 90.7 08/23/2022 1238   MCV 89 02/08/2017 1417   MCH 30.9 08/23/2022 1238   MCHC 34.0 08/23/2022 1238   RDW 13.0 08/23/2022 1238   RDW 13.0 02/08/2017 1417   LYMPHSABS 0.4 (L) 08/23/2022 1238   LYMPHSABS 1.5 02/08/2017 1417   MONOABS 0.6 08/23/2022 1238   EOSABS 0.0 08/23/2022 1238   EOSABS 0.1 02/08/2017 1417   BASOSABS 0.0 08/23/2022 1238   BASOSABS 0.0 02/08/2017 1417    BMET    Component Value Date/Time   NA 131 (L) 08/23/2022 1301   NA 141 02/08/2017 1417   K 4.3 08/23/2022 1301   CL 97 (L) 08/23/2022 1301   CO2 28 08/23/2022 1301   GLUCOSE 116 (H) 08/23/2022 1301   BUN 17 08/23/2022 1301   BUN 14 02/08/2017 1417   CREATININE 0.74 08/23/2022 1301   CALCIUM 10.5 (H) 08/23/2022 1301   CALCIUM 9.1 02/12/2021 0213   GFRNONAA >60 08/23/2022 1301   GFRAA >60 02/17/2017 0517    BNP    Component Value Date/Time   BNP 156.5 (H) 08/23/2022 1238    ProBNP No results found for: "PROBNP"  Specialty Problems       Pulmonary Problems   Acute upper respiratory infection   Bronchitis   Acute respiratory failure with hypoxia (HCC)   Multifocal pneumonia    Allergies  Allergen Reactions   Atorvastatin Palpitations   Ezetimibe Palpitations   Morphine And Codeine Other (See Comments)    Pt has hypersensitivity to morphine- makes confused after prolonged usage    Immunization History  Administered  Date(s) Administered   COVID-19, mRNA, vaccine(Comirnaty)12 years and older 12/26/2021   PFIZER(Purple Top)SARS-COV-2 Vaccination 04/13/2019, 05/08/2019   Respiratory Syncytial Virus Vaccine,Recomb Aduvanted(Arexvy) 02/09/2022    Past Medical History:  Diagnosis Date   Arthritis    "thumbs, back" (02/16/2017)   Bradycardia 02/2017   Dyspnea    with exertion   Flu 2 weeks  ago   GERD (gastroesophageal reflux disease)    Hepatitis A as child   HLD (hyperlipidemia)    Persistent atrial fibrillation (HCC) 12/18/2016   Pneumonia 1999; 2018   Presence of permanent cardiac pacemaker    [facility] jud[facility]   Prostate cancer (HCC)     Tobacco History: Social History   Tobacco Use  Smoking Status Former   Current packs/day: 0.00   Average packs/day: 0.1 packs/day for 8.0 years (1.0 ttl pk-yrs)   Types: Cigarettes   Start date: 11/24/1957   Quit date: 11/24/1965   Years since quitting: 56.9  Smokeless Tobacco Never   Counseling given: Not Answered    Continue to not smoke  Outpatient Encounter Medications as of 10/06/2022  Medication Sig   acetaminophen (TYLENOL) 325 MG tablet Take 2 tablets (650 mg total) by mouth every 6 (six) hours.   albuterol (VENTOLIN HFA) 108 (90 Base) MCG/ACT inhaler Inhale 1-2 puffs into the lungs every 6 (six) hours as needed for wheezing or shortness of breath.   apixaban (ELIQUIS) 5 MG TABS tablet Take 1 tablet (5 mg total) by mouth 2 (two) times daily. Needs Cardiology Appt for Eliquis refills, call office   calcium carbonate (TUMS - DOSED IN MG ELEMENTAL CALCIUM) 500 MG chewable tablet Chew 2 tablets by mouth daily as needed for indigestion or heartburn.   Cholecalciferol (VITAMIN D3) 125 MCG (5000 UT) CAPS 2 capsules   lidocaine (LIDODERM) 5 % Place 2 patches onto the skin daily. 12 hrs on;12 hrs off- for R paraspinal/sciatic pain- Remove & Discard patch within 12 hours or as directed by MD   Lidocaine 5 % CREA    Multiple Vitamin (MULTIVITAMIN WITH MINERALS)  TABS tablet Take 1 tablet by mouth daily.   Multiple Vitamins-Minerals (PRESERVISION AREDS 2) CAPS 1 capsule   olmesartan (BENICAR) 20 MG tablet Take 20 mg by mouth daily.   pantoprazole (PROTONIX) 40 MG tablet Take 40 mg by mouth daily.   Polyethyl Glycol-Propyl Glycol (SYSTANE) 0.4-0.3 % GEL ophthalmic gel See admin instructions.   Propylene Glycol (SYSTANE BALANCE OP) Place 1 drop into both eyes daily as needed (dry eyes).   rosuvastatin (CRESTOR) 10 MG tablet Take 10 mg by mouth every morning.   sodium chloride (OCEAN) 0.65 % SOLN nasal spray Place 1 spray into both nostrils at bedtime.   trolamine salicylate (ASPERCREME) 10 % cream Apply 1 application  topically daily.   HYDROcodone-acetaminophen (NORCO) 5-325 MG tablet Take 1 tablet by mouth every 6 (six) hours as needed for moderate pain. (Patient not taking: Reported on 06/24/2022)   polyethylene glycol (MIRALAX / GLYCOLAX) 17 g packet Take 17 g by mouth daily. (Patient not taking: Reported on 10/06/2022)   [DISCONTINUED] Lidocaine 5 % CREA Apply 1 application. topically daily as needed (pain).   No facility-administered encounter medications on file as of 10/06/2022.     Review of Systems  Review of Systems  N/a Physical Exam  BP 122/72   Pulse 61   Ht 6\' 2"  (1.88 m)   Wt 221 lb 9.6 oz (100.5 kg)   SpO2 95%   BMI 28.45 kg/m   Wt Readings from Last 5 Encounters:  10/06/22 221 lb 9.6 oz (100.5 kg)  08/23/22 216 lb (98 kg)  06/24/22 220 lb 12.8 oz (100.2 kg)  03/25/22 216 lb (98 kg)  12/10/21 216 lb 9.6 oz (98.2 kg)    BMI Readings from Last 5 Encounters:  10/06/22 28.45 kg/m  08/23/22 27.73 kg/m  06/24/22 28.35 kg/m  03/25/22 27.73 kg/m  12/10/21 27.81 kg/m     Physical Exam General: Well-appearing, no distress Eyes: EOMI, no icterus Neck: Supple, no JVD Pulmonary: Clear, normal work of breathing Cardiovascular: Irregularly irregular, regular rate Abdomen: Nondistended, bowel sounds present MSK: No  synovitis, no joint effusion Neuro: Normal gait, no weakness Psych: Normal, full affect   Assessment & Plan:   Improving right lung infiltrate: initially seen CT scan 05/2020 in the setting of clinical aspiration with his SBO and vomiting.  To my eye serial improvement but persistence of right lung infiltrate on subsequent chest x-rays in the interim.  Suspect related to fibrosis from infection, aspiration.  Repeat CT chest 10/18/2020 with improved overall right lower lobe with newly discovered nodule scattered throughout the right most notably right middle lobe.  The appearance is atypical for malignancy.  Interval follow-up scan 12/2020 with further improvement of right lower lobe infiltrates as well as improvement in size of nodular right middle lobe infiltrates.  High suspicion are related to massive aspiration event during hospitalization 05/2020.  No need for further evaluation.  Dyspnea on exertion: Suspect related deconditioning after radiation and hospitalization in March 2022.  Was feeling better after aspiration hospitalization later in 2022.  With the return of symptoms.  Sounds like bronchitis or asthma is possible.  He wishes to avoid additional pharmaceuticals at this time.  Recent CT of the chest is stable.  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  Discussed or encouraged asking his cardiologist if his heart rate was able to increase with exercise.  He would like to seek holistic or naturopathic approach.  Unfortunately I do not have expertise in this but encouraged him to seek out other opinions from other providers.   Return if symptoms worsen or fail to improve.   Karren Burly, MD 10/06/2022   I spent 43 minutes in care of the patient this encounter including face-to-face visit, review of records, coordination of care.

## 2022-10-06 NOTE — Patient Instructions (Addendum)
I wonder about bronchitis - The CT scan looked stable while in the ED, not totally convinced about pneumonia.    Holistic or naturopathic practices nearby:  [facility] Address: [address] [place], [place] [address] Phone: [phone]  [facility] Address: [facility] [address], [place], [place] [address] Phone: [phone]  Search "primary care DO" or "Primary care doctor of osteopathy"   Ask the cardiologist if your heart rate is able to get high enough to exercise based to the pace maker data  Return to clinic as needed

## 2022-10-06 NOTE — Progress Notes (Signed)
  Electrophysiology Office Note:   Date:  10/06/2022  ID:  Todd Chapman, DOB 12-17-1945, MRN [id number]  Primary Cardiologist: None Electrophysiologist: Averie Meiner Jorja Loa, MD      History of Present Illness:   Todd Chapman is a 77 y.o. male with h/o atrial fibrillation, sick sinus syndrome seen today for routine electrophysiology followup.  Since last being seen in our clinic the patient reports having more shortness of breath.  He continues to be able to exert himself, but he has shortness of breath both at rest and with exertion.  He has no PND or orthopnea.  He states that he used to be quite active, but has been having trouble with activity.  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  He was recently evaluated by pulmonary who did not feel that his shortness of breath was respiratory in nature.  he denies chest pain, palpitations, dyspnea, PND, orthopnea, nausea, vomiting, dizziness, syncope, edema, weight gain, or early satiety.   Review of systems complete and found to be negative unless listed in HPI.      EP Information / Studies Reviewed:    EKG is not ordered today. EKG from 08/23/22 reviewed which showed atrial flutter, rate 77, PVCs      PPM Interrogation-  reviewed in detail today,  See PACEART report.  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 The patient's score is based upon: CHF History: 0 HTN History: 0 Diabetes History: 0 Stroke History: 0 Vascular Disease History: 0 Age Score: 2 Gender Score: 0             Physical Exam:   VS:  BP 130/80 (BP Location: Left Arm, Patient Position: Sitting, Cuff Size: Large)   Pulse 60   Ht 6\' 2"  (1.88 m)   Wt 223 lb 6.4 oz (101.3 kg)   SpO2 96%   BMI 28.68 kg/m    Wt Readings from Last 3 Encounters:  10/06/22 223 lb 6.4 oz (101.3 kg)   10/06/22 221 lb 9.6 oz (100.5 kg)  08/23/22 216 lb (98 kg)     GEN: Well nourished, well developed in no acute distress NECK: No JVD; No carotid bruits CARDIAC: Irregularly irregular rate and rhythm, no murmurs, rubs, gallops RESPIRATORY:  Clear to auscultation without rales, wheezing or rhonchi  ABDOMEN: Soft, non-tender, non-distended EXTREMITIES:  No edema; No deformity   ASSESSMENT AND PLAN:    1.  SND s/p Abbott PPM  Normal PPM function See Arita Miss Art report He has been short of breath.  His histograms are mildly blunted.  Todd Chapman turn on rate response.  Todd Chapman also repeat the transthoracic echo to ensure that his ejection fraction remains normal.  2.  Permanent atrial fibrillation: Currently on Eliquis.  Well rate controlled.  No changes.  3.  Secondary hypercoagulable state: Currently on Eliquis for atrial fibrillation  Disposition:   Follow up with Dr. Elberta Fortis in 12 months  Signed, Garner Dullea Jorja Loa, MD

## 2022-10-06 NOTE — Patient Instructions (Signed)
Medication Instructions:  Your physician recommends that you continue on your current medications as directed. Please refer to the Current Medication list given to you today.  *If you need a refill on your cardiac medications before your next appointment, please call your pharmacy*   Lab Work: None ordered   Testing/Procedures: Your physician has requested that you have an echocardiogram. Echocardiography is a painless test that uses sound waves to create images of your heart. It provides your doctor with information about the size and shape of your heart and how well your heart's chambers and valves are working. This procedure takes approximately one hour. There are no restrictions for this procedure. Please do NOT wear cologne, perfume, aftershave, or lotions (deodorant is allowed). Please arrive 15 minutes prior to your appointment time.   Follow-Up: At [facility], you and your health needs are our priority.  As part of our continuing mission to provide you with exceptional heart care, we have created designated Provider Care Teams.  These Care Teams include your primary Cardiologist (physician) and Advanced Practice Providers (APPs -  Physician Assistants and Nurse Practitioners) who all work together to provide you with the care you need, when you need it.  Remote monitoring is used to monitor your Pacemaker or ICD from home. This monitoring reduces the number of office visits required to check your device to one time per year. It allows [place] to keep an eye on the functioning of your device to ensure it is working properly. You are scheduled for a device check from home on 11/06/22. You may send your transmission at any time that day. If you have a wireless device, the transmission will be sent automatically. After your physician reviews your transmission, you will receive a postcard with your next transmission date.  Your next appointment:   1 year(s)  The format for your next  appointment:   In Person  Provider:   You will see one of the following Advanced Practice Providers on your designated Care Team:   Francis Dowse, [place] "Mardelle Matte" [place], [place] Canary Brim, NP   Thank you for choosing [facility]!!   Dory Horn, RN [phone]

## 2022-10-19 DIAGNOSIS — D225 Melanocytic nevi of trunk: Secondary | ICD-10-CM | POA: Diagnosis not present

## 2022-10-19 DIAGNOSIS — L821 Other seborrheic keratosis: Secondary | ICD-10-CM | POA: Diagnosis not present

## 2022-10-19 DIAGNOSIS — D485 Neoplasm of uncertain behavior of skin: Secondary | ICD-10-CM | POA: Diagnosis not present

## 2022-10-19 DIAGNOSIS — L820 Inflamed seborrheic keratosis: Secondary | ICD-10-CM | POA: Diagnosis not present

## 2022-10-19 DIAGNOSIS — L570 Actinic keratosis: Secondary | ICD-10-CM | POA: Diagnosis not present

## 2022-10-28 DIAGNOSIS — M1811 Unilateral primary osteoarthritis of first carpometacarpal joint, right hand: Secondary | ICD-10-CM | POA: Diagnosis not present

## 2022-11-05 ENCOUNTER — Ambulatory Visit (HOSPITAL_COMMUNITY): Payer: Medicare Other | Attending: Cardiology

## 2022-11-05 DIAGNOSIS — I4819 Other persistent atrial fibrillation: Secondary | ICD-10-CM

## 2022-11-05 DIAGNOSIS — I495 Sick sinus syndrome: Secondary | ICD-10-CM | POA: Diagnosis not present

## 2022-11-05 LAB — ECHOCARDIOGRAM COMPLETE
Area-P 1/2: 2.71 cm2
S' Lateral: 3.1 cm

## 2022-11-06 ENCOUNTER — Ambulatory Visit (INDEPENDENT_AMBULATORY_CARE_PROVIDER_SITE_OTHER): Payer: Medicare Other

## 2022-11-06 DIAGNOSIS — I495 Sick sinus syndrome: Secondary | ICD-10-CM | POA: Diagnosis not present

## 2022-11-06 LAB — CUP PACEART REMOTE DEVICE CHECK
Battery Remaining Longevity: 68 mo
Battery Remaining Percentage: 50 %
Battery Voltage: 3.01 V
Brady Statistic RV Percent Paced: 11 %
Date Time Interrogation Session: 20240830023817
Implantable Lead Connection Status: 753985
Implantable Lead Connection Status: 753985
Implantable Lead Implant Date: 20181211
Implantable Lead Implant Date: 20181211
Implantable Lead Location: 753859
Implantable Lead Location: 753860
Implantable Pulse Generator Implant Date: 20181211
Lead Channel Impedance Value: 430 Ohm
Lead Channel Pacing Threshold Amplitude: 1 V
Lead Channel Pacing Threshold Pulse Width: 0.5 ms
Lead Channel Sensing Intrinsic Amplitude: 9.3 mV
Lead Channel Setting Pacing Amplitude: 2.5 V
Lead Channel Setting Pacing Pulse Width: 0.5 ms
Lead Channel Setting Sensing Sensitivity: 2 mV
Pulse Gen Model: 2272
Pulse Gen Serial Number: 8967264

## 2022-11-10 NOTE — Progress Notes (Signed)
Remote pacemaker transmission.   

## 2022-11-20 ENCOUNTER — Other Ambulatory Visit (HOSPITAL_BASED_OUTPATIENT_CLINIC_OR_DEPARTMENT_OTHER): Payer: Self-pay

## 2022-11-20 DIAGNOSIS — Z23 Encounter for immunization: Secondary | ICD-10-CM | POA: Diagnosis not present

## 2022-11-20 MED ORDER — COMIRNATY 30 MCG/0.3ML IM SUSY
0.3000 mL | PREFILLED_SYRINGE | Freq: Once | INTRAMUSCULAR | 0 refills | Status: AC
  Filled 2022-11-20: qty 0.3, 1d supply, fill #0

## 2022-11-20 MED ORDER — FLUAD 0.5 ML IM SUSY
0.5000 mL | PREFILLED_SYRINGE | Freq: Once | INTRAMUSCULAR | 0 refills | Status: AC
  Filled 2022-11-20: qty 0.5, 1d supply, fill #0

## 2022-12-06 ENCOUNTER — Other Ambulatory Visit: Payer: Self-pay | Admitting: Cardiology

## 2022-12-06 DIAGNOSIS — I4819 Other persistent atrial fibrillation: Secondary | ICD-10-CM

## 2022-12-07 NOTE — Telephone Encounter (Signed)
Prescription refill request for Eliquis received. Indication:afib Last office visit:7/24 Scr:0.74  6/24 Age: 77 Weight:101.3  kg  Prescription refilled

## 2022-12-23 ENCOUNTER — Encounter
Payer: Medicare Other | Attending: Physical Medicine and Rehabilitation | Admitting: Physical Medicine and Rehabilitation

## 2022-12-23 ENCOUNTER — Encounter: Payer: Self-pay | Admitting: Physical Medicine and Rehabilitation

## 2022-12-23 VITALS — BP 127/81 | HR 68 | Ht 74.0 in | Wt 219.0 lb

## 2022-12-23 DIAGNOSIS — G8929 Other chronic pain: Secondary | ICD-10-CM | POA: Diagnosis not present

## 2022-12-23 DIAGNOSIS — M25562 Pain in left knee: Secondary | ICD-10-CM | POA: Insufficient documentation

## 2022-12-23 DIAGNOSIS — S32050G Wedge compression fracture of fifth lumbar vertebra, subsequent encounter for fracture with delayed healing: Secondary | ICD-10-CM | POA: Insufficient documentation

## 2022-12-23 DIAGNOSIS — G894 Chronic pain syndrome: Secondary | ICD-10-CM | POA: Insufficient documentation

## 2022-12-23 DIAGNOSIS — M25561 Pain in right knee: Secondary | ICD-10-CM | POA: Insufficient documentation

## 2022-12-23 DIAGNOSIS — M5416 Radiculopathy, lumbar region: Secondary | ICD-10-CM | POA: Diagnosis not present

## 2022-12-23 MED ORDER — DULOXETINE HCL 30 MG PO CPEP: 30.0000 mg | ORAL_CAPSULE | Freq: Every day | ORAL | 0 refills | Status: DC

## 2022-12-23 MED ORDER — DULOXETINE HCL 60 MG PO CPEP
60.0000 mg | ORAL_CAPSULE | Freq: Every day | ORAL | 5 refills | Status: DC
Start: 2022-12-23 — End: 2023-11-05

## 2022-12-23 NOTE — Patient Instructions (Signed)
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  Stopped Chemo/etc.   Went over Cymbalta/Duloxetine.    Duloxetine /Cymbalta 30 mg nightly x 1 week  Then 60 mg nightly- for nerve pain  1-3% of patients can have nausea with Duloxetine- call me if needs an anti-nausea medicine. Can also cause mild dry mouth/dry eyes and mild constipation. Should be OK with Eliquis- even though rarely can make blood slightly thinner. But I have patients on both and do well.   2.   Seeing Ortho for B/L knee/back pain. Has appt Monday- so will let them address.    3. We discussed, doesn't want Opiates- which is very reasonable.   4.  [facility] might give you higher dose Lidocaine cream- can try compounded- if cannot get ahold of it. I can write for it (compounded lidocaine)  I use [facility]- [place].    5.   F/U in 3 months-

## 2022-12-23 NOTE — Progress Notes (Signed)
Subjective:    Patient ID: Todd Chapman, male    DOB: [birth year], 77 y.o.   MRN: [id number]  HPI  Pt is a 77 yr old male with hx of chronic back pain with L5 vertebral body compression fx s/p kyphoplasty, hx of prostate CA- no mets; cancer related fatigue, HTN; hyponatremia in hospital;  Here for  f/u on kyphoplasty and chronic pain due to compression fx at L5.  Stopped Chemo/etc.  Doing OK-  Hurts all the time Stretches- takes tlyneol Does walking regularly,   Seeing Dr Penni Bombard- [facility]- to manage pain.   Still having difficulty with SOB-  Seeing [facility]- check with pacemaker Cards- increased heart rate - reacted favorably, but still SOB/DOE.   Did an U/S for chest- and said everything fine.  Next step is   Below the waist- back and knee pain issues Above waist- heart and lungs issues. Plans- -wha'ts next".   Not taking any more opiates.    Sees someone for massage.   B/L knee pain getting worse lately- seeing Ortho Monday    Lidoderm patches 5% helpful for pain. - doesn't have ot use cream as much.   Gets Lidocaine 5% on amaz[employer].    Pain Inventory Average Pain 6 Pain Right Now 4 My pain is sharp, burning, dull, and aching  In the last 24 hours, has pain interfered with the following? General activity 6 Relation with others 3 Enjoyment of life 3 What TIME of day is your pain at its worst? evening and night Sleep (in general) Fair  Pain is worse with: walking, bending, sitting, inactivity, and standing Pain improves with: rest, heat/ice, and medication Relief from Meds: 0  Family History  Problem Relation Age of Onset   CVA Mother    Coronary artery disease Mother    Stroke Mother    Coronary artery disease Father    Heart attack Father    Stomach cancer Maternal Grandmother    Breast cancer Neg Hx    Prostate cancer Neg Hx    Colon cancer Neg Hx    Pancreatic cancer Neg Hx    Social History   Socioeconomic History   Marital  status: Married    Spouse name: Not on file   Number of children: 2   Years of education: Not on file   Highest education level: Not on file  Occupational History   Not on file  Tobacco Use   Smoking status: Former    Current packs/day: 0.00    Average packs/day: 0.1 packs/day for 8.0 years (1.0 ttl pk-yrs)    Types: Cigarettes    Start date: 11/24/1957    Quit date: 11/24/1965    Years since quitting: 57.1   Smokeless tobacco: Never  Vaping Use   Vaping status: Never Used  Substance and Sexual Activity   Alcohol use: Yes    Comment: 02/16/2017 "might have 1 drink/month"   Drug use: No   Sexual activity: Yes  Other Topics Concern   Not on file  Social History Narrative   He is a judge   Social Determinants of Health   Financial Resource Strain: Not on file  Food Insecurity: Not on file  Transportation Needs: Not on file  Physical Activity: Not on file  Stress: Not on file  Social Connections: Not on file   Past Surgical History:  Procedure Laterality Date   APPENDECTOMY     age 33   CARDIOVERSION  02/16/2017   CARDIOVERSION N/A  02/16/2017   Procedure: CARDIOVERSION;  Surgeon: Hillis Range, MD;  [facility];  Service: Cardiovascular;  Laterality: N/A;   COLONOSCOPY WITH PROPOFOL N/A 12/13/2012   Procedure: COLONOSCOPY WITH PROPOFOL;  Surgeon: Charolett Bumpers, MD;  Location: [facility];  Service: Endoscopy;  Laterality: N/A;   FRACTURE SURGERY     HYDROCELE EXCISION Left 05/16/2018   Procedure: HYDROCELECTOMY ADULT;  Surgeon: Marcine Matar, MD;  Location: [facility];  Service: Urology;  Laterality: Left;   INSERT / REPLACE / REMOVE PACEMAKER  02/16/2017   KYPHOPLASTY N/A 02/10/2021   Procedure: KYPHOPLASTY L-5;  Surgeon: Tressie Stalker, MD;  [facility];  Service: Neurosurgery;  Laterality: N/A;   LAPAROSCOPY N/A 06/04/2020   Procedure: POSSIBLE LAPAROTOMY POSSIBLE BOWEL RESSECTION;  Surgeon: Berna Bue, MD;  Location:  [facility];  Service: General;  Laterality: N/A;   PACEMAKER IMPLANT N/A 02/16/2017   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 by Dr Johney Frame   PROSTATE BIOPSY  11/23/2019   WRIST FRACTURE SURGERY Left    with pins   Past Surgical History:  Procedure Laterality Date   APPENDECTOMY     age 53   CARDIOVERSION  02/16/2017   CARDIOVERSION N/A 02/16/2017   Procedure: CARDIOVERSION;  Surgeon: Hillis Range, MD;  [facility];  Service: Cardiovascular;  Laterality: N/A;   COLONOSCOPY WITH PROPOFOL N/A 12/13/2012   Procedure: COLONOSCOPY WITH PROPOFOL;  Surgeon: Charolett Bumpers, MD;  Location[facility];  Service: Endoscopy;  Laterality: N/A;   FRACTURE SURGERY     HYDROCELE EXCISION Left 05/16/2018   Procedure: HYDROCELECTOMY ADULT;  Surgeon: Marcine Matar, MD;  Location: [facility];  Service: Urology;  Laterality: Left;   INSERT / REPLACE / REMOVE PACEMAKER  02/16/2017   KYPHOPLASTY N/A 02/10/2021   Procedure: KYPHOPLASTY L-5;  Surgeon: Tressie Stalker, MD;  Location: [facility] OR;  Service: Neurosurgery;  Laterality: N/A;   LAPAROSCOPY N/A 06/04/2020   Procedure: POSSIBLE LAPAROTOMY POSSIBLE BOWEL RESSECTION;  Surgeon: Berna Bue, MD;  Location: [facility];  Service: General;  Laterality: N/A;   PACEMAKER IMPLANT N/A 02/16/2017   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 by Dr Johney Frame   PROSTATE BIOPSY  11/23/2019   WRIST FRACTURE SURGERY Left    with pins   Past Medical History:  Diagnosis Date   Arthritis    "thumbs, back" (02/16/2017)   Bradycardia 02/2017   Dyspnea    with exertion   Flu 2 weeks ago   GERD (gastroesophageal reflux disease)    Hepatitis A as child   HLD (hyperlipidemia)    Persistent atrial fibrillation (HCC) 12/18/2016   Pneumonia 1999; 2018   Presence of permanent cardiac pacemaker    st jude   Prostate cancer (HCC)     There were no vitals taken for this visit.  Opioid Risk Score:   Fall Risk Score:  `1  Depression screen [facility] 2/9     06/24/2022   11:02 AM 03/25/2022   11:44 AM 05/02/2021    2:41 PM  Depression screen PHQ 2/9  Decreased Interest 0 0 0  Down, Depressed, Hopeless 0 0 0  PHQ - 2 Score 0 0 0  Altered sleeping   3  Tired, decreased energy   2  Change in appetite   0  Feeling bad or failure about yourself    0  Trouble concentrating   0  Moving slowly or fidgety/restless   0  Suicidal thoughts   0  PHQ-9 Score   5  Difficult doing work/chores   Somewhat difficult      Review of Systems  Musculoskeletal:  Positive for back pain and gait problem.       B/L knee pain   All other systems reviewed and are negative.     Objective:   Physical Exam        Assessment & Plan:   Pt is a 76 yr old male with hx of chronic back pain with L5 vertebral body compression fx s/p kyphoplasty, hx of prostate CA- no mets; cancer related fatigue, HTN; hyponatremia in hospital;  Here for  f/u on kyphoplasty and chronic pain due to compression fx at L5.  Stopped Chemo/etc.   Went over Cymbalta/Duloxetine.    Duloxetine /Cymbalta 30 mg nightly x 1 week  Then 60 mg nightly- for nerve pain  1-3% of patients can have nausea with Duloxetine- call me if needs an anti-nausea medicine. Can also cause mild dry mouth/dry eyes and mild constipation. Should be OK with Eliquis- even though rarely can make blood slightly thinner. But I have patients on both and do well.   2.   Seeing Ortho for B/L knee/back pain. Has appt Monday- so will let them address.    3. We discussed, doesn't want Opiates- which is very reasonable.   4.  [facility] might give you higher dose Lidocaine cream- can try compounded- if cannot get ahold of it. I can write for it (compounded lidocaine)  I use [facility]- [place].    5.   F/U in 3 months-     I spent a total of 24   minutes on total care today- >50%  coordination of care- due to d/w pt about options for pain- since not doing Opiates and cannot use NSAIDs- decided ot try Duloxetine- also discussed how to get higher dose of lidocaine.

## 2022-12-28 DIAGNOSIS — M170 Bilateral primary osteoarthritis of knee: Secondary | ICD-10-CM | POA: Diagnosis not present

## 2022-12-28 DIAGNOSIS — M47896 Other spondylosis, lumbar region: Secondary | ICD-10-CM | POA: Diagnosis not present

## 2022-12-30 ENCOUNTER — Telehealth: Payer: Self-pay | Admitting: *Deleted

## 2022-12-30 NOTE — Telephone Encounter (Signed)
Pre-operative Risk Assessment    Patient Name: Todd Chapman  DOB: [birth year] MRN: [id number]  DATE OF LAST VISIT: 10/06/22 DR. CAMNITZ DATE OF NEXT VISIT: NONE    Request for Surgical Clearance    Procedure:   LUMBAR ESI  Date of Surgery:  Clearance 01/19/23                                 Surgeon:  DR. Drucie Ip Surgeon's Group or Practice Name:  [facility] Phone number:  [phone] ATTN: SUSAN DRIVER Fax number:  [phone]   Type of Clearance Requested:   - Medical  - Pharmacy:  Hold Apixaban (Eliquis) x 3 DAYS PRIOR   Type of Anesthesia:  Not Indicated   Additional requests/questions:    Elpidio Anis   12/30/2022, 5:18 PM

## 2022-12-31 NOTE — Telephone Encounter (Signed)
Left message for the pt to call back to set up tele pre op appt 

## 2022-12-31 NOTE — Telephone Encounter (Signed)
Patient with diagnosis of afib on Eliquis for anticoagulation.    Procedure:  LUMBAR ESI  Date of procedure: 01/19/23   CHA2DS2-VASc Score = 2   This indicates a 2.2% annual risk of stroke. The patient's score is based upon: CHF History: 0 HTN History: 0 Diabetes History: 0 Stroke History: 0 Vascular Disease History: 0 Age Score: 2 Gender Score: 0      CrCl 113 ml/min  Per office protocol, patient can hold Eliquis for 3 days prior to procedure.    **This guidance is not considered finalized until pre-operative APP has relayed final recommendations.**

## 2022-12-31 NOTE — Telephone Encounter (Signed)
Primary Cardiologist:None   Preoperative team, please contact this patient and set up a phone call appointment for further preoperative risk assessment. Please obtain consent and complete medication review. Thank you for your help.   I confirm that guidance regarding antiplatelet and oral anticoagulation therapy has been completed and, if necessary, noted below. Per office protocol, patient can hold Eliquis for 3 days prior to procedure.    I also confirmed the patient resides in the state of [place]. As per [facility] Medical Board telemedicine laws, the patient must reside in the state in which the provider is licensed.   Levi Aland, NP-C  12/31/2022, 1:53 PM [address] Office [phone] Fax [phone]

## 2023-01-01 ENCOUNTER — Telehealth: Payer: Self-pay

## 2023-01-01 NOTE — Telephone Encounter (Signed)
  Patient Consent for Virtual Visit        MITCHAEL PARLEE has provided verbal consent on 01/01/2023 for a virtual visit (video or telephone).   CONSENT FOR VIRTUAL VISIT FOR:  Caralyn Guile [place]  By participating in this virtual visit I agree to the following:  I hereby voluntarily request, consent and authorize [facility] and its employed or contracted physicians, physician assistants, nurse practitioners or other licensed health care professionals (the Practitioner), to provide me with telemedicine health care services (the "Services") as deemed necessary by the treating Practitioner. I acknowledge and consent to receive the Services by the Practitioner via telemedicine. I understand that the telemedicine visit will involve communicating with the Practitioner through live audiovisual communication technology and the disclosure of certain medical information by electronic transmission. I acknowledge that I have been given the opportunity to request an in-person assessment or other available alternative prior to the telemedicine visit and am voluntarily participating in the telemedicine visit.  I understand that I have the right to withhold or withdraw my consent to the use of telemedicine in the course of my care at any time, without affecting my right to future care or treatment, and that the Practitioner or I may terminate the telemedicine visit at any time. I understand that I have the right to inspect all information obtained and/or recorded in the course of the telemedicine visit and may receive copies of available information for a reasonable fee.  I understand that some of the potential risks of receiving the Services via telemedicine include:  Delay or interruption in medical evaluation due to technological equipment failure or disruption; Information transmitted may not be sufficient (e.g. poor resolution of images) to allow for appropriate medical decision making by the  Practitioner; and/or  In rare instances, security protocols could fail, causing a breach of personal health information.  Furthermore, I acknowledge that it is my responsibility to provide information about my medical history, conditions and care that is complete and accurate to the best of my ability. I acknowledge that Practitioner's advice, recommendations, and/or decision may be based on factors not within their control, such as incomplete or inaccurate data provided by me or distortions of diagnostic images or specimens that may result from electronic transmissions. I understand that the practice of medicine is not an exact science and that Practitioner makes no warranties or guarantees regarding treatment outcomes. I acknowledge that a copy of this consent can be made available to me via my patient portal [facility] MyChart), or I can request a printed copy by calling the office of [facility].    I understand that my insurance will be billed for this visit.   I have read or had this consent read to me. I understand the contents of this consent, which adequately explains the benefits and risks of the Services being provided via telemedicine.  I have been provided ample opportunity to ask questions regarding this consent and the Services and have had my questions answered to my satisfaction. I give my informed consent for the services to be provided through the use of telemedicine in my medical care

## 2023-01-01 NOTE — Telephone Encounter (Signed)
Patient has now been scheduled for cardiac preop clearance appt, consent given. Pt request appt date to be mailed. Verified address and will print to mail.

## 2023-01-15 ENCOUNTER — Ambulatory Visit: Payer: Medicare Other | Attending: Nurse Practitioner

## 2023-01-15 DIAGNOSIS — Z01810 Encounter for preprocedural cardiovascular examination: Secondary | ICD-10-CM | POA: Diagnosis not present

## 2023-01-15 NOTE — Progress Notes (Signed)
Virtual Visit via Telephone Note   Because of Todd Chapman's co-morbid illnesses, he is at least at moderate risk for complications without adequate follow up.  This format is felt to be most appropriate for this patient at this time.  The patient did not have access to video technology/had technical difficulties with video requiring transitioning to audio format only (telephone).  All issues noted in this document were discussed and addressed.  No physical exam could be performed with this format.  Please refer to the patient's chart for his consent to telehealth for [facility].  Evaluation Performed:  Preoperative cardiovascular risk assessment _____________   Date:  01/15/2023   Patient ID:  Todd Chapman, DOB Feb 22, 1946, MRN [id number] Patient Location:  Home Provider location:   Office  Primary Care Provider:  Emilio Aspen, MD Primary Cardiologist:  None  Chief Complaint / Patient Profile   77 y.o. y/o male with a h/o persistent AF, sick sinus syndrome s/p PPM, HLD, GERD, prostate CA who is pending lumbar ESI and presents today for telephonic preoperative cardiovascular risk assessment.  History of Present Illness    Todd Chapman is a 77 y.o. male who presents via audio/video conferencing for a telehealth visit today.  Pt was last seen in cardiology clinic on 10/06/2022 by Dr. Elberta Fortis.  At that time Todd Chapman was doing well and endorsed some shortness of breath.  Followed by pulmonology who felt shortness of breath was not pulmonary in nature.  He completed a echocardiogram that was normal.  The patient is now pending procedure as outlined above. Since his last visit, he has been still experiencing shortness of breath which does not limit his activities of daily living but is bothersome and requires multiple rest periods.  He still works part-time and is still active walking quarter of a mile daily which is down from his 3 miles daily.  He reports no  accelerated heart rate or tachycardia.  He denies chest pain,lower extremity edema, fatigue, palpitations, melena, hematuria, hemoptysis, diaphoresis, weakness, presyncope, syncope.    Past Medical History    Past Medical History:  Diagnosis Date   Arthritis    "thumbs, back" (02/16/2017)   Bradycardia 02/2017   Dyspnea    with exertion   Flu 2 weeks ago   GERD (gastroesophageal reflux disease)    Hepatitis A as child   HLD (hyperlipidemia)    Persistent atrial fibrillation (HCC) 12/18/2016   Pneumonia 1999; 2018   Presence of permanent cardiac pacemaker    st jude   Prostate cancer [facility])    Past Surgical History:  Procedure Laterality Date   APPENDECTOMY     age 19   CARDIOVERSION  02/16/2017   CARDIOVERSION N/A 02/16/2017   Procedure: CARDIOVERSION;  Surgeon: Hillis Range, MD;  [facility];  Service: Cardiovascular;  Laterality: N/A;   COLONOSCOPY WITH PROPOFOL N/A 12/13/2012   Procedure: COLONOSCOPY WITH PROPOFOL;  Surgeon: Charolett Bumpers, MD;  Location: [facility];  Service: Endoscopy;  Laterality: N/A;   FRACTURE SURGERY     HYDROCELE EXCISION Left 05/16/2018   Procedure: HYDROCELECTOMY ADULT;  Surgeon: Marcine Matar, MD;  Location: [facility];  Service: Urology;  Laterality: Left;   INSERT / REPLACE / REMOVE PACEMAKER  02/16/2017   KYPHOPLASTY N/A 02/10/2021   Procedure: KYPHOPLASTY L-5;  Surgeon: Tressie Stalker, MD;  Location: [facility] OR;  Service: Neurosurgery;  Laterality: N/A;   LAPAROSCOPY N/A 06/04/2020   Procedure: POSSIBLE  LAPAROTOMY POSSIBLE BOWEL RESSECTION;  Surgeon: Berna Bue, MD;  Location: [facility];  Service: General;  Laterality: N/A;   PACEMAKER IMPLANT N/A 02/16/2017   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 by Dr Johney Frame   PROSTATE BIOPSY  11/23/2019   WRIST FRACTURE SURGERY Left    with pins    Allergies  Allergies  Allergen Reactions    Atorvastatin Palpitations   Ezetimibe Palpitations   Morphine And Codeine Other (See Comments)    Pt has hypersensitivity to morphine- makes confused after prolonged usage    Home Medications    Prior to Admission medications   Medication Sig Start Date End Date Taking? Authorizing Provider  acetaminophen (TYLENOL) 325 MG tablet Take 2 tablets (650 mg total) by mouth every 6 (six) hours. 02/21/21   Setzer, Lynnell Jude, PA-C  albuterol (VENTOLIN HFA) 108 (90 Base) MCG/ACT inhaler Inhale 1-2 puffs into the lungs every 6 (six) hours as needed for wheezing or shortness of breath. 08/23/22   Ernie Avena, MD  alendronate (FOSAMAX) 70 MG tablet Take 70 mg by mouth once a week.    [provider]  apixaban (ELIQUIS) 5 MG TABS tablet TAKE 1 TABLET(5 MG) BY MOUTH TWICE DAILY 12/07/22   Camnitz, Andree Coss, MD  calcium carbonate (TUMS - DOSED IN MG ELEMENTAL CALCIUM) 500 MG chewable tablet Chew 2 tablets by mouth daily as needed for indigestion or heartburn.    [provider]  Cholecalciferol (VITAMIN D3) 125 MCG (5000 UT) CAPS 2 capsules    [provider]  DULoxetine (CYMBALTA) 30 MG capsule Take 1 capsule (30 mg total) by mouth at bedtime. For back/knee pain 12/23/22   Lovorn, Aundra Millet, MD  DULoxetine (CYMBALTA) 60 MG capsule Take 1 capsule (60 mg total) by mouth at bedtime. For joint/back pain 12/23/22   Lovorn, Aundra Millet, MD  lidocaine (LIDODERM) 5 % Place 2 patches onto the skin daily. 12 hrs on;12 hrs off- for R paraspinal/sciatic pain- Remove & Discard patch within 12 hours or as directed by MD 03/25/22   Genice Rouge, MD  Lidocaine 5 % CREA     [provider]  Multiple Vitamin (MULTIVITAMIN WITH MINERALS) TABS tablet Take 1 tablet by mouth daily.    [provider]  Multiple Vitamins-Minerals (PRESERVISION AREDS 2) CAPS 1 capsule    [provider]  olmesartan (BENICAR) 20 MG tablet Take 20 mg by mouth daily. 10/27/21   [provider]   pantoprazole (PROTONIX) 40 MG tablet Take 40 mg by mouth daily. 10/16/16   [provider]  Polyethyl Glycol-Propyl Glycol (SYSTANE) 0.4-0.3 % GEL ophthalmic gel See admin instructions.    [provider]  polyethylene glycol (MIRALAX / GLYCOLAX) 17 g packet Take 17 g by mouth daily. 02/21/21   Setzer, Lynnell Jude, PA-C  Propylene Glycol (SYSTANE BALANCE OP) Place 1 drop into both eyes daily as needed (dry eyes).    [provider]  psyllium (METAMUCIL) 58.6 % packet Take 1 packet by mouth daily.    [provider]  rosuvastatin (CRESTOR) 10 MG tablet Take 10 mg by mouth every morning.    [provider]  sodium chloride (OCEAN) 0.65 % SOLN nasal spray Place 1 spray into both nostrils at bedtime.    [provider]  trolamine salicylate (ASPERCREME) 10 % cream Apply 1 application  topically daily.    [provider]    Physical Exam    Vital Signs:  Caralyn Guile  Chuck does not have vital signs available for review today.  Given telephonic nature of communication, physical exam is limited. AAOx3. NAD. Normal affect.  Speech and respirations are unlabored.  Accessory Clinical Findings    None  Assessment & Plan    1.  Preoperative Cardiovascular Risk Assessment: -Patient's RCRI score is 0.4%  The patient affirms he has been doing well without any new cardiac symptoms. They are able to achieve 7 METS without cardiac limitations. Therefore, based on ACC/AHA guidelines, the patient would be at acceptable risk for the planned procedure without further cardiovascular testing. The patient was advised that if he develops new symptoms prior to surgery to contact our office to arrange for a follow-up visit, and he verbalized understanding.   The patient was advised that if he develops new symptoms prior to surgery to contact our office to arrange for a follow-up visit, and he verbalized understanding.  Per office protocol, patient can hold  Eliquis for 3 days prior to procedure.    A copy of this note will be routed to requesting surgeon.  Time:   Today, I have spent 8 minutes with the patient with telehealth technology discussing medical history, symptoms, and management plan.     Napoleon Form, Leodis Rains, NP  01/15/2023, 7:29 AM

## 2023-01-19 DIAGNOSIS — M533 Sacrococcygeal disorders, not elsewhere classified: Secondary | ICD-10-CM | POA: Diagnosis not present

## 2023-02-05 ENCOUNTER — Ambulatory Visit (INDEPENDENT_AMBULATORY_CARE_PROVIDER_SITE_OTHER): Payer: Medicare Other

## 2023-02-05 DIAGNOSIS — I495 Sick sinus syndrome: Secondary | ICD-10-CM

## 2023-02-05 LAB — CUP PACEART REMOTE DEVICE CHECK
Battery Remaining Longevity: 65 mo
Battery Remaining Percentage: 48 %
Battery Voltage: 3.01 V
Brady Statistic RV Percent Paced: 10 %
Date Time Interrogation Session: 20241129041801
Implantable Lead Connection Status: 753985
Implantable Lead Connection Status: 753985
Implantable Lead Implant Date: 20181211
Implantable Lead Implant Date: 20181211
Implantable Lead Location: 753859
Implantable Lead Location: 753860
Implantable Pulse Generator Implant Date: 20181211
Lead Channel Impedance Value: 350 Ohm
Lead Channel Pacing Threshold Amplitude: 1 V
Lead Channel Pacing Threshold Pulse Width: 0.5 ms
Lead Channel Sensing Intrinsic Amplitude: 6.4 mV
Lead Channel Setting Pacing Amplitude: 2.5 V
Lead Channel Setting Pacing Pulse Width: 0.5 ms
Lead Channel Setting Sensing Sensitivity: 2 mV
Pulse Gen Model: 2272
Pulse Gen Serial Number: 8967264

## 2023-02-09 DIAGNOSIS — Z8546 Personal history of malignant neoplasm of prostate: Secondary | ICD-10-CM | POA: Diagnosis not present

## 2023-02-09 DIAGNOSIS — C61 Malignant neoplasm of prostate: Secondary | ICD-10-CM | POA: Diagnosis not present

## 2023-02-18 DIAGNOSIS — M47896 Other spondylosis, lumbar region: Secondary | ICD-10-CM | POA: Diagnosis not present

## 2023-02-18 DIAGNOSIS — M170 Bilateral primary osteoarthritis of knee: Secondary | ICD-10-CM | POA: Diagnosis not present

## 2023-02-26 NOTE — Progress Notes (Signed)
Remote pacemaker transmission.   

## 2023-03-24 DIAGNOSIS — M170 Bilateral primary osteoarthritis of knee: Secondary | ICD-10-CM | POA: Diagnosis not present

## 2023-03-26 ENCOUNTER — Ambulatory Visit: Payer: Medicare Other | Admitting: Physical Medicine and Rehabilitation

## 2023-03-26 DIAGNOSIS — J40 Bronchitis, not specified as acute or chronic: Secondary | ICD-10-CM | POA: Diagnosis not present

## 2023-03-26 DIAGNOSIS — Z8701 Personal history of pneumonia (recurrent): Secondary | ICD-10-CM | POA: Diagnosis not present

## 2023-04-06 DIAGNOSIS — I10 Essential (primary) hypertension: Secondary | ICD-10-CM | POA: Diagnosis not present

## 2023-04-06 DIAGNOSIS — I4891 Unspecified atrial fibrillation: Secondary | ICD-10-CM | POA: Diagnosis not present

## 2023-04-06 DIAGNOSIS — J40 Bronchitis, not specified as acute or chronic: Secondary | ICD-10-CM | POA: Diagnosis not present

## 2023-04-06 DIAGNOSIS — D6869 Other thrombophilia: Secondary | ICD-10-CM | POA: Diagnosis not present

## 2023-04-07 DIAGNOSIS — J40 Bronchitis, not specified as acute or chronic: Secondary | ICD-10-CM | POA: Diagnosis not present

## 2023-05-07 ENCOUNTER — Ambulatory Visit: Payer: Medicare Other

## 2023-05-11 ENCOUNTER — Ambulatory Visit (INDEPENDENT_AMBULATORY_CARE_PROVIDER_SITE_OTHER): Payer: Medicare Other

## 2023-05-11 DIAGNOSIS — I495 Sick sinus syndrome: Secondary | ICD-10-CM

## 2023-05-12 LAB — CUP PACEART REMOTE DEVICE CHECK
Battery Remaining Longevity: 63 mo
Battery Remaining Percentage: 47 %
Battery Voltage: 3.01 V
Brady Statistic RV Percent Paced: 7.5 %
Date Time Interrogation Session: 20250228030434
Implantable Lead Connection Status: 753985
Implantable Lead Connection Status: 753985
Implantable Lead Implant Date: 20181211
Implantable Lead Implant Date: 20181211
Implantable Lead Location: 753859
Implantable Lead Location: 753860
Implantable Pulse Generator Implant Date: 20181211
Lead Channel Impedance Value: 390 Ohm
Lead Channel Pacing Threshold Amplitude: 1 V
Lead Channel Pacing Threshold Pulse Width: 0.5 ms
Lead Channel Sensing Intrinsic Amplitude: 12 mV
Lead Channel Setting Pacing Amplitude: 2.5 V
Lead Channel Setting Pacing Pulse Width: 0.5 ms
Lead Channel Setting Sensing Sensitivity: 2 mV
Pulse Gen Model: 2272
Pulse Gen Serial Number: 8967264

## 2023-06-03 DIAGNOSIS — J40 Bronchitis, not specified as acute or chronic: Secondary | ICD-10-CM | POA: Diagnosis not present

## 2023-06-03 DIAGNOSIS — I10 Essential (primary) hypertension: Secondary | ICD-10-CM | POA: Diagnosis not present

## 2023-06-03 DIAGNOSIS — I4891 Unspecified atrial fibrillation: Secondary | ICD-10-CM | POA: Diagnosis not present

## 2023-06-07 DIAGNOSIS — J40 Bronchitis, not specified as acute or chronic: Secondary | ICD-10-CM | POA: Diagnosis not present

## 2023-06-07 DIAGNOSIS — E7800 Pure hypercholesterolemia, unspecified: Secondary | ICD-10-CM | POA: Diagnosis not present

## 2023-06-07 DIAGNOSIS — I10 Essential (primary) hypertension: Secondary | ICD-10-CM | POA: Diagnosis not present

## 2023-06-07 DIAGNOSIS — E785 Hyperlipidemia, unspecified: Secondary | ICD-10-CM | POA: Diagnosis not present

## 2023-06-14 NOTE — Addendum Note (Signed)
 Addended by: Geralyn Flash D on: 06/14/2023 04:50 PM   Modules accepted: Orders

## 2023-06-14 NOTE — Progress Notes (Signed)
 Remote pacemaker transmission.

## 2023-06-16 DIAGNOSIS — D6869 Other thrombophilia: Secondary | ICD-10-CM | POA: Diagnosis not present

## 2023-06-16 DIAGNOSIS — G8929 Other chronic pain: Secondary | ICD-10-CM | POA: Diagnosis not present

## 2023-06-16 DIAGNOSIS — M545 Low back pain, unspecified: Secondary | ICD-10-CM | POA: Diagnosis not present

## 2023-06-16 DIAGNOSIS — Z950 Presence of cardiac pacemaker: Secondary | ICD-10-CM | POA: Diagnosis not present

## 2023-06-16 DIAGNOSIS — I10 Essential (primary) hypertension: Secondary | ICD-10-CM | POA: Diagnosis not present

## 2023-06-16 DIAGNOSIS — M1711 Unilateral primary osteoarthritis, right knee: Secondary | ICD-10-CM | POA: Diagnosis not present

## 2023-06-16 DIAGNOSIS — M8000XS Age-related osteoporosis with current pathological fracture, unspecified site, sequela: Secondary | ICD-10-CM | POA: Diagnosis not present

## 2023-06-16 DIAGNOSIS — I4891 Unspecified atrial fibrillation: Secondary | ICD-10-CM | POA: Diagnosis not present

## 2023-06-16 DIAGNOSIS — J479 Bronchiectasis, uncomplicated: Secondary | ICD-10-CM | POA: Diagnosis not present

## 2023-07-05 ENCOUNTER — Other Ambulatory Visit: Payer: Self-pay | Admitting: Cardiology

## 2023-07-05 DIAGNOSIS — I4819 Other persistent atrial fibrillation: Secondary | ICD-10-CM

## 2023-07-05 NOTE — Telephone Encounter (Signed)
 Prescription refill request for Eliquis  received. Indication:afib Last office visit:7/24 Scr:0.74  6/24 Age: 78 Weight:99.3  kg  Prescription refilled

## 2023-07-15 DIAGNOSIS — M1711 Unilateral primary osteoarthritis, right knee: Secondary | ICD-10-CM | POA: Diagnosis not present

## 2023-07-15 DIAGNOSIS — M8000XS Age-related osteoporosis with current pathological fracture, unspecified site, sequela: Secondary | ICD-10-CM | POA: Diagnosis not present

## 2023-07-15 DIAGNOSIS — R0602 Shortness of breath: Secondary | ICD-10-CM | POA: Diagnosis not present

## 2023-07-15 DIAGNOSIS — K219 Gastro-esophageal reflux disease without esophagitis: Secondary | ICD-10-CM | POA: Diagnosis not present

## 2023-07-15 DIAGNOSIS — E559 Vitamin D deficiency, unspecified: Secondary | ICD-10-CM | POA: Diagnosis not present

## 2023-07-15 DIAGNOSIS — C61 Malignant neoplasm of prostate: Secondary | ICD-10-CM | POA: Diagnosis not present

## 2023-07-15 DIAGNOSIS — E7800 Pure hypercholesterolemia, unspecified: Secondary | ICD-10-CM | POA: Diagnosis not present

## 2023-07-15 DIAGNOSIS — I10 Essential (primary) hypertension: Secondary | ICD-10-CM | POA: Diagnosis not present

## 2023-07-15 DIAGNOSIS — Z23 Encounter for immunization: Secondary | ICD-10-CM | POA: Diagnosis not present

## 2023-07-15 DIAGNOSIS — K5730 Diverticulosis of large intestine without perforation or abscess without bleeding: Secondary | ICD-10-CM | POA: Diagnosis not present

## 2023-07-15 DIAGNOSIS — D6869 Other thrombophilia: Secondary | ICD-10-CM | POA: Diagnosis not present

## 2023-07-15 DIAGNOSIS — Z79899 Other long term (current) drug therapy: Secondary | ICD-10-CM | POA: Diagnosis not present

## 2023-07-15 DIAGNOSIS — Z1331 Encounter for screening for depression: Secondary | ICD-10-CM | POA: Diagnosis not present

## 2023-07-15 DIAGNOSIS — I4891 Unspecified atrial fibrillation: Secondary | ICD-10-CM | POA: Diagnosis not present

## 2023-07-15 DIAGNOSIS — Z0000 Encounter for general adult medical examination without abnormal findings: Secondary | ICD-10-CM | POA: Diagnosis not present

## 2023-07-15 DIAGNOSIS — G894 Chronic pain syndrome: Secondary | ICD-10-CM | POA: Diagnosis not present

## 2023-08-03 ENCOUNTER — Other Ambulatory Visit (HOSPITAL_BASED_OUTPATIENT_CLINIC_OR_DEPARTMENT_OTHER): Payer: Self-pay

## 2023-08-03 DIAGNOSIS — Z8546 Personal history of malignant neoplasm of prostate: Secondary | ICD-10-CM | POA: Diagnosis not present

## 2023-08-07 DIAGNOSIS — E785 Hyperlipidemia, unspecified: Secondary | ICD-10-CM | POA: Diagnosis not present

## 2023-08-07 DIAGNOSIS — I10 Essential (primary) hypertension: Secondary | ICD-10-CM | POA: Diagnosis not present

## 2023-08-07 DIAGNOSIS — E7800 Pure hypercholesterolemia, unspecified: Secondary | ICD-10-CM | POA: Diagnosis not present

## 2023-08-07 DIAGNOSIS — I4891 Unspecified atrial fibrillation: Secondary | ICD-10-CM | POA: Diagnosis not present

## 2023-08-07 DIAGNOSIS — J40 Bronchitis, not specified as acute or chronic: Secondary | ICD-10-CM | POA: Diagnosis not present

## 2023-08-09 LAB — CUP PACEART REMOTE DEVICE CHECK
Battery Remaining Longevity: 62 mo
Battery Remaining Percentage: 45 %
Battery Voltage: 2.99 V
Brady Statistic RV Percent Paced: 7.8 %
Date Time Interrogation Session: 20250531044501
Implantable Lead Connection Status: 753985
Implantable Lead Connection Status: 753985
Implantable Lead Implant Date: 20181211
Implantable Lead Implant Date: 20181211
Implantable Lead Location: 753859
Implantable Lead Location: 753860
Implantable Pulse Generator Implant Date: 20181211
Lead Channel Impedance Value: 400 Ohm
Lead Channel Pacing Threshold Amplitude: 1 V
Lead Channel Pacing Threshold Pulse Width: 0.5 ms
Lead Channel Sensing Intrinsic Amplitude: 9 mV
Lead Channel Setting Pacing Amplitude: 2.5 V
Lead Channel Setting Pacing Pulse Width: 0.5 ms
Lead Channel Setting Sensing Sensitivity: 2 mV
Pulse Gen Model: 2272
Pulse Gen Serial Number: 8967264

## 2023-08-10 ENCOUNTER — Ambulatory Visit (INDEPENDENT_AMBULATORY_CARE_PROVIDER_SITE_OTHER): Payer: Medicare Other

## 2023-08-10 DIAGNOSIS — I495 Sick sinus syndrome: Secondary | ICD-10-CM | POA: Diagnosis not present

## 2023-08-20 ENCOUNTER — Ambulatory Visit: Payer: Self-pay | Admitting: Cardiology

## 2023-08-20 DIAGNOSIS — N401 Enlarged prostate with lower urinary tract symptoms: Secondary | ICD-10-CM | POA: Diagnosis not present

## 2023-08-20 DIAGNOSIS — Z8546 Personal history of malignant neoplasm of prostate: Secondary | ICD-10-CM | POA: Diagnosis not present

## 2023-08-20 DIAGNOSIS — R351 Nocturia: Secondary | ICD-10-CM | POA: Diagnosis not present

## 2023-08-29 DIAGNOSIS — J40 Bronchitis, not specified as acute or chronic: Secondary | ICD-10-CM | POA: Diagnosis not present

## 2023-08-29 DIAGNOSIS — I10 Essential (primary) hypertension: Secondary | ICD-10-CM | POA: Diagnosis not present

## 2023-08-29 DIAGNOSIS — I4891 Unspecified atrial fibrillation: Secondary | ICD-10-CM | POA: Diagnosis not present

## 2023-09-06 DIAGNOSIS — I4891 Unspecified atrial fibrillation: Secondary | ICD-10-CM | POA: Diagnosis not present

## 2023-09-06 DIAGNOSIS — I10 Essential (primary) hypertension: Secondary | ICD-10-CM | POA: Diagnosis not present

## 2023-09-06 DIAGNOSIS — E7800 Pure hypercholesterolemia, unspecified: Secondary | ICD-10-CM | POA: Diagnosis not present

## 2023-09-06 DIAGNOSIS — J40 Bronchitis, not specified as acute or chronic: Secondary | ICD-10-CM | POA: Diagnosis not present

## 2023-09-06 DIAGNOSIS — E785 Hyperlipidemia, unspecified: Secondary | ICD-10-CM | POA: Diagnosis not present

## 2023-10-05 NOTE — Progress Notes (Signed)
 Remote pacemaker transmission.

## 2023-10-07 ENCOUNTER — Ambulatory Visit: Payer: Self-pay | Admitting: Pulmonary Disease

## 2023-10-07 DIAGNOSIS — E7800 Pure hypercholesterolemia, unspecified: Secondary | ICD-10-CM | POA: Diagnosis not present

## 2023-10-07 DIAGNOSIS — E785 Hyperlipidemia, unspecified: Secondary | ICD-10-CM | POA: Diagnosis not present

## 2023-10-07 DIAGNOSIS — I4891 Unspecified atrial fibrillation: Secondary | ICD-10-CM | POA: Diagnosis not present

## 2023-10-07 DIAGNOSIS — I10 Essential (primary) hypertension: Secondary | ICD-10-CM | POA: Diagnosis not present

## 2023-10-07 DIAGNOSIS — J40 Bronchitis, not specified as acute or chronic: Secondary | ICD-10-CM | POA: Diagnosis not present

## 2023-10-07 NOTE — Telephone Encounter (Signed)
 FYI Only or Action Required?: FYI only for provider.  Patient is followed in Pulmonology for DOE, last seen on 10/06/2022 by Hunsucker, Donnice SAUNDERS, MD.  Called Nurse Triage reporting Follow-up.  Symptoms began several years ago.   Triage Disposition: See PCP Within 2 Weeks  Patient/caregiver understands and will follow disposition?: Yes     Copied from CRM [id number]. Topic: Clinical - Red Word Triage >> Oct 07, 2023  3:07 PM Rilla B wrote: [facility] that prompted transfer to Nurse Triage: Shortness of Breath, tired   ----------------------------------------------------------------------- From previous Reason for Contact - Scheduling: Patient/patient representative is calling to schedule an appointment. Refer to attachments for appointment information. Reason for Disposition  Requesting regular office appointment  Answer Assessment - Initial Assessment Questions 1. REASON FOR CALL: What is the main reason for your call? or How can I best help you?     Calling to schedule a F/U Reports ongoing issue - reports PCP advised a F/U with [facility] since he has not been seen in > 1 year Pt also endorsed having a pacemaker and has upcoming cardiology appt 2. SYMPTOMS : Do you have any symptoms?      SOB, tired - endorses sx are chronic, ongoing - denies any acute sx at this time 3. OTHER QUESTIONS: Do you have any other questions?     N/a    Scheduled patient on the next available OV appt on 11/25/2023 with Dr. Annella .  Protocols used: Information Only Call - No Triage-A-AH

## 2023-10-19 DIAGNOSIS — I4891 Unspecified atrial fibrillation: Secondary | ICD-10-CM | POA: Diagnosis not present

## 2023-10-19 DIAGNOSIS — J40 Bronchitis, not specified as acute or chronic: Secondary | ICD-10-CM | POA: Diagnosis not present

## 2023-10-19 DIAGNOSIS — I10 Essential (primary) hypertension: Secondary | ICD-10-CM | POA: Diagnosis not present

## 2023-10-28 DIAGNOSIS — L821 Other seborrheic keratosis: Secondary | ICD-10-CM | POA: Diagnosis not present

## 2023-10-28 DIAGNOSIS — D485 Neoplasm of uncertain behavior of skin: Secondary | ICD-10-CM | POA: Diagnosis not present

## 2023-10-28 DIAGNOSIS — L570 Actinic keratosis: Secondary | ICD-10-CM | POA: Diagnosis not present

## 2023-10-28 DIAGNOSIS — D2371 Other benign neoplasm of skin of right lower limb, including hip: Secondary | ICD-10-CM | POA: Diagnosis not present

## 2023-10-28 DIAGNOSIS — D225 Melanocytic nevi of trunk: Secondary | ICD-10-CM | POA: Diagnosis not present

## 2023-11-05 ENCOUNTER — Ambulatory Visit: Attending: Physician Assistant | Admitting: Physician Assistant

## 2023-11-05 VITALS — BP 107/54 | HR 50 | Ht 74.0 in | Wt 217.0 lb

## 2023-11-05 DIAGNOSIS — R5383 Other fatigue: Secondary | ICD-10-CM | POA: Insufficient documentation

## 2023-11-05 DIAGNOSIS — Z950 Presence of cardiac pacemaker: Secondary | ICD-10-CM | POA: Insufficient documentation

## 2023-11-05 DIAGNOSIS — I4821 Permanent atrial fibrillation: Secondary | ICD-10-CM | POA: Insufficient documentation

## 2023-11-05 DIAGNOSIS — D6869 Other thrombophilia: Secondary | ICD-10-CM | POA: Insufficient documentation

## 2023-11-05 LAB — CUP PACEART INCLINIC DEVICE CHECK
Battery Remaining Longevity: 61 mo
Battery Voltage: 2.99 V
Brady Statistic RA Percent Paced: 0 %
Brady Statistic RV Percent Paced: 13 %
Date Time Interrogation Session: 20250829174616
Implantable Lead Connection Status: 753985
Implantable Lead Connection Status: 753985
Implantable Lead Implant Date: 20181211
Implantable Lead Implant Date: 20181211
Implantable Lead Location: 753859
Implantable Lead Location: 753860
Implantable Pulse Generator Implant Date: 20181211
Lead Channel Impedance Value: 375 Ohm
Lead Channel Pacing Threshold Amplitude: 0.625 V
Lead Channel Pacing Threshold Amplitude: 1 V
Lead Channel Pacing Threshold Amplitude: 1 V
Lead Channel Pacing Threshold Pulse Width: 0.5 ms
Lead Channel Pacing Threshold Pulse Width: 0.5 ms
Lead Channel Pacing Threshold Pulse Width: 0.5 ms
Lead Channel Sensing Intrinsic Amplitude: 5 mV
Lead Channel Sensing Intrinsic Amplitude: 8.8 mV
Lead Channel Setting Pacing Amplitude: 2.5 V
Lead Channel Setting Pacing Pulse Width: 0.5 ms
Lead Channel Setting Sensing Sensitivity: 2 mV
Pulse Gen Model: 2272
Pulse Gen Serial Number: 8967264

## 2023-11-05 NOTE — Patient Instructions (Signed)
 Medication Instructions:   Your physician recommends that you continue on your current medications as directed. Please refer to the Current Medication list given to you today.   *If you need a refill on your cardiac medications before your next appointment, please call your pharmacy*   Lab Work: NONE ORDERED  TODAY   If you have labs (blood work) drawn today and your tests are completely normal, you will receive your results only by: MyChart Message (if you have MyChart) OR A paper copy in the mail If you have any lab test that is abnormal or we need to change your treatment, we will call you to review the results.  Testing/Procedures: NONE ORDERED  TODAY    Follow-Up: At [facility], you and your health needs are our priority.  As part of our continuing mission to provide you with exceptional heart care, our providers are all part of one team.  This team includes your primary Cardiologist (physician) and Advanced Practice Providers or APPs (Physician Assistants and Nurse Practitioners) who all work together to provide you with the care you need, when you need it.  Your next appointment:   1 year(s)  Provider:   Agatha Horsfall, MD or Mertha Abrahams, PA-C    We recommend signing up for the patient portal called "MyChart".  Sign up information is provided on this After Visit Summary.  MyChart is used to connect with patients for Virtual Visits (Telemedicine).  Patients are able to view lab/test results, encounter notes, upcoming appointments, etc.  Non-urgent messages can be sent to your provider as well.   To learn more about what you can do with MyChart, go to [email or web address].   Other Instructions

## 2023-11-05 NOTE — Progress Notes (Signed)
 Cardiology Office Note:  .   Date:  11/05/2023  ID:  Todd Chapman, DOB [birth year], MRN [id number] PCP: Charlott Dorn LABOR, MD  [facility] Providers Cardiologist:  None Electrophysiologist:  Will Gladis Norton, MD   History of Present Illness: .   Todd Chapman is a 78 y.o. 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 Walking 1/4 mile daily DOWN from 3 miles No new w/u recommended   Today's visit is scheduled as an annual device visit ROS:   He reports his symptoms started largely about 2022 Mostly seems connected to his prostate cancer treatment > hormone blocker >> ultimately no/low testosterone  He has fallen asleep at a stop lights Naps at [facility]  Generally no energy, fatigued all of the time Sleeps 8-9 hours a night and feels like it is good sleep.  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 Left ventricular ejection fraction, by estimation, is 60 to 65%. The  left  ventricle has normal function. The left ventricle has no regional  wall motion abnormalities. There is mild left ventricular hypertrophy.  Left ventricular diastolic parameters  are indeterminate. Elevated left atrial pressure.   2. Right ventricular systolic function is normal. The right ventricular  size is mildly enlarged.   3. Left atrial size was severely dilated.   4. Right atrial size was severely dilated.   5. The mitral valve is normal in structure. Mild mitral valve  regurgitation. No evidence of mitral stenosis.   6. Tricuspid valve regurgitation is mild to moderate.   7. The aortic valve is tricuspid. Aortic valve regurgitation is not  visualized. Aortic valve sclerosis is present, with no evidence of aortic  valve stenosis.   8. Aortic dilatation noted. 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   07/18/2020: stress myoview  The left ventricular ejection fraction is hyperdynamic (>65%). Nuclear stress EF: 70%. There was no ST segment deviation noted during stress. The study is normal. This is a low risk study.  01/27/17: TTE Study Conclusions - Left ventricle: The cavity size was normal. Wall thickness was   normal. Systolic function was vigorous. The estimated ejection   fraction was in the range of 65% to 70%. Wall motion was normal;   there were no regional wall motion abnormalities. The study is   not technically sufficient to allow evaluation of LV diastolic   function. - Aortic valve: Trileaflet. Sclerosis without stenosis. There was   no regurgitation. - Left atrium:  Moderately dilated. - Right atrium: Moderately dilated. - Tricuspid valve: There was mild regurgitation. - Pulmonary arteries: PA peak pressure: 41 mm Hg (S)+ RAP. - Pericardium, extracardiac: There was no pericardial effusion.   Impressions: - Compared to a prior study in 2016, there are few changes. The   RVSP is higher at 41 mmHg + RAP and there is now  moderate   biatrial enlargement.   Risk Assessment/Calculations:    Physical Exam:   VS:  There were no vitals taken for this visit.   Wt Readings from Last 3 Encounters:  12/23/22 219 lb (99.3 kg)  10/06/22 223 lb 6.4 oz (101.3 kg)  10/06/22 221 lb 9.6 oz (100.5 kg)    GEN: Well nourished, well developed in no acute distress NECK: No JVD; No carotid bruits CARDIAC: RRR, no murmurs, rubs, gallops RESPIRATORY:  CTA b/l without rales, wheezing or rhonchi  ABDOMEN: Soft, non-tender, non-distended EXTREMITIES: No edema; No deformity   PPM site: is stable, no thinning, fluctuation, tethering  ASSESSMENT AND PLAN: .    PPM intact function no programming changes made  Discussed today that he has a fairly reasonable HR excursion and that I did not thin pacing him faster would provide any benefit since his intrinsic rates get towards 80's  And also discussed RV apical pacing is not ideal with potential negative impact   permanent AFib CHA2DS2Vasc is 2, on Eliquis , appropriately dosed Rate controlled he reports labs recently done by his PMD  Secondary hypercoagulable state 2/2 AFib  4. Fatigue, poor exertional capacity Goes back years -- His AFib deemed permanent back in 2023 > I doubt we will be able to get any durable SR at this juncture. -- long discussion about poss sleep apnea > though he is certain that he does not have it and would not want to pursue testing He does not appear volume OL  Deferred to his PMD I have advised him not to drive until cleared to by his PMD, give he has fallen asleep at stop lights       Dispo: remotes as usual, back in c linic with EP in a year again, sooner if needed  Signed, Charlies Macario Arthur, PA-C

## 2023-11-07 DIAGNOSIS — J40 Bronchitis, not specified as acute or chronic: Secondary | ICD-10-CM | POA: Diagnosis not present

## 2023-11-07 DIAGNOSIS — E7800 Pure hypercholesterolemia, unspecified: Secondary | ICD-10-CM | POA: Diagnosis not present

## 2023-11-07 DIAGNOSIS — I10 Essential (primary) hypertension: Secondary | ICD-10-CM | POA: Diagnosis not present

## 2023-11-07 DIAGNOSIS — I4891 Unspecified atrial fibrillation: Secondary | ICD-10-CM | POA: Diagnosis not present

## 2023-11-07 DIAGNOSIS — E785 Hyperlipidemia, unspecified: Secondary | ICD-10-CM | POA: Diagnosis not present

## 2023-11-09 ENCOUNTER — Ambulatory Visit (INDEPENDENT_AMBULATORY_CARE_PROVIDER_SITE_OTHER): Payer: Medicare Other

## 2023-11-09 DIAGNOSIS — I4821 Permanent atrial fibrillation: Secondary | ICD-10-CM | POA: Diagnosis not present

## 2023-11-11 ENCOUNTER — Ambulatory Visit: Payer: Self-pay | Admitting: Cardiology

## 2023-11-11 LAB — CUP PACEART REMOTE DEVICE CHECK
Battery Remaining Longevity: 55 mo
Battery Remaining Percentage: 43 %
Battery Voltage: 2.99 V
Brady Statistic RV Percent Paced: 37 %
Date Time Interrogation Session: 20250829230244
Implantable Lead Connection Status: 753985
Implantable Lead Connection Status: 753985
Implantable Lead Implant Date: 20181211
Implantable Lead Implant Date: 20181211
Implantable Lead Location: 753859
Implantable Lead Location: 753860
Implantable Pulse Generator Implant Date: 20181211
Lead Channel Impedance Value: 380 Ohm
Lead Channel Pacing Threshold Amplitude: 1 V
Lead Channel Pacing Threshold Pulse Width: 0.5 ms
Lead Channel Sensing Intrinsic Amplitude: 7.7 mV
Lead Channel Setting Pacing Amplitude: 2.5 V
Lead Channel Setting Pacing Pulse Width: 0.5 ms
Lead Channel Setting Sensing Sensitivity: 2 mV
Pulse Gen Model: 2272
Pulse Gen Serial Number: 8967264

## 2023-11-17 NOTE — Progress Notes (Signed)
 Remote PPM Transmission

## 2023-11-20 DIAGNOSIS — J40 Bronchitis, not specified as acute or chronic: Secondary | ICD-10-CM | POA: Diagnosis not present

## 2023-11-20 DIAGNOSIS — I10 Essential (primary) hypertension: Secondary | ICD-10-CM | POA: Diagnosis not present

## 2023-11-20 DIAGNOSIS — I4891 Unspecified atrial fibrillation: Secondary | ICD-10-CM | POA: Diagnosis not present

## 2023-11-25 ENCOUNTER — Other Ambulatory Visit (HOSPITAL_BASED_OUTPATIENT_CLINIC_OR_DEPARTMENT_OTHER): Payer: Self-pay

## 2023-11-25 ENCOUNTER — Ambulatory Visit: Admitting: Pulmonary Disease

## 2023-11-25 ENCOUNTER — Encounter: Payer: Self-pay | Admitting: Pulmonary Disease

## 2023-11-25 VITALS — BP 130/80 | HR 63 | Temp 98.0°F | Ht 71.0 in | Wt 217.0 lb

## 2023-11-25 DIAGNOSIS — R0609 Other forms of dyspnea: Secondary | ICD-10-CM | POA: Diagnosis not present

## 2023-11-25 DIAGNOSIS — Z23 Encounter for immunization: Secondary | ICD-10-CM | POA: Diagnosis not present

## 2023-11-25 MED ORDER — FLUZONE HIGH-DOSE 0.5 ML IM SUSY
0.5000 mL | PREFILLED_SYRINGE | Freq: Once | INTRAMUSCULAR | 0 refills | Status: AC
  Filled 2023-11-25: qty 0.5, 1d supply, fill #0

## 2023-11-25 NOTE — Progress Notes (Signed)
 @Patient  ID: Todd Chapman, male    DOB: 09/05/1945, 78 y.o.   MRN: [id number]  Chief Complaint  Patient presents with   Shortness of Breath    No improvement. Regular check-up.    Referring provider: Charlott Dorn LABOR, *  HPI:   78 y.o. man whom we are seeing in follow up for dyspnea on exertion.  Multiple cardiology notes reviewed.  At last visit he does to pursue a more holistic approach to his health and shortness of breath.  He returns today with some questions with ongoing work towards improving shortness of breath.  His breathing is no worse.  No better.  Unchanged.  He feels weak at times.  He thinks a lot of this is attributed to his prostate cancer treatment and lack of testosterone .  We discussed at length potential investigation for shortness of breath.  He would like to improve his strength and stamina.  Plan to do so with a trainer.  I wholeheartedly support this.  Most likely the best thing he can do for his overall health and his shortness of breath.  Reviewed his most recent CT scan 08/2022 that shows scattered chronic changes after massive aspiration pneumonia in 2022.  Overall reassuring images.  HPI at initial visit: Patient was admitted 06/01/2020 in setting of abdominal pain and nausea and vomiting.  Has been having chronic diarrhea but the prior 24 hours he had severe constipation.  Had projectile vomiting.  Came to the ED.  CT scan showed high-grade small bowel obstruction.  The portion of the right lower lobe that was seen on the CT scan on my interpretation reveals diffuse nodular and groundglass infiltrates consistent with pneumonia, notably left lower lobe is totally clear.  He was treated with Unasyn  for aspiration pneumonia.  Went to the OR for lysis of adhesions for SBO.  He improved and was discharged on 06/06/2020.  Serial chest x-rays including 3/29, 4/11, and 6/7 were reviewed and on my interpretation reveal slowly and mildly improving right lower lobe  infiltrate that persists over time.  Returns he feels, he rates himself a 7 out of 10.  He notes that he finished radiation for prostate cancer early March 2022.  He feels like he still recovering from that.  Said it wiped him out, but he felt quite fatigued.  Less active.  Reports he was hospitalized March 2022 and still feel like he is recovering from that.  Has some dyspnea on exertion.  However he does note that he does 20 minutes or so on elliptical routinely.  Can push up to 40 minutes but feels worn out, legs tired.  He occasionally every week or 2 coughs up some orange appearing phlegm from his lungs.  He has a history of reflux.  Treated pantoprazole .  Does note some hoarse voice occasionally in the morning but slowly clears over the course of the day.  Feels like there is phlegm there but nothing comes out.  He has mild nasal congestion that he treats with saline rinses.  Denies sensation of postnasal drip.  PMH: Hyperlipidemia, GERD, A. fib on Eliquis  Surgical history: Appendectomy, lysis of adhesions pacemaker placement Family history: Mother with CVA, CAD, father with CAD Social history: Minimal to no smoking history, lives in [place] / Pulmonary Flowsheets:   ACT:      No data to display          MMRC:     No data to display  Epworth:      No data to display          Tests:   FENO:  No results found for: NITRICOXIDE  PFT:    Latest Ref Rng & Units 11/26/2016    8:36 AM  PFT Results  FVC-Pre L 4.80   FVC-Predicted Pre % 96   FVC-Post L 4.69   FVC-Predicted Post % 94   Pre FEV1/FVC % % 75   Post FEV1/FCV % % 82   FEV1-Pre L 3.59   FEV1-Predicted Pre % 98   FEV1-Post L 3.84   DLCO uncorrected ml/min/mmHg 30.77   DLCO UNC% % 81   DLVA Predicted % 87   TLC L 8.37   TLC % Predicted % 106   RV % Predicted % 118   Personally reviewed and interpreted as normal spirometry, normal lung volumes, DLCO within normal  limits  WALK:      No data to display          Imaging: Personally reviewed and as per EMR discussion of this note  Lab Results: Personally reviewed CBC    Component Value Date/Time   WBC 9.6 08/23/2022 1238   RBC 3.76 (L) 08/23/2022 1238   HGB 11.6 (L) 08/23/2022 1238   HGB 12.9 (L) 02/08/2017 1417   HCT 34.1 (L) 08/23/2022 1238   HCT 38.1 02/08/2017 1417   PLT 152 08/23/2022 1238   PLT 168 02/08/2017 1417   MCV 90.7 08/23/2022 1238   MCV 89 02/08/2017 1417   MCH 30.9 08/23/2022 1238   MCHC 34.0 08/23/2022 1238   RDW 13.0 08/23/2022 1238   RDW 13.0 02/08/2017 1417   LYMPHSABS 0.4 (L) 08/23/2022 1238   LYMPHSABS 1.5 02/08/2017 1417   MONOABS 0.6 08/23/2022 1238   EOSABS 0.0 08/23/2022 1238   EOSABS 0.1 02/08/2017 1417   BASOSABS 0.0 08/23/2022 1238   BASOSABS 0.0 02/08/2017 1417    BMET    Component Value Date/Time   NA 131 (L) 08/23/2022 1301   NA 141 02/08/2017 1417   K 4.3 08/23/2022 1301   CL 97 (L) 08/23/2022 1301   CO2 28 08/23/2022 1301   GLUCOSE 116 (H) 08/23/2022 1301   BUN 17 08/23/2022 1301   BUN 14 02/08/2017 1417   CREATININE 0.74 08/23/2022 1301   CALCIUM  10.5 (H) 08/23/2022 1301   CALCIUM  9.1 02/12/2021 0213   GFRNONAA >60 08/23/2022 1301   GFRAA >60 02/17/2017 0517    BNP    Component Value Date/Time   BNP 156.5 (H) 08/23/2022 1238    ProBNP No results found for: PROBNP  Specialty Problems       Pulmonary Problems   Acute upper respiratory infection   Bronchitis   Acute respiratory failure with hypoxia (HCC)   Multifocal pneumonia    Allergies  Allergen Reactions   Atorvastatin Palpitations   Ezetimibe Palpitations   Morphine  And Codeine Other (See Comments)    Pt has hypersensitivity to morphine - makes confused after prolonged usage    Immunization History  Administered Date(s) Administered   Fluad  Trivalent(High Dose 65+) 11/20/2022   PFIZER(Purple Top)SARS-COV-2 Vaccination 04/13/2019, 05/08/2019    Pfizer(Comirnaty )Fall Seasonal Vaccine 12 years and older 12/26/2021, 11/20/2022   Respiratory Syncytial Virus Vaccine ,Recomb Aduvanted(Arexvy ) 02/09/2022    Past Medical History:  Diagnosis Date   Arthritis    thumbs, back (02/16/2017)   Bradycardia 02/2017   Dyspnea    with exertion   Flu 2 weeks ago   GERD (gastroesophageal reflux disease)  Hepatitis A as child   HLD (hyperlipidemia)    Persistent atrial fibrillation (HCC) 12/18/2016   Pneumonia 1999; 2018   Presence of permanent cardiac pacemaker    st jude   Prostate cancer (HCC)     Tobacco History: Social History   Tobacco Use  Smoking Status Former   Current packs/day: 0.00   Average packs/day: 0.1 packs/day for 8.0 years (1.0 ttl pk-yrs)   Types: Cigarettes   Start date: 11/24/1957   Quit date: 11/24/1965   Years since quitting: 58.0  Smokeless Tobacco Never   Counseling given: Not Answered    Continue to not smoke  Outpatient Encounter Medications as of 11/25/2023  Medication Sig   acetaminophen  (TYLENOL ) 325 MG tablet Take 2 tablets (650 mg total) by mouth every 6 (six) hours.   albuterol  (VENTOLIN  HFA) 108 (90 Base) MCG/ACT inhaler Inhale 1-2 puffs into the lungs every 6 (six) hours as needed for wheezing or shortness of breath.   alendronate (FOSAMAX) 70 MG tablet Take 70 mg by mouth once a week.   calcium  carbonate (TUMS - DOSED IN MG ELEMENTAL CALCIUM ) 500 MG chewable tablet Chew 2 tablets by mouth daily as needed for indigestion or heartburn.   Cholecalciferol (VITAMIN D3) 125 MCG (5000 UT) CAPS 2 capsules   ELIQUIS  5 MG TABS tablet TAKE 1 TABLET(5 MG) BY MOUTH TWICE DAILY   lidocaine  (LIDODERM ) 5 % Place 2 patches onto the skin daily. 12 hrs on;12 hrs off- for R paraspinal/sciatic pain- Remove & Discard patch within 12 hours or as directed by MD   Lidocaine  5 % CREA    Multiple Vitamin (MULTIVITAMIN WITH MINERALS) TABS tablet Take 1 tablet by mouth daily.   Multiple Vitamins-Minerals (PRESERVISION  AREDS 2) CAPS 1 capsule   olmesartan (BENICAR) 20 MG tablet Take 20 mg by mouth daily.   pantoprazole  (PROTONIX ) 40 MG tablet Take 40 mg by mouth daily.   Polyethyl Glycol-Propyl Glycol (SYSTANE) 0.4-0.3 % GEL ophthalmic gel See admin instructions.   polyethylene glycol (MIRALAX  / GLYCOLAX ) 17 g packet Take 17 g by mouth daily.   Propylene Glycol (SYSTANE BALANCE OP) Place 1 drop into both eyes daily as needed (dry eyes).   psyllium (METAMUCIL) 58.6 % packet Take 1 packet by mouth daily.   rosuvastatin  (CRESTOR ) 10 MG tablet Take 10 mg by mouth every morning.   sodium chloride  (OCEAN) 0.65 % SOLN nasal spray Place 1 spray into both nostrils at bedtime.   trolamine salicylate (ASPERCREME) 10 % cream Apply 1 application  topically daily.   No facility-administered encounter medications on file as of 11/25/2023.     Review of Systems  Review of Systems  N/a Physical Exam  BP 130/80 (BP Location: Left Arm, Patient Position: Sitting, Cuff Size: Normal)   Pulse 63   Temp 98 F (36.7 C) (Oral)   Ht 5' 11 (1.803 m)   Wt 217 lb (98.4 kg)   SpO2 98%   BMI 30.27 kg/m   Wt Readings from Last 5 Encounters:  11/25/23 217 lb (98.4 kg)  11/05/23 217 lb (98.4 kg)  12/23/22 219 lb (99.3 kg)  10/06/22 223 lb 6.4 oz (101.3 kg)  10/06/22 221 lb 9.6 oz (100.5 kg)    BMI Readings from Last 5 Encounters:  11/25/23 30.27 kg/m  11/05/23 27.86 kg/m  12/23/22 28.12 kg/m  10/06/22 28.68 kg/m  10/06/22 28.45 kg/m     Physical Exam General: Well-appearing, no distress Eyes: EOMI, no icterus Neck: Supple, no JVD Pulmonary: Clear, normal  work of breathing Cardiovascular: Irregularly irregular, regular rate Abdomen: Nondistended, bowel sounds present MSK: No synovitis, no joint effusion Neuro: Normal gait, no weakness Psych: Normal, full affect   Assessment & Plan:   Improving right lung infiltrate: initially seen CT scan 05/2020 in the setting of clinical aspiration with his SBO and  vomiting.  To my eye serial improvement but persistence of right lung infiltrate on subsequent chest x-rays in the interim.  Suspect related to fibrosis from infection, aspiration.  Repeat CT chest 10/18/2020 with improved overall right lower lobe with newly discovered nodule scattered throughout the right most notably right middle lobe.  The appearance is atypical for malignancy.  Interval follow-up scan 12/2020 with further improvement of right lower lobe infiltrates as well as improvement in size of nodular right middle lobe infiltrates.  Subsequent improvement on CT scan 07/2022.  High suspicion are related to massive aspiration event during hospitalization 05/2020.  No need for further evaluation.  Dyspnea on exertion: Suspect related deconditioning after radiation and hospitalization in March 2022.  Was feeling better after aspiration hospitalization later in 2022.  PFTs in 2018 normal.  I suspect there is no significant lung disease underlying his symptoms.  We discussed repeating these in the future.  Consideration of inhalers if there are signs of bronchodilator spots, air trapping or hyperinflation that would respond to the inhalers theoretically.  He prefers to work on strength and conditioning.  I think this is great.  Most likely thing to benefit him would be increasing exercise tolerance, strength building etc.   Return if symptoms worsen or fail to improve.   Donnice JONELLE Beals, MD 11/25/2023   I spent 41 minutes in care of the patient this encounter including face-to-face visit, review of records, coordination of care.

## 2023-11-29 ENCOUNTER — Other Ambulatory Visit: Payer: Self-pay | Admitting: Physical Medicine and Rehabilitation

## 2023-12-07 DIAGNOSIS — E785 Hyperlipidemia, unspecified: Secondary | ICD-10-CM | POA: Diagnosis not present

## 2023-12-07 DIAGNOSIS — I4891 Unspecified atrial fibrillation: Secondary | ICD-10-CM | POA: Diagnosis not present

## 2023-12-07 DIAGNOSIS — J40 Bronchitis, not specified as acute or chronic: Secondary | ICD-10-CM | POA: Diagnosis not present

## 2023-12-07 DIAGNOSIS — E7800 Pure hypercholesterolemia, unspecified: Secondary | ICD-10-CM | POA: Diagnosis not present

## 2023-12-07 DIAGNOSIS — I10 Essential (primary) hypertension: Secondary | ICD-10-CM | POA: Diagnosis not present

## 2023-12-09 ENCOUNTER — Telehealth: Payer: Self-pay

## 2023-12-09 NOTE — Telephone Encounter (Signed)
 Approved today by [facility] Medicare 2017 Approved. This drug has been approved under the Member's Medicare Part D benefit. Approved quantity: 30 units per 15 day(s). You may fill up to a 90 day supply except for those on Specialty Tier 5, which can be filled up to a 30 day supply. Please call the pharmacy to process the prescription claim. Effective Date: 11/25/2023 Authorization Expiration Date: 03/08/2098

## 2023-12-09 NOTE — Telephone Encounter (Signed)
(  KeyBETHA SOLES) PA Case ID #: [id number] Rx #: [id number] Need Help? Call us  at [phone] Status sent iconSent to Plan today Drug Lidocaine  5% patches ePA cloud logo Form [facility] Medicare Electronic Prior Authorization Request Form [phone] NCPDP) Original Claim Info

## 2023-12-30 ENCOUNTER — Other Ambulatory Visit: Payer: Self-pay | Admitting: Cardiology

## 2023-12-30 DIAGNOSIS — I4819 Other persistent atrial fibrillation: Secondary | ICD-10-CM

## 2023-12-31 NOTE — Telephone Encounter (Signed)
 Eliquis  5mg  refill request received. Patient is 77 years old, weight-98.4kg, Crea-0.73 on 07/19/23 via [facility] from [place], [place], and last seen by Charlies Arthur on 11/05/23. Last Sent 07/05/23. Dose is appropriate based on dosing criteria. Will send in refill to requested pharmacy.

## 2024-01-05 DIAGNOSIS — M170 Bilateral primary osteoarthritis of knee: Secondary | ICD-10-CM | POA: Diagnosis not present

## 2024-01-05 DIAGNOSIS — M25561 Pain in right knee: Secondary | ICD-10-CM | POA: Diagnosis not present

## 2024-01-05 DIAGNOSIS — M1712 Unilateral primary osteoarthritis, left knee: Secondary | ICD-10-CM | POA: Diagnosis not present

## 2024-01-05 DIAGNOSIS — M1711 Unilateral primary osteoarthritis, right knee: Secondary | ICD-10-CM | POA: Diagnosis not present

## 2024-01-05 DIAGNOSIS — M25562 Pain in left knee: Secondary | ICD-10-CM | POA: Diagnosis not present

## 2024-01-07 DIAGNOSIS — E7800 Pure hypercholesterolemia, unspecified: Secondary | ICD-10-CM | POA: Diagnosis not present

## 2024-01-07 DIAGNOSIS — I4891 Unspecified atrial fibrillation: Secondary | ICD-10-CM | POA: Diagnosis not present

## 2024-01-07 DIAGNOSIS — J40 Bronchitis, not specified as acute or chronic: Secondary | ICD-10-CM | POA: Diagnosis not present

## 2024-01-07 DIAGNOSIS — E785 Hyperlipidemia, unspecified: Secondary | ICD-10-CM | POA: Diagnosis not present

## 2024-01-07 DIAGNOSIS — I10 Essential (primary) hypertension: Secondary | ICD-10-CM | POA: Diagnosis not present

## 2024-01-19 DIAGNOSIS — Z23 Encounter for immunization: Secondary | ICD-10-CM | POA: Diagnosis not present

## 2024-01-19 DIAGNOSIS — I4891 Unspecified atrial fibrillation: Secondary | ICD-10-CM | POA: Diagnosis not present

## 2024-01-19 DIAGNOSIS — I10 Essential (primary) hypertension: Secondary | ICD-10-CM | POA: Diagnosis not present

## 2024-01-19 DIAGNOSIS — B353 Tinea pedis: Secondary | ICD-10-CM | POA: Diagnosis not present

## 2024-01-19 DIAGNOSIS — A6000 Herpesviral infection of urogenital system, unspecified: Secondary | ICD-10-CM | POA: Diagnosis not present

## 2024-01-19 DIAGNOSIS — G8929 Other chronic pain: Secondary | ICD-10-CM | POA: Diagnosis not present

## 2024-01-19 DIAGNOSIS — M1711 Unilateral primary osteoarthritis, right knee: Secondary | ICD-10-CM | POA: Diagnosis not present

## 2024-01-19 DIAGNOSIS — J479 Bronchiectasis, uncomplicated: Secondary | ICD-10-CM | POA: Diagnosis not present

## 2024-01-19 DIAGNOSIS — R0602 Shortness of breath: Secondary | ICD-10-CM | POA: Diagnosis not present

## 2024-01-19 DIAGNOSIS — E7800 Pure hypercholesterolemia, unspecified: Secondary | ICD-10-CM | POA: Diagnosis not present

## 2024-01-19 DIAGNOSIS — M8000XS Age-related osteoporosis with current pathological fracture, unspecified site, sequela: Secondary | ICD-10-CM | POA: Diagnosis not present

## 2024-01-19 DIAGNOSIS — D6869 Other thrombophilia: Secondary | ICD-10-CM | POA: Diagnosis not present

## 2024-02-06 DIAGNOSIS — E7800 Pure hypercholesterolemia, unspecified: Secondary | ICD-10-CM | POA: Diagnosis not present

## 2024-02-06 DIAGNOSIS — E785 Hyperlipidemia, unspecified: Secondary | ICD-10-CM | POA: Diagnosis not present

## 2024-02-06 DIAGNOSIS — I10 Essential (primary) hypertension: Secondary | ICD-10-CM | POA: Diagnosis not present

## 2024-02-06 DIAGNOSIS — I4891 Unspecified atrial fibrillation: Secondary | ICD-10-CM | POA: Diagnosis not present

## 2024-02-06 DIAGNOSIS — J40 Bronchitis, not specified as acute or chronic: Secondary | ICD-10-CM | POA: Diagnosis not present

## 2024-02-08 ENCOUNTER — Ambulatory Visit: Payer: Medicare Other

## 2024-02-08 DIAGNOSIS — I495 Sick sinus syndrome: Secondary | ICD-10-CM

## 2024-02-09 LAB — CUP PACEART REMOTE DEVICE CHECK
Battery Remaining Longevity: 55 mo
Battery Remaining Percentage: 42 %
Battery Voltage: 2.99 V
Brady Statistic RV Percent Paced: 29 %
Date Time Interrogation Session: 20251202231409
Implantable Lead Connection Status: 753985
Implantable Lead Connection Status: 753985
Implantable Lead Implant Date: 20181211
Implantable Lead Implant Date: 20181211
Implantable Lead Location: 753859
Implantable Lead Location: 753860
Implantable Pulse Generator Implant Date: 20181211
Lead Channel Impedance Value: 360 Ohm
Lead Channel Pacing Threshold Amplitude: 1 V
Lead Channel Pacing Threshold Pulse Width: 0.5 ms
Lead Channel Sensing Intrinsic Amplitude: 6.3 mV
Lead Channel Setting Pacing Amplitude: 2.5 V
Lead Channel Setting Pacing Pulse Width: 0.5 ms
Lead Channel Setting Sensing Sensitivity: 2 mV
Pulse Gen Model: 2272
Pulse Gen Serial Number: 8967264

## 2024-02-10 ENCOUNTER — Ambulatory Visit: Payer: Self-pay | Admitting: Cardiology

## 2024-02-10 DIAGNOSIS — M170 Bilateral primary osteoarthritis of knee: Secondary | ICD-10-CM | POA: Diagnosis not present

## 2024-02-11 NOTE — Progress Notes (Signed)
 Remote PPM Transmission

## 2024-02-14 ENCOUNTER — Telehealth (HOSPITAL_BASED_OUTPATIENT_CLINIC_OR_DEPARTMENT_OTHER): Payer: Self-pay

## 2024-02-14 NOTE — Telephone Encounter (Addendum)
   Pre-operative Risk Assessment    Patient Name: Todd Chapman  DOB: 1945-09-12 MRN: [id number]   Date of last office visit: 11/05/23 with leverne Date of next office visit: NA  Request for Surgical Clearance    Procedure:  Epidural steroid injection, lumbar   Date of Surgery:  Clearance TBD                                 Surgeon:  Dr. Laqueta Socks Group or Practice Name:  [facility] Phone number:  [phone] Fax number:  [phone]   Type of Clearance Requested:   - Medical  - Pharmacy:  Hold Apixaban  (Eliquis ) for 3 days prior   Type of Anesthesia:  Not Indicated   Additional requests/questions:    [name]   02/14/2024, 3:30 PM

## 2024-02-15 NOTE — Telephone Encounter (Signed)
 Patient with diagnosis of afib on Eliquis  for anticoagulation.    Procedure:  Epidural steroid injection, lumbar    Date of Surgery:  Clearance TBD     CHA2DS2-VASc Score = 2   This indicates a 2.2% annual risk of stroke. The patient's score is based upon: CHF History: 0 HTN History: 0 Diabetes History: 0 Stroke History: 0 Vascular Disease History: 0 Age Score: 2 Gender Score: 0   (labs from [facility] 07/2023) CrCl 97 ml/min  Platelet count 163 K  Patient has not had an Afib/aflutter ablation in the last 3 months, DCCV within the last 4 weeks or a watchman implanted in the last 45 days   Per office protocol, patient can hold Eliquis  for 3 days prior to procedure.   Patient will not need bridging with Lovenox  (enoxaparin ) around procedure.  **This guidance is not considered finalized until pre-operative APP has relayed final recommendations.**

## 2024-02-15 NOTE — Telephone Encounter (Signed)
   Name: Todd Chapman  DOB: 07/27/1945  MRN: [id number]  Primary Cardiologist: None  Preoperative team, please contact this patient and set up a phone call appointment for further preoperative risk assessment. Please obtain consent and complete medication review. Thank you for your help.  I confirm that guidance regarding antiplatelet and oral anticoagulation therapy has been completed and, if necessary, noted below.  Per office protocol and PharmD, patient can hold Eliquis  for 3 days prior to procedure.   Patient will not need bridging with Lovenox  (enoxaparin ) around procedure.  I also confirmed the patient resides in the state of [place] . As per [facility] Medical Board telemedicine laws, the patient must reside in the state in which the provider is licensed.   Todd Magnan D Ahilyn Nell, NP 02/15/2024, 2:53 PM [facility]

## 2024-02-15 NOTE — Telephone Encounter (Signed)
 Someone picked up the phone but did not say anything. I kept calling out the pt's name but no response.

## 2024-02-17 ENCOUNTER — Telehealth: Payer: Self-pay

## 2024-02-17 NOTE — Telephone Encounter (Signed)
 Pt scheduled for VV on 12/18

## 2024-02-17 NOTE — Telephone Encounter (Signed)
°  Patient Consent for Virtual Visit        Todd Chapman has provided verbal consent on 02/17/2024 for a virtual visit (video or telephone).   CONSENT FOR VIRTUAL VISIT FOR:  Todd Chapman  By participating in this virtual visit I agree to the following:  I hereby voluntarily request, consent and authorize [facility] and its employed or contracted physicians, physician assistants, nurse practitioners or other licensed health care professionals (the Practitioner), to provide me with telemedicine health care services (the Services) as deemed necessary by the treating Practitioner. I acknowledge and consent to receive the Services by the Practitioner via telemedicine. I understand that the telemedicine visit will involve communicating with the Practitioner through live audiovisual communication technology and the disclosure of certain medical information by electronic transmission. I acknowledge that I have been given the opportunity to request an in-person assessment or other available alternative prior to the telemedicine visit and am voluntarily participating in the telemedicine visit.  I understand that I have the right to withhold or withdraw my consent to the use of telemedicine in the course of my care at any time, without affecting my right to future care or treatment, and that the Practitioner or I may terminate the telemedicine visit at any time. I understand that I have the right to inspect all information obtained and/or recorded in the course of the telemedicine visit and may receive copies of available information for a reasonable fee.  I understand that some of the potential risks of receiving the Services via telemedicine include:  Delay or interruption in medical evaluation due to technological equipment failure or disruption; Information transmitted may not be sufficient (e.g. poor resolution of images) to allow for appropriate medical decision making by the  Practitioner; and/or  In rare instances, security protocols could fail, causing a breach of personal health information.  Furthermore, I acknowledge that it is my responsibility to provide information about my medical history, conditions and care that is complete and accurate to the best of my ability. I acknowledge that Practitioner's advice, recommendations, and/or decision may be based on factors not within their control, such as incomplete or inaccurate data provided by me or distortions of diagnostic images or specimens that may result from electronic transmissions. I understand that the practice of medicine is not an exact science and that Practitioner makes no warranties or guarantees regarding treatment outcomes. I acknowledge that a copy of this consent can be made available to me via my patient portal [facility] MyChart), or I can request a printed copy by calling the office of [facility].    I understand that my insurance will be billed for this visit.   I have read or had this consent read to me. I understand the contents of this consent, which adequately explains the benefits and risks of the Services being provided via telemedicine.  I have been provided ample opportunity to ask questions regarding this consent and the Services and have had my questions answered to my satisfaction. I give my informed consent for the services to be provided through the use of telemedicine in my medical care

## 2024-02-24 ENCOUNTER — Ambulatory Visit: Attending: Internal Medicine

## 2024-02-24 DIAGNOSIS — Z01810 Encounter for preprocedural cardiovascular examination: Secondary | ICD-10-CM

## 2024-02-24 NOTE — Progress Notes (Signed)
 Virtual Visit via Telephone Note   Because of [name] co-morbid illnesses, he is at least at moderate risk for complications without adequate follow up.  This format is felt to be most appropriate for this patient at this time.  Due to technical limitations with video connection (technology), today's appointment will be conducted as an audio only telehealth visit, and [name] verbally agreed to proceed in this manner.   All issues noted in this document were discussed and addressed.  No physical exam could be performed with this format.  Evaluation Performed:  Preoperative cardiovascular risk assessment _____________   Date:  02/24/2024   Patient ID:  Todd Chapman, DOB 19-Nov-1945, MRN [id number] Patient Location:  Home Provider location:   Office  Primary Care Provider:  Charlott Dorn LABOR, MD Primary Cardiologist:  None  Chief Complaint / Patient Profile   78 y.o. y/o male with a h/o GERD, HLD, Afib, sick sinus syndrome s/p PPM, who is pending epidural steroid injection, lumbar scheduled for TBD and presents today for telephonic preoperative cardiovascular risk assessment.  History of Present Illness    Todd Chapman is a 78 y.o. male who presents via audio/video conferencing for a telehealth visit today.  Pt was last seen in cardiology clinic on 11/05/2023 by Charlies Arthur PA-C.  At that time Todd Chapman was doing well .  The patient is now pending procedure as outlined above. Since his last visit, he's been stable. He denies chest pain, palpitations, orthopnea, n, v,  dark/tarry/bloody stools, hematuria, dizziness, syncope, weight gain. He does describe some chronic DOE and bilateral LE edema that resolves with leg elevation. States these problems have continued over years, no change to baseline. No abnormalities reported on recent device check, echo 10/2022 EF 60-65%, mild LVH.   Past Medical History    Past Medical History:  Diagnosis Date   Arthritis     thumbs, back (02/16/2017)   Bradycardia 02/2017   Dyspnea    with exertion   Flu 2 weeks ago   GERD (gastroesophageal reflux disease)    Hepatitis A as child   HLD (hyperlipidemia)    Persistent atrial fibrillation (HCC) 12/18/2016   Pneumonia 1999; 2018   Presence of permanent cardiac pacemaker    st jude   Prostate cancer [facility])    Past Surgical History:  Procedure Laterality Date   APPENDECTOMY     age 82   CARDIOVERSION  02/16/2017   CARDIOVERSION N/A 02/16/2017   Procedure: CARDIOVERSION;  Surgeon: Kelsie Agent, MD;  Location: [facility]VASIVE [facility] LAB;  Service: Cardiovascular;  Laterality: N/A;   COLONOSCOPY WITH PROPOFOL  N/A 12/13/2012   Procedure: COLONOSCOPY WITH PROPOFOL ;  Surgeon: Gladis MARLA Louder, MD;  Location: [facility] ENDOSCOPY;  Service: Endoscopy;  Laterality: N/A;   FRACTURE SURGERY     HYDROCELE EXCISION Left 05/16/2018   Procedure: HYDROCELECTOMY ADULT;  Surgeon: Matilda Senior, MD;  Location[facility];  Service: Urology;  Laterality: Left;   INSERT / REPLACE / REMOVE PACEMAKER  02/16/2017   KYPHOPLASTY N/A 02/10/2021   Procedure: KYPHOPLASTY L-5;  Surgeon: Mavis Purchase, MD;  Location: [facility] OR;  Service: Neurosurgery;  Laterality: N/A;   LAPAROSCOPY N/A 06/04/2020   Procedure: POSSIBLE LAPAROTOMY POSSIBLE BOWEL RESSECTION;  Surgeon: Signe Mitzie LABOR, MD;  Location: [facility] ORS;  Service: General;  Laterality: N/A;   PACEMAKER IMPLANT N/A 02/16/2017   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 by Dr Kelsie  PROSTATE BIOPSY  11/23/2019   WRIST FRACTURE SURGERY Left    with pins    Allergies  Allergies[1]  Home Medications    Prior to Admission medications  Medication Sig Start Date End Date Taking? Authorizing Provider  acetaminophen  (TYLENOL ) 325 MG tablet Take 2 tablets (650 mg total) by mouth every 6 (six) hours. 02/21/21   Setzer, Sandra J, PA-C  albuterol  (VENTOLIN  HFA) 108 (90 Base)  MCG/ACT inhaler Inhale 1-2 puffs into the lungs every 6 (six) hours as needed for wheezing or shortness of breath. 08/23/22   Lawsing, James, MD  alendronate (FOSAMAX) 70 MG tablet Take 70 mg by mouth once a week.    [provider]  apixaban  (ELIQUIS ) 5 MG TABS tablet TAKE 1 TABLET(5 MG) BY MOUTH TWICE DAILY 12/31/23   Camnitz, Soyla Lunger, MD  calcium  carbonate (TUMS - DOSED IN MG ELEMENTAL CALCIUM ) 500 MG chewable tablet Chew 2 tablets by mouth daily as needed for indigestion or heartburn.    [provider]  Cholecalciferol (VITAMIN D3) 125 MCG (5000 UT) CAPS 2 capsules    [provider]  lidocaine  (LIDODERM ) 5 % PLACE 2 PATCHED ONTO THE SKIN ONCE DAILY. 12 HOURS ON AND 12 HOURS OFF. 12/07/23   Lovorn, Duwaine, MD  Lidocaine  5 % CREA     [provider]  Multiple Vitamin (MULTIVITAMIN WITH MINERALS) TABS tablet Take 1 tablet by mouth daily.    [provider]  Multiple Vitamins-Minerals (PRESERVISION AREDS 2) CAPS 1 capsule    [provider]  olmesartan (BENICAR) 20 MG tablet Take 20 mg by mouth daily. 10/27/21   [provider]  pantoprazole  (PROTONIX ) 40 MG tablet Take 40 mg by mouth daily. 10/16/16   [provider]  Polyethyl Glycol-Propyl Glycol (SYSTANE) 0.4-0.3 % GEL ophthalmic gel See admin instructions.    [provider]  polyethylene glycol (MIRALAX  / GLYCOLAX ) 17 g packet Take 17 g by mouth daily. 02/21/21   Setzer, Sandra J, PA-C  Propylene Glycol (SYSTANE BALANCE OP) Place 1 drop into both eyes daily as needed (dry eyes).    [provider]  psyllium (METAMUCIL) 58.6 % packet Take 1 packet by mouth daily.    [provider]  rosuvastatin  (CRESTOR ) 10 MG tablet Take 10 mg by mouth every morning.    [provider]  sodium chloride  (OCEAN) 0.65 % SOLN nasal spray Place 1 spray into both nostrils at bedtime.    [provider]  trolamine salicylate (ASPERCREME) 10 % cream  Apply 1 application  topically daily.    [provider]    Physical Exam    Vital Signs:  Todd Chapman does not have vital signs available for review today.  Given telephonic nature of communication, physical exam is limited. AAOx3. NAD. Normal affect.  Speech and respirations are unlabored.  Accessory Clinical Findings    None  Assessment & Plan    1.  Preoperative Cardiovascular Risk Assessment: According to the Revised Cardiac Risk Index (RCRI), his Perioperative Risk of Major Cardiac Event is (%): 0.4  His Functional Capacity in METs is: 5.07 according to the Duke Activity Status Index (DASI). Therefore, based on ACC/AHA guidelines, patient would be at acceptable risk for the planned procedure without further cardiovascular testing.   The patient was advised that if he develops new symptoms prior to surgery to contact our office to arrange for a follow-up visit, and he verbalized understanding.  Per office protocol and PharmD, patient can hold Eliquis   for 3 days prior to procedure.   Patient will not need bridging with Lovenox  (enoxaparin ) around procedure.  A copy of this note will be routed to requesting surgeon.  Time:   Today, I have spent 10 minutes with the patient with telehealth technology discussing medical history, symptoms, and management plan.     Gabreille Dardis E Ariel Wingrove, NP  02/24/2024, 11:41 AM     [1]  Allergies Allergen Reactions   Atorvastatin Palpitations   Ezetimibe Palpitations   Morphine  And Codeine Other (See Comments)    Pt has hypersensitivity to morphine - makes confused after prolonged usage

## 2024-02-25 ENCOUNTER — Telehealth: Payer: Self-pay | Admitting: Cardiology

## 2024-02-25 NOTE — Telephone Encounter (Signed)
 Spoke to Todd Chapman at tech services who advised patient is fine to use as long as he keeps the vibrating pad greater than 12 inches from his pacemaker. Patient voiced understanding and was appreciative of call.

## 2024-02-25 NOTE — Telephone Encounter (Signed)
 Patient wants to know if he can use a vibration plate with his current pacemaker.

## 2024-03-07 ENCOUNTER — Encounter (HOSPITAL_BASED_OUTPATIENT_CLINIC_OR_DEPARTMENT_OTHER): Payer: Self-pay | Admitting: Emergency Medicine

## 2024-03-07 ENCOUNTER — Emergency Department (HOSPITAL_BASED_OUTPATIENT_CLINIC_OR_DEPARTMENT_OTHER)
Admission: EM | Admit: 2024-03-07 | Discharge: 2024-03-07 | Discharge status: Against Medical Advice | Source: Ambulatory Visit | Attending: Emergency Medicine | Admitting: Emergency Medicine

## 2024-03-07 ENCOUNTER — Other Ambulatory Visit: Payer: Self-pay

## 2024-03-07 DIAGNOSIS — Z5321 Procedure and treatment not carried out due to patient leaving prior to being seen by health care provider: Secondary | ICD-10-CM | POA: Insufficient documentation

## 2024-03-07 DIAGNOSIS — R519 Headache, unspecified: Secondary | ICD-10-CM | POA: Insufficient documentation

## 2024-03-07 DIAGNOSIS — R5383 Other fatigue: Secondary | ICD-10-CM | POA: Insufficient documentation

## 2024-03-07 NOTE — ED Triage Notes (Signed)
 Reports drinking too much alcohol  yesterday. Endorses headache, and fatigue.

## 2024-03-30 ENCOUNTER — Inpatient Hospital Stay (HOSPITAL_COMMUNITY)

## 2024-03-30 ENCOUNTER — Encounter (HOSPITAL_COMMUNITY): Payer: Self-pay

## 2024-03-30 ENCOUNTER — Emergency Department (HOSPITAL_COMMUNITY)

## 2024-03-30 ENCOUNTER — Inpatient Hospital Stay (HOSPITAL_COMMUNITY): Admitting: Anesthesiology

## 2024-03-30 ENCOUNTER — Encounter (HOSPITAL_COMMUNITY)
Admission: EM | Disposition: A | Discharge status: Skilled Nursing Facility | Payer: Self-pay | Source: Home / Self Care | Attending: Internal Medicine

## 2024-03-30 ENCOUNTER — Ambulatory Visit: Payer: Self-pay | Admitting: Student

## 2024-03-30 ENCOUNTER — Other Ambulatory Visit: Payer: Self-pay

## 2024-03-30 ENCOUNTER — Inpatient Hospital Stay (HOSPITAL_COMMUNITY)
Admission: EM | Admit: 2024-03-30 | Discharge: 2024-04-04 | DRG: 480 | Disposition: A | Discharge status: Skilled Nursing Facility | Attending: Internal Medicine | Admitting: Internal Medicine

## 2024-03-30 DIAGNOSIS — R0902 Hypoxemia: Secondary | ICD-10-CM | POA: Diagnosis present

## 2024-03-30 DIAGNOSIS — S72001A Fracture of unspecified part of neck of right femur, initial encounter for closed fracture: Secondary | ICD-10-CM | POA: Diagnosis present

## 2024-03-30 DIAGNOSIS — E785 Hyperlipidemia, unspecified: Secondary | ICD-10-CM

## 2024-03-30 DIAGNOSIS — K219 Gastro-esophageal reflux disease without esophagitis: Secondary | ICD-10-CM | POA: Diagnosis present

## 2024-03-30 DIAGNOSIS — M461 Sacroiliitis, not elsewhere classified: Secondary | ICD-10-CM | POA: Diagnosis present

## 2024-03-30 DIAGNOSIS — M5430 Sciatica, unspecified side: Secondary | ICD-10-CM | POA: Insufficient documentation

## 2024-03-30 DIAGNOSIS — S72141A Displaced intertrochanteric fracture of right femur, initial encounter for closed fracture: Principal | ICD-10-CM | POA: Diagnosis present

## 2024-03-30 DIAGNOSIS — Z8249 Family history of ischemic heart disease and other diseases of the circulatory system: Secondary | ICD-10-CM

## 2024-03-30 DIAGNOSIS — J189 Pneumonia, unspecified organism: Secondary | ICD-10-CM | POA: Diagnosis present

## 2024-03-30 DIAGNOSIS — Z885 Allergy status to narcotic agent status: Secondary | ICD-10-CM

## 2024-03-30 DIAGNOSIS — Z8546 Personal history of malignant neoplasm of prostate: Secondary | ICD-10-CM

## 2024-03-30 DIAGNOSIS — D62 Acute posthemorrhagic anemia: Secondary | ICD-10-CM | POA: Diagnosis not present

## 2024-03-30 DIAGNOSIS — F1010 Alcohol abuse, uncomplicated: Secondary | ICD-10-CM | POA: Diagnosis present

## 2024-03-30 DIAGNOSIS — B009 Herpesviral infection, unspecified: Secondary | ICD-10-CM | POA: Insufficient documentation

## 2024-03-30 DIAGNOSIS — I4819 Other persistent atrial fibrillation: Secondary | ICD-10-CM | POA: Diagnosis present

## 2024-03-30 DIAGNOSIS — Z87891 Personal history of nicotine dependence: Secondary | ICD-10-CM | POA: Diagnosis not present

## 2024-03-30 DIAGNOSIS — Z7983 Long term (current) use of bisphosphonates: Secondary | ICD-10-CM | POA: Diagnosis not present

## 2024-03-30 DIAGNOSIS — R21 Rash and other nonspecific skin eruption: Secondary | ICD-10-CM | POA: Diagnosis present

## 2024-03-30 DIAGNOSIS — G8929 Other chronic pain: Secondary | ICD-10-CM | POA: Diagnosis present

## 2024-03-30 DIAGNOSIS — Z888 Allergy status to other drugs, medicaments and biological substances status: Secondary | ICD-10-CM

## 2024-03-30 DIAGNOSIS — K5900 Constipation, unspecified: Secondary | ICD-10-CM | POA: Diagnosis present

## 2024-03-30 DIAGNOSIS — Z950 Presence of cardiac pacemaker: Secondary | ICD-10-CM

## 2024-03-30 DIAGNOSIS — Y905 Blood alcohol level of 100-119 mg/100 ml: Secondary | ICD-10-CM | POA: Diagnosis present

## 2024-03-30 DIAGNOSIS — Z8701 Personal history of pneumonia (recurrent): Secondary | ICD-10-CM

## 2024-03-30 DIAGNOSIS — Z823 Family history of stroke: Secondary | ICD-10-CM

## 2024-03-30 DIAGNOSIS — E8770 Fluid overload, unspecified: Secondary | ICD-10-CM | POA: Diagnosis not present

## 2024-03-30 DIAGNOSIS — Z79899 Other long term (current) drug therapy: Secondary | ICD-10-CM | POA: Diagnosis not present

## 2024-03-30 DIAGNOSIS — E559 Vitamin D deficiency, unspecified: Secondary | ICD-10-CM | POA: Diagnosis present

## 2024-03-30 DIAGNOSIS — M810 Age-related osteoporosis without current pathological fracture: Secondary | ICD-10-CM | POA: Diagnosis present

## 2024-03-30 DIAGNOSIS — E871 Hypo-osmolality and hyponatremia: Secondary | ICD-10-CM | POA: Diagnosis present

## 2024-03-30 DIAGNOSIS — Z800 Family history of malignant neoplasm of digestive organs: Secondary | ICD-10-CM

## 2024-03-30 DIAGNOSIS — Z7901 Long term (current) use of anticoagulants: Secondary | ICD-10-CM | POA: Diagnosis not present

## 2024-03-30 DIAGNOSIS — R918 Other nonspecific abnormal finding of lung field: Secondary | ICD-10-CM | POA: Diagnosis present

## 2024-03-30 LAB — ETHANOL: Alcohol, Ethyl (B): 103 mg/dL — ABNORMAL HIGH

## 2024-03-30 LAB — BASIC METABOLIC PANEL WITH GFR
Anion gap: 10 (ref 5–15)
BUN: 15 mg/dL (ref 8–23)
CO2: 24 mmol/L (ref 22–32)
Calcium: 9.9 mg/dL (ref 8.9–10.3)
Chloride: 100 mmol/L (ref 98–111)
Creatinine, Ser: 0.83 mg/dL (ref 0.61–1.24)
GFR, Estimated: >60 mL/min
Glucose, Bld: 127 mg/dL — ABNORMAL HIGH (ref 70–99)
Potassium: 4 mmol/L (ref 3.5–5.1)
Sodium: 135 mmol/L (ref 135–145)

## 2024-03-30 LAB — CBC WITH DIFFERENTIAL/PLATELET
Abs Immature Granulocytes: 0.03 K/uL (ref 0.00–0.07)
Basophils Absolute: 0 K/uL (ref 0.0–0.1)
Basophils Relative: 0 %
Eosinophils Absolute: 0.1 K/uL (ref 0.0–0.5)
Eosinophils Relative: 2 %
HCT: 31.7 % — ABNORMAL LOW (ref 39.0–52.0)
Hemoglobin: 10.2 g/dL — ABNORMAL LOW (ref 13.0–17.0)
Immature Granulocytes: 1 %
Lymphocytes Relative: 16 %
Lymphs Abs: 0.8 K/uL (ref 0.7–4.0)
MCH: 30.3 pg (ref 26.0–34.0)
MCHC: 32.2 g/dL (ref 30.0–36.0)
MCV: 94.1 fL (ref 80.0–100.0)
Monocytes Absolute: 0.5 K/uL (ref 0.1–1.0)
Monocytes Relative: 10 %
Neutro Abs: 3.8 K/uL (ref 1.7–7.7)
Neutrophils Relative %: 71 %
Platelets: 193 K/uL (ref 150–400)
RBC: 3.37 MIL/uL — ABNORMAL LOW (ref 4.22–5.81)
RDW: 14 % (ref 11.5–15.5)
WBC: 5.3 K/uL (ref 4.0–10.5)
nRBC: 0 % (ref 0.0–0.2)

## 2024-03-30 LAB — PROTIME-INR
INR: 1.1 (ref 0.8–1.2)
Prothrombin Time: 14.4 s (ref 11.4–15.2)

## 2024-03-30 LAB — TYPE AND SCREEN
ABO/RH(D): O POS
Antibody Screen: NEGATIVE

## 2024-03-30 MED ORDER — HYDROMORPHONE HCL 1 MG/ML IJ SOLN: 0.5000 mg | INTRAMUSCULAR | Status: DC | PRN

## 2024-03-30 MED ORDER — BISACODYL 10 MG RE SUPP: 10.0000 mg | Freq: Every day | RECTAL | Status: DC | PRN

## 2024-03-30 MED ORDER — MENTHOL 3 MG MT LOZG: 1.0000 | LOZENGE | OROMUCOSAL | Status: DC | PRN

## 2024-03-30 MED ORDER — CHLORHEXIDINE GLUCONATE 4 % EX SOLN: 60.0000 mL | Freq: Once | CUTANEOUS | Status: DC

## 2024-03-30 MED ORDER — CEFAZOLIN SODIUM-DEXTROSE 2-4 GM/100ML-% IV SOLN
2.0000 g | Freq: Four times a day (QID) | INTRAVENOUS | Status: AC
  Administered 2024-03-30 – 2024-03-31 (×2): 2 g via INTRAVENOUS
  Filled 2024-03-30 (×2): qty 100

## 2024-03-30 MED ORDER — METOCLOPRAMIDE HCL 5 MG/ML IJ SOLN: 5.0000 mg | Freq: Three times a day (TID) | INTRAMUSCULAR | Status: DC | PRN

## 2024-03-30 MED ORDER — ISOPROPYL ALCOHOL 70 % SOLN
Status: AC
  Filled 2024-03-30: qty 480

## 2024-03-30 MED ORDER — HYDROMORPHONE HCL 1 MG/ML IJ SOLN
INTRAMUSCULAR | Status: AC
  Filled 2024-03-30: qty 1

## 2024-03-30 MED ORDER — CHLORHEXIDINE GLUCONATE 0.12 % MT SOLN
15.0000 mL | Freq: Once | OROMUCOSAL | Status: AC
  Administered 2024-03-30: 15 mL via OROMUCOSAL

## 2024-03-30 MED ORDER — OXYCODONE HCL 5 MG PO TABS
5.0000 mg | ORAL_TABLET | ORAL | Status: DC | PRN
  Filled 2024-03-30: qty 1

## 2024-03-30 MED ORDER — ALBUMIN HUMAN 5 % IV SOLN: INTRAVENOUS | Status: DC | PRN

## 2024-03-30 MED ORDER — OXYCODONE HCL 5 MG PO TABS: 5.0000 mg | ORAL_TABLET | Freq: Once | ORAL | Status: DC | PRN

## 2024-03-30 MED ORDER — HYDROMORPHONE HCL 1 MG/ML IJ SOLN
0.7500 mg | Freq: Once | INTRAMUSCULAR | Status: AC
  Administered 2024-03-30: 0.75 mg via INTRAVENOUS
  Filled 2024-03-30: qty 1

## 2024-03-30 MED ORDER — FENTANYL CITRATE (PF) 50 MCG/ML IJ SOSY
50.0000 ug | PREFILLED_SYRINGE | Freq: Once | INTRAMUSCULAR | Status: AC
  Administered 2024-03-30: 50 ug via INTRAVENOUS
  Filled 2024-03-30: qty 1

## 2024-03-30 MED ORDER — LACTATED RINGERS IV SOLN: INTRAVENOUS | Status: DC

## 2024-03-30 MED ORDER — POLYETHYLENE GLYCOL 3350 17 G PO PACK
17.0000 g | PACK | Freq: Every day | ORAL | Status: DC | PRN
  Administered 2024-04-03: 17 g via ORAL
  Filled 2024-03-30: qty 1

## 2024-03-30 MED ORDER — ACETAMINOPHEN 500 MG PO TABS
1000.0000 mg | ORAL_TABLET | Freq: Once | ORAL | Status: DC
  Filled 2024-03-30: qty 2

## 2024-03-30 MED ORDER — ONDANSETRON HCL 4 MG/2ML IJ SOLN: 4.0000 mg | Freq: Once | INTRAMUSCULAR | Status: DC | PRN

## 2024-03-30 MED ORDER — ROCURONIUM BROMIDE 100 MG/10ML IV SOLN
INTRAVENOUS | Status: DC | PRN
  Administered 2024-03-30: 50 mg via INTRAVENOUS

## 2024-03-30 MED ORDER — PANTOPRAZOLE SODIUM 40 MG PO TBEC
40.0000 mg | DELAYED_RELEASE_TABLET | Freq: Every day | ORAL | Status: DC
  Administered 2024-03-31 – 2024-04-04 (×5): 40 mg via ORAL
  Filled 2024-03-30 (×5): qty 1

## 2024-03-30 MED ORDER — OXYCODONE HCL 5 MG/5ML PO SOLN: 5.0000 mg | Freq: Once | ORAL | Status: DC | PRN

## 2024-03-30 MED ORDER — DIPHENHYDRAMINE HCL 12.5 MG/5ML PO ELIX: 12.5000 mg | ORAL_SOLUTION | ORAL | Status: DC | PRN

## 2024-03-30 MED ORDER — HYDROMORPHONE HCL 1 MG/ML IJ SOLN
0.5000 mg | INTRAMUSCULAR | Status: DC | PRN
  Administered 2024-03-30: 0.5 mg via INTRAVENOUS
  Filled 2024-03-30: qty 1

## 2024-03-30 MED ORDER — CALCIUM CARBONATE ANTACID 500 MG PO CHEW
2.0000 | CHEWABLE_TABLET | Freq: Every day | ORAL | Status: DC | PRN
  Administered 2024-03-31: 400 mg via ORAL
  Filled 2024-03-30 (×2): qty 2

## 2024-03-30 MED ORDER — PHENYLEPHRINE 80 MCG/ML (10ML) SYRINGE FOR IV PUSH (FOR BLOOD PRESSURE SUPPORT)
PREFILLED_SYRINGE | INTRAVENOUS | Status: AC
  Filled 2024-03-30: qty 10

## 2024-03-30 MED ORDER — SODIUM CHLORIDE 0.9 % IV SOLN: INTRAVENOUS | Status: DC

## 2024-03-30 MED ORDER — CEFAZOLIN SODIUM-DEXTROSE 2-4 GM/100ML-% IV SOLN
2.0000 g | INTRAVENOUS | Status: AC
  Administered 2024-03-30: 2 g via INTRAVENOUS
  Filled 2024-03-30: qty 100

## 2024-03-30 MED ORDER — FENTANYL CITRATE (PF) 100 MCG/2ML IJ SOLN
INTRAMUSCULAR | Status: DC | PRN
  Administered 2024-03-30 (×2): 50 ug via INTRAVENOUS

## 2024-03-30 MED ORDER — VITAMIN D3 125 MCG (5000 UT) PO CAPS: 5000.0000 [IU] | ORAL_CAPSULE | Freq: Every day | ORAL | Status: DC

## 2024-03-30 MED ORDER — HYDROCODONE-ACETAMINOPHEN 7.5-325 MG PO TABS
1.0000 | ORAL_TABLET | ORAL | Status: DC | PRN
  Administered 2024-03-31 – 2024-04-01 (×4): 1 via ORAL
  Administered 2024-04-02 (×2): 2 via ORAL
  Administered 2024-04-02: 1 via ORAL
  Administered 2024-04-02 – 2024-04-04 (×9): 2 via ORAL
  Filled 2024-03-30: qty 1
  Filled 2024-03-30 (×7): qty 2
  Filled 2024-03-30: qty 1
  Filled 2024-03-30: qty 2
  Filled 2024-03-30: qty 1
  Filled 2024-03-30: qty 2
  Filled 2024-03-30 (×2): qty 1
  Filled 2024-03-30 (×2): qty 2

## 2024-03-30 MED ORDER — SODIUM CHLORIDE 0.9 % IR SOLN
Status: DC | PRN
  Administered 2024-03-30: 1000 mL

## 2024-03-30 MED ORDER — ROSUVASTATIN CALCIUM 10 MG PO TABS
10.0000 mg | ORAL_TABLET | Freq: Every morning | ORAL | Status: DC
  Administered 2024-03-31 – 2024-04-04 (×5): 10 mg via ORAL
  Filled 2024-03-30 (×5): qty 1

## 2024-03-30 MED ORDER — ALBUMIN HUMAN 5 % IV SOLN
INTRAVENOUS | Status: AC
  Filled 2024-03-30: qty 250

## 2024-03-30 MED ORDER — DEXAMETHASONE SOD PHOSPHATE PF 10 MG/ML IJ SOLN
INTRAMUSCULAR | Status: DC | PRN
  Administered 2024-03-30: 10 mg via INTRAVENOUS

## 2024-03-30 MED ORDER — FENTANYL CITRATE (PF) 50 MCG/ML IJ SOSY
50.0000 ug | PREFILLED_SYRINGE | INTRAMUSCULAR | Status: AC | PRN
  Administered 2024-03-30 (×2): 50 ug via INTRAVENOUS
  Filled 2024-03-30 (×2): qty 1

## 2024-03-30 MED ORDER — HYDROMORPHONE HCL 1 MG/ML IJ SOLN
0.5000 mg | Freq: Once | INTRAMUSCULAR | Status: AC
  Administered 2024-03-30: 0.5 mg via INTRAVENOUS
  Filled 2024-03-30: qty 1

## 2024-03-30 MED ORDER — METHOCARBAMOL 1000 MG/10ML IJ SOLN: 500.0000 mg | Freq: Four times a day (QID) | INTRAMUSCULAR | Status: DC | PRN

## 2024-03-30 MED ORDER — PHENYLEPHRINE HCL (PRESSORS) 10 MG/ML IV SOLN
INTRAVENOUS | Status: AC
  Filled 2024-03-30: qty 1

## 2024-03-30 MED ORDER — TRANEXAMIC ACID-NACL 1000-0.7 MG/100ML-% IV SOLN
1000.0000 mg | INTRAVENOUS | Status: DC
  Filled 2024-03-30: qty 100

## 2024-03-30 MED ORDER — ONDANSETRON HCL 4 MG/2ML IJ SOLN
INTRAMUSCULAR | Status: DC | PRN
  Administered 2024-03-30: 4 mg via INTRAVENOUS

## 2024-03-30 MED ORDER — LACTATED RINGERS IV SOLN: INTRAVENOUS | Status: DC | PRN

## 2024-03-30 MED ORDER — ACETAMINOPHEN 10 MG/ML IV SOLN
1000.0000 mg | Freq: Once | INTRAVENOUS | Status: AC
  Administered 2024-03-30: 1000 mg via INTRAVENOUS
  Filled 2024-03-30: qty 100

## 2024-03-30 MED ORDER — PSYLLIUM 95 % PO PACK
1.0000 | PACK | Freq: Every day | ORAL | Status: DC
  Administered 2024-03-31 – 2024-04-04 (×5): 1 via ORAL
  Filled 2024-03-30 (×5): qty 1

## 2024-03-30 MED ORDER — HYDROCODONE-ACETAMINOPHEN 5-325 MG PO TABS
1.0000 | ORAL_TABLET | ORAL | Status: DC | PRN
  Administered 2024-03-31 (×2): 1 via ORAL
  Administered 2024-04-01: 2 via ORAL
  Administered 2024-04-01: 1 via ORAL
  Administered 2024-04-01 – 2024-04-02 (×2): 2 via ORAL
  Filled 2024-03-30: qty 1
  Filled 2024-03-30: qty 2
  Filled 2024-03-30 (×2): qty 1
  Filled 2024-03-30 (×2): qty 2

## 2024-03-30 MED ORDER — METOCLOPRAMIDE HCL 5 MG PO TABS: 5.0000 mg | ORAL_TABLET | Freq: Three times a day (TID) | ORAL | Status: DC | PRN

## 2024-03-30 MED ORDER — DEXAMETHASONE SOD PHOSPHATE PF 10 MG/ML IJ SOLN
INTRAMUSCULAR | Status: AC
  Filled 2024-03-30: qty 1

## 2024-03-30 MED ORDER — ONDANSETRON HCL 4 MG PO TABS: 4.0000 mg | ORAL_TABLET | Freq: Four times a day (QID) | ORAL | Status: DC | PRN

## 2024-03-30 MED ORDER — FENTANYL CITRATE (PF) 100 MCG/2ML IJ SOLN
INTRAMUSCULAR | Status: AC
  Filled 2024-03-30: qty 2

## 2024-03-30 MED ORDER — PROPOFOL 10 MG/ML IV BOLUS
INTRAVENOUS | Status: DC | PRN
  Administered 2024-03-30: 120 mg via INTRAVENOUS

## 2024-03-30 MED ORDER — ACETAMINOPHEN 325 MG PO TABS
325.0000 mg | ORAL_TABLET | Freq: Four times a day (QID) | ORAL | Status: DC | PRN
  Administered 2024-04-03: 650 mg via ORAL
  Filled 2024-03-30: qty 2

## 2024-03-30 MED ORDER — SUGAMMADEX SODIUM 200 MG/2ML IV SOLN
INTRAVENOUS | Status: AC
  Filled 2024-03-30: qty 2

## 2024-03-30 MED ORDER — ONDANSETRON HCL 4 MG/2ML IJ SOLN: 4.0000 mg | Freq: Four times a day (QID) | INTRAMUSCULAR | Status: DC | PRN

## 2024-03-30 MED ORDER — DOCUSATE SODIUM 100 MG PO CAPS
100.0000 mg | ORAL_CAPSULE | Freq: Two times a day (BID) | ORAL | Status: DC
  Administered 2024-03-31 – 2024-04-04 (×9): 100 mg via ORAL
  Filled 2024-03-30 (×9): qty 1

## 2024-03-30 MED ORDER — LIDOCAINE HCL (PF) 2 % IJ SOLN
INTRAMUSCULAR | Status: AC
  Filled 2024-03-30: qty 5

## 2024-03-30 MED ORDER — TRANEXAMIC ACID-NACL 1000-0.7 MG/100ML-% IV SOLN
INTRAVENOUS | Status: DC | PRN
  Administered 2024-03-30: 1000 mg via INTRAVENOUS

## 2024-03-30 MED ORDER — POVIDONE-IODINE 10 % EX SWAB
2.0000 | Freq: Once | CUTANEOUS | Status: AC
  Administered 2024-03-30: 2 via TOPICAL

## 2024-03-30 MED ORDER — SUCCINYLCHOLINE CHLORIDE 200 MG/10ML IV SOSY
PREFILLED_SYRINGE | INTRAVENOUS | Status: DC | PRN
  Administered 2024-03-30: 120 mg via INTRAVENOUS

## 2024-03-30 MED ORDER — SUGAMMADEX SODIUM 200 MG/2ML IV SOLN
INTRAVENOUS | Status: DC | PRN
  Administered 2024-03-30: 200 mg via INTRAVENOUS

## 2024-03-30 MED ORDER — PHENOL 1.4 % MT LIQD: 1.0000 | OROMUCOSAL | Status: DC | PRN

## 2024-03-30 MED ORDER — ROCURONIUM BROMIDE 10 MG/ML (PF) SYRINGE
PREFILLED_SYRINGE | INTRAVENOUS | Status: AC
  Filled 2024-03-30: qty 10

## 2024-03-30 MED ORDER — PROPOFOL 10 MG/ML IV BOLUS
INTRAVENOUS | Status: AC
  Filled 2024-03-30: qty 20

## 2024-03-30 MED ORDER — MORPHINE SULFATE (PF) 2 MG/ML IV SOLN
0.5000 mg | INTRAVENOUS | Status: DC | PRN
  Administered 2024-03-31 – 2024-04-03 (×3): 1 mg via INTRAVENOUS
  Filled 2024-03-30 (×3): qty 1

## 2024-03-30 MED ORDER — APIXABAN 2.5 MG PO TABS
2.5000 mg | ORAL_TABLET | Freq: Two times a day (BID) | ORAL | Status: DC
  Administered 2024-03-31 – 2024-04-01 (×3): 2.5 mg via ORAL
  Filled 2024-03-30 (×3): qty 1

## 2024-03-30 MED ORDER — METHOCARBAMOL 500 MG PO TABS
500.0000 mg | ORAL_TABLET | Freq: Four times a day (QID) | ORAL | Status: DC | PRN
  Administered 2024-03-31 – 2024-04-04 (×10): 500 mg via ORAL
  Filled 2024-03-30 (×10): qty 1

## 2024-03-30 MED ORDER — SODIUM CHLORIDE 0.9 % IV SOLN
100.0000 mg | Freq: Two times a day (BID) | INTRAVENOUS | Status: DC
  Administered 2024-03-31: 100 mg via INTRAVENOUS
  Filled 2024-03-30 (×2): qty 100

## 2024-03-30 MED ORDER — ONDANSETRON HCL 4 MG/2ML IJ SOLN
INTRAMUSCULAR | Status: AC
  Filled 2024-03-30: qty 2

## 2024-03-30 MED ORDER — SENNOSIDES-DOCUSATE SODIUM 8.6-50 MG PO TABS
1.0000 | ORAL_TABLET | Freq: Two times a day (BID) | ORAL | Status: DC
  Administered 2024-03-31 – 2024-04-04 (×9): 1 via ORAL
  Filled 2024-03-30 (×9): qty 1

## 2024-03-30 MED ORDER — PHENYLEPHRINE 80 MCG/ML (10ML) SYRINGE FOR IV PUSH (FOR BLOOD PRESSURE SUPPORT)
PREFILLED_SYRINGE | INTRAVENOUS | Status: DC | PRN
  Administered 2024-03-30 (×2): 80 ug via INTRAVENOUS

## 2024-03-30 MED ORDER — PHENYLEPHRINE HCL-NACL 20-0.9 MG/250ML-% IV SOLN
INTRAVENOUS | Status: DC | PRN
  Administered 2024-03-30: 50 ug/min via INTRAVENOUS

## 2024-03-30 MED ORDER — HYDROMORPHONE HCL 1 MG/ML IJ SOLN
1.0000 mg | Freq: Once | INTRAMUSCULAR | Status: AC
  Administered 2024-03-30: 1 mg via INTRAVENOUS
  Filled 2024-03-30: qty 1

## 2024-03-30 MED ORDER — DROPERIDOL 2.5 MG/ML IJ SOLN: 0.6250 mg | Freq: Once | INTRAMUSCULAR | Status: DC | PRN

## 2024-03-30 MED ORDER — ACETAMINOPHEN 10 MG/ML IV SOLN: 1000.0000 mg | Freq: Once | INTRAVENOUS | Status: DC | PRN

## 2024-03-30 MED ORDER — SODIUM CHLORIDE 0.9 % IV SOLN
2.0000 g | INTRAVENOUS | Status: AC
  Administered 2024-03-30 – 2024-04-04 (×6): 2 g via INTRAVENOUS
  Filled 2024-03-30 (×6): qty 20

## 2024-03-30 MED ORDER — HYDROMORPHONE HCL 1 MG/ML IJ SOLN
0.2500 mg | INTRAMUSCULAR | Status: DC | PRN
  Administered 2024-03-30 (×4): 0.5 mg via INTRAVENOUS

## 2024-03-30 NOTE — Anesthesia Preprocedure Evaluation (Addendum)
 "                                  Anesthesia Evaluation  Patient identified by MRN, date of birth, ID band Patient awake    Reviewed: Allergy & Precautions, NPO status , Patient's Chart, lab work & pertinent test results  Airway Mallampati: II  TM Distance: >3 FB Neck ROM: Full    Dental no notable dental hx. (+) Teeth Intact, Dental Advisory Given   Pulmonary shortness of breath, former smoker Influenza A 2 weeks ago   Pulmonary exam normal breath sounds clear to auscultation       Cardiovascular negative cardio ROS Normal cardiovascular exam+ dysrhythmias (on apixiban) Atrial Fibrillation + pacemaker (sss)  Rhythm:Regular Rate:Normal  11/05/2022 TTE report: IMPRESSIONS:   1. Left ventricular ejection fraction, by estimation, is 60 to 65%. The  left ventricle has normal function. The left ventricle has no regional  wall motion abnormalities. There is mild left ventricular hypertrophy.  Left ventricular diastolic parameters  are indeterminate. Elevated left atrial pressure.   2. Right ventricular systolic function is normal. The right ventricular  size is mildly enlarged.   3. Left atrial size was severely dilated.   4. Right atrial size was severely dilated.   5. The mitral valve is normal in structure. Mild mitral valve  regurgitation. No evidence of mitral stenosis.   6. Tricuspid valve regurgitation is mild to moderate.   7. The aortic valve is tricuspid. Aortic valve regurgitation is not  visualized. Aortic valve sclerosis is present, with no evidence of aortic  valve stenosis.   8. Aortic dilatation noted. 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     Neuro/Psych    GI/Hepatic ,GERD  ,,(+) Hepatitis -, A  Endo/Other    Renal/GU Lab Results      Component                Value               Date                      NA                       135                 03/30/2024                CL                       100                  03/30/2024                K                        4.0                 03/30/2024                CO2                      24                  03/30/2024  BUN                      15                  03/30/2024                CREATININE               0.83                03/30/2024                GFRNONAA                 >60                 03/30/2024                CALCIUM                   9.9                 03/30/2024                PHOS                     3.4                 02/12/2021                ALBUMIN                   3.2 (L)             06/11/2021                GLUCOSE                  127 (H)             03/30/2024              Prostate CA    Musculoskeletal  (+) Arthritis , Osteoarthritis,    Abdominal   Peds  Hematology Apixiban Lab Results      Component                Value               Date                      WBC                      5.3                 03/30/2024                HGB                      10.2 (L)            03/30/2024                HCT                      31.7 (L)            03/30/2024                MCV  94.1                03/30/2024                PLT                      193                 03/30/2024              Anesthesia Other Findings All: atorvastatin EZetimbe  Reproductive/Obstetrics                              Anesthesia Physical Anesthesia Plan  ASA: 3  Anesthesia Plan: General   Post-op Pain Management: Ofirmev  IV (intra-op)* and Precedex   Induction: Intravenous  PONV Risk Score and Plan: 2 and Treatment may vary due to age or medical condition, Ondansetron  and Dexamethasone   Airway Management Planned: Oral ETT  Additional Equipment: None  Intra-op Plan:   Post-operative Plan: Extubation in OR  Informed Consent: I have reviewed the patients History and Physical, chart, labs and discussed the procedure including the risks, benefits and  alternatives for the proposed anesthesia with the patient or authorized representative who has indicated his/her understanding and acceptance.     Dental advisory given  Plan Discussed with: CRNA  Anesthesia Plan Comments:         Anesthesia Quick Evaluation  "

## 2024-03-30 NOTE — Consult Note (Signed)
 "   ORTHOPAEDIC CONSULTATION  REQUESTING PHYSICIAN: Celinda Alm Lot, MD  PCP:  Charlott Dorn LABOR, MD  Chief Complaint: right hip pain  HPI: Todd Chapman is a 79 y.o. male  PMH of osteoporosis, chronic bilateral superior rami stress fractures, and chronic LBP-sees Dr. Mavis, and chronic hip pain which he localizes to the low back / SI joint. Also takes Eliquis  for AFib. Patient presented to [facility] ED following unwitnessed GLF last evening when he was stretching his legs. His wife found him on the ground. He does not recall what happened. He had R hip pain and inability to weight bear. Workup revealed comminuted right peritrochanteric femur fracture. Orthopedics consulted for management. He denies any tingling or numbness in LE bilaterally. No other pain noted.   Hgb 10.2 He lives at home with his wife.  Eliquis  at baseline for A-fib, last does yesterday morning.  He normally ambulates without assistive devices.    Past Medical History:  Diagnosis Date   Arthritis    thumbs, back (02/16/2017)   Bradycardia 02/2017   Dyspnea    with exertion   Flu 2 weeks ago   GERD (gastroesophageal reflux disease)    Hepatitis A as child   HLD (hyperlipidemia)    Persistent atrial fibrillation (HCC) 12/18/2016   Pneumonia 1999; 2018   Presence of permanent cardiac pacemaker    st jude   Prostate cancer [facility])    Past Surgical History:  Procedure Laterality Date   APPENDECTOMY     age 89   CARDIOVERSION  02/16/2017   CARDIOVERSION N/A 02/16/2017   Procedure: CARDIOVERSION;  Surgeon: Kelsie Agent, MD;  Location: [facility] INVASIVE [facility] LAB;  Service: Cardiovascular;  Laterality: N/A;   COLONOSCOPY WITH PROPOFOL  N/A 12/13/2012   Procedure: COLONOSCOPY WITH PROPOFOL ;  Surgeon: Gladis MARLA Louder, MD;  Location: [facility] ENDOSCOPY;  Service: Endoscopy;  Laterality: N/A;   FRACTURE SURGERY     HYDROCELE EXCISION Left 05/16/2018   Procedure: HYDROCELECTOMY ADULT;  Surgeon: Matilda Senior, MD;   Location: [facility];  Service: Urology;  Laterality: Left;   INSERT / REPLACE / REMOVE PACEMAKER  02/16/2017   KYPHOPLASTY N/A 02/10/2021   Procedure: KYPHOPLASTY L-5;  Surgeon: Mavis Purchase, MD;  Location: [facility] OR;  Service: Neurosurgery;  Laterality: N/A;   LAPAROSCOPY N/A 06/04/2020   Procedure: POSSIBLE LAPAROTOMY POSSIBLE BOWEL RESSECTION;  Surgeon: Signe Mitzie LABOR, MD;  Location: [facility] ORS;  Service: General;  Laterality: N/A;   PACEMAKER IMPLANT N/A 02/16/2017   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 by Dr Kelsie   PROSTATE BIOPSY  11/23/2019   WRIST FRACTURE SURGERY Left    with pins   Social History   Socioeconomic History   Marital status: Married    Spouse name: Not on file   Number of children: 2   Years of education: Not on file   Highest education level: Not on file  Occupational History   Not on file  Tobacco Use   Smoking status: Former    Current packs/day: 0.00    Average packs/day: 0.1 packs/day for 8.0 years (1.0 ttl pk-yrs)    Types: Cigarettes    Start date: 11/24/1957    Quit date: 11/24/1965    Years since quitting: 58.3   Smokeless tobacco: Never  Vaping Use   Vaping status: Never Used  Substance and Sexual Activity   Alcohol  use: Yes    Comment: 02/16/2017 might have 1 drink/month   Drug use:  No   Sexual activity: Yes  Other Topics Concern   Not on file  Social History Narrative   He is a judge   Social Drivers of Health   Tobacco Use: Medium Risk (03/30/2024)   Patient History    Smoking Tobacco Use: Former    Smokeless Tobacco Use: Never    Passive Exposure: Not on Actuary Strain: Not on file  Food Insecurity: Not on file  Transportation Needs: Not on file  Physical Activity: Not on file  Stress: Not on file  Social Connections: Not on file  Depression (PHQ2-9): Low Risk (12/23/2022)   Depression (PHQ2-9)    PHQ-2 Score: 0  Alcohol  Screen:  Not on file  Housing: Not on file  Utilities: Not on file  Health Literacy: Not on file   Family History  Problem Relation Age of Onset   CVA Mother    Coronary artery disease Mother    Stroke Mother    Coronary artery disease Father    Heart attack Father    Stomach cancer Maternal Grandmother    Breast cancer Neg Hx    Prostate cancer Neg Hx    Colon cancer Neg Hx    Pancreatic cancer Neg Hx    Allergies[1] Prior to Admission medications  Medication Sig Start Date End Date Taking? Authorizing Provider  acetaminophen  (TYLENOL ) 325 MG tablet Take 2 tablets (650 mg total) by mouth every 6 (six) hours. 02/21/21  Yes Setzer, Sandra J, PA-C  albuterol  (VENTOLIN  HFA) 108 (90 Base) MCG/ACT inhaler Inhale 1-2 puffs into the lungs every 6 (six) hours as needed for wheezing or shortness of breath. 08/23/22  Yes Jerrol Agent, MD  apixaban  (ELIQUIS ) 5 MG TABS tablet TAKE 1 TABLET(5 MG) BY MOUTH TWICE DAILY 12/31/23  Yes Camnitz, Soyla Lunger, MD  calcium  carbonate (TUMS - DOSED IN MG ELEMENTAL CALCIUM ) 500 MG chewable tablet Chew 2 tablets by mouth daily as needed for indigestion or heartburn.   Yes [provider]  lidocaine  (LIDODERM ) 5 % PLACE 2 PATCHED ONTO THE SKIN ONCE DAILY. 12 HOURS ON AND 12 HOURS OFF. 12/07/23  Yes Lovorn, Megan, MD  Multiple Vitamins-Minerals (PRESERVISION AREDS 2) CAPS 1 capsule   Yes [provider]  pantoprazole  (PROTONIX ) 40 MG tablet Take 40 mg by mouth daily. 10/16/16  Yes [provider]  Polyethyl Glycol-Propyl Glycol (SYSTANE) 0.4-0.3 % GEL ophthalmic gel See admin instructions.   Yes [provider]  polyethylene glycol (MIRALAX  / GLYCOLAX ) 17 g packet Take 17 g by mouth daily. 02/21/21  Yes Setzer, Sandra J, PA-C  Propylene Glycol (SYSTANE BALANCE OP) Place 1 drop into both eyes daily as needed (dry eyes).   Yes [provider]  psyllium (METAMUCIL) 58.6 % packet Take 1 packet by mouth daily.   Yes [provider]  rosuvastatin  (CRESTOR ) 10 MG tablet Take 10 mg by mouth every morning.   Yes [provider]  trolamine salicylate (ASPERCREME) 10 % cream Apply 1 application  topically daily.   Yes [provider]  alendronate (FOSAMAX) 70 MG tablet Take 70 mg by mouth once a week. Patient not taking: Reported on 03/30/2024    [provider]  Cholecalciferol (VITAMIN D3) 125 MCG (5000 UT) CAPS 2 capsules Patient not taking: Reported on 03/30/2024    [provider]  Lidocaine  5 % CREA     [provider]  Multiple Vitamin (MULTIVITAMIN WITH MINERALS) TABS tablet Take 1 tablet by mouth daily. Patient not  taking: Reported on 03/30/2024    [provider]  olmesartan (BENICAR) 20 MG tablet Take 20 mg by mouth daily. 10/27/21   [provider]  sodium chloride  (OCEAN) 0.65 % SOLN nasal spray Place 1 spray into both nostrils at bedtime. Patient not taking: Reported on 03/30/2024    [provider]   DG Knee Right Port Result Date: 03/30/2024 CLINICAL DATA:  Right proximal femur fracture. EXAM: PORTABLE RIGHT KNEE - 1-2 VIEW COMPARISON:  Hip films same day. FINDINGS: Suboptimal positioning of the AP film. The visualized distal femur is otherwise unremarkable. Mild degenerative change of the patellofemoral joint. No significant knee joint effusion. IMPRESSION: 1. No acute findings. 2. Mild degenerative change of the patellofemoral joint. Electronically Signed   By: Toribio Agreste M.D.   On: 03/30/2024 08:39   DG Chest Port 1 View Result Date: 03/30/2024 EXAM: 1 VIEW(S) XRAY OF THE CHEST 03/30/2024 03:06:00 AM COMPARISON: 04/07/2023. CLINICAL HISTORY: hip pain, fracture, preop FINDINGS: LINES, TUBES AND DEVICES: Left chest dual-chamber pacemaker with leads projecting over right atrium and ventricle. LUNGS AND PLEURA: Low lung volumes. Mild diffuse interstitial opacities, lower lung predominant, raising concern for multifocal  infection/pneumonia. No pleural effusion. No pneumothorax. HEART AND MEDIASTINUM: Cardiomegaly. Aortic atherosclerotic calcification. BONES AND SOFT TISSUES: No acute osseous abnormality. IMPRESSION: 1. Mild diffuse interstitial opacities, lower lung predominant, raising concern for multifocal infection/pneumonia. 2. Aortic Atherosclerosis (ICD10-I70.0). Electronically signed by: Pinkie Pebbles MD 03/30/2024 03:10 AM EST RP Workstation: [id number]   DG Hip Unilat W or Wo Pelvis 2-3 Views Right Result Date: 03/30/2024 EXAM: 2 or 3 VIEW(S) XRAY OF THE RIGHT HIP 03/30/2024 02:16:16 AM COMPARISON: None available. CLINICAL HISTORY: pain FINDINGS: BONES AND JOINTS: Comminuted intertrochanteric right hip fracture with displaced lesser trochanter fragment, foreshortening with varus angulation. SOFT TISSUES: Brachytherapy seeds overlying the prostate. LUMBAR SPINE: Prior vertebral augmentation at L5. IMPRESSION: 1. Comminuted intertrochanteric right hip fracture. Electronically signed by: Pinkie Pebbles MD 03/30/2024 02:32 AM EST RP Workstation: [id number]    Positive ROS: All other systems have been reviewed and were otherwise negative with the exception of those mentioned in the HPI and as above.  Physical Exam: General: Alert, no acute distress Cardiovascular: Mild edema in LE bilaterally.  Respiratory: No cyanosis, no use of accessory musculature GI: No organomegaly, abdomen is soft and non-tender Skin: No lesions in the area of chief complaint Psychiatric: Patient is competent for consent with normal mood and affect Lymphatic: No axillary or cervical lymphadenopathy  MUSCULOSKELETAL:  Examination of the right hip reveals no skin wounds or lesions. Hip flexed and in ER. Pain with any motion. TTP over trochanteric region.  Sensation intact to light touch in LE bilaterally. Motor function intact in LE bilaterally including plantar flexion, dorsiflexion and EHL. Distal pedal pulses 2+ bilaterally.  Compartments soft. Mild edema in LE bilaterally.   Assessment: Comminuted right peritrochanteric femur fracture  A-fib, chronic Eliquis . Chronic bilateral superior rami stress fractures. Chronic low back pain.  Osteoporosis, alendronate.   Plan: I discussed the findings with the patient and his wife. He has a comminuted right peritrochanteric femur fracture Hgb 10.2 in the ED. Chronic Eliquis  for A-fib, last dose yesterday morning. Right hip fracture is unstable and requires surgical management. Plan for OR today depending on scheduling availability for IM nail R femur. NPO, hold Eliquis . All questions solicited and answered.     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

## 2024-03-30 NOTE — ED Provider Notes (Signed)
 " [place] EMERGENCY DEPARTMENT AT [facility] Provider Note   CSN: [id number] Arrival date & time: 03/30/24  9964     Patient presents with: Leg Pain   Todd Chapman is a 79 y.o. male.   The history is provided by the patient and the spouse.  Patient presents with significant pain in his right hip. Patient has pain issues in his back and right hip previously.  Her reports he has required injections previously for this pain. Tonight he was stretching his legs out when his wife found him in the floor called reporting significant pain in the right leg.  Denies any falls or trauma. Patient is unable to provide much history due to significant pain    Past Medical History:  Diagnosis Date   Arthritis    thumbs, back (02/16/2017)   Bradycardia 02/2017   Dyspnea    with exertion   Flu 2 weeks ago   GERD (gastroesophageal reflux disease)    Hepatitis A as child   HLD (hyperlipidemia)    Persistent atrial fibrillation (HCC) 12/18/2016   Pneumonia 1999; 2018   Presence of permanent cardiac pacemaker    st jude   Prostate cancer [facility])     Prior to Admission medications  Medication Sig Start Date End Date Taking? Authorizing Provider  acetaminophen  (TYLENOL ) 325 MG tablet Take 2 tablets (650 mg total) by mouth every 6 (six) hours. 02/21/21   Setzer, Sandra J, PA-C  albuterol  (VENTOLIN  HFA) 108 (90 Base) MCG/ACT inhaler Inhale 1-2 puffs into the lungs every 6 (six) hours as needed for wheezing or shortness of breath. 08/23/22   Lawsing, James, MD  alendronate (FOSAMAX) 70 MG tablet Take 70 mg by mouth once a week.    [provider]  apixaban  (ELIQUIS ) 5 MG TABS tablet TAKE 1 TABLET(5 MG) BY MOUTH TWICE DAILY 12/31/23   Camnitz, Soyla Lunger, MD  calcium  carbonate (TUMS - DOSED IN MG ELEMENTAL CALCIUM ) 500 MG chewable tablet Chew 2 tablets by mouth daily as needed for indigestion or heartburn.    [provider]  Cholecalciferol (VITAMIN D3) 125 MCG  (5000 UT) CAPS 2 capsules    [provider]  lidocaine  (LIDODERM ) 5 % PLACE 2 PATCHED ONTO THE SKIN ONCE DAILY. 12 HOURS ON AND 12 HOURS OFF. 12/07/23   Lovorn, Megan, MD  Lidocaine  5 % CREA     [provider]  Multiple Vitamin (MULTIVITAMIN WITH MINERALS) TABS tablet Take 1 tablet by mouth daily.    [provider]  Multiple Vitamins-Minerals (PRESERVISION AREDS 2) CAPS 1 capsule    [provider]  olmesartan (BENICAR) 20 MG tablet Take 20 mg by mouth daily. 10/27/21   [provider]  pantoprazole  (PROTONIX ) 40 MG tablet Take 40 mg by mouth daily. 10/16/16   [provider]  Polyethyl Glycol-Propyl Glycol (SYSTANE) 0.4-0.3 % GEL ophthalmic gel See admin instructions.    [provider]  polyethylene glycol (MIRALAX  / GLYCOLAX ) 17 g packet Take 17 g by mouth daily. 02/21/21   Setzer, Sandra J, PA-C  Propylene Glycol (SYSTANE BALANCE OP) Place 1 drop into both eyes daily as needed (dry eyes).    [provider]  psyllium (METAMUCIL) 58.6 % packet Take 1 packet by mouth daily.    [provider]  rosuvastatin  (CRESTOR ) 10 MG tablet Take 10 mg by mouth every morning.    [provider]  sodium chloride  (OCEAN) 0.65 % SOLN nasal spray Place 1 spray into both  nostrils at bedtime.    [provider]  trolamine salicylate (ASPERCREME) 10 % cream Apply 1 application  topically daily.    [provider]    Allergies: Atorvastatin, Ezetimibe, and Morphine  and codeine    Review of Systems  Gastrointestinal:  Negative for abdominal pain.  Musculoskeletal:  Positive for arthralgias.    Updated Vital Signs BP (!) 140/79 (BP Location: Left Arm)   Pulse 80   Temp 98.2 F (36.8 C) (Oral)   Resp 14   Ht 1.803 m (5' 11)   Wt 98.4 kg   SpO2 100%   BMI 30.26 kg/m   Physical Exam CONSTITUTIONAL: Elderly, writhing in pain HEAD: Normocephalic/atraumatic NEURO: Pt is awake/alert/appropriate,  moves all extremitiesx4.  No facial droop.   ABD - no abdominal tenderness, no bruising EXTREMITIES: Distal pulses equal and intact in both feet The right lower extremity is shortened and externally rotated Significant tenderness noted palpation of the right hip and with any movement of the hip All other extremities/joints palpated/ranged and nontender SKIN: warm, color normal PSYCH: Anxious and yelling in pain (all labs ordered are listed, but only abnormal results are displayed) Labs Reviewed  BASIC METABOLIC PANEL WITH GFR - Abnormal; Notable for the following components:      Result Value   Glucose, Bld 127 (*)    All other components within normal limits  CBC WITH DIFFERENTIAL/PLATELET - Abnormal; Notable for the following components:   RBC 3.37 (*)    Hemoglobin 10.2 (*)    HCT 31.7 (*)    All other components within normal limits  ETHANOL - Abnormal; Notable for the following components:   Alcohol , Ethyl (B) 103 (*)    All other components within normal limits  PROTIME-INR  TYPE AND SCREEN    EKG: EKG Interpretation Date/Time:  Thursday March 30 2024 02:55:26 EST Ventricular Rate:  81 PR Interval:    QRS Duration:  101 QT Interval:  316 QTC Calculation: 367 R Axis:   62  Text Interpretation: Atrial fibrillation RSR' in V1 or V2, right VCD or RVH Nonspecific repol abnormality, lateral leads Confirmed by Midge Golas ([id number]) on 03/30/2024 3:13:41 AM  Radiology: [facility] Chest Port 1 View Result Date: 03/30/2024 EXAM: 1 VIEW(S) XRAY OF THE CHEST 03/30/2024 03:06:00 AM COMPARISON: 04/07/2023. CLINICAL HISTORY: hip pain, fracture, preop FINDINGS: LINES, TUBES AND DEVICES: Left chest dual-chamber pacemaker with leads projecting over right atrium and ventricle. LUNGS AND PLEURA: Low lung volumes. Mild diffuse interstitial opacities, lower lung predominant, raising concern for multifocal infection/pneumonia. No pleural effusion. No pneumothorax. HEART AND MEDIASTINUM:  Cardiomegaly. Aortic atherosclerotic calcification. BONES AND SOFT TISSUES: No acute osseous abnormality. IMPRESSION: 1. Mild diffuse interstitial opacities, lower lung predominant, raising concern for multifocal infection/pneumonia. 2. Aortic Atherosclerosis (ICD10-I70.0). Electronically signed by: Pinkie Pebbles MD 03/30/2024 03:10 AM EST RP Workstation: [id number]   DG Hip Unilat W or Wo Pelvis 2-3 Views Right Result Date: 03/30/2024 EXAM: 2 or 3 VIEW(S) XRAY OF THE RIGHT HIP 03/30/2024 02:16:16 AM COMPARISON: None available. CLINICAL HISTORY: pain FINDINGS: BONES AND JOINTS: Comminuted intertrochanteric right hip fracture with displaced lesser trochanter fragment, foreshortening with varus angulation. SOFT TISSUES: Brachytherapy seeds overlying the prostate. LUMBAR SPINE: Prior vertebral augmentation at L5. IMPRESSION: 1. Comminuted intertrochanteric right hip fracture. Electronically signed by: Pinkie Pebbles MD 03/30/2024 02:32 AM EST RP Workstation: [id number]     Procedures   Medications Ordered in the ED  HYDROmorphone  (DILAUDID ) injection 0.5 mg (0.5 mg Intravenous Given 03/30/24 0137)  HYDROmorphone  (DILAUDID ) injection  1 mg (1 mg Intravenous Given 03/30/24 0154)  fentaNYL  (SUBLIMAZE ) injection 50 mcg (50 mcg Intravenous Given 03/30/24 0334)    Clinical Course as of 03/30/24 0358  Thu Mar 30, 2024  0242 Imaging reveals right hip fracture.  Patient denies any injury though he appears to be a poor historian. On secondary survey, he has no other signs of injury.  No signs of any head or neck trauma.  He denies any back pain.  No signs of any chest or abdominal trauma. The rest of his extremities are atraumatic.  Patient will need to be admitted to medical service and orthopedic consulting.  Patient has multiple medical conditions including pacemaker in place as well as use of anticoagulation [DW]  0326 Hemoglobin(!): 10.2 Mild anemia [DW]  0348 Patient reports last Eliquis  dose was  yesterday morning Denies any recent cough or fever. Admission is pending [DW]  [phone] Discussed the case with Dr. Shona for admission Patient stable, no other acute traumatic injuries besides right hip fracture Will consult orthopedics later this morning [DW]    Clinical Course User Index [DW] Midge Golas, MD                                 Medical Decision Making Amount and/or Complexity of Data Reviewed Labs: ordered. Decision-making details documented in ED Course. Radiology: ordered.  Risk Prescription drug management. Decision regarding hospitalization.   This patient presents to the ED for concern of hip pain, this involves an extensive number of treatment options, and is a complaint that carries with it a high risk of complications and morbidity.  The differential diagnosis includes but is not limited to fracture, muscle strain, dislocation, radiculopathy  Comorbidities that complicate the patient evaluation: Patients presentation is complicated by their history of atrial fibrillation  Additional history obtained: Additional history obtained from spouse Lab Tests: I Ordered, and personally interpreted labs.  The pertinent results include: Elevated alcohol , mild anemia  Imaging Studies ordered: I ordered imaging studies including X-ray right hip  I independently visualized and interpreted imaging which showed right intertrochanteric fracture I agree with the radiologist interpretation    Medicines ordered and prescription drug management: I ordered medication including Dilaudid  for pain Reevaluation of the patient after these medicines showed that the patient    improved  Critical Interventions:   admission for hip fracture  Consultations Obtained: I requested consultation with the admitting physician [facility], and discussed  findings as well as pertinent plan - they recommend: Admit  Reevaluation: After the interventions noted above, I reevaluated the patient  and found that they have :improved  Complexity of problems addressed: Patients presentation is most consistent with  acute presentation with potential threat to life or bodily function  Disposition: After consideration of the diagnostic results and the patients response to treatment,  I feel that the patent would benefit from admission  .        Final diagnoses:  Closed fracture of right hip, initial encounter [facility])    ED Discharge Orders     None          Midge Golas, MD 03/30/24 [phone]  "

## 2024-03-30 NOTE — H&P (View-Only) (Signed)
 "   ORTHOPAEDIC CONSULTATION  REQUESTING PHYSICIAN: Celinda Alm Lot, MD  PCP:  Charlott Dorn LABOR, MD  Chief Complaint: right hip pain  HPI: Todd Chapman is a 79 y.o. male  PMH of osteoporosis, chronic bilateral superior rami stress fractures, and chronic LBP-sees Dr. Mavis, and chronic hip pain which he localizes to the low back / SI joint. Also takes Eliquis  for AFib. Patient presented to [facility] ED following unwitnessed GLF last evening when he was stretching his legs. His wife found him on the ground. He does not recall what happened. He had R hip pain and inability to weight bear. Workup revealed comminuted right peritrochanteric femur fracture. Orthopedics consulted for management. He denies any tingling or numbness in LE bilaterally. No other pain noted.   Hgb 10.2 He lives at home with his wife.  Eliquis  at baseline for A-fib, last does yesterday morning.  He normally ambulates without assistive devices.    Past Medical History:  Diagnosis Date   Arthritis    thumbs, back (02/16/2017)   Bradycardia 02/2017   Dyspnea    with exertion   Flu 2 weeks ago   GERD (gastroesophageal reflux disease)    Hepatitis A as child   HLD (hyperlipidemia)    Persistent atrial fibrillation (HCC) 12/18/2016   Pneumonia 1999; 2018   Presence of permanent cardiac pacemaker    st jude   Prostate cancer [facility])    Past Surgical History:  Procedure Laterality Date   APPENDECTOMY     age 46   CARDIOVERSION  02/16/2017   CARDIOVERSION N/A 02/16/2017   Procedure: CARDIOVERSION;  Surgeon: Kelsie Agent, MD;  Location: [facility] INV[facility]IVE [facility];  Service: Cardiovascular;  Laterality: N/A;   COLONOSCOPY WITH PROPOFOL  N/A 12/13/2012   Procedure: COLONOSCOPY WITH PROPOFOL ;  Surgeon: Gladis MARLA Louder, MD;  Location: [facility] ENDOSCOPY;  Service: Endoscopy;  Laterality: N/A;   FRACTURE SURGERY     HYDROCELE EXCISION Left 05/16/2018   Procedure: HYDROCELECTOMY ADULT;  Surgeon: Matilda Senior, MD;   Location: [facility];  Service: Urology;  Laterality: Left;   INSERT / REPLACE / REMOVE PACEMAKER  02/16/2017   KYPHOPLASTY N/A 02/10/2021   Procedure: KYPHOPLASTY L-5;  Surgeon: Mavis Purchase, MD;  Location: [facility] OR;  Service: Neurosurgery;  Laterality: N/A;   LAPAROSCOPY N/A 06/04/2020   Procedure: POSSIBLE LAPAROTOMY POSSIBLE BOWEL RESSECTION;  Surgeon: Signe Mitzie LABOR, MD;  Location: [facility] ORS;  Service: General;  Laterality: N/A;   PACEMAKER IMPLANT N/A 02/16/2017   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 by Dr Kelsie   PROSTATE BIOPSY  11/23/2019   WRIST FRACTURE SURGERY Left    with pins   Social History   Socioeconomic History   Marital status: Married    Spouse name: Not on file   Number of children: 2   Years of education: Not on file   Highest education level: Not on file  Occupational History   Not on file  Tobacco Use   Smoking status: Former    Current packs/day: 0.00    Average packs/day: 0.1 packs/day for 8.0 years (1.0 ttl pk-yrs)    Types: Cigarettes    Start date: 11/24/1957    Quit date: 11/24/1965    Years since quitting: 58.3   Smokeless tobacco: Never  Vaping Use   Vaping status: Never Used  Substance and Sexual Activity   Alcohol  use: Yes    Comment: 02/16/2017 might have 1 drink/month   Drug use:  No   Sexual activity: Yes  Other Topics Concern   Not on file  Social History Narrative   He is a judge   Social Drivers of Health   Tobacco Use: Medium Risk (03/30/2024)   Patient History    Smoking Tobacco Use: Former    Smokeless Tobacco Use: Never    Passive Exposure: Not on Actuary Strain: Not on file  Food Insecurity: Not on file  Transportation Needs: Not on file  Physical Activity: Not on file  Stress: Not on file  Social Connections: Not on file  Depression (PHQ2-9): Low Risk (12/23/2022)   Depression (PHQ2-9)    PHQ-2 Score: 0  Alcohol  Screen:  Not on file  Housing: Not on file  Utilities: Not on file  Health Literacy: Not on file   Family History  Problem Relation Age of Onset   CVA Mother    Coronary artery disease Mother    Stroke Mother    Coronary artery disease Father    Heart attack Father    Stomach cancer Maternal Grandmother    Breast cancer Neg Hx    Prostate cancer Neg Hx    Colon cancer Neg Hx    Pancreatic cancer Neg Hx    Allergies[1] Prior to Admission medications  Medication Sig Start Date End Date Taking? Authorizing Provider  acetaminophen  (TYLENOL ) 325 MG tablet Take 2 tablets (650 mg total) by mouth every 6 (six) hours. 02/21/21  Yes Setzer, Sandra J, PA-C  albuterol  (VENTOLIN  HFA) 108 (90 Base) MCG/ACT inhaler Inhale 1-2 puffs into the lungs every 6 (six) hours as needed for wheezing or shortness of breath. 08/23/22  Yes Jerrol Agent, MD  apixaban  (ELIQUIS ) 5 MG TABS tablet TAKE 1 TABLET(5 MG) BY MOUTH TWICE DAILY 12/31/23  Yes Camnitz, Soyla Lunger, MD  calcium  carbonate (TUMS - DOSED IN MG ELEMENTAL CALCIUM ) 500 MG chewable tablet Chew 2 tablets by mouth daily as needed for indigestion or heartburn.   Yes [provider]  lidocaine  (LIDODERM ) 5 % PLACE 2 PATCHED ONTO THE SKIN ONCE DAILY. 12 HOURS ON AND 12 HOURS OFF. 12/07/23  Yes Lovorn, Megan, MD  Multiple Vitamins-Minerals (PRESERVISION AREDS 2) CAPS 1 capsule   Yes [provider]  pantoprazole  (PROTONIX ) 40 MG tablet Take 40 mg by mouth daily. 10/16/16  Yes [provider]  Polyethyl Glycol-Propyl Glycol (SYSTANE) 0.4-0.3 % GEL ophthalmic gel See admin instructions.   Yes [provider]  polyethylene glycol (MIRALAX  / GLYCOLAX ) 17 g packet Take 17 g by mouth daily. 02/21/21  Yes Setzer, Sandra J, PA-C  Propylene Glycol (SYSTANE BALANCE OP) Place 1 drop into both eyes daily as needed (dry eyes).   Yes [provider]  psyllium (METAMUCIL) 58.6 % packet Take 1 packet by mouth daily.   Yes [provider]  rosuvastatin  (CRESTOR ) 10 MG tablet Take 10 mg by mouth every morning.   Yes [provider]  trolamine salicylate (ASPERCREME) 10 % cream Apply 1 application  topically daily.   Yes [provider]  alendronate (FOSAMAX) 70 MG tablet Take 70 mg by mouth once a week. Patient not taking: Reported on 03/30/2024    [provider]  Cholecalciferol (VITAMIN D3) 125 MCG (5000 UT) CAPS 2 capsules Patient not taking: Reported on 03/30/2024    [provider]  Lidocaine  5 % CREA     [provider]  Multiple Vitamin (MULTIVITAMIN WITH MINERALS) TABS tablet Take 1 tablet by mouth daily. Patient not  taking: Reported on 03/30/2024    [provider]  olmesartan (BENICAR) 20 MG tablet Take 20 mg by mouth daily. 10/27/21   [provider]  sodium chloride  (OCEAN) 0.65 % SOLN nasal spray Place 1 spray into both nostrils at bedtime. Patient not taking: Reported on 03/30/2024    [provider]   DG Knee Right Port Result Date: 03/30/2024 CLINICAL DATA:  Right proximal femur fracture. EXAM: PORTABLE RIGHT KNEE - 1-2 VIEW COMPARISON:  Hip films same day. FINDINGS: Suboptimal positioning of the AP film. The visualized distal femur is otherwise unremarkable. Mild degenerative change of the patellofemoral joint. No significant knee joint effusion. IMPRESSION: 1. No acute findings. 2. Mild degenerative change of the patellofemoral joint. Electronically Signed   By: Toribio Agreste M.D.   On: 03/30/2024 08:39   DG Chest Port 1 View Result Date: 03/30/2024 EXAM: 1 VIEW(S) XRAY OF THE CHEST 03/30/2024 03:06:00 AM COMPARISON: 04/07/2023. CLINICAL HISTORY: hip pain, fracture, preop FINDINGS: LINES, TUBES AND DEVICES: Left chest dual-chamber pacemaker with leads projecting over right atrium and ventricle. LUNGS AND PLEURA: Low lung volumes. Mild diffuse interstitial opacities, lower lung predominant, raising concern for multifocal  infection/pneumonia. No pleural effusion. No pneumothorax. HEART AND MEDIASTINUM: Cardiomegaly. Aortic atherosclerotic calcification. BONES AND SOFT TISSUES: No acute osseous abnormality. IMPRESSION: 1. Mild diffuse interstitial opacities, lower lung predominant, raising concern for multifocal infection/pneumonia. 2. Aortic Atherosclerosis (ICD10-I70.0). Electronically signed by: Pinkie Pebbles MD 03/30/2024 03:10 AM EST RP Workstation: [id number]   DG Hip Unilat W or Wo Pelvis 2-3 Views Right Result Date: 03/30/2024 EXAM: 2 or 3 VIEW(S) XRAY OF THE RIGHT HIP 03/30/2024 02:16:16 AM COMPARISON: None available. CLINICAL HISTORY: pain FINDINGS: BONES AND JOINTS: Comminuted intertrochanteric right hip fracture with displaced lesser trochanter fragment, foreshortening with varus angulation. SOFT TISSUES: Brachytherapy seeds overlying the prostate. LUMBAR SPINE: Prior vertebral augmentation at L5. IMPRESSION: 1. Comminuted intertrochanteric right hip fracture. Electronically signed by: Pinkie Pebbles MD 03/30/2024 02:32 AM EST RP Workstation: [id number]    Positive ROS: All other systems have been reviewed and were otherwise negative with the exception of those mentioned in the HPI and as above.  Physical Exam: General: Alert, no acute distress Cardiovascular: Mild edema in LE bilaterally.  Respiratory: No cyanosis, no use of accessory musculature GI: No organomegaly, abdomen is soft and non-tender Skin: No lesions in the area of chief complaint Psychiatric: Patient is competent for consent with normal mood and affect Lymphatic: No axillary or cervical lymphadenopathy  MUSCULOSKELETAL:  Examination of the right hip reveals no skin wounds or lesions. Hip flexed and in ER. Pain with any motion. TTP over trochanteric region.  Sensation intact to light touch in LE bilaterally. Motor function intact in LE bilaterally including plantar flexion, dorsiflexion and EHL. Distal pedal pulses 2+ bilaterally.  Compartments soft. Mild edema in LE bilaterally.   Assessment: Comminuted right peritrochanteric femur fracture  A-fib, chronic Eliquis . Chronic bilateral superior rami stress fractures. Chronic low back pain.  Osteoporosis, alendronate.   Plan: I discussed the findings with the patient and his wife. He has a comminuted right peritrochanteric femur fracture Hgb 10.2 in the ED. Chronic Eliquis  for A-fib, last dose yesterday morning. Right hip fracture is unstable and requires surgical management. Plan for OR today depending on scheduling availability for IM nail R femur. NPO, hold Eliquis . All questions solicited and answered.     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

## 2024-03-30 NOTE — Anesthesia Procedure Notes (Signed)
 Date/Time: 03/30/2024 4:55 PM  Performed by: Obadiah Reyes BROCKS, CRNAPre-anesthesia Checklist: Patient identified, Emergency Drugs available, Suction available, Patient being monitored and Timeout performed Patient Re-evaluated:Patient Re-evaluated prior to induction Oxygen Delivery Method: Circle System Utilized Preoxygenation: Pre-oxygenation with 100% oxygen Induction Type: IV induction and Rapid sequence Ventilation: Mask ventilation without difficulty Laryngoscope Size: Miller and 2 Grade View: Grade I Tube type: Oral Number of attempts: 1 Airway Equipment and Method: Stylet and Oral airway Placement Confirmation: ETT inserted through vocal cords under direct vision, positive ETCO2 and breath sounds checked- equal and bilateral Secured at: 22 cm Tube secured with: Tape Dental Injury: Teeth and Oropharynx as per pre-operative assessment

## 2024-03-30 NOTE — ED Provider Notes (Signed)
 Discussed with Dr. Melita with [facility], they will see the patient   Midge Golas, MD 03/30/24 [phone]

## 2024-03-30 NOTE — Interval H&P Note (Signed)
 History and Physical Interval Note:  03/30/2024 2:56 PM  Todd Chapman  has presented today for surgery, with the diagnosis of RIGHT INTERTROCH FEMUR FRACTURE.  The various methods of treatment have been discussed with the patient and family. After consideration of risks, benefits and other options for treatment, the patient has consented to  Procedures: INSERTION, INTRAMEDULLARY ROD, FEMUR (Right) as a surgical intervention.  The patient's history has been reviewed, patient examined, no change in status, stable for surgery.  I have reviewed the patient's chart and labs.  Questions were answered to the patient's satisfaction.    The risks, benefits, and alternatives were discussed with the patient. There are risks associated with the surgery including, but not limited to, problems with anesthesia (death), infection, differences in leg length/angulation/rotation, fracture of bones, loosening or failure of implants, malunion, nonunion, hematoma (blood accumulation) which may require surgical drainage, blood clots, pulmonary embolism, nerve injury (foot drop), and blood vessel injury. The patient understands these risks and elects to proceed.   Todd Chapman Todd Chapman

## 2024-03-30 NOTE — ED Triage Notes (Signed)
 Pt BIB [facility] from home for sudden severe pain and spasms in R leg that woke him up out of his sleep. Pt received a shot at [facility] today for chronic spasms.   150/90 HR 88 RR 18 SP02 98 CBG 119

## 2024-03-30 NOTE — H&P (Signed)
 " History and Physical    Patient: Todd Chapman FMW:[id number] DOB: [birth year] DOA: 03/30/2024 DOS: the patient was seen and examined on 03/30/2024 PCP: Charlott Dorn LABOR, MD  Patient coming from: Home  Chief Complaint:  Chief Complaint  Patient presents with   Leg Pain   HPI: Todd Chapman is a 79 y.o. male with medical history significant of osteoarthritis, bradycardia, sick sinus syndrome, pacemaker placement, influenza A, hepatitis A, constipation, GERD, diverticulosis, hyperlipidemia, history of multifocal pneumonia, small bowel obstruction, prostate cancer, polypharmacy, rosacea who was brought via EMS from home after waking up with sudden, severe RLE pain associated with spasms after being found on the floor by his wife.  The patient does not have any recollection of what happened.  Apparently he was stretching, which he normally does and was later found on the floor by his wife. He denied fever, chills, rhinorrhea, sore throat, wheezing or hemoptysis.  No chest pain, palpitations, diaphoresis, PND, orthopnea or recent pitting edema of the lower extremities.  No abdominal pain, nausea, emesis, diarrhea, melena or hematochezia, but gets frequently constipated.  No flank pain, dysuria, frequency or hematuria.  No polyuria, polydipsia, polyphagia or blurred vision.   Lab work: CBC showed a white count of 5.3, hemoglobin 10.2 g/dL and platelets 806.  Unremarkable PT and INR.  Alcohol  was 130 and glucose 127 mg/dL.  Electrolytes and renal function were normal.  Imaging: Right hip x-ray showing comminuted intertrochanteric right hip fracture.  Portable 1 view chest radiograph showing mild diffuse interstitial opacities, lower lobe predominant, raising concern for multifocal infection/pneumonia.  Aortic atherosclerosis.  ED course: Initial vital signs were temperature 98.2 F, pulse 80, respiration 14, BP 140/79 mmHg and O2 sat 100% on room air.  The patient received hydromorphone  0.5 mg  IVP x 2, hydromorphone  1 mg IVP x 1 and fentanyl  50 mcg IVP x 1.   Review of Systems: As mentioned in the history of present illness. All other systems reviewed and are negative. Past Medical History:  Diagnosis Date   Arthritis    thumbs, back (02/16/2017)   Bradycardia 02/2017   Dyspnea    with exertion   Flu 2 weeks ago   GERD (gastroesophageal reflux disease)    Hepatitis A as child   HLD (hyperlipidemia)    Persistent atrial fibrillation (HCC) 12/18/2016   Pneumonia 1999; 2018   Presence of permanent cardiac pacemaker    st jude   Prostate cancer [facility])    Past Surgical History:  Procedure Laterality Date   APPENDECTOMY     age 77   CARDIOVERSION  02/16/2017   CARDIOVERSION N/A 02/16/2017   Procedure: CARDIOVERSION;  Surgeon: Kelsie Agent, MD;  Location: [facility];  Service: Cardiovascular;  Laterality: N/A;   COLONOSCOPY WITH PROPOFOL  N/A 12/13/2012   Procedure: COLONOSCOPY WITH PROPOFOL ;  Surgeon: Gladis MARLA Louder, MD;  Location: [facility];  Service: Endoscopy;  Laterality: N/A;   FRACTURE SURGERY     HYDROCELE EXCISION Left 05/16/2018   Procedure: HYDROCELECTOMY ADULT;  Surgeon: Matilda Senior, MD;  Location: [facility];  Service: Urology;  Laterality: Left;   INSERT / REPLACE / REMOVE PACEMAKER  02/16/2017   KYPHOPLASTY N/A 02/10/2021   Procedure: KYPHOPLASTY L-5;  Surgeon: Mavis Purchase, MD;  Location: [facility] OR;  Service: Neurosurgery;  Laterality: N/A;   LAPAROSCOPY N/A 06/04/2020   Procedure: POSSIBLE LAPAROTOMY POSSIBLE BOWEL RESSECTION;  Surgeon: Signe Mitzie LABOR, MD;  Location: [facility] ORS;  Service: General;  Laterality: N/A;  PACEMAKER IMPLANT N/A 02/16/2017   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 by Dr Kelsie   PROSTATE BIOPSY  11/23/2019   WRIST FRACTURE SURGERY Left    with pins   Social History:  reports that he quit smoking about 58 years ago. His smoking use included  cigarettes. He started smoking about 66 years ago. He has a 1 pack-year smoking history. He has never used smokeless tobacco. He reports current alcohol  use. He reports that he does not use drugs.  Allergies[1]  Family History  Problem Relation Age of Onset   CVA Mother    Coronary artery disease Mother    Stroke Mother    Coronary artery disease Father    Heart attack Father    Stomach cancer Maternal Grandmother    Breast cancer Neg Hx    Prostate cancer Neg Hx    Colon cancer Neg Hx    Pancreatic cancer Neg Hx     Prior to Admission medications  Medication Sig Start Date End Date Taking? Authorizing Provider  acetaminophen  (TYLENOL ) 325 MG tablet Take 2 tablets (650 mg total) by mouth every 6 (six) hours. 02/21/21  Yes Setzer, Sandra J, PA-C  albuterol  (VENTOLIN  HFA) 108 (90 Base) MCG/ACT inhaler Inhale 1-2 puffs into the lungs every 6 (six) hours as needed for wheezing or shortness of breath. 08/23/22  Yes Jerrol Agent, MD  apixaban  (ELIQUIS ) 5 MG TABS tablet TAKE 1 TABLET(5 MG) BY MOUTH TWICE DAILY 12/31/23  Yes Camnitz, Soyla Lunger, MD  calcium  carbonate (TUMS - DOSED IN MG ELEMENTAL CALCIUM ) 500 MG chewable tablet Chew 2 tablets by mouth daily as needed for indigestion or heartburn.   Yes [provider]  lidocaine  (LIDODERM ) 5 % PLACE 2 PATCHED ONTO THE SKIN ONCE DAILY. 12 HOURS ON AND 12 HOURS OFF. 12/07/23  Yes Lovorn, Megan, MD  Multiple Vitamins-Minerals (PRESERVISION AREDS 2) CAPS 1 capsule   Yes [provider]  pantoprazole  (PROTONIX ) 40 MG tablet Take 40 mg by mouth daily. 10/16/16  Yes [provider]  Polyethyl Glycol-Propyl Glycol (SYSTANE) 0.4-0.3 % GEL ophthalmic gel See admin instructions.   Yes [provider]  polyethylene glycol (MIRALAX  / GLYCOLAX ) 17 g packet Take 17 g by mouth daily. 02/21/21  Yes Setzer, Sandra J, PA-C  Propylene Glycol (SYSTANE BALANCE OP) Place 1 drop into both eyes daily as needed (dry eyes).   Yes  [provider]  psyllium (METAMUCIL) 58.6 % packet Take 1 packet by mouth daily.   Yes [provider]  rosuvastatin  (CRESTOR ) 10 MG tablet Take 10 mg by mouth every morning.   Yes [provider]  trolamine salicylate (ASPERCREME) 10 % cream Apply 1 application  topically daily.   Yes [provider]  alendronate (FOSAMAX) 70 MG tablet Take 70 mg by mouth once a week. Patient not taking: Reported on 03/30/2024    [provider]  Cholecalciferol (VITAMIN D3) 125 MCG (5000 UT) CAPS 2 capsules Patient not taking: Reported on 03/30/2024    [provider]  Lidocaine  5 % CREA     [provider]  Multiple Vitamin (MULTIVITAMIN WITH MINERALS) TABS tablet Take 1 tablet by mouth daily. Patient not taking: Reported on 03/30/2024    [provider]  olmesartan (BENICAR) 20 MG tablet Take 20 mg by mouth daily. 10/27/21   [provider]  sodium chloride  (OCEAN) 0.65 % SOLN nasal spray Place 1 spray into both nostrils at bedtime. Patient not  taking: Reported on 03/30/2024    [provider]    Physical Exam: Vitals:   03/30/24 0355 03/30/24 0615 03/30/24 0746 03/30/24 0803  BP:   (!) 185/106 (!) 173/99  Pulse: 81  86 87  Resp:   16 13  Temp:  97.8 F (36.6 C)  99 F (37.2 C)  TempSrc:  Oral  Oral  SpO2: 100%  100% 100%  Weight:      Height:       Physical Exam Vitals and nursing note reviewed.  Constitutional:      General: He is awake. He is not in acute distress.    Appearance: Normal appearance. He is ill-appearing.  HENT:     Head: Normocephalic.     Nose: No rhinorrhea.     Mouth/Throat:     Mouth: Mucous membranes are moist.  Eyes:     General: No scleral icterus.    Pupils: Pupils are equal, round, and reactive to light.  Neck:     Vascular: No JVD.  Cardiovascular:     Rate and Rhythm: Normal rate and regular rhythm.     Heart sounds: S1 normal and S2 normal.  Pulmonary:     Effort:  Pulmonary effort is normal.     Breath sounds: Normal breath sounds. No wheezing, rhonchi or rales.  Abdominal:     General: Bowel sounds are normal. There is no distension.     Palpations: Abdomen is soft.     Tenderness: There is no abdominal tenderness. There is no right CVA tenderness or left CVA tenderness.  Musculoskeletal:     Cervical back: Neck supple.     Right lower leg: No edema.     Left lower leg: No edema.  Skin:    General: Skin is warm and dry.  Neurological:     General: No focal deficit present.     Mental Status: He is alert and oriented to person, place, and time.  Psychiatric:        Mood and Affect: Mood normal.        Behavior: Behavior normal. Behavior is cooperative.     Data Reviewed:  Results are pending, will review when available.  11/05/2022 TTE report: IMPRESSIONS:   1. Left ventricular ejection fraction, by estimation, is 60 to 65%. The  left ventricle has normal function. The left ventricle has no regional  wall motion abnormalities. There is mild left ventricular hypertrophy.  Left ventricular diastolic parameters  are indeterminate. Elevated left atrial pressure.   2. Right ventricular systolic function is normal. The right ventricular  size is mildly enlarged.   3. Left atrial size was severely dilated.   4. Right atrial size was severely dilated.   5. The mitral valve is normal in structure. Mild mitral valve  regurgitation. No evidence of mitral stenosis.   6. Tricuspid valve regurgitation is mild to moderate.   7. The aortic valve is tricuspid. Aortic valve regurgitation is not  visualized. Aortic valve sclerosis is present, with no evidence of aortic  valve stenosis.   8. Aortic dilatation noted. 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   EKG: Vent. rate 81 BPM  PR interval * ms  QRS duration 101 ms  QT/QTcB 316/367 ms  P-R-T axes * 62 -70  Atrial fibrillation  RSR' in V1 or V2, right VCD or  RVH  Nonspecific repol abnormality, lateral leads  Assessment and Plan: Principal Problem:   Closed fracture of  right hip, initial encounter (HCC) Admit to telemetry/inpatient. Ice area as needed. Buck's traction per protocol. Analgesics as needed. Antiemetics as needed. Consult TOC team. Consult nutritional services. PT evaluation after surgery. Orthopedic surgery evaluation appreciated.  Active Problems:   Pulmonary infiltrates And post opiate administration   Hypoxic episode No fever or leukocytosis though. However, the patient is very predisposed to URIs/PNH. I will begin PNA coverage and check procalcitonin.    Constipation MiraLAX  and stool softeners.    Persistent atrial fibrillation (HCC) Holding apixaban .    Hyperlipidemia Continue rosuvastatin  10 mg p.o. daily.    Gastro-esophageal reflux disease without esophagitis Continue pantoprazole  40 mg p.o. daily.    Vitamin D deficiency Continue vitamin D supplementation.    Sacroiliitis Already on analgesics.    Advance Care Planning:   Code Status: Full Code   Consults: Orthopedic surgery.  Family Communication: His wife and daughter were at bedside.  Severity of Illness: The appropriate patient status for this patient is INPATIENT. Inpatient status is judged to be reasonable and necessary in order to provide the required intensity of service to ensure the patient's safety. The patient's presenting symptoms, physical exam findings, and initial radiographic and laboratory data in the context of their chronic comorbidities is felt to place them at high risk for further clinical deterioration. Furthermore, it is not anticipated that the patient will be medically stable for discharge from the hospital within 2 midnights of admission.   * I certify that at the point of admission it is my clinical judgment that the patient will require inpatient hospital care spanning beyond 2 midnights from the point of admission  due to high intensity of service, high risk for further deterioration and high frequency of surveillance required.*  Author: Alm Dorn Castor, MD 03/30/2024 9:27 AM  For on call review [email or web address].   This document was prepared using Dragon voice recognition software and may contain some unintended transcription errors.     [1]  Allergies Allergen Reactions   Atorvastatin Palpitations   Ezetimibe Palpitations   Morphine  And Codeine Other (See Comments)    Pt has hypersensitivity to morphine - makes confused after prolonged usage   "

## 2024-03-30 NOTE — Progress Notes (Signed)
 Asked to see patient by Dr. Melita. Patient presented to [facility] ED following unwitnessed GLF last evening when he was stretching his legs.  His wife found him on the ground.  He had R hip pain and inability to weight bear.  Workup revealed comminuted right peritrochanteric femur fracture.  Hgb 10.2 in the ED.  Patient has h/o osteoporosis, chronic bilateral superior rami stress fractures, and chronic LBP-sees Dr. Mavis, and chronic hip pain which he localizes to the low back / SI joint.  Also takes Eliquis  for AFib.  Patient's right hip fracture is unstable and requires surgical management.  Plan for OR today depending on scheduling availability for IM nail R femur.  NPO, hold Eliquis .  Formal consult to follow.

## 2024-03-30 NOTE — Discharge Instructions (Signed)
 "  Dr. Redell Shoals Adult Hip & Knee Specialist [facility] [address], [address] [place], [place] [address] [phone]   POSTOPERATIVE DIRECTIONS    Hip Rehabilitation, Guidelines Following Surgery   WEIGHT BEARING Weight bearing as tolerated with assist device (walker, cane, etc) as directed, use it as long as suggested by your surgeon or therapist, typically at least 4-6 weeks.   HOME CARE INSTRUCTIONS  Remove items at home which could result in a fall. This includes throw rugs or furniture in walking pathways.  Continue medications as instructed at time of discharge. You may have some home medications which will be placed on hold until you complete the course of blood thinner medication. 4 days after discharge, you may start showering. No tub baths or soaking your incisions. Do not put on socks or shoes without following the instructions of your caregivers.   Sit on chairs with arms. Use the chair arms to help push yourself up when arising.  Arrange for the use of a toilet seat elevator so you are not sitting low.  Walk with walker as instructed.  You may resume a sexual relationship in one month or when given the OK by your caregiver.  Use walker as long as suggested by your caregivers.  Avoid periods of inactivity such as sitting longer than an hour when not asleep. This helps prevent blood clots.  You may return to work once you are cleared by designer, industrial/product.  Do not drive a car for 6 weeks or until released by your surgeon.  Do not drive while taking narcotics.  Wear elastic stockings for two weeks following surgery during the day but you may remove then at night.  Make sure you keep all of your appointments after your operation with all of your doctors and caregivers. You should call the office at the above phone number and make an appointment for approximately two weeks after the date of your surgery. Please pick up a stool softener and laxative for  home use as long as you are requiring pain medications. ICE to the affected hip every three hours for 30 minutes at a time and then as needed for pain and swelling. Continue to use ice on the hip for pain and swelling from surgery. You may notice swelling that will progress down to the foot and ankle.  This is normal after surgery.  Elevate the leg when you are not up walking on it.   It is important for you to complete the blood thinner medication as prescribed by your doctor. Continue to use the breathing machine which will help keep your temperature down.  It is common for your temperature to cycle up and down following surgery, especially at night when you are not up moving around and exerting yourself.  The breathing machine keeps your lungs expanded and your temperature down.  RANGE OF MOTION AND STRENGTHENING EXERCISES  These exercises are designed to help you keep full movement of your hip joint. Follow your caregiver's or physical therapist's instructions. Perform all exercises about fifteen times, three times per day or as directed. Exercise both hips, even if you have had only one joint replacement. These exercises can be done on a training (exercise) mat, on the floor, on a table or on a bed. Use whatever works the best and is most comfortable for you. Use music or television while you are exercising so that the exercises are a pleasant break in your day. This will make your life  better with the exercises acting as a break in routine you can look forward to.  Lying on your back, slowly slide your foot toward your buttocks, raising your knee up off the floor. Then slowly slide your foot back down until your leg is straight again.  Lying on your back spread your legs as far apart as you can without causing discomfort.  Lying on your side, raise your upper leg and foot straight up from the floor as far as is comfortable. Slowly lower the leg and repeat.  Lying on your back, tighten up the muscle  in the front of your thigh (quadriceps muscles). You can do this by keeping your leg straight and trying to raise your heel off the floor. This helps strengthen the largest muscle supporting your knee.  Lying on your back, tighten up the muscles of your buttocks both with the legs straight and with the knee bent at a comfortable angle while keeping your heel on the floor.   SKILLED REHAB INSTRUCTIONS: If the patient is transferred to a skilled rehab facility following release from the hospital, a list of the current medications will be sent to the facility for the patient to continue.  When discharged from the skilled rehab facility, please have the facility set up the patient's Home Health Physical Therapy prior to being released. Also, the skilled facility will be responsible for providing the patient with their medications at time of release from the facility to include their pain medication and their blood thinner medication. If the patient is still at the rehab facility at time of the two week follow up appointment, the skilled rehab facility will also need to assist the patient in arranging follow up appointment in our office and any transportation needs.  MAKE SURE YOU:  Understand these instructions.  Will watch your condition.  Will get help right away if you are not doing well or get worse.  Pick up stool softner and laxative for home use following surgery while on pain medications. Do not change your dressing.  In 4 days, you may begin taking showers - no tub baths or soaking the incisions. Continue to use ice for pain and swelling after surgery. Do not use any lotions or creams on the incision until instructed by your surgeon.  "

## 2024-03-30 NOTE — Plan of Care (Signed)
Plan of care initiated and reviewed.

## 2024-03-30 NOTE — Op Note (Signed)
 OPERATIVE REPORT  SURGEON: Redell Shoals, MD   ASSISTANT: Valery Potters, PA-C.  PREOPERATIVE DIAGNOSIS: Comminuted Right peritrochanteric femur fracture.   POSTOPERATIVE DIAGNOSIS: Comminuted Right peritrochanteric femur fracture.   PROCEDURE: Intramedullary fixation, Right femur.   IMPLANTS: Biomet Affixus Hip Fracture Nail, 11 by 180 mm, 125 degrees. 10.5 x 105 mm Hip Fracture Nail Lag Screw. 5 x 40 mm distal interlocking screw 1.  ANESTHESIA:  General  ESTIMATED BLOOD LOSS:-200 mL    ANTIBIOTICS: 2 g Ancef .  DRAINS: None.  COMPLICATIONS: None.   CONDITION: PACU - hemodynamically stable.[name]   BRIEF CLINICAL NOTE: Todd Chapman is a 79 y.o. male who presented with an intertrochanteric femur fracture. The patient was admitted to the hospitalist service and underwent perioperative risk stratification and medical optimization. The risks, benefits, and alternatives to the procedure were explained, and the patient elected to proceed.  PROCEDURE IN DETAIL: Surgical site was marked by myself. The patient was taken to the operating room and anesthesia was induced on the bed. The patient was then transferred to the [facility] table and the nonoperative lower extremity was scissored underneath the operative side. The fracture was reduced with traction, internal rotation, and adduction.  Due to persistent posterior sag of the shaft, I placed a crutch underneath the femur.  The hip was prepped and draped in the normal sterile surgical fashion. Timeout was called verifying side and site of surgery. Preop antibiotics were given with 60 minutes of beginning the procedure.  Fluoroscopy was used to define the patient's anatomy.  A 4 cm incision was created in line with the femoral neck.  Blunt dissection was performed until I reached the IT band, which I split in line with fibers.  Cobb was used to correct apex anterior angulation of the femoral neck.  A 4 cm incision was made just proximal to the tip of  the greater trochanter. The awl was used to obtain the standard starting point for a trochanteric entry nail under fluoroscopic control. The guidepin was placed. The entry reamer was used to open the proximal femur.  On the back table, the nail was assembled onto the jig. The nail was placed into the femur without any difficulty. Through a separate stab incision, the cannula was placed down to the bone in preparation for the cephalomedullary device. A guidepin was placed into the femoral head using AP and lateral fluoroscopy views. The pin was measured, and then reaming was performed to the appropriate depth. The lag screw was inserted to the appropriate depth. The fracture was compressed through the jig. The setscrew was tightened statically. A separate stab incision was created, and the distal interlocking screw was placed using standard AO technique. The jig was removed. Final AP and lateral fluoroscopy views were obtained to confirm fracture reduction and hardware placement. Tip apex distance was appropriate. There was no chondral penetration.  The wounds were copiously irrigated with saline. The wound was closed in layers with #1 Vicryl for the fascia, 2-0 Monocryl for the deep dermal layer, and staples plus Dermabond for the skin. Once the glue was fully hardened, sterile dressing was applied. The patient was then awakened from anesthesia and taken to the PACU in stable condition. Sponge needle and instrument counts were correct at the end of the case 2. There were no known complications.  We will readmit the patient to the hospitalist. Weightbearing status will be touchdown weightbearing with a walker. We will begin Lovenox  for DVT prophylaxis. The patient will work with physical therapy  and undergo disposition planning.  Please note that a surgical assistant was a medical necessity for this procedure to perform it in a safe and expeditious manner. Assistant was necessary to provide appropriate  retraction of vital neurovascular structures, to prevent femoral fracture, and to allow for anatomic placement of the prosthesis.

## 2024-03-30 NOTE — Transfer of Care (Signed)
 Immediate Anesthesia Transfer of Care Note  Patient: Todd Chapman  Procedure(s) Performed: INSERTION, INTRAMEDULLARY ROD, FEMUR (Right: Leg Upper)  Patient Location: PACU  Anesthesia Type:General  Level of Consciousness: awake and alert   Airway & Oxygen Therapy: Patient Spontanous Breathing and Patient connected to face mask oxygen  Post-op Assessment: Report given to RN and Post -op Vital signs reviewed and stable  Post vital signs: Reviewed and stable  Last Vitals:  Vitals Value Taken Time  BP 175/99 03/30/24 18:45  Temp 36.9 C 03/30/24 18:45  Pulse 78 03/30/24 18:51  Resp 17 03/30/24 18:51  SpO2 100 % 03/30/24 18:51  Vitals shown include unfiled device data.  Last Pain:  Vitals:   03/30/24 1845  TempSrc:   PainSc: Asleep         Complications: No notable events documented.

## 2024-03-31 ENCOUNTER — Encounter (HOSPITAL_COMMUNITY): Payer: Self-pay | Admitting: Orthopedic Surgery

## 2024-03-31 DIAGNOSIS — S72001A Fracture of unspecified part of neck of right femur, initial encounter for closed fracture: Secondary | ICD-10-CM | POA: Diagnosis not present

## 2024-03-31 LAB — PROCALCITONIN: Procalcitonin: <0.1 ng/mL

## 2024-03-31 LAB — STREP PNEUMONIAE URINARY ANTIGEN: Strep Pneumo Urinary Antigen: NEGATIVE

## 2024-03-31 LAB — MAGNESIUM: Magnesium: 1.9 mg/dL (ref 1.7–2.4)

## 2024-03-31 MED ORDER — DOXYCYCLINE HYCLATE 100 MG PO TABS
100.0000 mg | ORAL_TABLET | Freq: Two times a day (BID) | ORAL | Status: DC
  Administered 2024-03-31 – 2024-04-04 (×9): 100 mg via ORAL
  Filled 2024-03-31 (×9): qty 1

## 2024-03-31 MED ORDER — HYDROCODONE-ACETAMINOPHEN 5-325 MG PO TABS: 1.0000 | ORAL_TABLET | ORAL | 0 refills | Status: AC | PRN

## 2024-03-31 NOTE — TOC Initial Note (Signed)
 Transition of Care [facility]) - Initial/Assessment Note    Patient Details  Name: Todd Chapman MRN: [id number] Date of Birth: 1945/07/07  Transition of [facility]) CM/SW Contact:    Sonda Manuella Quill, RN Phone Number: 03/31/2024, 2:33 PM  Clinical Narrative:                 IP CM for d/c planning; PT recc SNF; spoke w/ pt and spouse Cap Massi [phone]); pt normally lives at home w/ his spouse; he agreed to recc SNF; transportation by ambulance; insurance/PCP verified; pt denied SDOH risks; he has cane, crutches, walker, and stairlift; he does not have HH services or home oxygen; explained SNF process; pt/spouse verbalize understanding; they do not have facility preference; obtained PASRR # [id number]; FL2 completed and sent for co-sign; faxed out for SNF bed; awaiting offers. Expected Discharge Plan: Skilled Nursing Facility Barriers to Discharge: Continued Medical Work up   Patient Goals and CMS Choice Patient states their goals for this hospitalization and ongoing recovery are:: short-term rehab CMS Medicare.gov Compare Post Acute Care list provided to:: Patient   [place] ownership interest in [facility].provided to:: Patient    Expected Discharge Plan and Services   Discharge Planning Services: CM Consult   Living arrangements for the past 2 months: Single Family Home                 DME Arranged: N/A DME Agency: NA       HH Arranged: NA HH Agency: NA        Prior Living Arrangements/Services Living arrangements for the past 2 months: Single Family Home Lives with:: Spouse Patient language and need for interpreter reviewed:: Yes Do you feel safe going back to the place where you live?: Yes      Need for Family Participation in Patient Care: Yes (Comment) Care giver support system in place?: Yes (comment) Current home services: DME (cane, crutches, stairlift) Criminal Activity/Legal Involvement Pertinent to Current Situation/Hospitalization:  No - Comment as needed  Activities of Daily Living   ADL Screening (condition at time of admission) Independently performs ADLs?: Yes (appropriate for developmental age) Is the patient deaf or have difficulty hearing?: Yes Does the patient have difficulty seeing, even when wearing glasses/contacts?: No Does the patient have difficulty concentrating, remembering, or making decisions?: No  Permission Sought/Granted Permission sought to share information with : Case Manager Permission granted to share information with : Yes, Verbal Permission Granted  Share Information with NAME: Case Manager     Permission granted to share info w Relationship: Leray Garverick (spouse) [phone]     Emotional Assessment Appearance:: Appears stated age Attitude/Demeanor/Rapport: Gracious Affect (typically observed): Accepting Orientation: : Oriented to Self, Oriented to Place, Oriented to  Time, Oriented to Situation Alcohol  / Substance Use: Not Applicable Psych Involvement: No (comment)  Admission diagnosis:  Closed fracture of right hip, initial encounter [facility]) [S72.001A] Patient Active Problem List   Diagnosis Date Noted   Closed fracture of right hip, initial encounter (HCC) 03/30/2024   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   Sacroiliitis 03/30/2024   Sciatica 03/30/2024   Pulmonary infiltrates 03/30/2024   Hypoxic episode 03/30/2024   Bilateral chronic knee pain 12/23/2022   Age-related osteoporosis without current pathological fracture 03/25/2022   Chronic pain syndrome 03/25/2022   Liver lesion 06/11/2021   Severe sepsis with acute organ dysfunction (HCC) 06/09/2021   Multifocal pneumonia 06/09/2021   Acute respiratory failure with hypoxia (HCC) 06/09/2021   Lumbar radiculopathy 05/02/2021  Vertebral compression fracture (HCC) 02/12/2021   Bilateral acetabular fractures, closed, initial encounter (HCC) 02/08/2021   Closed compression fracture of L5 lumbar vertebra, initial  encounter (HCC) 02/08/2021   Back pain 02/06/2021   SBO (small bowel obstruction) (HCC) 06/01/2020   Pacemaker 05/13/2020   Malignant neoplasm of prostate (HCC) 01/09/2020   Annual physical exam 01/08/2020   Acute upper respiratory infection 01/08/2020   Asymptomatic varicose veins of lower extremity 01/08/2020   Bronchitis 01/08/2020   Cataract 01/08/2020   Constipation 01/08/2020   Diverticulosis of large intestine without perforation or abscess without bleeding 01/08/2020   Screening for gout 01/08/2020   False positive serological test for syphilis 01/08/2020   Gastro-esophageal reflux disease without esophagitis 01/08/2020   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   History of colonic polyps 01/08/2020   Impacted cerumen 01/08/2020   Insomnia 01/08/2020   Osteoarthritis 01/08/2020   Polypharmacy 01/08/2020   Pure hypercholesterolemia 01/08/2020   Rosacea 01/08/2020   Combined forms of age-related cataract of both eyes 07/13/2019   Elevated blood pressure reading 11/08/2018   Sick sinus syndrome (HCC) 02/16/2017   Persistent atrial fibrillation (HCC) 12/18/2016   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   Nonexudative age-related macular degeneration, bilateral, 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   Dermatochalasis of both upper eyelids 07/25/2014   Nuclear sclerosis of both eyes 07/25/2014   Bradycardia 05/29/2014   Premature atrial contractions 05/29/2014   Premature junctional contractions (HCC) 05/29/2014   Hyperlipidemia 05/29/2014   Encounter for general adult medical examination with abnormal findings 04/24/2014   Vitamin D deficiency 04/24/2014   PCP:  Charlott Dorn LABOR, MD Pharmacy:   [id number] [place], [place] - [address] AT [facility] [address] & [facility] [address] [place] [address] Phone: [phone] Fax: [phone]     Social Drivers of Health (SDOH) Social History: SDOH Screenings   Food Insecurity: No Food Insecurity  (03/31/2024)  Housing: Low Risk (03/31/2024)  Transportation Needs: No Transportation Needs (03/31/2024)  Utilities: Not At Risk (03/31/2024)  Depression (PHQ2-9): Low Risk (12/23/2022)  Social Connections: Moderately Integrated (03/30/2024)  Tobacco Use: Medium Risk (03/30/2024)   SDOH Interventions: Food Insecurity Interventions: Intervention Not Indicated, Inpatient TOC Housing Interventions: Intervention Not Indicated, Inpatient TOC Transportation Interventions: Intervention Not Indicated, Inpatient TOC Utilities Interventions: Intervention Not Indicated, Inpatient TOC   Readmission Risk Interventions    03/31/2024    2:11 PM  Readmission Risk Prevention Plan  Transportation Screening Complete  PCP or Specialist Appt within 5-7 Days Complete  Home Care Screening Complete  Medication Review (RN CM) Complete

## 2024-03-31 NOTE — Evaluation (Signed)
 Physical Therapy Evaluation Patient Details Name: Todd Chapman MRN: [id number] DOB: Sep 15, 1945 Today's Date: 03/31/2024  History of Present Illness  79 y.o. male  who was brought via EMS from home after waking up with sudden, severe RLE pain associated with spasms after being found on the floor by his wife. Dx of comminuted IT hip fx, s/p IM fixation 03/30/24. with medical history significant of osteoarthritis, bradycardia, sick sinus syndrome, pacemaker placement, influenza A, hepatitis A, constipation, GERD, diverticulosis, hyperlipidemia, history of multifocal pneumonia, small bowel obstruction, prostate cancer, polypharmacy, rosacea.  Clinical Impression  Pt admitted with above diagnosis. Pt not able to tolerate minimal movement of RLE with oral pain medication. Following administration of IV pain medication, supine to sit with +2 total assist was performed. Attempted sit to stand x 2 with TDWB status on R, pt was unable to come to full standing position. Pt performed gentle AAROM R hip with increased time. All mobility limited by pain. Pt puts forth good effort. Pt currently with functional limitations due to the deficits listed below (see PT Problem List). Pt will benefit from acute skilled PT to increase their independence and safety with mobility to allow discharge.           If plan is discharge home, recommend the following: Two people to help with walking and/or transfers;A lot of help with bathing/dressing/bathroom;Assistance with cooking/housework;Assist for transportation;Help with stairs or ramp for entrance   Can travel by private vehicle   No    Equipment Recommendations Rolling walker (2 wheels)  Recommendations for Other Services       Functional Status Assessment Patient has had a recent decline in their functional status and demonstrates the ability to make significant improvements in function in a reasonable and predictable amount of time.     Precautions /  Restrictions Precautions Precautions: Fall Recall of Precautions/Restrictions: Intact Precaution/Restrictions Comments: pt reports he did not fall with this hip fx, it was due to poor bone quality Restrictions Weight Bearing Restrictions Per Provider Order: Yes RLE Weight Bearing Per Provider Order: Touchdown weight bearing      Mobility  Bed Mobility Overal bed mobility: Needs Assistance Bed Mobility: Supine to Sit     Supine to sit: +2 for physical assistance, Total assist     General bed mobility comments: pt unable to advance RLE to L or to R with gait belt as a leg lifter and with PT assisting despite multiple attempts 2* pain; performed helicopter +2 total assist for supine to sit    Transfers Overall transfer level: Needs assistance Equipment used: Rolling walker (2 wheels) Transfers: Sit to/from Stand Sit to Stand: +2 physical assistance, Max assist           General transfer comment: attempted sit to stand x 2 with +2 max assist, pt able to come to 1/2 squat position but not to full upright position    Ambulation/Gait                  Stairs            Wheelchair Mobility     Tilt Bed    Modified Rankin (Stroke Patients Only)       Balance Overall balance assessment: Needs assistance Sitting-balance support: Feet supported, Bilateral upper extremity supported Sitting balance-Leahy Scale: Fair       Standing balance-Leahy Scale: Zero  Pertinent Vitals/Pain Pain Assessment Pain Assessment: 0-10 Pain Score: 9  Pain Location: R hip Pain Descriptors / Indicators: Sore Pain Intervention(s): Limited activity within patient's tolerance, Monitored during session, Premedicated before session, Patient requesting pain meds-RN notified, Repositioned, Ice applied    Home Living Family/patient expects to be discharged to:: Skilled nursing facility Living Arrangements: Spouse/significant  other Available Help at Discharge: Family;Available PRN/intermittently Type of Home: House Home Access: Stairs to enter Entrance Stairs-Rails: Right Entrance Stairs-Number of Steps: 2 Alternate Level Stairs-Number of Steps: has stair lift Home Layout: Two level   Additional Comments: lives with wife who works    Prior Function Prior Level of Function : Independent/Modified Independent             Mobility Comments: walks without AD, denies h/o falls, pt is a clinical research associate, works as a warehouse manager ADLs Comments: independent     Extremity/Trunk Assessment   Upper Extremity Assessment Upper Extremity Assessment: Overall WFL for tasks assessed    Lower Extremity Assessment Lower Extremity Assessment: RLE deficits/detail RLE Deficits / Details: tolerated only ~10* hip flexion and ~10* hip abduction AAROM, limited by pain RLE Sensation: WNL    Cervical / Trunk Assessment Cervical / Trunk Assessment: Kyphotic  Communication   Communication Communication: No apparent difficulties    Cognition Arousal: Alert Behavior During Therapy: WFL for tasks assessed/performed   PT - Cognitive impairments: No apparent impairments                         Following commands: Intact       Cueing       General Comments      Exercises General Exercises - Lower Extremity Ankle Circles/Pumps: AROM, Both, 10 reps, Supine Heel Slides: AAROM, Right, 5 reps, Supine Hip ABduction/ADduction: AAROM, Right, 5 reps, Supine   Assessment/Plan    PT Assessment Patient needs continued PT services  PT Problem List Decreased strength;Decreased range of motion;Decreased activity tolerance;Decreased mobility;Pain;Decreased balance       PT Treatment Interventions Gait training;Therapeutic exercise;Functional mobility training;Therapeutic activities;Patient/family education    PT Goals (Current goals can be found in the Care Plan section)  Acute Rehab PT Goals 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,  return to mediation work PT Goal Formulation: With patient/family Time For Goal Achievement: 04/14/24 Potential to Achieve Goals: Good    Frequency Min 3X/week     Co-evaluation               AM-PAC PT 6 Clicks Mobility  Outcome Measure Help needed turning from your back to your side while in a flat bed without using bedrails?: Total Help needed moving from lying on your back to sitting on the side of a flat bed without using bedrails?: Total Help needed moving to and from a bed to a chair (including a wheelchair)?: Total Help needed standing up from a chair using your arms (e.g., wheelchair or bedside chair)?: Total Help needed to walk in hospital room?: Total Help needed climbing 3-5 steps with a railing? : Total 6 Click Score: 6    End of Session Equipment Utilized During Treatment: Gait belt Activity Tolerance: Patient limited by pain Patient left: in bed;with bed alarm set;with call bell/phone within reach;with family/visitor present Nurse Communication: Mobility status PT Visit Diagnosis: Difficulty in walking, not elsewhere classified (R26.2);Pain Pain - Right/Left: Right Pain - part of body: Hip    Time: 8871-8756 PT Time Calculation (min) (ACUTE ONLY): 75 min   Charges:   PT Evaluation $  PT Eval Moderate Complexity: 1 Mod PT Treatments $Therapeutic Exercise: 23-37 mins $Therapeutic Activity: 23-37 mins PT General Charges $$ ACUTE PT VISIT: 1 Visit         Sylvan Delon Copp PT 03/31/2024  Acute Rehabilitation Services  Office [phone]

## 2024-03-31 NOTE — Anesthesia Postprocedure Evaluation (Signed)
"   Anesthesia Post Note  Patient: Todd Chapman  Procedure(s) Performed: INSERTION, INTRAMEDULLARY ROD, FEMUR (Right: Leg Upper)     Patient location during evaluation: PACU Anesthesia Type: General Level of consciousness: awake and alert Pain management: pain level controlled Vital Signs Assessment: post-procedure vital signs reviewed and stable Respiratory status: spontaneous breathing, nonlabored ventilation, respiratory function stable and patient connected to nasal cannula oxygen Cardiovascular status: blood pressure returned to baseline and stable Postop Assessment: no apparent nausea or vomiting Anesthetic complications: no   No notable events documented.  Last Vitals:  Vitals:   03/31/24 0239 03/31/24 0606  BP: 107/68 (!) 143/80  Pulse: 81 73  Resp: 16 16  Temp: 37.3 C 36.9 C  SpO2: 100% 100%    Last Pain:  Vitals:   03/31/24 0606  TempSrc: Oral  PainSc:                  Todd Chapman      "

## 2024-03-31 NOTE — Plan of Care (Signed)
" °  Problem: Education: Goal: Knowledge of General Education information will improve Description: Including pain rating scale, medication(s)/side effects and non-pharmacologic comfort measures Outcome: Progressing   Problem: Health Behavior/Discharge Planning: Goal: Ability to manage health-related needs will improve Outcome: Progressing   Problem: Clinical Measurements: Goal: Ability to maintain clinical measurements within normal limits will improve Outcome: Progressing Goal: Will remain free from infection Outcome: Progressing Goal: Diagnostic test results will improve Outcome: Progressing Goal: Respiratory complications will improve Outcome: Progressing Goal: Cardiovascular complication will be avoided Outcome: Progressing   Problem: Activity: Goal: Risk for activity intolerance will decrease Outcome: Progressing   Problem: Nutrition: Goal: Adequate nutrition will be maintained Outcome: Progressing   Problem: Coping: Goal: Level of anxiety will decrease Outcome: Progressing   Problem: Elimination: Goal: Will not experience complications related to bowel motility Outcome: Progressing Goal: Will not experience complications related to urinary retention Outcome: Progressing   Problem: Pain Managment: Goal: General experience of comfort will improve and/or be controlled Outcome: Progressing   Problem: Safety: Goal: Ability to remain free from injury will improve Outcome: Progressing   Problem: Skin Integrity: Goal: Risk for impaired skin integrity will decrease Outcome: Progressing   Problem: Education: Goal: Knowledge of the prescribed therapeutic regimen will improve Outcome: Progressing   Problem: Bowel/Gastric: Goal: Gastrointestinal status for postoperative course will improve Outcome: Progressing   Problem: Cardiac: Goal: Ability to maintain an adequate cardiac output Outcome: Progressing Goal: Will show no evidence of cardiac arrhythmias Outcome:  Progressing   Problem: Nutritional: Goal: Will attain and maintain optimal nutritional status Outcome: Progressing   Problem: Neurological: Goal: Will regain or maintain usual level of consciousness Outcome: Progressing   Problem: Clinical Measurements: Goal: Ability to maintain clinical measurements within normal limits Outcome: Progressing Goal: Postoperative complications will be avoided or minimized Outcome: Progressing   Problem: Respiratory: Goal: Will regain and/or maintain adequate ventilation Outcome: Progressing Goal: Respiratory status will improve Outcome: Progressing   Problem: Skin Integrity: Goal: Demonstrates signs of wound healing without infection Outcome: Progressing   Problem: Urinary Elimination: Goal: Will remain free from infection Outcome: Progressing Goal: Ability to achieve and maintain adequate urine output Outcome: Progressing   Problem: Activity: Goal: Ability to tolerate increased activity will improve Outcome: Progressing   Problem: Clinical Measurements: Goal: Ability to maintain a body temperature in the normal range will improve Outcome: Progressing   Problem: Respiratory: Goal: Ability to maintain adequate ventilation will improve Outcome: Progressing Goal: Ability to maintain a clear airway will improve Outcome: Progressing   Problem: Education: Goal: Verbalization of understanding the information provided (i.e., activity precautions, restrictions, etc) will improve Outcome: Progressing Goal: Individualized Educational Video(s) Outcome: Progressing   Problem: Activity: Goal: Ability to ambulate and perform ADLs will improve Outcome: Progressing   Problem: Clinical Measurements: Goal: Postoperative complications will be avoided or minimized Outcome: Progressing   Problem: Self-Concept: Goal: Ability to maintain and perform role responsibilities to the fullest extent possible will improve Outcome: Progressing   Problem:  Pain Management: Goal: Pain level will decrease Outcome: Progressing   "

## 2024-03-31 NOTE — Progress Notes (Signed)
 Initial Nutrition Assessment  DOCUMENTATION CODES:   Obesity unspecified  INTERVENTION:  - 2 gram sodium diet.  - Encourage intake of 3 meals a day, emphasizing protein rich food sources. - Monitor weight trends.    NUTRITION DIAGNOSIS:   Increased nutrient needs related to hip fracture as evidenced by estimated needs.  GOAL:  Patient will meet greater than or equal to 90% of their needs  MONITOR:   PO intake, Weight trends  REASON FOR ASSESSMENT:   Consult Hip fracture protocol  ASSESSMENT:   79 y.o. male with PMH significant of osteoarthritis, bradycardia, sick sinus syndrome, pacemaker placement, hepatitis A, constipation, GERD, diverticulosis, HLD, small bowel obstruction, prostate cancer, polypharmacy, rosacea who presented after waking up with sudden severe RLE pain after being found on the floor. Admitted for closed right hip fracture.   Patient reports a recent UBW of 215# and that his weight has been stable. EMR confirms weight stable over the past year.   Patient endorses typically eating 3 meals a day. Wife at bedside reports they like to eat a lot of breakfast foods but otherwise try to follow a mostly Mediterranean diet. Eating well PTA.   Current appetite is good and patient reports eating well at breakfast this morning. Documented to have consumed 100%. Will liberalize from heart healthy to 2g sodium as patient reports he does try and watch his salt.  Discussed increased nutrient needs to promote healing and encouraged intake of protein rich foods at all meals.    Medications reviewed and include: Colace, Senokot  Labs reviewed:  -   NUTRITION - FOCUSED PHYSICAL EXAM:  Unable to complete  Diet Order:   Diet Order             Diet 2 gram sodium Room service appropriate? Yes; Fluid consistency: Thin  Diet effective now                   EDUCATION NEEDS:  Education needs have been addressed  Skin:  Skin Assessment: Skin Integrity  Issues: Skin Integrity Issues:: Incisions Incisions: Right Hip  Last BM:  PTA  Height:  Ht Readings from Last 1 Encounters:  03/30/24 5' 11 (1.803 m)   Weight:  Wt Readings from Last 1 Encounters:  03/30/24 98.4 kg   Ideal Body Weight:  78.18 kg  BMI:  Body mass index is 30.26 kg/m.  Estimated Nutritional Needs:  Kcal:  1900-2100 kcals Protein:  85-100 grams Fluid:  >/= 1.9L    Trude Ned RD, LDN Contact via Secure Chat.

## 2024-03-31 NOTE — Progress Notes (Signed)
 " Progress Note   Patient: Todd Chapman FMW:[id number] DOB: 1945/05/27 DOA: 03/30/2024     1 DOS: the patient was seen and examined on 03/31/2024    Brief hospital course: Todd Chapman is a 79 y.o. male with PMH of osteoarthritis, bradycardia, sick sinus syndrome, pacemaker placement, influenza A, hepatitis A, constipation, GERD, diverticulosis, hyperlipidemia, history of multifocal pneumonia, small bowel obstruction, prostate cancer, polypharmacy, rosacea who was brought via EMS from home after waking up with sudden, severe RLE pain associated with spasms after being found on the floor by his wife.  The patient does not have any recollection of what happened. He denies falling.   Right hip x-ray showing comminuted intertrochanteric right hip fracture. Portable 1 view chest radiograph showing mild diffuse interstitial opacities, lower lobe predominant, raising concern for multifocal infection/pneumonia.   Assessment and Plan:  Closed fracture of right hip, initial encounter [facility]) Admission x-ray revealed comminuted intertrochanteric right hip fracture Orthopedic surgery consulted. Patient underwent intramedullary fixation of the right femur on 1/22. Seen by PT. SNF recommended.  - Touch down weight bearing.  - Continue pain management.  - DVT ppx with Eliquis  2.5 mg BID for 72 hours and then resume baseline Eliquis .  - Hold alendronate for 6-12 months.   Hypoxia Due to pneumonia - Continue antibiotics. - Continue supplemental oxygen and wean as tolerated.  Pneumonia Patient presented with hypoxia. Chest x-ray shows infiltrates concerning for pneumonia. Aspiration seems likely in the setting of alcohol  use. - Continue antibiotics.      Constipation MiraLAX  and stool softeners.   Persistent atrial fibrillation (HCC) Low-dose apixaban  for now and then resume home dose per orthopedics.     Hyperlipidemia Continue rosuvastatin  10 mg p.o. daily.     Gastro-esophageal reflux  disease without esophagitis Continue pantoprazole  40 mg p.o. daily.     Vitamin D deficiency Continue vitamin D supplementation.     Sacroiliitis Already on analgesics.  Alcohol  use disorder Patient presented with blood alcohol  level of 103 mg/dL This would suggest that he had at least 4-6 drinks prior to his presentation. -Encourage alcohol  cessation. - Will watch for signs of withdrawal.     Subjective: Patient complains of right hip pain with movement.  States he has a history of aspiration pneumonitis.  Physical Exam: BP (!) 145/94 (BP Location: Left Arm)   Pulse 77   Temp 98.8 F (37.1 C) (Oral)   Resp 18   Ht 5' 11 (1.803 m)   Wt 98.4 kg   SpO2 100%   BMI 30.26 kg/m     General: Alert, oriented X3  Eyes: Pupils equal, reactive  Oral cavity: moist mucous membranes  Head: Atraumatic, normocephalic  Neck: supple  Chest: clear to auscultation. No crackles, no wheezes  CVS: S1,S2 RRR. No murmurs  Abd: No distention, soft, non-tender. No masses palpable  Extr: No edema   MSK: No joint deformities or swelling  Neurological: Grossly intact.    Data Reviewed:    Latest Ref Rng & Units 03/30/2024    2:44 AM 08/23/2022   12:38 PM 06/11/2021    4:54 AM  CBC  WBC 4.0 - 10.5 K/uL 5.3  9.6  6.0   Hemoglobin 13.0 - 17.0 g/dL 89.7  88.3  8.7   Hematocrit 39.0 - 52.0 % 31.7  34.1  27.0   Platelets 150 - 400 K/uL 193  152  119       Latest Ref Rng & Units 03/30/2024    2:44 AM 08/23/2022  1:01 PM 06/11/2021    4:54 AM  BMP  Glucose 70 - 99 mg/dL 872  883  882   BUN 8 - 23 mg/dL 15  17  23    Creatinine 0.61 - 1.24 mg/dL 9.16  9.25  9.22   Sodium 135 - 145 mmol/L 135  131  134   Potassium 3.5 - 5.1 mmol/L 4.0  4.3  4.5   Chloride 98 - 111 mmol/L 100  97  103   CO2 22 - 32 mmol/L 24  28  26    Calcium  8.9 - 10.3 mg/dL 9.9  89.4  9.9      Family Communication:   Disposition: Status is: Inpatient DVT ppx: Eliquis .      Author: MDALA-GAUSI, Avenell Sellers AGATHA,  MD 03/31/2024 2:28 PM  For on call review [email or web address].    "

## 2024-03-31 NOTE — Progress Notes (Signed)
" ° ° °  Subjective:  Patient reports pain as mild.  Denies N/V/CP/SOB/Abd pain. Denies tingling or numbness in LE bilaterally.  Void pending.  Discussed TDWB precautions.  Up with PT.   Objective:   VITALS:   Vitals:   03/30/24 2246 03/31/24 0239 03/31/24 0606 03/31/24 0936  BP: 111/72 107/68 (!) 143/80 120/75  Pulse: 80 81 73 82  Resp: 16 16 16 18   Temp: 98.9 F (37.2 C) 99.1 F (37.3 C) 98.4 F (36.9 C) 98 F (36.7 C)  TempSrc: Oral Oral Oral Oral  SpO2: 97% 100% 100% 98%  Weight:      Height:        NAD Neurologically intact ABD soft Neurovascular intact Sensation intact distally Intact pulses distally Dorsiflexion/Plantar flexion intact No cellulitis present Compartment soft Incisions C/D/I x3.  Mild edema in LE bilaterally  Lab Results  Component Value Date   WBC 5.3 03/30/2024   HGB 10.2 (L) 03/30/2024   HCT 31.7 (L) 03/30/2024   MCV 94.1 03/30/2024   PLT 193 03/30/2024   BMET    Component Value Date/Time   NA 135 03/30/2024 0244   NA 141 02/08/2017 1417   K 4.0 03/30/2024 0244   CL 100 03/30/2024 0244   CO2 24 03/30/2024 0244   GLUCOSE 127 (H) 03/30/2024 0244   BUN 15 03/30/2024 0244   BUN 14 02/08/2017 1417   CREATININE 0.83 03/30/2024 0244   CALCIUM  9.9 03/30/2024 0244   CALCIUM  9.1 02/12/2021 0213   GFRNONAA >60 03/30/2024 0244     Assessment/Plan: 1 Day Post-Op   Principal Problem:   Closed fracture of right hip, initial encounter (HCC) Active Problems:   Hyperlipidemia   Persistent atrial fibrillation (HCC)   Constipation   Gastro-esophageal reflux disease without esophagitis   Vitamin D deficiency   Sacroiliitis   Pulmonary infiltrates   Hypoxic episode   TDWB with walker.  DVT ppx: Eliquis  2.5 mg for 72 hours then resume baseline Eliquis . , SCDs, TEDS PO pain control PT/OT: To come today.  Dispo:  - Patient under care of the medical team. Disposition per their recommendation.  - Pain medication printed in chart, DVT ppx  resume home dose after 72 hours.  - Hold alendronate 77mo - 12 months.    Valery GORMAN Potters 03/31/2024, 10:07 AM   [facility]  [place] [address], [place], [place] [address] Phone: [phone] [email or web address] Facebook  [employer]       "

## 2024-03-31 NOTE — NC FL2 (Signed)
 " [place]  MEDICAID FL2 LEVEL OF CARE FORM     IDENTIFICATION  Patient Name: Todd Chapman Birthdate: 08/18/1945 Sex: male Admission Date (Current Location): 03/30/2024  [facility] and [place] Number:  Producer, Television/film/video and Address:  [facility],  [address], [place] [address]      Provider Number: [id number]  Attending Physician Name and Address:  Jearlean, Masiku Agat*  Relative Name and Phone Number:  Finley Chevez (spouse) [phone]    Current Level of Care: Hospital Recommended Level of Care: Skilled Nursing Facility Prior Approval Number:    Date Approved/Denied:   PASRR Number: [id number]  Discharge Plan: SNF    Current Diagnoses: Patient Active Problem List   Diagnosis Date Noted   Closed fracture of right hip, initial encounter (HCC) 03/30/2024   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   Sacroiliitis 03/30/2024   Sciatica 03/30/2024   Pulmonary infiltrates 03/30/2024   Hypoxic episode 03/30/2024   Bilateral chronic knee pain 12/23/2022   Age-related osteoporosis without current pathological fracture 03/25/2022   Chronic pain syndrome 03/25/2022   Liver lesion 06/11/2021   Severe sepsis with acute organ dysfunction (HCC) 06/09/2021   Multifocal pneumonia 06/09/2021   Acute respiratory failure with hypoxia (HCC) 06/09/2021   Lumbar radiculopathy 05/02/2021   Vertebral compression fracture (HCC) 02/12/2021   Bilateral acetabular fractures, closed, initial encounter (HCC) 02/08/2021   Closed compression fracture of L5 lumbar vertebra, initial encounter (HCC) 02/08/2021   Back pain 02/06/2021   SBO (small bowel obstruction) (HCC) 06/01/2020   Pacemaker 05/13/2020   Malignant neoplasm of prostate (HCC) 01/09/2020   Annual physical exam 01/08/2020   Acute upper respiratory infection 01/08/2020   Asymptomatic varicose veins of lower extremity 01/08/2020   Bronchitis 01/08/2020   Cataract 01/08/2020   Constipation 01/08/2020    Diverticulosis of large intestine without perforation or abscess without bleeding 01/08/2020   Screening for gout 01/08/2020   False positive serological test for syphilis 01/08/2020   Gastro-esophageal reflux disease without esophagitis 01/08/2020   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   History of colonic polyps 01/08/2020   Impacted cerumen 01/08/2020   Insomnia 01/08/2020   Osteoarthritis 01/08/2020   Polypharmacy 01/08/2020   Pure hypercholesterolemia 01/08/2020   Rosacea 01/08/2020   Combined forms of age-related cataract of both eyes 07/13/2019   Elevated blood pressure reading 11/08/2018   Sick sinus syndrome (HCC) 02/16/2017   Persistent atrial fibrillation (HCC) 12/18/2016   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   Nonexudative age-related macular degeneration, bilateral, 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   Dermatochalasis of both upper eyelids 07/25/2014   Nuclear sclerosis of both eyes 07/25/2014   Bradycardia 05/29/2014   Premature atrial contractions 05/29/2014   Premature junctional contractions (HCC) 05/29/2014   Hyperlipidemia 05/29/2014   Encounter for general adult medical examination with abnormal findings 04/24/2014   Vitamin D deficiency 04/24/2014    Orientation RESPIRATION BLADDER Height & Weight     Self, Time, Situation, Place  Normal Continent Weight: 98.4 kg Height:  5' 11 (180.3 cm)  BEHAVIORAL SYMPTOMS/MOOD NEUROLOGICAL BOWEL NUTRITION STATUS      Continent Diet (2 gram sodium)  AMBULATORY STATUS COMMUNICATION OF NEEDS Skin   Limited Assist Verbally Surgical wounds (surgical incision right hip)                       Personal Care Assistance Level of Assistance  Bathing, Feeding, Dressing Bathing Assistance: Limited assistance Feeding assistance: Independent Dressing Assistance: Limited assistance  Functional Limitations Info  Sight, Hearing, Speech Sight Info: Impaired (glasses) Hearing Info: Adequate Speech Info: Adequate    SPECIAL  CARE FACTORS FREQUENCY  PT (By licensed PT), OT (By licensed OT)     PT Frequency: 5x/week OT Frequency: 5x/week            Contractures Contractures Info: Not present    Additional Factors Info  Code Status, Allergies Code Status Info: Full Code Allergies Info: Atorvastatin, Ezetimibe, Morphine  And Codeine           Current Medications (03/31/2024):  This is the current hospital active medication list Current Facility-Administered Medications  Medication Dose Route Frequency Provider Last Rate Last Admin   0.9 %  sodium chloride  infusion   Intravenous Continuous Hill, Avery S, PA-C 75 mL/hr at 03/30/24 2322 New Bag at 03/30/24 2322   acetaminophen  (TYLENOL ) tablet 325-650 mg  325-650 mg Oral Q6H PRN Hill, Avery S, PA-C       apixaban  (ELIQUIS ) tablet 2.5 mg  2.5 mg Oral Q12H Hill, Valery RAMAN, PA-C   2.5 mg at 03/31/24 0915   bisacodyl  (DULCOLAX) suppository 10 mg  10 mg Rectal Daily PRN Hill, Avery S, PA-C       calcium  carbonate (TUMS - dosed in mg elemental calcium ) chewable tablet 400 mg of elemental calcium   2 tablet Oral Daily PRN Celinda Alm Lot, MD   400 mg of elemental calcium  at 03/31/24 1355   cefTRIAXone  (ROCEPHIN ) 2 g in sodium chloride  0.9 % 100 mL IVPB  2 g Intravenous Q24H Leigh Valery RAMAN, PA-C 200 mL/hr at 03/31/24 1353 2 g at 03/31/24 1353   diphenhydrAMINE  (BENADRYL ) 12.5 MG/5ML elixir 12.5-25 mg  12.5-25 mg Oral Q4H PRN Leigh Valery RAMAN, PA-C       docusate sodium  (COLACE) capsule 100 mg  100 mg Oral BID Leigh Valery S, PA-C   100 mg at 03/31/24 0915   doxycycline  (VIBRA -TABS) tablet 100 mg  100 mg Oral Q12H Mdala-Gausi, Masiku Agatha, MD   100 mg at 03/31/24 1036   HYDROcodone -acetaminophen  (NORCO) 7.5-325 MG per tablet 1-2 tablet  1-2 tablet Oral Q4H PRN Leigh Valery RAMAN, PA-C   1 tablet at 03/31/24 1036   HYDROcodone -acetaminophen  (NORCO/VICODIN) 5-325 MG per tablet 1-2 tablet  1-2 tablet Oral Q4H PRN Leigh Valery RAMAN, PA-C   1 tablet at 03/31/24 9444   menthol   (CEPACOL) lozenge 3 mg  1 lozenge Oral PRN Leigh Valery RAMAN, PA-C       Or   phenol (CHLORASEPTIC) mouth spray 1 spray  1 spray Mouth/Throat PRN Leigh Valery RAMAN, PA-C       methocarbamol  (ROBAXIN ) tablet 500 mg  500 mg Oral Q6H PRN Leigh Valery RAMAN, PA-C   500 mg at 03/31/24 9084   Or   methocarbamol  (ROBAXIN ) injection 500 mg  500 mg Intravenous Q6H PRN Leigh Valery RAMAN, PA-C       metoCLOPramide  (REGLAN ) tablet 5-10 mg  5-10 mg Oral Q8H PRN Hill, Avery S, PA-C       Or   metoCLOPramide  (REGLAN ) injection 5-10 mg  5-10 mg Intravenous Q8H PRN Hill, Avery S, PA-C       morphine  (PF) 2 MG/ML injection 0.5-1 mg  0.5-1 mg Intravenous Q2H PRN Hill, Avery S, PA-C   1 mg at 03/31/24 1213   ondansetron  (ZOFRAN ) tablet 4 mg  4 mg Oral Q6H PRN Leigh Valery RAMAN, PA-C       Or   ondansetron  (ZOFRAN ) injection 4 mg  4  mg Intravenous Q6H PRN Hill, Avery S, PA-C       pantoprazole  (PROTONIX ) EC tablet 40 mg  40 mg Oral Daily Leigh Valery RAMAN, PA-C   40 mg at 03/31/24 0915   polyethylene glycol (MIRALAX  / GLYCOLAX ) packet 17 g  17 g Oral Daily PRN Hill, Avery S, PA-C       psyllium (HYDROCIL/METAMUCIL) 1 packet  1 packet Oral Daily Leigh Valery RAMAN, PA-C   1 packet at 03/31/24 1001   rosuvastatin  (CRESTOR ) tablet 10 mg  10 mg Oral q morning Leigh Valery RAMAN, PA-C   10 mg at 03/31/24 9085   senna-docusate (Senokot-S) tablet 1 tablet  1 tablet Oral BID Leigh Valery RAMAN, PA-C   1 tablet at 03/31/24 9084     Discharge Medications: Please see discharge summary for a list of discharge medications.  Relevant Imaging Results:  Relevant Lab Results:   Additional Information SSN [id number]  Sonda Manuella Quill, RN     "

## 2024-04-01 DIAGNOSIS — S72001A Fracture of unspecified part of neck of right femur, initial encounter for closed fracture: Secondary | ICD-10-CM | POA: Diagnosis not present

## 2024-04-01 LAB — CBC
HCT: 23.4 % — ABNORMAL LOW (ref 39.0–52.0)
Hemoglobin: 7.2 g/dL — ABNORMAL LOW (ref 13.0–17.0)
MCH: 29.5 pg (ref 26.0–34.0)
MCHC: 30.8 g/dL (ref 30.0–36.0)
MCV: 95.9 fL (ref 80.0–100.0)
Platelets: 152 10*3/uL (ref 150–400)
RBC: 2.44 MIL/uL — ABNORMAL LOW (ref 4.22–5.81)
RDW: 14.3 % (ref 11.5–15.5)
WBC: 7 10*3/uL (ref 4.0–10.5)
nRBC: 0 % (ref 0.0–0.2)

## 2024-04-01 LAB — BASIC METABOLIC PANEL WITH GFR
Anion gap: 6 (ref 5–15)
BUN: 18 mg/dL (ref 8–23)
CO2: 24 mmol/L (ref 22–32)
Calcium: 9.4 mg/dL (ref 8.9–10.3)
Chloride: 99 mmol/L (ref 98–111)
Creatinine, Ser: 0.66 mg/dL (ref 0.61–1.24)
GFR, Estimated: >60 mL/min
Glucose, Bld: 126 mg/dL — ABNORMAL HIGH (ref 70–99)
Potassium: 4.8 mmol/L (ref 3.5–5.1)
Sodium: 130 mmol/L — ABNORMAL LOW (ref 135–145)

## 2024-04-01 MED ORDER — APIXABAN 5 MG PO TABS: 5.0000 mg | ORAL_TABLET | Freq: Two times a day (BID) | ORAL | Status: DC

## 2024-04-01 MED ORDER — APIXABAN 2.5 MG PO TABS
2.5000 mg | ORAL_TABLET | Freq: Two times a day (BID) | ORAL | Status: DC
  Administered 2024-04-01 – 2024-04-02 (×2): 2.5 mg via ORAL
  Filled 2024-04-01 (×2): qty 1

## 2024-04-01 NOTE — Progress Notes (Signed)
 " Progress Note   Patient: Todd Chapman FMW:[id number] DOB: [birth year] DOA: 03/30/2024     2 DOS: the patient was seen and examined on 04/01/2024    Brief hospital course: Todd Chapman is a 79 y.o. male with PMH of osteoarthritis, bradycardia, sick sinus syndrome, pacemaker placement, influenza A, hepatitis A, constipation, GERD, diverticulosis, hyperlipidemia, history of multifocal pneumonia, small bowel obstruction, prostate cancer, polypharmacy, rosacea who was brought via EMS from home after waking up with sudden, severe RLE pain associated with spasms after being found on the floor by his wife.  The patient does not have any recollection of what happened. He denies falling.   Right hip x-ray showing comminuted intertrochanteric right hip fracture. Portable CXR showed mild diffuse interstitial opacities, lower lobe predominant, raising concern for multifocal infection/pneumonia.   Assessment and Plan:  Closed fracture of right hip, initial encounter [facility]) Admission x-ray revealed comminuted intertrochanteric right hip fracture Orthopedic surgery consulted. Patient underwent intramedullary fixation of the right femur on 1/22. Seen by PT. SNF recommended.  - Touch down weight bearing.  - Continue pain management.  - DVT ppx with Eliquis  2.5 mg BID for 72 hours and then resume baseline Eliquis .  - Hold alendronate for 6-12 months.   Hypoxia, resolved.  Due to pneumonia - Continue antibiotics. - Will continue to monitor off oxygen.   Pneumonia Patient presented with hypoxia. Chest x-ray shows infiltrates concerning for pneumonia. Aspiration seems likely in the setting of alcohol  use. - Continue antibiotics.  Anemia Likely acute blood loss anemia due to fracture and surgery.  - will monitor Hgb  - May need to delay return to full dose anticoagulation.    Constipation MiraLAX  and stool softeners.   Persistent atrial fibrillation (HCC) Low-dose apixaban  for now and then  resume home dose per orthopedics.    Hyperlipidemia Continue rosuvastatin  10 mg p.o. daily.    Gastro-esophageal reflux disease without esophagitis Continue pantoprazole  40 mg p.o. daily.     Sacroiliitis Already on analgesics.  Alcohol  use disorder Patient presented with blood alcohol  level of 103 mg/dL This would suggest that he had at least 4-6 drinks prior to his presentation. -Encourage alcohol  cessation. - Will watch for signs of withdrawal.     Subjective: Patient has no new complaints today. Weaned off oxygen.    Physical Exam: BP (!) 175/83 (BP Location: Left Arm)   Pulse 84   Temp 99.1 F (37.3 C) (Oral)   Resp 19   Ht 5' 11 (1.803 m)   Wt 98.4 kg   SpO2 98%   BMI 30.26 kg/m     General: Alert, oriented X3  Eyes: Pupils equal, reactive  Oral cavity: moist mucous membranes  Head: Atraumatic, normocephalic  Neck: supple  Chest: clear to auscultation. No crackles, no wheezes  CVS: S1,S2 RRR. No murmurs  Abd: No distention, soft, non-tender. No masses palpable  Extr: No edema   MSK: No joint deformities or swelling  Neurological: Grossly intact.    Data Reviewed:    Latest Ref Rng & Units 04/01/2024    3:15 AM 03/30/2024    2:44 AM 08/23/2022   12:38 PM  CBC  WBC 4.0 - 10.5 K/uL 7.0  5.3  9.6   Hemoglobin 13.0 - 17.0 g/dL 7.2  89.7  88.3   Hematocrit 39.0 - 52.0 % 23.4  31.7  34.1   Platelets 150 - 400 K/uL 152  193  152       Latest Ref Rng & Units 04/01/2024  3:15 AM 03/30/2024    2:44 AM 08/23/2022    1:01 PM  BMP  Glucose 70 - 99 mg/dL 873  872  883   BUN 8 - 23 mg/dL 18  15  17    Creatinine 0.61 - 1.24 mg/dL 9.33  9.16  9.25   Sodium 135 - 145 mmol/L 130  135  131   Potassium 3.5 - 5.1 mmol/L 4.8  4.0  4.3   Chloride 98 - 111 mmol/L 99  100  97   CO2 22 - 32 mmol/L 24  24  28    Calcium  8.9 - 10.3 mg/dL 9.4  9.9  89.4      Family Communication: n/a  Disposition: Status is: Inpatient DVT ppx:  Eliquis .      Author: MDALA-GAUSI, Todd Hammer AGATHA, Todd Chapman 04/01/2024 1:40 PM  For on call review [email or web address].    "

## 2024-04-01 NOTE — Progress Notes (Signed)
 Physical Therapy Treatment Patient Details Name: Todd Chapman MRN: [id number] DOB: 1945/04/09 Today's Date: 04/01/2024   History of Present Illness 79 y.o. male  who was brought via EMS from home after waking up with sudden, severe RLE pain associated with spasms after being found on the floor by his wife. Dx of comminuted IT hip fx, s/p IM fixation 03/30/24. with medical history significant of osteoarthritis, bradycardia, sick sinus syndrome, pacemaker placement, influenza A, hepatitis A, constipation, GERD, diverticulosis, hyperlipidemia, history of multifocal pneumonia, small bowel obstruction, prostate cancer, polypharmacy, rosacea.    PT Comments  Pt making slow gains, requires incr time, multi-modal cues and +2 assist throughout session. Reviewed TDWB, meaning, reasoning and provided examples of how to maintain TDWB, reviewed rolling is no contraindicated after IM nail/TDWB.  Pt was able to progress to standing and pivot to chair with +2 max/total assist this session. D/c plan for SNF remains appropriate. Continue PT in acute setting   If plan is discharge home, recommend the following: Two people to help with walking and/or transfers;A lot of help with bathing/dressing/bathroom;Assistance with cooking/housework;Assist for transportation;Help with stairs or ramp for entrance   Can travel by private vehicle     No  Equipment Recommendations  Rolling walker (2 wheels)    Recommendations for Other Services       Precautions / Restrictions Precautions Precautions: Fall Recall of Precautions/Restrictions: Intact Precaution/Restrictions Comments: pt reports he did not fall with this hip fx, it was due to poor bone quality Restrictions RLE Weight Bearing Per Provider Order: Touchdown weight bearing     Mobility  Bed Mobility Overal bed mobility: Needs Assistance Bed Mobility: Supine to Sit     Supine to sit: +2 for physical assistance, Max assist     General bed mobility  comments: multimodal cues for sequence, self assist. assist for RLE and to laterally scoot/position trunk prior to trunk elevation. incr time needed    Transfers Overall transfer level: Needs assistance Equipment used: Rolling walker (2 wheels) Transfers: Sit to/from Stand, Bed to chair/wheelchair/BSC Sit to Stand: From elevated surface, +2 physical assistance, +2 safety/equipment, Max assist, Total assist Stand pivot transfers: Max assist, +2 physical assistance, +2 safety/equipment         General transfer comment: 2 attempts to come to stand. pt anxious regarding pain/mobilizing/falling. with second  attempt pt able to come to full stand with cues for use of UEs,  trunk and hip extension, assist to rise and maintain standing and pivot to recliner, excessive time needed and environment manipulated to allow safe transiton to chair    Ambulation/Gait                   Stairs             Wheelchair Mobility     Tilt Bed    Modified Rankin (Stroke Patients Only)       Balance                                            Communication Communication Communication: No apparent difficulties  Cognition Arousal: Alert Behavior During Therapy: Anxious   PT - Cognitive impairments: No apparent impairments                       PT - Cognition Comments: pt requires frequent redirection to task, perseverates on pain,  pain meds etc. provided education on normalcy of pain with mobility given pt's surgery (even with pain meds) Following commands: Intact      Cueing Cueing Techniques: Verbal cues, Gestural cues, Tactile cues  Exercises General Exercises - Lower Extremity Ankle Circles/Pumps: AROM, Both, 10 reps, Supine Heel Slides: Limitations Heel Slides Limitations: fatigue and pain, encouraged pt to do on his own during day    General Comments        Pertinent Vitals/Pain Pain Assessment Pain Assessment: 0-10 Pain Score: 9  Pain  Location: R hip Pain Descriptors / Indicators: Sore, Grimacing, Guarding Pain Intervention(s): Limited activity within patient's tolerance, Monitored during session, Premedicated before session, Repositioned, Ice applied, Utilized relaxation techniques    Home Living                          Prior Function            PT Goals (current goals can now be found in the care plan section) Acute Rehab PT Goals 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, return to mediation work PT Goal Formulation: With patient/family Time For Goal Achievement: 04/14/24 Potential to Achieve Goals: Good Progress towards PT goals: Progressing toward goals    Frequency    Min 3X/week      PT Plan      Co-evaluation              AM-PAC PT 6 Clicks Mobility   Outcome Measure  Help needed turning from your back to your side while in a flat bed without using bedrails?: Total Help needed moving from lying on your back to sitting on the side of a flat bed without using bedrails?: Total Help needed moving to and from a bed to a chair (including a wheelchair)?: Total Help needed standing up from a chair using your arms (e.g., wheelchair or bedside chair)?: Total Help needed to walk in hospital room?: Total Help needed climbing 3-5 steps with a railing? : Total 6 Click Score: 6    End of Session Equipment Utilized During Treatment: Gait belt Activity Tolerance: Patient limited by pain;Patient limited by fatigue;Other (comment) (anxiety) Patient left: in chair;with call bell/phone within reach;with chair alarm set Nurse Communication: Mobility status PT Visit Diagnosis: Difficulty in walking, not elsewhere classified (R26.2);Pain Pain - Right/Left: Right Pain - part of body: Hip     Time: 8947-8868 PT Time Calculation (min) (ACUTE ONLY): 39 min  Charges:    $Therapeutic Activity: 38-52 mins PT General Charges $$ ACUTE PT VISIT: 1 Visit                     Brain Honeycutt, PT  Acute Rehab Dept  [facility]) [phone]  04/01/2024    [facility] 04/01/2024, 12:22 PM

## 2024-04-01 NOTE — Plan of Care (Signed)
" °  Problem: Education: Goal: Knowledge of the prescribed therapeutic regimen will improve Outcome: Progressing   Problem: Bowel/Gastric: Goal: Gastrointestinal status for postoperative course will improve Outcome: Progressing   Problem: Cardiac: Goal: Ability to maintain an adequate cardiac output Outcome: Progressing   Problem: Nutritional: Goal: Will attain and maintain optimal nutritional status Outcome: Progressing   Problem: Neurological: Goal: Will regain or maintain usual level of consciousness Outcome: Progressing   Problem: Clinical Measurements: Goal: Ability to maintain clinical measurements within normal limits Outcome: Progressing   Problem: Respiratory: Goal: Will regain and/or maintain adequate ventilation Outcome: Progressing   Problem: Urinary Elimination: Goal: Will remain free from infection Outcome: Progressing   "

## 2024-04-01 NOTE — Progress Notes (Signed)
 Orthopedics Progress Note  Subjective: Patient states that he has been working with the therapist and has made progress. Pain controlled  Objective:  Vitals:   03/31/24 2150 04/01/24 0530  BP: (!) 144/79 (!) 165/107  Pulse: 78 85  Resp: 18 19  Temp: 99.7 F (37.6 C) 99.1 F (37.3 C)  SpO2: 97% 98%    General: Awake and alert  Musculoskeletal: Right hip and thigh swollen. Dressings intact. No pain with ankle ROM. Neg Homans Neurovascularly intact  Lab Results  Component Value Date   WBC 7.0 04/01/2024   HGB 7.2 (L) 04/01/2024   HCT 23.4 (L) 04/01/2024   MCV 95.9 04/01/2024   PLT 152 04/01/2024       Component Value Date/Time   NA 130 (L) 04/01/2024 0315   NA 141 02/08/2017 1417   K 4.8 04/01/2024 0315   CL 99 04/01/2024 0315   CO2 24 04/01/2024 0315   GLUCOSE 126 (H) 04/01/2024 0315   BUN 18 04/01/2024 0315   BUN 14 02/08/2017 1417   CREATININE 0.66 04/01/2024 0315   CALCIUM  9.4 04/01/2024 0315   CALCIUM  9.1 02/12/2021 0213   GFRNONAA >60 04/01/2024 0315   GFRAA >60 02/17/2017 0517    Lab Results  Component Value Date   INR 1.1 03/30/2024   INR 1.0 02/06/2021   INR 1.3 (H) 04/09/2020    Assessment/Plan: S/p INSERTION, INTRAMEDULLARY ROD, FEMUR Stable from ortho standpoint. Continue DVT prophylaxis with eliquis  and mobilization D/C pending to SNF  Elspeth R. Kay, MD 04/01/2024 8:34 AM

## 2024-04-02 DIAGNOSIS — S72001A Fracture of unspecified part of neck of right femur, initial encounter for closed fracture: Secondary | ICD-10-CM | POA: Diagnosis not present

## 2024-04-02 LAB — BASIC METABOLIC PANEL WITH GFR
Anion gap: 7 (ref 5–15)
BUN: 14 mg/dL (ref 8–23)
CO2: 24 mmol/L (ref 22–32)
Calcium: 9.4 mg/dL (ref 8.9–10.3)
Chloride: 99 mmol/L (ref 98–111)
Creatinine, Ser: 0.62 mg/dL (ref 0.61–1.24)
GFR, Estimated: >60 mL/min
Glucose, Bld: 123 mg/dL — ABNORMAL HIGH (ref 70–99)
Potassium: 4.2 mmol/L (ref 3.5–5.1)
Sodium: 130 mmol/L — ABNORMAL LOW (ref 135–145)

## 2024-04-02 LAB — CBC
HCT: 21.7 % — ABNORMAL LOW (ref 39.0–52.0)
Hemoglobin: 7 g/dL — ABNORMAL LOW (ref 13.0–17.0)
MCH: 30 pg (ref 26.0–34.0)
MCHC: 32.3 g/dL (ref 30.0–36.0)
MCV: 93.1 fL (ref 80.0–100.0)
Platelets: 144 10*3/uL — ABNORMAL LOW (ref 150–400)
RBC: 2.33 MIL/uL — ABNORMAL LOW (ref 4.22–5.81)
RDW: 14.1 % (ref 11.5–15.5)
WBC: 5.6 10*3/uL (ref 4.0–10.5)
nRBC: 0 % (ref 0.0–0.2)

## 2024-04-02 MED ORDER — APIXABAN 2.5 MG PO TABS
2.5000 mg | ORAL_TABLET | Freq: Two times a day (BID) | ORAL | Status: DC
  Administered 2024-04-02 – 2024-04-04 (×4): 2.5 mg via ORAL
  Filled 2024-04-02 (×4): qty 1

## 2024-04-02 NOTE — Progress Notes (Addendum)
 " Progress Note   Patient: Todd Chapman FMW:[id number] DOB: 24-Nov-1945 DOA: 03/30/2024     3 DOS: the patient was seen and examined on 04/02/2024    Brief hospital course: Todd Chapman is a 79 y.o. male with PMH of osteoarthritis, bradycardia, sick sinus syndrome, pacemaker placement, influenza A, hepatitis A, constipation, GERD, diverticulosis, hyperlipidemia, history of multifocal pneumonia, small bowel obstruction, prostate cancer, polypharmacy, rosacea who was brought via EMS from home after waking up with sudden, severe RLE pain associated with spasms after being found on the floor by his wife.  The patient does not have any recollection of what happened. He denies falling.   Right hip x-ray showing comminuted intertrochanteric right hip fracture. Portable CXR showed mild diffuse interstitial opacities, lower lobe predominant, raising concern for multifocal infection/pneumonia.  Patient s/p intramedullary fixation of the right femur on 1/22.  Assessment and Plan:  Closed fracture of right hip, initial encounter [facility]) Admission x-ray revealed comminuted intertrochanteric right hip fracture Orthopedic surgery consulted. Patient underwent intramedullary fixation of the right femur on 1/22. Seen by PT. SNF recommended.  - Touch down weight bearing.  - Continue pain management.  - DVT ppx with Eliquis  2.5 mg BID for 72 hours and then resume baseline Eliquis  per Ortho.  Will extend duration of 2.5 mg twice daily until hemoglobin stabilizes. - Hold alendronate for 6-12 months.   Hypoxia, resolved.  Due to pneumonia - Continue antibiotics. - Will continue to monitor off oxygen.   Pneumonia Patient presented with hypoxia. Chest x-ray shows infiltrates concerning for pneumonia. Aspiration seems likely in the setting of alcohol  use. - Continue antibiotics.  Hyponatremia Appears chronic as sodium baseline seems to be 130 to 135. - Will continue to monitor.   Anemia Likely acute  blood loss anemia due to fracture and surgery.  Hemoglobin 7.0 on 1/25. Discussed transfusion with patient and wife. Patient opts to monitor for hemoglobin tomorrow to determine whether he will be transfused. - will monitor Hgb  - Will delay return to full dose anticoagulation.    Constipation MiraLAX  and stool softeners.   Persistent atrial fibrillation (HCC) Low-dose apixaban  for now and then resume home dose per orthopedics.    Hyperlipidemia Continue rosuvastatin  10 mg p.o. daily.    Gastro-esophageal reflux disease without esophagitis Continue pantoprazole  40 mg p.o. daily.     Sacroiliitis Already on analgesics.  Alcohol  use disorder Patient presented with blood alcohol  level of 103 mg/dL This would suggest that he had at least 4-6 drinks prior to his presentation. -Encourage alcohol  cessation. - Will watch for signs of withdrawal.     Subjective: Patient has no new complaints today.  Remains off oxygen.  Denies lightheadedness, weakness.   Physical Exam: BP (!) 140/75 (BP Location: Left Arm)   Pulse 88   Temp 99.4 F (37.4 C) (Oral)   Resp 16   Ht 5' 11 (1.803 m)   Wt 98.4 kg   SpO2 91%   BMI 30.26 kg/m     General: Alert, oriented X3  Eyes: Pupils equal, reactive  Oral cavity: moist mucous membranes  Head: Atraumatic, normocephalic  Neck: supple  Chest: clear to auscultation. No crackles, no wheezes  CVS: S1,S2 RRR. No murmurs  Abd: No distention, soft, non-tender. No masses palpable  Extr: No edema   MSK: No joint deformities or swelling.  Dressing in place right flank.  No increased swelling or induration. Neurological: Grossly intact.    Data Reviewed:    Latest Ref Rng &  Units 04/02/2024    3:37 AM 04/01/2024    3:15 AM 03/30/2024    2:44 AM  CBC  WBC 4.0 - 10.5 K/uL 5.6  7.0  5.3   Hemoglobin 13.0 - 17.0 g/dL 7.0  7.2  89.7   Hematocrit 39.0 - 52.0 % 21.7  23.4  31.7   Platelets 150 - 400 K/uL 144  152  193       Latest Ref Rng &  Units 04/02/2024    3:37 AM 04/01/2024    3:15 AM 03/30/2024    2:44 AM  BMP  Glucose 70 - 99 mg/dL 876  873  872   BUN 8 - 23 mg/dL 14  18  15    Creatinine 0.61 - 1.24 mg/dL 9.37  9.33  9.16   Sodium 135 - 145 mmol/L 130  130  135   Potassium 3.5 - 5.1 mmol/L 4.2  4.8  4.0   Chloride 98 - 111 mmol/L 99  99  100   CO2 22 - 32 mmol/L 24  24  24    Calcium  8.9 - 10.3 mg/dL 9.4  9.4  9.9      Family Communication: n/a  Disposition: Status is: Inpatient DVT ppx: Eliquis .      Author: MDALA-GAUSI, Tevis Conger AGATHA, MD 04/02/2024 1:46 PM  For on call review [email or web address].    "

## 2024-04-02 NOTE — Plan of Care (Signed)
" °  Problem: Education: Goal: Knowledge of General Education information will improve Description: Including pain rating scale, medication(s)/side effects and non-pharmacologic comfort measures Outcome: Progressing   Problem: Health Behavior/Discharge Planning: Goal: Ability to manage health-related needs will improve Outcome: Progressing   Problem: Clinical Measurements: Goal: Ability to maintain clinical measurements within normal limits will improve Outcome: Progressing Goal: Will remain free from infection Outcome: Progressing Goal: Diagnostic test results will improve Outcome: Progressing Goal: Respiratory complications will improve Outcome: Progressing Goal: Cardiovascular complication will be avoided Outcome: Progressing   Problem: Activity: Goal: Risk for activity intolerance will decrease Outcome: Progressing   Problem: Nutrition: Goal: Adequate nutrition will be maintained Outcome: Progressing   Problem: Coping: Goal: Level of anxiety will decrease Outcome: Progressing   Problem: Elimination: Goal: Will not experience complications related to bowel motility Outcome: Progressing Goal: Will not experience complications related to urinary retention Outcome: Completed/Met   Problem: Pain Managment: Goal: General experience of comfort will improve and/or be controlled Outcome: Progressing   Problem: Safety: Goal: Ability to remain free from injury will improve Outcome: Progressing   Problem: Skin Integrity: Goal: Risk for impaired skin integrity will decrease Outcome: Progressing   Problem: Education: Goal: Knowledge of the prescribed therapeutic regimen will improve Outcome: Progressing   Problem: Bowel/Gastric: Goal: Gastrointestinal status for postoperative course will improve Outcome: Progressing   Problem: Cardiac: Goal: Ability to maintain an adequate cardiac output Outcome: Progressing Goal: Will show no evidence of cardiac arrhythmias Outcome:  Progressing   Problem: Nutritional: Goal: Will attain and maintain optimal nutritional status Outcome: Progressing   Problem: Neurological: Goal: Will regain or maintain usual level of consciousness Outcome: Progressing   Problem: Clinical Measurements: Goal: Ability to maintain clinical measurements within normal limits Outcome: Progressing Goal: Postoperative complications will be avoided or minimized Outcome: Progressing   Problem: Respiratory: Goal: Will regain and/or maintain adequate ventilation Outcome: Progressing Goal: Respiratory status will improve Outcome: Progressing   Problem: Skin Integrity: Goal: Demonstrates signs of wound healing without infection Outcome: Progressing   Problem: Urinary Elimination: Goal: Will remain free from infection Outcome: Progressing Goal: Ability to achieve and maintain adequate urine output Outcome: Completed/Met   Problem: Activity: Goal: Ability to tolerate increased activity will improve Outcome: Progressing   Problem: Clinical Measurements: Goal: Ability to maintain a body temperature in the normal range will improve Outcome: Adequate for Discharge   Problem: Respiratory: Goal: Ability to maintain adequate ventilation will improve Outcome: Adequate for Discharge Goal: Ability to maintain a clear airway will improve Outcome: Adequate for Discharge   Problem: Education: Goal: Verbalization of understanding the information provided (i.e., activity precautions, restrictions, etc) will improve Outcome: Progressing Goal: Individualized Educational Video(s) Outcome: Progressing   Problem: Activity: Goal: Ability to ambulate and perform ADLs will improve Outcome: Progressing   Problem: Clinical Measurements: Goal: Postoperative complications will be avoided or minimized Outcome: Progressing   Problem: Self-Concept: Goal: Ability to maintain and perform role responsibilities to the fullest extent possible will  improve Outcome: Progressing   Problem: Pain Management: Goal: Pain level will decrease Outcome: Progressing   "

## 2024-04-03 DIAGNOSIS — S72001A Fracture of unspecified part of neck of right femur, initial encounter for closed fracture: Secondary | ICD-10-CM | POA: Diagnosis not present

## 2024-04-03 LAB — CBC
HCT: 20.9 % — ABNORMAL LOW (ref 39.0–52.0)
Hemoglobin: 6.9 g/dL — CL (ref 13.0–17.0)
MCH: 30.5 pg (ref 26.0–34.0)
MCHC: 33 g/dL (ref 30.0–36.0)
MCV: 92.5 fL (ref 80.0–100.0)
Platelets: 168 10*3/uL (ref 150–400)
RBC: 2.26 MIL/uL — ABNORMAL LOW (ref 4.22–5.81)
RDW: 14.3 % (ref 11.5–15.5)
WBC: 6.1 10*3/uL (ref 4.0–10.5)
nRBC: 0 % (ref 0.0–0.2)

## 2024-04-03 LAB — BASIC METABOLIC PANEL WITH GFR
Anion gap: 8 (ref 5–15)
BUN: 16 mg/dL (ref 8–23)
CO2: 22 mmol/L (ref 22–32)
Calcium: 9.8 mg/dL (ref 8.9–10.3)
Chloride: 96 mmol/L — ABNORMAL LOW (ref 98–111)
Creatinine, Ser: 0.66 mg/dL (ref 0.61–1.24)
GFR, Estimated: >60 mL/min
Glucose, Bld: 124 mg/dL — ABNORMAL HIGH (ref 70–99)
Potassium: 4.4 mmol/L (ref 3.5–5.1)
Sodium: 126 mmol/L — ABNORMAL LOW (ref 135–145)

## 2024-04-03 LAB — HEMOGLOBIN AND HEMATOCRIT, BLOOD
HCT: 26.1 % — ABNORMAL LOW (ref 39.0–52.0)
Hemoglobin: 8.9 g/dL — ABNORMAL LOW (ref 13.0–17.0)

## 2024-04-03 LAB — TSH: TSH: 2.34 u[IU]/mL (ref 0.350–4.500)

## 2024-04-03 LAB — CORTISOL: Cortisol, Plasma: 26.7 ug/dL

## 2024-04-03 LAB — OSMOLALITY, URINE: Osmolality, Ur: 367 mosm/kg (ref 300–900)

## 2024-04-03 LAB — SODIUM, URINE, RANDOM: Sodium, Ur: <30 mmol/L

## 2024-04-03 LAB — OSMOLALITY: Osmolality: 273 mosm/kg — ABNORMAL LOW (ref 275–295)

## 2024-04-03 LAB — CREATININE, URINE, RANDOM: Creatinine, Urine: 67 mg/dL

## 2024-04-03 LAB — PREPARE RBC (CROSSMATCH)

## 2024-04-03 MED ORDER — FUROSEMIDE 10 MG/ML IJ SOLN
20.0000 mg | Freq: Once | INTRAMUSCULAR | Status: AC
  Administered 2024-04-03: 20 mg via INTRAVENOUS
  Filled 2024-04-03: qty 2

## 2024-04-03 MED ORDER — LORAZEPAM 1 MG PO TABS: 0.0000 mg | ORAL_TABLET | Freq: Four times a day (QID) | ORAL | Status: DC

## 2024-04-03 MED ORDER — THIAMINE MONONITRATE 100 MG PO TABS
100.0000 mg | ORAL_TABLET | Freq: Every day | ORAL | Status: DC
  Administered 2024-04-03 – 2024-04-04 (×2): 100 mg via ORAL
  Filled 2024-04-03 (×2): qty 1

## 2024-04-03 MED ORDER — SODIUM CHLORIDE 0.9% IV SOLUTION: Freq: Once | INTRAVENOUS | Status: AC

## 2024-04-03 MED ORDER — LORAZEPAM 1 MG PO TABS: 0.0000 mg | ORAL_TABLET | Freq: Two times a day (BID) | ORAL | Status: DC

## 2024-04-03 MED ORDER — LORAZEPAM 2 MG/ML IJ SOLN: 1.0000 mg | INTRAMUSCULAR | Status: DC | PRN

## 2024-04-03 MED ORDER — LORAZEPAM 1 MG PO TABS: 1.0000 mg | ORAL_TABLET | ORAL | Status: DC | PRN

## 2024-04-03 MED ORDER — THIAMINE HCL 100 MG/ML IJ SOLN: 100.0000 mg | Freq: Every day | INTRAMUSCULAR | Status: DC

## 2024-04-03 NOTE — TOC Progression Note (Signed)
 Transition of Care [facility]) - Progression Note    Patient Details  Name: Todd Chapman MRN: [id number] Date of Birth: November 15, 1945  Transition of Care [facility]) CM/SW Contact  Heather DELENA Saltness, LCSW Phone Number: 04/03/2024, 10:39 AM  Clinical Narrative:     ADDENDUM  1439 - CSW attempted to call pt's spouse, Kadence Mimbs [phone], via phone call to advise of [facility] and [facility] have offered a bed. No answer, voicemail left requesting return phone call.  CSW spoke with pt's spouse, Neshawn Aird [phone] via phone call to discuss SNF bed availability and obtain preferred facility. CSW provided pt with list of facilities with available beds including name of facility, location, and Medicare Star-Rating. Pt's spouse states she needs time to review facilities before making final decision. TOC will continue to follow.  Medicare Star-Ratings  [facility] and [facility] [address] [place], [place] [address] [phone] Overall rating ??? Much below average  [facility] [address] [place], [place] [address] [phone] Overall rating ?? Below average  [facility] and [facility] [address] [place], [place] [address] [phone] Overall rating ?? Much below average  [facility] [address] [place], [place] [address] [phone] Overall rating ? Much below average  [facility] [address] [place], [place] [address] [phone] Overall rating ??? Much above average  [facility] [address] [place], [place] [address] [phone] Overall rating ?????  [facility] and [facility] [address] [place], [place] [address] [phone] Overall rating ?? Much above average  [facility] and [facility] [address] [place], [place]  [address] [phone] Overall rating ? Much below average  [facility] [address] [place], [place] [address] [phone] Overall rating? Below average  [facility] [address] [place], [place] [address] [phone] Overall rating ??? Average  [facility] [address] [place], [place] [address] [phone] Overall rating ???? Above average  Expected Discharge Plan: Skilled Nursing Facility Barriers to Discharge: Continued Medical Work up    Expected Discharge Plan and Services   Discharge Planning Services: CM Consult   Living arrangements for the past 2 months: Single Family Home                 DME Arranged: N/A DME Agency: NA       HH Arranged: NA HH Agency: NA         Social Drivers of Health (SDOH) Interventions SDOH Screenings   Food Insecurity: No Food Insecurity (03/31/2024)  Housing: Low Risk (03/31/2024)  Transportation Needs: No Transportation Needs (03/31/2024)  Utilities: Not At Risk (03/31/2024)  Depression (PHQ2-9): Low Risk (12/23/2022)  Social Connections: Moderately Integrated (03/30/2024)  Tobacco Use: Medium Risk (03/30/2024)    Readmission Risk Interventions    03/31/2024    2:11 PM  Readmission Risk Prevention Plan  Transportation Screening Complete  PCP or Specialist Appt within 5-7 Days Complete  Home Care Screening Complete  Medication Review (RN CM) Complete   Signed: Heather Saltness, MSW, LCSW Clinical Social Worker Inpatient Care Management 04/03/2024 5:44 PM

## 2024-04-03 NOTE — Progress Notes (Signed)
 Lavanda Horns, NP, notified of Hgb: 6.9.   No new orders given at this time.

## 2024-04-03 NOTE — Progress Notes (Signed)
 " Progress Note   Patient: Todd Chapman DOB: 10-Nov-1945 DOA: 03/30/2024     4 DOS: the patient was seen and examined on 04/03/2024    Brief hospital course: Todd Chapman is a 79 y.o. male with PMH of osteoarthritis, bradycardia, sick sinus syndrome, pacemaker placement, influenza A, hepatitis A, constipation, GERD, diverticulosis, hyperlipidemia, history of multifocal pneumonia, small bowel obstruction, prostate cancer, polypharmacy, rosacea who was brought via EMS from home after waking up with sudden, severe RLE pain associated with spasms after being found on the floor by his wife.  The patient does not have any recollection of what happened. He denies falling.   Right hip x-ray showing comminuted intertrochanteric right hip fracture. Portable CXR showed mild diffuse interstitial opacities, lower lobe predominant, raising concern for multifocal infection/pneumonia.  Patient s/p intramedullary fixation of the right femur on 1/22.  Assessment and Plan:  Closed fracture of right hip, initial encounter [facility]) Admission x-ray revealed comminuted intertrochanteric right hip fracture Orthopedic surgery consulted. Patient underwent intramedullary fixation of the right femur on 1/22. Seen by PT. SNF recommended.  - Touch down weight bearing.  - Continue pain management.  - DVT ppx with Eliquis  2.5 mg BID for 72 hours and then resume baseline Eliquis  per Ortho.  Will extend duration of 2.5 mg twice daily until hemoglobin stabilizes. - Hold alendronate for 6-12 months.   Anemia Likely acute blood loss anemia due to fracture and surgery.  Hemoglobin 7.0 on 1/25, and 6.9 on 1/26. -Will transfuse. - Will delay return to full dose anticoagulation.   Acute on chronic hyponatremia Sodium baseline seems to be 130 to 135. Sodium is 130 for past few days. Trended down to 126 on 1/26. Patient admits to increased fluid intake and does appear mildly hypervolemic. - Restrict fluids. -  Lasix  20 mg IV x 1. -Urine studies.  Truncal rash Patient with history of itchy truncal rash in cold weather. Follows up with dermatology. - Skin lubrication for now. - Outpatient follow-up with dermatology.  Hypoxia, resolved.  Due to pneumonia - Continue antibiotics. - Will continue to monitor off oxygen.   Pneumonia Patient presented with hypoxia. Chest x-ray shows infiltrates concerning for pneumonia. Aspiration seems likely in the setting of alcohol  use. - Continue antibiotics.   Constipation MiraLAX  and stool softeners.   Persistent atrial fibrillation (HCC) Low-dose apixaban  for now and then resume home dose per orthopedics -awaiting stability of hemoglobin.    Hyperlipidemia Continue rosuvastatin  10 mg p.o. daily.    Gastro-esophageal reflux disease without esophagitis Continue pantoprazole  40 mg p.o. daily.     Sacroiliitis Already on analgesics.  Alcohol  use disorder Patient presented with blood alcohol  level of 103 mg/dL This would suggest that he had at least 4-6 drinks prior to his presentation. Patient states he generally drinks just 1 glass of wine a day although he sometimes drinks more. -Encourage alcohol  cessation. - Will watch for signs of withdrawal.     Subjective: Patient states he has an itchy rash on his trunk.  He has had this in the past and has been treated with steroids by his dermatologist. He has not had a bowel movement in several days.   Physical Exam: BP 138/86   Pulse 67   Temp 98.6 F (37 C) (Oral)   Resp 16   Ht 5' 11 (1.803 m)   Wt 98.4 kg   SpO2 95%   BMI 30.26 kg/m     General: Alert, oriented X3  Eyes: Pupils equal,  reactive  Oral cavity: moist mucous membranes  Head: Atraumatic, normocephalic  Neck: supple  Chest: Scattered crackles CVS: S1,S2 RRR. No murmurs  Abd: No distention, soft, non-tender. No masses palpable  Extr: Trace pedal edema MSK: No joint deformities or swelling.  Dressing in place right  flank.  No increased swelling or induration. Neurological: Grossly intact.   Skin: Scattered petechial rash on trunk.   Data Reviewed:    Latest Ref Rng & Units 04/03/2024    3:21 AM 04/02/2024    3:37 AM 04/01/2024    3:15 AM  CBC  WBC 4.0 - 10.5 K/uL 6.1  5.6  7.0   Hemoglobin 13.0 - 17.0 g/dL 6.9  7.0  7.2   Hematocrit 39.0 - 52.0 % 20.9  21.7  23.4   Platelets 150 - 400 K/uL 168  144  152       Latest Ref Rng & Units 04/03/2024    3:21 AM 04/02/2024    3:37 AM 04/01/2024    3:15 AM  BMP  Glucose 70 - 99 mg/dL 875  876  873   BUN 8 - 23 mg/dL 16  14  18    Creatinine 0.61 - 1.24 mg/dL 9.33  9.37  9.33   Sodium 135 - 145 mmol/L 126  130  130   Potassium 3.5 - 5.1 mmol/L 4.4  4.2  4.8   Chloride 98 - 111 mmol/L 96  99  99   CO2 22 - 32 mmol/L 22  24  24    Calcium  8.9 - 10.3 mg/dL 9.8  9.4  9.4      Family Communication: Spoke with wife at bedside  Disposition: Status is: Inpatient DVT ppx: Eliquis .      Author: MDALA-GAUSI, Todd Sotero AGATHA, MD 04/03/2024 12:25 PM  For on call review [email or web address]    "

## 2024-04-03 NOTE — Progress Notes (Signed)
 New orders added. Please refer to MD orders.

## 2024-04-03 NOTE — Plan of Care (Signed)
" °  Problem: Education: Goal: Knowledge of General Education information will improve Description: Including pain rating scale, medication(s)/side effects and non-pharmacologic comfort measures Outcome: Progressing   Problem: Health Behavior/Discharge Planning: Goal: Ability to manage health-related needs will improve Outcome: Progressing   Problem: Clinical Measurements: Goal: Ability to maintain clinical measurements within normal limits will improve Outcome: Progressing Goal: Will remain free from infection Outcome: Progressing Goal: Diagnostic test results will improve Outcome: Progressing Goal: Respiratory complications will improve Outcome: Progressing Goal: Cardiovascular complication will be avoided Outcome: Progressing   Problem: Activity: Goal: Risk for activity intolerance will decrease Outcome: Progressing   Problem: Nutrition: Goal: Adequate nutrition will be maintained Outcome: Completed/Met   Problem: Coping: Goal: Level of anxiety will decrease Outcome: Progressing   Problem: Pain Managment: Goal: General experience of comfort will improve and/or be controlled Outcome: Progressing   Problem: Safety: Goal: Ability to remain free from injury will improve Outcome: Progressing   Problem: Skin Integrity: Goal: Risk for impaired skin integrity will decrease Outcome: Progressing   Problem: Education: Goal: Knowledge of the prescribed therapeutic regimen will improve Outcome: Progressing   Problem: Bowel/Gastric: Goal: Gastrointestinal status for postoperative course will improve Outcome: Progressing   Problem: Cardiac: Goal: Ability to maintain an adequate cardiac output Outcome: Adequate for Discharge Goal: Will show no evidence of cardiac arrhythmias Outcome: Progressing   Problem: Nutritional: Goal: Will attain and maintain optimal nutritional status Outcome: Completed/Met   Problem: Neurological: Goal: Will regain or maintain usual level of  consciousness Outcome: Completed/Met   Problem: Clinical Measurements: Goal: Ability to maintain clinical measurements within normal limits Outcome: Progressing Goal: Postoperative complications will be avoided or minimized Outcome: Progressing   Problem: Respiratory: Goal: Will regain and/or maintain adequate ventilation Outcome: Completed/Met Goal: Respiratory status will improve Outcome: Completed/Met   Problem: Skin Integrity: Goal: Demonstrates signs of wound healing without infection Outcome: Progressing   Problem: Urinary Elimination: Goal: Will remain free from infection Outcome: Progressing   Problem: Activity: Goal: Ability to tolerate increased activity will improve Outcome: Progressing   Problem: Clinical Measurements: Goal: Ability to maintain a body temperature in the normal range will improve Outcome: Completed/Met   Problem: Respiratory: Goal: Ability to maintain adequate ventilation will improve Outcome: Adequate for Discharge Goal: Ability to maintain a clear airway will improve Outcome: Adequate for Discharge   Problem: Education: Goal: Verbalization of understanding the information provided (i.e., activity precautions, restrictions, etc) will improve Outcome: Progressing Goal: Individualized Educational Video(s) Outcome: Completed/Met   Problem: Activity: Goal: Ability to ambulate and perform ADLs will improve Outcome: Progressing   Problem: Clinical Measurements: Goal: Postoperative complications will be avoided or minimized Outcome: Progressing   Problem: Self-Concept: Goal: Ability to maintain and perform role responsibilities to the fullest extent possible will improve Outcome: Progressing   Problem: Pain Management: Goal: Pain level will decrease Outcome: Progressing   "

## 2024-04-03 NOTE — Progress Notes (Signed)
 Date and time results received: 04/03/24 at 0355  Test: Hemoglobin Critical Value: 6.9  Name of Provider Notified: Primary RN Beryl notified  Orders Received? Or Actions Taken?: Primary RN to notify provider and await any new orders

## 2024-04-03 NOTE — Plan of Care (Incomplete)
  Problem: Activity: Goal: Risk for activity intolerance will decrease Outcome: Progressing   Problem: Safety: Goal: Ability to remain free from injury will improve Outcome: Progressing   Problem: Respiratory: Goal: Ability to maintain adequate ventilation will improve Outcome: Progressing   

## 2024-04-04 DIAGNOSIS — S72001A Fracture of unspecified part of neck of right femur, initial encounter for closed fracture: Secondary | ICD-10-CM | POA: Diagnosis not present

## 2024-04-04 LAB — TYPE AND SCREEN
ABO/RH(D): O POS
Antibody Screen: NEGATIVE
Unit division: 0

## 2024-04-04 LAB — BASIC METABOLIC PANEL WITH GFR
Anion gap: 9 (ref 5–15)
BUN: 14 mg/dL (ref 8–23)
CO2: 25 mmol/L (ref 22–32)
Calcium: 10.3 mg/dL (ref 8.9–10.3)
Chloride: 95 mmol/L — ABNORMAL LOW (ref 98–111)
Creatinine, Ser: 0.59 mg/dL — ABNORMAL LOW (ref 0.61–1.24)
GFR, Estimated: >60 mL/min
Glucose, Bld: 119 mg/dL — ABNORMAL HIGH (ref 70–99)
Potassium: 4.7 mmol/L (ref 3.5–5.1)
Sodium: 130 mmol/L — ABNORMAL LOW (ref 135–145)

## 2024-04-04 LAB — CBC
HCT: 25.2 % — ABNORMAL LOW (ref 39.0–52.0)
Hemoglobin: 8.3 g/dL — ABNORMAL LOW (ref 13.0–17.0)
MCH: 30.2 pg (ref 26.0–34.0)
MCHC: 32.9 g/dL (ref 30.0–36.0)
MCV: 91.6 fL (ref 80.0–100.0)
Platelets: 199 10*3/uL (ref 150–400)
RBC: 2.75 MIL/uL — ABNORMAL LOW (ref 4.22–5.81)
RDW: 14.3 % (ref 11.5–15.5)
WBC: 5.2 10*3/uL (ref 4.0–10.5)
nRBC: 0 % (ref 0.0–0.2)

## 2024-04-04 LAB — BPAM RBC
Blood Product Expiration Date: 202602162359
ISSUE DATE / TIME: 202601260743
Unit Type and Rh: 5100

## 2024-04-04 MED ORDER — APIXABAN 5 MG PO TABS: 5.0000 mg | ORAL_TABLET | Freq: Two times a day (BID) | ORAL | Status: AC

## 2024-04-04 MED ORDER — VITAMIN B-1 100 MG PO TABS: 100.0000 mg | ORAL_TABLET | Freq: Every day | ORAL | Status: AC

## 2024-04-04 MED ORDER — POLYETHYLENE GLYCOL 3350 17 G PO PACK: 17.0000 g | PACK | Freq: Every day | ORAL | Status: AC | PRN

## 2024-04-04 MED ORDER — DOCUSATE SODIUM 100 MG PO CAPS: 100.0000 mg | ORAL_CAPSULE | Freq: Two times a day (BID) | ORAL | Status: AC

## 2024-04-04 MED ORDER — FUROSEMIDE 10 MG/ML IJ SOLN
20.0000 mg | Freq: Once | INTRAMUSCULAR | Status: AC
  Administered 2024-04-04: 20 mg via INTRAVENOUS
  Filled 2024-04-04: qty 2

## 2024-04-04 MED ORDER — METHOCARBAMOL 500 MG PO TABS: 500.0000 mg | ORAL_TABLET | Freq: Four times a day (QID) | ORAL | Status: AC | PRN

## 2024-04-04 NOTE — TOC Transition Note (Signed)
 Transition of Care [facility]) - Discharge Note   Patient Details  Name: Todd Chapman MRN: [id number] Date of Birth: 09/11/1945  Transition [facility]) CM/SW Contact:  NORMAN ASPEN, LCSW Phone Number: 04/04/2024, 1:43 PM   Clinical Narrative:     Pt and wife have accepted SNF bed at [facility] and pt is medically cleared for dc today.  Pt and wife agreeable. Facility ready for admission.  PTAR called at 12:55pm.  RN to call report to [phone].  No further IP CM needs.  Final next level of care: Skilled Nursing Facility Barriers to Discharge: Barriers Resolved   Patient Goals and CMS Choice Patient states their goals for this hospitalization and ongoing recovery are:: short-term rehab CMS Medicare.gov Compare Post Acute Care list provided to:: Patient   [place] ownership interest in [facility].provided to:: Patient    Discharge Placement PASRR number recieved: 03/31/24            Patient chooses bed at: [facility] Patient to be transferred to facility by: PTAR Name of family member notified: spouse Patient and family notified of of transfer: 04/04/24  Discharge Plan and Services Additional resources added to the After Visit Summary for     Discharge Planning Services: CM Consult            DME Arranged: N/A DME Agency: NA       HH Arranged: NA HH Agency: NA        Social Drivers of Health (SDOH) Interventions SDOH Screenings   Food Insecurity: No Food Insecurity (03/31/2024)  Housing: Low Risk (03/31/2024)  Transportation Needs: No Transportation Needs (03/31/2024)  Utilities: Not At Risk (03/31/2024)  Depression (PHQ2-9): Low Risk (12/23/2022)  Social Connections: Moderately Integrated (03/30/2024)  Tobacco Use: Medium Risk (03/30/2024)     Readmission Risk Interventions    03/31/2024    2:11 PM  Readmission Risk Prevention Plan  Transportation Screening Complete  PCP or Specialist Appt within 5-7 Days Complete  Home Care Screening  Complete  Medication Review (RN CM) Complete

## 2024-04-04 NOTE — Plan of Care (Signed)
" °  Problem: Education: Goal: Knowledge of General Education information will improve Description: Including pain rating scale, medication(s)/side effects and non-pharmacologic comfort measures Outcome: Adequate for Discharge   Problem: Health Behavior/Discharge Planning: Goal: Ability to manage health-related needs will improve Outcome: Progressing   Problem: Clinical Measurements: Goal: Ability to maintain clinical measurements within normal limits will improve Outcome: Progressing Goal: Will remain free from infection Outcome: Progressing Goal: Diagnostic test results will improve Outcome: Progressing Goal: Respiratory complications will improve Outcome: Progressing Goal: Cardiovascular complication will be avoided Outcome: Progressing   Problem: Activity: Goal: Risk for activity intolerance will decrease Outcome: Progressing   Problem: Coping: Goal: Level of anxiety will decrease Outcome: Progressing   Problem: Elimination: Goal: Will not experience complications related to bowel motility Outcome: Progressing   Problem: Pain Managment: Goal: General experience of comfort will improve and/or be controlled Outcome: Progressing   Problem: Safety: Goal: Ability to remain free from injury will improve Outcome: Progressing   Problem: Skin Integrity: Goal: Risk for impaired skin integrity will decrease Outcome: Progressing   Problem: Education: Goal: Knowledge of the prescribed therapeutic regimen will improve Outcome: Progressing   Problem: Bowel/Gastric: Goal: Gastrointestinal status for postoperative course will improve Outcome: Progressing   Problem: Cardiac: Goal: Ability to maintain an adequate cardiac output Outcome: Progressing Goal: Will show no evidence of cardiac arrhythmias Outcome: Progressing   Problem: Clinical Measurements: Goal: Ability to maintain clinical measurements within normal limits Outcome: Progressing Goal: Postoperative  complications will be avoided or minimized Outcome: Progressing   Problem: Skin Integrity: Goal: Demonstrates signs of wound healing without infection Outcome: Progressing   Problem: Urinary Elimination: Goal: Will remain free from infection Outcome: Progressing   Problem: Activity: Goal: Ability to tolerate increased activity will improve Outcome: Progressing   Problem: Respiratory: Goal: Ability to maintain adequate ventilation will improve Outcome: Completed/Met Goal: Ability to maintain a clear airway will improve Outcome: Completed/Met   Problem: Education: Goal: Verbalization of understanding the information provided (i.e., activity precautions, restrictions, etc) will improve Outcome: Progressing   Problem: Activity: Goal: Ability to ambulate and perform ADLs will improve Outcome: Progressing   Problem: Clinical Measurements: Goal: Postoperative complications will be avoided or minimized Outcome: Progressing   Problem: Self-Concept: Goal: Ability to maintain and perform role responsibilities to the fullest extent possible will improve Outcome: Progressing   Problem: Pain Management: Goal: Pain level will decrease Outcome: Progressing   "

## 2024-04-04 NOTE — Care Management Important Message (Signed)
 Important Message  Patient Details IM Letter given. Name: FLEM ENDERLE MRN: [id number] Date of Birth: August 22, 1945   Important Message Given:  Yes - Medicare IM     Melba Ates 04/04/2024, 1:36 PM

## 2024-04-04 NOTE — Discharge Summary (Signed)
 " Physician Discharge Summary   Patient: Todd Chapman MRN: [id number] DOB: March 29, 1945  Admit date:     03/30/2024  Discharge date: 04/04/24  Discharge Physician: MDALA-GAUSI, GOLDEN PILLOW   PCP: Charlott Dorn LABOR, MD   Recommendations at discharge:   Follow up with orthopedic surgery  Discharge Diagnoses: Principal Problem:   Closed fracture of right hip, initial encounter [facility]) Active Problems:   Constipation   Persistent atrial fibrillation (HCC)   Hyperlipidemia   Gastro-esophageal reflux disease without esophagitis   Vitamin D deficiency   Sacroiliitis   Pulmonary infiltrates   Hypoxic episode  Resolved Problems:   * No resolved hospital problems. *  Hospital Course: DMARCUS Chapman is a 79 y.o. male with PMH of osteoarthritis, bradycardia, sick sinus syndrome, pacemaker placement, influenza A, hepatitis A, constipation, GERD, diverticulosis, hyperlipidemia, history of multifocal pneumonia, small bowel obstruction, prostate cancer, polypharmacy, rosacea who was brought via EMS from home after waking up with sudden, severe RLE pain associated with spasms after being found on the floor by his wife.  The patient does not have any recollection of what happened. He denies falling.    Right hip x-ray showed comminuted intertrochanteric right hip fracture. Portable CXR showed mild diffuse interstitial opacities, lower lobe predominant, raising concern for multifocal infection/pneumonia.  Patient s/p intramedullary fixation of the right femur on 1/22.  The rest of the hospital course is in problem-based format below:    Assessment and Plan:  Closed fracture of right hip, initial encounter [facility]) Admission x-ray revealed comminuted intertrochanteric right hip fracture Orthopedic surgery was consulted and patient underwent intramedullary fixation of the right femur on 1/22. Seen by PT. SNF recommended.  Orthopedic recommendations: - Touch down weight bearing.  - Continue  pain management.  -Continue home Eliquis  5 mg twice daily - Hold alendronate for 6-12 months.    Anemia Likely acute blood loss anemia due to fracture and surgery.  Hemoglobin 7.0 on 1/25, and 6.9 on 1/26. Patient was transfused on 1/26. Hemoglobin at discharge was 8.3. As patient is on anticoagulation, would recommend rechecking CBC in 1 week.  Chronic hyponatremia Sodium baseline seems to be 130 to 135. Sodium is 130 for past few days. Trended down to 126 on 1/26. Patient admitted to increased fluid intake and did appear hypervolemic. He was given IV Lasix  and fluids were restricted. Sodium was 130 at time of discharge.   Hypoxia, resolved.  Due to pneumonia. Patient was treated with antibiotics and was able to be weaned off oxygen shortly after admission.   Pneumonia Patient presented with hypoxia. Chest x-ray showed infiltrates concerning for pneumonia. Patient completed a 5-day course of antibiotics (ceftriaxone , doxycycline ) on 1/27.   Constipation MiraLAX  and stool softeners.   Persistent atrial fibrillation (HCC) Patient is on apixaban  at home. Low-dose apixaban  (2.5 mg twice daily) was resumed after surgery and then transitioned to his prior home dose of 5 mg twice daily.    Hyperlipidemia Continued rosuvastatin  10 mg p.o. daily.    Gastro-esophageal reflux disease without esophagitis Continued pantoprazole  40 mg p.o. daily.   Sacroiliitis On analgesics.   Alcohol  use disorder Patient presented with blood alcohol  level of 103 mg/dL. No signs of withdrawal during his hospital stay. Alcohol  cessation recommended.       Consultants: Orthopedic surgery Procedures performed: Intramedullary fixation of right femur Disposition: Skilled nursing facility Diet recommendation:  Discharge Diet Orders (From admission, onward)     Start     Ordered   04/04/24 0000  Diet general        04/04/24 1130           Regular diet  DISCHARGE  MEDICATION: Allergies as of 04/04/2024       Reactions   Atorvastatin Palpitations   Ezetimibe Palpitations   Morphine  And Codeine Other (See Comments)   Pt has hypersensitivity to morphine - makes confused after prolonged usage        Medication List     PAUSE taking these medications    alendronate 70 MG tablet Wait to take this until your doctor or other care provider tells you to start again. Commonly known as: FOSAMAX Take 70 mg by mouth once a week.       STOP taking these medications    lidocaine  5 % Commonly known as: LIDODERM    Lidocaine  5 % Crea   multivitamin with minerals Tabs tablet   sodium chloride  0.65 % Soln nasal spray Commonly known as: OCEAN       TAKE these medications    acetaminophen  325 MG tablet Commonly known as: TYLENOL  Take 2 tablets (650 mg total) by mouth every 6 (six) hours.   albuterol  108 (90 Base) MCG/ACT inhaler Commonly known as: VENTOLIN  HFA Inhale 1-2 puffs into the lungs every 6 (six) hours as needed for wheezing or shortness of breath.   apixaban  5 MG Tabs tablet Commonly known as: Eliquis  Take 1 tablet (5 mg total) by mouth 2 (two) times daily. What changed: See the new instructions.   calcium  carbonate 500 MG chewable tablet Commonly known as: TUMS - dosed in mg elemental calcium  Chew 2 tablets by mouth daily as needed for indigestion or heartburn.   docusate sodium  100 MG capsule Commonly known as: COLACE Take 1 capsule (100 mg total) by mouth 2 (two) times daily.   HYDROcodone -acetaminophen  5-325 MG tablet Commonly known as: NORCO/VICODIN Take 1 tablet by mouth every 4 (four) hours as needed for up to 7 days for moderate pain (pain score 4-6) or severe pain (pain score 7-10).   methocarbamol  500 MG tablet Commonly known as: ROBAXIN  Take 1 tablet (500 mg total) by mouth every 6 (six) hours as needed for muscle spasms.   olmesartan 20 MG tablet Commonly known as: BENICAR Take 20 mg by mouth daily.    pantoprazole  40 MG tablet Commonly known as: PROTONIX  Take 40 mg by mouth daily.   polyethylene glycol 17 g packet Commonly known as: MIRALAX  / GLYCOLAX  Take 17 g by mouth daily as needed. What changed:  when to take this reasons to take this   PreserVision AREDS 2 Caps 1 capsule   psyllium 58.6 % packet Commonly known as: METAMUCIL Take 1 packet by mouth daily.   rosuvastatin  10 MG tablet Commonly known as: CRESTOR  Take 10 mg by mouth every morning.   Systane 0.4-0.3 % Gel ophthalmic gel Generic drug: Polyethyl Glycol-Propyl Glycol See admin instructions.   SYSTANE BALANCE OP Place 1 drop into both eyes daily as needed (dry eyes).   thiamine  100 MG tablet Commonly known as: Vitamin B-1 Take 1 tablet (100 mg total) by mouth daily. Start taking on: April 05, 2024   trolamine salicylate 10 % cream Commonly known as: ASPERCREME Apply 1 application  topically daily.   Vitamin D3 125 MCG (5000 UT) Caps 2 capsules               Discharge Care Instructions  (From admission, onward)           Start  Ordered   04/04/24 0000  Leave dressing on - Keep it clean, dry, and intact until clinic visit        04/04/24 1130            Contact information for follow-up providers     [place], Valery RAMAN, PA-C. Schedule an appointment as soon as possible for a visit in 2 week(s).   Specialty: Orthopedic Surgery Why: For suture removal, For wound re-check Contact information: [address] [place] [address] [phone]              Contact information for after-discharge care     Destination     [facility] .   Service: Skilled Nursing Contact information: [address] [place]  [address] [phone]                    Please repeat CBC in 1 week   Discharge Exam: Filed Weights   03/30/24 0101 03/30/24 1454  Weight: 98.4 kg 98.4 kg    General: Alert, oriented X3  Eyes: Pupils equal,  reactive  Oral cavity: moist mucous membranes  Head: Atraumatic, normocephalic  Neck: supple  Chest: clear to auscultation. No crackles, no wheezes  CVS: S1,S2 RRR. No murmurs  Abd: No distention, soft, non-tender. No masses palpable  Extr: Pedal edema  MSK: No joint deformities or swelling  Neurological: Grossly intact.    Condition at discharge: stable  The results of significant diagnostics from this hospitalization (including imaging, microbiology, ancillary and laboratory) are listed below for reference.   Imaging Studies: DG Pelvis Portable Result Date: 03/30/2024 EXAM: 1 or 2 VIEW(S) XRAY OF THE PELVIS 03/30/2024 09:50:18 PM COMPARISON: 03/30/2024 CLINICAL HISTORY: Closed comminuted intertrochanteric fracture of proximal end of right femur. FINDINGS: BONES AND JOINTS: Status post ORIF of a right proximal femoral shaft fracture, mildly displaced. Moderate degenerative changes of left hip. No acute fracture. SOFT TISSUES: Unremarkable. IMPRESSION: 1. Status post ORIF of a right proximal femoral shaft fracture, mildly displaced. Electronically signed by: Pinkie Pebbles MD 03/30/2024 10:58 PM EST RP Workstation: [id number]   DG HIP UNILAT WITH PELVIS 2-3 VIEWS RIGHT Result Date: 03/30/2024 CLINICAL DATA:  ORIF of the right hip. EXAM: DG HIP (WITH OR WITHOUT PELVIS) 2-3V RIGHT COMPARISON:  Right hip radiograph dated 03/30/2024. FINDINGS: Intraoperative fluoroscopic spot images of the right hip provided. The total fluoroscopic time is 1 minute, 8 seconds with a cumulative air Karma of 12 mGy. Status post ORIF of the right femoral fracture. IMPRESSION: Intraoperative fluoroscopic spot images of the right hip. Electronically Signed   By: Vanetta Chou M.D.   On: 03/30/2024 18:17   DG C-Arm 1-60 Min-No Report Result Date: 03/30/2024 Fluoroscopy was utilized by the requesting physician.  No radiographic interpretation.   DG C-Arm 1-60 Min-No Report Result Date: 03/30/2024 Fluoroscopy was  utilized by the requesting physician.  No radiographic interpretation.   DG Knee Right Port Result Date: 03/30/2024 CLINICAL DATA:  Right proximal femur fracture. EXAM: PORTABLE RIGHT KNEE - 1-2 VIEW COMPARISON:  Hip films same day. FINDINGS: Suboptimal positioning of the AP film. The visualized distal femur is otherwise unremarkable. Mild degenerative change of the patellofemoral joint. No significant knee joint effusion. IMPRESSION: 1. No acute findings. 2. Mild degenerative change of the patellofemoral joint. Electronically Signed   By: Toribio Agreste M.D.   On: 03/30/2024 08:39   DG Chest Port 1 View Result Date: 03/30/2024 EXAM: 1 VIEW(S) XRAY OF THE CHEST 03/30/2024 03:06:00 AM COMPARISON:  04/07/2023. CLINICAL HISTORY: hip pain, fracture, preop FINDINGS: LINES, TUBES AND DEVICES: Left chest dual-chamber pacemaker with leads projecting over right atrium and ventricle. LUNGS AND PLEURA: Low lung volumes. Mild diffuse interstitial opacities, lower lung predominant, raising concern for multifocal infection/pneumonia. No pleural effusion. No pneumothorax. HEART AND MEDIASTINUM: Cardiomegaly. Aortic atherosclerotic calcification. BONES AND SOFT TISSUES: No acute osseous abnormality. IMPRESSION: 1. Mild diffuse interstitial opacities, lower lung predominant, raising concern for multifocal infection/pneumonia. 2. Aortic Atherosclerosis (ICD10-I70.0). Electronically signed by: Pinkie Pebbles MD 03/30/2024 03:10 AM EST RP Workstation: [id number]   DG Hip Unilat W or Wo Pelvis 2-3 Views Right Result Date: 03/30/2024 EXAM: 2 or 3 VIEW(S) XRAY OF THE RIGHT HIP 03/30/2024 02:16:16 AM COMPARISON: None available. CLINICAL HISTORY: pain FINDINGS: BONES AND JOINTS: Comminuted intertrochanteric right hip fracture with displaced lesser trochanter fragment, foreshortening with varus angulation. SOFT TISSUES: Brachytherapy seeds overlying the prostate. LUMBAR SPINE: Prior vertebral augmentation at L5. IMPRESSION: 1.  Comminuted intertrochanteric right hip fracture. Electronically signed by: Pinkie Pebbles MD 03/30/2024 02:32 AM EST RP Workstation: [id number]    Microbiology: Results for orders placed or performed during the hospital encounter of 08/23/22  Blood culture (routine x 2)     Status: None   Collection Time: 08/23/22 11:57 AM   Specimen: BLOOD  Result Value Ref Range Status   Specimen Description   Final    BLOOD RIGHT ANTECUBITAL Performed at [facility], [address] [place], [place] [address]    Special Requests   Final    BOTTLES DRAWN AEROBIC AND ANAEROBIC Blood Culture adequate volume Performed at [facility], [address] [place], [place] [address]    Culture   Final    NO GROWTH 5 DAYS Performed at [facility], [address], [place], [place] [address]    Report Status 08/28/2022 FINAL  Final  Blood culture (routine x 2)     Status: None   Collection Time: 08/23/22  1:01 PM   Specimen: BLOOD  Result Value Ref Range Status   Specimen Description   Final    BLOOD LEFT ANTECUBITAL Performed at [facility], [address], [place], [place] [address]    Special Requests   Final    BOTTLES DRAWN AEROBIC AND ANAEROBIC Blood Culture adequate volume Performed at [facility], [address], [place], [place] [address]    Culture   Final    NO GROWTH 5 DAYS Performed at [facility], [address] [place], [place] [address]    Report Status 08/28/2022 FINAL  Final    Labs: CBC: Recent Labs  Lab 03/30/24 0244 04/01/24 0315 04/02/24 0337 04/03/24 0321 04/03/24 1259 04/04/24 0339  WBC 5.3 7.0 5.6 6.1  --  5.2  NEUTROABS 3.8  --   --   --   --   --   HGB 10.2* 7.2* 7.0* 6.9* 8.9* 8.3*  HCT 31.7* 23.4* 21.7* 20.9* 26.1* 25.2*  MCV 94.1 95.9 93.1 92.5  --  91.6  PLT 193 152 144* 168  --  199   Basic Metabolic Panel: Recent Labs  Lab 03/30/24 0244 03/31/24 0334  04/01/24 0315 04/02/24 0337 04/03/24 0321 04/04/24 0339  NA 135  --  130* 130* 126* 130*  K 4.0  --  4.8 4.2 4.4 4.7  CL 100  --  99 99 96* 95*  CO2 24  --  24 24 22 25   GLUCOSE 127*  --  126* 123* 124* 119*  BUN  15  --  18 14 16 14   CREATININE 0.83  --  0.66 0.62 0.66 0.59*  CALCIUM  9.9  --  9.4 9.4 9.8 10.3  MG  --  1.9  --   --   --   --    Liver Function Tests: No results for input(s): AST, ALT, ALKPHOS, BILITOT, PROT, ALBUMIN  in the last 168 hours. CBG: No results for input(s): GLUCAP in the last 168 hours.  Discharge time spent: greater than 30 minutes.  Signed: MDALA-GAUSI, Delmar Dondero AGATHA, MD [facility] 04/04/2024 "

## 2024-05-09 ENCOUNTER — Ambulatory Visit: Payer: Medicare Other
